# Patient Record
Sex: Male | Born: 1950 | Race: White | Hispanic: No | Marital: Married | Smoking: Former smoker
Health system: Southern US, Community
[De-identification: ages and names within clinical notes are randomized; demographics above are authoritative.]

## PROBLEM LIST (undated history)

## (undated) DIAGNOSIS — H189 Unspecified disorder of cornea: Secondary | ICD-10-CM

## (undated) DIAGNOSIS — N289 Disorder of kidney and ureter, unspecified: Secondary | ICD-10-CM

## (undated) DIAGNOSIS — G4730 Sleep apnea, unspecified: Secondary | ICD-10-CM

## (undated) DIAGNOSIS — I4891 Unspecified atrial fibrillation: Secondary | ICD-10-CM

## (undated) DIAGNOSIS — IMO0001 Reserved for inherently not codable concepts without codable children: Secondary | ICD-10-CM

## (undated) DIAGNOSIS — Z8614 Personal history of Methicillin resistant Staphylococcus aureus infection: Secondary | ICD-10-CM

## (undated) DIAGNOSIS — I509 Heart failure, unspecified: Secondary | ICD-10-CM

## (undated) DIAGNOSIS — J449 Chronic obstructive pulmonary disease, unspecified: Secondary | ICD-10-CM

## (undated) DIAGNOSIS — K8020 Calculus of gallbladder without cholecystitis without obstruction: Secondary | ICD-10-CM

## (undated) DIAGNOSIS — E7800 Pure hypercholesterolemia, unspecified: Secondary | ICD-10-CM

## (undated) DIAGNOSIS — I10 Essential (primary) hypertension: Secondary | ICD-10-CM

## (undated) DIAGNOSIS — F449 Dissociative and conversion disorder, unspecified: Secondary | ICD-10-CM

## (undated) DIAGNOSIS — K219 Gastro-esophageal reflux disease without esophagitis: Secondary | ICD-10-CM

## (undated) DIAGNOSIS — F319 Bipolar disorder, unspecified: Secondary | ICD-10-CM

## (undated) HISTORY — PX: CATARACT EXTRACTION: SUR2

## (undated) HISTORY — PX: CERVICAL FUSION: SHX112

## (undated) HISTORY — PX: KIDNEY STONE SURGERY: SHX686

## (undated) HISTORY — PX: CHOLECYSTECTOMY: SHX55

## (undated) HISTORY — PX: KNEE ARTHROSCOPY: SUR90

---

## 2009-09-04 ENCOUNTER — Encounter (INDEPENDENT_AMBULATORY_CARE_PROVIDER_SITE_OTHER): Payer: Self-pay | Admitting: Internal Medicine

## 2009-09-04 ENCOUNTER — Ambulatory Visit: Payer: Self-pay | Admitting: Vascular Surgery

## 2009-09-05 ENCOUNTER — Ambulatory Visit: Payer: Self-pay | Admitting: Physical Medicine & Rehabilitation

## 2009-10-21 ENCOUNTER — Emergency Department (HOSPITAL_COMMUNITY): Admission: EM | Admit: 2009-10-21 | Discharge: 2009-10-21 | Payer: Self-pay | Admitting: Emergency Medicine

## 2010-05-23 ENCOUNTER — Inpatient Hospital Stay (HOSPITAL_COMMUNITY): Admission: EM | Admit: 2010-05-23 | Discharge: 2009-09-06 | Payer: Self-pay | Admitting: Emergency Medicine

## 2010-09-03 LAB — CBC
HCT: 45.6 % (ref 39.0–52.0)
Hemoglobin: 15.9 g/dL (ref 13.0–17.0)
MCV: 88.1 fL (ref 78.0–100.0)
RBC: 5.18 MIL/uL (ref 4.22–5.81)
WBC: 12.6 10*3/uL — ABNORMAL HIGH (ref 4.0–10.5)

## 2010-09-03 LAB — POCT I-STAT, CHEM 8
Chloride: 103 mEq/L (ref 96–112)
HCT: 49 % (ref 39.0–52.0)
Hemoglobin: 16.7 g/dL (ref 13.0–17.0)

## 2010-09-03 LAB — DIFFERENTIAL
Basophils Absolute: 0 10*3/uL (ref 0.0–0.1)
Basophils Relative: 0 % (ref 0–1)
Eosinophils Absolute: 0.1 10*3/uL (ref 0.0–0.7)
Eosinophils Relative: 1 % (ref 0–5)
Neutrophils Relative %: 79 % — ABNORMAL HIGH (ref 43–77)

## 2010-09-03 LAB — ETHANOL: Alcohol, Ethyl (B): <5 mg/dL (ref 0–10)

## 2010-09-03 LAB — SALICYLATE LEVEL: Salicylate Lvl: <4 mg/dL (ref 2.8–20.0)

## 2010-09-03 LAB — GLUCOSE, CAPILLARY

## 2010-09-09 LAB — IRON AND TIBC
Iron: 80 ug/dL (ref 42–135)
TIBC: 345 ug/dL (ref 215–435)

## 2010-09-09 LAB — ETHANOL: Alcohol, Ethyl (B): <5 mg/dL (ref 0–10)

## 2010-09-09 LAB — HEMOGLOBIN A1C
Hgb A1c MFr Bld: 10 % — ABNORMAL HIGH (ref 4.6–6.1)
Mean Plasma Glucose: 240 mg/dL

## 2010-09-09 LAB — DIFFERENTIAL
Basophils Relative: 0 % (ref 0–1)
Eosinophils Absolute: 0.3 10*3/uL (ref 0.0–0.7)
Eosinophils Relative: 3 % (ref 0–5)
Lymphocytes Relative: 19 % (ref 12–46)
Lymphs Abs: 1.9 10*3/uL (ref 0.7–4.0)
Monocytes Relative: 6 % (ref 3–12)
Neutro Abs: 7.4 10*3/uL (ref 1.7–7.7)
Neutrophils Relative %: 72 % (ref 43–77)

## 2010-09-09 LAB — GLUCOSE, CAPILLARY
Glucose-Capillary: 126 mg/dL — ABNORMAL HIGH (ref 70–99)
Glucose-Capillary: 162 mg/dL — ABNORMAL HIGH (ref 70–99)
Glucose-Capillary: 216 mg/dL — ABNORMAL HIGH (ref 70–99)
Glucose-Capillary: 219 mg/dL — ABNORMAL HIGH (ref 70–99)
Glucose-Capillary: 246 mg/dL — ABNORMAL HIGH (ref 70–99)
Glucose-Capillary: 254 mg/dL — ABNORMAL HIGH (ref 70–99)
Glucose-Capillary: 284 mg/dL — ABNORMAL HIGH (ref 70–99)
Glucose-Capillary: 313 mg/dL — ABNORMAL HIGH (ref 70–99)
Glucose-Capillary: 341 mg/dL — ABNORMAL HIGH (ref 70–99)

## 2010-09-09 LAB — URINALYSIS, ROUTINE W REFLEX MICROSCOPIC
Bilirubin Urine: NEGATIVE
Glucose, UA: NEGATIVE mg/dL
Hgb urine dipstick: NEGATIVE
Nitrite: NEGATIVE
Protein, ur: NEGATIVE mg/dL

## 2010-09-09 LAB — RETICULOCYTES: Retic Count, Absolute: 59.9 10*3/uL (ref 19.0–186.0)

## 2010-09-09 LAB — COMPREHENSIVE METABOLIC PANEL
ALT: 30 U/L (ref 0–53)
AST: 19 U/L (ref 0–37)
CO2: 24 mEq/L (ref 19–32)
Calcium: 8.9 mg/dL (ref 8.4–10.5)
Creatinine, Ser: 0.87 mg/dL (ref 0.4–1.5)
GFR calc Af Amer: >60 mL/min (ref >60)
GFR calc non Af Amer: >60 mL/min (ref >60)
Sodium: 143 mEq/L (ref 135–145)
Total Protein: 6.9 g/dL (ref 6.0–8.3)

## 2010-09-09 LAB — HOMOCYSTEINE
Homocysteine: 6.1 umol/L (ref 4.0–15.4)
Homocysteine: 8.2 umol/L (ref 4.0–15.4)

## 2010-09-09 LAB — VITAMIN B12: Vitamin B-12: 510 pg/mL (ref 211–911)

## 2010-09-09 LAB — CK TOTAL AND CKMB (NOT AT ARMC)
CK, MB: 1.1 ng/mL (ref 0.3–4.0)
CK, MB: 1.2 ng/mL (ref 0.3–4.0)
Relative Index: INVALID (ref 0.0–2.5)
Relative Index: INVALID (ref 0.0–2.5)
Total CK: 43 U/L (ref 7–232)
Total CK: 43 U/L (ref 7–232)
Total CK: 49 U/L (ref 7–232)

## 2010-09-09 LAB — CBC
Hemoglobin: 15.3 g/dL (ref 13.0–17.0)
MCHC: 34 g/dL (ref 30.0–36.0)
MCV: 89.3 fL (ref 78.0–100.0)
Platelets: 292 10*3/uL (ref 150–400)
WBC: 8.5 10*3/uL (ref 4.0–10.5)

## 2010-09-09 LAB — BASIC METABOLIC PANEL
BUN: 11 mg/dL (ref 6–23)
CO2: 25 mEq/L (ref 19–32)
Calcium: 8.7 mg/dL (ref 8.4–10.5)
Chloride: 107 mEq/L (ref 96–112)
Creatinine, Ser: 0.84 mg/dL (ref 0.4–1.5)

## 2010-09-09 LAB — POCT I-STAT, CHEM 8
Chloride: 109 mEq/L (ref 96–112)
Glucose, Bld: 124 mg/dL — ABNORMAL HIGH (ref 70–99)
Hemoglobin: 15.3 g/dL (ref 13.0–17.0)
Potassium: 3.6 mEq/L (ref 3.5–5.1)

## 2010-09-09 LAB — TROPONIN I
Troponin I: 0.01 ng/mL (ref 0.00–0.06)
Troponin I: <0.01 ng/mL (ref 0.00–0.06)

## 2010-09-09 LAB — APTT: aPTT: 28 seconds (ref 24–37)

## 2010-09-09 LAB — LIPID PANEL: Cholesterol: 124 mg/dL (ref 0–200)

## 2010-09-09 LAB — POCT CARDIAC MARKERS

## 2011-03-17 ENCOUNTER — Emergency Department (INDEPENDENT_AMBULATORY_CARE_PROVIDER_SITE_OTHER): Payer: Managed Care, Other (non HMO)

## 2011-03-17 ENCOUNTER — Encounter: Payer: Self-pay | Admitting: Family Medicine

## 2011-03-17 ENCOUNTER — Emergency Department (HOSPITAL_BASED_OUTPATIENT_CLINIC_OR_DEPARTMENT_OTHER)
Admission: EM | Admit: 2011-03-17 | Discharge: 2011-03-17 | Disposition: A | Discharge status: Home / Self Care | Payer: Managed Care, Other (non HMO) | Attending: Emergency Medicine | Admitting: Emergency Medicine

## 2011-03-17 DIAGNOSIS — Z794 Long term (current) use of insulin: Secondary | ICD-10-CM | POA: Insufficient documentation

## 2011-03-17 DIAGNOSIS — I10 Essential (primary) hypertension: Secondary | ICD-10-CM | POA: Insufficient documentation

## 2011-03-17 DIAGNOSIS — Y92009 Unspecified place in unspecified non-institutional (private) residence as the place of occurrence of the external cause: Secondary | ICD-10-CM | POA: Insufficient documentation

## 2011-03-17 DIAGNOSIS — E119 Type 2 diabetes mellitus without complications: Secondary | ICD-10-CM | POA: Insufficient documentation

## 2011-03-17 DIAGNOSIS — W010XXA Fall on same level from slipping, tripping and stumbling without subsequent striking against object, initial encounter: Secondary | ICD-10-CM | POA: Insufficient documentation

## 2011-03-17 DIAGNOSIS — M25569 Pain in unspecified knee: Secondary | ICD-10-CM | POA: Insufficient documentation

## 2011-03-17 DIAGNOSIS — G4730 Sleep apnea, unspecified: Secondary | ICD-10-CM | POA: Insufficient documentation

## 2011-03-17 DIAGNOSIS — S62109A Fracture of unspecified carpal bone, unspecified wrist, initial encounter for closed fracture: Secondary | ICD-10-CM

## 2011-03-17 DIAGNOSIS — W19XXXA Unspecified fall, initial encounter: Secondary | ICD-10-CM

## 2011-03-17 DIAGNOSIS — M25539 Pain in unspecified wrist: Secondary | ICD-10-CM

## 2011-03-17 HISTORY — DX: Dissociative and conversion disorder, unspecified: F44.9

## 2011-03-17 HISTORY — DX: Essential (primary) hypertension: I10

## 2011-03-17 HISTORY — DX: Unspecified disorder of cornea: H18.9

## 2011-03-17 HISTORY — DX: Bipolar disorder, unspecified: F31.9

## 2011-03-17 HISTORY — DX: Sleep apnea, unspecified: G47.30

## 2011-03-17 MED ORDER — HYDROMORPHONE HCL 1 MG/ML IJ SOLN
1.0000 mg | Freq: Once | INTRAMUSCULAR | Status: AC
  Administered 2011-03-17: 1 mg via INTRAMUSCULAR
  Filled 2011-03-17: qty 1

## 2011-03-17 MED ORDER — OXYCODONE-ACETAMINOPHEN 5-325 MG PO TABS: 2.0000 | ORAL_TABLET | ORAL | Status: AC | PRN

## 2011-03-17 NOTE — ED Notes (Signed)
Pt sts he slipped in "oil in carport today and fell". Pt c/o right wrist pain, right shoulder and arm pain. Pt denies loc.

## 2011-03-17 NOTE — ED Provider Notes (Addendum)
History   Chart scribed for Raeford Razor, MD by Enos Fling; the patient was seen in room MH08/MH08; this patient's care was started at 6:26 PM.    CSN: [id number] Arrival date & time: 03/17/2011  6:23 PM  Chief Complaint  Patient presents with  . Fall   HPI Mike Hunter is a 60 y.o. male who presents to the Emergency Department s/p fall. Pt reports he slipped and fell on oil today a brief time pta while outside in his carport. Pt c/o persistent "bad" right wrist pain, radiating up his arm to his shoulder, worse with movement and palpation. Also c/o pain to left knee that is worse with walking. No numbness, tingling, or weakness. No head injury or LOC with fall. Denies headache, neck pain, back pain, dizziness, or abd pain. Pt has not taken any pain meds.    Past Medical History  Diagnosis Date  . Diabetes mellitus   . Hypertension   . Sleep apnea   . Asthma   . Bipolar affective   . Conversion disorder   . Cornea disorder   . Cataract     Past Surgical History  Procedure Date  . Knee arthroscopy   . Cholecystectomy   . Kidney stone surgery     No family history on file.  History  Substance Use Topics  . Smoking status: Never Smoker   . Smokeless tobacco: Not on file  . Alcohol Use: No      Review of Systems 10 Systems reviewed and are negative for acute change except as noted in the HPI.  Allergies  Codeine and Morphine and related  Home Medications   Current Outpatient Rx  Name Route Sig Dispense Refill  . INSULIN ASPART 100 UNIT/ML [place] SOLN Subcutaneous Inject 15-35 Units into the skin 3 (three) times daily before meals. Use sliding scale     . INSULIN GLARGINE 100 UNIT/ML [place] SOLN Subcutaneous Inject 45-50 Units into the skin 2 (two) times daily. Give 50 units in the morning and 45 units at supper time     . METFORMIN HCL 1000 MG PO TABS Oral Take 1,000 mg by mouth 2 (two) times daily with a meal.        BP 147/83  Pulse 96  Temp(Src) 98.1 F (36.7  C) (Oral)  Resp 18  Ht 5\' 11"  (1.803 m)  Wt 280 lb (127.007 kg)  BMI 39.05 kg/m2  SpO2 100%  Physical Exam  Nursing note and vitals reviewed. Constitutional: He is oriented to person, place, and time. He appears well-developed and well-nourished. No distress.  HENT:  Head: Normocephalic and atraumatic.  Mouth/Throat: Mucous membranes are normal.  Eyes: Conjunctivae are normal.  Neck: Normal range of motion. Neck supple.  Cardiovascular: Normal rate, regular rhythm and intact distal pulses.  Exam reveals no gallop and no friction rub.   No murmur heard. Pulmonary/Chest: Effort normal and breath sounds normal. He has no wheezes. He has no rales.  Abdominal: Soft. There is no tenderness.  Musculoskeletal: Normal range of motion. He exhibits no edema.       Subjective tenderness diffusely to right hand and wrist but nontender while distracted, uses right hand to support himself when moving on the bed, no significant swelling to right hand or wrist; Left knee, right elbow, and right shoulder nontender with no obvious swelling or ecchymosis; FROM, and NVI all extremities  Neurological: He is alert and oriented to person, place, and time.  Skin: Skin is warm and dry.  No rash noted.  Psychiatric: He has a normal mood and affect.    ED Course  Procedures - none  Dg Wrist Complete Right  03/17/2011  *RADIOLOGY REPORT*  Clinical Data: Fall, wrist pain  RIGHT WRIST - COMPLETE 3+ VIEW  Comparison: 03/17/2011  Findings: Intact distal radius and ulna.  Normal wrist alignment. On the lateral view, there is dorsal soft tissue swelling with a small bone fragment in the wrist region consistent with a avulsion fracture from the dorsal surface of the carpal bones, suspect from the triquetrum.  IMPRESSION: Dorsal carpal bone small avulsion fracture with localized soft tissue swelling, suspect from the triquetrum.  Original Report Authenticated By: Judie Petit. Ruel Favors, M.D.   Dg Hand Complete  Right  03/17/2011  *RADIOLOGY REPORT*  Clinical Data: Fall, right hand pain, swelling  RIGHT HAND - COMPLETE 3+ VIEW  Comparison: 03/17/2011  Findings: Slight arthritic changes of the right fifth PIP joint. Normal alignment.  Metacarpals and phalanges appear intact.  Distal radius and ulna intact. On the lateral view, there is a small bone fragment dorsally at the wrist with localized soft tissue swelling suspicious for a small avulsion fracture from the dorsal surface of one of the carpal bones, suspect from the triquetrum.  IMPRESSION: Small acute avulsion fracture dorsally at the wrist involving the carpal bones, suspect from the triquetrum.  Original Report Authenticated By: Judie Petit. Ruel Favors, M.D.    OTHER DATA REVIEWED: Nursing notes and vital signs reviewed. Prior records reviewed.  All results reviewed and discussed, questions answered, pt agreeable with plan.   MDM  60yM with R hand/wrist pain after fall. Avulsion fx of carpal bone, likely triquetrum. Physical exam consistent with that location. Will place in thumb spica and refer to ortho.  IMPRESSION: No diagnosis found.  SCRIBE ATTESTATION: I personally preformed the services scribed in my presence. The recorded information has been reviewed and considered. Raeford Razor, MD.        Raeford Razor, MD 03/17/11 [id number]  Raeford Razor, MD 03/17/11 [phone]

## 2011-05-13 ENCOUNTER — Emergency Department (HOSPITAL_BASED_OUTPATIENT_CLINIC_OR_DEPARTMENT_OTHER)
Admission: EM | Admit: 2011-05-13 | Discharge: 2011-05-13 | Disposition: A | Discharge status: Home / Self Care | Payer: Managed Care, Other (non HMO) | Attending: Emergency Medicine | Admitting: Emergency Medicine

## 2011-05-13 ENCOUNTER — Encounter (HOSPITAL_BASED_OUTPATIENT_CLINIC_OR_DEPARTMENT_OTHER): Payer: Self-pay | Admitting: *Deleted

## 2011-05-13 ENCOUNTER — Emergency Department (HOSPITAL_BASED_OUTPATIENT_CLINIC_OR_DEPARTMENT_OTHER)
Admission: EM | Admit: 2011-05-13 | Discharge: 2011-05-14 | Disposition: A | Discharge status: Other Acute Inpatient Hospital | Payer: Managed Care, Other (non HMO) | Attending: Emergency Medicine | Admitting: Emergency Medicine

## 2011-05-13 ENCOUNTER — Encounter (HOSPITAL_BASED_OUTPATIENT_CLINIC_OR_DEPARTMENT_OTHER): Payer: Self-pay | Admitting: Emergency Medicine

## 2011-05-13 DIAGNOSIS — I10 Essential (primary) hypertension: Secondary | ICD-10-CM | POA: Insufficient documentation

## 2011-05-13 DIAGNOSIS — G4730 Sleep apnea, unspecified: Secondary | ICD-10-CM | POA: Insufficient documentation

## 2011-05-13 DIAGNOSIS — J45909 Unspecified asthma, uncomplicated: Secondary | ICD-10-CM | POA: Insufficient documentation

## 2011-05-13 DIAGNOSIS — F319 Bipolar disorder, unspecified: Secondary | ICD-10-CM | POA: Insufficient documentation

## 2011-05-13 DIAGNOSIS — E119 Type 2 diabetes mellitus without complications: Secondary | ICD-10-CM | POA: Insufficient documentation

## 2011-05-13 DIAGNOSIS — I213 ST elevation (STEMI) myocardial infarction of unspecified site: Secondary | ICD-10-CM

## 2011-05-13 DIAGNOSIS — R040 Epistaxis: Secondary | ICD-10-CM | POA: Insufficient documentation

## 2011-05-13 DIAGNOSIS — I219 Acute myocardial infarction, unspecified: Secondary | ICD-10-CM | POA: Insufficient documentation

## 2011-05-13 HISTORY — DX: Calculus of gallbladder without cholecystitis without obstruction: K80.20

## 2011-05-13 HISTORY — DX: Reserved for inherently not codable concepts without codable children: IMO0001

## 2011-05-13 HISTORY — DX: Gastro-esophageal reflux disease without esophagitis: K21.9

## 2011-05-13 MED ORDER — OXYMETAZOLINE HCL 0.05 % NA SOLN
1.0000 | Freq: Once | NASAL | Status: AC
  Administered 2011-05-14: 1 via NASAL
  Filled 2011-05-13: qty 15

## 2011-05-13 MED ORDER — OXYMETAZOLINE HCL 0.05 % NA SOLN
NASAL | Status: AC
  Filled 2011-05-13: qty 15

## 2011-05-13 NOTE — ED Notes (Signed)
Nose bleed started this am at approximately 0300.  Patient states he has a past history of same.  Uses cpap at night for severe sleep apnea.  Moderate amount of bleeding from right nare.

## 2011-05-13 NOTE — ED Notes (Signed)
Pt has bleeding from right nare. Pt has nasal tampon in right nare from here this morning.

## 2011-05-13 NOTE — ED Provider Notes (Signed)
History     CSN: [id number] Arrival date & time: 05/13/2011  7:51 AM   First MD Initiated Contact with Patient 05/13/11 0801      No chief complaint on file.   (Consider location/radiation/quality/duration/timing/severity/associated sxs/prior treatment) HPI Comments: Patient denies any nasal pain, shortness of breath, or headache. Patient states he has had nasal bleeds like this in the past but this is the worst it has never bled for this long.  Patient is a 60 y.o. male presenting with nosebleeds. The history is provided by the patient.  Epistaxis  This is a recurrent problem. The current episode started 3 to 5 hours ago. The problem occurs constantly. The problem has not changed since onset.The problem is associated with an unknown (States he did wear his CPAP mask last night in the humidifier was not attached) factor. The bleeding has been from the right nare. Treatments tried: Patient has tried applying pressure and packing his nose with some gauze without any improving. The treatment provided no relief.    Past Medical History  Diagnosis Date  . Diabetes mellitus   . Hypertension   . Sleep apnea   . Asthma   . Bipolar affective   . Conversion disorder   . Cornea disorder   . Cataract     Past Surgical History  Procedure Date  . Knee arthroscopy   . Cholecystectomy   . Kidney stone surgery     No family history on file.  History  Substance Use Topics  . Smoking status: Never Smoker   . Smokeless tobacco: Not on file  . Alcohol Use: No      Review of Systems  Constitutional: Negative for fever.  HENT: Positive for ear pain and nosebleeds.   Respiratory: Negative for cough and shortness of breath.   Gastrointestinal: Negative for nausea.  Neurological: Negative for weakness.  Hematological: Does not bruise/bleed easily.  All other systems reviewed and are negative.    Allergies  Codeine and Morphine and related  Home Medications   Current Outpatient  Rx  Name Route Sig Dispense Refill  . FUROSEMIDE 20 MG PO TABS Oral Take 20 mg by mouth daily.      . INSULIN ASPART 100 UNIT/ML [place] SOLN Subcutaneous Inject 15-35 Units into the skin 3 (three) times daily before meals. Use sliding scale     . INSULIN GLARGINE 100 UNIT/ML [place] SOLN Subcutaneous Inject 45-50 Units into the skin 2 (two) times daily. Give 50 units in the morning and 45 units at supper time     . L-METHYLFOLATE CALCIUM 7.5 MG PO TABS Oral Take 1 tablet by mouth daily.      Marland Kitchen LANSOPRAZOLE 30 MG PO CPDR Oral Take 30 mg by mouth 2 (two) times daily.      Marland Kitchen LATANOPROST 0.005 % OP SOLN Both Eyes Place 1 drop into both eyes at bedtime.      Marland Kitchen METFORMIN HCL 1000 MG PO TABS Oral Take 1,000 mg by mouth 2 (two) times daily with a meal.      . MONTELUKAST SODIUM 10 MG PO TABS Oral Take 10 mg by mouth at bedtime.      . OXYBUTYNIN CHLORIDE ER 10 MG PO TB24 Oral Take 10 mg by mouth daily.      Marland Kitchen PENTOSAN POLYSULFATE SODIUM 100 MG PO CAPS Oral Take 200 mg by mouth 2 (two) times daily.      Marland Kitchen VILAZODONE HCL 40 MG PO TABS Oral Take 1 tablet by mouth  daily.        There were no vitals taken for this visit.  Physical Exam  Nursing note and vitals reviewed. Constitutional: He is oriented to person, place, and time. He appears well-developed and well-nourished. He appears distressed.  HENT:  Head: Normocephalic and atraumatic.  Right Ear: Tympanic membrane and ear canal normal.  Left Ear: Tympanic membrane and ear canal normal.  Nose: Epistaxis is observed.       Right nare with fresh clots in oozing blood. No bleeding from the left nare.  Blood in the posterior pharynx.  Eyes: Conjunctivae and EOM are normal. Pupils are equal, round, and reactive to light.  Neck: Normal range of motion. Neck supple.  Cardiovascular: Normal rate, normal heart sounds and intact distal pulses.   No murmur heard. Pulmonary/Chest: Breath sounds normal. No respiratory distress. He has no wheezes. He has no rales.    Musculoskeletal: He exhibits no tenderness.  Neurological: He is alert and oriented to person, place, and time.  Skin: Skin is warm and dry. No rash noted. No erythema. No pallor.  Psychiatric: He has a normal mood and affect. His behavior is normal.    ED Course  EPISTAXIS MANAGEMENT Date/Time: 05/13/2011 8:08 AM Performed by: Gwyneth Sprout Authorized by: Gwyneth Sprout Consent: The procedure was performed in an emergent situation. Required items: required blood products, implants, devices, and special equipment available Patient identity confirmed: verbally with patient Patient sedated: no Treatment site: right anterior Repair method: nasal balloon (7.5 Rhino Rocket) Post-procedure assessment: bleeding stopped Treatment complexity: simple Patient tolerance: Patient tolerated the procedure well with no immediate complications.   (including critical care time)  Labs Reviewed - No data to display No results found.   No diagnosis found.    MDM   Patient presented with a nosebleed. He states he has had nosebleeds in the past recently he was seen by ENT 2 weeks ago and had right nare are cauterized.  Here patient has profuse bleeding out of the right side of his nose after removing the clots I blowing his nose patient's nare was packed with a 7.5 Rhino Rocket.  Patient is not on any anticoagulation and he was observed for 20-30 minutes without any further bleeding. Will have patient followup with his ENT for packing removal and further management.        Gwyneth Sprout, MD 05/13/11 [phone]

## 2011-05-14 LAB — CBC
HCT: 36.5 % — ABNORMAL LOW (ref 39.0–52.0)
Hemoglobin: 12.4 g/dL — ABNORMAL LOW (ref 13.0–17.0)
MCV: 86.7 fL (ref 78.0–100.0)
RBC: 4.21 MIL/uL — ABNORMAL LOW (ref 4.22–5.81)
WBC: 9 10*3/uL (ref 4.0–10.5)

## 2011-05-14 LAB — BASIC METABOLIC PANEL
BUN: 18 mg/dL (ref 6–23)
CO2: 20 mEq/L (ref 19–32)
Chloride: 105 mEq/L (ref 96–112)
Creatinine, Ser: 0.7 mg/dL (ref 0.50–1.35)
Potassium: 3.9 mEq/L (ref 3.5–5.1)

## 2011-05-14 MED ORDER — HEPARIN SOD (PORCINE) IN D5W 100 UNIT/ML IV SOLN
INTRAVENOUS | Status: AC
  Filled 2011-05-14: qty 250

## 2011-05-14 MED ORDER — MORPHINE SULFATE 2 MG/ML IJ SOLN
INTRAMUSCULAR | Status: AC
  Administered 2011-05-14: 2 mg via INTRAVENOUS
  Filled 2011-05-14: qty 1

## 2011-05-14 MED ORDER — ONDANSETRON HCL 4 MG/2ML IJ SOLN
4.0000 mg | Freq: Once | INTRAMUSCULAR | Status: AC
  Administered 2011-05-14: 4 mg via INTRAVENOUS
  Filled 2011-05-14: qty 2

## 2011-05-14 MED ORDER — HEPARIN BOLUS VIA INFUSION
4000.0000 [IU] | Freq: Once | INTRAVENOUS | Status: DC
  Filled 2011-05-14: qty 4000

## 2011-05-14 MED ORDER — HEPARIN SODIUM (PORCINE) 5000 UNIT/ML IJ SOLN
INTRAMUSCULAR | Status: AC
  Administered 2011-05-14: 4000 [IU]
  Filled 2011-05-14: qty 1

## 2011-05-14 MED ORDER — SODIUM CHLORIDE 0.9 % IV SOLN
INTRAVENOUS | Status: DC
  Administered 2011-05-14: via INTRAVENOUS

## 2011-05-14 MED ORDER — MORPHINE SULFATE 4 MG/ML IJ SOLN
INTRAMUSCULAR | Status: AC
  Administered 2011-05-14: 4 mg
  Filled 2011-05-14: qty 1

## 2011-05-14 MED ORDER — ASPIRIN 325 MG PO TABS
325.0000 mg | ORAL_TABLET | Freq: Once | ORAL | Status: AC
  Administered 2011-05-14: 325 mg via ORAL
  Filled 2011-05-14: qty 1

## 2011-05-14 NOTE — ED Notes (Addendum)
Dr Patria Mane notified of changes in pain status.  Repeat 12 lead ordered

## 2011-05-14 NOTE — ED Notes (Signed)
Pt was placed on a Ventri mask of 40% per nose bleed (packing placed in nose) and some active CP. Pt also had multiple EKG strips taken to assess the CP during the nose bleed event. Suction was placed at bedside.

## 2011-05-14 NOTE — ED Notes (Signed)
Pt presents to ED today c/o nose bleed.  Pt was here earlier for same and treateda nd sent home.  Pt has profuse bleeding from both nares.  Dr Patria Mane at bedside

## 2011-05-14 NOTE — ED Notes (Addendum)
IV remain indwelling for transport to [facility],  Charted as removed for [facility].

## 2011-05-14 NOTE — ED Provider Notes (Signed)
History     CSN: [id number] Arrival date & time: 05/13/2011 11:47 PM   First MD Initiated Contact with Patient 05/13/11 2348      Chief Complaint  Patient presents with  . Epistaxis    (Consider location/radiation/quality/duration/timing/severity/associated sxs/prior treatment) The history is provided by the patient and the spouse.   the patient reports he did develop nosebleed from his right side earlier this morning which point he was seen in the emergency department and they Rhino Rocket was placed in his right nares to control his bleeding.  He reports a call is her nose and throat surgeon however followup with [facility] range until Friday.  Patient reported his bleeding then began again in approximately 4 this afternoon.  He reports his been intermittent since then he feels as though the blood running down the back of his throat.  His been nauseated however has not vomited.  He also reports intermittent chest tightness across his anterior chest without radiation that began tonight abruptly around 6 PM.  This is occurred intermittently since then and is now severe.  He reports associated shortness of breath and nausea.  His had no diaphoresis.  He reports multiple heart catheterizations given his risk factors who reports he does have coronary artery disease however there is never been enough to stent.  He reports that recently he has developed "angina" with some chest pain with exertion.  At time of evaluation after nosebleed was controlled he reported 6/10 chest discomfort described as a tightness and pressure as if someone had a fist in his chest.   Past Medical History  Diagnosis Date  . Diabetes mellitus   . Hypertension   . Sleep apnea   . Asthma   . Bipolar affective   . Conversion disorder   . Cornea disorder   . Cataract   . Reflux   . 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 Knee arthroscopy   . Cholecystectomy   . Kidney stone  surgery     No family history on file.  History  Substance Use Topics  . Smoking status: Former Games developer  . Smokeless tobacco: Not on file  . Alcohol Use: No      Review of Systems  HENT: Positive for nosebleeds.   All other systems reviewed and are negative.    Allergies  Codeine and Morphine and related  Home Medications   Current Outpatient Rx  Name Route Sig Dispense Refill  . FUROSEMIDE 20 MG PO TABS Oral Take 20 mg by mouth daily.      . INSULIN ASPART 100 UNIT/ML [place] SOLN Subcutaneous Inject 15-35 Units into the skin 3 (three) times daily before meals. Use sliding scale     . INSULIN GLARGINE 100 UNIT/ML [place] SOLN Subcutaneous Inject 45-50 Units into the skin 2 (two) times daily. Give 50 units in the morning and 45 units at supper time     . L-METHYLFOLATE CALCIUM 7.5 MG PO TABS Oral Take 1 tablet by mouth daily.     Marland Kitchen LANSOPRAZOLE 30 MG PO CPDR Oral Take 30 mg by mouth 2 (two) times daily.      Marland Kitchen LATANOPROST 0.005 % [place] SOLN Both Eyes Place 1 drop into both eyes at bedtime.      Marland Kitchen METFORMIN HCL 1000 MG PO TABS Oral Take 1,000 mg by mouth 2 (two) times daily with a meal.      . MONTELUKAST SODIUM  10 MG PO TABS Oral Take 10 mg by mouth at bedtime.      . OXYBUTYNIN CHLORIDE ER 10 MG PO TB24 Oral Take 10 mg by mouth daily.      Marland Kitchen PENTOSAN POLYSULFATE SODIUM 100 MG PO CAPS Oral Take 200 mg by mouth 2 (two) times daily.      Marland Kitchen VILAZODONE HCL 40 MG PO TABS Oral Take 1 tablet by mouth daily.        BP 157/87  Pulse 97  Temp(Src) 97.4 F (36.3 C) (Oral)  Resp 18  SpO2 100%  Physical Exam  Nursing note and vitals reviewed. Constitutional: He is oriented to person, place, and time. He appears well-developed and well-nourished. He appears distressed.  HENT:  Head: Normocephalic and atraumatic.       Diffuse bleeding from his bilateral nares right greater than left.  Rhino Rocket inflated in his right neck.  The patient does appear to have blood in his posterior pharynx as  well and currently he is coughing up blood.  Eyes: EOM are normal.  Neck: Normal range of motion.  Cardiovascular: Normal rate, regular rhythm, normal heart sounds and intact distal pulses.   Pulmonary/Chest: Effort normal and breath sounds normal. No respiratory distress.  Abdominal: Soft. He exhibits no distension. There is no tenderness.  Musculoskeletal: Normal range of motion.  Neurological: He is alert and oriented to person, place, and time.  Skin: Skin is warm and dry. He is not diaphoretic.  Psychiatric:       Anxious appearing    ED Course  EPISTAXIS MANAGEMENT Performed by: Lyanne Co Authorized by: Lyanne Co Consent: Verbal consent obtained. Risks and benefits: risks, benefits and alternatives were discussed Consent given by: patient Required items: required blood products, implants, devices, and special equipment available Patient identity confirmed: verbally with patient Patient sedated: no Treatment site: right posterior Repair method: suction and merocel sponge Post-procedure assessment: bleeding stopped Treatment complexity: simple Patient tolerance: Patient tolerated the procedure well with no immediate complications.  EPISTAXIS MANAGEMENT Performed by: Lyanne Co Authorized by: Lyanne Co Risks and benefits: risks, benefits and alternatives were discussed Consent given by: patient Required items: required blood products, implants, devices, and special equipment available Patient identity confirmed: verbally with patient Patient sedated: no Treatment site: left posterior Repair method: merocel sponge Treatment complexity: simple Patient tolerance: Patient tolerated the procedure well with no immediate complications.   CRITICAL CARE Performed by: Lyanne Co   Total critical care time: 40  Critical care time was exclusive of separately billable procedures and treating other patients.  Critical care was necessary to treat or  prevent imminent or life-threatening deterioration.  Critical care was time spent personally by me on the following activities: development of treatment plan with patient and/or surrogate as well as nursing, discussions with consultants, evaluation of patient's response to treatment, examination of patient, obtaining history from patient or surrogate, ordering and performing treatments and interventions, ordering and review of laboratory studies, ordering and review of radiographic studies, pulse oximetry and re-evaluation of patient's condition.   Date: 05/14/2011  Rate: 96  Rhythm: normal sinus rhythm  QRS Axis: normal  Intervals: normal  ST/T Wave abnormalities: Inferior ST elevation with ST depression in I and AVL  Conduction Disutrbances:none  Narrative Interpretation:   Old EKG Reviewed: Changed from prior ecg      Labs Reviewed  CBC - Abnormal; Notable for the following:    RBC 4.21 (*)    Hemoglobin 12.4 (*)  HCT 36.5 (*)    All other components within normal limits  BASIC METABOLIC PANEL - Abnormal; Notable for the following:    Glucose, Bld 256 (*)    All other components within normal limits  TROPONIN I   No results found.   1. STEMI (ST elevation myocardial infarction)   2. Epistaxis       MDM  The patient presented with significant active bleeding from his bilateral nares right greater than left.  His packing was removed and all blood clots were removed by asking the patient to blow his nose into a towel.  Afrin was then sprayed into his right nostril and Murocel packing was placed into the right nare with control of the majority of bleeding.  He continued to have a small amount of bleeding coming out of his left nostril and thus a second Murocel packing was placed in his left nostril.  This appeared to completely control patient's bleeding and he had no longer evidence of posterior bleeding and was no longer coughing up blood.  The patient was nauseated he was  given Zofran and IV was placed the patient was placed on the monitor. Labs were sent. It is at this point in the evaluation that the patient begin to tell me about his chest pain.  Initial EKG was concerning for ST elevation MI with approximately 1-1/2 mm of ST elevation in his inferior leads mainly in lead 2 with what appeared to be ST depression in I and AVL.  A thorough history was obtained to evaluate for possible ACS symptomatology and at this point he began telling me more about his chest pain his prior cardiac history has intermittent chest tightness the day occurring throughout the last several hours.  At this time the code STEMI was called and the patients case was discussed with Dr. Sherrie Mustache the cardiologist on-call [facility] at 12:30AM.  At the time the scope was being made the patient's pain in his chest it resolved without intervention it appeared as though his ST segments were improving concerning for dynamic EKG changes.  During the current conversation with the cardiologist who agreed to accept the patient is possible could STEMI to the hyper regional ER the patient began having severe chest pain again.  His repeat EKG was unchanged.  I had a discussion with the cardiologist regarding heparinization of this patient given his active nosebleed but now seem to be controlled with new Murocel bilateral packings in place.  We discussed the case and deemed that a heparin bolus without drip of 4000 units be appropriate as this can always be reversed, and at this point it appears that his bleeding was controlled and the patient had what appeared to be ST elevation MI on his EKG.  The patient was transferred emergently to [facility] (the hospital of his cardiologist) by [facility].  The patient was given an aspirin and morphine for his pain.  He was not started on nitroglycerin drip as this has not been shown to improve mortality in ST elevation MI and I was concerned about the  possibility of hypotension in route given his bleeding throughout the day despite a normal hemoglobin at this time.  A troponin was sent as well but was not back at the time of transfer.  The patient was transferred in stable condition with copies of his EKGs as well as a CBC a BMP which demonstrated a normal hemoglobin and normal BUN and creatinine.  The patient  was taken emergency traffic.  His family was updated.  The clinical concern was for possible plaque rupture as the patient has had a catheter demonstrating nonobstructive coronary disease, possibly from increased catecholamine rush secondary to severe epistaxis        Lyanne Co, MD 05/14/11 [phone]

## 2011-05-23 ENCOUNTER — Other Ambulatory Visit: Payer: Self-pay | Admitting: Orthopedic Surgery

## 2011-05-23 ENCOUNTER — Ambulatory Visit
Admission: RE | Admit: 2011-05-23 | Discharge: 2011-05-23 | Disposition: A | Discharge status: Home / Self Care | Payer: Managed Care, Other (non HMO) | Source: Ambulatory Visit | Attending: Orthopedic Surgery | Admitting: Orthopedic Surgery

## 2011-05-23 DIAGNOSIS — S62109A Fracture of unspecified carpal bone, unspecified wrist, initial encounter for closed fracture: Secondary | ICD-10-CM

## 2012-02-05 ENCOUNTER — Emergency Department (HOSPITAL_BASED_OUTPATIENT_CLINIC_OR_DEPARTMENT_OTHER): Payer: No Typology Code available for payment source

## 2012-02-05 ENCOUNTER — Emergency Department (HOSPITAL_BASED_OUTPATIENT_CLINIC_OR_DEPARTMENT_OTHER)
Admission: EM | Admit: 2012-02-05 | Discharge: 2012-02-05 | Disposition: A | Discharge status: Home / Self Care | Payer: No Typology Code available for payment source | Attending: Emergency Medicine | Admitting: Emergency Medicine

## 2012-02-05 ENCOUNTER — Encounter (HOSPITAL_BASED_OUTPATIENT_CLINIC_OR_DEPARTMENT_OTHER): Payer: Self-pay | Admitting: Emergency Medicine

## 2012-02-05 DIAGNOSIS — I10 Essential (primary) hypertension: Secondary | ICD-10-CM | POA: Insufficient documentation

## 2012-02-05 DIAGNOSIS — S20219A Contusion of unspecified front wall of thorax, initial encounter: Secondary | ICD-10-CM | POA: Insufficient documentation

## 2012-02-05 DIAGNOSIS — Z87891 Personal history of nicotine dependence: Secondary | ICD-10-CM | POA: Insufficient documentation

## 2012-02-05 DIAGNOSIS — Z794 Long term (current) use of insulin: Secondary | ICD-10-CM | POA: Insufficient documentation

## 2012-02-05 DIAGNOSIS — Z885 Allergy status to narcotic agent status: Secondary | ICD-10-CM | POA: Insufficient documentation

## 2012-02-05 DIAGNOSIS — E119 Type 2 diabetes mellitus without complications: Secondary | ICD-10-CM | POA: Insufficient documentation

## 2012-02-05 DIAGNOSIS — W1789XA Other fall from one level to another, initial encounter: Secondary | ICD-10-CM | POA: Insufficient documentation

## 2012-02-05 DIAGNOSIS — K219 Gastro-esophageal reflux disease without esophagitis: Secondary | ICD-10-CM | POA: Insufficient documentation

## 2012-02-05 MED ORDER — HYDROCODONE-ACETAMINOPHEN 5-325 MG PO TABS
2.0000 | ORAL_TABLET | ORAL | Status: AC | PRN
Start: 2012-02-05 — End: 2012-02-15

## 2012-02-05 NOTE — ED Provider Notes (Signed)
History     CSN: [id number]  Arrival date & time 02/05/12  1515   None     Chief Complaint  Patient presents with  . Fall    (Consider location/radiation/quality/duration/timing/severity/associated sxs/prior treatment) Patient is a 61 y.o. male presenting with fall. The history is provided by the patient. No language interpreter was used.  Fall The accident occurred 1 to 2 hours ago. The fall occurred while walking. He fell from a height of 3 to 5 ft. He landed on concrete. There was no blood loss. Point of impact: right chest. The pain is at a severity of 5/10. The pain is moderate. He was not ambulatory at the scene.  Pt reports he tripped and fell in a parking lot.   Pt complains of pain to his right chest.  Pt is concerned that he could have broken a rib  Past Medical History  Diagnosis Date  . Diabetes mellitus   . Hypertension   . Sleep apnea   . Asthma   . Bipolar affective   . Conversion disorder   . Cornea disorder   . Cataract   . Reflux   . 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 Knee arthroscopy   . Cholecystectomy   . Kidney stone surgery     No family history on file.  History  Substance Use Topics  . Smoking status: Former Games developer  . Smokeless tobacco: Not on file  . Alcohol Use: No      Review of Systems  Cardiovascular: Positive for chest pain.  All other systems reviewed and are negative.    Allergies  Codeine and Morphine and related  Home Medications   Current Outpatient Rx  Name Route Sig Dispense Refill  . FUROSEMIDE 20 MG PO TABS Oral Take 20 mg by mouth daily.      . INSULIN ASPART 100 UNIT/ML [place] SOLN Subcutaneous Inject 15-35 Units into the skin 3 (three) times daily before meals. Use sliding scale     . INSULIN GLARGINE 100 UNIT/ML [place] SOLN Subcutaneous Inject 45-50 Units into the skin 2 (two) times daily. Give 50 units in the morning and 45 units at supper time     .  L-METHYLFOLATE CALCIUM 7.5 MG PO TABS Oral Take 1 tablet by mouth daily.     Marland Kitchen LANSOPRAZOLE 30 MG PO CPDR Oral Take 30 mg by mouth 2 (two) times daily.      Marland Kitchen LATANOPROST 0.005 % OP SOLN Both Eyes Place 1 drop into both eyes at bedtime.      Marland Kitchen METFORMIN HCL 1000 MG PO TABS Oral Take 1,000 mg by mouth 2 (two) times daily with a meal.      . MONTELUKAST SODIUM 10 MG PO TABS Oral Take 10 mg by mouth at bedtime.      . OXYBUTYNIN CHLORIDE ER 10 MG PO TB24 Oral Take 10 mg by mouth daily.      Marland Kitchen PENTOSAN POLYSULFATE SODIUM 100 MG PO CAPS Oral Take 200 mg by mouth 2 (two) times daily.      Marland Kitchen VILAZODONE HCL 40 MG PO TABS Oral Take 1 tablet by mouth daily.        BP 170/91  Temp 98 F (36.7 C) (Oral)  Resp 16  Ht 5\' 11"  (1.803 m)  Wt 275 lb (124.739 kg)  BMI 38.35 kg/m2  SpO2 100%  Physical Exam  Nursing note and vitals reviewed.  Constitutional: He is oriented to person, place, and time. He appears well-developed and well-nourished.  HENT:  Head: Normocephalic and atraumatic.  Right Ear: External ear normal.  Left Ear: External ear normal.  Eyes: Conjunctivae are normal. Pupils are equal, round, and reactive to light.  Neck: Normal range of motion.  Cardiovascular: Normal rate and regular rhythm.   Pulmonary/Chest: Effort normal.  Abdominal: Soft.  Musculoskeletal: Normal range of motion.  Neurological: He is alert and oriented to person, place, and time.  Skin: Skin is warm.  Psychiatric: He has a normal mood and affect.    ED Course  Procedures (including critical care time)  Labs Reviewed - No data to display Dg Ribs Unilateral W/chest Right  02/05/2012  *RADIOLOGY REPORT*  Clinical Data: Chest pain following a fall today.  RIGHT RIBS AND CHEST - 3+ VIEW  Comparison: 09/03/2009.  Findings: Borderline enlarged cardiac silhouette with an interval decrease in size with an improved inspiration.  Diffuse osteopenia. No rib fracture or pneumothorax seen.  Lower thoracic spine  degenerative changes.  IMPRESSION: No rib fracture or acute abnormality.   Original Report Authenticated By: Darrol Angel, M.D.      1. Contusion, chest wall       MDM  No obvious fx,   Pt given rx for pain medication.   I had respiratory instruct in incentive spirometry so that if pt has occult fx he will get in deep breath.          Lonia Skinner [place], [place] 02/05/12 1904

## 2012-02-05 NOTE — ED Notes (Signed)
Step into hole while walking and fell forward.  States he landed on his chest and thinks he has broken a rib.  Reports some SOB.

## 2012-02-05 NOTE — ED Provider Notes (Signed)
Medical screening examination/treatment/procedure(s) were performed by non-physician practitioner and as supervising physician I was immediately available for consultation/collaboration.   Rolan Bucco, MD 02/05/12 [phone]

## 2012-10-28 ENCOUNTER — Other Ambulatory Visit: Payer: Self-pay | Admitting: Geriatric Medicine

## 2012-10-28 MED ORDER — OXYCODONE-ACETAMINOPHEN 5-325 MG PO TABS: ORAL_TABLET | ORAL | Status: DC

## 2012-10-29 ENCOUNTER — Non-Acute Institutional Stay (SKILLED_NURSING_FACILITY): Payer: Managed Care, Other (non HMO) | Admitting: Internal Medicine

## 2012-10-29 DIAGNOSIS — E1149 Type 2 diabetes mellitus with other diabetic neurological complication: Secondary | ICD-10-CM

## 2012-10-29 DIAGNOSIS — M009 Pyogenic arthritis, unspecified: Secondary | ICD-10-CM

## 2012-10-29 DIAGNOSIS — A4901 Methicillin susceptible Staphylococcus aureus infection, unspecified site: Secondary | ICD-10-CM

## 2012-10-29 DIAGNOSIS — I2510 Atherosclerotic heart disease of native coronary artery without angina pectoris: Secondary | ICD-10-CM

## 2012-10-29 NOTE — Progress Notes (Signed)
Patient ID: Mike Hunter, male   DOB: 1951/04/16, 62 y.o.   MRN: [id number] Chief complaint; admission to SNF close stay at [facility] May 8 through May 15 07/20/2012  History; this is a 62 year old man who underwent a left total knee arthroplasty on 10/04/2012. The patient tells me he did well for a week and was at home when he fell. He was then seen by his orthopedic surgeon in the office and he had noted some increasing swelling and erythema around the incision and subsequently underwent a knee aspiration, which did demonstrate the possibility of infection. He was subsequently admitted to the hospital. He apparently ultimately cultured methicillin sensitive staph. Aureus out of both his blood and the aspirate that. The patient states he was quite sick in the hospital, although this is not well described in the discharge summary. Nevertheless he is on nafcillin and rifampin for a further 6 weeks. He also was taken to the OR, apparently be artificial joint was surgically cleaned. However, the patient states it is still in place, once again, I don't have a clear sense of the. He was followed by infectious disease throughout the hospitalization.  Past medical history; obstructive sleep apnea, hyperlipidemia, TIA, type 2 diabetes with diabetic neuropathy, on insulin, nephrolithiasis, BPH, history of prostatitis, history of gastroesophageal reflux, peptic ulcer disease, hyperlipidemia, bipolar disorder, glaucoma, ST elevation MI at [facility] in 2012.   Past surgical history; cystoscopy for kidney stones, cholecystectomy, prostate surgery,  Medications; nafcillin 2 g every 4 hours for further 6 weeks, rifampin, 600 mg once a day, ferrous sulfate 325 a day, Lasix 20 a day, Lantus insulin 65 units in the morning and 60 units in the evening, latanaprost, across one drop both eyes at bedtime 0.005%, Lipitor 40 daily, metformin, 1000 twice a day, NovoLog sliding scale, OxyContin, 10 mg  every evening, Prevacid, 30 mg twice a day, Zoloft 50 every morning, Singulair, 10 mg every evening, Topamax 50 mg at bedtime  Socially; patient was independent prior to his knee surgery. Did not use an ambulatory assist device. Lives with his wife in [place].  Family history; none related by the patient.  Review of system. Respiratory no shortness of breath. Cardiac no chest no palpitations. GI no vomiting, no abdominal pain, states had one soft bowel movement yesterday, but no diarrhea. GU; long history of urinary difficulties related to prostate, including urinary incontinence, urinary frequency.  Physical exam; pulse 72, respirations 18. Gen. patient is in no distress. Respiratory; clear entry bilaterally. No wheezing. Cardiac; S1, S2 normal no gallops. No murmurs. There is a slight left parasternal lift. No elevation of jugular venous pressure. Abdomen obese. No liver no spleen no tenderness. No masses. GU bladder is nondistended, urinary incontinence noted. No CVA tenderness. Extremities his right knee incision has a slight degree of erythema around it, an expected degree of swelling, but otherwise does not look ominous. Vascular peripheral pulses are palpable. Dorsalis pedis and posterior tibial. Neurologic absent. Reflexes decreased sensation in his feet  Impression/plan;  #1. 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 He is postop on the knee. I don't have a clear idea what was done operatively however, he is 50% weight-bearing with a walker. On nafcillin and rifampin as listed above for further 6 weeks. Follows with infectious disease. #2 apparent MSSA sepsis, although this is not specifically stated. His cardiac status is stable. White blood count was 13.1 on May 15. BUN and creatinine were  normal. On May 14 #3 diabetic neuropathy on large doses of Lantus twice a day. We'll follow while he is here. #4 apparent long-standing urinary  incontinence and frequency, which the patient states is related to previous prostate surgery. Prostatitis. He is on oxybutynin.  #5 history of coronary artery disease was with an ST elevation MI in 2012 per [place] records, no evidence this is active.  I have very little information on this man's actual culture is also does have appointments with infectious disease. I will followup on his last work including a CBC and metabolic panel early next week. He is already ambulate. His insurance is [employer], I am assuming that it is possible they may be looking for an early discharge with home training on his IV antibiotics. I will talk to the staff about this.

## 2012-11-04 ENCOUNTER — Non-Acute Institutional Stay (SKILLED_NURSING_FACILITY): Payer: Managed Care, Other (non HMO) | Admitting: Internal Medicine

## 2012-11-04 DIAGNOSIS — D473 Essential (hemorrhagic) thrombocythemia: Secondary | ICD-10-CM

## 2012-11-04 DIAGNOSIS — D62 Acute posthemorrhagic anemia: Secondary | ICD-10-CM

## 2012-11-04 DIAGNOSIS — D7289 Other specified disorders of white blood cells: Secondary | ICD-10-CM

## 2012-11-08 ENCOUNTER — Non-Acute Institutional Stay (SKILLED_NURSING_FACILITY): Payer: Managed Care, Other (non HMO) | Admitting: Internal Medicine

## 2012-11-08 DIAGNOSIS — M009 Pyogenic arthritis, unspecified: Secondary | ICD-10-CM

## 2012-11-08 DIAGNOSIS — A4901 Methicillin susceptible Staphylococcus aureus infection, unspecified site: Secondary | ICD-10-CM

## 2012-11-08 NOTE — Progress Notes (Signed)
Patient ID: Mike Hunter, male   DOB: May 27, 1951, 62 y.o.   MRN: [id number] Chief complaint; review of left total knee infection.  History; this is a 62 year old man who underwent a left total knee replacement on April 21 of. He did well for a week when he developed increasing pain and swelling. He ultimately cultured methicillin sensitive staph out of the knee joint as well as his bladder. He comes here on nafcillin and rifampin. He is tolerating the antibiotics well. He has not been febrile. He tells me that his knee was painful. However, he feels he overworked it yesterday. The knee is still swollen. His lab work from May 19 showed a white count of 13.5, which is unchanged from May 15 at which time it was 13.1. His hemoglobin is 8.9, vs. 9.5, when he left the hospital. Differential count shows 77% neutrophils. He is due to see orthopedics on Thursday. He is somewhat concerned that his insurance will cut him off after this visit in which case arrangements for home antibioti Will need to be made.   Review of systems Gen. no fever no chills. Respiratory no cough no sputum. Cardiac no chest pain. GI tells me he was having some diarrhea however, he had a formed bowel movement yesterday and none today. Musculoskeletal as noted. Left knee pain and swelling.  On examination  Respiratory clear entry bilaterally. Cardiac heart sounds are normal. No murmurs. Musculoskeletal left knee. There is some swelling and probably an effusion, although it would be small. More on the lateral aspect of the left knee is noted. No clinical evidence of a DVT. His distal leg appears completely normal. No edema.  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 I told the patient I think this is improving; another CBC with differential was ordered, although I don't see this result. He is due to see orthopedics on Thursday at which time I suspect they will remove the sutures. I have not changed his  antibiotics. He is due to followup with Dr. Trisha Mangle, of infectious disease. He is ambulating with a walker, and doing fairly well is my understanding

## 2012-11-10 ENCOUNTER — Non-Acute Institutional Stay (SKILLED_NURSING_FACILITY): Payer: Managed Care, Other (non HMO) | Admitting: Adult Health

## 2012-11-10 DIAGNOSIS — E1159 Type 2 diabetes mellitus with other circulatory complications: Secondary | ICD-10-CM

## 2012-11-10 DIAGNOSIS — M009 Pyogenic arthritis, unspecified: Secondary | ICD-10-CM

## 2012-11-11 ENCOUNTER — Other Ambulatory Visit: Payer: Self-pay | Admitting: Geriatric Medicine

## 2012-11-11 MED ORDER — OXYCODONE HCL ER 15 MG PO T12A: 15.0000 mg | EXTENDED_RELEASE_TABLET | Freq: Two times a day (BID) | ORAL | Status: DC

## 2012-11-30 NOTE — Progress Notes (Signed)
Patient ID: Mike Hunter, male   DOB: January 02, 1951, 62 y.o.   MRN: [id number]        PROGRESS NOTE  DATE: 11/04/2012  FACILITY:  [facility]  LEVEL OF CARE: SNF (31)  Acute Visit  CHIEF COMPLAINT:  Manage leukocytosis, acute blood loss anemia, and thrombocytosis.    HISTORY OF PRESENT ILLNESS: I was requested by the staff to assess the patient regarding above problem(s):  LEUKOCYTOSIS:  On 11/03/2012, patient's white count was 13.5, ANC 10.3.  On 10/28/2012, white count was 13.1.  Patient is having a left knee prosthetic infection.  He is status post incision and drainage and he is  currently on IV antibiotics.  He denies increasing knee pain.    ANEMIA: The anemia is secondary to acute blood loss.  On 11/03/2012, hemoglobin was 8.9, MCV 80.5.  On 10/28/2012, hemoglobin was 9.5.  The anemia is unstable.  The patient denies fatigue, melena or hematochezia. No complications from the medications currently being used.    THROMBOCYTOSIS:  On 11/03/2012, patient's platelet count was 553.  On 10/28/2012, platelet count was 495.  He denies headaches or paresthesias.    PAST MEDICAL HISTORY : Reviewed.  No changes.  CURRENT MEDICATIONS: Reviewed per [facility]  REVIEW OF SYSTEMS:  GENERAL: no change in appetite, no fatigue, no weight changes, no fever, chills or weakness RESPIRATORY: no cough, SOB, DOE,, wheezing, hemoptysis CARDIAC: no chest pain, edema or palpitations GI: no abdominal pain, diarrhea, constipation, heart burn, nausea or vomiting  PHYSICAL EXAMINATION           GENERAL: no acute distress, moderately obese body habitus EYES: conjunctivae normal, sclerae normal, normal eye lids NECK: supple, trachea midline, no neck masses, no thyroid tenderness, no thyromegaly LYMPHATICS: no LAN in the neck, no supraclavicular LAN RESPIRATORY: breathing is even & unlabored, BS CTAB CARDIAC: RRR, no murmur,no extra heart sounds EDEMA/VARICOSITIES:  +2 bilateral lower  extremity edema GI: abdomen soft, normal BS, no masses, no tenderness, no hepatomegaly, no splenomegaly PSYCHIATRIC: the patient is alert & oriented to person, affect & behavior appropriate  ASSESSMENT/PLAN:  Leukocytosis with left shift.  Unstable.  Likely due to left knee infection.  Reassess next week.    Acute blood loss anemia.  Unstable problem.   Currently on iron.  Reassess next week.    Thrombocytosis.  Unstable problem.  Platelet count is trending up.  Likely acute phase reactant.   We will follow up next week.    CPT CODE: 91478.

## 2012-12-30 DIAGNOSIS — E119 Type 2 diabetes mellitus without complications: Secondary | ICD-10-CM | POA: Insufficient documentation

## 2012-12-30 DIAGNOSIS — M009 Pyogenic arthritis, unspecified: Secondary | ICD-10-CM | POA: Insufficient documentation

## 2012-12-30 NOTE — Progress Notes (Signed)
Patient ID: Mike Hunter, male   DOB: 06/03/1951, 62 y.o.   MRN: [id number]  [facility]  , Allergies  Allergen Reactions  . Codeine Nausea And Vomiting  . Morphine And Related Nausea And Vomiting     Chief Complaint  Patient presents with  . Discharge Note    HPI:  He is being discharged to home he was hospitalized for a septic knee was placed on iv abt through 12-09-12. He will complete the abt through home health. He will need home health for pt/ot/nursing/iv abt. He will not need dme; will need prescriptions.   Past Medical History  Diagnosis Date  . Diabetes mellitus   . Hypertension   . Sleep apnea   . Asthma   . Bipolar affective   . Conversion disorder   . Cornea disorder   . Cataract   . Reflux   . 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 Knee arthroscopy    . Cholecystectomy    . Kidney stone surgery      VITAL SIGNS BP 119/79  Pulse 108  Ht 5\' 11"  (1.803 m)  Wt 260 lb 12.8 oz (118.298 kg)  BMI 36.39 kg/m2   Patient's Medications  New Prescriptions   No medications on file  Previous Medications   ALBUTEROL (PROVENTIL HFA;VENTOLIN HFA) 108 (90 BASE) MCG/ACT INHALER    Inhale 2 puffs into the lungs every 4 (four) hours as needed for wheezing.   ATORVASTATIN (LIPITOR) 40 MG TABLET    Take 40 mg by mouth daily.   CANAGLIFLOZIN (INVOKANA) 100 MG TABS    Take 100 mg by mouth daily.   FERROUS SULFATE 325 (65 FE) MG TABLET    Take 325 mg by mouth daily with breakfast.   FUROSEMIDE (LASIX) 20 MG TABLET    Take 20 mg by mouth daily.    INSULIN ASPART (NOVOLOG FLEXPEN) 100 UNIT/ML INJECTION    Inject 5 Units into the skin 3 (three) times daily before meals. 5 units prior to meals for cbg>= 150   INSULIN GLARGINE (LANTUS SOLOSTAR) 100 UNIT/ML INJECTION    Inject 65 Units into the skin 2 (two) times daily. Give 65 units in the and 60 units in th pm   L-METHYLFOLATE CALCIUM 7.5 MG TABS    Take 1 tablet by  mouth daily.    LANSOPRAZOLE (PREVACID) 30 MG CAPSULE    Take 30 mg by mouth 2 (two) times daily.     LATANOPROST (XALATAN) 0.005 % OPHTHALMIC SOLUTION    Place 1 drop into both eyes at bedtime.     METFORMIN (GLUCOPHAGE) 1000 MG TABLET    Take 1,000 mg by mouth 2 (two) times daily with a meal.     MONTELUKAST (SINGULAIR) 10 MG TABLET    Take 10 mg by mouth at bedtime.   OXYCODONE (OXYCONTIN) 15 MG T12A    Take 1 tablet (15 mg total) by mouth every 12 (twelve) hours.   SERTRALINE (ZOLOFT) 50 MG TABLET    Take 50 mg by mouth daily.   TOPIRAMATE (TOPAMAX) 50 MG TABLET    Take 50 mg by mouth at bedtime.  Modified Medications   Modified Medication Previous Medication   OXYCODONE-ACETAMINOPHEN (PERCOCET/ROXICET) 5-325 MG PER TABLET oxyCODONE-acetaminophen (PERCOCET/ROXICET) 5-325 MG per tablet      Take 1-2 tablets by mouth every 6 (six) hours as needed. Take one to two tablets every four hours as  needed for pain.    Take one to two tablets every four hours as needed for pain.  Discontinued Medications   OXYBUTYNIN (DITROPAN-XL) 10 MG 24 HR TABLET    Take 10 mg by mouth daily.     PENTOSAN POLYSULFATE (ELMIRON) 100 MG CAPSULE    Take 200 mg by mouth 2 (two) times daily.     VILAZODONE HCL (VIIBRYD) 40 MG TABS    Take 1 tablet by mouth daily.      SIGNIFICANT DIAGNOSTIC EXAMS  11-03-12: wbc 13.5; hgb 8.9; hct 28.5; mcv 80.5; plt 553; glucose 88; bun 7; creat 0.82; k+ 3.6 Na++137    Review of Systems  Constitutional: Negative for malaise/fatigue.  Respiratory: Negative for shortness of breath.   Cardiovascular: Negative for chest pain.  Gastrointestinal: Negative for heartburn, abdominal pain and constipation.  Musculoskeletal: Negative for myalgias and joint pain.  Skin: Negative.   Neurological: Negative for headaches.  Psychiatric/Behavioral: Negative for depression. The patient does not have insomnia.    Physical Exam  Constitutional: He is oriented to person, place, and time. He  appears well-developed and well-nourished.  obese  Neck: Neck supple. No thyromegaly present.  Cardiovascular: Normal rate, regular rhythm and intact distal pulses.   Respiratory: Effort normal and breath sounds normal. No respiratory distress.  GI: Soft. Bowel sounds are normal. He exhibits no distension. There is no tenderness.  Musculoskeletal:  Uses wheelchair; walker  Neurological: He is alert and oriented to person, place, and time.  Skin: Skin is warm and dry.       ASSESSMENT/ PLAN:  Will discharge to home with home health for pto/ot/nursing/iv abt. Will need to follow up with I/D with Dr. Silvestre Mesi. Will need a total of 6 weeks of iv abt. Will not need dme; prescriptions written.  Time spent with patient 45 minutes.

## 2013-01-07 ENCOUNTER — Non-Acute Institutional Stay (SKILLED_NURSING_FACILITY): Payer: Managed Care, Other (non HMO) | Admitting: Internal Medicine

## 2013-01-07 DIAGNOSIS — E1159 Type 2 diabetes mellitus with other circulatory complications: Secondary | ICD-10-CM

## 2013-01-07 DIAGNOSIS — A4901 Methicillin susceptible Staphylococcus aureus infection, unspecified site: Secondary | ICD-10-CM

## 2013-01-07 DIAGNOSIS — M009 Pyogenic arthritis, unspecified: Secondary | ICD-10-CM

## 2013-01-07 NOTE — Progress Notes (Signed)
Patient ID: TIMUR NIBERT, male   DOB: 12/07/1950, 62 y.o.   MRN: [id number]         Patient ID: ZAEDEN LASTINGER, male   DOB: 1951/02/07, 62 y.o.   MRN: [id number] Chief complaint; admission to SNF close stay at [facility] 12/31/2012 through 01/05/2013. [facility]  History; this is a 62 year old man who underwent a left total knee arthroplasty on 10/04/2012. The patient tells me he did well for a week and was at home when he fell. He was then seen by his orthopedic surgeon in the office and he had noted some increasing swelling and erythema around the incision and subsequently underwent a knee aspiration, which did demonstrate the possibility of infection. He was subsequently admitted to the hospital. He apparently ultimately cultured methicillin sensitive staph Aureus out of both his blood and the aspirate. The patient states he was quite sick in the hospital, although this is not well described in the discharge summary. Nevertheless he was on nafcillin and rifampin for a 6 weeks. When he was in the facility last time he went home to complete his antibiotics.         Per discussion with Mr. Bennett Scrape, he continued to have pain, swelling, and erythema around the knee, he was brought back into the hospital to have the original prosthesis removed. This appears to been reasonably uneventful. He comes out of the hospital on a combination of nafcillin and Ancef, which seems unusual as far, as I'm concerned. I will recheck with Dr. Trisha Mangle, of infectious disease. Patient is concerned about continuing severe pain in the left knee, elevation of his blood sugars.  Past medical history; obstructive sleep apnea, hyperlipidemia, TIA, type 2 diabetes with diabetic neuropathy, on insulin, nephrolithiasis, BPH, history of prostatitis, history of gastroesophageal reflux, peptic ulcer disease, hyperlipidemia, bipolar disorder, glaucoma, ST elevation MI at [facility] in 2012. Irritable bowel.  Past  surgical history; cystoscopy for kidney stones, cholecystectomy, prostate surgery,  Medications;   Socially; patient was independent prior to his knee surgery. Did not use an ambulatory assist device. Lives with his wife in [place]. His care was being done by his daughter, who is a CNA. Patient has significant concerns about his family and work. He is irritable bowel and makes frequent trips to the bathroom, which he finds difficult when he is on his own.   Family history; none related by the patient.  Review of system. Respiratory no shortness of breath. Cardiac no chest no palpitations. GI no vomiting, no abdominal pain, states had one soft bowel movement yesterday, but no diarrhea. Has a history of question diarrhea, predominat irratable bowel. GU; long history of urinary difficulties related to prostate, including urinary incontinence, urinary frequency.  Physical exam; pulse 72, respirations 18. Gen. patient is in no distress. Respiratory; clear entry bilaterally. No wheezing. Cardiac; S1, S2 normal no gallops. No murmurs. There is a slight left parasternal lift. No elevation of jugular venous pressure. Abdomen obese. No liver no spleen no tenderness. No masses. GU bladder is nondistended, urinary incontinence noted. No CVA tenderness. Extremities his right knee incision has a slight degree of erythema around it, an expected degree of swelling, but otherwise does not look ominous. Vascular peripheral pulses are palpable. Dorsalis pedis and posterior tibial. Neurologic absent. Reflexes decreased sensation in his feet  Impression/plan;   #1. 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 I para 5 with his infectious disease doctor, that she has changed his antibiotic  to Ancef only. His prosthesis is now been removed #2 history of MSSA sepsis #3 diabetic neuropathy on large doses of Lantus twice a day. We'll follow while he is here. He is also on an excessive dose of metformin, which I  will correct #4 apparent long-standing urinary incontinence and frequency, which the ptient states is related to previous prostate surgery. Prostatitis. He is on oxybutynin.   #5 history of coronary artery disease was with an ST elevation MI in 2012 per [place] records, no evidence this is active.  The patient's antibiotics will be corrected to Ancef alone. Metformin will be reduced to his usual dose of the thousand twice a day. He will require augmentation of his narcotics as he is somewhat tolerant currently on Norco 10/325. She has urinary frequency, as well as fecal frequency, I think this complicates his ability to be care for at home, which concerns him a great deal. Nevertheless, this is an insurance issue. No doubt that he will have to spend some of the duration of time on antibiotics at home and he seems aware of this.. he is on Lovenox transitioned to Coumadin. He is at high DVT risk due to the recent orthopedic surgery and knee immobilizer. Nevertheless, I think the risk and be managed with the use of xarelto, and I made this change

## 2013-01-18 ENCOUNTER — Non-Acute Institutional Stay (SKILLED_NURSING_FACILITY): Payer: Managed Care, Other (non HMO) | Admitting: Internal Medicine

## 2013-01-18 DIAGNOSIS — K219 Gastro-esophageal reflux disease without esophagitis: Secondary | ICD-10-CM

## 2013-01-18 DIAGNOSIS — R32 Unspecified urinary incontinence: Secondary | ICD-10-CM

## 2013-01-18 DIAGNOSIS — E1149 Type 2 diabetes mellitus with other diabetic neurological complication: Secondary | ICD-10-CM

## 2013-01-18 DIAGNOSIS — M009 Pyogenic arthritis, unspecified: Secondary | ICD-10-CM

## 2013-02-08 NOTE — Progress Notes (Signed)
Patient ID: Mike Hunter, male   DOB: 21-Feb-1951, 62 y.o.   MRN: [id number]        PROGRESS NOTE  DATE: 01/18/2013   FACILITY: [facility]  LEVEL OF CARE: SNF (31)  Discharge Visit  CHIEF COMPLAINT:  Manage left knee periprosthetic infection.    HISTORY OF PRESENT ILLNESS: I was requested by the social worker to perform face-to-face evaluation for discharge:  Patient was admitted to this facility for short-term rehabilitation after the patient's recent hospitalization.  Patient has completed SNF rehabilitation and therapy has cleared the patient for discharge.    Reassessment of ongoing problem(s):  Patient underwent removal of left knee prosthesis and discharged to this facility with IV antibiotics for six weeks.  He is scheduled to be discharged on 01/21/2013.  He denies left knee pain currently.  PAST MEDICAL HISTORY : Reviewed.  No changes.  CURRENT MEDICATIONS: Reviewed per [facility]  REVIEW OF SYSTEMS:  GENERAL: no change in appetite, no fatigue, no weight changes, no fever, chills or weakness RESPIRATORY: no cough, SOB, DOE, wheezing, hemoptysis CARDIAC: no chest pain, edema or palpitations GI: no abdominal pain, diarrhea, constipation, heart burn, nausea or vomiting  PHYSICAL EXAMINATION  VS:  T 98      P 92      RR 18     BP 126/72     POX %       WT (Lb) 249  GENERAL: no acute distress, moderately obese body habitus EYES: conjunctivae normal, sclerae normal, normal eye lids NECK: supple, trachea midline, no neck masses, no thyroid tenderness, no thyromegaly LYMPHATICS: no LAN in the neck, no supraclavicular LAN RESPIRATORY: breathing is even & unlabored, BS CTAB CARDIAC: RRR, no murmur,no extra heart sounds EDEMA/VARICOSITIES:  +1 bilateral lower extremity edema  ARTERIAL: left lower extremity is in an immobilizer   GI: abdomen soft, normal BS, no masses, no tenderness, no hepatomegaly, no splenomegaly PSYCHIATRIC: the patient is alert &  oriented to person, affect & behavior appropriate  LABS/RADIOLOGY: 01/13/2013:  Hemoglobin 10.1, MCV 80.7, platelets 437, WBC 7.    Glucose 127, otherwise BMP normal.    12/31/2012:  Hemoglobin 9.8, platelets 311.     ASSESSMENT/PLAN:  Left knee periprosthetic infection.  Continue IV antibiotics for three more weeks.  We will discharge with IV antibiotics.    Diabetes with neuropathy.  Continue current medications.    Urinary incontinence.  Continue oxybutynin.     GERD.  Well controlled.     Depression.  Sertraline was increased.    I have filled out patient's discharge paperwork and written prescriptions.  Patient will receive home health PT, OT, and nursing for IV antibiotics.   DME provided:  None.    Total discharge time: Greater than 30 minutes Discharge time involved coordination of the discharge process with social worker, nursing staff and therapy department. Medical justification for home health services/DME verified.  CPT CODE: 40981

## 2013-02-09 DIAGNOSIS — R32 Unspecified urinary incontinence: Secondary | ICD-10-CM | POA: Insufficient documentation

## 2013-02-09 DIAGNOSIS — E1149 Type 2 diabetes mellitus with other diabetic neurological complication: Secondary | ICD-10-CM | POA: Insufficient documentation

## 2013-02-09 DIAGNOSIS — K219 Gastro-esophageal reflux disease without esophagitis: Secondary | ICD-10-CM | POA: Insufficient documentation

## 2014-01-17 DIAGNOSIS — N400 Enlarged prostate without lower urinary tract symptoms: Secondary | ICD-10-CM | POA: Diagnosis present

## 2014-11-01 ENCOUNTER — Encounter (HOSPITAL_BASED_OUTPATIENT_CLINIC_OR_DEPARTMENT_OTHER): Payer: Self-pay | Admitting: *Deleted

## 2014-11-01 ENCOUNTER — Emergency Department (HOSPITAL_BASED_OUTPATIENT_CLINIC_OR_DEPARTMENT_OTHER): Payer: Medicare Other

## 2014-11-01 ENCOUNTER — Emergency Department (HOSPITAL_BASED_OUTPATIENT_CLINIC_OR_DEPARTMENT_OTHER)
Admission: EM | Admit: 2014-11-01 | Discharge: 2014-11-01 | Disposition: A | Discharge status: Home / Self Care | Payer: Medicare Other | Attending: Emergency Medicine | Admitting: Emergency Medicine

## 2014-11-01 DIAGNOSIS — F319 Bipolar disorder, unspecified: Secondary | ICD-10-CM | POA: Diagnosis not present

## 2014-11-01 DIAGNOSIS — Z87891 Personal history of nicotine dependence: Secondary | ICD-10-CM | POA: Insufficient documentation

## 2014-11-01 DIAGNOSIS — Z794 Long term (current) use of insulin: Secondary | ICD-10-CM | POA: Diagnosis not present

## 2014-11-01 DIAGNOSIS — E119 Type 2 diabetes mellitus without complications: Secondary | ICD-10-CM | POA: Insufficient documentation

## 2014-11-01 DIAGNOSIS — J45901 Unspecified asthma with (acute) exacerbation: Secondary | ICD-10-CM | POA: Diagnosis not present

## 2014-11-01 DIAGNOSIS — I10 Essential (primary) hypertension: Secondary | ICD-10-CM | POA: Insufficient documentation

## 2014-11-01 DIAGNOSIS — Z79899 Other long term (current) drug therapy: Secondary | ICD-10-CM | POA: Diagnosis not present

## 2014-11-01 DIAGNOSIS — Z8669 Personal history of other diseases of the nervous system and sense organs: Secondary | ICD-10-CM | POA: Insufficient documentation

## 2014-11-01 DIAGNOSIS — R0602 Shortness of breath: Secondary | ICD-10-CM | POA: Diagnosis present

## 2014-11-01 DIAGNOSIS — K219 Gastro-esophageal reflux disease without esophagitis: Secondary | ICD-10-CM | POA: Diagnosis not present

## 2014-11-01 LAB — CBC WITH DIFFERENTIAL/PLATELET
BASOS ABS: 0.1 10*3/uL (ref 0.0–0.1)
Basophils Relative: 0 % (ref 0–1)
Eosinophils Absolute: 0.2 10*3/uL (ref 0.0–0.7)
Eosinophils Relative: 2 % (ref 0–5)
HCT: 41.1 % (ref 39.0–52.0)
Hemoglobin: 13.6 g/dL (ref 13.0–17.0)
LYMPHS ABS: 2.2 10*3/uL (ref 0.7–4.0)
LYMPHS PCT: 19 % (ref 12–46)
MCH: 27.5 pg (ref 26.0–34.0)
MCHC: 33.1 g/dL (ref 30.0–36.0)
MCV: 83 fL (ref 78.0–100.0)
Monocytes Absolute: 1 10*3/uL (ref 0.1–1.0)
Monocytes Relative: 8 % (ref 3–12)
NEUTROS ABS: 8.4 10*3/uL — AB (ref 1.7–7.7)
Neutrophils Relative %: 71 % (ref 43–77)
PLATELETS: 314 10*3/uL (ref 150–400)
RBC: 4.95 MIL/uL (ref 4.22–5.81)
RDW: 14.8 % (ref 11.5–15.5)
WBC: 11.9 10*3/uL — AB (ref 4.0–10.5)

## 2014-11-01 LAB — URINALYSIS, ROUTINE W REFLEX MICROSCOPIC
Bilirubin Urine: NEGATIVE
Glucose, UA: NEGATIVE mg/dL
Hgb urine dipstick: NEGATIVE
Ketones, ur: NEGATIVE mg/dL
LEUKOCYTES UA: NEGATIVE
Nitrite: NEGATIVE
PROTEIN: 30 mg/dL — AB
Specific Gravity, Urine: 1.013 (ref 1.005–1.030)
UROBILINOGEN UA: 0.2 mg/dL (ref 0.0–1.0)
pH: 6.5 (ref 5.0–8.0)

## 2014-11-01 LAB — URINE MICROSCOPIC-ADD ON

## 2014-11-01 LAB — COMPREHENSIVE METABOLIC PANEL
ALT: 55 U/L (ref 17–63)
ANION GAP: 9 (ref 5–15)
AST: 35 U/L (ref 15–41)
Albumin: 4.1 g/dL (ref 3.5–5.0)
Alkaline Phosphatase: 92 U/L (ref 38–126)
BUN: 17 mg/dL (ref 6–20)
CALCIUM: 8.8 mg/dL — AB (ref 8.9–10.3)
CHLORIDE: 107 mmol/L (ref 101–111)
CO2: 21 mmol/L — ABNORMAL LOW (ref 22–32)
CREATININE: 1.15 mg/dL (ref 0.61–1.24)
Glucose, Bld: 212 mg/dL — ABNORMAL HIGH (ref 65–99)
POTASSIUM: 4.1 mmol/L (ref 3.5–5.1)
Sodium: 137 mmol/L (ref 135–145)
TOTAL PROTEIN: 7.7 g/dL (ref 6.5–8.1)
Total Bilirubin: 0.6 mg/dL (ref 0.3–1.2)

## 2014-11-01 LAB — TROPONIN I
Troponin I: <0.03 ng/mL (ref <0.031)
Troponin I: <0.03 ng/mL (ref <0.031)

## 2014-11-01 LAB — BRAIN NATRIURETIC PEPTIDE: B Natriuretic Peptide: 36.7 pg/mL (ref 0.0–100.0)

## 2014-11-01 MED ORDER — METHYLPREDNISOLONE SODIUM SUCC 125 MG IJ SOLR
125.0000 mg | Freq: Once | INTRAMUSCULAR | Status: AC
  Administered 2014-11-01: 125 mg via INTRAVENOUS
  Filled 2014-11-01: qty 2

## 2014-11-01 MED ORDER — PREDNISONE 20 MG PO TABS: ORAL_TABLET | ORAL | Status: DC

## 2014-11-01 MED ORDER — IPRATROPIUM-ALBUTEROL 0.5-2.5 (3) MG/3ML IN SOLN
3.0000 mL | RESPIRATORY_TRACT | Status: DC
Start: 2014-11-01 — End: 2014-11-01
  Administered 2014-11-01: 3 mL via RESPIRATORY_TRACT
  Filled 2014-11-01: qty 3

## 2014-11-01 NOTE — ED Notes (Signed)
Patient transported to X-ray 

## 2014-11-01 NOTE — Discharge Instructions (Signed)
Take prednisone as prescribed.   Use your albuterol pump every 4 hrs as needed.   Use your advair as prescribed.  Follow up with your pulmonologist tomorrow.   Return to ER if you have worse shortness of breath, trouble breathing, chest pain.

## 2014-11-01 NOTE — ED Provider Notes (Signed)
CSN: [id number]     Arrival date & time 11/01/14  1441 History   First MD Initiated Contact with Patient 11/01/14 1507     Chief Complaint  Patient presents with  . Shortness of Breath     (Consider location/radiation/quality/duration/timing/severity/associated sxs/prior Treatment) The history is provided by the patient.  Mike Hunter is a 64 y.o. male hx of DM, HTN, sleep apnea, asthma, bipolar, congestive heart failure presenting with shortness of breath. Patient states that he has been short of breath off and on for years. Over the last month he went to see his pulmonologist and was placed on a course of steroids 2 weeks ago. States that it has really not helped his cough or shortness of breath. Yesterday he woke up the middle of night short of breath despite being on CPAP. He also noticed that his legs are more swollen recently. Today he was taking a walk and has shortness of breath with exertion, which is unusual for him. He did miss his advair this morning.    Past Medical History  Diagnosis Date  . Diabetes mellitus   . Hypertension   . Sleep apnea   . Asthma   . Bipolar affective   . Conversion disorder   . Cornea disorder   . Cataract   . Reflux   . 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 Knee arthroscopy    . Cholecystectomy    . Kidney stone surgery     History reviewed. No pertinent family history. History  Substance Use Topics  . Smoking status: Former Research scientist (life sciences)  . Smokeless tobacco: Not on file  . Alcohol Use: No    Review of Systems  Respiratory: Positive for shortness of breath.   All other systems reviewed and are negative.     Allergies  Codeine and Morphine and related  Home Medications   Prior to Admission medications   Medication Sig Start Date End Date Taking? Authorizing Provider  albuterol (PROVENTIL HFA;VENTOLIN HFA) 108 (90 BASE) MCG/ACT inhaler Inhale 2 puffs into the lungs every 4  (four) hours as needed for wheezing.    Historical Provider, MD  atorvastatin (LIPITOR) 40 MG tablet Take 40 mg by mouth daily.    Historical Provider, MD  Canagliflozin (INVOKANA) 100 MG TABS Take 100 mg by mouth daily.    Historical Provider, MD  ferrous sulfate 325 (65 FE) MG tablet Take 325 mg by mouth daily with breakfast.    Historical Provider, MD  furosemide (LASIX) 20 MG tablet Take 20 mg by mouth daily.     Historical Provider, MD  insulin aspart (NOVOLOG FLEXPEN) 100 UNIT/ML injection Inject 5 Units into the skin 3 (three) times daily before meals. 5 units prior to meals for cbg>= 150    Historical Provider, MD  insulin glargine (LANTUS SOLOSTAR) 100 UNIT/ML injection Inject 65 Units into the skin 2 (two) times daily. Give 65 units in the and 60 units in th pm    Historical Provider, MD  L-methylfolate Calcium 7.5 MG TABS Take 1 tablet by mouth daily.     Historical Provider, MD  lansoprazole (PREVACID) 30 MG capsule Take 30 mg by mouth 2 (two) times daily.      Historical Provider, MD  latanoprost (XALATAN) 0.005 % ophthalmic solution Place 1 drop into both eyes at bedtime.      Historical Provider, MD  metFORMIN (GLUCOPHAGE) 1000 MG tablet Take 1,000 mg  by mouth 2 (two) times daily with a meal.      Historical Provider, MD  montelukast (SINGULAIR) 10 MG tablet Take 10 mg by mouth at bedtime.    Historical Provider, MD  OxyCODONE (OXYCONTIN) 15 mg T12A Take 1 tablet (15 mg total) by mouth every 12 (twelve) hours. 11/11/12   Tiffany L Reed, DO  oxyCODONE-acetaminophen (PERCOCET/ROXICET) 5-325 MG per tablet Take 1-2 tablets by mouth every 6 (six) hours as needed. Take one to two tablets every four hours as needed for pain. 10/28/12   Tiffany L Reed, DO  sertraline (ZOLOFT) 50 MG tablet Take 50 mg by mouth daily.    Historical Provider, MD  topiramate (TOPAMAX) 50 MG tablet Take 50 mg by mouth at bedtime.    Historical Provider, MD   BP 133/80 mmHg  Pulse 91  Temp(Src) 98.9 F (37.2 C)  (Oral)  Resp 20  Wt 265 lb (120.203 kg)  SpO2 99% Physical Exam  Constitutional: He is oriented to person, place, and time.  Chronically ill, tachypneic   HENT:  Head: Normocephalic.  Mouth/Throat: Oropharynx is clear and moist.  Eyes: Conjunctivae are normal. Pupils are equal, round, and reactive to light.  Neck: Normal range of motion. Neck supple.  Cardiovascular: Normal rate, regular rhythm and normal heart sounds.   Pulmonary/Chest:  Minimal wheezing throughout. Bibasilar crackles. Some JVD  Abdominal: Soft. Bowel sounds are normal. He exhibits no distension. There is no tenderness. There is no rebound.  Musculoskeletal: Normal range of motion.  1+ edema bilateral bases   Neurological: He is alert and oriented to person, place, and time.  Skin: Skin is warm and dry.  Psychiatric: He has a normal mood and affect. His behavior is normal. Judgment and thought content normal.  Nursing note and vitals reviewed.   ED Course  Procedures (including critical care time) Labs Review Labs Reviewed  CBC WITH DIFFERENTIAL/PLATELET - Abnormal; Notable for the following:    WBC 11.9 (*)    Neutro Abs 8.4 (*)    All other components within normal limits  COMPREHENSIVE METABOLIC PANEL - Abnormal; Notable for the following:    CO2 21 (*)    Glucose, Bld 212 (*)    Calcium 8.8 (*)    All other components within normal limits  URINALYSIS, ROUTINE W REFLEX MICROSCOPIC - Abnormal; Notable for the following:    APPearance CLOUDY (*)    Protein, ur 30 (*)    All other components within normal limits  URINE MICROSCOPIC-ADD ON - Abnormal; Notable for the following:    Squamous Epithelial / LPF FEW (*)    All other components within normal limits  TROPONIN I  BRAIN NATRIURETIC PEPTIDE  TROPONIN I  I-STAT CHEM 8, ED    Imaging Review Dg Chest 2 View  11/01/2014   CLINICAL DATA:  Chest pain and shortness of breath for the past day; history of hypertension, diabetes, asthma, previous  history of tobacco use.  EXAM: CHEST  2 VIEW  COMPARISON:  PA and lateral chest x-ray of February 02, 2013 and chest CT scan of January 02, 2014  FINDINGS: The lungs are adequately inflated and clear. The interstitial markings are coarse bilaterally. The cardiac silhouette is mildly enlarged. The pulmonary vascularity is not engorged. The trachea is midline. There is no pleural effusion. No pulmonary parenchymal nodules are evident. The bony thorax is unremarkable.  IMPRESSION: Mild enlargement of the cardiac silhouette without pulmonary edema. There is no active cardiopulmonary disease.   Electronically Signed  By: Malique  Martinique M.D.   On: 11/01/2014 16:03     EKG Interpretation   Date/Time:  Wednesday Nov 01 2014 14:52:45 EDT Ventricular Rate:  105 PR Interval:  136 QRS Duration: 90 QT Interval:  340 QTC Calculation: 449 R Axis:   81 Text Interpretation:  Sinus tachycardia Otherwise normal ECG No  significant change since last tracing Confirmed by YAO  MD, Jesson ([id number])  on 11/01/2014 3:07:40 PM      MDM   Final diagnoses:  None   Mike Hunter is a 64 y.o. male here with SOB on exertion. Likely multi factorial. Consider ACS vs CHF vs obesity hypoventilation syndrome vs asthma. I doubt PE. Will check labs, BNP, CXR and give nebs and reassess.   5 PM CXR unremarkable. BNP nl. I think likely asthma. Given steroids and albuterol. Will get delta trop.  6:49 PM Delta trop neg. Felt better now. Ambulated and doesn't appear short of breath and isn't hypoxic. Likely asthma exacerbation. Will start a course of steroids again. Has pulmonary f/u tomorrow at 9 am.     Wandra Arthurs, MD 11/01/14 1850

## 2014-11-01 NOTE — ED Notes (Signed)
Pt ambulated with some chest discomfort and head pain.   Pts sats remained 97-99% on RA,  HR 115-118.  Pt placed back on monitor.   RN made aware.

## 2014-11-01 NOTE — ED Notes (Signed)
Pt c/o CP with SOB with exertion x 1 day

## 2017-01-02 ENCOUNTER — Emergency Department (HOSPITAL_BASED_OUTPATIENT_CLINIC_OR_DEPARTMENT_OTHER)
Admission: EM | Admit: 2017-01-02 | Discharge: 2017-01-02 | Disposition: A | Discharge status: Home / Self Care | Payer: Medicare HMO | Attending: Emergency Medicine | Admitting: Emergency Medicine

## 2017-01-02 ENCOUNTER — Encounter (HOSPITAL_BASED_OUTPATIENT_CLINIC_OR_DEPARTMENT_OTHER): Payer: Self-pay | Admitting: *Deleted

## 2017-01-02 ENCOUNTER — Emergency Department (HOSPITAL_BASED_OUTPATIENT_CLINIC_OR_DEPARTMENT_OTHER): Payer: Medicare HMO

## 2017-01-02 DIAGNOSIS — M791 Myalgia: Secondary | ICD-10-CM | POA: Insufficient documentation

## 2017-01-02 DIAGNOSIS — I110 Hypertensive heart disease with heart failure: Secondary | ICD-10-CM | POA: Insufficient documentation

## 2017-01-02 DIAGNOSIS — M7918 Myalgia, other site: Secondary | ICD-10-CM

## 2017-01-02 DIAGNOSIS — E119 Type 2 diabetes mellitus without complications: Secondary | ICD-10-CM | POA: Insufficient documentation

## 2017-01-02 DIAGNOSIS — R1084 Generalized abdominal pain: Secondary | ICD-10-CM | POA: Diagnosis present

## 2017-01-02 DIAGNOSIS — I509 Heart failure, unspecified: Secondary | ICD-10-CM | POA: Diagnosis not present

## 2017-01-02 DIAGNOSIS — J45909 Unspecified asthma, uncomplicated: Secondary | ICD-10-CM | POA: Insufficient documentation

## 2017-01-02 HISTORY — DX: Pure hypercholesterolemia, unspecified: E78.00

## 2017-01-02 HISTORY — DX: Heart failure, unspecified: I50.9

## 2017-01-02 HISTORY — DX: Personal history of Methicillin resistant Staphylococcus aureus infection: Z86.14

## 2017-01-02 LAB — COMPREHENSIVE METABOLIC PANEL
ALBUMIN: 3.7 g/dL (ref 3.5–5.0)
ALK PHOS: 75 U/L (ref 38–126)
ALT: 28 U/L (ref 17–63)
AST: 20 U/L (ref 15–41)
Anion gap: 9 (ref 5–15)
BILIRUBIN TOTAL: 0.8 mg/dL (ref 0.3–1.2)
BUN: 27 mg/dL — AB (ref 6–20)
CALCIUM: 8.9 mg/dL (ref 8.9–10.3)
CO2: 23 mmol/L (ref 22–32)
Chloride: 105 mmol/L (ref 101–111)
Creatinine, Ser: 1.17 mg/dL (ref 0.61–1.24)
GFR calc Af Amer: >60 mL/min (ref >60)
GFR calc non Af Amer: >60 mL/min (ref >60)
GLUCOSE: 170 mg/dL — AB (ref 65–99)
Potassium: 3.9 mmol/L (ref 3.5–5.1)
Sodium: 137 mmol/L (ref 135–145)
TOTAL PROTEIN: 7.2 g/dL (ref 6.5–8.1)

## 2017-01-02 LAB — CBC WITH DIFFERENTIAL/PLATELET
BASOS ABS: 0 10*3/uL (ref 0.0–0.1)
BASOS PCT: 0 %
Eosinophils Absolute: 0.3 10*3/uL (ref 0.0–0.7)
Eosinophils Relative: 3 %
HEMATOCRIT: 37.8 % — AB (ref 39.0–52.0)
HEMOGLOBIN: 12.4 g/dL — AB (ref 13.0–17.0)
Lymphocytes Relative: 20 %
Lymphs Abs: 1.9 10*3/uL (ref 0.7–4.0)
MCH: 27.6 pg (ref 26.0–34.0)
MCHC: 32.8 g/dL (ref 30.0–36.0)
MCV: 84.2 fL (ref 78.0–100.0)
MONOS PCT: 8 %
Monocytes Absolute: 0.8 10*3/uL (ref 0.1–1.0)
NEUTROS ABS: 6.7 10*3/uL (ref 1.7–7.7)
NEUTROS PCT: 69 %
Platelets: 287 10*3/uL (ref 150–400)
RBC: 4.49 MIL/uL (ref 4.22–5.81)
RDW: 14.3 % (ref 11.5–15.5)
WBC: 9.8 10*3/uL (ref 4.0–10.5)

## 2017-01-02 LAB — URINALYSIS, ROUTINE W REFLEX MICROSCOPIC
Bilirubin Urine: NEGATIVE
GLUCOSE, UA: NEGATIVE mg/dL
HGB URINE DIPSTICK: NEGATIVE
KETONES UR: NEGATIVE mg/dL
Leukocytes, UA: NEGATIVE
Nitrite: NEGATIVE
PH: 5 (ref 5.0–8.0)
PROTEIN: NEGATIVE mg/dL
Specific Gravity, Urine: 1.01 (ref 1.005–1.030)

## 2017-01-02 MED ORDER — TRAMADOL HCL 50 MG PO TABS: 50.0000 mg | ORAL_TABLET | Freq: Four times a day (QID) | ORAL | 0 refills | Status: DC | PRN

## 2017-01-02 MED ORDER — IOPAMIDOL (ISOVUE-300) INJECTION 61%
100.0000 mL | Freq: Once | INTRAVENOUS | Status: AC | PRN
  Administered 2017-01-02: 100 mL via INTRAVENOUS

## 2017-01-02 NOTE — ED Provider Notes (Signed)
Patient seen and evaluated. Discussed with PA. 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 Otherwise benign exam evaluation. His tenderness is at his left costal margin anteriorly to his flank and seems musculoskeletal as it is reproduced with movement and palpation. No vesicles or zoster. He takes naproxen. Will give short term tramadol number of 10 tablets as needed. Otherwise primary care follow-up.   Tanna Furry, MD 01/02/17 1316

## 2017-01-02 NOTE — ED Provider Notes (Signed)
[facility] Provider Note   CSN: [id number] Arrival date & time: 01/02/17  1031     History   Chief Complaint Chief Complaint  Patient presents with  . Abdominal Pain    HPI Mike Hunter is a 66 y.o. male.  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 Pain is worse on the left side, worse with palpation. Patient states that last night his wife felt 2 masses in his lower abdomen. He called his physician today and was told to go directly to the emergency department. No associated fevers, vomiting, diarrhea. Last oral intake was this morning. Patient has had normal appetite. No constipation. No urinary symptoms. Patient does not have a history of hernias. States that the masses are more pronounced when he is standing. Patient does inject a diabetes medicine into the area of his abdomen where this tenderness is occurring. Pain does not seem to be worse with movement. The onset of this condition was acute. The course is constant. Aggravating factors: none. Alleviating factors: none.   Patient feels that his CHF is currently controlled. Denies chest pain or shortness of breath. Lower extremity swelling at baseline.      Past Medical History:  Diagnosis Date  . Asthma   . Bipolar affective ([place])   . Cataract   . CHF (congestive heart failure) ([place])   . Conversion disorder   . Cornea disorder   . Diabetes mellitus   . 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 High cholesterol   . History of methicillin resistant staphylococcus aureus (MRSA)   . Hypertension   . Reflux   . Sleep apnea     Patient Active Problem List   Diagnosis Date Noted  . Type II or unspecified type diabetes mellitus with neurological manifestations, not stated as uncontrolled(250.60) 02/09/2013  . Unspecified urinary incontinence 02/09/2013  . Esophageal reflux  02/09/2013  . Type II or unspecified type diabetes mellitus with peripheral circulatory disorders, uncontrolled(250.72) 12/30/2012  . Pyogenic arthritis of knee ([place]) 12/30/2012    Past Surgical History:  Procedure Laterality Date  . CERVICAL FUSION    . CHOLECYSTECTOMY    . KIDNEY STONE SURGERY    . KNEE ARTHROSCOPY         Home Medications    Prior to Admission medications   Medication Sig Start Date End Date Taking? Authorizing Provider  albuterol (PROVENTIL HFA;VENTOLIN HFA) 108 (90 BASE) MCG/ACT inhaler Inhale 2 puffs into the lungs every 4 (four) hours as needed for wheezing.   Yes [provider]  atorvastatin (LIPITOR) 40 MG tablet Take 40 mg by mouth daily.   Yes [provider]  furosemide (LASIX) 20 MG tablet Take 40 mg by mouth daily.    Yes [provider]  insulin aspart (NOVOLOG FLEXPEN) 100 UNIT/ML injection Inject 5 Units into the skin 3 (three) times daily before meals. 5 units prior to meals for cbg>= 150   Yes [provider]  Insulin Degludec (TRESIBA FLEXTOUCH [place]) Inject 65 Units into the skin 2 (two) times daily.   Yes [provider]  lansoprazole (PREVACID) 30 MG capsule Take 30 mg by mouth 2 (two) times daily.     Yes [provider]  latanoprost (XALATAN) 0.005 % ophthalmic solution Place 1 drop into both eyes at bedtime.     Yes [provider]  metFORMIN (GLUCOPHAGE) 1000 MG  tablet Take 1,000 mg by mouth 2 (two) times daily with a meal.     Yes [provider]  montelukast (SINGULAIR) 10 MG tablet Take 10 mg by mouth at bedtime.   Yes [provider]  naproxen (NAPROSYN) 500 MG tablet Take 500 mg by mouth as needed.   Yes [provider]  sertraline (ZOLOFT) 50 MG tablet Take 50 mg by mouth daily.   Yes [provider]  topiramate (TOPAMAX) 50 MG tablet Take 50 mg by mouth at bedtime.   Yes [provider]  Canagliflozin (INVOKANA) 100 MG TABS Take  100 mg by mouth daily.    [provider]  ferrous sulfate 325 (65 FE) MG tablet Take 325 mg by mouth daily with breakfast.    [provider]  insulin glargine (LANTUS SOLOSTAR) 100 UNIT/ML injection Inject 65 Units into the skin 2 (two) times daily. Give 65 units in the and 60 units in th pm    [provider]  L-methylfolate Calcium 7.5 MG TABS Take 1 tablet by mouth daily.     [provider]  OxyCODONE (OXYCONTIN) 15 mg T12A Take 1 tablet (15 mg total) by mouth every 12 (twelve) hours. 11/11/12   Reed, Tiffany L, DO  oxyCODONE-acetaminophen (PERCOCET/ROXICET) 5-325 MG per tablet Take 1-2 tablets by mouth every 6 (six) hours as needed. Take one to two tablets every four hours as needed for pain. 10/28/12   Reed, Tiffany L, DO  predniSONE (DELTASONE) 20 MG tablet Take 60 mg daily x 2 days then 40 mg daily x 2 day then 20 mg daily x 2 days 11/01/14   Drenda Freeze, MD    Family History No family history on file.  Social History Social History  Substance Use Topics  . Smoking status: Former Research scientist (life sciences)  . Smokeless tobacco: Never Used  . Alcohol use No     Allergies   Codeine and Morphine and related   Review of Systems Review of Systems  Constitutional: Negative for fever.  HENT: Negative for rhinorrhea and sore throat.   Eyes: Negative for redness.  Respiratory: Negative for cough.   Cardiovascular: Negative for chest pain.  Gastrointestinal: Positive for abdominal distention and abdominal pain. Negative for diarrhea, nausea and vomiting.  Genitourinary: Negative for dysuria.  Musculoskeletal: Negative for myalgias.  Skin: Negative for rash.  Neurological: Negative for headaches.     Physical Exam Updated Vital Signs BP (!) 148/73 (BP Location: Left Arm)   Pulse 66   Temp 98.6 F (37 C) (Oral)   Resp 20   Ht 5\' 11"  (1.803 m)   Wt 132.9 kg (293 lb)   SpO2 100%   BMI 40.87 kg/m   Physical Exam  Constitutional: He appears  well-developed and well-nourished.  HENT:  Head: Normocephalic and atraumatic.  Eyes: Conjunctivae are normal. Right eye exhibits no discharge. Left eye exhibits no discharge.  Neck: Normal range of motion. Neck supple.  Cardiovascular: Normal rate, regular rhythm and normal heart sounds.   Pulmonary/Chest: Effort normal and breath sounds normal.  Abdominal: Soft. He exhibits mass. There is tenderness. There is no guarding.  Generalized abdominal tenderness, worse in the periumbilical area. Patient does have non-distinct soft tissue firmness periumbilical areas. No definitive hernia. No overlying redness or erythema.  Neurological: He is alert.  Skin: Skin is warm and dry.  Psychiatric: He has a normal mood and affect.  Nursing note and vitals reviewed.    ED Treatments / Results  Labs (  all labs ordered are listed, but only abnormal results are displayed) Labs Reviewed  CBC WITH DIFFERENTIAL/PLATELET - Abnormal; Notable for the following:       Result Value   Hemoglobin 12.4 (*)    HCT 37.8 (*)    All other components within normal limits  COMPREHENSIVE METABOLIC PANEL - Abnormal; Notable for the following:    Glucose, Bld 170 (*)    BUN 27 (*)    All other components within normal limits  URINALYSIS, ROUTINE W REFLEX MICROSCOPIC    EKG  EKG Interpretation None       Radiology Ct Abdomen Pelvis W Contrast  Result Date: 01/02/2017 CLINICAL DATA:  Hard painful knots at RIGHT and LEFT mid abdomen noticed 1 month ago, tender mass LEFT periumbilical, history diabetes mellitus, hypertension, asthma, reflux, CHF, former smoker EXAM: CT ABDOMEN AND PELVIS WITH CONTRAST TECHNIQUE: Multidetector CT imaging of the abdomen and pelvis was performed using the standard protocol following bolus administration of intravenous contrast. Sagittal and coronal MPR images reconstructed from axial data set. CONTRAST:  128mL ISOVUE-300 IOPAMIDOL (ISOVUE-300) INJECTION 61% IV. Dilute oral contrast.  COMPARISON:  08/18/2012 FINDINGS: Lower chest: Lung bases clear Hepatobiliary: Gallbladder surgically absent. Liver unremarkable. No biliary dilatation. Pancreas: Normal appearance Spleen: Normal appearance. Several small splenules LEFT upper quadrant. Adrenals/Urinary Tract: Adrenal glands normal appearance. BILATERAL renal cysts LEFT peripelvic 5.1 x 3.8 cm and lateral upper pole RIGHT kidney 1.8 x 1.7 cm. No hydronephrosis or hydroureter. Nonobstructing tiny LEFT renal calculi. Question prior prostatic resection or TURP. Bladder otherwise unremarkable. Stomach/Bowel: Appendix not visualized. Stomach and bowel loops unremarkable. Vascular/Lymphatic: Minimal atherosclerotic calcification aorta and iliac arteries without aneurysm. Normal sized pelvic and inguinal lymph nodes without definite adenopathy. Reproductive: N/A Other: Umbilical hernia containing fat. No free air or free fluid. Lenticular areas of subcutaneous soft tissue infiltration are seen in the anterior abdominal wall bilaterally in the supraumbilical region, corresponding to vitamin-E capsules placed to mark the sites of palpable concern. On the RIGHT this measures 5.6 x 4.4 x 1.0 cm and on the LEFT measures 4.2 x 1.1 x 4.8 cm. These appear extrafascial without associated hernia, calcification or fluid collection. These are progressive since the prior study of 2014. Musculoskeletal:  No acute osseous findings. IMPRESSION: Small umbilical hernia containing fat. BILATERAL renal cysts and nonobstructing LEFT renal calculi. No acute intra-abdominal or intrapelvic abnormalities. Lenticular areas of subcutaneous infiltrative change in the anterior abdominal walls bilaterally and supraumbilical region adjacent to the anterior surface of the anterior abdominal wall fascia, corresponding to the sites of palpable concern. 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 These appear progressive since the prior study of 2014. Electronically Signed   By: Lavonia Dana M.D.   On: 01/02/2017 12:59    Procedures Procedures (including critical care time)  Medications Ordered in ED Medications - No data to display   Initial Impression / Assessment and Plan / ED Course  I have reviewed the triage vital signs and the nursing notes.  Pertinent labs & imaging results that were available during my care of the patient were reviewed by me and considered in my medical decision making (see chart for details).     Patient seen and examined. Labs/imaging ordered.   Vital signs reviewed and are as follows: BP (!) 148/73 (BP Location: Left Arm)   Pulse 66   Temp 98.6 F (37 C) (Oral)   Resp 20  Ht 5\' 11"  (1.803 m)   Wt 132.9 kg (293 lb)   SpO2 100%   BMI 40.87 kg/m   Discussed with Dr. Jeneen Rinks who will see.    1:24 PM Pt updated on CT results by Dr. Jeneen Rinks, who rx Tramadol and discussed return instructions with patient.   Final Clinical Impressions(s) / ED Diagnoses   Final diagnoses:  Musculoskeletal pain   Patient with abdominal pain. Vitals are stable, no fever. Labs reassuring. Imaging without acute findings. No incarcerated hernia. Masses likely due to injection site reaction. No signs of dehydration, patient is tolerating PO's. Lungs are clear and no signs suggestive of PNA. Low concern for appendicitis, cholecystitis, pancreatitis, ruptured viscus, UTI, kidney stone, aortic dissection, aortic aneurysm or other emergent abdominal etiology. Supportive therapy indicated with return if symptoms worsen.    New Prescriptions Discharge Medication List as of 01/02/2017  1:15 PM    START taking these medications   Details  traMADol (ULTRAM) 50 MG tablet Take 1 tablet (50 mg total) by mouth every 6 (six) hours as needed., Starting Fri 01/02/2017, Print         Carlisle Cater, PA-C 01/02/17 1325    Tanna Furry, MD 01/04/17 [phone]

## 2017-01-02 NOTE — ED Notes (Signed)
Patient transported to CT 

## 2017-01-02 NOTE — ED Notes (Signed)
ED Provider at bedside. [facility])

## 2017-01-02 NOTE — Discharge Instructions (Signed)
No concerning findings on your lab ad CT scan today. Pain in your side seems to be from muscle or bone. Continue Naproxen Rx for tramadol as needed for pain.

## 2017-01-02 NOTE — ED Triage Notes (Signed)
Pt reports he has chronic bloating and digestive problems. 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 Over the last few days pt has been having increased pain. Now he has two firm areas in his abdomen that are more pronounced when he stands up

## 2017-09-13 ENCOUNTER — Other Ambulatory Visit: Payer: Self-pay

## 2017-09-13 ENCOUNTER — Encounter (HOSPITAL_BASED_OUTPATIENT_CLINIC_OR_DEPARTMENT_OTHER): Payer: Self-pay | Admitting: Emergency Medicine

## 2017-09-13 ENCOUNTER — Emergency Department (HOSPITAL_BASED_OUTPATIENT_CLINIC_OR_DEPARTMENT_OTHER)
Admission: EM | Admit: 2017-09-13 | Discharge: 2017-09-13 | Disposition: A | Discharge status: Home / Self Care | Payer: Medicare HMO | Attending: Emergency Medicine | Admitting: Emergency Medicine

## 2017-09-13 ENCOUNTER — Emergency Department (HOSPITAL_BASED_OUTPATIENT_CLINIC_OR_DEPARTMENT_OTHER): Payer: Medicare HMO

## 2017-09-13 DIAGNOSIS — Z87891 Personal history of nicotine dependence: Secondary | ICD-10-CM | POA: Diagnosis not present

## 2017-09-13 DIAGNOSIS — R062 Wheezing: Secondary | ICD-10-CM | POA: Diagnosis not present

## 2017-09-13 DIAGNOSIS — I509 Heart failure, unspecified: Secondary | ICD-10-CM | POA: Insufficient documentation

## 2017-09-13 DIAGNOSIS — J069 Acute upper respiratory infection, unspecified: Secondary | ICD-10-CM | POA: Insufficient documentation

## 2017-09-13 DIAGNOSIS — Z79899 Other long term (current) drug therapy: Secondary | ICD-10-CM | POA: Diagnosis not present

## 2017-09-13 DIAGNOSIS — I110 Hypertensive heart disease with heart failure: Secondary | ICD-10-CM | POA: Insufficient documentation

## 2017-09-13 DIAGNOSIS — R05 Cough: Secondary | ICD-10-CM | POA: Insufficient documentation

## 2017-09-13 DIAGNOSIS — E119 Type 2 diabetes mellitus without complications: Secondary | ICD-10-CM | POA: Diagnosis not present

## 2017-09-13 DIAGNOSIS — J45909 Unspecified asthma, uncomplicated: Secondary | ICD-10-CM | POA: Insufficient documentation

## 2017-09-13 DIAGNOSIS — Z794 Long term (current) use of insulin: Secondary | ICD-10-CM | POA: Insufficient documentation

## 2017-09-13 DIAGNOSIS — R0602 Shortness of breath: Secondary | ICD-10-CM

## 2017-09-13 DIAGNOSIS — R2243 Localized swelling, mass and lump, lower limb, bilateral: Secondary | ICD-10-CM | POA: Insufficient documentation

## 2017-09-13 LAB — CBC
HCT: 40.6 % (ref 39.0–52.0)
Hemoglobin: 13.1 g/dL (ref 13.0–17.0)
MCH: 27.6 pg (ref 26.0–34.0)
MCHC: 32.3 g/dL (ref 30.0–36.0)
MCV: 85.5 fL (ref 78.0–100.0)
PLATELETS: 357 10*3/uL (ref 150–400)
RBC: 4.75 MIL/uL (ref 4.22–5.81)
RDW: 14.6 % (ref 11.5–15.5)
WBC: 18.6 10*3/uL — AB (ref 4.0–10.5)

## 2017-09-13 LAB — BASIC METABOLIC PANEL
Anion gap: 11 (ref 5–15)
BUN: 27 mg/dL — ABNORMAL HIGH (ref 6–20)
CHLORIDE: 108 mmol/L (ref 101–111)
CO2: 19 mmol/L — ABNORMAL LOW (ref 22–32)
CREATININE: 1.09 mg/dL (ref 0.61–1.24)
Calcium: 9.1 mg/dL (ref 8.9–10.3)
Glucose, Bld: 203 mg/dL — ABNORMAL HIGH (ref 65–99)
Potassium: 3.7 mmol/L (ref 3.5–5.1)
SODIUM: 138 mmol/L (ref 135–145)

## 2017-09-13 LAB — TROPONIN I: Troponin I: <0.03 ng/mL (ref <0.03)

## 2017-09-13 LAB — BRAIN NATRIURETIC PEPTIDE: B NATRIURETIC PEPTIDE 5: 126.3 pg/mL — AB (ref 0.0–100.0)

## 2017-09-13 NOTE — ED Notes (Signed)
Rt at bedside to assess

## 2017-09-13 NOTE — ED Notes (Signed)
Pt stated he did not want a breathing tx at this time (in regards to previous discussion with EDP)

## 2017-09-13 NOTE — ED Notes (Signed)
ED Provider at bedside. 

## 2017-09-13 NOTE — ED Triage Notes (Signed)
Patient states that he is having trouble breathing and wheezing. The patient saw his PMD on wed and he continues to worsening - Patient was started on Prednisone by his regular dr

## 2017-09-13 NOTE — ED Notes (Signed)
Patient transported to X-ray 

## 2017-09-13 NOTE — ED Provider Notes (Signed)
[facility] EMERGENCY DEPARTMENT Provider Note   CSN: [id number] Arrival date & time: 09/13/17  1453     History   Chief Complaint Chief Complaint  Patient presents with  . Shortness of Breath    HPI Mike Hunter is a 67 y.o. male.  67 year old male with past medical history including hypertension, hyperlipidemia, type 2 diabetes mellitus, asthma, dCHF who p/w shortness of breath and wheezing.  4 days ago, the patient presented to his PCP after several days of cough associated with intermittent wheezing and shortness of breath.  He was started on a prednisone taper, today is day 5, as well as amoxicillin.  He has been taking these medications but continues to have a cough and chest tightness with hurting that is constant.  He has been using albuterol every 4 hours.  He endorses some lower extremity edema, paroxysmal nocturnal dyspnea, and orthopnea.  No major weight gain.  He denies any fevers, vomiting, or sick contacts.  No recent travel or history of blood clots.  He is scheduled to see his cardiologist in 2 days.  His last heart catheterization was 2-3 years ago.  The history is provided by the patient.  Shortness of Breath     Past Medical History:  Diagnosis Date  . Asthma   . Bipolar affective ([place])   . Cataract   . CHF (congestive heart failure) ([place])   . Conversion disorder   . Cornea disorder   . Diabetes mellitus   . 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 High cholesterol   . History of methicillin resistant staphylococcus aureus (MRSA)   . Hypertension   . Reflux   . Sleep apnea     Patient Active Problem List   Diagnosis Date Noted  . Type II or unspecified type diabetes mellitus with neurological manifestations, not stated as uncontrolled(250.60) 02/09/2013  . Unspecified urinary incontinence 02/09/2013  . Esophageal reflux 02/09/2013  . Type II or unspecified type diabetes mellitus with peripheral circulatory disorders,  uncontrolled(250.72) 12/30/2012  . Pyogenic arthritis of knee ([place]) 12/30/2012    Past Surgical History:  Procedure Laterality Date  . CERVICAL FUSION    . CHOLECYSTECTOMY    . KIDNEY STONE SURGERY    . KNEE ARTHROSCOPY          Home Medications    Prior to Admission medications   Medication Sig Start Date End Date Taking? Authorizing Provider  albuterol (PROVENTIL HFA;VENTOLIN HFA) 108 (90 BASE) MCG/ACT inhaler Inhale 2 puffs into the lungs every 4 (four) hours as needed for wheezing.   Yes [provider]  atorvastatin (LIPITOR) 40 MG tablet Take 40 mg by mouth daily.   Yes [provider]  carvedilol (COREG) 25 MG tablet Take 25 mg by mouth 2 (two) times daily with a meal.   Yes [provider]  donepezil (ARICEPT) 10 MG tablet Take 10 mg by mouth at bedtime.   Yes [provider]  furosemide (LASIX) 20 MG tablet Take 40 mg by mouth daily.    Yes [provider]  insulin aspart (NOVOLOG FLEXPEN) 100 UNIT/ML injection Inject 5 Units into the skin 3 (three) times daily before meals. 5 units prior to meals for cbg>= 150   Yes [provider]  Insulin Degludec (TRESIBA FLEXTOUCH [place]) Inject 65 Units into the skin 2 (two) times daily.   Yes [provider]  latanoprost (XALATAN) 0.005 % ophthalmic solution Place 1 drop into both eyes at bedtime.  Yes [provider]  losartan (COZAAR) 25 MG tablet Take 25 mg by mouth daily.   Yes [provider]  memantine (NAMENDA) 5 MG tablet Take 5 mg by mouth 2 (two) times daily.   Yes [provider]  metFORMIN (GLUCOPHAGE) 1000 MG tablet Take 1,000 mg by mouth 2 (two) times daily with a meal.     Yes [provider]  montelukast (SINGULAIR) 10 MG tablet Take 10 mg by mouth at bedtime.   Yes [provider]  omeprazole (PRILOSEC) 40 MG capsule Take 40 mg by mouth daily.   Yes [provider]  predniSONE (DELTASONE) 20 MG tablet  Take 60 mg daily x 2 days then 40 mg daily x 2 day then 20 mg daily x 2 days 11/01/14  Yes Drenda Freeze, MD  sertraline (ZOLOFT) 50 MG tablet Take 50 mg by mouth daily.   Yes [provider]  topiramate (TOPAMAX) 50 MG tablet Take 50 mg by mouth at bedtime.   Yes [provider]  trolamine salicylate (ASPERCREME) 10 % cream Apply 1 application topically as needed for muscle pain.   Yes [provider]  Canagliflozin (INVOKANA) 100 MG TABS Take 100 mg by mouth daily.    [provider]  ferrous sulfate 325 (65 FE) MG tablet Take 325 mg by mouth daily with breakfast.    [provider]  insulin glargine (LANTUS SOLOSTAR) 100 UNIT/ML injection Inject 65 Units into the skin 2 (two) times daily. Give 65 units in the and 60 units in th pm    [provider]  L-methylfolate Calcium 7.5 MG TABS Take 1 tablet by mouth daily.     [provider]  lansoprazole (PREVACID) 30 MG capsule Take 30 mg by mouth 2 (two) times daily.      [provider]  naproxen (NAPROSYN) 500 MG tablet Take 500 mg by mouth as needed.    [provider]  OxyCODONE (OXYCONTIN) 15 mg T12A Take 1 tablet (15 mg total) by mouth every 12 (twelve) hours. 11/11/12   Reed, Tiffany L, DO  oxyCODONE-acetaminophen (PERCOCET/ROXICET) 5-325 MG per tablet Take 1-2 tablets by mouth every 6 (six) hours as needed. Take one to two tablets every four hours as needed for pain. 10/28/12   Reed, Tiffany L, DO  traMADol (ULTRAM) 50 MG tablet Take 1 tablet (50 mg total) by mouth every 6 (six) hours as needed. 01/02/17   Tanna Furry, MD    Family History History reviewed. No pertinent family history.  Social History Social History   Tobacco Use  . Smoking status: Former Research scientist (life sciences)  . Smokeless tobacco: Never Used  Substance Use Topics  . Alcohol use: No  . Drug use: No     Allergies   Codeine and Morphine and related   Review of Systems Review of Systems    Respiratory: Positive for shortness of breath.    All other systems reviewed and are negative except that which was mentioned in HPI   Physical Exam Updated Vital Signs BP (!) 129/56   Pulse 66   Temp 98.1 F (36.7 C) (Oral)   Resp 20   Ht 5\' 11"  (1.803 m)   Wt 132.9 kg (293 lb)   SpO2 98%   BMI 40.87 kg/m   Physical Exam  Constitutional: He is oriented to person, place, and time. He appears well-developed and well-nourished. No distress.  HENT:  Head: Normocephalic and atraumatic.  Moist mucous membranes  Eyes: Pupils  are equal, round, and reactive to light. Conjunctivae are normal.  Neck: Neck supple.  Cardiovascular: Normal rate, regular rhythm and normal heart sounds.  No murmur heard. Pulmonary/Chest:  Very mild increased WOB, upper wheezing that appears volitional during pulmonary exam, resolves when I stop auscultating, no significant wheezing in lower lung fields  Abdominal: Soft. Bowel sounds are normal. He exhibits no distension. There is no tenderness.  Musculoskeletal:       Right lower leg: He exhibits edema.       Left lower leg: He exhibits edema.  Trace BLE edema  Neurological: He is alert and oriented to person, place, and time.  Fluent speech  Skin: Skin is warm and dry.  Psychiatric: He has a normal mood and affect. Judgment normal.  Nursing note and vitals reviewed.    ED Treatments / Results  Labs (all labs ordered are listed, but only abnormal results are displayed) Labs Reviewed  BASIC METABOLIC PANEL - Abnormal; Notable for the following components:      Result Value   CO2 19 (*)    Glucose, Bld 203 (*)    BUN 27 (*)    All other components within normal limits  CBC - Abnormal; Notable for the following components:   WBC 18.6 (*)    All other components within normal limits  BRAIN NATRIURETIC PEPTIDE - Abnormal; Notable for the following components:   B Natriuretic Peptide 126.3 (*)    All other components within normal limits   TROPONIN I    EKG EKG Interpretation  Date/Time:  Sunday September 13 2017 15:11:13 EDT Ventricular Rate:  68 PR Interval:    QRS Duration: 109 QT Interval:  398 QTC Calculation: 424 R Axis:   68 Text Interpretation:  Sinus rhythm Low voltage, precordial leads Nonspecific T abnormalities, lateral leads rate slower than previous, otherwise similar Confirmed by Theotis Burrow [phone]) on 09/13/2017 3:17:54 PM   Radiology Dg Chest 2 View  Result Date: 09/13/2017 CLINICAL DATA:  Shortness of breath with exertion.  Cough. EXAM: CHEST - 2 VIEW COMPARISON:  09/08/2017. FINDINGS: The heart size and mediastinal contours are within normal limits. Both lungs are clear. The visualized skeletal structures are unremarkable. IMPRESSION: No active cardiopulmonary disease. Electronically Signed   By: Kerby Moors M.D.   On: 09/13/2017 15:53    Procedures Procedures (including critical care time)  Medications Ordered in ED Medications - No data to display   Initial Impression / Assessment and Plan / ED Course  I have reviewed the triage vital signs and the nursing notes.  Pertinent labs & imaging results that were available during my care of the patient were reviewed by me and considered in my medical decision making (see chart for details).     PT nontoxic on exam w/ reassuring VS. He had some wheezing w/ auscultation that actually seemed more volitional, was coming from upper airways rather than lower lungs.  These noises stopped when I stopped pulmonary exam. eKG without ischemic changes. CXR normal.  Lab work shows negative troponin, BNP 126, CBC notable only for WBC 18.6 likely from his prednisone use.  Glucose 203, normal Cr. He has no signs or sx of volume overload to suggest CHF exacerbation. He is currently on appropriate therapy for asthma exacerbation and has  No respiratory distress today to suggest outpatient treatment failure.  I reviewed his cardiology note from December 2018.  During  that note, cardiologist documents similar symptoms of dyspnea.  He reportedly had recent negative stress  testing at that time and "prior unremarkable catherizations." He has appt with cardiologist in 2 days.  Even that his SOB symptoms seem to be associated more with his cough/URI symptoms, I feel ACS is unlikely and I he is safe for discharge with his cardiologist following along closely.  I discussed continuing all of his current medications including steroids and amoxicillin.  Extensively reviewed return precautions.  He and Mike Hunter voiced understanding.  Final Clinical Impressions(s) / ED Diagnoses   Final diagnoses:  URI with cough and congestion  SOB (shortness of breath)    ED Discharge Orders    None       Roch Quach, Wenda Overland, MD 09/13/17 [phone]

## 2017-09-13 NOTE — Discharge Instructions (Signed)
Continue all your medications as prescribed including steroid and amoxicillin course.  You may use albuterol every 4-6 hours as needed for shortness of breath and wheezing.  Follow-up with your cardiologist this week regarding your symptoms.  Return to the ER if you have any worsening breathing difficulty, fever, severe chest pain, or sudden change in symptoms.

## 2018-09-25 ENCOUNTER — Inpatient Hospital Stay (HOSPITAL_BASED_OUTPATIENT_CLINIC_OR_DEPARTMENT_OTHER)
Admission: EM | Admit: 2018-09-25 | Discharge: 2018-09-28 | DRG: 683 | Disposition: A | Discharge status: Home Health | Payer: Medicare HMO | Attending: Internal Medicine | Admitting: Internal Medicine

## 2018-09-25 ENCOUNTER — Other Ambulatory Visit: Payer: Self-pay

## 2018-09-25 ENCOUNTER — Encounter (HOSPITAL_BASED_OUTPATIENT_CLINIC_OR_DEPARTMENT_OTHER): Payer: Self-pay | Admitting: Emergency Medicine

## 2018-09-25 ENCOUNTER — Emergency Department (HOSPITAL_BASED_OUTPATIENT_CLINIC_OR_DEPARTMENT_OTHER): Payer: Medicare HMO

## 2018-09-25 DIAGNOSIS — E876 Hypokalemia: Secondary | ICD-10-CM | POA: Diagnosis present

## 2018-09-25 DIAGNOSIS — G4733 Obstructive sleep apnea (adult) (pediatric): Secondary | ICD-10-CM | POA: Diagnosis present

## 2018-09-25 DIAGNOSIS — F418 Other specified anxiety disorders: Secondary | ICD-10-CM | POA: Diagnosis present

## 2018-09-25 DIAGNOSIS — Z87442 Personal history of urinary calculi: Secondary | ICD-10-CM

## 2018-09-25 DIAGNOSIS — N1832 Chronic kidney disease, stage 3b: Secondary | ICD-10-CM | POA: Diagnosis present

## 2018-09-25 DIAGNOSIS — Z888 Allergy status to other drugs, medicaments and biological substances status: Secondary | ICD-10-CM

## 2018-09-25 DIAGNOSIS — E669 Obesity, unspecified: Secondary | ICD-10-CM | POA: Diagnosis present

## 2018-09-25 DIAGNOSIS — Z87891 Personal history of nicotine dependence: Secondary | ICD-10-CM

## 2018-09-25 DIAGNOSIS — E7800 Pure hypercholesterolemia, unspecified: Secondary | ICD-10-CM | POA: Diagnosis present

## 2018-09-25 DIAGNOSIS — E1149 Type 2 diabetes mellitus with other diabetic neurological complication: Secondary | ICD-10-CM | POA: Diagnosis present

## 2018-09-25 DIAGNOSIS — Z981 Arthrodesis status: Secondary | ICD-10-CM

## 2018-09-25 DIAGNOSIS — Z79899 Other long term (current) drug therapy: Secondary | ICD-10-CM

## 2018-09-25 DIAGNOSIS — Z6839 Body mass index (BMI) 39.0-39.9, adult: Secondary | ICD-10-CM

## 2018-09-25 DIAGNOSIS — F319 Bipolar disorder, unspecified: Secondary | ICD-10-CM | POA: Diagnosis present

## 2018-09-25 DIAGNOSIS — I493 Ventricular premature depolarization: Secondary | ICD-10-CM | POA: Diagnosis present

## 2018-09-25 DIAGNOSIS — Z79891 Long term (current) use of opiate analgesic: Secondary | ICD-10-CM

## 2018-09-25 DIAGNOSIS — Z8614 Personal history of Methicillin resistant Staphylococcus aureus infection: Secondary | ICD-10-CM

## 2018-09-25 DIAGNOSIS — I2510 Atherosclerotic heart disease of native coronary artery without angina pectoris: Secondary | ICD-10-CM | POA: Diagnosis present

## 2018-09-25 DIAGNOSIS — K219 Gastro-esophageal reflux disease without esophagitis: Secondary | ICD-10-CM | POA: Diagnosis present

## 2018-09-25 DIAGNOSIS — J449 Chronic obstructive pulmonary disease, unspecified: Secondary | ICD-10-CM | POA: Diagnosis present

## 2018-09-25 DIAGNOSIS — I25119 Atherosclerotic heart disease of native coronary artery with unspecified angina pectoris: Secondary | ICD-10-CM

## 2018-09-25 DIAGNOSIS — Z9049 Acquired absence of other specified parts of digestive tract: Secondary | ICD-10-CM

## 2018-09-25 DIAGNOSIS — I110 Hypertensive heart disease with heart failure: Secondary | ICD-10-CM | POA: Diagnosis present

## 2018-09-25 DIAGNOSIS — Z791 Long term (current) use of non-steroidal anti-inflammatories (NSAID): Secondary | ICD-10-CM

## 2018-09-25 DIAGNOSIS — N179 Acute kidney failure, unspecified: Secondary | ICD-10-CM | POA: Diagnosis not present

## 2018-09-25 DIAGNOSIS — Z885 Allergy status to narcotic agent status: Secondary | ICD-10-CM

## 2018-09-25 DIAGNOSIS — H269 Unspecified cataract: Secondary | ICD-10-CM | POA: Diagnosis present

## 2018-09-25 DIAGNOSIS — I5032 Chronic diastolic (congestive) heart failure: Secondary | ICD-10-CM | POA: Diagnosis not present

## 2018-09-25 DIAGNOSIS — I5033 Acute on chronic diastolic (congestive) heart failure: Secondary | ICD-10-CM | POA: Diagnosis present

## 2018-09-25 DIAGNOSIS — Z794 Long term (current) use of insulin: Secondary | ICD-10-CM

## 2018-09-25 DIAGNOSIS — G4730 Sleep apnea, unspecified: Secondary | ICD-10-CM | POA: Diagnosis present

## 2018-09-25 DIAGNOSIS — J45902 Unspecified asthma with status asthmaticus: Secondary | ICD-10-CM

## 2018-09-25 DIAGNOSIS — E119 Type 2 diabetes mellitus without complications: Secondary | ICD-10-CM

## 2018-09-25 LAB — MAGNESIUM: Magnesium: 1.5 mg/dL — ABNORMAL LOW (ref 1.7–2.4)

## 2018-09-25 LAB — TROPONIN I: Troponin I: <0.03 ng/mL (ref <0.03)

## 2018-09-25 LAB — COMPREHENSIVE METABOLIC PANEL
ALT: 18 U/L (ref 0–44)
AST: 18 U/L (ref 15–41)
Albumin: 3.9 g/dL (ref 3.5–5.0)
Alkaline Phosphatase: 84 U/L (ref 38–126)
Anion gap: 16 — ABNORMAL HIGH (ref 5–15)
BUN: 62 mg/dL — ABNORMAL HIGH (ref 8–23)
CO2: 31 mmol/L (ref 22–32)
Calcium: 9.4 mg/dL (ref 8.9–10.3)
Chloride: 88 mmol/L — ABNORMAL LOW (ref 98–111)
Creatinine, Ser: 1.91 mg/dL — ABNORMAL HIGH (ref 0.61–1.24)
GFR calc Af Amer: 41 mL/min — ABNORMAL LOW (ref >60)
GFR calc non Af Amer: 35 mL/min — ABNORMAL LOW (ref >60)
Glucose, Bld: 245 mg/dL — ABNORMAL HIGH (ref 70–99)
Potassium: 2.5 mmol/L — CL (ref 3.5–5.1)
Sodium: 135 mmol/L (ref 135–145)
Total Bilirubin: 0.6 mg/dL (ref 0.3–1.2)
Total Protein: 8 g/dL (ref 6.5–8.1)

## 2018-09-25 LAB — CBC
HCT: 40.8 % (ref 39.0–52.0)
Hemoglobin: 13.6 g/dL (ref 13.0–17.0)
MCH: 29.1 pg (ref 26.0–34.0)
MCHC: 33.3 g/dL (ref 30.0–36.0)
MCV: 87.2 fL (ref 80.0–100.0)
Platelets: 332 10*3/uL (ref 150–400)
RBC: 4.68 MIL/uL (ref 4.22–5.81)
RDW: 13.3 % (ref 11.5–15.5)
WBC: 13.8 10*3/uL — ABNORMAL HIGH (ref 4.0–10.5)
nRBC: 0 % (ref 0.0–0.2)

## 2018-09-25 LAB — BRAIN NATRIURETIC PEPTIDE: B Natriuretic Peptide: 35.8 pg/mL (ref 0.0–100.0)

## 2018-09-25 LAB — PHOSPHORUS: Phosphorus: 3.8 mg/dL (ref 2.5–4.6)

## 2018-09-25 MED ORDER — POTASSIUM CHLORIDE CRYS ER 20 MEQ PO TBCR
40.0000 meq | EXTENDED_RELEASE_TABLET | Freq: Once | ORAL | Status: AC
  Administered 2018-09-25: 20:00:00 40 meq via ORAL
  Filled 2018-09-25: qty 2

## 2018-09-25 MED ORDER — POTASSIUM CHLORIDE 10 MEQ/100ML IV SOLN
10.0000 meq | Freq: Once | INTRAVENOUS | Status: AC
  Administered 2018-09-25: 20:00:00 10 meq via INTRAVENOUS
  Filled 2018-09-25: qty 100

## 2018-09-25 NOTE — ED Notes (Signed)
In to assist pt with beeping IV pump. Pt c/o anterior chest pain. States he has had this before but new this visit. No distress. Dr. Venora Maples aware and orders received for EKG. Rates 8/10

## 2018-09-25 NOTE — ED Triage Notes (Signed)
Patient states that he was called by his PMD and told to come in because his potatssium was low - 2.7

## 2018-09-25 NOTE — ED Provider Notes (Signed)
[facility] EMERGENCY DEPARTMENT Provider Note   CSN: [id number] Arrival date & time: 09/25/18  1716    History   Chief Complaint Chief Complaint  Patient presents with  . Abnormal Lab    HPI Mike Hunter is a 68 y.o. male.     HPI Patient is a 68 year old male with a history of congestive heart failure who was sent to the emergency department for reported hypokalemia as tested as an outpatient.  He has been following up closely with his physicians for increasing cough and shortness of breath and intermittent chest pain worse over the past several weeks but present over the past several months.  His weight has been fluctuating.  He is on torsemide 20 mg p.o. twice daily.  He reports that he is on potassium 3 times a day as well.  He feels slightly weak.  He reports a sensation of generalized swelling.  He denies unilateral leg swelling.  No active chest pain at this time.  He states he did have some chest tightness and pressure last week with some to the left arm and shoulder with associated nausea and vomiting.  He did not seek evaluation at that time.  He reports a history of heart catheterization before in the past without stenting.  He states the last time he was told he had "minimal disease".   Past Medical History:  Diagnosis Date  . Asthma   . Bipolar affective ([place])   . Cataract   . CHF (congestive heart failure) ([place])   . Conversion disorder   . Cornea disorder   . Diabetes mellitus   . 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 High cholesterol   . History of methicillin resistant staphylococcus aureus (MRSA)   . Hypertension   . Reflux   . Sleep apnea     Patient Active Problem List   Diagnosis Date Noted  . AKI (acute kidney injury) ([place]) 09/25/2018  . Type II or unspecified type diabetes mellitus with neurological manifestations, not stated as uncontrolled(250.60) 02/09/2013  . Unspecified urinary incontinence 02/09/2013  . Esophageal reflux  02/09/2013  . Type II or unspecified type diabetes mellitus with peripheral circulatory disorders, uncontrolled(250.72) 12/30/2012  . Pyogenic arthritis of knee ([place]) 12/30/2012    Past Surgical History:  Procedure Laterality Date  . CERVICAL FUSION    . CHOLECYSTECTOMY    . KIDNEY STONE SURGERY    . KNEE ARTHROSCOPY          Home Medications    Prior to Admission medications   Medication Sig Start Date End Date Taking? Authorizing Provider  albuterol (PROVENTIL HFA;VENTOLIN HFA) 108 (90 BASE) MCG/ACT inhaler Inhale 2 puffs into the lungs every 4 (four) hours as needed for wheezing.    [provider]  atorvastatin (LIPITOR) 40 MG tablet Take 40 mg by mouth daily.    [provider]  Canagliflozin (INVOKANA) 100 MG TABS Take 100 mg by mouth daily.    [provider]  carvedilol (COREG) 25 MG tablet Take 25 mg by mouth 2 (two) times daily with a meal.    [provider]  donepezil (ARICEPT) 10 MG tablet Take 10 mg by mouth at bedtime.    [provider]  ferrous sulfate 325 (65 FE) MG tablet Take 325 mg by mouth daily with breakfast.    [provider]  furosemide (LASIX) 20 MG tablet Take 40 mg by mouth daily.     [provider]  insulin aspart (NOVOLOG FLEXPEN) 100 UNIT/ML injection Inject 5 Units into the skin 3 (three) times daily before meals. 5 units prior to meals for cbg>= 150    [provider]  Insulin Degludec (TRESIBA FLEXTOUCH [place]) Inject 65 Units into the skin 2 (two) times daily.    [provider]  insulin glargine (LANTUS SOLOSTAR) 100 UNIT/ML injection Inject 65 Units into the skin 2 (two) times daily. Give 65 units in the and 60 units in th pm    [provider]  L-methylfolate Calcium 7.5 MG TABS Take 1 tablet by mouth daily.     [provider]  lansoprazole (PREVACID) 30 MG capsule Take 30 mg by mouth 2 (two) times daily.      [provider]  latanoprost  (XALATAN) 0.005 % ophthalmic solution Place 1 drop into both eyes at bedtime.      [provider]  losartan (COZAAR) 25 MG tablet Take 25 mg by mouth daily.    [provider]  memantine (NAMENDA) 5 MG tablet Take 5 mg by mouth 2 (two) times daily.    [provider]  metFORMIN (GLUCOPHAGE) 1000 MG tablet Take 1,000 mg by mouth 2 (two) times daily with a meal.      [provider]  montelukast (SINGULAIR) 10 MG tablet Take 10 mg by mouth at bedtime.    [provider]  naproxen (NAPROSYN) 500 MG tablet Take 500 mg by mouth as needed.    [provider]  omeprazole (PRILOSEC) 40 MG capsule Take 40 mg by mouth daily.    [provider]  OxyCODONE (OXYCONTIN) 15 mg T12A Take 1 tablet (15 mg total) by mouth every 12 (twelve) hours. 11/11/12   Reed, Tiffany L, DO  oxyCODONE-acetaminophen (PERCOCET/ROXICET) 5-325 MG per tablet Take 1-2 tablets by mouth every 6 (six) hours as needed. Take one to two tablets every four hours as needed for pain. 10/28/12   Reed, Tiffany L, DO  predniSONE (DELTASONE) 20 MG tablet Take 60 mg daily x 2 days then 40 mg daily x 2 day then 20 mg daily x 2 days 11/01/14   Drenda Freeze, MD  sertraline (ZOLOFT) 50 MG tablet Take 50 mg by mouth daily.    [provider]  topiramate (TOPAMAX) 50 MG tablet Take 50 mg by mouth at bedtime.    [provider]  traMADol (ULTRAM) 50 MG tablet Take 1 tablet (50 mg total) by mouth every 6 (six) hours as needed. 01/02/17   Tanna Furry, MD  trolamine salicylate (ASPERCREME) 10 % cream Apply 1 application topically as needed for muscle pain.    [provider]    Family History History reviewed. No pertinent family history.  Social History Social History   Tobacco Use  . Smoking status: Former Research scientist (life sciences)  . Smokeless tobacco: Never Used  Substance Use Topics  . Alcohol use: No  . Drug use: No     Allergies   Codeine and Morphine and related    Review of Systems Review of Systems  All other systems reviewed and are negative.    Physical Exam Updated Vital Signs BP 139/73 (BP Location: Left Arm)   Pulse 91   Resp 18   Ht 5\' 11"  (1.803 m)   Wt 127 kg   SpO2 100%   BMI 39.05 kg/m   Physical Exam Vitals signs and nursing note reviewed.  Constitutional:      Appearance: He is well-developed.  HENT:  Head: Normocephalic and atraumatic.  Neck:     Musculoskeletal: Normal range of motion.  Cardiovascular:     Rate and Rhythm: Normal rate and regular rhythm.     Heart sounds: Normal heart sounds.  Pulmonary:     Effort: Pulmonary effort is normal. No respiratory distress.     Breath sounds: Normal breath sounds.  Abdominal:     General: There is no distension.     Palpations: Abdomen is soft.     Tenderness: There is no abdominal tenderness.  Musculoskeletal: Normal range of motion.     Right lower leg: Edema present.     Left lower leg: Edema present.  Skin:    General: Skin is warm and dry.  Neurological:     Mental Status: He is alert and oriented to person, place, and time.  Psychiatric:        Judgment: Judgment normal.      ED Treatments / Results  Labs (all labs ordered are listed, but only abnormal results are displayed) Labs Reviewed  CBC - Abnormal; Notable for the following components:      Result Value   WBC 13.8 (*)    All other components within normal limits  COMPREHENSIVE METABOLIC PANEL - Abnormal; Notable for the following components:   Potassium 2.5 (*)    Chloride 88 (*)    Glucose, Bld 245 (*)    BUN 62 (*)    Creatinine, Ser 1.91 (*)    GFR calc non Af Amer 35 (*)    GFR calc Af Amer 41 (*)    Anion gap 16 (*)    All other components within normal limits  MAGNESIUM - Abnormal; Notable for the following components:   Magnesium 1.5 (*)    All other components within normal limits  PHOSPHORUS  TROPONIN I  BRAIN NATRIURETIC PEPTIDE   BUN  Date Value Ref Range Status   09/25/2018 62 (H) 8 - 23 mg/dL Final  09/13/2017 27 (H) 6 - 20 mg/dL Final  01/02/2017 27 (H) 6 - 20 mg/dL Final  11/01/2014 17 6 - 20 mg/dL Final   Creatinine, Ser  Date Value Ref Range Status  09/25/2018 1.91 (H) 0.61 - 1.24 mg/dL Final  09/13/2017 1.09 0.61 - 1.24 mg/dL Final  01/02/2017 1.17 0.61 - 1.24 mg/dL Final  11/01/2014 1.15 0.61 - 1.24 mg/dL Final       EKG EKG Interpretation  Date/Time:  Saturday September 25 2018 17:30:31 EDT Ventricular Rate:  92 PR Interval:    QRS Duration: 116 QT Interval:  443 QTC Calculation: 549 R Axis:   90 Text Interpretation:  Sinus rhythm Nonspecific intraventricular conduction delay Borderline repolarization abnormality Baseline wander in lead(s) V5 No significant change was found Confirmed by Jola Schmidt [phone]) on 09/25/2018 6:15:54 PM   Radiology Dg Chest 2 View  Result Date: 09/25/2018 CLINICAL DATA:  Chest pain and shortness of breath. EXAM: CHEST - 2 VIEW COMPARISON:  08/02/2018 FINDINGS: The cardiomediastinal contours are unchanged. Unchanged mild cardiomegaly. Aortic atherosclerosis. Pulmonary vasculature is normal. No consolidation, pleural effusion, or pneumothorax. No acute osseous abnormalities are seen. IMPRESSION: 1. No acute findings. 2. Stable mild cardiomegaly and Aortic Atherosclerosis (ICD10-I70.0). Electronically Signed   By: Keith Rake M.D.   On: 09/25/2018 19:28    Procedures .Critical Care Performed by: Jola Schmidt, MD Authorized by: Jola Schmidt, MD   Critical care provider statement:    Critical care time (minutes):  32   Critical care was time spent personally  by me on the following activities:  Discussions with consultants, evaluation of patient's response to treatment, examination of patient, ordering and performing treatments and interventions, ordering and review of laboratory studies, ordering and review of radiographic studies, pulse oximetry, re-evaluation of patient's condition, obtaining  history from patient or surrogate and review of old charts   (including critical care time)  Medications Ordered in ED Medications  potassium chloride 10 mEq in 100 mL IVPB (10 mEq Intravenous New Bag/Given 09/25/18 2002)  potassium chloride SA (K-DUR,KLOR-CON) CR tablet 40 mEq (40 mEq Oral Given 09/25/18 2002)     Initial Impression / Assessment and Plan / ED Course  I have reviewed the triage vital signs and the nursing notes.  Pertinent labs & imaging results that were available during my care of the patient were reviewed by me and considered in my medical decision making (see chart for details).       Profound hypokalemia in the setting of diuretics and home oral potassium.  New acute kidney injury as well.  He will need admission to the hospital for replacement of his potassium under close observation on telemetry.  Oral and IV potassium given here in the emergency department.  Patient and family updated.  All questions answered.  Case discussed with Dr. Myna Hidalgo, [facility] who accepts the patient in transfer.    Final Clinical Impressions(s) / ED Diagnoses   Final diagnoses:  None    ED Discharge Orders    None       Jola Schmidt, MD 09/25/18 2029

## 2018-09-25 NOTE — ED Notes (Signed)
Date and time results received: 09/25/18 1836   Test:potassium Critical Value: 2.5 Name of Provider Notified: Venora Maples  Orders Received? Or Actions Taken?: no orders given

## 2018-09-25 NOTE — ED Notes (Signed)
Attempted report 

## 2018-09-25 NOTE — ED Notes (Signed)
[facility] at bedside 

## 2018-09-25 NOTE — ED Notes (Signed)
Spoke with patient's wife and encouraged her to go home and if patient will be discharged, we will call her to pick up patient.

## 2018-09-26 ENCOUNTER — Encounter (HOSPITAL_COMMUNITY): Payer: Self-pay | Admitting: Family Medicine

## 2018-09-26 ENCOUNTER — Observation Stay (HOSPITAL_BASED_OUTPATIENT_CLINIC_OR_DEPARTMENT_OTHER): Payer: Medicare HMO

## 2018-09-26 DIAGNOSIS — R079 Chest pain, unspecified: Secondary | ICD-10-CM

## 2018-09-26 DIAGNOSIS — J45902 Unspecified asthma with status asthmaticus: Secondary | ICD-10-CM | POA: Diagnosis present

## 2018-09-26 DIAGNOSIS — E7800 Pure hypercholesterolemia, unspecified: Secondary | ICD-10-CM | POA: Diagnosis not present

## 2018-09-26 DIAGNOSIS — Z6839 Body mass index (BMI) 39.0-39.9, adult: Secondary | ICD-10-CM | POA: Diagnosis not present

## 2018-09-26 DIAGNOSIS — F418 Other specified anxiety disorders: Secondary | ICD-10-CM | POA: Diagnosis present

## 2018-09-26 DIAGNOSIS — H269 Unspecified cataract: Secondary | ICD-10-CM | POA: Diagnosis not present

## 2018-09-26 DIAGNOSIS — I493 Ventricular premature depolarization: Secondary | ICD-10-CM | POA: Diagnosis not present

## 2018-09-26 DIAGNOSIS — Z8614 Personal history of Methicillin resistant Staphylococcus aureus infection: Secondary | ICD-10-CM | POA: Diagnosis not present

## 2018-09-26 DIAGNOSIS — Z885 Allergy status to narcotic agent status: Secondary | ICD-10-CM | POA: Diagnosis not present

## 2018-09-26 DIAGNOSIS — N179 Acute kidney failure, unspecified: Secondary | ICD-10-CM | POA: Diagnosis present

## 2018-09-26 DIAGNOSIS — I2510 Atherosclerotic heart disease of native coronary artery without angina pectoris: Secondary | ICD-10-CM | POA: Diagnosis present

## 2018-09-26 DIAGNOSIS — I5033 Acute on chronic diastolic (congestive) heart failure: Secondary | ICD-10-CM | POA: Diagnosis present

## 2018-09-26 DIAGNOSIS — I5032 Chronic diastolic (congestive) heart failure: Secondary | ICD-10-CM | POA: Diagnosis present

## 2018-09-26 DIAGNOSIS — E119 Type 2 diabetes mellitus without complications: Secondary | ICD-10-CM | POA: Diagnosis not present

## 2018-09-26 DIAGNOSIS — J449 Chronic obstructive pulmonary disease, unspecified: Secondary | ICD-10-CM | POA: Diagnosis present

## 2018-09-26 DIAGNOSIS — Z9049 Acquired absence of other specified parts of digestive tract: Secondary | ICD-10-CM | POA: Diagnosis not present

## 2018-09-26 DIAGNOSIS — K219 Gastro-esophageal reflux disease without esophagitis: Secondary | ICD-10-CM | POA: Diagnosis not present

## 2018-09-26 DIAGNOSIS — G4730 Sleep apnea, unspecified: Secondary | ICD-10-CM | POA: Diagnosis not present

## 2018-09-26 DIAGNOSIS — E669 Obesity, unspecified: Secondary | ICD-10-CM | POA: Diagnosis present

## 2018-09-26 DIAGNOSIS — I110 Hypertensive heart disease with heart failure: Secondary | ICD-10-CM | POA: Diagnosis present

## 2018-09-26 DIAGNOSIS — Z981 Arthrodesis status: Secondary | ICD-10-CM | POA: Diagnosis not present

## 2018-09-26 DIAGNOSIS — E876 Hypokalemia: Secondary | ICD-10-CM | POA: Diagnosis present

## 2018-09-26 DIAGNOSIS — Z888 Allergy status to other drugs, medicaments and biological substances status: Secondary | ICD-10-CM | POA: Diagnosis not present

## 2018-09-26 DIAGNOSIS — Z794 Long term (current) use of insulin: Secondary | ICD-10-CM | POA: Diagnosis not present

## 2018-09-26 DIAGNOSIS — F319 Bipolar disorder, unspecified: Secondary | ICD-10-CM | POA: Diagnosis not present

## 2018-09-26 DIAGNOSIS — G4733 Obstructive sleep apnea (adult) (pediatric): Secondary | ICD-10-CM | POA: Diagnosis not present

## 2018-09-26 LAB — URINALYSIS, ROUTINE W REFLEX MICROSCOPIC
Bacteria, UA: NONE SEEN
Bilirubin Urine: NEGATIVE
Glucose, UA: >=500 mg/dL — AB
Hgb urine dipstick: NEGATIVE
Ketones, ur: NEGATIVE mg/dL
Leukocytes,Ua: NEGATIVE
Nitrite: NEGATIVE
Protein, ur: NEGATIVE mg/dL
Specific Gravity, Urine: 1.007 (ref 1.005–1.030)
pH: 7 (ref 5.0–8.0)

## 2018-09-26 LAB — CBC WITH DIFFERENTIAL/PLATELET
Abs Immature Granulocytes: 0.08 10*3/uL — ABNORMAL HIGH (ref 0.00–0.07)
Basophils Absolute: 0.1 10*3/uL (ref 0.0–0.1)
Basophils Relative: 1 %
Eosinophils Absolute: 0.4 10*3/uL (ref 0.0–0.5)
Eosinophils Relative: 3 %
HCT: 36.1 % — ABNORMAL LOW (ref 39.0–52.0)
Hemoglobin: 12.6 g/dL — ABNORMAL LOW (ref 13.0–17.0)
Immature Granulocytes: 1 %
Lymphocytes Relative: 19 %
Lymphs Abs: 2.6 10*3/uL (ref 0.7–4.0)
MCH: 29.6 pg (ref 26.0–34.0)
MCHC: 34.9 g/dL (ref 30.0–36.0)
MCV: 84.9 fL (ref 80.0–100.0)
Monocytes Absolute: 1 10*3/uL (ref 0.1–1.0)
Monocytes Relative: 8 %
Neutro Abs: 9 10*3/uL — ABNORMAL HIGH (ref 1.7–7.7)
Neutrophils Relative %: 68 %
Platelets: 285 10*3/uL (ref 150–400)
RBC: 4.25 MIL/uL (ref 4.22–5.81)
RDW: 13.1 % (ref 11.5–15.5)
WBC: 13.2 10*3/uL — ABNORMAL HIGH (ref 4.0–10.5)
nRBC: 0 % (ref 0.0–0.2)

## 2018-09-26 LAB — BASIC METABOLIC PANEL
Anion gap: 15 (ref 5–15)
Anion gap: 15 (ref 5–15)
BUN: 53 mg/dL — ABNORMAL HIGH (ref 8–23)
BUN: 46 mg/dL — ABNORMAL HIGH (ref 8–23)
CO2: 29 mmol/L (ref 22–32)
CO2: 29 mmol/L (ref 22–32)
Calcium: 9 mg/dL (ref 8.9–10.3)
Calcium: 9.2 mg/dL (ref 8.9–10.3)
Chloride: 90 mmol/L — ABNORMAL LOW (ref 98–111)
Chloride: 89 mmol/L — ABNORMAL LOW (ref 98–111)
Creatinine, Ser: 1.86 mg/dL — ABNORMAL HIGH (ref 0.61–1.24)
Creatinine, Ser: 1.72 mg/dL — ABNORMAL HIGH (ref 0.61–1.24)
GFR calc Af Amer: 42 mL/min — ABNORMAL LOW (ref >60)
GFR calc Af Amer: 46 mL/min — ABNORMAL LOW (ref >60)
GFR calc non Af Amer: 36 mL/min — ABNORMAL LOW (ref >60)
GFR calc non Af Amer: 40 mL/min — ABNORMAL LOW (ref >60)
Glucose, Bld: 228 mg/dL — ABNORMAL HIGH (ref 70–99)
Glucose, Bld: 307 mg/dL — ABNORMAL HIGH (ref 70–99)
Potassium: 2.3 mmol/L — CL (ref 3.5–5.1)
Potassium: 2.8 mmol/L — ABNORMAL LOW (ref 3.5–5.1)
Sodium: 134 mmol/L — ABNORMAL LOW (ref 135–145)
Sodium: 133 mmol/L — ABNORMAL LOW (ref 135–145)

## 2018-09-26 LAB — ECHOCARDIOGRAM LIMITED
Height: 71 in
Weight: 4528 oz

## 2018-09-26 LAB — MAGNESIUM: Magnesium: 1.9 mg/dL (ref 1.7–2.4)

## 2018-09-26 LAB — URINALYSIS, COMPLETE (UACMP) WITH MICROSCOPIC
Bacteria, UA: NONE SEEN
Bilirubin Urine: NEGATIVE
Glucose, UA: NEGATIVE mg/dL
Ketones, ur: NEGATIVE mg/dL
Leukocytes,Ua: NEGATIVE
Nitrite: NEGATIVE
Protein, ur: NEGATIVE mg/dL
Specific Gravity, Urine: 1.009 (ref 1.005–1.030)
pH: 6 (ref 5.0–8.0)

## 2018-09-26 LAB — GLUCOSE, CAPILLARY
Glucose-Capillary: 167 mg/dL — ABNORMAL HIGH (ref 70–99)
Glucose-Capillary: 228 mg/dL — ABNORMAL HIGH (ref 70–99)
Glucose-Capillary: 310 mg/dL — ABNORMAL HIGH (ref 70–99)
Glucose-Capillary: 261 mg/dL — ABNORMAL HIGH (ref 70–99)
Glucose-Capillary: 314 mg/dL — ABNORMAL HIGH (ref 70–99)

## 2018-09-26 LAB — CREATININE, URINE, RANDOM: Creatinine, Urine: 52.52 mg/dL

## 2018-09-26 LAB — HIV ANTIBODY (ROUTINE TESTING W REFLEX): HIV Screen 4th Generation wRfx: NONREACTIVE

## 2018-09-26 LAB — SODIUM, URINE, RANDOM: Sodium, Ur: 48 mmol/L

## 2018-09-26 MED ORDER — SODIUM CHLORIDE 0.9 % IV SOLN: 250.0000 mL | INTRAVENOUS | Status: DC | PRN

## 2018-09-26 MED ORDER — POLYETHYLENE GLYCOL 3350 17 G PO PACK
17.0000 g | PACK | Freq: Every day | ORAL | Status: DC | PRN
  Administered 2018-09-27 – 2018-09-28 (×2): 17 g via ORAL
  Filled 2018-09-26 (×2): qty 1

## 2018-09-26 MED ORDER — SODIUM CHLORIDE 0.9% FLUSH
3.0000 mL | Freq: Two times a day (BID) | INTRAVENOUS | Status: DC
  Administered 2018-09-26 – 2018-09-28 (×3): 3 mL via INTRAVENOUS

## 2018-09-26 MED ORDER — MOMETASONE FURO-FORMOTEROL FUM 200-5 MCG/ACT IN AERO
2.0000 | INHALATION_SPRAY | Freq: Two times a day (BID) | RESPIRATORY_TRACT | Status: DC
  Administered 2018-09-26 – 2018-09-28 (×4): 2 via RESPIRATORY_TRACT
  Filled 2018-09-26: qty 8.8

## 2018-09-26 MED ORDER — SODIUM CHLORIDE 0.9 % IV SOLN
INTRAVENOUS | Status: DC
  Administered 2018-09-26: 02:00:00 via INTRAVENOUS

## 2018-09-26 MED ORDER — INSULIN GLARGINE 100 UNIT/ML ~~LOC~~ SOLN
20.0000 [IU] | Freq: Two times a day (BID) | SUBCUTANEOUS | Status: DC
  Administered 2018-09-26 (×3): 20 [IU] via SUBCUTANEOUS
  Filled 2018-09-26 (×6): qty 0.2

## 2018-09-26 MED ORDER — SERTRALINE HCL 50 MG PO TABS
50.0000 mg | ORAL_TABLET | ORAL | Status: DC
  Administered 2018-09-27 – 2018-09-28 (×2): 50 mg via ORAL
  Filled 2018-09-26 (×2): qty 1

## 2018-09-26 MED ORDER — MAGNESIUM SULFATE 2 GM/50ML IV SOLN
2.0000 g | Freq: Once | INTRAVENOUS | Status: AC
  Administered 2018-09-26: 2 g via INTRAVENOUS
  Filled 2018-09-26: qty 50

## 2018-09-26 MED ORDER — INSULIN ASPART 100 UNIT/ML ~~LOC~~ SOLN
0.0000 [IU] | Freq: Three times a day (TID) | SUBCUTANEOUS | Status: DC
  Administered 2018-09-26: 3 [IU] via SUBCUTANEOUS
  Administered 2018-09-26: 7 [IU] via SUBCUTANEOUS
  Administered 2018-09-26 – 2018-09-27 (×2): 5 [IU] via SUBCUTANEOUS
  Administered 2018-09-27: 9 [IU] via SUBCUTANEOUS
  Administered 2018-09-27: 17:00:00 6 [IU] via SUBCUTANEOUS
  Administered 2018-09-28: 3 [IU] via SUBCUTANEOUS
  Administered 2018-09-28: 12:00:00 5 [IU] via SUBCUTANEOUS

## 2018-09-26 MED ORDER — ACETAMINOPHEN 325 MG PO TABS: 650.0000 mg | ORAL_TABLET | Freq: Four times a day (QID) | ORAL | Status: DC | PRN

## 2018-09-26 MED ORDER — MAGNESIUM OXIDE 400 (241.3 MG) MG PO TABS
400.0000 mg | ORAL_TABLET | Freq: Once | ORAL | Status: AC
  Administered 2018-09-26: 400 mg via ORAL
  Filled 2018-09-26: qty 1

## 2018-09-26 MED ORDER — SODIUM CHLORIDE 0.9% FLUSH
3.0000 mL | Freq: Two times a day (BID) | INTRAVENOUS | Status: DC
  Administered 2018-09-26 – 2018-09-27 (×3): 3 mL via INTRAVENOUS

## 2018-09-26 MED ORDER — PERFLUTREN LIPID MICROSPHERE
1.0000 mL | INTRAVENOUS | Status: AC | PRN
  Administered 2018-09-26: 2 mL via INTRAVENOUS
  Filled 2018-09-26: qty 10

## 2018-09-26 MED ORDER — CARVEDILOL 12.5 MG PO TABS
12.5000 mg | ORAL_TABLET | Freq: Two times a day (BID) | ORAL | Status: DC
  Administered 2018-09-26 – 2018-09-28 (×4): 12.5 mg via ORAL
  Filled 2018-09-26 (×4): qty 1

## 2018-09-26 MED ORDER — ONDANSETRON HCL 4 MG/2ML IJ SOLN: 4.0000 mg | Freq: Four times a day (QID) | INTRAMUSCULAR | Status: DC | PRN

## 2018-09-26 MED ORDER — ACETAMINOPHEN 650 MG RE SUPP: 650.0000 mg | Freq: Four times a day (QID) | RECTAL | Status: DC | PRN

## 2018-09-26 MED ORDER — HYDROCODONE-ACETAMINOPHEN 5-325 MG PO TABS
1.0000 | ORAL_TABLET | ORAL | Status: DC | PRN
  Administered 2018-09-26 – 2018-09-28 (×7): 2 via ORAL
  Filled 2018-09-26 (×7): qty 2

## 2018-09-26 MED ORDER — HEPARIN SODIUM (PORCINE) 5000 UNIT/ML IJ SOLN
5000.0000 [IU] | Freq: Three times a day (TID) | INTRAMUSCULAR | Status: DC
  Administered 2018-09-26 – 2018-09-28 (×5): 5000 [IU] via SUBCUTANEOUS
  Filled 2018-09-26 (×5): qty 1

## 2018-09-26 MED ORDER — SODIUM CHLORIDE 0.9% FLUSH: 3.0000 mL | INTRAVENOUS | Status: DC | PRN

## 2018-09-26 MED ORDER — ALBUTEROL SULFATE (2.5 MG/3ML) 0.083% IN NEBU: 2.5000 mg | INHALATION_SOLUTION | RESPIRATORY_TRACT | Status: DC | PRN

## 2018-09-26 MED ORDER — ATORVASTATIN CALCIUM 40 MG PO TABS
40.0000 mg | ORAL_TABLET | Freq: Every evening | ORAL | Status: DC
  Administered 2018-09-26 – 2018-09-27 (×2): 40 mg via ORAL
  Filled 2018-09-26 (×2): qty 1

## 2018-09-26 MED ORDER — INSULIN ASPART 100 UNIT/ML ~~LOC~~ SOLN
0.0000 [IU] | Freq: Every day | SUBCUTANEOUS | Status: DC
  Administered 2018-09-26 – 2018-09-27 (×2): 4 [IU] via SUBCUTANEOUS

## 2018-09-26 MED ORDER — POTASSIUM CHLORIDE CRYS ER 20 MEQ PO TBCR
40.0000 meq | EXTENDED_RELEASE_TABLET | Freq: Three times a day (TID) | ORAL | Status: DC
  Administered 2018-09-26 – 2018-09-27 (×4): 40 meq via ORAL
  Filled 2018-09-26 (×4): qty 2

## 2018-09-26 MED ORDER — ONDANSETRON HCL 4 MG PO TABS: 4.0000 mg | ORAL_TABLET | Freq: Four times a day (QID) | ORAL | Status: DC | PRN

## 2018-09-26 MED ORDER — PANTOPRAZOLE SODIUM 40 MG PO TBEC
80.0000 mg | DELAYED_RELEASE_TABLET | Freq: Every day | ORAL | Status: DC
  Administered 2018-09-26 – 2018-09-28 (×3): 80 mg via ORAL
  Filled 2018-09-26 (×3): qty 2

## 2018-09-26 NOTE — Progress Notes (Signed)
Patient arrived via [facility] to [facility]. Patient alert and oriented x4. RN updated on plan of care. CCMD notified. MD notified of patient's arrival to floor. No other complaints at this time. Will continue to monitor patient.

## 2018-09-26 NOTE — Progress Notes (Signed)
Placed patient on CPAP for the night via auto-flow with minimum pressure set at 8cm and maximum pressure set at 24cm.

## 2018-09-26 NOTE — Progress Notes (Signed)
Pharmacy notified that pt's admit med rec need to be completed. Rx tech states "someone will get to it"

## 2018-09-26 NOTE — H&P (Signed)
History and Physical    Mike Hunter [id number]:[id number] DOB: [birth year] DOA: 09/25/2018  PCP: Lilian Coma., MD   Patient coming from: Home   Chief Complaint: Abnormal outpatient labs   HPI: Mike Hunter is a 68 y.o. male with medical history significant for bipolar disorder, insulin-dependent diabetes mellitus, obesity (BMI 40), chronic diastolic CHF, OSA on CPAP, and minimal CAD on cath per the report of patient, now presenting to the emergency department for evaluation of abnormal blood work on his outpatient labs.  Patient reports several months of waxing and waning dyspnea, cough, and occasional chest pains.  He has been following closely with his outpatient physicians for this, was instructed to double his diuretic on 09/01/2018, was found to have a critically low potassium on outpatient blood work, and directed to the ED for evaluation of this.  Patient reports occasional, fleeting pains in the central chest, sometimes involving the left arm over the past several months.  He reports history of cardiac cath with minimal CAD per his report.  States that his weight has been fluctuating.  He reports continued adherence with his diuretic, magnesium oxide, and supplemental potassium.  Denies any fevers, chills, chest pain at this time, travel, or sick contacts.  ED Course: Upon arrival to the ED, patient is found to be afebrile, saturating mid 90s on room air, heart rate 52-91, and stable blood pressure.  EKG features a sinus rhythm with PVC and mild repolarization abnormality.  Chest x-ray is negative for acute cardiopulmonary disease.  Troponin is undetectable and BNP normal.  Chemistry panel is notable for potassium of 2.5, magnesium 1.5, glucose 245, BUN 62, and creatinine 1.91, up from 1.1 two weeks ago.  CBC features a leukocytosis to 13,800.  Patient was given oral and IV potassium in the ED, remained hemodynamically stable, and has been transferred to [facility] where he will be observed for further  evaluation and management.  Review of Systems:  All other systems reviewed and apart from HPI, are negative.  Past Medical History:  Diagnosis Date  . Asthma   . Bipolar affective ([place])   . Cataract   . CHF (congestive heart failure) ([place])   . Conversion disorder   . Cornea disorder   . Diabetes mellitus   . 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 High cholesterol   . History of methicillin resistant staphylococcus aureus (MRSA)   . Hypertension   . Reflux   . Sleep apnea     Past Surgical History:  Procedure Laterality Date  . CERVICAL FUSION    . CHOLECYSTECTOMY    . KIDNEY STONE SURGERY    . KNEE ARTHROSCOPY       reports that he has quit smoking. He has never used smokeless tobacco. He reports that he does not drink alcohol or use drugs.  Allergies  Allergen Reactions  . Codeine Nausea And Vomiting  . Morphine And Related Nausea And Vomiting    History reviewed. No pertinent family history.   Prior to Admission medications   Medication Sig Start Date End Date Taking? Authorizing Provider  albuterol (PROVENTIL HFA;VENTOLIN HFA) 108 (90 BASE) MCG/ACT inhaler Inhale 2 puffs into the lungs every 4 (four) hours as needed for wheezing.    [provider]  atorvastatin (LIPITOR) 40 MG tablet Take 40 mg by mouth daily.    [provider]  Canagliflozin (INVOKANA) 100 MG TABS Take 100 mg by mouth daily.    [provider]  carvedilol (COREG) 25 MG tablet Take 25 mg by mouth 2 (two) times daily with a meal.    [provider]  donepezil (ARICEPT) 10 MG tablet Take 10 mg by mouth at bedtime.    [provider]  ferrous sulfate 325 (65 FE) MG tablet Take 325 mg by mouth daily with breakfast.    [provider]  furosemide (LASIX) 20 MG tablet Take 40 mg by mouth daily.     [provider]  insulin aspart (NOVOLOG FLEXPEN) 100 UNIT/ML injection Inject 5 Units into the skin 3 (three) times daily before meals.  5 units prior to meals for cbg>= 150    [provider]  Insulin Degludec (TRESIBA FLEXTOUCH [place]) Inject 65 Units into the skin 2 (two) times daily.    [provider]  insulin glargine (LANTUS SOLOSTAR) 100 UNIT/ML injection Inject 65 Units into the skin 2 (two) times daily. Give 65 units in the and 60 units in th pm    [provider]  L-methylfolate Calcium 7.5 MG TABS Take 1 tablet by mouth daily.     [provider]  lansoprazole (PREVACID) 30 MG capsule Take 30 mg by mouth 2 (two) times daily.      [provider]  latanoprost (XALATAN) 0.005 % ophthalmic solution Place 1 drop into both eyes at bedtime.      [provider]  losartan (COZAAR) 25 MG tablet Take 25 mg by mouth daily.    [provider]  memantine (NAMENDA) 5 MG tablet Take 5 mg by mouth 2 (two) times daily.    [provider]  metFORMIN (GLUCOPHAGE) 1000 MG tablet Take 1,000 mg by mouth 2 (two) times daily with a meal.      [provider]  montelukast (SINGULAIR) 10 MG tablet Take 10 mg by mouth at bedtime.    [provider]  naproxen (NAPROSYN) 500 MG tablet Take 500 mg by mouth as needed.    [provider]  omeprazole (PRILOSEC) 40 MG capsule Take 40 mg by mouth daily.    [provider]  OxyCODONE (OXYCONTIN) 15 mg T12A Take 1 tablet (15 mg total) by mouth every 12 (twelve) hours. 11/11/12   Reed, Tiffany L, DO  oxyCODONE-acetaminophen (PERCOCET/ROXICET) 5-325 MG per tablet Take 1-2 tablets by mouth every 6 (six) hours as needed. Take one to two tablets every four hours as needed for pain. 10/28/12   Reed, Tiffany L, DO  predniSONE (DELTASONE) 20 MG tablet Take 60 mg daily x 2 days then 40 mg daily x 2 day then 20 mg daily x 2 days 11/01/14   Drenda Freeze, MD  sertraline (ZOLOFT) 50 MG tablet Take 50 mg by mouth daily.    [provider]  topiramate (TOPAMAX) 50 MG tablet Take 50 mg by mouth at bedtime.     [provider]  traMADol (ULTRAM) 50 MG tablet Take 1 tablet (50 mg total) by mouth every 6 (six) hours as needed. 01/02/17   Tanna Furry, MD  trolamine salicylate (ASPERCREME) 10 % cream Apply 1 application topically as needed for muscle pain.    [provider]    Physical Exam: Vitals:   09/25/18 2223 09/25/18 2300 09/26/18 0035 09/26/18 0040  BP:  127/74 (!) 131/57   Pulse:   91   Resp:  20 20   Temp: 98.5 F (36.9 C)  98.6 F (37 C)   TempSrc: Oral  Oral   SpO2:   Marland Kitchen)  89%   Weight:    128.4 kg  Height:        Constitutional: NAD, calm  Eyes: PERTLA, lids and conjunctivae normal ENMT: Mucous membranes are moist. Posterior pharynx clear of any exudate or lesions.   Neck: normal, supple, no masses, no thyromegaly Respiratory: clear to auscultation bilaterally, no wheezing, no crackles. No accessory muscle use.  Cardiovascular: S1 & S2 heard, regular rate and rhythm. No extremity edema.   Abdomen: Soft, no tenderness. Bowel sounds normal.  Musculoskeletal: no clubbing / cyanosis. No joint deformity upper and lower extremities.    Skin: no significant rashes, lesions, ulcers. Warm, dry, well-perfused. Neurologic: no gross facial asymmetry. Sensation intact. Moving all extremities.  Psychiatric: Alert and oriented x 3. Calm, cooperative.    Labs on Admission: I have personally reviewed following labs and imaging studies  CBC: Recent Labs  Lab 09/25/18 1743  WBC 13.8*  HGB 13.6  HCT 40.8  MCV 87.2  PLT 765   Basic Metabolic Panel: Recent Labs  Lab 09/25/18 1743  NA 135  K 2.5*  CL 88*  CO2 31  GLUCOSE 245*  BUN 62*  CREATININE 1.91*  CALCIUM 9.4  MG 1.5*  PHOS 3.8   GFR: Estimated Creatinine Clearance: 49.8 mL/min (A) (by C-G formula based on SCr of 1.91 mg/dL (H)). Liver Function Tests: Recent Labs  Lab 09/25/18 1743  AST 18  ALT 18  ALKPHOS 84  BILITOT 0.6  PROT 8.0  ALBUMIN 3.9   No results for input(s): LIPASE, AMYLASE in  the last 168 hours. No results for input(s): AMMONIA in the last 168 hours. Coagulation Profile: No results for input(s): INR, PROTIME in the last 168 hours. Cardiac Enzymes: Recent Labs  Lab 09/25/18 1743  TROPONINI <0.03   BNP (last 3 results) No results for input(s): PROBNP in the last 8760 hours. HbA1C: No results for input(s): HGBA1C in the last 72 hours. CBG: No results for input(s): GLUCAP in the last 168 hours. Lipid Profile: No results for input(s): CHOL, HDL, LDLCALC, TRIG, CHOLHDL, LDLDIRECT in the last 72 hours. Thyroid Function Tests: No results for input(s): TSH, T4TOTAL, FREET4, T3FREE, THYROIDAB in the last 72 hours. Anemia Panel: No results for input(s): VITAMINB12, FOLATE, FERRITIN, TIBC, IRON, RETICCTPCT in the last 72 hours. Urine analysis:    Component Value Date/Time   COLORURINE YELLOW 01/02/2017 1200   APPEARANCEUR CLEAR 01/02/2017 1200   LABSPEC 1.010 01/02/2017 1200   PHURINE 5.0 01/02/2017 1200   GLUCOSEU NEGATIVE 01/02/2017 1200   HGBUR NEGATIVE 01/02/2017 1200   BILIRUBINUR NEGATIVE 01/02/2017 1200   KETONESUR NEGATIVE 01/02/2017 1200   PROTEINUR NEGATIVE 01/02/2017 1200   UROBILINOGEN 0.2 11/01/2014 1530   NITRITE NEGATIVE 01/02/2017 1200   LEUKOCYTESUR NEGATIVE 01/02/2017 1200   Sepsis Labs: @LABRCNTIP (procalcitonin:4,lacticidven:4) )No results found for this or any previous visit (from the past 240 hour(s)).   Radiological Exams on Admission: Dg Chest 2 View  Result Date: 09/25/2018 CLINICAL DATA:  Chest pain and shortness of breath. EXAM: CHEST - 2 VIEW COMPARISON:  08/02/2018 FINDINGS: The cardiomediastinal contours are unchanged. Unchanged mild cardiomegaly. Aortic atherosclerosis. Pulmonary vasculature is normal. No consolidation, pleural effusion, or pneumothorax. No acute osseous abnormalities are seen. IMPRESSION: 1. No acute findings. 2. Stable mild cardiomegaly and Aortic Atherosclerosis (ICD10-I70.0). Electronically Signed   By:  Keith Rake M.D.   On: 09/25/2018 19:28    EKG: Independently reviewed. Sinus rhythm, PVC, non-specific IVCD with mild repolarization abnormality.   Assessment/Plan  1. Acute kidney injury  -  Presents with AKI and hypokalemia on outpatient blood work, likely secondary to recent increase in diuretics  - His wt is down and there is no leg swelling, BNP elevation, or CHF findings on CXR  - Check FEUrea, hold diuretics and ARB, continue a gentle IVF hydration overnight, renally-dose medications, and repeat chem panel in am    2. Hypokalemia; hypomagnesemia  - Serum potassium is 2.5 and magnesium 1.5 in ED  - Treated with oral and IV potassium in ED and given oral and IV mag on arrival to [facility]  - Continue cardiac monitoring, repeat chemistries in am    3. Chronic diastolic CHF  - No leg swelling or rales on exam, BNP is low and no edema or congestion noted on CXR, wt down ~6 lbs from office visit ~3 wks ago - Patient reports being scheduled for echo on May 6th in [place]  - Gentle IVF hydration while holding diuretics and ARB as above - Follow daily wt and I/O's    4. CAD  - No chest pain on admission though reports occasional central chest discomfort over the past few months  - Patient reports history of cath x3 with "minimal" disease per patient and no PCI  - Continue antiplatelets, statin, beta-blocker pending pharmacy med-rec    5. Bipolar disorder  - Stable  - Pharmacy med-rec pending    6. Insulin-dependent DM  - A1c was 6.9% in February 2020  - Med-rec pending, will check CBG's and use a reduced-dose Lantus with sliding-scale Novolog for now   7. Asthma  - No cough or wheezing on admission  - Continue albuterol MDI    PPE: Mask, face shield  DVT prophylaxis: sq heparin  Code Status: Full  Family Communication: Discussed with patient Consults called: None Admission status: Observation     Vianne Bulls, MD [facility] Pager [phone]  If  7PM-7AM, please contact night-coverage [email or web address] Password [email or web address]  09/26/2018, 1:11 AM

## 2018-09-26 NOTE — Progress Notes (Signed)
CRITICAL VALUE ALERT  Critical Value:  Potassium 2.3  Date & Time Notied:  09/26/18 2508  Provider Notified: Arby Barrette, NP  Orders Received/Actions taken: Awaiting new orders.

## 2018-09-26 NOTE — Plan of Care (Signed)
  Problem: Education: Goal: Ability to demonstrate management of disease process will improve Outcome: Progressing   Problem: Activity: Goal: Capacity to carry out activities will improve Outcome: Progressing   Problem: Cardiac: Goal: Ability to achieve and maintain adequate cardiopulmonary perfusion will improve Outcome: Progressing   Problem: Activity: Goal: Risk for activity intolerance will decrease Outcome: Progressing   Problem: Pain Managment: Goal: General experience of comfort will improve Outcome: Progressing   Problem: Safety: Goal: Ability to remain free from injury will improve Outcome: Progressing

## 2018-09-26 NOTE — Progress Notes (Signed)
  Echocardiogram 2D Echocardiogram has been performed.  Mike Hunter 09/26/2018, 1:55 PM

## 2018-09-26 NOTE — Progress Notes (Addendum)
Incorrect input regarding cpap. Pt. States that he will wear  cpap tonight.

## 2018-09-26 NOTE — Progress Notes (Addendum)
Progress Note    FOUNT BAHE  VXY:[id number] DOB: December 24, 1950  DOA: 09/25/2018 PCP: Lilian Coma., MD    Brief Narrative:     Medical records reviewed and are as summarized below:  Mike Hunter is an 68 y.o. male  with medical history significant for bipolar disorder, insulin-dependent diabetes mellitus, obesity (BMI 40), chronic diastolic CHF, OSA on CPAP, and minimal CAD on cath per the report of patient, now presenting to the emergency department for evaluation of abnormal blood work on his outpatient labs.   Assessment/Plan:   Principal Problem:   AKI (acute kidney injury) ([place]) Active Problems:   Insulin-requiring or dependent type II diabetes mellitus ([facility])   Hypokalemia   Hypomagnesemia   Chronic diastolic CHF (congestive heart failure) ([facility])   CAD (coronary artery disease)   Asthma   Depression with anxiety   Asthma with status asthmaticus  Acute kidney injury  - Presents with AKI and hypokalemia on outpatient blood work, likely secondary to recent increase in diuretics  - His wt is down and there is no leg swelling, BNP elevation, or CHF findings on CXR  - hold diuretics and ARB -recheck in AM  Hypokalemia; hypomagnesemia  - Serum potassium is 2.5 and magnesium 1.5 in ED  - replete and recheck  Chronic diastolic CHF  - No leg swelling or rales on exam, BNP is low and no edema or congestion noted on CXR, wt down ~6 lbs from office visit ~3 wks ago - Patient reports being scheduled for echo on May 6th in [facility] - will order here - Follow daily wt and I/O's    CAD  - No chest pain on admission though reports occasional central chest discomfort over the past few months  - Patient reports history of cath x3 with "minimal" disease per patient and no PCI  - Continue antiplatelets, statin, beta-blocker   Bipolar disorder  - Stable    Insulin-dependent DM  - A1c was 6.9% in February 2020  - lantus and SSI for now-- titrate meds as needed to home  dose   Asthma  - Continue albuterol MDI   obesity Body mass index is 39.47 kg/m.  OSA -wears CPAP  Says he has an upcoming appointment with Pulm for further work up  Family Communication/Anticipated D/C date and plan/Code Status   DVT prophylaxis: heparin Code Status: Full Code.  Family Communication:  Disposition Plan: pending work up   Psychiatric nurse:    None.   Anti-Infectives:    None  Subjective:   Multiple complaints- chest pain, SOB, "kidney stone"  Objective:    Vitals:   09/26/18 0040 09/26/18 0314 09/26/18 0447 09/26/18 0522  BP:   138/70   Pulse:  85 90 84  Resp:   18 18  Temp:   (!) 97.5 F (36.4 C)   TempSrc:   Oral   SpO2:  95% 94% 95%  Weight: 128.4 kg     Height:        Intake/Output Summary (Last 24 hours) at 09/26/2018 1026 Last data filed at 09/26/2018 8270 Gross per 24 hour  Intake 467.84 ml  Output 1100 ml  Net -632.16 ml   Filed Weights   09/25/18 1724 09/26/18 0040  Weight: 127 kg 128.4 kg    Exam: In bed, NAD Obese male Not on O2, no increased work of breathing Min LE edema Large abdomen Alert and oriented  Data Reviewed:   I have personally reviewed following labs  and imaging studies:  Labs: Labs show the following:   Basic Metabolic Panel: Recent Labs  Lab 09/25/18 1743 09/26/18 0455  NA 135 134*  K 2.5* 2.3*  CL 88* 90*  CO2 31 29  GLUCOSE 245* 228*  BUN 62* 53*  CREATININE 1.91* 1.86*  CALCIUM 9.4 9.0  MG 1.5* 1.9  PHOS 3.8  --    GFR Estimated Creatinine Clearance: 51.2 mL/min (A) (by C-G formula based on SCr of 1.86 mg/dL (H)). Liver Function Tests: Recent Labs  Lab 09/25/18 1743  AST 18  ALT 18  ALKPHOS 84  BILITOT 0.6  PROT 8.0  ALBUMIN 3.9   No results for input(s): LIPASE, AMYLASE in the last 168 hours. No results for input(s): AMMONIA in the last 168 hours. Coagulation profile No results for input(s): INR, PROTIME in the last 168 hours.  CBC: Recent Labs  Lab  09/25/18 1743 09/26/18 0455  WBC 13.8* 13.2*  NEUTROABS  --  9.0*  HGB 13.6 12.6*  HCT 40.8 36.1*  MCV 87.2 84.9  PLT 332 285   Cardiac Enzymes: Recent Labs  Lab 09/25/18 1743  TROPONINI <0.03   BNP (last 3 results) No results for input(s): PROBNP in the last 8760 hours. CBG: Recent Labs  Lab 09/26/18 0115 09/26/18 0559  GLUCAP 167* 228*   D-Dimer: No results for input(s): DDIMER in the last 72 hours. Hgb A1c: No results for input(s): HGBA1C in the last 72 hours. Lipid Profile: No results for input(s): CHOL, HDL, LDLCALC, TRIG, CHOLHDL, LDLDIRECT in the last 72 hours. Thyroid function studies: No results for input(s): TSH, T4TOTAL, T3FREE, THYROIDAB in the last 72 hours.  Invalid input(s): FREET3 Anemia work up: No results for input(s): VITAMINB12, FOLATE, FERRITIN, TIBC, IRON, RETICCTPCT in the last 72 hours. Sepsis Labs: Recent Labs  Lab 09/25/18 1743 09/26/18 0455  WBC 13.8* 13.2*    Microbiology No results found for this or any previous visit (from the past 240 hour(s)).  Procedures and diagnostic studies:  Dg Chest 2 View  Result Date: 09/25/2018 CLINICAL DATA:  Chest pain and shortness of breath. EXAM: CHEST - 2 VIEW COMPARISON:  08/02/2018 FINDINGS: The cardiomediastinal contours are unchanged. Unchanged mild cardiomegaly. Aortic atherosclerosis. Pulmonary vasculature is normal. No consolidation, pleural effusion, or pneumothorax. No acute osseous abnormalities are seen. IMPRESSION: 1. No acute findings. 2. Stable mild cardiomegaly and Aortic Atherosclerosis (ICD10-I70.0). Electronically Signed   By: Keith Rake M.D.   On: 09/25/2018 19:28    Medications:   . heparin  5,000 Units Subcutaneous Q8H  . insulin aspart  0-5 Units Subcutaneous QHS  . insulin aspart  0-9 Units Subcutaneous TID WC  . insulin glargine  20 Units Subcutaneous BID  . mometasone-formoterol  2 puff Inhalation BID  . pantoprazole  80 mg Oral Q1200  . potassium chloride  40  mEq Oral TID  . sodium chloride flush  3 mL Intravenous Q12H  . sodium chloride flush  3 mL Intravenous Q12H   Continuous Infusions: . sodium chloride       LOS: 0 days   Geradine Girt  [facility]   How to contact the [facility] Attending or Consulting provider [place] or covering provider during after hours [place], for this patient?  1. Check the care team in [facility] and look for a) attending/consulting [facility] provider listed and b) the [facility] team listed 2. Log into [email or web address] and use [place]'s universal password to access. If you do not have the password, please  contact the hospital operator. 3. Locate the [facility] provider you are looking for under [facility] and page to a number that you can be directly reached. 4. If you still have difficulty reaching the provider, please page the [facility] (Director on Call) for the Hospitalists listed on amion for assistance.  09/26/2018, 10:26 AM

## 2018-09-26 NOTE — Progress Notes (Signed)
Pt reports pain with urination. States he thinks he has a kidney stone. Pt reports he usually takes ibuprofen for his pain.  Notified MD via [place].  Pt also states that he spends most of the day on his CPAP machine. Pt reports generalized pain overall that is normal for him. Spoke with MD regarding home meds. Will order home meds.

## 2018-09-26 NOTE — Progress Notes (Signed)
Pt states that he is not suicidal but is not sure what he is going to do with his health. Pt states he does not want to talk to a chaplain. Pt offered palliative nurse and pt states "but i'm not suicidal". Pt educated on palliative nurse role. Pt states "whatever you want to do is fine". Pt states he has "mental problems". Pt educated that RN will notify MD.

## 2018-09-27 LAB — CBC
HCT: 37.4 % — ABNORMAL LOW (ref 39.0–52.0)
Hemoglobin: 12.3 g/dL — ABNORMAL LOW (ref 13.0–17.0)
MCH: 28.3 pg (ref 26.0–34.0)
MCHC: 32.9 g/dL (ref 30.0–36.0)
MCV: 86 fL (ref 80.0–100.0)
Platelets: 278 10*3/uL (ref 150–400)
RBC: 4.35 MIL/uL (ref 4.22–5.81)
RDW: 12.8 % (ref 11.5–15.5)
WBC: 10.5 10*3/uL (ref 4.0–10.5)
nRBC: 0 % (ref 0.0–0.2)

## 2018-09-27 LAB — BASIC METABOLIC PANEL
Anion gap: 15 (ref 5–15)
BUN: 36 mg/dL — ABNORMAL HIGH (ref 8–23)
CO2: 29 mmol/L (ref 22–32)
Calcium: 9.4 mg/dL (ref 8.9–10.3)
Chloride: 94 mmol/L — ABNORMAL LOW (ref 98–111)
Creatinine, Ser: 1.63 mg/dL — ABNORMAL HIGH (ref 0.61–1.24)
GFR calc Af Amer: 49 mL/min — ABNORMAL LOW (ref >60)
GFR calc non Af Amer: 42 mL/min — ABNORMAL LOW (ref >60)
Glucose, Bld: 332 mg/dL — ABNORMAL HIGH (ref 70–99)
Potassium: 3.1 mmol/L — ABNORMAL LOW (ref 3.5–5.1)
Sodium: 138 mmol/L (ref 135–145)

## 2018-09-27 LAB — GLUCOSE, CAPILLARY
Glucose-Capillary: 288 mg/dL — ABNORMAL HIGH (ref 70–99)
Glucose-Capillary: 370 mg/dL — ABNORMAL HIGH (ref 70–99)
Glucose-Capillary: 310 mg/dL — ABNORMAL HIGH (ref 70–99)
Glucose-Capillary: 317 mg/dL — ABNORMAL HIGH (ref 70–99)

## 2018-09-27 LAB — UREA NITROGEN, URINE: Urea Nitrogen, Ur: 467 mg/dL

## 2018-09-27 MED ORDER — DONEPEZIL HCL 10 MG PO TABS
10.0000 mg | ORAL_TABLET | Freq: Every day | ORAL | Status: DC
  Administered 2018-09-27: 10 mg via ORAL
  Filled 2018-09-27: qty 1

## 2018-09-27 MED ORDER — MONTELUKAST SODIUM 10 MG PO TABS
10.0000 mg | ORAL_TABLET | Freq: Every day | ORAL | Status: DC
  Administered 2018-09-27: 10 mg via ORAL
  Filled 2018-09-27: qty 1

## 2018-09-27 MED ORDER — POTASSIUM CHLORIDE CRYS ER 20 MEQ PO TBCR
40.0000 meq | EXTENDED_RELEASE_TABLET | Freq: Once | ORAL | Status: AC
  Administered 2018-09-27: 40 meq via ORAL
  Filled 2018-09-27: qty 2

## 2018-09-27 MED ORDER — DARIFENACIN HYDROBROMIDE ER 7.5 MG PO TB24
7.5000 mg | ORAL_TABLET | Freq: Every day | ORAL | Status: DC
  Administered 2018-09-27 – 2018-09-28 (×2): 7.5 mg via ORAL
  Filled 2018-09-27 (×2): qty 1

## 2018-09-27 MED ORDER — CAPSAICIN 0.025 % EX CREA
TOPICAL_CREAM | Freq: Two times a day (BID) | CUTANEOUS | Status: DC
  Administered 2018-09-27 – 2018-09-28 (×3): via TOPICAL
  Filled 2018-09-27: qty 60

## 2018-09-27 MED ORDER — LATANOPROST 0.005 % OP SOLN
1.0000 [drp] | Freq: Every day | OPHTHALMIC | Status: DC
  Administered 2018-09-27: 1 [drp] via OPHTHALMIC
  Filled 2018-09-27: qty 2.5

## 2018-09-27 MED ORDER — FERROUS SULFATE 325 (65 FE) MG PO TABS
325.0000 mg | ORAL_TABLET | Freq: Every day | ORAL | Status: DC
  Administered 2018-09-28: 09:00:00 325 mg via ORAL
  Filled 2018-09-27: qty 1

## 2018-09-27 MED ORDER — POTASSIUM CHLORIDE CRYS ER 20 MEQ PO TBCR
20.0000 meq | EXTENDED_RELEASE_TABLET | Freq: Three times a day (TID) | ORAL | Status: DC
  Administered 2018-09-27 – 2018-09-28 (×4): 20 meq via ORAL
  Filled 2018-09-27 (×4): qty 1

## 2018-09-27 MED ORDER — INSULIN GLARGINE 100 UNIT/ML ~~LOC~~ SOLN
30.0000 [IU] | Freq: Two times a day (BID) | SUBCUTANEOUS | Status: DC
  Administered 2018-09-27 (×2): 30 [IU] via SUBCUTANEOUS
  Filled 2018-09-27 (×5): qty 0.3

## 2018-09-27 MED ORDER — GUAIFENESIN-DM 100-10 MG/5ML PO SYRP
5.0000 mL | ORAL_SOLUTION | ORAL | Status: DC | PRN
  Administered 2018-09-27: 5 mL via ORAL
  Filled 2018-09-27: qty 5

## 2018-09-27 MED ORDER — MEMANTINE HCL 10 MG PO TABS
5.0000 mg | ORAL_TABLET | Freq: Two times a day (BID) | ORAL | Status: DC
  Administered 2018-09-27 – 2018-09-28 (×3): 5 mg via ORAL
  Filled 2018-09-27 (×3): qty 1

## 2018-09-27 MED ORDER — INSULIN ASPART 100 UNIT/ML ~~LOC~~ SOLN
6.0000 [IU] | Freq: Three times a day (TID) | SUBCUTANEOUS | Status: DC
  Administered 2018-09-27 – 2018-09-28 (×4): 6 [IU] via SUBCUTANEOUS

## 2018-09-27 NOTE — Evaluation (Signed)
Physical Therapy Evaluation Patient Details Name: Mike Hunter MRN: [id number] DOB: 04/05/1951 Today's Date: 09/27/2018   History of Present Illness  68 yo admitted after outpatient lab work identified acute kidney injury. PMHx: bipolar, DM, obesity, CHF, OSA on CPAP, CAD, asthma  Clinical Impression  Pt pleasant and eager to get out of chair and back to bed. Pt reports he uses walking stick at all times but has had several falls with one recently on daughter's steps. Pt with improved stability with use of RW and educated for need to use at all times with gait with pt currently agreeable. Pt with notable posterior bias for balance and benefits from guarding/assist for all mobility. Pt with above and below deficits who will benefit from acute therapy to maximize safety and function. Pt reports wife can assist at home and that he would refuse sNF. Will plan on HHPT with supervision unless family unable.     Follow Up Recommendations Home health PT;Supervision/Assistance - 24 hour    Equipment Recommendations  Rolling walker with 5" wheels    Recommendations for Other Services OT consult     Precautions / Restrictions Precautions Precautions: Fall      Mobility  Bed Mobility Overal bed mobility: Modified Independent             General bed mobility comments: pt able to transition sitting to supine without assist  Transfers Overall transfer level: Needs assistance   Transfers: Sit to/from Stand Sit to Stand: Min assist         General transfer comment: min assist to stand from chair with cues for hand placement. required 2 trials and pt with posterior LOB initial trial and returned to sitting  Ambulation/Gait Ambulation/Gait assistance: Min assist Gait Distance (Feet): 12 Feet Assistive device: Rolling walker (2 wheeled) Gait Pattern/deviations: Step-through pattern;Trunk flexed;Decreased stride length;Wide base of support   Gait velocity interpretation: <1.8 ft/sec,  indicate of risk for recurrent falls General Gait Details: pt with cues for direction and assist to direct RW. Pt able to walk around bed but declined further mobility stating arm and hip pain  Stairs            Wheelchair Mobility    Modified Rankin (Stroke Patients Only)       Balance Overall balance assessment: Needs assistance;History of Falls Sitting-balance support: Feet supported Sitting balance-Leahy Scale: Fair       Standing balance-Leahy Scale: Poor Standing balance comment: bil UE support in standing, with single UE support to urinate required min assist due to posterior lean                             Pertinent Vitals/Pain Pain Assessment: 0-10 Pain Score: 4  Pain Location: knees, back, hips, during urination kidney and penis Pain Descriptors / Indicators: Aching;Constant;Stabbing Pain Intervention(s): Limited activity within patient's tolerance;Monitored during session;Repositioned    Home Living Family/patient expects to be discharged to:: Private residence Living Arrangements: Spouse/significant other Available Help at Discharge: Family;Available 24 hours/day Type of Home: House Home Access: Stairs to enter Entrance Stairs-Rails: Right Entrance Stairs-Number of Steps: 3 Home Layout: One level Home Equipment: Cane - single point      Prior Function Level of Independence: Needs assistance   Gait / Transfers Assistance Needed: uses walking stick with report of several falls this year  ADL's / Homemaking Assistance Needed: wife does the cooking, cleaning, driving, assists with pt putting on suspenders, sponge baths and  zippers        Hand Dominance        Extremity/Trunk Assessment   Upper Extremity Assessment Upper Extremity Assessment: Generalized weakness    Lower Extremity Assessment Lower Extremity Assessment: Generalized weakness    Cervical / Trunk Assessment Cervical / Trunk Assessment: Kyphotic  Communication    Communication: No difficulties  Cognition Arousal/Alertness: Awake/alert Behavior During Therapy: Flat affect Overall Cognitive Status: No family/caregiver present to determine baseline cognitive functioning                                        General Comments      Exercises     Assessment/Plan    PT Assessment Patient needs continued PT services  PT Problem List Decreased strength;Decreased balance;Decreased cognition;Decreased knowledge of use of DME;Decreased mobility;Decreased activity tolerance;Decreased coordination;Decreased safety awareness       PT Treatment Interventions DME instruction;Functional mobility training;Balance training;Patient/family education;Gait training;Therapeutic activities;Neuromuscular re-education;Stair training;Therapeutic exercise;Cognitive remediation    PT Goals (Current goals can be found in the Care Plan section)  Acute Rehab PT Goals Patient Stated Goal: return home PT Goal Formulation: With patient Time For Goal Achievement: 10/11/18 Potential to Achieve Goals: Fair    Frequency Min 3X/week   Barriers to discharge        Co-evaluation               AM-PAC PT "6 Clicks" Mobility  Outcome Measure Help needed turning from your back to your side while in a flat bed without using bedrails?: A Little Help needed moving from lying on your back to sitting on the side of a flat bed without using bedrails?: A Little Help needed moving to and from a bed to a chair (including a wheelchair)?: A Little Help needed standing up from a chair using your arms (e.g., wheelchair or bedside chair)?: A Little Help needed to walk in hospital room?: A Little Help needed climbing 3-5 steps with a railing? : A Lot 6 Click Score: 17    End of Session Equipment Utilized During Treatment: Gait belt Activity Tolerance: Patient tolerated treatment well Patient left: in bed;with call bell/phone within reach Nurse Communication:  Mobility status;Precautions PT Visit Diagnosis: Other abnormalities of gait and mobility (R26.89);Muscle weakness (generalized) (M62.81);Unsteadiness on feet (R26.81);History of falling (Z91.81)    Time: 8182-9937 PT Time Calculation (min) (ACUTE ONLY): 20 min   Charges:   PT Evaluation $PT Eval Moderate Complexity: 1 Mod          [place], PT Acute Rehabilitation Services Pager: [phone] Office: [phone]   Tiny Rietz B Quintavious Rinck 09/27/2018, 2:17 PM

## 2018-09-27 NOTE — Progress Notes (Signed)
Progress Note    Mike Hunter  DXA:[id number] DOB: 04/17/1951  DOA: 09/25/2018 PCP: Lilian Coma., MD    Brief Narrative:     Medical records reviewed and are as summarized below:  Mike Hunter is an 68 y.o. male  with medical history significant for bipolar disorder, insulin-dependent diabetes mellitus, obesity (BMI 40), chronic diastolic CHF, OSA on CPAP, and minimal CAD on cath per the report of patient, now presenting to the emergency department for evaluation of abnormal blood work on his outpatient labs.   Assessment/Plan:   Principal Problem:   AKI (acute kidney injury) ([place]) Active Problems:   Insulin-requiring or dependent type II diabetes mellitus (HCC)   Hypokalemia   Hypomagnesemia   Chronic diastolic CHF (congestive heart failure) (HCC)   CAD (coronary artery disease)   Asthma   Depression with anxiety   Asthma with status asthmaticus  Acute kidney injury  - Presents with AKI and hypokalemia on outpatient blood work, likely secondary to recent increase in diuretics  - His wt is down and there is no leg swelling, BNP elevation, or CHF findings on CXR  - hold diuretics and ARB -recheck in AM and resume diuretic then -suspect the addition of zaroxolyn every other day diuresed him too much  SOB -multifactorial -? COPD/deconditioning/obesity/CHF -has outpatient appointment with pulmonology  Hypokalemia; hypomagnesemia  - repleted and recheck  Chronic diastolic CHF  - No leg swelling or rales on exam, BNP is low and no edema or congestion noted on CXR, wt down ~6 lbs from office visit ~3 wks ago - echo: 1. The left ventricle has hyperdynamic systolic function, with an ejection fraction of >65%. The cavity size was normal. No evidence of left ventricular regional wall motion abnormalities.  2. The right ventricle has normal systolc function. The cavity was normal. There is no increase in right ventricular wall thickness. Right ventricular systolic pressure  could not be assessed.     3. There is trivial stenosis of the aortic valve  CAD  - No chest pain on admission though reports occasional central chest discomfort over the past few months  - Patient reports history of cath x3 with "minimal" disease per patient and no PCI  - Continue antiplatelets, statin, beta-blocker -chest pain re-producible with palpation   Bipolar disorder  - Stable    Insulin-dependent DM  - A1c was 6.9% in February 2020  - lantus and SSI for now-- titrate meds as needed to home dose   Asthma  - Continue albuterol MDI   obesity Body mass index is 38.9 kg/m.  OSA -wears CPAP    Family Communication/Anticipated D/C date and plan/Code Status   DVT prophylaxis: heparin Code Status: Full Code.  Family Communication: spoke with wife on phone Disposition Plan: PT eval   Medical Consultants:    None.   Subjective:   C/o "burning" when he urinates-- asking for condom cath  Objective:    Vitals:   09/26/18 2305 09/27/18 0359 09/27/18 0811 09/27/18 0856  BP: 122/77 (!) 115/59 112/69   Pulse: 88 80 80   Resp: 20 20 20    Temp: 97.7 F (36.5 C) 97.9 F (36.6 C) 98.6 F (37 C)   TempSrc: Oral Oral Oral   SpO2: 96% 98% 95% 95%  Weight:  126.5 kg    Height:        Intake/Output Summary (Last 24 hours) at 09/27/2018 1154 Last data filed at 09/27/2018 2094 Gross per 24 hour  Intake  1183 ml  Output 1600 ml  Net -417 ml   Filed Weights   09/25/18 1724 09/26/18 0040 09/27/18 0359  Weight: 127 kg 128.4 kg 126.5 kg    Exam: Chronically ill appearing rrr Diminished, few crackles at bases Obese abdomen Difficult to keep on task  Data Reviewed:   I have personally reviewed following labs and imaging studies:  Labs: Labs show the following:   Basic Metabolic Panel: Recent Labs  Lab 09/25/18 1743 09/26/18 0455 09/26/18 1442 09/27/18 0512  NA 135 134* 133* 138  K 2.5* 2.3* 2.8* 3.1*  CL 88* 90* 89* 94*  CO2 31 29 29 29    GLUCOSE 245* 228* 307* 332*  BUN 62* 53* 46* 36*  CREATININE 1.91* 1.86* 1.72* 1.63*  CALCIUM 9.4 9.0 9.2 9.4  MG 1.5* 1.9  --   --   PHOS 3.8  --   --   --    GFR Estimated Creatinine Clearance: 58 mL/min (A) (by C-G formula based on SCr of 1.63 mg/dL (H)). Liver Function Tests: Recent Labs  Lab 09/25/18 1743  AST 18  ALT 18  ALKPHOS 84  BILITOT 0.6  PROT 8.0  ALBUMIN 3.9   No results for input(s): LIPASE, AMYLASE in the last 168 hours. No results for input(s): AMMONIA in the last 168 hours. Coagulation profile No results for input(s): INR, PROTIME in the last 168 hours.  CBC: Recent Labs  Lab 09/25/18 1743 09/26/18 0455 09/27/18 0512  WBC 13.8* 13.2* 10.5  NEUTROABS  --  9.0*  --   HGB 13.6 12.6* 12.3*  HCT 40.8 36.1* 37.4*  MCV 87.2 84.9 86.0  PLT 332 285 278   Cardiac Enzymes: Recent Labs  Lab 09/25/18 1743  TROPONINI <0.03   BNP (last 3 results) No results for input(s): PROBNP in the last 8760 hours. CBG: Recent Labs  Lab 09/26/18 0559 09/26/18 1206 09/26/18 1643 09/26/18 2106 09/27/18 0543  GLUCAP 228* 310* 261* 314* 288*   D-Dimer: No results for input(s): DDIMER in the last 72 hours. Hgb A1c: No results for input(s): HGBA1C in the last 72 hours. Lipid Profile: No results for input(s): CHOL, HDL, LDLCALC, TRIG, CHOLHDL, LDLDIRECT in the last 72 hours. Thyroid function studies: No results for input(s): TSH, T4TOTAL, T3FREE, THYROIDAB in the last 72 hours.  Invalid input(s): FREET3 Anemia work up: No results for input(s): VITAMINB12, FOLATE, FERRITIN, TIBC, IRON, RETICCTPCT in the last 72 hours. Sepsis Labs: Recent Labs  Lab 09/25/18 1743 09/26/18 0455 09/27/18 0512  WBC 13.8* 13.2* 10.5    Microbiology No results found for this or any previous visit (from the past 240 hour(s)).  Procedures and diagnostic studies:  Dg Chest 2 View  Result Date: 09/25/2018 CLINICAL DATA:  Chest pain and shortness of breath. EXAM: CHEST - 2 VIEW  COMPARISON:  08/02/2018 FINDINGS: The cardiomediastinal contours are unchanged. Unchanged mild cardiomegaly. Aortic atherosclerosis. Pulmonary vasculature is normal. No consolidation, pleural effusion, or pneumothorax. No acute osseous abnormalities are seen. IMPRESSION: 1. No acute findings. 2. Stable mild cardiomegaly and Aortic Atherosclerosis (ICD10-I70.0). Electronically Signed   By: Keith Rake M.D.   On: 09/25/2018 19:28    Medications:   . atorvastatin  40 mg Oral QPM  . capsaicin   Topical BID  . carvedilol  12.5 mg Oral BID WC  . darifenacin  7.5 mg Oral Daily  . donepezil  10 mg Oral QHS  . [START ON 09/28/2018] ferrous sulfate  325 mg Oral Q breakfast  . heparin  5,000 Units Subcutaneous Q8H  . insulin aspart  0-5 Units Subcutaneous QHS  . insulin aspart  0-9 Units Subcutaneous TID WC  . insulin aspart  6 Units Subcutaneous TID WC  . insulin glargine  30 Units Subcutaneous BID  . latanoprost  1 drop Both Eyes QHS  . memantine  5 mg Oral BID  . mometasone-formoterol  2 puff Inhalation BID  . montelukast  10 mg Oral QHS  . pantoprazole  80 mg Oral Q1200  . potassium chloride SA  20 mEq Oral TID  . sertraline  50 mg Oral BH-q7a  . sodium chloride flush  3 mL Intravenous Q12H  . sodium chloride flush  3 mL Intravenous Q12H   Continuous Infusions: . sodium chloride       LOS: 1 day   Geradine Girt  [facility]   How to contact the [facility] Attending or Consulting provider [place] or covering provider during after hours [place], for this patient?  1. Check the care team in [facility] and look for a) attending/consulting [facility] provider listed and b) the [facility] team listed 2. Log into [email or web address] and use [place]'s universal password to access. If you do not have the password, please contact the hospital operator. 3. Locate the [facility] provider you are looking for under [facility] and page to a number that you can be directly reached. 4. If you still have difficulty  reaching the provider, please page the [facility] (Director on Call) for the Hospitalists listed on amion for assistance.  09/27/2018, 11:54 AM

## 2018-09-27 NOTE — Progress Notes (Signed)
   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

## 2018-09-27 NOTE — Progress Notes (Signed)
Inpatient Diabetes Program Recommendations  AACE/ADA: New Consensus Statement on Inpatient Glycemic Control   Target Ranges:  Prepandial:   less than 140 mg/dL      Peak postprandial:   less than 180 mg/dL (1-2 hours)      Critically ill patients:  140 - 180 mg/dL   Results for SAAGAR, TORTORELLA (MRN [id number]) as of 09/27/2018 09:52  Ref. Range 09/26/2018 05:59 09/26/2018 12:06 09/26/2018 16:43 09/26/2018 21:06 09/27/2018 05:43  Glucose-Capillary Latest Ref Range: 70 - 99 mg/dL 228 (H) 310 (H) 261 (H) 314 (H) 288 (H)   Review of Glycemic Control  Diabetes history: DM2 Outpatient Diabetes medications: Tresiba 65 units BID, Novolog 26 units with breakfast, Novolog 30 units with lunch, Novolog 36-44 units with supper, Metformin 1000 mg BID Current orders for Inpatient glycemic control: Lantus 30 units BID, Novolog 0-9 units TID with meals, Novolog 0-5 units QHS  Inpatient Diabetes Program Recommendations:   Insulin - Basal: Noted Lantus increased to 30 units BID starting today.  Insulin - Meal Coverage: Please consider ordering Novolog 6 units TID with meals for meal coverage if patient eats at least 50% of meals.  Thanks, Barnie Alderman, RN, MSN, CDE Diabetes Coordinator Inpatient Diabetes Program [phone] (Team Pager from 8am to 5pm)

## 2018-09-27 NOTE — Progress Notes (Signed)
   09/27/18 1500  Clinical Encounter Type  Visited With Patient  Visit Type Initial;Spiritual support  Referral From Nurse  Consult/Referral To Chaplain  Spiritual Encounters  Spiritual Needs Emotional;Prayer  Stress Factors  Patient Stress Factors Major life changes;Health changes;Exhausted   Responded to spiritual care consult. Nurse stated that PT is dealing with a lot at home and family relationships. Upon entering PT was alert and wanting to talk. Pt shared some events of his past that he wanted to get off his chest. PT shared some of his struggles with letting go of his past mistakes. I offered spiritual care with empathic listening, ministry of presence, words of comfort and prayer. Pt was very open to receiving comforting words and thankful for the Chaplain presence. PT stated that he feels emotionally better.  Chaplain Fidel Levy  [phone]

## 2018-09-27 NOTE — Progress Notes (Addendum)
Placed patient on CPAP for HS via [facility]. Previous settings. Max pressure  24, min pressure 8cm H20.

## 2018-09-28 LAB — TROPONIN I: Troponin I: <0.03 ng/mL (ref <0.03)

## 2018-09-28 LAB — BASIC METABOLIC PANEL
Anion gap: 13 (ref 5–15)
BUN: 30 mg/dL — ABNORMAL HIGH (ref 8–23)
CO2: 27 mmol/L (ref 22–32)
Calcium: 9.1 mg/dL (ref 8.9–10.3)
Chloride: 98 mmol/L (ref 98–111)
Creatinine, Ser: 1.7 mg/dL — ABNORMAL HIGH (ref 0.61–1.24)
GFR calc Af Amer: 47 mL/min — ABNORMAL LOW (ref >60)
GFR calc non Af Amer: 40 mL/min — ABNORMAL LOW (ref >60)
Glucose, Bld: 251 mg/dL — ABNORMAL HIGH (ref 70–99)
Potassium: 3.5 mmol/L (ref 3.5–5.1)
Sodium: 138 mmol/L (ref 135–145)

## 2018-09-28 LAB — D-DIMER, QUANTITATIVE: D-Dimer, Quant: <0.27 ug/mL-FEU (ref 0.00–0.50)

## 2018-09-28 LAB — CBC
HCT: 36.2 % — ABNORMAL LOW (ref 39.0–52.0)
Hemoglobin: 12 g/dL — ABNORMAL LOW (ref 13.0–17.0)
MCH: 28.7 pg (ref 26.0–34.0)
MCHC: 33.1 g/dL (ref 30.0–36.0)
MCV: 86.6 fL (ref 80.0–100.0)
Platelets: 252 10*3/uL (ref 150–400)
RBC: 4.18 MIL/uL — ABNORMAL LOW (ref 4.22–5.81)
RDW: 12.7 % (ref 11.5–15.5)
WBC: 9.8 10*3/uL (ref 4.0–10.5)
nRBC: 0 % (ref 0.0–0.2)

## 2018-09-28 LAB — MAGNESIUM: Magnesium: 1.8 mg/dL (ref 1.7–2.4)

## 2018-09-28 LAB — GLUCOSE, CAPILLARY
Glucose-Capillary: 237 mg/dL — ABNORMAL HIGH (ref 70–99)
Glucose-Capillary: 299 mg/dL — ABNORMAL HIGH (ref 70–99)

## 2018-09-28 MED ORDER — MAGNESIUM SULFATE 2 GM/50ML IV SOLN
2.0000 g | Freq: Once | INTRAVENOUS | Status: AC
  Administered 2018-09-28: 09:00:00 2 g via INTRAVENOUS
  Filled 2018-09-28: qty 50

## 2018-09-28 MED ORDER — CAPSAICIN 0.025 % EX CREA: TOPICAL_CREAM | Freq: Two times a day (BID) | CUTANEOUS | 0 refills | Status: DC

## 2018-09-28 MED ORDER — INSULIN GLARGINE 100 UNIT/ML ~~LOC~~ SOLN
36.0000 [IU] | Freq: Two times a day (BID) | SUBCUTANEOUS | Status: DC
  Administered 2018-09-28: 09:00:00 36 [IU] via SUBCUTANEOUS
  Filled 2018-09-28 (×3): qty 0.36

## 2018-09-28 MED ORDER — NITROGLYCERIN 0.4 MG SL SUBL
SUBLINGUAL_TABLET | SUBLINGUAL | Status: AC
  Administered 2018-09-28: 05:00:00 0.4 mg
  Filled 2018-09-28: qty 1

## 2018-09-28 MED ORDER — NITROGLYCERIN 0.4 MG SL SUBL
SUBLINGUAL_TABLET | SUBLINGUAL | Status: AC
  Filled 2018-09-28: qty 1

## 2018-09-28 MED ORDER — TORSEMIDE 20 MG PO TABS
40.0000 mg | ORAL_TABLET | Freq: Every day | ORAL | Status: DC
  Administered 2018-09-28: 40 mg via ORAL
  Filled 2018-09-28: qty 2

## 2018-09-28 MED ORDER — ALUM & MAG HYDROXIDE-SIMETH 200-200-20 MG/5ML PO SUSP
15.0000 mL | Freq: Once | ORAL | Status: AC
  Administered 2018-09-28: 15 mL via ORAL
  Filled 2018-09-28: qty 30

## 2018-09-28 MED ORDER — NITROGLYCERIN 0.4 MG SL SUBL
0.4000 mg | SUBLINGUAL_TABLET | SUBLINGUAL | Status: DC | PRN
  Administered 2018-09-28 (×2): 0.4 mg via SUBLINGUAL

## 2018-09-28 NOTE — Progress Notes (Signed)
Provider ordered troponin. Patient states pain is till 7/10. Prn norco given. Will wait and labs and notify provider with changes. Will continue to monitor patient.

## 2018-09-28 NOTE — Progress Notes (Signed)
Physical Therapy Treatment Patient Details Name: Mike Hunter MRN: [id number] DOB: July 04, 1950 Today's Date: 09/28/2018    History of Present Illness 68 yo admitted after outpatient lab work identified acute kidney injury. PMHx: bipolar, DM, obesity, CHF, OSA on CPAP, CAD, asthma    PT Comments    Pt with progression of transfers and gait today with ability to walk in hallway and perform transfers without physical assist. PT declined attempting stairs. PT educated for HEP and walking program and states he plans to start a walking regimen with wife. Pt educated for balance deficits and continued need for RW at all times which he verbalizes understanding.     Follow Up Recommendations  Home health PT;Supervision/Assistance - 24 hour     Equipment Recommendations  Rolling walker with 5" wheels    Recommendations for Other Services       Precautions / Restrictions Precautions Precautions: Fall    Mobility  Bed Mobility Overal bed mobility: Modified Independent             General bed mobility comments: bed flat with rail, donned socks in supine  Transfers Overall transfer level: Needs assistance   Transfers: Sit to/from Stand Sit to Stand: Supervision         General transfer comment: supervision for safety with cues for hand placement  Ambulation/Gait Ambulation/Gait assistance: Min guard Gait Distance (Feet): 90 Feet Assistive device: Rolling walker (2 wheeled) Gait Pattern/deviations: Step-through pattern;Trunk flexed;Decreased stride length;Wide base of support   Gait velocity interpretation: <1.8 ft/sec, indicate of risk for recurrent falls General Gait Details: pt with good control and stability with use of RW today. 4 standing rests propping on RW but able to continue and short period of no UE assist "dancing" at nursing station   Stairs             Wheelchair Mobility    Modified Rankin (Stroke Patients Only)       Balance Overall balance  assessment: Needs assistance;History of Falls Sitting-balance support: Feet supported Sitting balance-Leahy Scale: Fair       Standing balance-Leahy Scale: Fair Standing balance comment: bil UE support during gait, able to stand briefly without UE support                            Cognition Arousal/Alertness: Awake/alert Behavior During Therapy: Flat affect Overall Cognitive Status: No family/caregiver present to determine baseline cognitive functioning                                        Exercises General Exercises - Lower Extremity Long Arc Quad: AROM;15 reps;Seated;Both Hip Flexion/Marching: AROM;15 reps;Seated;Both    General Comments        Pertinent Vitals/Pain Pain Assessment: No/denies pain Pain Score: 3  Pain Location: knees and back Pain Descriptors / Indicators: Aching Pain Intervention(s): Limited activity within patient's tolerance    Home Living                      Prior Function            PT Goals (current goals can now be found in the care plan section) Progress towards PT goals: Progressing toward goals    Frequency           PT Plan Current plan remains appropriate    Co-evaluation  AM-PAC PT "6 Clicks" Mobility   Outcome Measure  Help needed turning from your back to your side while in a flat bed without using bedrails?: None Help needed moving from lying on your back to sitting on the side of a flat bed without using bedrails?: A Little Help needed moving to and from a bed to a chair (including a wheelchair)?: A Little Help needed standing up from a chair using your arms (e.g., wheelchair or bedside chair)?: A Little Help needed to walk in hospital room?: A Little Help needed climbing 3-5 steps with a railing? : A Lot 6 Click Score: 18    End of Session Equipment Utilized During Treatment: Gait belt Activity Tolerance: Patient tolerated treatment well Patient left: with  call bell/phone within reach;in chair;with chair alarm set Nurse Communication: Mobility status;Precautions PT Visit Diagnosis: Other abnormalities of gait and mobility (R26.89);Muscle weakness (generalized) (M62.81);Unsteadiness on feet (R26.81);History of falling (Z91.81)     Time: 6144-3154 PT Time Calculation (min) (ACUTE ONLY): 19 min  Charges:  $Gait Training: 8-22 mins                     [place], PT Acute Rehabilitation Services Pager: [phone] Office: [place] 09/28/2018, 11:42 AM

## 2018-09-28 NOTE — TOC Initial Note (Signed)
Transition of Care [facility]) - Initial/Assessment Note    Patient Details  Name: Mike Hunter MRN: [id number] Date of Birth: 11-09-1950  Transition [facility] CM/SW Contact:    Mike Hunter Phone Number: [phone] 09/28/2018, 11:59 AM  Clinical Narrative:                 Patient lives at home with spouse; PCP: Mike Hunter., MD; has private insurance with Mike Hunter with prescription drug coverage; [facility] choice [facility] [address]; patient is agreeable to [facility] services provided by [facility]; pt is refusing a rolling walker at this time stated " they tell me its what Hunter need but Hunter don't want it."   Expected Discharge Plan: [place] Barriers to Discharge: No Barriers Identified   Patient Goals and CMS Choice Patient states their goals for this hospitalization and ongoing recovery are:: to get stronger CMS Medicare.gov Compare Post Acute Care list provided to:: Patient Choice offered to / list presented to : Patient  Expected Discharge Plan and Services Expected Discharge Plan: [place] In-house Referral: NA Discharge Planning Services: CM Consult Post Acute Care Choice: NA Living arrangements for the past 2 months: Single Family Home Expected Discharge Date: 09/28/18               DME Arranged: (Pt is refusing a Conservation officer, nature) DME Agency: NA HH Arranged: Therapist, sports, PT [place] Agency: [facility]  Prior Living Arrangements/Services Living arrangements for the past 2 months: [place] Lives with:: Spouse Patient language and need for interpreter reviewed:: No Do you feel safe going back to the place where you live?: Yes      Need for Family Participation in Patient Care: No (Comment) Care giver support system in place?: Yes (comment)      Activities of Daily Living Home Assistive Devices/Equipment: CPAP, Cane (specify quad or straight) ADL Screening (condition at time of admission) Patient's cognitive  ability adequate to safely complete daily activities?: Yes Is the patient deaf or have difficulty hearing?: Yes Does the patient have difficulty seeing, even when wearing glasses/contacts?: No Does the patient have difficulty concentrating, remembering, or making decisions?: No Patient able to express need for assistance with ADLs?: Yes Does the patient have difficulty dressing or bathing?: No Independently performs ADLs?: Yes (appropriate for developmental age) Does the patient have difficulty walking or climbing stairs?: Yes Weakness of Legs: Both Weakness of Arms/Hands: Both  Permission Sought/Granted Permission sought to share information with : Case Manager Permission granted to share information with : Yes, Verbal Permission Granted  Share Information with NAME: Spouse Mike Hunter           Emotional Assessment Appearance:: Developmentally appropriate Attitude/Demeanor/Rapport: Gracious Affect (typically observed): Accepting Orientation: : Oriented to Self, Oriented to  Time, Oriented to Place, Oriented to Situation Alcohol / Substance Use: Not Applicable Psych Involvement: No (comment)  Admission diagnosis:  Hypokalemia [E87.6] Patient Active Problem List   Diagnosis Date Noted  . Hypokalemia 09/26/2018  . Hypomagnesemia 09/26/2018  . Chronic diastolic CHF (congestive heart failure) ([place]) 09/26/2018  . CAD (coronary artery disease) 09/26/2018  . Asthma 09/26/2018  . Depression with anxiety 09/26/2018  . Asthma with status asthmaticus   . AKI (acute kidney injury) ([place]) 09/25/2018  . Type II or unspecified type diabetes mellitus with neurological manifestations, not stated as uncontrolled(250.60) 02/09/2013  . Unspecified urinary incontinence 02/09/2013  . Esophageal reflux 02/09/2013  . Insulin-requiring or dependent type  II diabetes mellitus ([place]) 12/30/2012  . Pyogenic arthritis of knee ([place]) 12/30/2012   PCP:  Mike Dales I, MD Pharmacy:   [id number]  [place], [place] - [address] [place] [address] Phone: [phone] Fax: [place], [place] [address] [place] [address] Phone: [phone] Fax: [phone]     Social Determinants of Health (SDOH) Interventions    Readmission Risk Interventions No flowsheet data found.

## 2018-09-28 NOTE — Discharge Summary (Signed)
Physician Discharge Summary  Jhaden Pizzuto Eriksen WVP:[id number] DOB: [birth year] DOA: 09/25/2018  PCP: Thornton Dales I, MD  Admit date: 09/25/2018 Discharge date: 09/28/2018  Admitted From: home Discharge disposition: home   Recommendations for Outpatient Follow-Up:   1. BMP 1 week 2. Home health 3. ? GI referral for non-cardiac chest pain 4. ? Referral to pulmonary/cardiac rehab for exercise/breathing conservation     Discharge Diagnosis:   Principal Problem:   AKI (acute kidney injury) ([place]) Active Problems:   Insulin-requiring or dependent type II diabetes mellitus ([place])   Hypokalemia   Hypomagnesemia   Chronic diastolic CHF (congestive heart failure) ([facility])   CAD (coronary artery disease)   Asthma   Depression with anxiety   Asthma with status asthmaticus    Discharge Condition: Improved.  Diet recommendation: Low sodium, heart healthy.  Carbohydrate-modified  Wound care: None.  Code status: Full.   History of Present Illness:   Mike Hunter is a 68 y.o. male with medical history significant for bipolar disorder, insulin-dependent diabetes mellitus, obesity (BMI 40), chronic diastolic CHF, OSA on CPAP, and minimal CAD on cath per the report of patient, now presenting to the emergency department for evaluation of abnormal blood work on his outpatient labs.  Patient reports several months of waxing and waning dyspnea, cough, and occasional chest pains.  He has been following closely with his outpatient physicians for this, was instructed to double his diuretic on 09/01/2018, was found to have a critically low potassium on outpatient blood work, and directed to the ED for evaluation of this.  Patient reports occasional, fleeting pains in the central chest, sometimes involving the left arm over the past several months.  He reports history of cardiac cath with minimal CAD per his report.  States that his weight has been fluctuating.  He reports continued adherence with  his diuretic, magnesium oxide, and supplemental potassium.  Denies any fevers, chills, chest pain at this time, travel, or sick contacts.   Hospital Course by Problem:   Acute kidney injury -Presents with AKI and hypokalemia on outpatient blood work, likely secondary to recent increase in diuretics -His wt is down and there is no leg swelling, BNP elevation, or CHF findings on CXR -suspect the addition of zaroxolyn every other day diuresed him too much -resume torosemide with close PCP follow up -? zaroxolyln PRN -fluid restriction encouraged as well  SOB -multifactorial -? COPD/deconditioning/obesity/CHF -has outpatient appointment with pulmonology-- ? PFTs -? Referral to pulm/cardiac rehab -d dimer normal so doubt PE -did not qualify for home O2 (patient said he felt better with the [place] but there was no oxygen on... ? Anxiety)  Hypokalemia; hypomagnesemia - repleted   Chronic diastolic CHF -No leg swelling or rales on exam, BNP is low and no edema or congestion noted on CXR, wt down ~6 lbs from office visit ~3 wks ago -echo:1. The left ventricle has hyperdynamic systolic function, with an ejection fraction of >65%. The cavity size was normal. No evidence of left ventricular regional wall motion abnormalities. 2. The right ventricle has normal systolc function. The cavity was normal. There is no increase in right ventricular wall thickness. Right ventricular systolic pressure could not be assessed. 3. There is trivial stenosis of the aortic valve  CAD -No chest pain on admission though reports occasional central chest discomfort over the past few months -Patient reports history of cath x3 with "minimal" disease per patient and no PCI -Continue antiplatelets, statin, beta-blocker -chest pain re-producible  with palpation-- capsicin trial with improvement -? GI referral if continues  Bipolar disorder -seems very anxious-- defer medication changes to  PCP  Insulin-dependent DM -A1c was 6.9% in February 2020 -lantus and SSI for now-- titrate meds as needed to home dose  Asthma -Continue albuterol MDI   obesity Estimated body mass index is 39.22 kg/m as calculated from the following:   Height as of this encounter: 5\' 11"  (1.803 m).   Weight as of this encounter: 127.6 kg.  OSA -wears CPAP    Medical Consultants:      Discharge Exam:   Vitals:   09/28/18 0810 09/28/18 0849  BP: 118/76   Pulse: 84   Resp: 16   Temp:    SpO2: 98% 98%   Vitals:   09/28/18 0449 09/28/18 0525 09/28/18 0810 09/28/18 0849  BP: 118/71 119/76 118/76   Pulse: 90 80 84   Resp:   16   Temp:      TempSrc:      SpO2: 97% 97% 98% 98%  Weight:      Height:        General exam: Appears calm and comfortable.  Spoke with wife   The results of significant diagnostics from this hospitalization (including imaging, microbiology, ancillary and laboratory) are listed below for reference.     Procedures and Diagnostic Studies:   Dg Chest 2 View  Result Date: 09/25/2018 CLINICAL DATA:  Chest pain and shortness of breath. EXAM: CHEST - 2 VIEW COMPARISON:  08/02/2018 FINDINGS: The cardiomediastinal contours are unchanged. Unchanged mild cardiomegaly. Aortic atherosclerosis. Pulmonary vasculature is normal. No consolidation, pleural effusion, or pneumothorax. No acute osseous abnormalities are seen. IMPRESSION: 1. No acute findings. 2. Stable mild cardiomegaly and Aortic Atherosclerosis (ICD10-I70.0). Electronically Signed   By: Keith Rake M.D.   On: 09/25/2018 19:28     Labs:   Basic Metabolic Panel: Recent Labs  Lab 09/25/18 1743 09/26/18 0455 09/26/18 1442 09/27/18 0512 09/28/18 0701  NA 135 134* 133* 138 138  K 2.5* 2.3* 2.8* 3.1* 3.5  CL 88* 90* 89* 94* 98  CO2 31 29 29 29 27   GLUCOSE 245* 228* 307* 332* 251*  BUN 62* 53* 46* 36* 30*  CREATININE 1.91* 1.86* 1.72* 1.63* 1.70*  CALCIUM 9.4 9.0 9.2 9.4 9.1  MG  1.5* 1.9  --   --  1.8  PHOS 3.8  --   --   --   --    GFR Estimated Creatinine Clearance: 55.8 mL/min (A) (by C-G formula based on SCr of 1.7 mg/dL (H)). Liver Function Tests: Recent Labs  Lab 09/25/18 1743  AST 18  ALT 18  ALKPHOS 84  BILITOT 0.6  PROT 8.0  ALBUMIN 3.9   No results for input(s): LIPASE, AMYLASE in the last 168 hours. No results for input(s): AMMONIA in the last 168 hours. Coagulation profile No results for input(s): INR, PROTIME in the last 168 hours.  CBC: Recent Labs  Lab 09/25/18 1743 09/26/18 0455 09/27/18 0512 09/28/18 0701  WBC 13.8* 13.2* 10.5 9.8  NEUTROABS  --  9.0*  --   --   HGB 13.6 12.6* 12.3* 12.0*  HCT 40.8 36.1* 37.4* 36.2*  MCV 87.2 84.9 86.0 86.6  PLT 332 285 278 252   Cardiac Enzymes: Recent Labs  Lab 09/25/18 1743 09/28/18 0701  TROPONINI <0.03 <0.03   BNP: Invalid input(s): POCBNP CBG: Recent Labs  Lab 09/27/18 1203 09/27/18 1619 09/27/18 2106 09/28/18 0612 09/28/18 1141  GLUCAP 370* 310* 317*  237* 299*   D-Dimer Recent Labs    09/28/18 1007  DDIMER <0.27   Hgb A1c No results for input(s): HGBA1C in the last 72 hours. Lipid Profile No results for input(s): CHOL, HDL, LDLCALC, TRIG, CHOLHDL, LDLDIRECT in the last 72 hours. Thyroid function studies No results for input(s): TSH, T4TOTAL, T3FREE, THYROIDAB in the last 72 hours.  Invalid input(s): FREET3 Anemia work up No results for input(s): VITAMINB12, FOLATE, FERRITIN, TIBC, IRON, RETICCTPCT in the last 72 hours. Microbiology No results found for this or any previous visit (from the past 240 hour(s)).   Discharge Instructions:   Discharge Instructions    (HEART FAILURE PATIENTS) Call MD:  Anytime you have any of the following symptoms: 1) 3 pound weight gain in 24 hours or 5 pounds in 1 week 2) shortness of breath, with or without a dry hacking cough 3) swelling in the hands, feet or stomach 4) if you have to sleep on extra pillows at night in order to  breathe.   Complete by:  As directed    Diet - low sodium heart healthy   Complete by:  As directed    Discharge instructions   Complete by:  As directed    Home health Weight self daily Fluid restrict to 1800 cc   Increase activity slowly   Complete by:  As directed      Allergies as of 09/28/2018      Reactions   Buprenorphine Hcl Nausea And Vomiting   Other reaction(s): Nausea And Vomiting   Hydromorphone Other (See Comments)   Other reaction(s): Mental Status Changes (intolerance) Other reaction(s): HALLUCINATIONS hallucinations hallucinations hallucinations hallucinations   Codeine Nausea And Vomiting   Mirtazapine    Other reaction(s): Other (See Comments) Nightmares & altered mental status   Morphine And Related Nausea And Vomiting      Medication List    STOP taking these medications   ibuprofen 200 MG tablet Commonly known as:  ADVIL,MOTRIN   losartan 25 MG tablet Commonly known as:  COZAAR   metFORMIN 500 MG tablet Commonly known as:  GLUCOPHAGE   metolazone 5 MG tablet Commonly known as:  ZAROXOLYN   oxyCODONE 15 mg 12 hr tablet Commonly known as:  OxyCONTIN   traMADol 50 MG tablet Commonly known as:  ULTRAM     TAKE these medications   albuterol 108 (90 Base) MCG/ACT inhaler Commonly known as:  PROVENTIL HFA;VENTOLIN HFA Inhale 2 puffs into the lungs every 4 (four) hours as needed for wheezing.   atorvastatin 40 MG tablet Commonly known as:  LIPITOR Take 40 mg by mouth every evening.   budesonide-formoterol 160-4.5 MCG/ACT inhaler Commonly known as:  SYMBICORT Inhale 2 puffs into the lungs 2 (two) times daily.   capsaicin 0.025 % cream Commonly known as:  ZOSTRIX Apply topically 2 (two) times daily.   carvedilol 6.25 MG tablet Commonly known as:  COREG Take 12.5 mg by mouth 2 (two) times daily with a meal.   donepezil 10 MG tablet Commonly known as:  ARICEPT Take 10 mg by mouth at bedtime.   esomeprazole 20 MG capsule Commonly  known as:  NEXIUM Take 20 mg by mouth every morning.   ferrous sulfate 325 (65 FE) MG tablet Take 325 mg by mouth daily with breakfast.   magnesium oxide 400 MG tablet Commonly known as:  MAG-OX Take 400 mg by mouth every morning.   memantine 5 MG tablet Commonly known as:  NAMENDA Take 5 mg by mouth 2 (  two) times daily.   montelukast 10 MG tablet Commonly known as:  SINGULAIR Take 10 mg by mouth at bedtime.   NovoLOG FlexPen 100 UNIT/ML injection Generic drug:  insulin aspart Inject 26-44 Units into the skin See admin instructions. 5 units prior to meals for cbg>= 150  Sliding scale (base is 26u in the AM, 30u at noon, and  36-44 at night   potassium chloride SA 20 MEQ tablet Commonly known as:  K-DUR,KLOR-CON Take 20 mEq by mouth 3 (three) times daily.   sertraline 50 MG tablet Commonly known as:  ZOLOFT Take 50 mg by mouth every morning.   torsemide 20 MG tablet Commonly known as:  DEMADEX Take 40 mg by mouth 2 (two) times daily.   TRESIBA FLEXTOUCH [place] Inject 65 Units into the skin 2 (two) times daily.   trospium 20 MG tablet Commonly known as:  SANCTURA Take 20 mg by mouth 2 (two) times daily.   Xalatan 0.005 % ophthalmic solution Generic drug:  latanoprost Place 1 drop into both eyes at bedtime.            Durable Medical Equipment  (From admission, onward)         Start     Ordered   09/28/18 1147  For home use only DME Walker rolling  Once    Question:  Patient needs a walker to treat with the following condition  Answer:  Weakness   09/28/18 1147         Follow-up Information    Thornton Dales I, MD Follow up in 1 week(s).   Specialty:  Family Medicine Why:  BMP 1 week Contact information: [address] [place] [address] [phone]            Time coordinating discharge: 35 min  Signed:  Geradine Girt DO  [facility] 09/28/2018, 11:47 AM

## 2018-09-28 NOTE — Progress Notes (Signed)
Pt given avs, reviewed all instructions esp fluids and restrictions, all questions entertained and answered. Pt refused walker again per OT. Pt told nurse he has a walker at the house and will use it. Again offered to get one and he refused, wife aware of discharge and bringing clothes

## 2018-09-28 NOTE — Evaluation (Signed)
Occupational Therapy Evaluation Patient Details Name: Mike Hunter MRN: [id number] DOB: 16-Jun-1951 Today's Date: 09/28/2018    History of Present Illness 68 yo admitted after outpatient lab work identified acute kidney injury. PMHx: bipolar, DM, obesity, CHF, OSA on CPAP, CAD, asthma   Clinical Impression   PTA patient reports using cane for mobility and some assist with ADLs (spouse completes IADLS).  Patient admitted for above and limited by problem list below, including impaired balance, generalized weakness, and decreased activity tolerance, as well as decreased safety awareness, impaired memory and poor problem solving.  Patient requires min guard for transfers and in room mobility with heavy reliance on RW, setup assist for UB ADLs, mod assist for LB ADLs, and min guard for toileting using urinal.  Patient continues to report plan to use cane at discharge, heavy reliance on RW during session and reiterated importance for safety/fall prevention but pt declines.  Recommending 3:1 for shower seat, educated pt on use and benefits (as he is fatigued standing at sink for grooming over 2-3 minutes) and he declines as well.  Agreeable to have spouse assist with ADLs/mobility as needed.  Patient discharging home today, will defer further OT services to [facility].     Follow Up Recommendations  Home health OT;Supervision/Assistance - 24 hour    Equipment Recommendations  3 in 1 bedside commode(RW; pt declining recommendations  )    Recommendations for Other Services       Precautions / Restrictions Precautions Precautions: Fall Restrictions Weight Bearing Restrictions: No      Mobility Bed Mobility Overal bed mobility: Modified Independent             General bed mobility comments: OOB upon entry   Transfers Overall transfer level: Needs assistance Equipment used: Rolling walker (2 wheeled) Transfers: Sit to/from Stand Sit to Stand: Min guard         General transfer comment:  min guard for safety, cueing for hand placement and attention to task     Balance Overall balance assessment: Needs assistance;History of Falls Sitting-balance support: No upper extremity supported;Feet supported Sitting balance-Leahy Scale: Fair     Standing balance support: Bilateral upper extremity supported;During functional activity Standing balance-Leahy Scale: Fair Standing balance comment: able to stand at sink with 0-1 hand support but fatigues easily, reliant on BUE supportr during mobility                            ADL either performed or assessed with clinical judgement   ADL Overall ADL's : Needs assistance/impaired     Grooming: Min guard;Standing;Wash/dry hands;Oral care   Upper Body Bathing: Set up;Sitting   Lower Body Bathing: Moderate assistance;Sit to/from stand Lower Body Bathing Details (indicate cue type and reason): reviewed safety, recommended bathing seated- pt declines Upper Body Dressing : Set up;Sitting   Lower Body Dressing: Moderate assistance;Sit to/from stand Lower Body Dressing Details (indicate cue type and reason): decreased reach to B feet Toilet Transfer: Min guard;Ambulation;RW Toilet Transfer Details (indicate cue type and reason): simulated to/from recliner  Toileting- Clothing Manipulation and Hygiene: Min guard;Sit to/from stand Toileting - Clothing Manipulation Details (indicate cue type and reason): using urinal     Functional mobility during ADLs: Min guard;Rolling walker General ADL Comments: pt with heavy reliance on RW and fatigues easily with simple grooming at sink; reviewed safety with ADLs, sitting for safety and using RW for mobility--pt verbalizes understanding but continues to report plan to use  his cane      Vision         Perception     Praxis      Pertinent Vitals/Pain Pain Assessment: No/denies pain Pain Score: 3  Pain Location: knees and back Pain Descriptors / Indicators: Aching Pain  Intervention(s): Limited activity within patient's tolerance     Hand Dominance     Extremity/Trunk Assessment Upper Extremity Assessment Upper Extremity Assessment: Overall WFL for tasks assessed   Lower Extremity Assessment Lower Extremity Assessment: Defer to PT evaluation   Cervical / Trunk Assessment Cervical / Trunk Assessment: Kyphotic   Communication Communication Communication: No difficulties   Cognition Arousal/Alertness: Awake/alert Behavior During Therapy: WFL for tasks assessed/performed(verbose) Overall Cognitive Status: No family/caregiver present to determine baseline cognitive functioning                                 General Comments: presents with decreased safety awareness, impaired short term memory and poor attention to task; unsure baseline but appears near.  Continued education with using RW for mobility and need for shower chair, pt declines.    General Comments       Exercises General Exercises - Lower Extremity Long Arc Quad: AROM;15 reps;Seated;Both Hip Flexion/Marching: AROM;15 reps;Seated;Both   Shoulder Instructions      Home Living Family/patient expects to be discharged to:: Private residence Living Arrangements: Spouse/significant other Available Help at Discharge: Family;Available 24 hours/day Type of Home: House Home Access: Stairs to enter [employer] of Steps: 3 Entrance Stairs-Rails: Right Home Layout: One level     Bathroom Shower/Tub: Teacher, early years/pre: Handicapped height     Home Equipment: Cane - single point          Prior Functioning/Environment Level of Independence: Needs assistance  Gait / Transfers Assistance Needed: uses walking stick with report of several falls this year ADL's / Homemaking Assistance Needed: spouse assists as needed, a little assist with dressing, assist with tub transfers; spouse completes cooking/cleaning/med mgmt            OT Problem  List: Decreased strength;Decreased activity tolerance;Impaired balance (sitting and/or standing);Decreased cognition;Decreased safety awareness;Decreased knowledge of use of DME or AE;Decreased knowledge of precautions;Cardiopulmonary status limiting activity      OT Treatment/Interventions:      OT Goals(Current goals can be found in the care plan section) Acute Rehab OT Goals Patient Stated Goal: home today OT Goal Formulation: With patient  OT Frequency:     Barriers to D/C:            Co-evaluation              AM-PAC OT "6 Clicks" Daily Activity     Outcome Measure Help from another person eating meals?: None Help from another person taking care of personal grooming?: A Little Help from another person toileting, which includes using toliet, bedpan, or urinal?: A Little Help from another person bathing (including washing, rinsing, drying)?: A Lot Help from another person to put on and taking off regular upper body clothing?: None Help from another person to put on and taking off regular lower body clothing?: A Lot 6 Click Score: 18   End of Session Equipment Utilized During Treatment: Rolling walker;Gait belt Nurse Communication: Mobility status;Other (comment)(DME recommendations )  Activity Tolerance: Patient tolerated treatment well Patient left: in chair;with call bell/phone within reach;with chair alarm set  OT Visit Diagnosis: Other abnormalities of gait  and mobility (R26.89);Muscle weakness (generalized) (M62.81);History of falling (Z91.81)                Time: 2903-7955 OT Time Calculation (min): 22 min Charges:  OT General Charges $OT Visit: 1 Visit OT Evaluation $OT Eval Moderate Complexity: [place], OT Acute Rehabilitation Services Pager [phone] Office [phone]   Delight Stare 09/28/2018, 2:17 PM

## 2018-09-28 NOTE — Progress Notes (Addendum)
Inpatient Diabetes Program Recommendations  AACE/ADA: New Consensus Statement on Inpatient Glycemic Control (2015)  Target Ranges:  Prepandial:   less than 140 mg/dL      Peak postprandial:   less than 180 mg/dL (1-2 hours)      Critically ill patients:  140 - 180 mg/dL  Results for Sapia, Mike Hunter (MRN [id number]) as of 09/28/2018 08:17  Ref. Range 09/27/2018 05:43 09/27/2018 12:03 09/27/2018 16:19 09/27/2018 21:06 09/28/2018 06:12  Glucose-Capillary Latest Ref Range: 70 - 99 mg/dL 288 (H) 370 (H) 310 (H) 317 (H) 237 (H)    Review of Glycemic Control  Diabetes history: DM2 Outpatient Diabetes medications: Tresiba 65 units BID, Novolog 26 units with breakfast, Novolog 30 units with lunch, Novolog 36-44 units with supper, Metformin 1000 mg BID Current orders for Inpatient glycemic control: Lantus 30 units BID, Novolog 6 units TID with meals for meal coverage, Novolog 0-9 units TID with meals, Novolog 0-5 units QHS  Inpatient Diabetes Program Recommendations:   Insulin - Basal: Please consider increasing Lantus to 36 units BID.  Thanks, Mike Alderman, RN, MSN, CDE Diabetes Coordinator Inpatient Diabetes Program [phone] (Team Pager from 8am to 5pm)

## 2018-09-28 NOTE — Progress Notes (Signed)
Patient called RN reporting CP 8/10. Chest pain protocol initiated. Patient not consistent with stating pain. One minute he states pain is a 3 the next he states it is an 8. After 3rd dose of nitro still states pain is 8/10 radiating and feels like something is sitting on his chest. Chaney Malling paged and notified and awaiting new orders. Prn oxy given to patient. Will continue to monitor patient.

## 2019-04-12 ENCOUNTER — Emergency Department (HOSPITAL_BASED_OUTPATIENT_CLINIC_OR_DEPARTMENT_OTHER): Payer: Medicare HMO

## 2019-04-12 ENCOUNTER — Other Ambulatory Visit: Payer: Self-pay

## 2019-04-12 ENCOUNTER — Emergency Department (HOSPITAL_BASED_OUTPATIENT_CLINIC_OR_DEPARTMENT_OTHER)
Admission: EM | Admit: 2019-04-12 | Discharge: 2019-04-12 | Disposition: A | Discharge status: Home / Self Care | Payer: Medicare HMO | Attending: Emergency Medicine | Admitting: Emergency Medicine

## 2019-04-12 ENCOUNTER — Encounter (HOSPITAL_BASED_OUTPATIENT_CLINIC_OR_DEPARTMENT_OTHER): Payer: Self-pay

## 2019-04-12 DIAGNOSIS — J45909 Unspecified asthma, uncomplicated: Secondary | ICD-10-CM | POA: Insufficient documentation

## 2019-04-12 DIAGNOSIS — R5383 Other fatigue: Secondary | ICD-10-CM | POA: Insufficient documentation

## 2019-04-12 DIAGNOSIS — Z20828 Contact with and (suspected) exposure to other viral communicable diseases: Secondary | ICD-10-CM | POA: Insufficient documentation

## 2019-04-12 DIAGNOSIS — Z87891 Personal history of nicotine dependence: Secondary | ICD-10-CM | POA: Diagnosis not present

## 2019-04-12 DIAGNOSIS — R600 Localized edema: Secondary | ICD-10-CM | POA: Diagnosis not present

## 2019-04-12 DIAGNOSIS — R0981 Nasal congestion: Secondary | ICD-10-CM | POA: Diagnosis not present

## 2019-04-12 DIAGNOSIS — J029 Acute pharyngitis, unspecified: Secondary | ICD-10-CM | POA: Insufficient documentation

## 2019-04-12 DIAGNOSIS — I110 Hypertensive heart disease with heart failure: Secondary | ICD-10-CM | POA: Insufficient documentation

## 2019-04-12 DIAGNOSIS — R42 Dizziness and giddiness: Secondary | ICD-10-CM | POA: Insufficient documentation

## 2019-04-12 DIAGNOSIS — R05 Cough: Secondary | ICD-10-CM

## 2019-04-12 DIAGNOSIS — R059 Cough, unspecified: Secondary | ICD-10-CM

## 2019-04-12 DIAGNOSIS — J3489 Other specified disorders of nose and nasal sinuses: Secondary | ICD-10-CM | POA: Insufficient documentation

## 2019-04-12 DIAGNOSIS — Z79899 Other long term (current) drug therapy: Secondary | ICD-10-CM | POA: Insufficient documentation

## 2019-04-12 DIAGNOSIS — E119 Type 2 diabetes mellitus without complications: Secondary | ICD-10-CM | POA: Diagnosis not present

## 2019-04-12 DIAGNOSIS — R0602 Shortness of breath: Secondary | ICD-10-CM | POA: Diagnosis not present

## 2019-04-12 DIAGNOSIS — Z794 Long term (current) use of insulin: Secondary | ICD-10-CM | POA: Insufficient documentation

## 2019-04-12 DIAGNOSIS — I5032 Chronic diastolic (congestive) heart failure: Secondary | ICD-10-CM | POA: Diagnosis not present

## 2019-04-12 DIAGNOSIS — I2510 Atherosclerotic heart disease of native coronary artery without angina pectoris: Secondary | ICD-10-CM | POA: Insufficient documentation

## 2019-04-12 DIAGNOSIS — R635 Abnormal weight gain: Secondary | ICD-10-CM | POA: Diagnosis not present

## 2019-04-12 DIAGNOSIS — I509 Heart failure, unspecified: Secondary | ICD-10-CM

## 2019-04-12 LAB — COMPREHENSIVE METABOLIC PANEL
ALT: 24 U/L (ref 0–44)
AST: 17 U/L (ref 15–41)
Albumin: 3.9 g/dL (ref 3.5–5.0)
Alkaline Phosphatase: 86 U/L (ref 38–126)
Anion gap: 9 (ref 5–15)
BUN: 27 mg/dL — ABNORMAL HIGH (ref 8–23)
CO2: 25 mmol/L (ref 22–32)
Calcium: 9.1 mg/dL (ref 8.9–10.3)
Chloride: 104 mmol/L (ref 98–111)
Creatinine, Ser: 1.35 mg/dL — ABNORMAL HIGH (ref 0.61–1.24)
GFR calc Af Amer: >60 mL/min (ref >60)
GFR calc non Af Amer: 53 mL/min — ABNORMAL LOW (ref >60)
Glucose, Bld: 109 mg/dL — ABNORMAL HIGH (ref 70–99)
Potassium: 4.1 mmol/L (ref 3.5–5.1)
Sodium: 138 mmol/L (ref 135–145)
Total Bilirubin: 0.8 mg/dL (ref 0.3–1.2)
Total Protein: 7.3 g/dL (ref 6.5–8.1)

## 2019-04-12 LAB — CBC WITH DIFFERENTIAL/PLATELET
Abs Immature Granulocytes: 0.07 10*3/uL (ref 0.00–0.07)
Basophils Absolute: 0.1 10*3/uL (ref 0.0–0.1)
Basophils Relative: 1 %
Eosinophils Absolute: 0.6 10*3/uL — ABNORMAL HIGH (ref 0.0–0.5)
Eosinophils Relative: 5 %
HCT: 40.7 % (ref 39.0–52.0)
Hemoglobin: 13.2 g/dL (ref 13.0–17.0)
Immature Granulocytes: 1 %
Lymphocytes Relative: 19 %
Lymphs Abs: 2.3 10*3/uL (ref 0.7–4.0)
MCH: 29.3 pg (ref 26.0–34.0)
MCHC: 32.4 g/dL (ref 30.0–36.0)
MCV: 90.2 fL (ref 80.0–100.0)
Monocytes Absolute: 0.9 10*3/uL (ref 0.1–1.0)
Monocytes Relative: 7 %
Neutro Abs: 8.4 10*3/uL — ABNORMAL HIGH (ref 1.7–7.7)
Neutrophils Relative %: 67 %
Platelets: 288 10*3/uL (ref 150–400)
RBC: 4.51 MIL/uL (ref 4.22–5.81)
RDW: 13.4 % (ref 11.5–15.5)
WBC: 12.3 10*3/uL — ABNORMAL HIGH (ref 4.0–10.5)
nRBC: 0 % (ref 0.0–0.2)

## 2019-04-12 LAB — BRAIN NATRIURETIC PEPTIDE: B Natriuretic Peptide: 60.5 pg/mL (ref 0.0–100.0)

## 2019-04-12 MED ORDER — FUROSEMIDE 10 MG/ML IJ SOLN
80.0000 mg | Freq: Once | INTRAMUSCULAR | Status: AC
  Administered 2019-04-12: 21:00:00 80 mg via INTRAVENOUS
  Filled 2019-04-12: qty 8

## 2019-04-12 NOTE — ED Provider Notes (Signed)
[facility] EMERGENCY DEPARTMENT Provider Note   CSN: [id number] Arrival date & time: 04/12/19  1716     History   Chief Complaint Chief Complaint  Patient presents with   Cough    HPI Mike Hunter is a 68 y.o. male.     HPI   CHF supposed to be on fluid restriction but did not restric much while on vacation to [employer] last week.  Has been taking the torsemide as prescribed twice daily 40mg .  Gained weight, has not been checking daily, but is up about 6lb more than normal.   Cough, feels fluid coming up, feels like it is strangling at times. Starts coughing and can't get breath. Works hard to get breath back. Gets worse at nighttime. Worse when laying down at night. Sleeping on 3 pillows, uses CPAP.  Cough and dyspnea worsening over last few weeks, he thinks it has been a month but wife thinks less Increase in leg swelling, arms swelling.   No fevers, no chest pain.    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   High cholesterol    History of methicillin resistant staphylococcus aureus (MRSA)    Hypertension    Reflux    Sleep apnea     Patient Active Problem List   Diagnosis Date Noted   Hypokalemia 09/26/2018   Hypomagnesemia 09/26/2018   Chronic diastolic CHF (congestive heart failure) ([place]) 09/26/2018   CAD (coronary artery disease) 09/26/2018   Asthma 09/26/2018   Depression with anxiety 09/26/2018   Asthma with status asthmaticus    AKI (acute kidney injury) ([place]) 09/25/2018   Type II or unspecified type diabetes mellitus with neurological manifestations, not stated as uncontrolled(250.60) 02/09/2013   Unspecified urinary incontinence 02/09/2013   Esophageal reflux 02/09/2013   Insulin-requiring or dependent type II  diabetes mellitus ([place]) 12/30/2012   Pyogenic arthritis of knee ([place]) 12/30/2012    Past Surgical History:  Procedure Laterality Date   CATARACT EXTRACTION     CERVICAL FUSION     CHOLECYSTECTOMY     KIDNEY STONE SURGERY     KNEE ARTHROSCOPY          Home Medications    Prior to Admission medications   Medication Sig Start Date End Date Taking? Authorizing Provider  albuterol (PROVENTIL HFA;VENTOLIN HFA) 108 (90 BASE) MCG/ACT inhaler Inhale 2 puffs into the lungs every 4 (four) hours as needed for wheezing.    [provider]  atorvastatin (LIPITOR) 40 MG tablet Take 40 mg by mouth every evening.     [provider]  budesonide-formoterol (SYMBICORT) 160-4.5 MCG/ACT inhaler Inhale 2 puffs into the lungs 2 (two) times daily. 07/03/16   [provider]  capsaicin (ZOSTRIX) 0.025 % cream Apply topically 2 (two) times daily. 09/28/18   Geradine Girt, DO  carvedilol (COREG) 6.25 MG tablet Take 12.5 mg by mouth 2 (two) times daily with a meal.     [provider]  donepezil (ARICEPT) 10 MG tablet Take 10 mg by mouth at bedtime.    [provider]  esomeprazole (NEXIUM) 20 MG capsule Take 20 mg by mouth every morning.    [provider]  ferrous sulfate 325 (65 FE) MG tablet  Take 325 mg by mouth daily with breakfast.    [provider]  insulin aspart (NOVOLOG FLEXPEN) 100 UNIT/ML injection Inject 26-44 Units into the skin See admin instructions. 5 units prior to meals for cbg>= 150  Sliding scale (base is 26u in the AM, 30u at noon, and  36-44 at night    [provider]  Insulin Degludec (TRESIBA FLEXTOUCH [place]) Inject 65 Units into the skin 2 (two) times daily.    [provider]  latanoprost (XALATAN) 0.005 % ophthalmic solution Place 1 drop into both eyes at bedtime.      [provider]  magnesium oxide (MAG-OX) 400 MG tablet Take 400 mg by mouth every morning. 09/24/18   [provider]  memantine (NAMENDA) 5 MG tablet Take 5 mg by mouth 2 (two) times daily.    [provider]  montelukast (SINGULAIR) 10 MG tablet Take 10 mg by mouth at bedtime.    [provider]  potassium chloride SA (K-DUR,KLOR-CON) 20 MEQ tablet Take 20 mEq by mouth 3 (three) times daily. 09/24/18   [provider]  sertraline (ZOLOFT) 50 MG tablet Take 50 mg by mouth every morning.     [provider]  torsemide (DEMADEX) 20 MG tablet Take 40 mg by mouth 2 (two) times daily.    [provider]  trospium (SANCTURA) 20 MG tablet Take 20 mg by mouth 2 (two) times daily. 09/09/18 09/09/19  [provider]    Family History No family history on file.  Social History Social History   Tobacco Use   Smoking status: Former Smoker   Smokeless tobacco: Never Used  Substance Use Topics   Alcohol use: No   Drug use: No     Allergies   Buprenorphine hcl, Hydromorphone, Codeine, Mirtazapine, and Morphine and related   Review of Systems Review of Systems  Constitutional: Positive for fatigue. Negative for appetite change and fever.  HENT: Positive for congestion, rhinorrhea and sore throat.   Respiratory: Positive for cough and shortness of breath.   Cardiovascular: Negative for chest pain.  Gastrointestinal: Negative for abdominal pain, nausea and vomiting.  Genitourinary: Negative for dysuria.  Skin: Negative for rash.  Neurological: Positive for light-headedness. Negative for headaches.     Physical Exam Updated Vital Signs BP (!) 177/110    Pulse 87    Temp 99 F (37.2 C) (Oral)    Resp (!) 23    Ht 5\' 7"  (1.702 m)    Wt 135.6 kg    SpO2 95%    BMI 46.83 kg/m   Physical Exam Vitals signs and nursing note reviewed.  Constitutional:      General: He is not in acute distress.    Appearance: He is well-developed. He is not diaphoretic.  HENT:     Head: Normocephalic and atraumatic.  Eyes:     Conjunctiva/sclera: Conjunctivae  normal.  Neck:     Musculoskeletal: Normal range of motion.  Cardiovascular:     Rate and Rhythm: Normal rate and regular rhythm.     Heart sounds: Normal heart sounds. No murmur. No friction rub. No gallop.   Pulmonary:     Effort: Pulmonary effort is normal. No respiratory distress.     Breath sounds: Normal breath sounds. No wheezing or rales.  Abdominal:     General: There is no distension.     Palpations: Abdomen is soft.     Tenderness: There is no abdominal tenderness. There is no guarding.  Musculoskeletal:     Right lower leg: Edema present.     Left lower leg: Edema present.  Skin:    General: Skin is warm and dry.  Neurological:     Mental Status: He is alert and oriented to person, place, and time.      ED Treatments / Results  Labs (all labs ordered are listed, but only abnormal results are displayed) Labs Reviewed  CBC WITH DIFFERENTIAL/PLATELET - Abnormal; Notable for the following components:      Result Value   WBC 12.3 (*)    Neutro Abs 8.4 (*)    Eosinophils Absolute 0.6 (*)    All other components within normal limits  COMPREHENSIVE METABOLIC PANEL - Abnormal; Notable for the following components:   Glucose, Bld 109 (*)    BUN 27 (*)    Creatinine, Ser 1.35 (*)    GFR calc non Af Amer 53 (*)    All other components within normal limits  NOVEL CORONAVIRUS, NAA (HOSP ORDER, SEND-OUT TO REF LAB; TAT 18-24 HRS)  BRAIN NATRIURETIC PEPTIDE    EKG EKG Interpretation  Date/Time:  Tuesday April 12 2019 20:34:52 EDT Ventricular Rate:  71 PR Interval:    QRS Duration: 97 QT Interval:  387 QTC Calculation: 421 R Axis:   60 Text Interpretation: Sinus rhythm Nonspecific T abnormalities, lateral leads Baseline wander in lead(s) V5 No significant change since last tracing Confirmed by Gareth Morgan [phone]) on 04/12/2019 9:38:19 PM   Radiology Dg Chest Portable 1 View  Result Date: 04/12/2019 CLINICAL DATA:  Cough and flu like symptoms for the past  month. EXAM: PORTABLE CHEST 1 VIEW COMPARISON:  CT chest dated November 29, 2018. Chest x-ray dated Nov 09, 2018. FINDINGS: The heart size and mediastinal contours are within normal limits. Both lungs are clear. The visualized skeletal structures are unremarkable. IMPRESSION: No active disease. Electronically Signed   By: Titus Dubin M.D.   On: 04/12/2019 19:29    Procedures Procedures (including critical care time)  Medications Ordered in ED Medications  furosemide (LASIX) injection 80 mg (80 mg Intravenous Given 04/12/19 2101)     Initial Impression / Assessment and Plan / ED Course  I have reviewed the triage vital signs and the nursing notes.  Pertinent labs & imaging results that were available during my care of the patient were reviewed by me and considered in my medical decision making (see chart for details).        68yo male with history of CHF, DM, htn, hlpd, COPD, presents with concern for cough, shortness of breath increasing over the last few weeks.  Reports weight gain of 6lb, lack of fluid restriction,increasing leg swelling. Hx and exam consistent with CHF exacerbation Suspect BNP underestimating and will give lasix given clinical concern.  No signs of pneumonia, hx does not appear consistent with COPD exacerbation, no wheezing on exam although rec continued prn albuterol use.  COVID testing ordered and pending. Recommend quarantine as they await results.  Good UOP after lasix, reports he feels improved. Normal oxygenation, no tachypnea. Recommend PCP and Cardiology follow up.  Final Clinical Impressions(s) / ED Diagnoses   Final diagnoses:  Cough  Congestive heart failure, unspecified HF chronicity, unspecified heart failure type [facility])    ED Discharge Orders    None       Gareth Morgan, MD 04/14/19 1344

## 2019-04-12 NOTE — ED Triage Notes (Signed)
Pt c/o flu like sx x 1 month-states she has not been tested for covid and denies known covid exposure-recent trip to [facility] gait with own cane

## 2019-05-03 LAB — NOVEL CORONAVIRUS, NAA (HOSP ORDER, SEND-OUT TO REF LAB; TAT 18-24 HRS): SARS-CoV-2, NAA: NOT DETECTED

## 2019-08-10 ENCOUNTER — Encounter (HOSPITAL_BASED_OUTPATIENT_CLINIC_OR_DEPARTMENT_OTHER): Payer: Self-pay | Admitting: Emergency Medicine

## 2019-08-10 ENCOUNTER — Other Ambulatory Visit: Payer: Self-pay

## 2019-08-10 ENCOUNTER — Emergency Department (HOSPITAL_BASED_OUTPATIENT_CLINIC_OR_DEPARTMENT_OTHER)
Admission: EM | Admit: 2019-08-10 | Discharge: 2019-08-10 | Disposition: A | Discharge status: Home / Self Care | Payer: Medicare HMO | Attending: Emergency Medicine | Admitting: Emergency Medicine

## 2019-08-10 ENCOUNTER — Emergency Department (HOSPITAL_BASED_OUTPATIENT_CLINIC_OR_DEPARTMENT_OTHER): Payer: Medicare HMO

## 2019-08-10 DIAGNOSIS — I5032 Chronic diastolic (congestive) heart failure: Secondary | ICD-10-CM | POA: Diagnosis not present

## 2019-08-10 DIAGNOSIS — M7989 Other specified soft tissue disorders: Secondary | ICD-10-CM

## 2019-08-10 DIAGNOSIS — R2242 Localized swelling, mass and lump, left lower limb: Secondary | ICD-10-CM | POA: Insufficient documentation

## 2019-08-10 DIAGNOSIS — Z79899 Other long term (current) drug therapy: Secondary | ICD-10-CM | POA: Diagnosis not present

## 2019-08-10 DIAGNOSIS — Z794 Long term (current) use of insulin: Secondary | ICD-10-CM | POA: Diagnosis not present

## 2019-08-10 DIAGNOSIS — I110 Hypertensive heart disease with heart failure: Secondary | ICD-10-CM | POA: Diagnosis not present

## 2019-08-10 DIAGNOSIS — E1149 Type 2 diabetes mellitus with other diabetic neurological complication: Secondary | ICD-10-CM | POA: Insufficient documentation

## 2019-08-10 NOTE — ED Notes (Signed)
Pt transported to Ultrasound.  

## 2019-08-10 NOTE — ED Provider Notes (Signed)
[facility] EMERGENCY DEPARTMENT Provider Note   CSN: [id number] Arrival date & time: 08/10/19  1024     History Chief Complaint  Patient presents with  . Leg Swelling    Mike Hunter is a 69 y.o. male.  69 yo M with a chief complaint of left lower extremity pain and swelling.  This been going on for a few days.  Denies trauma.  Feels it starts with his groin and goes down the leg.  He has had this happen multiple times in the past and is unsure of the etiology.  He is concerned for a blood clot in the leg, states that his mother had one in the past.  He describes a issue where his leg turned blue at some point.  States that resolved spontaneously.  He had called his family doctor who suggested he come to the ED for evaluation.  The history is provided by the patient.  Illness Severity:  Moderate Onset quality:  Gradual Duration:  2 days Timing:  Constant Progression:  Worsening Chronicity:  Recurrent Associated symptoms: no abdominal pain, no chest pain, no congestion, no diarrhea, no fever, no headaches, no myalgias, no rash, no shortness of breath and no vomiting        Past Medical History:  Diagnosis Date  . Asthma   . Bipolar affective ([place])   . Cataract   . CHF (congestive heart failure) ([place])   . Conversion disorder   . Cornea disorder   . Diabetes mellitus   . 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 High cholesterol   . History of methicillin resistant staphylococcus aureus (MRSA)   . Hypertension   . Reflux   . Sleep apnea     Patient Active Problem List   Diagnosis Date Noted  . Hypokalemia 09/26/2018  . Hypomagnesemia 09/26/2018  . Chronic diastolic CHF (congestive heart failure) ([place]) 09/26/2018  . CAD (coronary artery disease) 09/26/2018  . Asthma 09/26/2018  . Depression with anxiety 09/26/2018  . Asthma with status asthmaticus   . AKI (acute kidney injury) ([place]) 09/25/2018  . Type II or unspecified type diabetes mellitus with  neurological manifestations, not stated as uncontrolled(250.60) 02/09/2013  . Unspecified urinary incontinence 02/09/2013  . Esophageal reflux 02/09/2013  . Insulin-requiring or dependent type II diabetes mellitus ([place]) 12/30/2012  . Pyogenic arthritis of knee ([place]) 12/30/2012    Past Surgical History:  Procedure Laterality Date  . CATARACT EXTRACTION    . CERVICAL FUSION    . CHOLECYSTECTOMY    . KIDNEY STONE SURGERY    . KNEE ARTHROSCOPY         No family history on file.  Social History   Tobacco Use  . Smoking status: Former Research scientist (life sciences)  . Smokeless tobacco: Never Used  Substance Use Topics  . Alcohol use: No  . Drug use: No    Home Medications Prior to Admission medications   Medication Sig Start Date End Date Taking? Authorizing Provider  albuterol (PROVENTIL HFA;VENTOLIN HFA) 108 (90 BASE) MCG/ACT inhaler Inhale 2 puffs into the lungs every 4 (four) hours as needed for wheezing.    [provider]  atorvastatin (LIPITOR) 40 MG tablet Take 40 mg by mouth every evening.     [provider]  budesonide-formoterol (SYMBICORT) 160-4.5 MCG/ACT inhaler Inhale 2 puffs into the lungs 2 (two) times daily. 07/03/16   [provider]  carvedilol (COREG) 6.25 MG tablet Take 12.5 mg by mouth 2 (two) times daily  with a meal.     [provider]  donepezil (ARICEPT) 10 MG tablet Take 10 mg by mouth at bedtime.    [provider]  insulin aspart (NOVOLOG FLEXPEN) 100 UNIT/ML injection Inject 26-44 Units into the skin See admin instructions. 5 units prior to meals for cbg>= 150  Sliding scale (base is 26u in the AM, 30u at noon, and  36-44 at night    [provider]  Insulin Degludec (TRESIBA FLEXTOUCH [place]) Inject 65 Units into the skin 2 (two) times daily.    [provider]  latanoprost (XALATAN) 0.005 % ophthalmic solution Place 1 drop into both eyes at bedtime.      [provider]  memantine (NAMENDA) 5 MG tablet  Take 5 mg by mouth 2 (two) times daily.    [provider]  montelukast (SINGULAIR) 10 MG tablet Take 10 mg by mouth at bedtime.    [provider]  potassium chloride SA (K-DUR,KLOR-CON) 20 MEQ tablet Take 20 mEq by mouth 3 (three) times daily. 09/24/18   [provider]  sertraline (ZOLOFT) 50 MG tablet Take 50 mg by mouth every morning.     [provider]  torsemide (DEMADEX) 20 MG tablet Take 40 mg by mouth 2 (two) times daily.    [provider]  trospium (SANCTURA) 20 MG tablet Take 20 mg by mouth 2 (two) times daily. 09/09/18 09/09/19  [provider]    Allergies    Buprenorphine hcl, Hydromorphone, Codeine, Mirtazapine, and Morphine and related  Review of Systems   Review of Systems  Constitutional: Negative for chills and fever.  HENT: Negative for congestion and facial swelling.   Eyes: Negative for discharge and visual disturbance.  Respiratory: Negative for shortness of breath.   Cardiovascular: Positive for leg swelling. Negative for chest pain and palpitations.  Gastrointestinal: Negative for abdominal pain, diarrhea and vomiting.  Musculoskeletal: Negative for arthralgias and myalgias.  Skin: Negative for color change and rash.  Neurological: Negative for tremors, syncope and headaches.  Psychiatric/Behavioral: Negative for confusion and dysphoric mood.    Physical Exam Updated Vital Signs BP (!) 143/84 (BP Location: Right Arm)   Pulse 81   Temp 98.6 F (37 C) (Oral)   Resp 20   Ht 5\' 11"  (1.803 m)   Wt (!) 138.3 kg   SpO2 98%   BMI 42.54 kg/m   Physical Exam Vitals and nursing note reviewed.  Constitutional:      Appearance: He is well-developed.  HENT:     Head: Normocephalic and atraumatic.  Eyes:     Pupils: Pupils are equal, round, and reactive to light.  Neck:     Vascular: No JVD.  Cardiovascular:     Rate and Rhythm: Normal rate and regular rhythm.     Heart sounds: No murmur. No friction  rub. No gallop.   Pulmonary:     Effort: No respiratory distress.     Breath sounds: No wheezing.  Abdominal:     General: There is no distension.     Tenderness: There is no abdominal tenderness. There is no guarding or rebound.  Musculoskeletal:        General: Normal range of motion.     Cervical back: Normal range of motion and neck supple.     Comments: I do not appreciate any difference between the left and the right lower extremity.  Patient has intact posterior tibialis pulses.  Intact sensation and motor.  No discoloration of  the leg.  He does have multiple animal scratches about bilateral lower extremities.  There is no erythema or warmth.  Skin:    Coloration: Skin is not pale.     Findings: No rash.  Neurological:     Mental Status: He is alert and oriented to person, place, and time.  Psychiatric:        Behavior: Behavior normal.     ED Results / Procedures / Treatments   Labs (all labs ordered are listed, but only abnormal results are displayed) Labs Reviewed - No data to display  EKG None  Radiology US Venous Img Lower Unilateral Left  Result Date: 08/10/2019 CLINICAL DATA:  Lower extremity edema EXAM: LEFT LOWER EXTREMITY VENOUS DUPLEX ULTRASOUND TECHNIQUE: Gray-scale sonography with graded compression, as well as color Doppler and duplex ultrasound were performed to evaluate the left lower extremity deep venous system from the level of the common femoral vein and including the common femoral, femoral, profunda femoral, popliteal and calf veins including the posterior tibial, peroneal and gastrocnemius veins when visible. The superficial great saphenous vein was also interrogated. Spectral Doppler was utilized to evaluate flow at rest and with distal augmentation maneuvers in the common femoral, femoral and popliteal veins. COMPARISON:  None. FINDINGS: Contralateral Common Femoral Vein: Respiratory phasicity is normal and symmetric with the symptomatic side. No  evidence of thrombus. Normal compressibility. Common Femoral Vein: No evidence of thrombus. Normal compressibility, respiratory phasicity and response to augmentation. Saphenofemoral Junction: No evidence of thrombus. Normal compressibility and flow on color Doppler imaging. Profunda Femoral Vein: No evidence of thrombus. Normal compressibility and flow on color Doppler imaging. Femoral Vein: No evidence of thrombus. Normal compressibility, respiratory phasicity and response to augmentation. Popliteal Vein: No evidence of thrombus. Normal compressibility, respiratory phasicity and response to augmentation. Calf Veins: No evidence of thrombus. Normal compressibility and flow on color Doppler imaging. Superficial Great Saphenous Vein: No evidence of thrombus. Normal compressibility. Venous Reflux:  None. Other Findings:  None. IMPRESSION: No evidence of deep venous thrombosis in the left lower extremity. Right common femoral vein also patent. Electronically Signed   By: Lowella Grip III M.D.   On: 08/10/2019 11:53    Procedures Procedures (including critical care time)  Medications Ordered in ED Medications - No data to display  ED Course  I have reviewed the triage vital signs and the nursing notes.  Pertinent labs & imaging results that were available during my care of the patient were reviewed by me and considered in my medical decision making (see chart for details).    MDM Rules/Calculators/A&P                      69 yo M with a chief complaints of left lower extremity edema.  I do not appreciate this on exam.  This is apparently occurred to him multiple times in the past he has had prior DVT ultrasounds have been negative.  I am unsure of the cause of his symptoms, he has no obvious issue with his leg on exam.  Will obtain a DVT study.  DVT study negative. D/c home.  PCP follow up.   11:58 AM:  I have discussed the diagnosis/risks/treatment options with the patient and believe the pt  to be eligible for discharge home to follow-up with PCP. We also discussed returning to the ED immediately if new or worsening sx occur. We discussed the sx which are most concerning (e.g., sudden worsening pain, fever, inability to tolerate  by mouth) that necessitate immediate return. Medications administered to the patient during their visit and any new prescriptions provided to the patient are listed below.  Medications given during this visit Medications - No data to display   The patient appears reasonably screen and/or stabilized for discharge and I doubt any other medical condition or other [facility] requiring further screening, evaluation, or treatment in the ED at this time prior to discharge.   Final Clinical Impression(s) / ED Diagnoses Final diagnoses:  Left leg swelling    Rx / DC Orders ED Discharge Orders    None       Deno Etienne, DO 08/10/19 1158

## 2019-08-10 NOTE — ED Notes (Signed)
ED Provider at bedside. 

## 2019-08-10 NOTE — ED Triage Notes (Signed)
Swelling and discoloration of left leg x4 days. Condition is chronic.  Elevation helps but it comes back.

## 2019-08-10 NOTE — Discharge Instructions (Signed)
Follow up with your doc.  Return for worsening symptoms.

## 2019-09-13 ENCOUNTER — Emergency Department (HOSPITAL_BASED_OUTPATIENT_CLINIC_OR_DEPARTMENT_OTHER): Payer: Medicare HMO

## 2019-09-13 ENCOUNTER — Encounter (HOSPITAL_BASED_OUTPATIENT_CLINIC_OR_DEPARTMENT_OTHER): Payer: Self-pay | Admitting: Emergency Medicine

## 2019-09-13 ENCOUNTER — Emergency Department (HOSPITAL_BASED_OUTPATIENT_CLINIC_OR_DEPARTMENT_OTHER)
Admission: EM | Admit: 2019-09-13 | Discharge: 2019-09-13 | Disposition: A | Discharge status: Home / Self Care | Payer: Medicare HMO | Attending: Emergency Medicine | Admitting: Emergency Medicine

## 2019-09-13 ENCOUNTER — Other Ambulatory Visit: Payer: Self-pay

## 2019-09-13 DIAGNOSIS — M25551 Pain in right hip: Secondary | ICD-10-CM | POA: Insufficient documentation

## 2019-09-13 DIAGNOSIS — Y999 Unspecified external cause status: Secondary | ICD-10-CM | POA: Diagnosis not present

## 2019-09-13 DIAGNOSIS — W06XXXA Fall from bed, initial encounter: Secondary | ICD-10-CM | POA: Insufficient documentation

## 2019-09-13 DIAGNOSIS — M79661 Pain in right lower leg: Secondary | ICD-10-CM | POA: Diagnosis not present

## 2019-09-13 DIAGNOSIS — M25511 Pain in right shoulder: Secondary | ICD-10-CM | POA: Diagnosis not present

## 2019-09-13 DIAGNOSIS — R42 Dizziness and giddiness: Secondary | ICD-10-CM | POA: Diagnosis not present

## 2019-09-13 DIAGNOSIS — I2510 Atherosclerotic heart disease of native coronary artery without angina pectoris: Secondary | ICD-10-CM | POA: Insufficient documentation

## 2019-09-13 DIAGNOSIS — S0990XA Unspecified injury of head, initial encounter: Secondary | ICD-10-CM | POA: Diagnosis present

## 2019-09-13 DIAGNOSIS — W19XXXA Unspecified fall, initial encounter: Secondary | ICD-10-CM

## 2019-09-13 DIAGNOSIS — Y9389 Activity, other specified: Secondary | ICD-10-CM | POA: Diagnosis not present

## 2019-09-13 DIAGNOSIS — Z79899 Other long term (current) drug therapy: Secondary | ICD-10-CM | POA: Insufficient documentation

## 2019-09-13 DIAGNOSIS — M79604 Pain in right leg: Secondary | ICD-10-CM

## 2019-09-13 DIAGNOSIS — R072 Precordial pain: Secondary | ICD-10-CM

## 2019-09-13 DIAGNOSIS — M79601 Pain in right arm: Secondary | ICD-10-CM

## 2019-09-13 DIAGNOSIS — Z794 Long term (current) use of insulin: Secondary | ICD-10-CM | POA: Insufficient documentation

## 2019-09-13 DIAGNOSIS — M25571 Pain in right ankle and joints of right foot: Secondary | ICD-10-CM | POA: Diagnosis not present

## 2019-09-13 DIAGNOSIS — Y929 Unspecified place or not applicable: Secondary | ICD-10-CM | POA: Insufficient documentation

## 2019-09-13 DIAGNOSIS — I110 Hypertensive heart disease with heart failure: Secondary | ICD-10-CM | POA: Insufficient documentation

## 2019-09-13 DIAGNOSIS — M25531 Pain in right wrist: Secondary | ICD-10-CM | POA: Diagnosis not present

## 2019-09-13 DIAGNOSIS — I5032 Chronic diastolic (congestive) heart failure: Secondary | ICD-10-CM | POA: Insufficient documentation

## 2019-09-13 DIAGNOSIS — R0789 Other chest pain: Secondary | ICD-10-CM | POA: Diagnosis not present

## 2019-09-13 DIAGNOSIS — E119 Type 2 diabetes mellitus without complications: Secondary | ICD-10-CM | POA: Insufficient documentation

## 2019-09-13 HISTORY — DX: Disorder of kidney and ureter, unspecified: N28.9

## 2019-09-13 LAB — CBC WITH DIFFERENTIAL/PLATELET
Abs Immature Granulocytes: 0.05 10*3/uL (ref 0.00–0.07)
Basophils Absolute: 0.1 10*3/uL (ref 0.0–0.1)
Basophils Relative: 1 %
Eosinophils Absolute: 0.5 10*3/uL (ref 0.0–0.5)
Eosinophils Relative: 5 %
HCT: 42.3 % (ref 39.0–52.0)
Hemoglobin: 13.6 g/dL (ref 13.0–17.0)
Immature Granulocytes: 1 %
Lymphocytes Relative: 18 %
Lymphs Abs: 1.7 10*3/uL (ref 0.7–4.0)
MCH: 29.1 pg (ref 26.0–34.0)
MCHC: 32.2 g/dL (ref 30.0–36.0)
MCV: 90.6 fL (ref 80.0–100.0)
Monocytes Absolute: 0.7 10*3/uL (ref 0.1–1.0)
Monocytes Relative: 7 %
Neutro Abs: 6.6 10*3/uL (ref 1.7–7.7)
Neutrophils Relative %: 68 %
Platelets: 299 10*3/uL (ref 150–400)
RBC: 4.67 MIL/uL (ref 4.22–5.81)
RDW: 13.6 % (ref 11.5–15.5)
WBC: 9.6 10*3/uL (ref 4.0–10.5)
nRBC: 0 % (ref 0.0–0.2)

## 2019-09-13 LAB — URINALYSIS, ROUTINE W REFLEX MICROSCOPIC
Bilirubin Urine: NEGATIVE
Glucose, UA: >=500 mg/dL — AB
Hgb urine dipstick: NEGATIVE
Ketones, ur: NEGATIVE mg/dL
Nitrite: NEGATIVE
Protein, ur: NEGATIVE mg/dL
Specific Gravity, Urine: 1.02 (ref 1.005–1.030)
pH: 5.5 (ref 5.0–8.0)

## 2019-09-13 LAB — BASIC METABOLIC PANEL
Anion gap: 9 (ref 5–15)
BUN: 28 mg/dL — ABNORMAL HIGH (ref 8–23)
CO2: 26 mmol/L (ref 22–32)
Calcium: 9 mg/dL (ref 8.9–10.3)
Chloride: 104 mmol/L (ref 98–111)
Creatinine, Ser: 1.53 mg/dL — ABNORMAL HIGH (ref 0.61–1.24)
GFR calc Af Amer: 53 mL/min — ABNORMAL LOW (ref >60)
GFR calc non Af Amer: 45 mL/min — ABNORMAL LOW (ref >60)
Glucose, Bld: 161 mg/dL — ABNORMAL HIGH (ref 70–99)
Potassium: 3.8 mmol/L (ref 3.5–5.1)
Sodium: 139 mmol/L (ref 135–145)

## 2019-09-13 LAB — URINALYSIS, MICROSCOPIC (REFLEX): RBC / HPF: NONE SEEN RBC/hpf (ref 0–5)

## 2019-09-13 LAB — TROPONIN I (HIGH SENSITIVITY): Troponin I (High Sensitivity): 6 ng/L (ref <18)

## 2019-09-13 MED ORDER — DICLOFENAC SODIUM 1 % EX GEL: 2.0000 g | Freq: Four times a day (QID) | CUTANEOUS | 0 refills | Status: DC | PRN

## 2019-09-13 NOTE — ED Triage Notes (Signed)
Pt reports fall out of bed yesterday morning. Pt c/o right  ide pain of head, shoulder, leg and ankle. Pt denies loc, reports being wedged between bed and dresser. Pt also reports mild dizziness today. Pt denies blood thinners. AO x 4

## 2019-09-13 NOTE — Discharge Instructions (Addendum)
You were seen today for a fall. Your xrays show arthritis with no fractures.  Head CT did not show a bleed. Use Voltaren as needed. Follow up with Sports Med in two days. Follow up with PCP as well. Return to ED with worsening chest pain, diaphoresis, trouble breathing, cyanosis, shortness of breath, worsening HA, weakness.

## 2019-09-13 NOTE — ED Provider Notes (Signed)
[facility] EMERGENCY DEPARTMENT Provider Note   CSN: [id number] Arrival date & time: 09/13/19  1334     History Chief Complaint  Patient presents with  . Fall    Mike Hunter is a 69 y.o. male.  Pt fell off bed yesterday AM at 6:30  due to a nightmare.He fell about 2 feet on to carpeted floor. He hit his right side of his head, wrist, shoulder, hip, knee, and ankle.  No seizure like activity, no LOC. He denies any previous injures to his head or other areas. He denies alcohol or any blood thinners. He is also admitting to some chest pain that started yesterday. He describes it in the center of his chest, sharp, pleuritic, non radiating. He mentioned that it feels as if a 300 pound elephant is sitting on his chest. He denies any past known MI. He denies any new SOB, diaphoresis, weakness, jaw/arm pain. He denies any new back or neck pain. He is able to ambulate normally at home with his walker, he was able to ambulate into the ER today with his walker. He admits to some dizziness when he walks - he states that he feels off balance, which is new for him. He denies any HA, new vision problems.  Yesterday after his fall he states that he remained in bed most of the day and took NSAIDS for the pain which provided minimal relief.   The history is provided by the patient and the spouse.  Fall Associated symptoms include chest pain. Pertinent negatives include no shortness of breath.       Past Medical History:  Diagnosis Date  . Asthma   . Bipolar affective ([place])   . Cataract   . CHF (congestive heart failure) ([place])   . Conversion disorder   . Cornea disorder   . Diabetes mellitus   . 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 High cholesterol   . History of methicillin resistant staphylococcus aureus (MRSA)   . Hypertension   . Reflux   . Renal disorder   . Sleep apnea     Patient Active Problem List   Diagnosis Date Noted  . Hypokalemia 09/26/2018  .  Hypomagnesemia 09/26/2018  . Chronic diastolic CHF (congestive heart failure) ([place]) 09/26/2018  . CAD (coronary artery disease) 09/26/2018  . Asthma 09/26/2018  . Depression with anxiety 09/26/2018  . Asthma with status asthmaticus   . AKI (acute kidney injury) ([place]) 09/25/2018  . Type II or unspecified type diabetes mellitus with neurological manifestations, not stated as uncontrolled(250.60) 02/09/2013  . Unspecified urinary incontinence 02/09/2013  . Esophageal reflux 02/09/2013  . 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 Pyogenic arthritis of knee ([place]) 12/30/2012    Past Surgical History:  Procedure Laterality Date  . CATARACT EXTRACTION    . CERVICAL FUSION    . CHOLECYSTECTOMY    . KIDNEY STONE SURGERY    . KNEE ARTHROSCOPY         History reviewed. No pertinent family history.  Social History   Tobacco Use  . Smoking status: Former Research scientist (life sciences)  . Smokeless tobacco: Never Used  Substance Use Topics  . Alcohol use: No  . Drug use: No    Home Medications Prior to Admission medications   Medication Sig Start Date End Date Taking? Authorizing Provider  albuterol (PROVENTIL HFA;VENTOLIN HFA) 108 (90 BASE) MCG/ACT inhaler Inhale 2 puffs into the lungs every 4 (four) hours as needed for  wheezing.    [provider]  atorvastatin (LIPITOR) 40 MG tablet Take 40 mg by mouth every evening.     [provider]  budesonide-formoterol (SYMBICORT) 160-4.5 MCG/ACT inhaler Inhale 2 puffs into the lungs 2 (two) times daily. 07/03/16   [provider]  carvedilol (COREG) 6.25 MG tablet Take 12.5 mg by mouth 2 (two) times daily with a meal.     [provider]  diclofenac Sodium (VOLTAREN) 1 % GEL Apply 2 g topically 4 (four) times daily as needed. 09/13/19   Alfredia Client, PA-C  donepezil (ARICEPT) 10 MG tablet Take 10 mg by mouth at bedtime.    [provider]  insulin aspart (NOVOLOG FLEXPEN) 100 UNIT/ML  injection Inject 26-44 Units into the skin See admin instructions. 5 units prior to meals for cbg>= 150  Sliding scale (base is 26u in the AM, 30u at noon, and  36-44 at night    [provider]  Insulin Degludec (TRESIBA FLEXTOUCH [place]) Inject 65 Units into the skin 2 (two) times daily.    [provider]  latanoprost (XALATAN) 0.005 % ophthalmic solution Place 1 drop into both eyes at bedtime.      [provider]  memantine (NAMENDA) 5 MG tablet Take 5 mg by mouth 2 (two) times daily.    [provider]  montelukast (SINGULAIR) 10 MG tablet Take 10 mg by mouth at bedtime.    [provider]  potassium chloride SA (K-DUR,KLOR-CON) 20 MEQ tablet Take 20 mEq by mouth 3 (three) times daily. 09/24/18   [provider]  sertraline (ZOLOFT) 50 MG tablet Take 50 mg by mouth every morning.     [provider]  torsemide (DEMADEX) 20 MG tablet Take 40 mg by mouth 2 (two) times daily.    [provider]    Allergies    Buprenorphine hcl, Hydromorphone, Codeine, Mirtazapine, and Morphine and related  Review of Systems   Review of Systems  Constitutional: Negative for activity change.  HENT: Negative for drooling.   Eyes: Negative for pain.  Respiratory: Negative for shortness of breath.   Cardiovascular: Positive for chest pain. Negative for palpitations.  Gastrointestinal: Negative for diarrhea, nausea and vomiting.  Genitourinary: Positive for dysuria.  Musculoskeletal: Negative for back pain.  Skin: Negative for pallor.  Neurological: Positive for dizziness. Negative for tremors, seizures, syncope and speech difficulty.  Psychiatric/Behavioral: Negative for agitation and confusion.    Physical Exam Updated Vital Signs BP 135/70   Pulse 73   Temp 98.5 F (36.9 C) (Oral)   Resp (!) 7   Ht 5\' 11"  (1.803 m)   Wt 136.1 kg   SpO2 96%   BMI 41.84 kg/m   Physical Exam Constitutional:      Appearance: Normal appearance.  He is obese.  HENT:     Head: Normocephalic and atraumatic.  Eyes:     General: No scleral icterus.    Extraocular Movements: Extraocular movements intact.     Pupils: Pupils are equal, round, and reactive to light.  Cardiovascular:     Rate and Rhythm: Normal rate and regular rhythm.     Heart sounds: No murmur. No friction rub. No gallop.   Pulmonary:     Effort: Pulmonary effort is normal.     Breath sounds: Normal breath sounds.  Chest:     Comments: Tenderness to sternum  Abdominal:     General: Bowel sounds are normal.     Palpations: Abdomen is soft.  Musculoskeletal:     Cervical back: Neck supple. No rigidity.     Comments: Wrist:pain on right dorsal side, normal range of motion, 5/5 strength, sensation intact, cap refill <2, radial pulse 2+ Shoulder: right shoulder with significant pain, limited PE due to pain, able to extend shoulder about 90 degrees  Hip: Right hip with tenderness, limited range of motion due to pain. 4cm Ecchymosis noted on posterior side of hip  Knee: tenderness to right anterior patella and medial knee. Unable to move knee due to pain. No fluid noted on exam. No bruising.    Feet:     Comments: Right ankle with medial malleolar tenderness, able to ambulate according to wife. No tenderness elsewhere. PT 2+, no bruising, full ROM Skin:    Findings: Bruising: right hip        Neurological:     General: No focal deficit present.     Mental Status: He is alert and oriented to person, place, and time. Mental status is at baseline.     Sensory: Sensation is intact.     Motor: No seizure activity.  Psychiatric:        Mood and Affect: Mood normal.     ED Results / Procedures / Treatments   Labs (all labs ordered are listed, but only abnormal results are displayed) Labs Reviewed  BASIC METABOLIC PANEL - Abnormal; Notable for the following components:      Result Value   Glucose, Bld 161 (*)    BUN 28 (*)    Creatinine, Ser 1.53 (*)    GFR calc  non Af Amer 45 (*)    GFR calc Af Amer 53 (*)    All other components within normal limits  URINALYSIS, ROUTINE W REFLEX MICROSCOPIC - Abnormal; Notable for the following components:   Glucose, UA >=500 (*)    Leukocytes,Ua TRACE (*)    All other components within normal limits  URINALYSIS, MICROSCOPIC (REFLEX) - Abnormal; Notable for the following components:   Bacteria, UA RARE (*)    All other components within normal limits  URINE CULTURE  CBC WITH DIFFERENTIAL/PLATELET  TROPONIN I (HIGH SENSITIVITY)    EKG EKG Interpretation  Date/Time:  Tuesday September 13 2019 16:01:54 EDT Ventricular Rate:  68 PR Interval:    QRS Duration: 104 QT Interval:  406 QTC Calculation: 432 R Axis:   57 Text Interpretation: Sinus rhythm Nonspecific T abnormalities, lateral leads No STEMI Confirmed by Nanda Quinton [phone]) on 09/13/2019 4:17:47 PM   Radiology DG Chest 2 View  Result Date: 09/13/2019 CLINICAL DATA:  [place], chest discomfort EXAM: CHEST - 2 VIEW COMPARISON:  05/30/2019 FINDINGS: Frontal and lateral views of the chest demonstrate an unremarkable cardiac silhouette. No airspace disease, effusion, or pneumothorax. No acute displaced fractures. IMPRESSION: 1. No acute intrathoracic process. Electronically Signed   By: Randa Ngo M.D.   On: 09/13/2019 18:20   DG Shoulder Right  Result Date: 09/13/2019 CLINICAL DATA:  [place], limited range of motion, pain EXAM: RIGHT SHOULDER - 2+ VIEW COMPARISON:  05/14/2018 FINDINGS: Frontal and transscapular views of the right shoulder are obtained. Stable glenohumeral osteoarthritis. No fracture, subluxation, or dislocation. Right chest is clear. IMPRESSION: 1. Osteoarthritis.  No acute fracture. Electronically Signed   By: Randa Ngo M.D.   On: 09/13/2019 18:20   DG Wrist Complete Right  Result Date: 09/13/2019 CLINICAL DATA:  [place], pain EXAM: RIGHT WRIST - COMPLETE 3+ VIEW COMPARISON:  05/23/2011 FINDINGS: Frontal, oblique, and lateral views of the  right wrist are obtained. No acute displaced fracture. Multifocal osteoarthritis greatest at the first carpometacarpal joint. Soft tissues are unremarkable. IMPRESSION: 1. Osteoarthritis, no acute fracture. Electronically Signed   By: Randa Ngo M.D.   On: 09/13/2019 18:21   DG Knee 2 Views Right  Result Date: 09/13/2019 CLINICAL DATA:  [place], right knee pain EXAM: RIGHT KNEE - 1-2 VIEW COMPARISON:  None. FINDINGS: Frontal and cross-table lateral views of the right knee are obtained. Mild 3 compartment osteoarthritis. No fracture, subluxation, or dislocation. No joint effusion. IMPRESSION: 1. Mild 3 compartmental osteoarthritis.  No acute bony abnormality. Electronically Signed   By: Randa Ngo M.D.   On: 09/13/2019 18:22   DG Ankle Complete Right  Result Date: 09/13/2019 CLINICAL DATA:  [place], right ankle pain EXAM: RIGHT ANKLE - COMPLETE 3+ VIEW COMPARISON:  None. FINDINGS: Frontal, oblique, lateral views of the right ankle are obtained. No fracture, subluxation, or dislocation. Joint spaces are relatively well preserved. Small inferior calcaneal spur. Mild anterior soft tissue edema. IMPRESSION: 1. Anterior soft tissue swelling. 2. No acute displaced fracture. Electronically Signed   By: Randa Ngo M.D.   On: 09/13/2019 18:22   CT Head Wo Contrast  Result Date: 09/13/2019 CLINICAL DATA:  Ataxia head trauma EXAM: CT HEAD WITHOUT CONTRAST TECHNIQUE: Contiguous axial images were obtained from the base of the skull through the vertex without intravenous contrast. COMPARISON:  CT brain 09/03/2009, 05/14/2018 FINDINGS: Brain: No acute territorial infarction, hemorrhage, or intracranial mass. Faint focus of hyperdensity at the left posterior temporal/occipital lobe, series 2, image number 13 is chronic and unchanged compared with 2019 head CT. Ventricles are nonenlarged. Mild atrophy. Vascular: No hyperdense vessels.  Carotid vascular calcification Skull: Normal. Negative for fracture or focal  lesion. Sinuses/Orbits: No acute finding. Other: None IMPRESSION: 1. No CT evidence for acute intracranial abnormality.  Atrophy 2. Stable faint focus of hyperdensity within the left posterior temporal/occipital lobe which may be secondary to chronic calcification or hemosiderin staining from remote hemorrhage. Electronically Signed   By: Donavan Foil M.D.   On: 09/13/2019 17:26   DG Hip Unilat W or Wo Pelvis 2-3 Views Right  Result Date: 09/13/2019 CLINICAL DATA:  [place], right hip pain, limited range of motion EXAM: DG HIP (WITH OR WITHOUT PELVIS) 2-3V RIGHT COMPARISON:  None. FINDINGS: Frontal view of the pelvis as well as frontal and frogleg lateral views of the right hip are obtained. No acute displaced fracture. Joint spaces are well preserved. No subluxation or dislocation. Soft tissues are normal. IMPRESSION: 1. No acute displaced fracture. Electronically Signed   By: Randa Ngo M.D.   On: 09/13/2019 18:23    Procedures Procedures (including critical care time)  Medications Ordered in ED Medications - No data to display  ED Course  I have reviewed the triage vital signs and the nursing notes.  Pertinent labs & imaging results that were available during my care of the patient were reviewed by me and considered in my medical decision making (see chart for details).  Clinical Course as of Sep 13 1931  Tue Sep 13, 2019  1742 Troponin I (High Sensitivity): 6 [SP]    Clinical Course User Index [SP] Alfredia Client, PA-C   MDM Rules/Calculators/A&P                       Pt is a 69 year old male with a mechanical fall yesterday AM. He presents with multiple MSK injuries on the right side, hit his  head without LOC no focal neuro deficits, reports chest pain. EKG unremarkable. CXR and delt troponins pending. Low suspicion for MI due to reproducible chest pain and normal EKG. No suspicion for PE, PNX, aortic dissection. MSK imaging pending.   7:33 PM Pt with normal troponin. Normal CXR.  No need for repeat troponin due to pt's pain unchanging for >24 hours and reassuring EKG, troponin, and CXR. Pt able to ambulate normally in ER without SOB. Chest pain most likely MSK related.   Xrays show arthritis without any fracture. Head CT without bleeding. Imaging discussed with pt. Discussed pain management with pt, pt wants to stick with over the counter pain medications and topical gels. Prescribed pt Voltaren to use as needed over MSK injuries. Gave pt ED return precautions. Pt will f/u with Sports Med and PCP this week. Pt acknowledged plan.   Noted urine findings. Will follow Urine Culture and treat based on culture.    Final Clinical Impression(s) / ED Diagnoses Final diagnoses:  Fall, initial encounter  Injury of head, initial encounter  Right arm pain  Right leg pain  Precordial pain    Rx / DC Orders ED Discharge Orders         Ordered    diclofenac Sodium (VOLTAREN) 1 % GEL  4 times daily PRN     09/13/19 1852           Alfredia Client, PA-C 09/13/19 2317    Margette Fast, MD 09/14/19 2315

## 2019-09-14 LAB — URINE CULTURE: Culture: NO GROWTH

## 2020-06-08 IMAGING — DX DG CHEST 2V
2 series · 2 of 2 positions shown · non-contrast
Comparison: 05/30/2019

CLINICAL DATA: Fell, chest discomfort

EXAM:
CHEST - 2 VIEW

[chest pa]
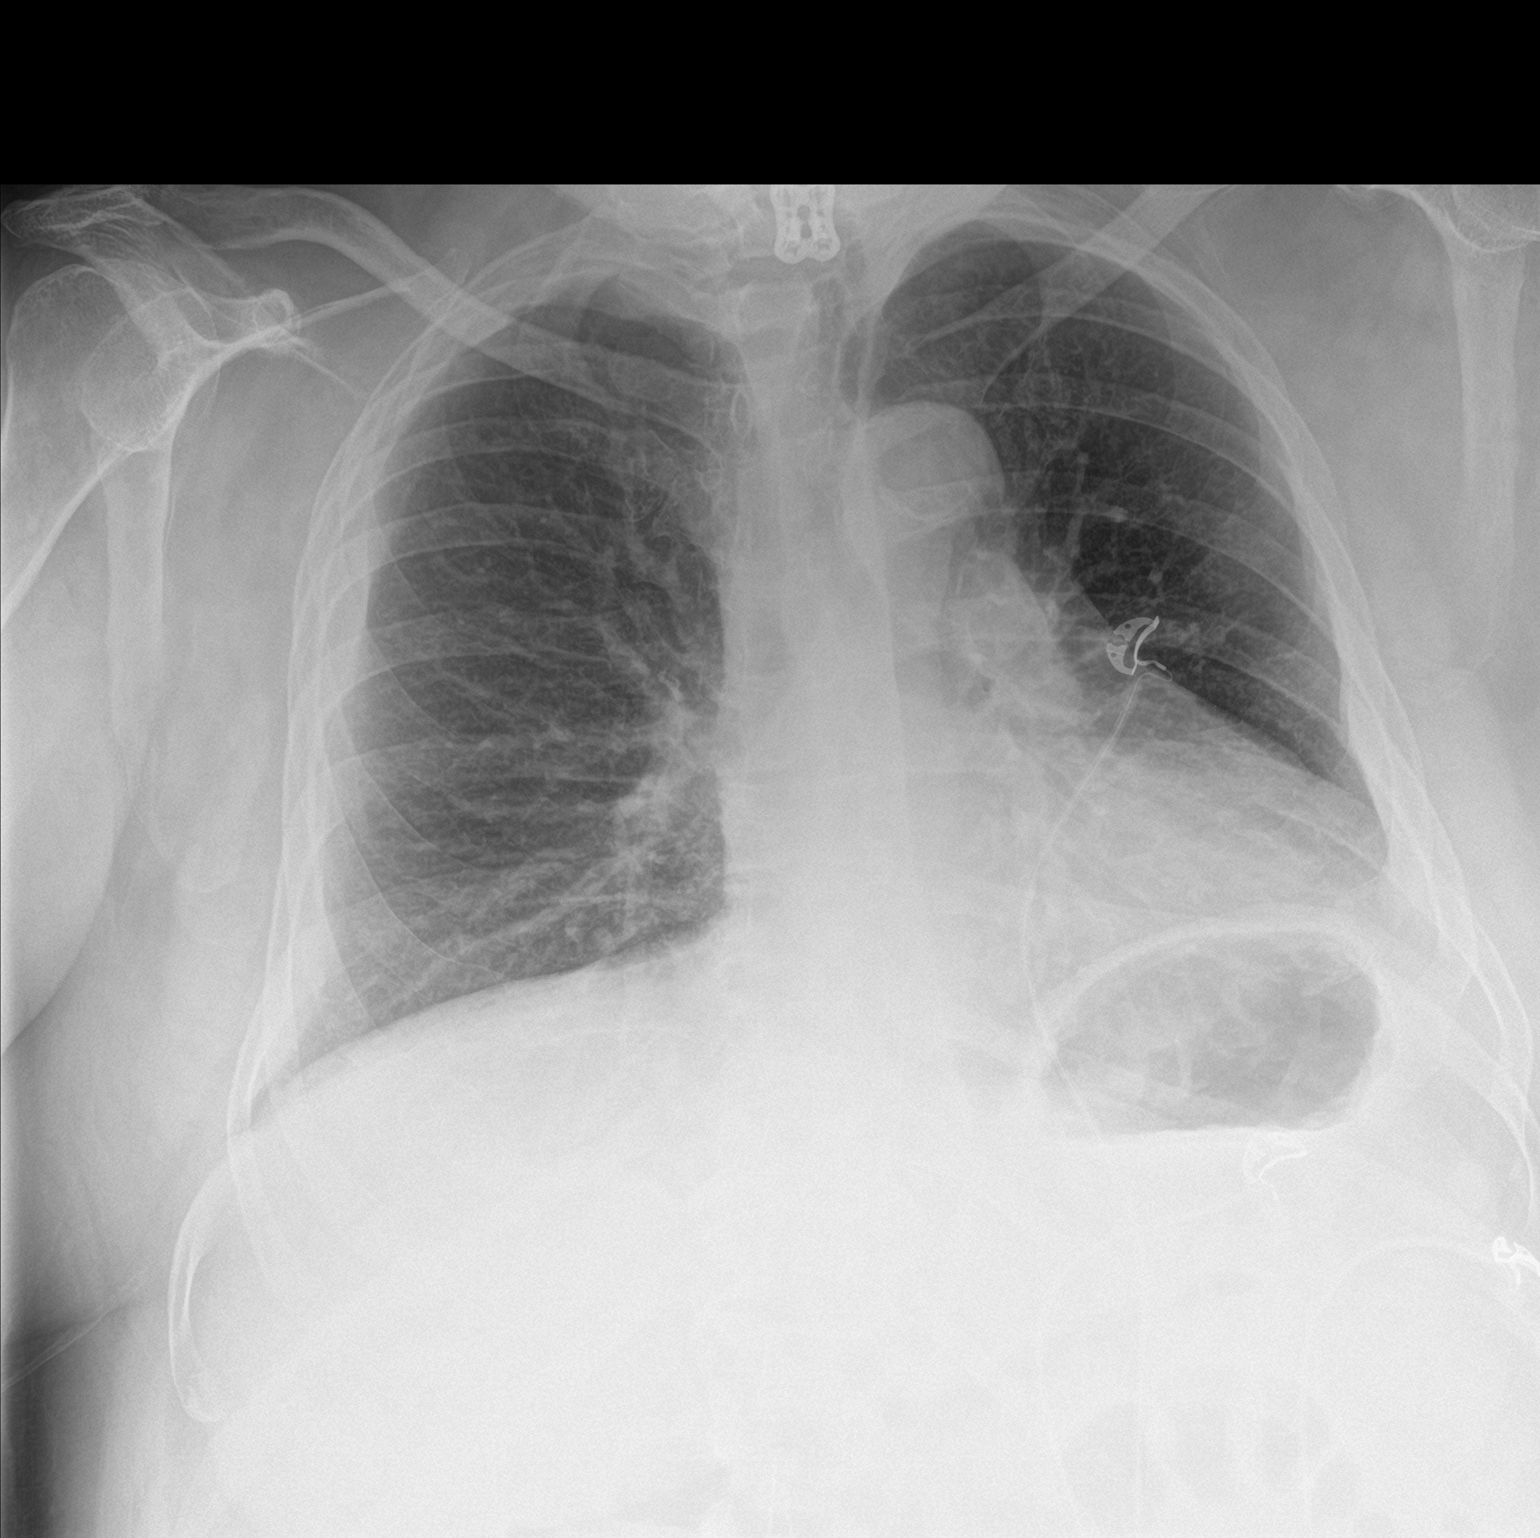

[chest lat]
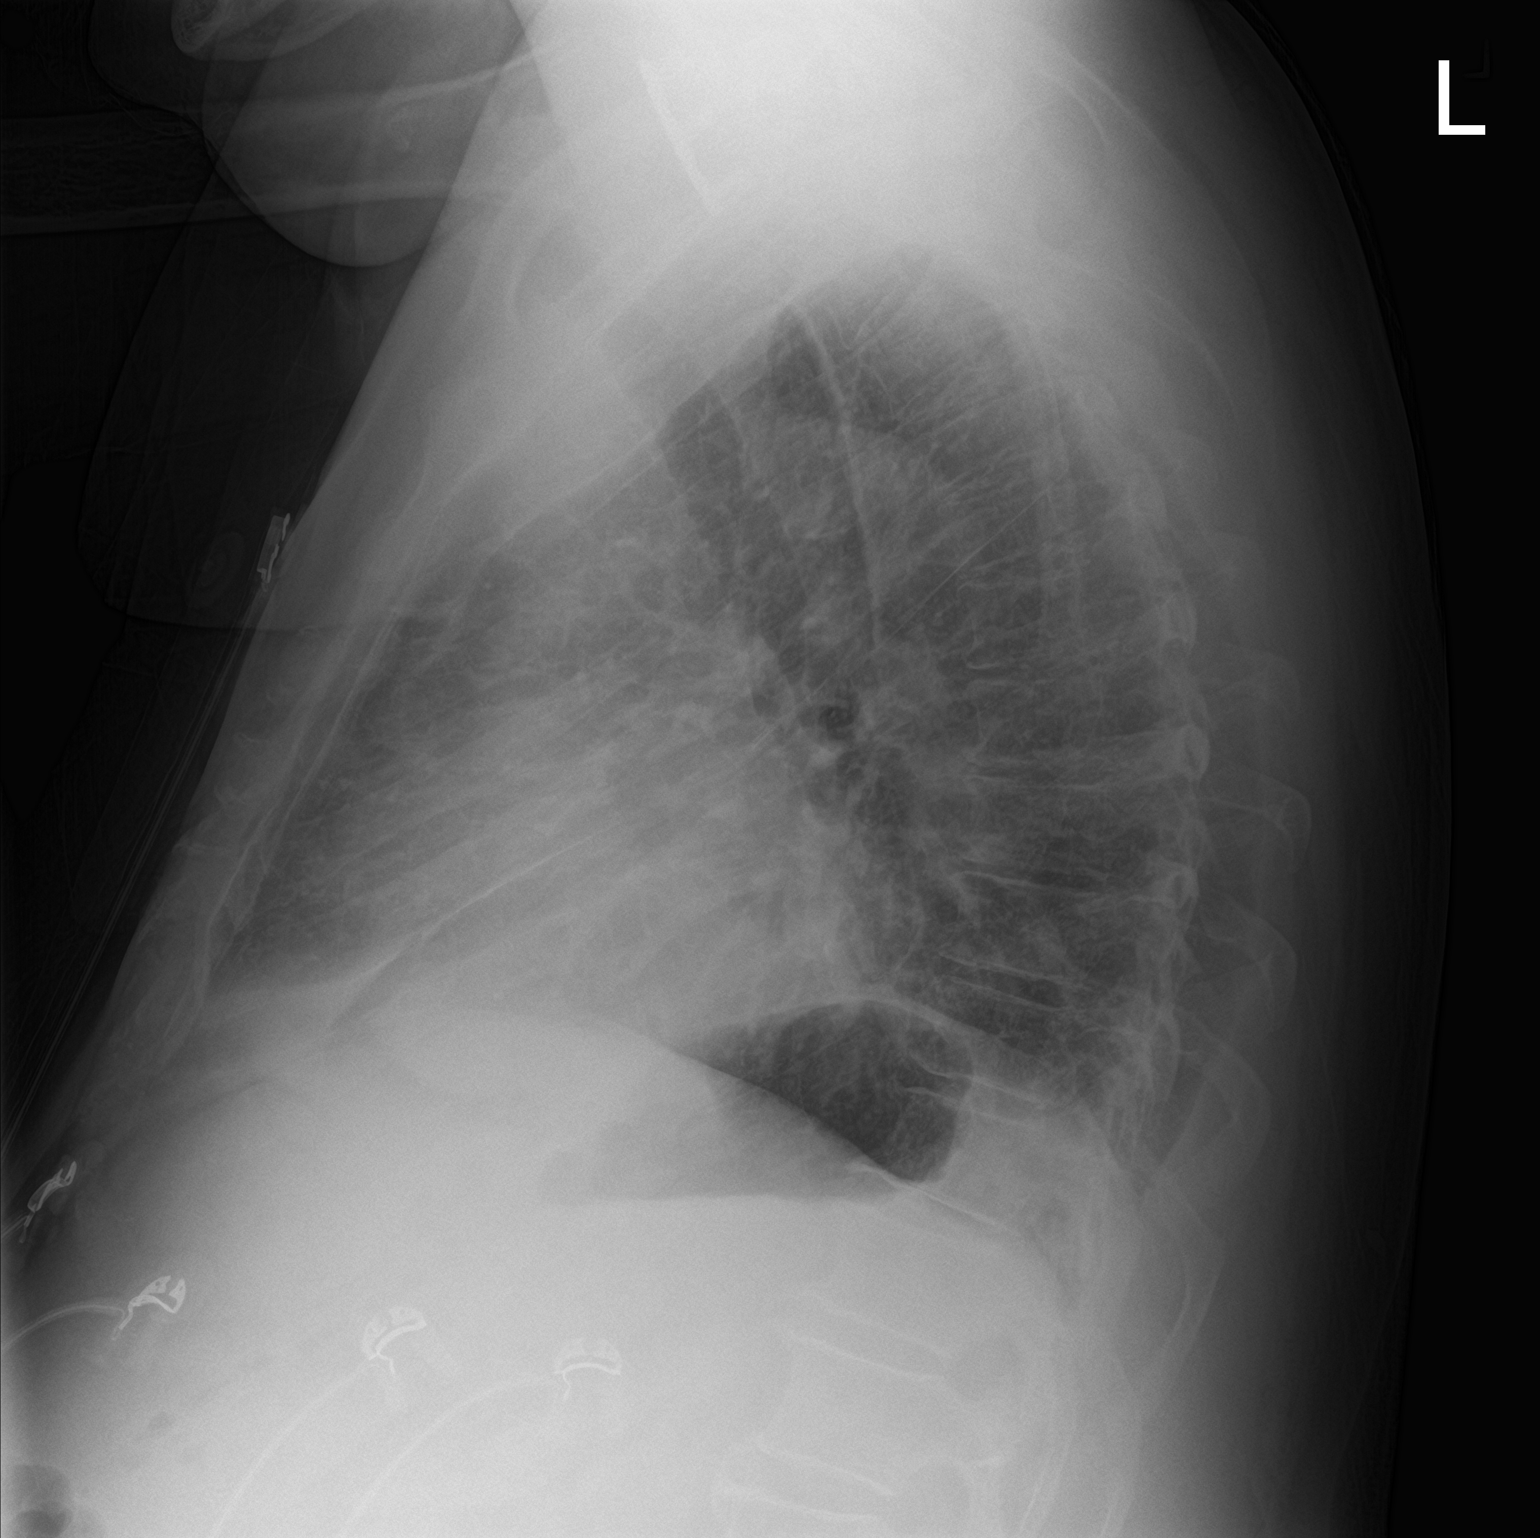

[2 of 2 positions shown; findings below may reference images not displayed]

FINDINGS: Frontal and lateral views of the chest demonstrate an unremarkable
cardiac silhouette. No airspace disease, effusion, or pneumothorax.
No acute displaced fractures.
IMPRESSION: 1. No acute intrathoracic process.

## 2020-06-08 IMAGING — DX DG HIP (WITH OR WITHOUT PELVIS) 2-3V*R*
3 series · 3 of 3 positions shown · non-contrast
Comparison: None.

CLINICAL DATA: Fell, right hip pain, limited range of motion

EXAM:
DG HIP (WITH OR WITHOUT PELVIS) 2-3V RIGHT

[pelvis ap]
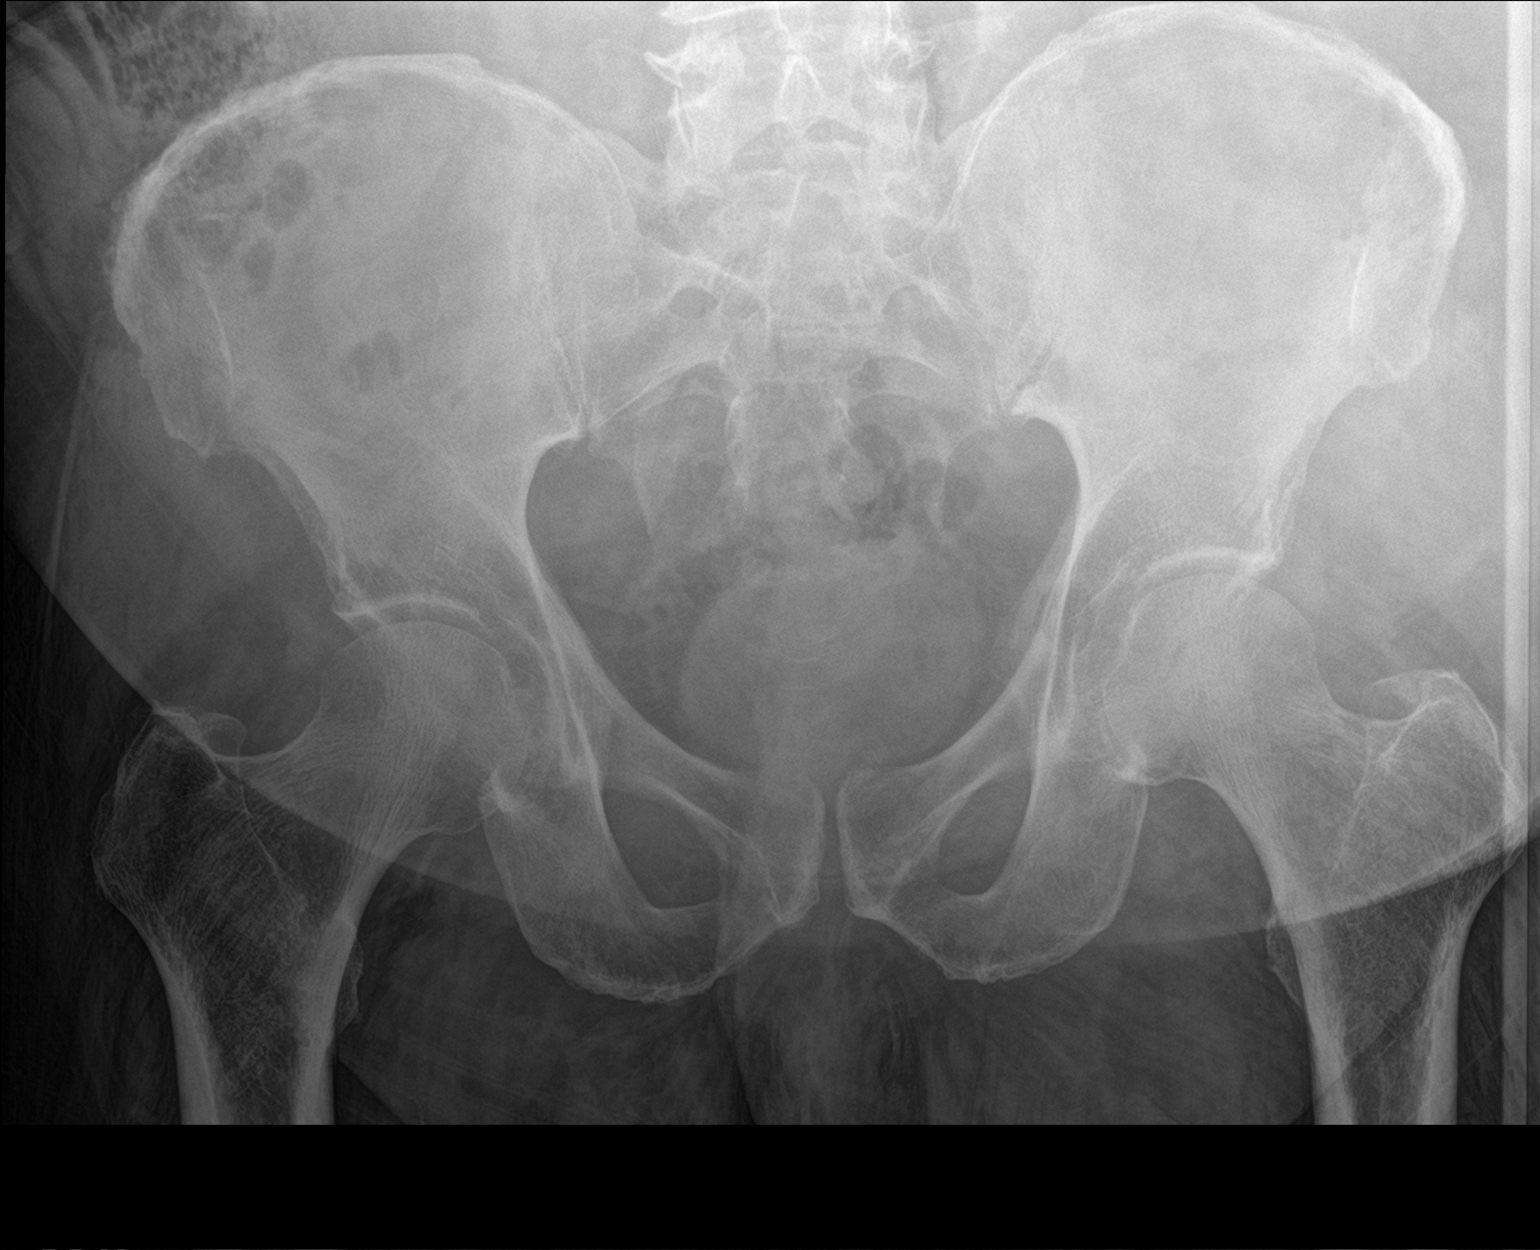

[hip ap]
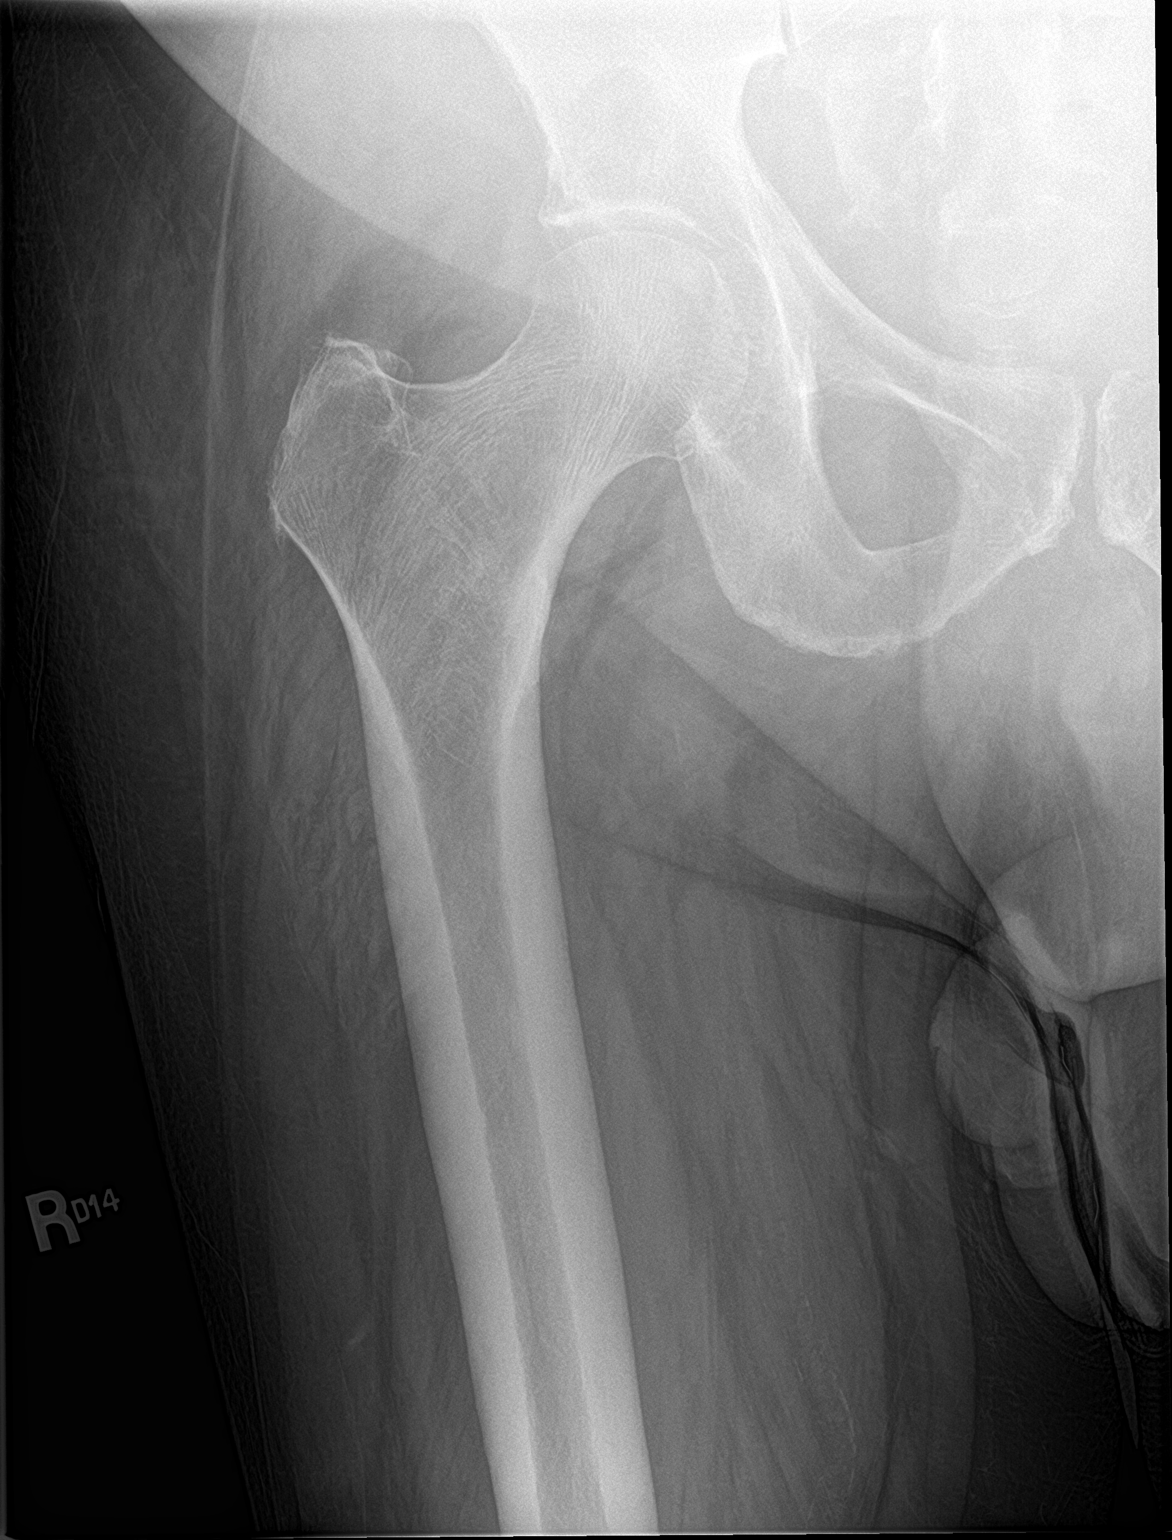

[hip lat]
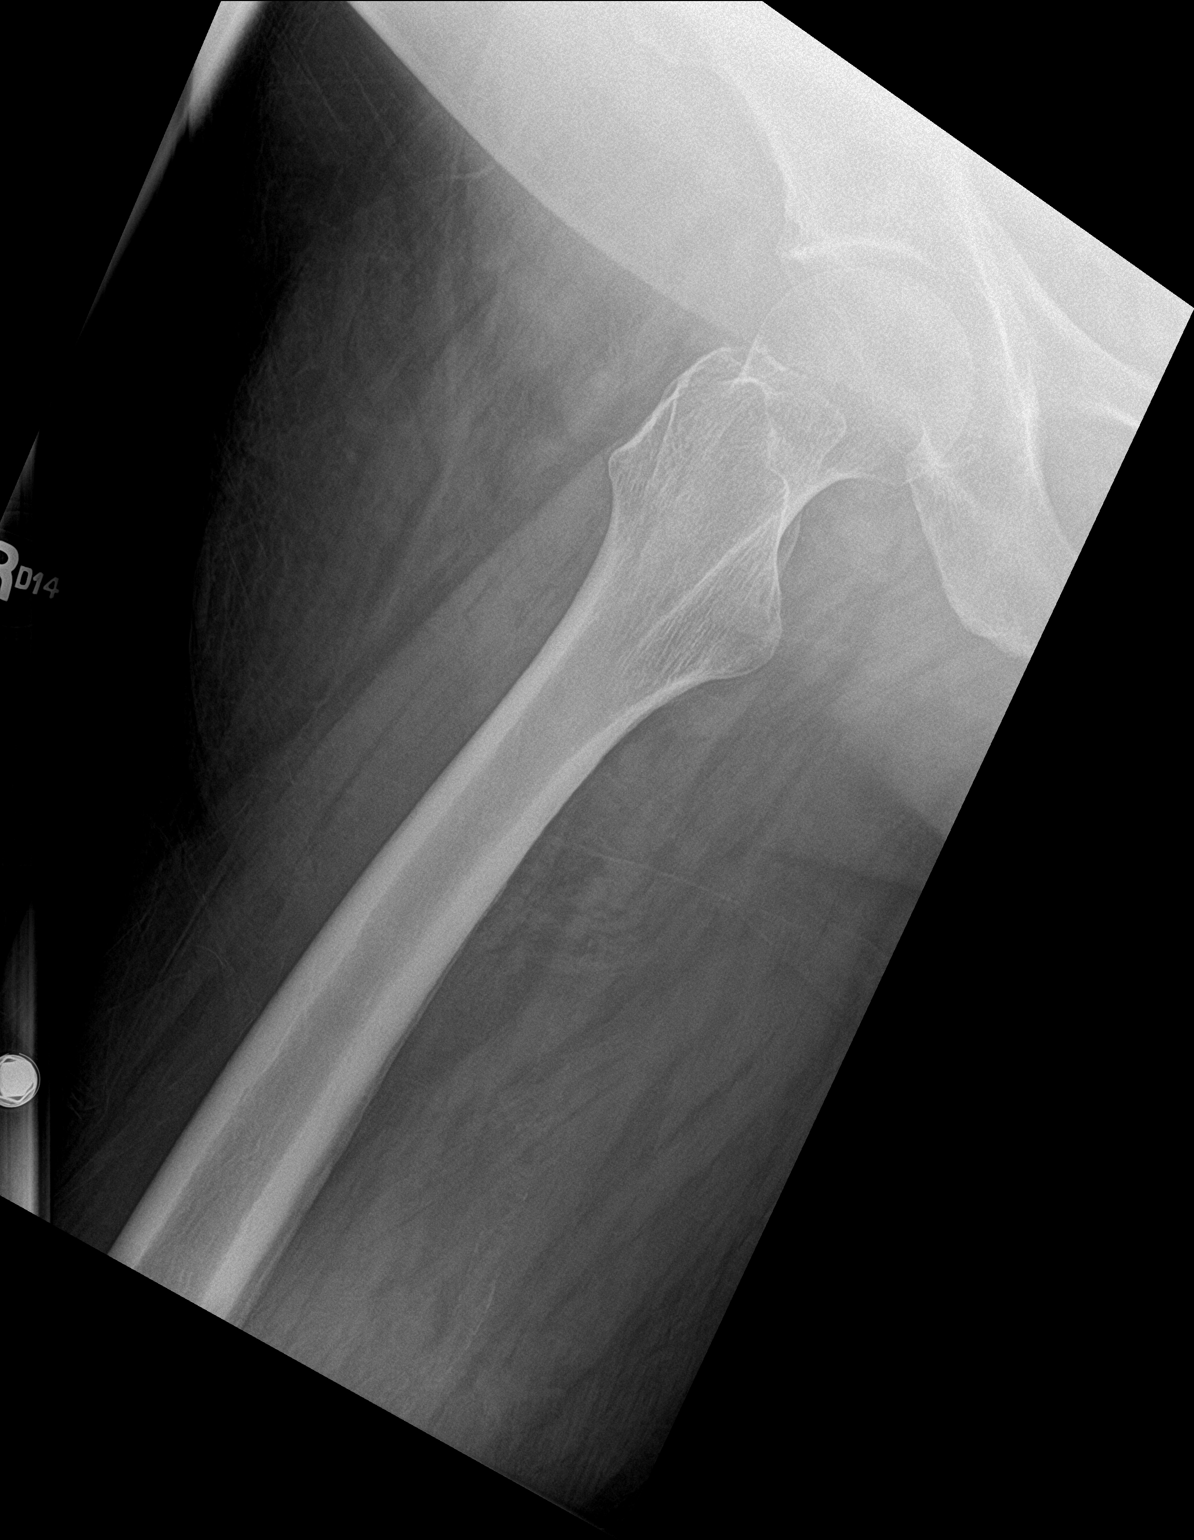

[3 of 3 positions shown; findings below may reference images not displayed]

FINDINGS: Frontal view of the pelvis as well as frontal and frogleg lateral
views of the right hip are obtained. No acute displaced fracture.
Joint spaces are well preserved. No subluxation or dislocation. Soft
tissues are normal.
IMPRESSION: 1. No acute displaced fracture.

## 2020-06-08 IMAGING — DX DG KNEE 1-2V*R*
2 series · 2 of 2 positions shown · non-contrast
Comparison: None.

CLINICAL DATA: Fell, right knee pain

EXAM:
RIGHT KNEE - 1-2 VIEW

[knee ap]
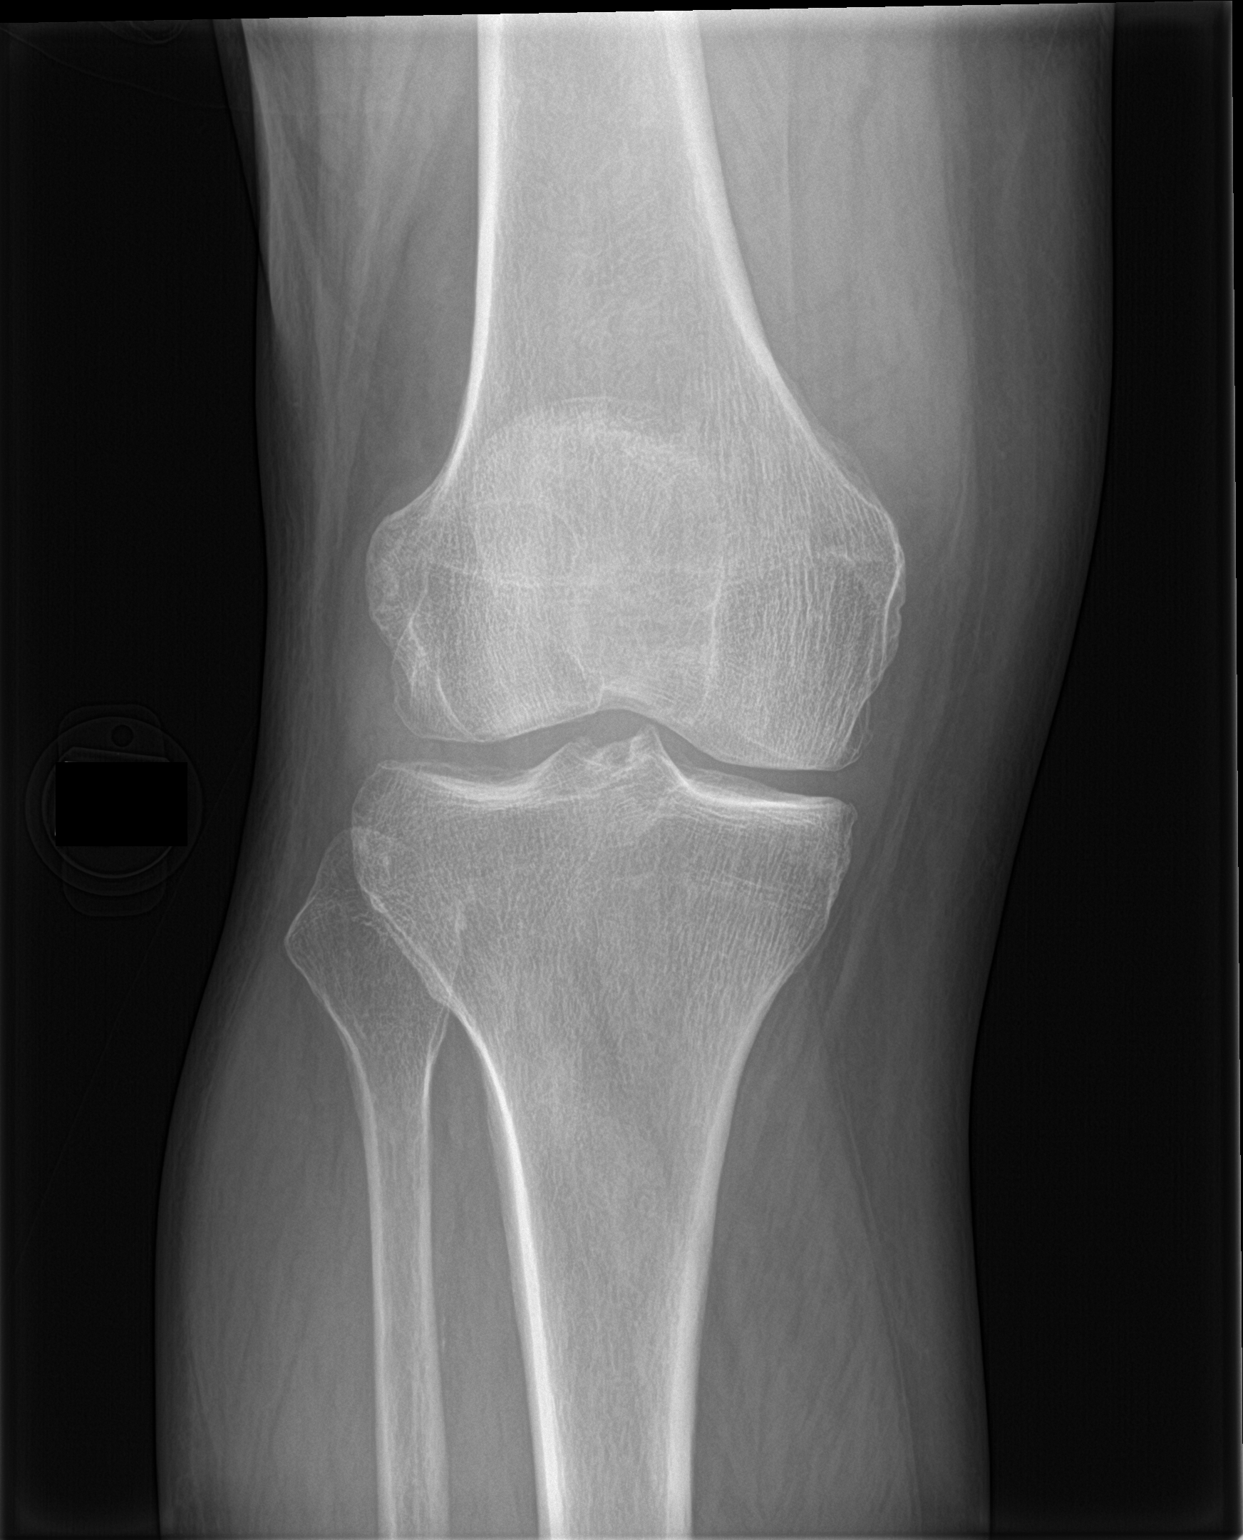

[knee lat]
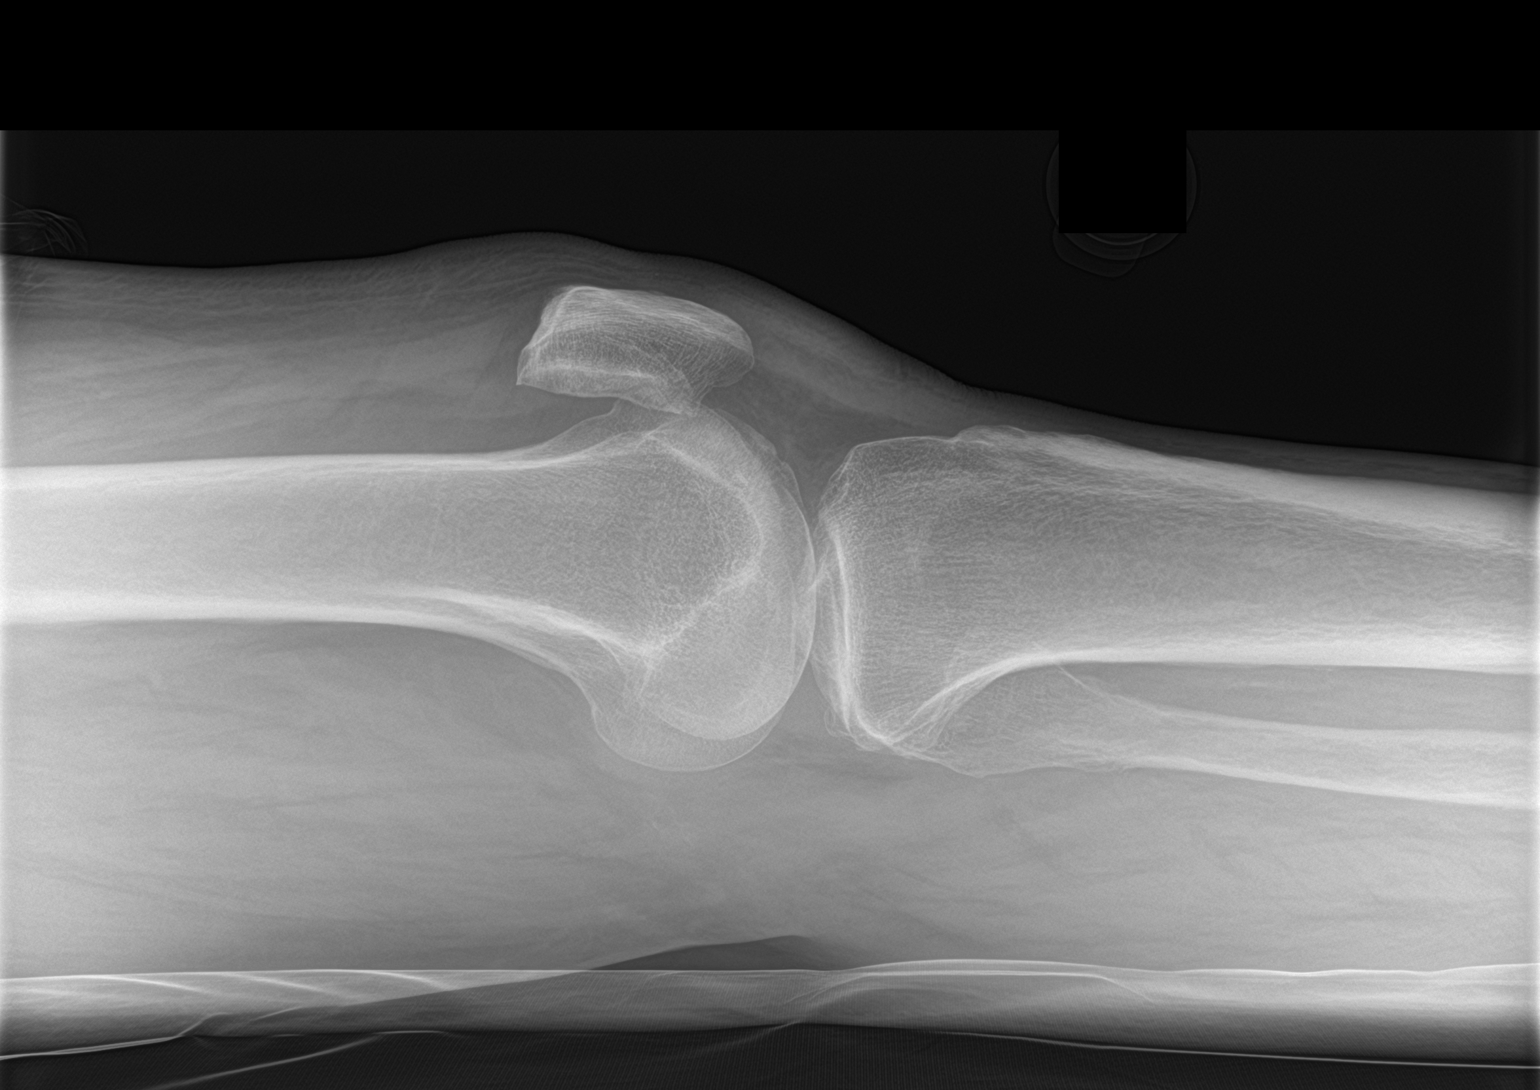

[2 of 2 positions shown; findings below may reference images not displayed]

FINDINGS: Frontal and cross-table lateral views of the right knee are
obtained. Mild 3 compartment osteoarthritis. No fracture,
subluxation, or dislocation. No joint effusion.
IMPRESSION: 1. Mild 3 compartmental osteoarthritis.  No acute bony abnormality.

## 2020-06-08 IMAGING — DX DG ANKLE COMPLETE 3+V*R*
3 series · 3 of 3 positions shown · non-contrast
Comparison: None.

CLINICAL DATA: Fell, right ankle pain

EXAM:
RIGHT ANKLE - COMPLETE 3+ VIEW

[ankle ap]
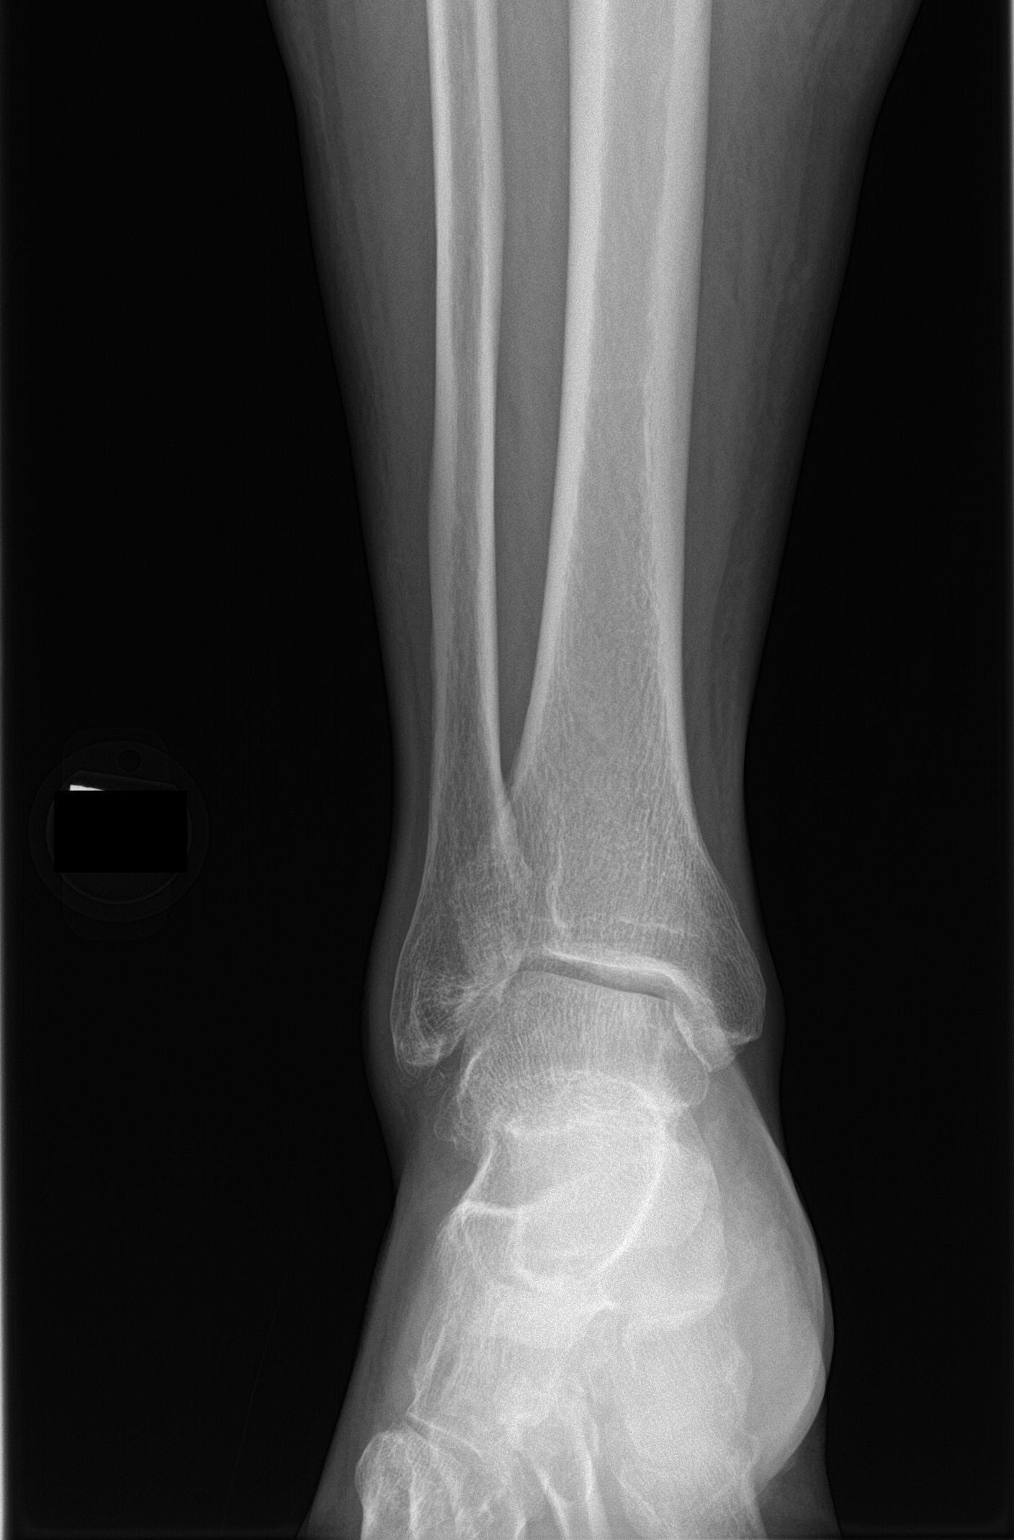

[ankle obl]
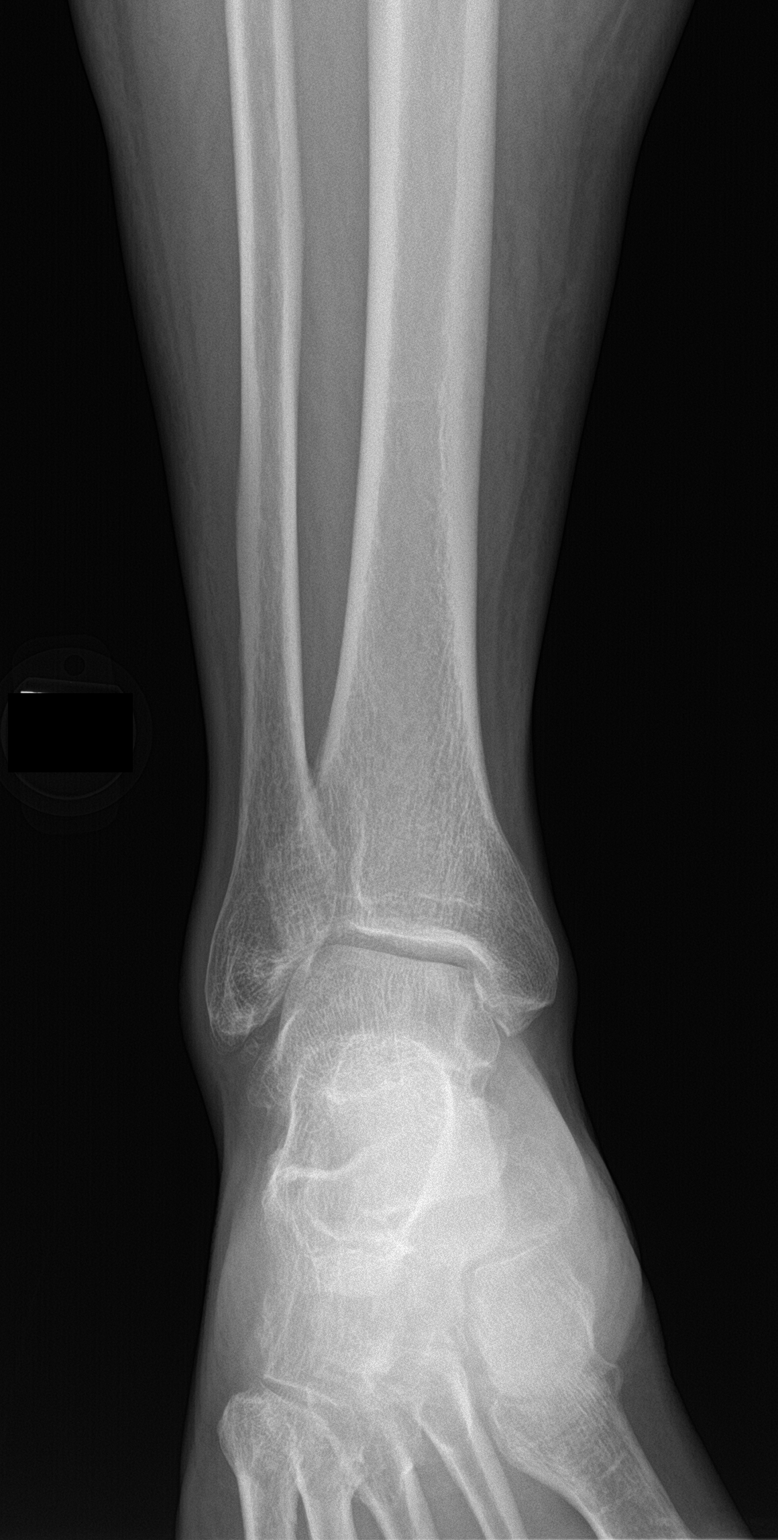

[ankle lat]
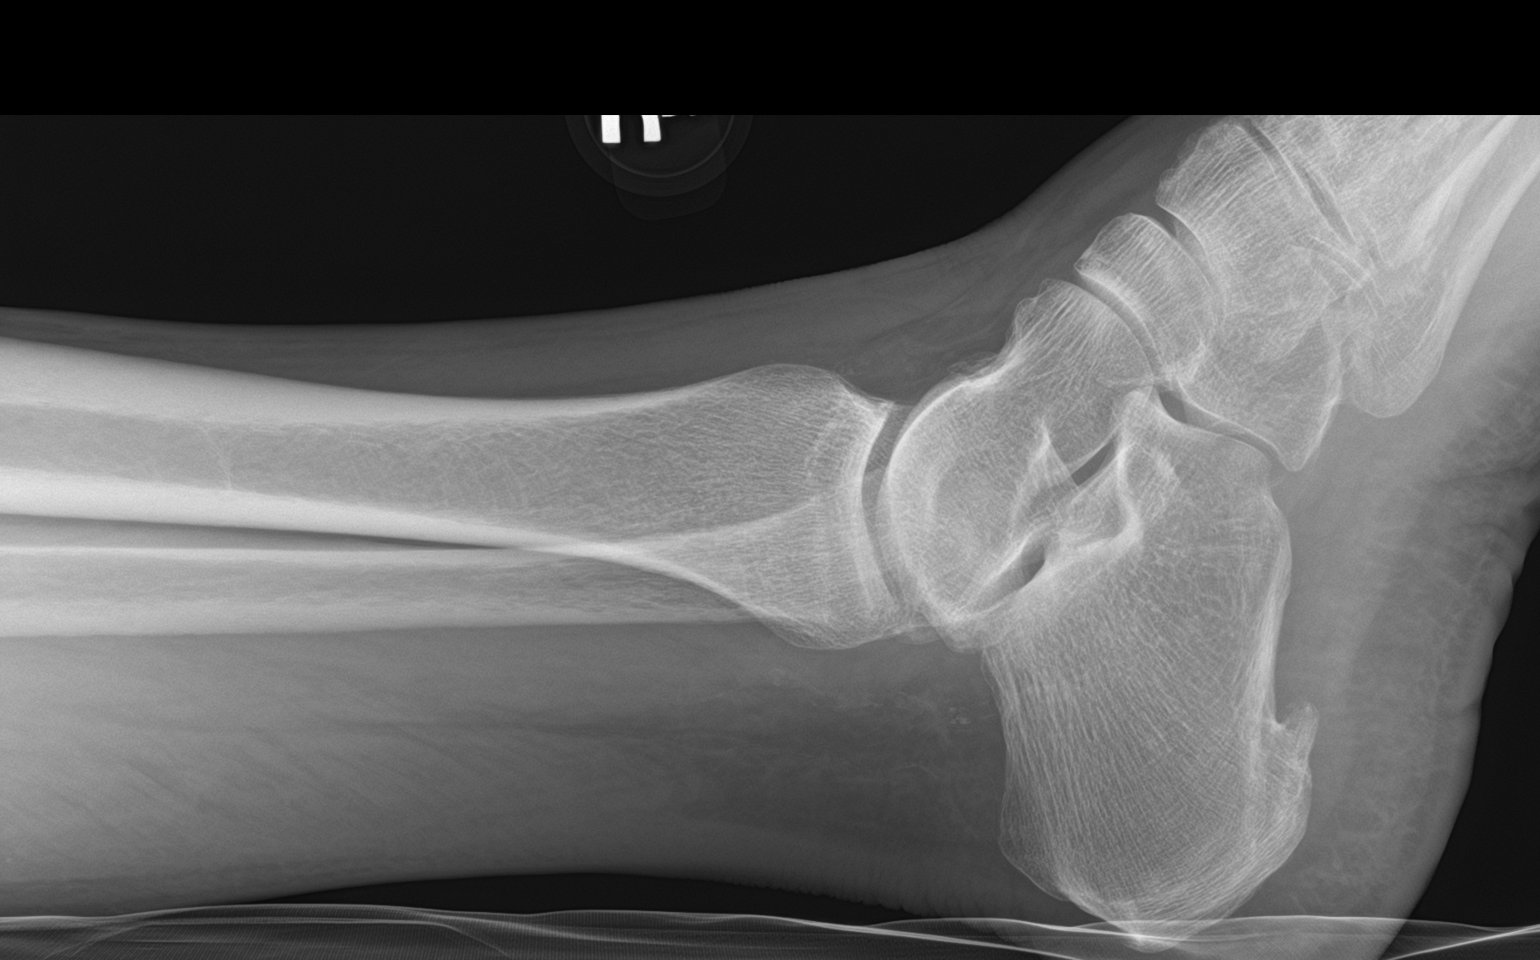

[3 of 3 positions shown; findings below may reference images not displayed]

FINDINGS: Frontal, oblique, lateral views of the right ankle are obtained. No
fracture, subluxation, or dislocation. Joint spaces are relatively
well preserved. Small inferior calcaneal spur. Mild anterior soft
tissue edema.
IMPRESSION: 1. Anterior soft tissue swelling.
2. No acute displaced fracture.

## 2021-08-01 DIAGNOSIS — N1832 Chronic kidney disease, stage 3b: Secondary | ICD-10-CM | POA: Diagnosis present

## 2023-06-06 ENCOUNTER — Inpatient Hospital Stay (HOSPITAL_BASED_OUTPATIENT_CLINIC_OR_DEPARTMENT_OTHER)
Admission: EM | Admit: 2023-06-06 | Discharge: 2023-06-10 | DRG: 871 | Disposition: A | Discharge status: Home Health | Payer: Medicare HMO | Attending: Internal Medicine | Admitting: Internal Medicine

## 2023-06-06 ENCOUNTER — Encounter (HOSPITAL_BASED_OUTPATIENT_CLINIC_OR_DEPARTMENT_OTHER): Payer: Self-pay

## 2023-06-06 ENCOUNTER — Other Ambulatory Visit: Payer: Self-pay

## 2023-06-06 ENCOUNTER — Emergency Department (HOSPITAL_BASED_OUTPATIENT_CLINIC_OR_DEPARTMENT_OTHER): Payer: Medicare HMO

## 2023-06-06 DIAGNOSIS — E1122 Type 2 diabetes mellitus with diabetic chronic kidney disease: Secondary | ICD-10-CM | POA: Diagnosis present

## 2023-06-06 DIAGNOSIS — A4189 Other specified sepsis: Principal | ICD-10-CM | POA: Diagnosis present

## 2023-06-06 DIAGNOSIS — E1165 Type 2 diabetes mellitus with hyperglycemia: Secondary | ICD-10-CM | POA: Diagnosis present

## 2023-06-06 DIAGNOSIS — I6522 Occlusion and stenosis of left carotid artery: Secondary | ICD-10-CM | POA: Diagnosis present

## 2023-06-06 DIAGNOSIS — E66812 Obesity, class 2: Secondary | ICD-10-CM | POA: Diagnosis present

## 2023-06-06 DIAGNOSIS — I130 Hypertensive heart and chronic kidney disease with heart failure and stage 1 through stage 4 chronic kidney disease, or unspecified chronic kidney disease: Secondary | ICD-10-CM | POA: Diagnosis present

## 2023-06-06 DIAGNOSIS — N401 Enlarged prostate with lower urinary tract symptoms: Secondary | ICD-10-CM | POA: Diagnosis present

## 2023-06-06 DIAGNOSIS — E119 Type 2 diabetes mellitus without complications: Secondary | ICD-10-CM

## 2023-06-06 DIAGNOSIS — J9611 Chronic respiratory failure with hypoxia: Secondary | ICD-10-CM | POA: Diagnosis present

## 2023-06-06 DIAGNOSIS — J4541 Moderate persistent asthma with (acute) exacerbation: Secondary | ICD-10-CM

## 2023-06-06 DIAGNOSIS — Y95 Nosocomial condition: Secondary | ICD-10-CM | POA: Diagnosis not present

## 2023-06-06 DIAGNOSIS — R0602 Shortness of breath: Secondary | ICD-10-CM | POA: Diagnosis present

## 2023-06-06 DIAGNOSIS — F03A3 Unspecified dementia, mild, with mood disturbance: Secondary | ICD-10-CM | POA: Diagnosis present

## 2023-06-06 DIAGNOSIS — E8721 Acute metabolic acidosis: Secondary | ICD-10-CM | POA: Diagnosis present

## 2023-06-06 DIAGNOSIS — E7800 Pure hypercholesterolemia, unspecified: Secondary | ICD-10-CM | POA: Diagnosis present

## 2023-06-06 DIAGNOSIS — I2510 Atherosclerotic heart disease of native coronary artery without angina pectoris: Secondary | ICD-10-CM | POA: Diagnosis present

## 2023-06-06 DIAGNOSIS — I5033 Acute on chronic diastolic (congestive) heart failure: Secondary | ICD-10-CM | POA: Diagnosis present

## 2023-06-06 DIAGNOSIS — K219 Gastro-esophageal reflux disease without esophagitis: Secondary | ICD-10-CM | POA: Diagnosis present

## 2023-06-06 DIAGNOSIS — J441 Chronic obstructive pulmonary disease with (acute) exacerbation: Secondary | ICD-10-CM

## 2023-06-06 DIAGNOSIS — I350 Nonrheumatic aortic (valve) stenosis: Secondary | ICD-10-CM | POA: Diagnosis present

## 2023-06-06 DIAGNOSIS — D72829 Elevated white blood cell count, unspecified: Secondary | ICD-10-CM | POA: Diagnosis present

## 2023-06-06 DIAGNOSIS — I480 Paroxysmal atrial fibrillation: Secondary | ICD-10-CM | POA: Diagnosis present

## 2023-06-06 DIAGNOSIS — R4189 Other symptoms and signs involving cognitive functions and awareness: Secondary | ICD-10-CM | POA: Diagnosis present

## 2023-06-06 DIAGNOSIS — I5032 Chronic diastolic (congestive) heart failure: Secondary | ICD-10-CM | POA: Diagnosis not present

## 2023-06-06 DIAGNOSIS — Z794 Long term (current) use of insulin: Secondary | ICD-10-CM

## 2023-06-06 DIAGNOSIS — I771 Stricture of artery: Secondary | ICD-10-CM | POA: Diagnosis present

## 2023-06-06 DIAGNOSIS — N179 Acute kidney failure, unspecified: Secondary | ICD-10-CM | POA: Diagnosis present

## 2023-06-06 DIAGNOSIS — G4733 Obstructive sleep apnea (adult) (pediatric): Secondary | ICD-10-CM | POA: Diagnosis present

## 2023-06-06 DIAGNOSIS — Z9981 Dependence on supplemental oxygen: Secondary | ICD-10-CM

## 2023-06-06 DIAGNOSIS — Z6837 Body mass index (BMI) 37.0-37.9, adult: Secondary | ICD-10-CM

## 2023-06-06 DIAGNOSIS — Z7902 Long term (current) use of antithrombotics/antiplatelets: Secondary | ICD-10-CM

## 2023-06-06 DIAGNOSIS — Z885 Allergy status to narcotic agent status: Secondary | ICD-10-CM

## 2023-06-06 DIAGNOSIS — G8929 Other chronic pain: Secondary | ICD-10-CM | POA: Diagnosis present

## 2023-06-06 DIAGNOSIS — N1831 Chronic kidney disease, stage 3a: Secondary | ICD-10-CM | POA: Diagnosis present

## 2023-06-06 DIAGNOSIS — Z79899 Other long term (current) drug therapy: Secondary | ICD-10-CM

## 2023-06-06 DIAGNOSIS — E876 Hypokalemia: Secondary | ICD-10-CM | POA: Diagnosis present

## 2023-06-06 DIAGNOSIS — F03A4 Unspecified dementia, mild, with anxiety: Secondary | ICD-10-CM | POA: Diagnosis present

## 2023-06-06 DIAGNOSIS — D631 Anemia in chronic kidney disease: Secondary | ICD-10-CM | POA: Diagnosis present

## 2023-06-06 DIAGNOSIS — A419 Sepsis, unspecified organism: Principal | ICD-10-CM | POA: Diagnosis present

## 2023-06-06 DIAGNOSIS — J189 Pneumonia, unspecified organism: Secondary | ICD-10-CM | POA: Diagnosis not present

## 2023-06-06 DIAGNOSIS — Z1152 Encounter for screening for COVID-19: Secondary | ICD-10-CM

## 2023-06-06 DIAGNOSIS — Z7901 Long term (current) use of anticoagulants: Secondary | ICD-10-CM

## 2023-06-06 DIAGNOSIS — Z7951 Long term (current) use of inhaled steroids: Secondary | ICD-10-CM

## 2023-06-06 DIAGNOSIS — F319 Bipolar disorder, unspecified: Secondary | ICD-10-CM | POA: Diagnosis present

## 2023-06-06 DIAGNOSIS — N3941 Urge incontinence: Secondary | ICD-10-CM | POA: Diagnosis present

## 2023-06-06 DIAGNOSIS — Z87891 Personal history of nicotine dependence: Secondary | ICD-10-CM

## 2023-06-06 DIAGNOSIS — I714 Abdominal aortic aneurysm, without rupture, unspecified: Secondary | ICD-10-CM | POA: Diagnosis present

## 2023-06-06 LAB — CBC WITH DIFFERENTIAL/PLATELET
Abs Immature Granulocytes: 0.19 10*3/uL — ABNORMAL HIGH (ref 0.00–0.07)
Basophils Absolute: 0.1 10*3/uL (ref 0.0–0.1)
Basophils Relative: 0 %
Eosinophils Absolute: 0 10*3/uL (ref 0.0–0.5)
Eosinophils Relative: 0 %
HCT: 35.4 % — ABNORMAL LOW (ref 39.0–52.0)
Hemoglobin: 10.6 g/dL — ABNORMAL LOW (ref 13.0–17.0)
Immature Granulocytes: 1 %
Lymphocytes Relative: 2 %
Lymphs Abs: 0.6 10*3/uL — ABNORMAL LOW (ref 0.7–4.0)
MCH: 23.1 pg — ABNORMAL LOW (ref 26.0–34.0)
MCHC: 29.9 g/dL — ABNORMAL LOW (ref 30.0–36.0)
MCV: 77.1 fL — ABNORMAL LOW (ref 80.0–100.0)
Monocytes Absolute: 1.6 10*3/uL — ABNORMAL HIGH (ref 0.1–1.0)
Monocytes Relative: 6 %
Neutro Abs: 22 10*3/uL — ABNORMAL HIGH (ref 1.7–7.7)
Neutrophils Relative %: 91 %
Platelets: 337 10*3/uL (ref 150–400)
RBC: 4.59 MIL/uL (ref 4.22–5.81)
RDW: 16.7 % — ABNORMAL HIGH (ref 11.5–15.5)
WBC: 24.4 10*3/uL — ABNORMAL HIGH (ref 4.0–10.5)
nRBC: 0 % (ref 0.0–0.2)

## 2023-06-06 LAB — LACTIC ACID, PLASMA: Lactic Acid, Venous: 1.6 mmol/L (ref 0.5–1.9)

## 2023-06-06 LAB — URINALYSIS, W/ REFLEX TO CULTURE (INFECTION SUSPECTED)
Bilirubin Urine: NEGATIVE
Glucose, UA: >=500 mg/dL — AB
Ketones, ur: NEGATIVE mg/dL
Leukocytes,Ua: NEGATIVE
Nitrite: NEGATIVE
Protein, ur: NEGATIVE mg/dL
Specific Gravity, Urine: 1.01 (ref 1.005–1.030)
pH: 5.5 (ref 5.0–8.0)

## 2023-06-06 LAB — COMPREHENSIVE METABOLIC PANEL
ALT: 41 U/L (ref 0–44)
AST: 29 U/L (ref 15–41)
Albumin: 3.2 g/dL — ABNORMAL LOW (ref 3.5–5.0)
Alkaline Phosphatase: 84 U/L (ref 38–126)
Anion gap: 10 (ref 5–15)
BUN: 22 mg/dL (ref 8–23)
CO2: 20 mmol/L — ABNORMAL LOW (ref 22–32)
Calcium: 8.6 mg/dL — ABNORMAL LOW (ref 8.9–10.3)
Chloride: 107 mmol/L (ref 98–111)
Creatinine, Ser: 1.62 mg/dL — ABNORMAL HIGH (ref 0.61–1.24)
GFR, Estimated: 44 mL/min — ABNORMAL LOW (ref >60)
Glucose, Bld: 186 mg/dL — ABNORMAL HIGH (ref 70–99)
Potassium: 3.7 mmol/L (ref 3.5–5.1)
Sodium: 137 mmol/L (ref 135–145)
Total Bilirubin: 1.2 mg/dL — ABNORMAL HIGH (ref <1.2)
Total Protein: 7.5 g/dL (ref 6.5–8.1)

## 2023-06-06 LAB — APTT: aPTT: 43 s — ABNORMAL HIGH (ref 24–36)

## 2023-06-06 LAB — PROTIME-INR
INR: 1.3 — ABNORMAL HIGH (ref 0.8–1.2)
Prothrombin Time: 16.6 s — ABNORMAL HIGH (ref 11.4–15.2)

## 2023-06-06 LAB — TROPONIN I (HIGH SENSITIVITY)
Troponin I (High Sensitivity): 37 ng/L — ABNORMAL HIGH (ref <18)
Troponin I (High Sensitivity): 42 ng/L — ABNORMAL HIGH (ref <18)

## 2023-06-06 LAB — RESP PANEL BY RT-PCR (RSV, FLU A&B, COVID)  RVPGX2
Influenza A by PCR: NEGATIVE
Influenza B by PCR: NEGATIVE
Resp Syncytial Virus by PCR: NEGATIVE
SARS Coronavirus 2 by RT PCR: NEGATIVE

## 2023-06-06 LAB — GLUCOSE, CAPILLARY
Glucose-Capillary: 196 mg/dL — ABNORMAL HIGH (ref 70–99)
Glucose-Capillary: 196 mg/dL — ABNORMAL HIGH (ref 70–99)

## 2023-06-06 LAB — BRAIN NATRIURETIC PEPTIDE: B Natriuretic Peptide: 485.2 pg/mL — ABNORMAL HIGH (ref 0.0–100.0)

## 2023-06-06 LAB — MRSA NEXT GEN BY PCR, NASAL: MRSA by PCR Next Gen: NOT DETECTED

## 2023-06-06 MED ORDER — VANCOMYCIN HCL IN DEXTROSE 1-5 GM/200ML-% IV SOLN
1000.0000 mg | Freq: Once | INTRAVENOUS | Status: AC
  Administered 2023-06-06: 1000 mg via INTRAVENOUS
  Filled 2023-06-06: qty 200

## 2023-06-06 MED ORDER — ONDANSETRON HCL 4 MG/2ML IJ SOLN: 4.0000 mg | Freq: Four times a day (QID) | INTRAMUSCULAR | Status: DC | PRN

## 2023-06-06 MED ORDER — OXYBUTYNIN CHLORIDE ER 5 MG PO TB24
10.0000 mg | ORAL_TABLET | Freq: Every day | ORAL | Status: DC
  Administered 2023-06-07 – 2023-06-10 (×4): 10 mg via ORAL
  Filled 2023-06-06 (×4): qty 2

## 2023-06-06 MED ORDER — ONDANSETRON HCL 4 MG PO TABS: 4.0000 mg | ORAL_TABLET | Freq: Four times a day (QID) | ORAL | Status: DC | PRN

## 2023-06-06 MED ORDER — BUDESONIDE 0.5 MG/2ML IN SUSP
2.0000 mL | Freq: Two times a day (BID) | RESPIRATORY_TRACT | Status: DC
  Administered 2023-06-06 – 2023-06-10 (×8): 0.5 mg via RESPIRATORY_TRACT
  Filled 2023-06-06 (×8): qty 2

## 2023-06-06 MED ORDER — IPRATROPIUM-ALBUTEROL 0.5-2.5 (3) MG/3ML IN SOLN
3.0000 mL | Freq: Four times a day (QID) | RESPIRATORY_TRACT | Status: DC
  Administered 2023-06-06 – 2023-06-07 (×5): 3 mL via RESPIRATORY_TRACT
  Filled 2023-06-06 (×5): qty 3

## 2023-06-06 MED ORDER — ACETAMINOPHEN 500 MG PO TABS
1000.0000 mg | ORAL_TABLET | Freq: Once | ORAL | Status: AC
Start: 2023-06-06 — End: 2023-06-06
  Administered 2023-06-06: 1000 mg via ORAL
  Filled 2023-06-06: qty 2

## 2023-06-06 MED ORDER — CARVEDILOL 12.5 MG PO TABS
12.5000 mg | ORAL_TABLET | Freq: Two times a day (BID) | ORAL | Status: DC
  Administered 2023-06-07 – 2023-06-10 (×7): 12.5 mg via ORAL
  Filled 2023-06-06 (×7): qty 1

## 2023-06-06 MED ORDER — INSULIN GLARGINE-YFGN 100 UNIT/ML ~~LOC~~ SOLN
40.0000 [IU] | Freq: Two times a day (BID) | SUBCUTANEOUS | Status: DC
  Administered 2023-06-06 – 2023-06-09 (×6): 40 [IU] via SUBCUTANEOUS
  Filled 2023-06-06 (×6): qty 0.4

## 2023-06-06 MED ORDER — ACETAMINOPHEN 650 MG RE SUPP: 650.0000 mg | Freq: Four times a day (QID) | RECTAL | Status: DC | PRN

## 2023-06-06 MED ORDER — DM-GUAIFENESIN ER 30-600 MG PO TB12
1.0000 | ORAL_TABLET | Freq: Two times a day (BID) | ORAL | Status: DC
  Administered 2023-06-06 – 2023-06-10 (×8): 1 via ORAL
  Filled 2023-06-06 (×8): qty 1

## 2023-06-06 MED ORDER — ALBUTEROL SULFATE (2.5 MG/3ML) 0.083% IN NEBU
2.5000 mg | INHALATION_SOLUTION | Freq: Once | RESPIRATORY_TRACT | Status: AC
  Administered 2023-06-06: 2.5 mg via RESPIRATORY_TRACT
  Filled 2023-06-06: qty 3

## 2023-06-06 MED ORDER — ZOLPIDEM TARTRATE 5 MG PO TABS
5.0000 mg | ORAL_TABLET | Freq: Every evening | ORAL | Status: DC | PRN
  Administered 2023-06-08: 5 mg via ORAL
  Filled 2023-06-06: qty 1

## 2023-06-06 MED ORDER — INSULIN ASPART 100 UNIT/ML IJ SOLN
0.0000 [IU] | Freq: Every day | INTRAMUSCULAR | Status: DC
  Administered 2023-06-07 – 2023-06-08 (×2): 3 [IU] via SUBCUTANEOUS
  Administered 2023-06-09: 4 [IU] via SUBCUTANEOUS

## 2023-06-06 MED ORDER — AMIODARONE HCL 200 MG PO TABS
200.0000 mg | ORAL_TABLET | Freq: Two times a day (BID) | ORAL | Status: DC
Start: 2023-06-06 — End: 2023-06-10
  Administered 2023-06-06 – 2023-06-10 (×8): 200 mg via ORAL
  Filled 2023-06-06 (×8): qty 1

## 2023-06-06 MED ORDER — MIRABEGRON ER 25 MG PO TB24
50.0000 mg | ORAL_TABLET | Freq: Every day | ORAL | Status: DC
  Administered 2023-06-06 – 2023-06-10 (×5): 50 mg via ORAL
  Filled 2023-06-06 (×5): qty 2

## 2023-06-06 MED ORDER — SODIUM CHLORIDE 0.9 % IV SOLN
1.0000 g | Freq: Once | INTRAVENOUS | Status: AC
  Administered 2023-06-06: 1 g via INTRAVENOUS

## 2023-06-06 MED ORDER — PREDNISONE 20 MG PO TABS
40.0000 mg | ORAL_TABLET | Freq: Every day | ORAL | Status: DC
  Administered 2023-06-07 – 2023-06-10 (×4): 40 mg via ORAL
  Filled 2023-06-06 (×4): qty 2

## 2023-06-06 MED ORDER — SENNOSIDES-DOCUSATE SODIUM 8.6-50 MG PO TABS
1.0000 | ORAL_TABLET | Freq: Every evening | ORAL | Status: DC | PRN
Start: 2023-06-06 — End: 2023-06-10

## 2023-06-06 MED ORDER — MOMETASONE FURO-FORMOTEROL FUM 200-5 MCG/ACT IN AERO
2.0000 | INHALATION_SPRAY | Freq: Two times a day (BID) | RESPIRATORY_TRACT | Status: DC
Start: 2023-06-06 — End: 2023-06-10
  Administered 2023-06-07 – 2023-06-10 (×7): 2 via RESPIRATORY_TRACT
  Filled 2023-06-06: qty 8.8

## 2023-06-06 MED ORDER — CLOPIDOGREL BISULFATE 75 MG PO TABS
75.0000 mg | ORAL_TABLET | Freq: Every day | ORAL | Status: DC
  Administered 2023-06-07 – 2023-06-10 (×4): 75 mg via ORAL
  Filled 2023-06-06 (×4): qty 1

## 2023-06-06 MED ORDER — VANCOMYCIN HCL 1250 MG/250ML IV SOLN
1250.0000 mg | INTRAVENOUS | Status: DC
  Administered 2023-06-07 – 2023-06-08 (×2): 1250 mg via INTRAVENOUS
  Filled 2023-06-06 (×2): qty 250

## 2023-06-06 MED ORDER — FUROSEMIDE 10 MG/ML IJ SOLN
80.0000 mg | Freq: Every day | INTRAMUSCULAR | Status: DC
  Administered 2023-06-06 – 2023-06-09 (×4): 80 mg via INTRAVENOUS
  Filled 2023-06-06 (×4): qty 8

## 2023-06-06 MED ORDER — INSULIN ASPART 100 UNIT/ML IJ SOLN
0.0000 [IU] | Freq: Three times a day (TID) | INTRAMUSCULAR | Status: DC
  Administered 2023-06-07: 5 [IU] via SUBCUTANEOUS
  Administered 2023-06-07: 3 [IU] via SUBCUTANEOUS
  Administered 2023-06-07 – 2023-06-08 (×2): 8 [IU] via SUBCUTANEOUS
  Administered 2023-06-08: 5 [IU] via SUBCUTANEOUS
  Administered 2023-06-08: 8 [IU] via SUBCUTANEOUS
  Administered 2023-06-09 (×3): 5 [IU] via SUBCUTANEOUS
  Administered 2023-06-10: 8 [IU] via SUBCUTANEOUS

## 2023-06-06 MED ORDER — LATANOPROST 0.005 % OP SOLN
1.0000 [drp] | Freq: Every day | OPHTHALMIC | Status: DC
  Administered 2023-06-06 – 2023-06-09 (×4): 1 [drp] via OPHTHALMIC
  Filled 2023-06-06: qty 2.5

## 2023-06-06 MED ORDER — SODIUM CHLORIDE 0.9 % IV SOLN
2.0000 g | Freq: Two times a day (BID) | INTRAVENOUS | Status: DC
  Administered 2023-06-06 – 2023-06-08 (×4): 2 g via INTRAVENOUS
  Filled 2023-06-06 (×4): qty 12.5

## 2023-06-06 MED ORDER — ALBUTEROL SULFATE (2.5 MG/3ML) 0.083% IN NEBU: 2.5000 mg | INHALATION_SOLUTION | RESPIRATORY_TRACT | Status: DC | PRN

## 2023-06-06 MED ORDER — ISOSORBIDE MONONITRATE ER 30 MG PO TB24
30.0000 mg | ORAL_TABLET | Freq: Every day | ORAL | Status: DC
  Administered 2023-06-06 – 2023-06-10 (×5): 30 mg via ORAL
  Filled 2023-06-06 (×5): qty 1

## 2023-06-06 MED ORDER — IPRATROPIUM-ALBUTEROL 0.5-2.5 (3) MG/3ML IN SOLN
3.0000 mL | Freq: Once | RESPIRATORY_TRACT | Status: AC
  Administered 2023-06-06: 3 mL via RESPIRATORY_TRACT
  Filled 2023-06-06: qty 3

## 2023-06-06 MED ORDER — DONEPEZIL HCL 10 MG PO TABS
10.0000 mg | ORAL_TABLET | Freq: Every day | ORAL | Status: DC
  Administered 2023-06-06 – 2023-06-09 (×4): 10 mg via ORAL
  Filled 2023-06-06 (×4): qty 1

## 2023-06-06 MED ORDER — RIVAROXABAN 20 MG PO TABS
20.0000 mg | ORAL_TABLET | Freq: Every day | ORAL | Status: DC
  Administered 2023-06-06 – 2023-06-09 (×4): 20 mg via ORAL
  Filled 2023-06-06 (×4): qty 1

## 2023-06-06 MED ORDER — ACETAMINOPHEN 325 MG PO TABS
650.0000 mg | ORAL_TABLET | Freq: Four times a day (QID) | ORAL | Status: DC | PRN
  Administered 2023-06-06 – 2023-06-07 (×2): 650 mg via ORAL
  Filled 2023-06-06 (×2): qty 2

## 2023-06-06 MED ORDER — SERTRALINE HCL 50 MG PO TABS
50.0000 mg | ORAL_TABLET | ORAL | Status: DC
Start: 2023-06-07 — End: 2023-06-10
  Administered 2023-06-07 – 2023-06-10 (×4): 50 mg via ORAL
  Filled 2023-06-06 (×4): qty 1

## 2023-06-06 MED ORDER — PANTOPRAZOLE SODIUM 40 MG PO TBEC
40.0000 mg | DELAYED_RELEASE_TABLET | Freq: Every day | ORAL | Status: DC
  Administered 2023-06-06 – 2023-06-10 (×5): 40 mg via ORAL
  Filled 2023-06-06 (×5): qty 1

## 2023-06-06 MED ORDER — MONTELUKAST SODIUM 10 MG PO TABS
10.0000 mg | ORAL_TABLET | Freq: Every day | ORAL | Status: DC
Start: 2023-06-06 — End: 2023-06-10
  Administered 2023-06-06 – 2023-06-09 (×4): 10 mg via ORAL
  Filled 2023-06-06 (×4): qty 1

## 2023-06-06 MED ORDER — ATORVASTATIN CALCIUM 40 MG PO TABS
40.0000 mg | ORAL_TABLET | Freq: Every evening | ORAL | Status: DC
  Administered 2023-06-06 – 2023-06-09 (×4): 40 mg via ORAL
  Filled 2023-06-06 (×4): qty 1

## 2023-06-06 MED ORDER — MEMANTINE HCL 10 MG PO TABS
5.0000 mg | ORAL_TABLET | Freq: Two times a day (BID) | ORAL | Status: DC
  Administered 2023-06-06 – 2023-06-10 (×8): 5 mg via ORAL
  Filled 2023-06-06 (×8): qty 1

## 2023-06-06 MED ORDER — SPIRONOLACTONE 25 MG PO TABS
25.0000 mg | ORAL_TABLET | Freq: Every day | ORAL | Status: DC
  Administered 2023-06-07 – 2023-06-10 (×4): 25 mg via ORAL
  Filled 2023-06-06 (×4): qty 1

## 2023-06-06 NOTE — Progress Notes (Signed)
Sputum culture obtained, spontaneous cough, thick, pale, mod. Amts of secretions sent to lab with pt label and req.

## 2023-06-06 NOTE — Progress Notes (Signed)
Paged admissions about pt arrival

## 2023-06-06 NOTE — Progress Notes (Signed)
eLINK following for sepsis protocol. ?

## 2023-06-06 NOTE — ED Notes (Signed)
Hypoxic on room air, increased work of breathing noted, short statements.  Placed on 2LPM in ER. BBS rhonchi & wheezes, given SABA with releif.  Will order nebs for admission per protocol.  06/06/23 1504  Respiratory Severity Assessment  $ Protocol Assessment  Yes  Heart Rate 0  Breath Sounds 2  Respiratory Pattern 2  Cough 1  Chest X Ray 2  O2 Requirements/ Pulse Ox Sat(%) 1  Mental Status 0  Dyspnea 2  Score Total 10  Aerosolized Bronchodilators  Aerosolized bronchodilator indications Aerosolized bronchodilator indicated;Reversible obstructive disease (per PFT);COPD exacerbation/maintenance therapy;Bronchospasm/Wheezing  Medication Plan of Care Hand held neb treatment  Bronchial Hygiene  Bronchial Hygiene Plan of Care Cough & Deep breath  Oxygen Therapy  Oxygen therapy indications Oxygen therapy indicated;Hypoxemia;Hypoxia;SOB/Increased WOB;Increased myocardial O2 demand;SpO2 less than 92% or MD goal  Oxygen Plan of care Nasal cannula  Respiratory Therapy Follow Up Assessment  Assessment follow up date 06/07/23

## 2023-06-06 NOTE — H&P (Signed)
History and Physical    Patient: Mike Hunter XBM:[id number] DOB: Dec 16, 1950 DOA: 06/06/2023 DOS: the patient was seen and examined on 06/06/2023 PCP: Randel Pigg, Dorma Russell, MD  Patient coming from: [facility]  Chief Complaint:  Chief Complaint  Patient presents with   Shortness of Breath   Fever   Cough   HPI: Mike Hunter is a 72 y.o. male with medical history significant of moderate persistent asthma, T2DM, class II obesity, HLD, HTN, GERD, COPD on 1 L [place] at night, OSA on CPAP, AAA, left carotid stenosis s/p stent placement on 12/19, CKD 3A, paroxysmal A-fib on Xarelto, BPH, urge incontinence, bipolar disorder, anxiety and depression, episodic mood disorder, CAD, non-rheumatic aortic valve stenosis, osteoarthritis and chronic diastolic HF who presented to the [facility] ED for evaluation of cough, shortness of breath and wheezing.  Patient reports he had a stent placed in his left carotid on 1219.  States he had not been feeling well in the last 3 to 4 days but felt worse after discharge home yesterday. Last night he started having increased wheezing with associated coughing fits and shortness of breath. His CPAP machine was not working so he was unable to use it last night. His symptoms worsened this morning with some associated chest tightness and back pain with the increased coughing.  He endorsed mild left neck soreness and subjective fever but denies any abdominal pain, headache, dizziness, dysuria, vision changes or leg swelling.  En route, EMS noted patient with a fever and was given Tylenol prior to arrival in the ED.  ED course: Initial vitals with temp 101.8, RR 19, HR 86, BP 132/55, SpO2 94% on 2 L [place]. Labs show sodium 137, K+ 3.7, bicarb 20, glucose 186, creatinine 1.62, WBC 24.4, Hgb 10.6, platelet 337, acid 1.6, PT/INR 16.6/1.3, BNP 485, negative flu, COVID and RSV test, troponin 37->42, UA with significant glucosuria, trace hemoglobinuria but no signs of infection. CXR  shows opacity within the right midlung in the perihilar distribution concerning for pneumonia versus pulmonary edema, small right pleural effusion. EKG shows normal sinus rhythm Sepsis protocol was activated patient receive IV vancomycin and cefepime as well as Tylenol and DuoNebs Patient was admitted to [facility] service and transferred to [facility]  Review of Systems: As mentioned in the history of present illness. All other systems reviewed and are negative. 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 He has never used smokeless tobacco. He reports that he does not drink alcohol and does not use drugs.  He quit smoking tobacco more than 55 years ago.  Allergies  Allergen Reactions   Buprenorphine Hcl Nausea And Vomiting    Other reaction(s): Nausea And Vomiting    Hydromorphone Other (See Comments)  Other reaction(s): Mental Status Changes (intolerance) Other reaction(s): HALLUCINATIONS hallucinations hallucinations hallucinations hallucinations    Codeine Nausea And Vomiting   Mirtazapine     Other reaction(s): Other (See Comments) Nightmares & altered mental status    Morphine And Codeine Nausea And Vomiting    History reviewed. No pertinent family history.  Prior to Admission medications   Medication Sig Start Date End Date Taking? Authorizing Provider  albuterol (PROVENTIL HFA;VENTOLIN HFA) 108 (90 BASE) MCG/ACT  inhaler Inhale 2 puffs into the lungs every 4 (four) hours as needed for wheezing.   Yes [provider]  amiodarone (PACERONE) 200 MG tablet Take 200 mg by mouth 2 (two) times daily. 04/22/22  Yes [provider]  atorvastatin (LIPITOR) 40 MG tablet Take 40 mg by mouth every evening.    Yes [provider]  budesonide (PULMICORT) 0.5 MG/2ML nebulizer solution Take 2 mLs by nebulization 2 (two) times daily.   Yes [provider]  budesonide-formoterol (SYMBICORT) 160-4.5 MCG/ACT inhaler Inhale 2 puffs into the lungs 2 (two) times daily. 07/03/16  Yes [provider]  carvedilol (COREG) 6.25 MG tablet Take 12.5 mg by mouth 2 (two) times daily with a meal.    Yes [provider]  clopidogrel (PLAVIX) 75 MG tablet Take 1 tablet by mouth daily. 05/18/23 05/17/24 Yes [provider]  donepezil (ARICEPT) 10 MG tablet Take 10 mg by mouth at bedtime.   Yes [provider]  eszopiclone (LUNESTA) 2 MG TABS tablet Take 2 mg by mouth at bedtime as needed. 04/23/23  Yes [provider]  insulin aspart (NOVOLOG FLEXPEN) 100 UNIT/ML injection Inject 26-44 Units into the skin See admin instructions. 5 units prior to meals for cbg>= 150  Sliding scale (base is 26u in the AM, 30u at noon, and  36-44 at night   Yes [provider]  Insulin Degludec (TRESIBA FLEXTOUCH [place]) Inject 40-50 Units into the skin See admin instructions. 40 morning, 50 night   Yes [provider]  isosorbide mononitrate (IMDUR) 30 MG 24 hr tablet Take 1 tablet by mouth daily. 01/22/22  Yes [provider]  latanoprost (XALATAN) 0.005 % ophthalmic solution Place 1 drop into both eyes at bedtime.     Yes [provider]  memantine (NAMENDA) 5 MG tablet Take 5 mg by mouth 2 (two) times daily.   Yes [provider]  mirabegron ER (MYRBETRIQ) 50 MG TB24 tablet Take 1 tablet by mouth daily. 12/05/22 11/30/23 Yes [provider]   montelukast (SINGULAIR) 10 MG tablet Take 10 mg by mouth at bedtime.   Yes [provider]  omeprazole (PRILOSEC) 40 MG capsule Take 1 capsule by mouth at bedtime. 02/02/17  Yes [provider]  oxybutynin (DITROPAN-XL) 10 MG 24 hr tablet Take 10 mg by mouth daily.   Yes [provider]  sertraline (ZOLOFT) 50 MG tablet Take 50 mg by mouth every morning.    Yes [provider]  spironolactone (ALDACTONE) 25 MG tablet Take 25 mg by mouth daily.   Yes [provider]  torsemide (DEMADEX) 20 MG tablet Take 40 mg by mouth 2 (two) times daily.   Yes [provider]  [place] INHUB 250-50 MCG/ACT AEPB Inhale 1 puff into the lungs in the morning and at bedtime. 04/20/23  Yes [provider]  XARELTO 20 MG TABS tablet Take 20 mg by mouth daily with supper. 04/04/22  Yes [provider]    Physical Exam: Vitals:   06/06/23  1430 06/06/23 1530 06/06/23 1554 06/06/23 1649  BP: (!) 108/58 (!) 108/41  (!) 122/57  Pulse: 84 79  78  Resp: 18 (!) 25  16  Temp:   98.1 F (36.7 C) 98.7 F (37.1 C)  TempSrc:   Oral Oral  SpO2: 93% 97%  99%  Weight:    122 kg  Height:    5\' 11"  (1.803 m)   General: Obese elderly man laying comfortably in bed. No acute distress. HEENT: [place]/AT. Anicteric sclera Neck: Thick neck. JVD difficult to assess. CV: Regular rate. Regular rhythm with occasional extra beats. III/VI systolic murmur. Trace BLE edema Pulmonary: 2 L [place].  Lungs CTAB. Coarse breath sounds with decreased air movement. Mild expiratory wheezes diffusely. Crackles at the bases and mid lung. Abdominal: Soft, ND/NT. Mild abdominal wall edema. Normal bowel sounds. Extremities: Palpable radial and DP pulses. Normal ROM. Skin: Warm and dry. No obvious rash or lesions. Neuro: A&Ox3. Moves all extremities. Normal sensation to light touch. No focal deficit. Psych: Normal mood and affect  Data Reviewed: Labs show sodium 137, K+ 3.7, bicarb 20,  glucose 186, creatinine 1.62, WBC 24.4, Hgb 10.6, platelet 337, acid 1.6, PT/INR 16.6/1.3, BNP 485, negative flu, COVID and RSV test, troponin 37->42, UA with significant glucosuria, trace hemoglobinuria but no signs of infection. CXR shows opacity within the right midlung in the perihilar distribution concerning for pneumonia versus pulmonary edema, small right pleural effusion. EKG shows normal sinus rhythm  Assessment and Plan:  Erickson Kocian Ognibene is a 72 y.o. male with medical history significant of moderate persistent asthma, T2DM, class II obesity, HLD, HTN, GERD, COPD on 1 L [place] at night, OSA on CPAP, AAA, left carotid stenosis s/p stent placement on 12/19, CKD 3A, paroxysmal A-fib on Xarelto, BPH, urge incontinence, bipolar disorder, anxiety and depression, episodic mood disorder, CAD, non-rheumatic aortic valve stenosis, osteoarthritis and chronic diastolic HF who presented to the [facility] ED for evaluation of cough, shortness of breath and wheezing and admitted for sepsis secondary to pneumonia.  # Sepsis # HCAP Patient with a history of chronic lung disease and a recent hospital encounter presenting with cough, fever and wheezing found to be hypoxic on admission. Meets sepsis criteria with fever, mils tachycardia, tachypnea, significant leukocytosis and evidence of likely pneumonia on imaging. Lactic acid within normal limits. Flu, RSV and COVID-negative. IV Vanc and cefepime initiated in the ED. Hemodynamically stable but remains on 2 L [place]. -Continue IV Vanc and cefepime -Mucinex DM twice daily for cough -Check MRSA screen -Follow-up blood culture, sputum culture -Check full RVP, urine Legionella and strep pneumo -Follow-up a.m. procalcitonin -Trend CBC, fever curve  # Asthma/COPD with acute exacerbation Presents with increased wheezing, shortness of breath and cough. On 1 L [place] at night but now requiring 2 L. He continues to have diffuse expiratory wheezing on exam. -Prednisone  40 mg daily for 5 days -Dulera 2 puffs twice daily -Continue DuoNebs and Singulair -Wean O2 as able  # Acute on chronic HFpEF # Nonrheumatic aortic valve stenosis Stress echo on 12/11 showed resting TTE with EF 55-60%, and moderate aortic stenosis. Patient presenting today with increased shortness of breath with new O2 requirement. BNP elevated to 400 and chest x-ray concerning for possible pulmonary edema versus pneumonia. Patient slightly fluid overloaded on exam. -IV Lasix 80 mg x 1 today, reassess response tomorrow and repeat as needed -Continue spironolactone and Coreg -Continue supplemental O2 -Strict I&O's, daily weights -Trend and replete electrolytes -Telemetry  #  T2DM Last A1c 7.9% 2 days ago. Home regimen includes Tresiba 40 units in the morning and 50 units at night with sliding scale NovoLog. Blood sugar in the 180s to 190s. -Start Semglee 40 units twice daily, adjust as needed -SSI with meals, CBG monitoring -Follow-up with endocrinology in the outpatient  # HTN BP stable with SBP in the 110s to 130s. DBP slightly low in the 40s to 50s. -Continue Imdur, spironolactone and Coreg  # A-fib on Xarelto Rate and rhythm controlled on amiodarone and Coreg.  Remains in normal sinus with heart rate in the 70s to 90s. -Continue Xarelto, Coreg and amiodarone -Telemetry  # Left carotid stenosis Had stenting of his left carotic artery stenosis on 12/19 at [facility]. Reports mild neck soreness but no dizziness or headache. -Continue Plavix and Xarelto -Follow-up with vascular surgery in the outpatient  # CAD with angina pectoris # HLD # AAA Patient had a positive dobutamine stress echo on 12/11. He reports intermittent chest pain. No chest pain at the moment. Troponins are flat. EKG without any ischemic changes. Lipid panel 3 months ago showed LDL of 40. -Continue Plavix, Imdur, atorvastatin and Xarelto -Follow-up with cardiology in the outpatient  # CKD stage 3A Baseline  creatinine around 1.4-1.6. Creatinine on admission around baseline. -Avoid nephrotoxic's agents  # BPH # Urge incontinence -Continue Myrbetriq and oxybutynin  # OSA -CPAP at bedtime  # Cognitive deficits # Mild dementia -Continue memantine and Donepezil  # Anxiety and depression -Continue sertraline  # GERD -Protonix  # Class II obesity Body mass index is 37.51 kg/m. -Follow-up with PCP for nutrition counseling   Advance Care Planning:   Code Status: Full Code   Consults: None  Family Communication: No family at bedside  Severity of Illness: The appropriate patient status for this patient is INPATIENT. Inpatient status is judged to be reasonable and necessary in order to provide the required intensity of service to ensure the patient's safety. The patient's presenting symptoms, physical exam findings, and initial radiographic and laboratory data in the context of their chronic comorbidities is felt to place them at high risk for further clinical deterioration. Furthermore, it is not anticipated that the patient will be medically stable for discharge from the hospital within 2 midnights of admission.   * I certify that at the point of admission it is my clinical judgment that the patient will require inpatient hospital care spanning beyond 2 midnights from the point of admission due to high intensity of service, high risk for further deterioration and high frequency of surveillance required.*  Author: Steffanie Rainwater, MD 06/06/2023 7:36 PM  For on call review [email or web address].

## 2023-06-06 NOTE — ED Triage Notes (Signed)
The patient had a stent placed last week. He got DC from the hospital yesterday. Last night his Cpap was not working. He has had increased shortness of breath and cough. Fever in triage. Placed on 02 2 lpm.

## 2023-06-06 NOTE — Progress Notes (Signed)
ED Pharmacy Antibiotic Sign Off An antibiotic consult was received from an ED provider for vancomycin per pharmacy dosing for pneumonia. A chart review was completed to assess appropriateness.  A single dose of cefepime placed by the ED provider.   The following one time order(s) were placed per pharmacy consult:  vancomycin 2000 mg x 1 dose MRSA PCR  Further antibiotic and/or antibiotic pharmacy consults should be ordered by the admitting provider if indicated.   Thank you for allowing pharmacy to be a part of this patient's care.   Delmar Landau, PharmD, BCPS 06/06/2023 12:12 PM ED Clinical Pharmacist -  [phone]

## 2023-06-06 NOTE — Progress Notes (Signed)
Pharmacy Antibiotic Note  Mike Hunter is a 72 y.o. male admitted on 06/06/2023 with sepsis/HCAP.  Pharmacy has been consulted for vancomycin and cefepime dosing.  Plan: Cefepime 2 gr IV q12h  Vancomycin 2000 mg IV x1 given in ED then vancomycin 1250 mg IV q24h ( AUC 515, SCr 1.62 mg/dl, wt 161 kg)  Monitor clinical course, renal function, cultures as available   Height: 5\' 11"  (180.3 cm) Weight: 122 kg (268 lb 15.4 oz) IBW/kg (Calculated) : 75.3  Temp (24hrs), Avg:99.5 F (37.5 C), Min:98.1 F (36.7 C), Max:101.8 F (38.8 C)  Recent Labs  Lab 06/06/23 1218  WBC 24.4*  CREATININE 1.62*  LATICACIDVEN 1.6    Estimated Creatinine Clearance: 53.2 mL/min (A) (by C-G formula based on SCr of 1.62 mg/dL (H)).    Allergies  Allergen Reactions   Buprenorphine Hcl Nausea And Vomiting    Other reaction(s): Nausea And Vomiting    Hydromorphone Other (See Comments)    Other reaction(s): Mental Status Changes (intolerance) Other reaction(s): HALLUCINATIONS hallucinations hallucinations hallucinations hallucinations    Codeine Nausea And Vomiting   Mirtazapine     Other reaction(s): Other (See Comments) Nightmares & altered mental status    Morphine And Codeine Nausea And Vomiting    Antimicrobials this admission: 12/21 cefepime >>  12/21 vancomycin >>   Dose adjustments this admission:   Microbiology results: 12/21 BCx:  12/21 resp panel:  Legionella antigen Strep pneumo antigen: Resp panel:   MRSA PCR:      [name], PharmD, BCPS 06/06/2023 9:24 PM

## 2023-06-06 NOTE — ED Notes (Signed)
   06/06/23 1140  Respiratory Assessment  $ RT Protocol Assessment  Yes  Assessment Type Pre-treatment  Respiratory Pattern Regular;Labored;Dyspnea at rest;Symmetrical  Chest Assessment Chest expansion symmetrical  Cough Congested  Bilateral Breath Sounds Expiratory wheezes  R Lower Breath Sounds Rhonchi  Oxygen Therapy/Pulse Ox  O2 Device Nasal Cannula  O2 Therapy Oxygen  O2 Flow Rate (L/min) 2 L/min     06/06/23 1140  Respiratory Assessment  $ RT Protocol Assessment  Yes  Assessment Type Pre-treatment  Respiratory Pattern Regular;Labored;Dyspnea at rest;Symmetrical  Chest Assessment Chest expansion symmetrical  Cough Congested  Bilateral Breath Sounds Expiratory wheezes  R Lower Breath Sounds Rhonchi  Oxygen Therapy/Pulse Ox  O2 Device Nasal Cannula  O2 Therapy Oxygen  O2 Flow Rate (L/min) 2 L/min   Seen before triage, SpO2 89-90%, speaking in 2 word statements, place on 2lpm [place] in lobby, BBS faint wheeze, rhonmchi RLL, congested cough. Triage and charge nurse aware of patient.

## 2023-06-06 NOTE — ED Provider Notes (Signed)
[facility] Provider Note   CSN: [id number] Arrival date & time: 06/06/23  1133     History  Chief Complaint  Patient presents with   Shortness of Breath   Fever   Cough    Mike Hunter is a 72 y.o. male.   Shortness of Breath Associated symptoms: cough and fever   Fever Associated symptoms: cough   Cough Associated symptoms: fever and shortness of breath     72 year old male presents emergency department with complaints of cough, shortness of breath, wheeze.  Patient reports noticing symptoms for the past week or so.  Had stent placed in his carotid artery 2 days ago 06/04/2023 through [facility].  States that he spent 2 days in the hospital then.  Reports worsening of symptoms last night into this morning.  Did not have a CPAP last night so his breathing was more labored.  Report worsening shortness of breath as well as cough and wheezing this morning prompting visit to the emergency department.  Patient denies any history of fever but febrile upon presentation with a temp of 101.8.  Reports some feelings of chest tightness that is been present constantly for the past 7 several days.  Patient also with history of CHF and feels like he may be retaining more fluid than usual.  Denies any abdominal pain, nausea vomiting, urinary symptoms, change in bowel habits.  Denies O2 use at baseline during the day but does use 1 L at night. Who Past medical history significant for CHF,, CKD 3 AA, abdominal aortic aneurysm, nonrheumatic aortic valve stenosis, left carotid artery stenosis with surgical intervention 06/04/2023 with stent placement, COPD, diabetes mellitus, OSA, paroxysmal afi on xarelto, positive cardiac stress test on 05/27/2023  Home Medications Prior to Admission medications   Medication Sig Start Date End Date Taking? Authorizing Provider  albuterol (PROVENTIL HFA;VENTOLIN HFA) 108 (90 BASE) MCG/ACT inhaler Inhale 2 puffs into  the lungs every 4 (four) hours as needed for wheezing.    [provider]  atorvastatin (LIPITOR) 40 MG tablet Take 40 mg by mouth every evening.     [provider]  budesonide-formoterol (SYMBICORT) 160-4.5 MCG/ACT inhaler Inhale 2 puffs into the lungs 2 (two) times daily. 07/03/16   [provider]  carvedilol (COREG) 6.25 MG tablet Take 12.5 mg by mouth 2 (two) times daily with a meal.     [provider]  diclofenac Sodium (VOLTAREN) 1 % GEL Apply 2 g topically 4 (four) times daily as needed. 09/13/19   Farrel Gordon, PA-C  donepezil (ARICEPT) 10 MG tablet Take 10 mg by mouth at bedtime.    [provider]  insulin aspart (NOVOLOG FLEXPEN) 100 UNIT/ML injection Inject 26-44 Units into the skin See admin instructions. 5 units prior to meals for cbg>= 150  Sliding scale (base is 26u in the AM, 30u at noon, and  36-44 at night    [provider]  Insulin Degludec (TRESIBA FLEXTOUCH [place]) Inject 65 Units into the skin 2 (two) times daily.    [provider]  latanoprost (XALATAN) 0.005 % ophthalmic solution Place 1 drop into both eyes at bedtime.      [provider]  memantine (NAMENDA) 5 MG tablet Take 5 mg by mouth 2 (two) times daily.    [provider]  montelukast (SINGULAIR) 10 MG tablet Take 10 mg by mouth at bedtime.    [provider]  potassium chloride SA (K-DUR,KLOR-CON) 20 MEQ  tablet Take 20 mEq by mouth 3 (three) times daily. 09/24/18   [provider]  sertraline (ZOLOFT) 50 MG tablet Take 50 mg by mouth every morning.     [provider]  torsemide (DEMADEX) 20 MG tablet Take 40 mg by mouth 2 (two) times daily.    [provider]      Allergies    Buprenorphine hcl, Hydromorphone, Codeine, Mirtazapine, and Morphine and codeine    Review of Systems   Review of Systems  Constitutional:  Positive for fever.  Respiratory:  Positive for cough and shortness of breath.    All other systems reviewed and are negative.   Physical Exam Updated Vital Signs BP (!) 108/41   Pulse 79   Temp 98.1 F (36.7 C) (Oral)   Resp (!) 25   Ht 5\' 11"  (1.803 m)   Wt 136 kg   SpO2 97%   BMI 41.82 kg/m  Physical Exam Vitals and nursing note reviewed.  Constitutional:      General: He is in acute distress.     Appearance: He is well-developed.  HENT:     Head: Normocephalic and atraumatic.  Eyes:     Conjunctiva/sclera: Conjunctivae normal.  Cardiovascular:     Rate and Rhythm: Normal rate and regular rhythm.     Heart sounds: Murmur heard.  Pulmonary:     Effort: Tachypnea and respiratory distress present.     Breath sounds: No stridor.     Comments: Coarse breath sounds noted bilaterally.  Wheeze, rhonchi noted bilateral lung fields diffusely.  Increased work of breathing. Abdominal:     Palpations: Abdomen is soft.     Tenderness: There is no abdominal tenderness. There is no guarding.  Musculoskeletal:     Cervical back: Neck supple.     Comments: 1 pitting edema bilateral lower extremities.  Skin:    General: Skin is warm and dry.     Capillary Refill: Capillary refill takes less than 2 seconds.  Neurological:     Mental Status: He is alert.  Psychiatric:        Mood and Affect: Mood normal.     ED Results / Procedures / Treatments   Labs (all labs ordered are listed, but only abnormal results are displayed) Labs Reviewed  COMPREHENSIVE METABOLIC PANEL - Abnormal; Notable for the following components:      Result Value   CO2 20 (*)    Glucose, Bld 186 (*)    Creatinine, Ser 1.62 (*)    Calcium 8.6 (*)    Albumin 3.2 (*)    Total Bilirubin 1.2 (*)    GFR, Estimated 44 (*)    All other components within normal limits  CBC WITH DIFFERENTIAL/PLATELET - Abnormal; Notable for the following components:   WBC 24.4 (*)    Hemoglobin 10.6 (*)    HCT 35.4 (*)    MCV 77.1 (*)    MCH 23.1 (*)    MCHC 29.9 (*)    RDW 16.7 (*)    Neutro Abs 22.0  (*)    Lymphs Abs 0.6 (*)    Monocytes Absolute 1.6 (*)    Abs Immature Granulocytes 0.19 (*)    All other components within normal limits  PROTIME-INR - Abnormal; Notable for the following components:   Prothrombin Time 16.6 (*)    INR 1.3 (*)    All other components within normal limits  URINALYSIS, W/ REFLEX TO CULTURE (INFECTION SUSPECTED) - Abnormal; Notable for the following components:  Glucose, UA >=500 (*)    Hgb urine dipstick TRACE (*)    Bacteria, UA RARE (*)    All other components within normal limits  APTT - Abnormal; Notable for the following components:   aPTT 43 (*)    All other components within normal limits  BRAIN NATRIURETIC PEPTIDE - Abnormal; Notable for the following components:   B Natriuretic Peptide 485.2 (*)    All other components within normal limits  TROPONIN I (HIGH SENSITIVITY) - Abnormal; Notable for the following components:   Troponin I (High Sensitivity) 37 (*)    All other components within normal limits  RESP PANEL BY RT-PCR (RSV, FLU A&B, COVID)  RVPGX2  CULTURE, BLOOD (ROUTINE X 2)  CULTURE, BLOOD (ROUTINE X 2)  LACTIC ACID, PLASMA  TROPONIN I (HIGH SENSITIVITY)    EKG EKG Interpretation Date/Time:  Saturday June 06 2023 12:00:07 EST Ventricular Rate:  87 PR Interval:  161 QRS Duration:  101 QT Interval:  382 QTC Calculation: 460 R Axis:   108  Text Interpretation: Sinus rhythm Right axis deviation Confirmed by Virgina Norfolk [id number]) on 06/06/2023 12:02:34 PM  Radiology No results found.  Procedures .Critical Care  Performed by: Peter Garter, PA Authorized by: Peter Garter, PA   Critical care provider statement:    Critical care time (minutes):  61   Critical care was necessary to treat or prevent imminent or life-threatening deterioration of the following conditions:  Respiratory failure and sepsis   Critical care was time spent personally by me on the following activities:  Development of treatment plan  with patient or surrogate, discussions with consultants, evaluation of patient's response to treatment, examination of patient, ordering and review of laboratory studies, ordering and review of radiographic studies, ordering and performing treatments and interventions, pulse oximetry, re-evaluation of patient's condition and review of old charts   I assumed direction of critical care for this patient from another provider in my specialty: no     Care discussed with: admitting provider       Medications Ordered in ED Medications  vancomycin (VANCOCIN) IVPB 1000 mg/200 mL premix (0 mg Intravenous Stopped 06/06/23 1515)    Followed by  vancomycin (VANCOCIN) IVPB 1000 mg/200 mL premix (1,000 mg Intravenous New Bag/Given 06/06/23 1528)  ipratropium-albuterol (DUONEB) 0.5-2.5 (3) MG/3ML nebulizer solution 3 mL (has no administration in time range)  albuterol (PROVENTIL) (2.5 MG/3ML) 0.083% nebulizer solution 2.5 mg (has no administration in time range)  acetaminophen (TYLENOL) tablet 1,000 mg (1,000 mg Oral Given 06/06/23 1215)  ceFEPIme (MAXIPIME) 1 g in sodium chloride 0.9 % 100 mL IVPB (0 g Intravenous Stopped 06/06/23 1514)  ipratropium-albuterol (DUONEB) 0.5-2.5 (3) MG/3ML nebulizer solution 3 mL (3 mLs Nebulization Given 06/06/23 1224)  albuterol (PROVENTIL) (2.5 MG/3ML) 0.083% nebulizer solution 2.5 mg (2.5 mg Nebulization Given 06/06/23 1224)    ED Course/ Medical Decision Making/ A&P Clinical Course as of 06/06/23 1602  Sat Jun 06, 2023  1338 DG Chest 2 View Chest x-ray interpreted by and has radiologist Dr. Nani Ravens: "mild edema suspected with trace pleural fluid (thickening of the fissures on the lat projection.  asym opacification within the right mid and right lower lung c/w pna vs. asym edema." [CR]  1419 Consulted Dr. Erenest Blank of hospitalist who agreed with admission and assume further treatment/care. [CR]    Clinical Course User Index [CR] Peter Garter, PA  Medical Decision Making Amount and/or Complexity of Data Reviewed Labs: ordered. Radiology: ordered. Decision-making details documented in ED Course.  Risk OTC drugs. Prescription drug management. Decision regarding hospitalization.   This patient presents to the ED for concern of shortness of breath, this involves an extensive number of treatment options, and is a complaint that carries with it a high risk of complications and morbidity.  The differential diagnosis includes CHF, ACS, pneumonia, flu, COVID, RSV, pneumothorax, anemia, other   Co morbidities that complicate the patient evaluation  See HPI   Additional history obtained:  Additional history obtained from EMR External records from outside source obtained and reviewed including hospital records   Lab Tests:  I Ordered, and personally interpreted labs.  The pertinent results include: Cytosis of 24.4 with left shift.  Anemia with hemoglobin of 10.6 of which is microcytic in nature.  Platelets within range.  Mild hypocalcemia of 86 but otherwise, chest within normal limits.  No transaminitis.  Patient with baseline renal dysfunction with creatinine 1.62, BUN of 22 and GFR 44.  Troponin of 27.  BNP elevated 45.2.  UA with rare bacteria but otherwise with hemoglobin, glucose.  Otherwise unremarkable.   Imaging Studies ordered:  I ordered imaging studies including chest. I independently visualized and interpreted imaging which showed mild anemia with suspected trace pleural effusion.  Opacification right middle and right lower lung concerning for pneumonia versus asymmetric edema. I agree with the radiologist interpretation   Cardiac Monitoring: / EKG:  The patient was maintained on a cardiac monitor.  I personally viewed and interpreted the cardiac monitored which showed an underlying rhythm of: Sinus rhythm.  Right axis deviation.   Consultations Obtained:  See ED course  Problem List / ED Course /  Critical interventions / Medication management  Sepsis, pneumonia I ordered medication including cefepime, vancomycin, Tylenol, DuoNeb, febrile   Reevaluation of the patient after these medicines showed that the patient improved I have reviewed the patients home medicines and have made adjustments as needed   Social Determinants of Health:  Former cigarette use.  Denies illicit drug use.   Test / Admission - Considered:  Sepsis, pneumonia Vitals signs significant for initial fever with an oral temp of 101.8.  Tachycardia as well as tachypnea. Otherwise within normal range and stable throughout visit. Laboratory/imaging studies significant for: See above 72 year old male presents emergency department with complaints of cough, shortness of breath, wheezing.  Has been having symptoms for the past week or so with acute worsening yesterday into today.  On initial exam, patient found with hypoxia with an oxygen saturation of 90% on room air while lying in bed.  Was initially placed on 2 L.  Patient without O2 use during the day at baseline.  Diffuse wheezing also appreciated bilateral lung fields.  Given patient's fever, tachypnea and worsening cough, sepsis protocol was initially began with broad-spectrum antibiotics following hospital-acquired pneumonia given recent hospitalization couple days ago.  Labs concerning for pretty significant leukocytosis elevated troponin of 37 most likely secondary to type II NSTEMI given patient's hypoxic state; will trend.  Chest x-ray obtained which did show evidence of right-sided pneumonia.  Will pursue admission through hospitalist.  Treatment plan discussed at length with patient and family and they knowledge understanding were agreeable to said plan.  Patient stable upon admission.        Final Clinical Impression(s) / ED Diagnoses Final diagnoses:  Sepsis, due to unspecified organism, unspecified whether acute organ dysfunction present (HCC)  Pneumonia  of  right lung due to infectious organism, unspecified part of lung    Rx / DC Orders ED Discharge Orders     None         Peter Garter, [place] 06/06/23 1602    Virgina Norfolk, DO 06/07/23 [phone]

## 2023-06-06 NOTE — ED Notes (Signed)
Called [facility] for transport 

## 2023-06-07 DIAGNOSIS — J4541 Moderate persistent asthma with (acute) exacerbation: Secondary | ICD-10-CM

## 2023-06-07 DIAGNOSIS — J441 Chronic obstructive pulmonary disease with (acute) exacerbation: Secondary | ICD-10-CM

## 2023-06-07 DIAGNOSIS — A419 Sepsis, unspecified organism: Secondary | ICD-10-CM | POA: Diagnosis not present

## 2023-06-07 DIAGNOSIS — J189 Pneumonia, unspecified organism: Secondary | ICD-10-CM

## 2023-06-07 LAB — BASIC METABOLIC PANEL
Anion gap: 10 (ref 5–15)
BUN: 25 mg/dL — ABNORMAL HIGH (ref 8–23)
CO2: 21 mmol/L — ABNORMAL LOW (ref 22–32)
Calcium: 8.3 mg/dL — ABNORMAL LOW (ref 8.9–10.3)
Chloride: 107 mmol/L (ref 98–111)
Creatinine, Ser: 1.49 mg/dL — ABNORMAL HIGH (ref 0.61–1.24)
GFR, Estimated: 49 mL/min — ABNORMAL LOW (ref >60)
Glucose, Bld: 198 mg/dL — ABNORMAL HIGH (ref 70–99)
Potassium: 3.4 mmol/L — ABNORMAL LOW (ref 3.5–5.1)
Sodium: 138 mmol/L (ref 135–145)

## 2023-06-07 LAB — CBC
HCT: 32.5 % — ABNORMAL LOW (ref 39.0–52.0)
Hemoglobin: 9.5 g/dL — ABNORMAL LOW (ref 13.0–17.0)
MCH: 23.5 pg — ABNORMAL LOW (ref 26.0–34.0)
MCHC: 29.2 g/dL — ABNORMAL LOW (ref 30.0–36.0)
MCV: 80.4 fL (ref 80.0–100.0)
Platelets: 288 10*3/uL (ref 150–400)
RBC: 4.04 MIL/uL — ABNORMAL LOW (ref 4.22–5.81)
RDW: 16.7 % — ABNORMAL HIGH (ref 11.5–15.5)
WBC: 17.8 10*3/uL — ABNORMAL HIGH (ref 4.0–10.5)
nRBC: 0 % (ref 0.0–0.2)

## 2023-06-07 LAB — RESPIRATORY PANEL BY PCR

## 2023-06-07 LAB — EXPECTORATED SPUTUM ASSESSMENT W GRAM STAIN, RFLX TO RESP C

## 2023-06-07 LAB — PHOSPHORUS: Phosphorus: 2.6 mg/dL (ref 2.5–4.6)

## 2023-06-07 LAB — GLUCOSE, CAPILLARY
Glucose-Capillary: 160 mg/dL — ABNORMAL HIGH (ref 70–99)
Glucose-Capillary: 225 mg/dL — ABNORMAL HIGH (ref 70–99)
Glucose-Capillary: 261 mg/dL — ABNORMAL HIGH (ref 70–99)
Glucose-Capillary: 270 mg/dL — ABNORMAL HIGH (ref 70–99)

## 2023-06-07 LAB — PROCALCITONIN: Procalcitonin: 0.86 ng/mL

## 2023-06-07 LAB — MAGNESIUM: Magnesium: 2.4 mg/dL (ref 1.7–2.4)

## 2023-06-07 LAB — STREP PNEUMONIAE URINARY ANTIGEN: Strep Pneumo Urinary Antigen: NEGATIVE

## 2023-06-07 MED ORDER — POTASSIUM CHLORIDE 20 MEQ PO PACK
40.0000 meq | PACK | Freq: Once | ORAL | Status: AC
  Administered 2023-06-07: 40 meq via ORAL
  Filled 2023-06-07: qty 2

## 2023-06-07 NOTE — Progress Notes (Signed)
PROGRESS NOTE    Mike Hunter  ZOX:[id number] DOB: 1950-12-24 DOA: 06/06/2023 PCP: Lenox Ponds, MD   Brief Narrative:  This 72 yrs old male with PMH significant for moderate persistent asthma, DM II, class II obesity, HLD, HTN, GERD, COPD on 1 L [place] at night, OSA on CPAP, AAA, left carotid stenosis s/p stent placement on 12/19, CKD 3A, paroxysmal A-fib on Xarelto, BPH, urge incontinence, bipolar disorder, anxiety and depression, episodic mood disorder, CAD, non-rheumatic aortic valve stenosis, osteoarthritis and chronic diastolic HF presented in the ED for the complaints of cough and shortness of breath.He is found to have wheezing on exam. His CPAP machine was not working so he was unable to use it last night. His symptoms worsened this morning with some associated chest tightness and back pain with the increased coughing.  Workup in the ED chest x-ray shows opacity within the right midlung concerning for pneumonia.  Patient was started on empiric antibiotics for sepsis secondary to pneumonia.  He is admitted for further evaluation.  Assessment & Plan:   Principal Problem:   Sepsis due to pneumonia [facility]) Active Problems:   Acute on chronic diastolic CHF (congestive heart failure) (HCC)   Moderate persistent asthma with exacerbation   COPD with acute exacerbation (HCC)   Pneumonia of right lung due to infectious organism   Sepsis secondary to HCAP: Patient with history of chronic lung disease and recent hospitalization presented with cough, fever and wheezing. Met sepsis criteria with fever, tachycardia, tachypnea, leukocytosis, likely evidence of pneumonia on imaging. Lactic acid normal, flu, RSV and COVID-negative. Patient initiated on emperic antibiotics (IV vancomycin and cefepime). Continue supplemental oxygen and wean as tolerated. Follow-up blood and sputum culture. Follow up Legionella, strep pneumo antigen.   COPD exacerbation: Patient presented with increased wheezing,  shortness of breath, cough. He uses oxygen as needed at night,  now requiring 2 L of supplemental oxygen. Continue Prednisone 40 mg daily for 5 days Continue Dulera 2 puffs twice daily Continue DuoNebs and Singulair Wean O2 as able   Acute on chronic HFpEF: Non rheumatic aortic valve stenosis: Stress echo on 12/11 showed resting TTE with EF 55-60%, and moderate aortic stenosis.  Patient presenting today with increased shortness of breath with new O2 requirement.  BNP elevated to 400 and chest x-ray concerning for possible pulmonary edema versus pneumonia.  Patient was given IV Lasix 80 mg once yesterday. Continue spironolactone and Coreg Continue supplemental O2 Strict I&O's, daily weights Trend and replete electrolytes  DM II: Last HbA1c 7.9% 2 days ago.  Continue Semglee 40 units twice daily, adjust as needed SSI with meals, CBG monitoring Follow-up with endocrinology in the outpatient   Essential HTN: Continue Imdur, spironolactone and Coreg   A-fib on Xarelto: Rate and rhythm controlled on amiodarone and Coreg.   Remains in normal sinus with heart rate in the 70s to 90s. Continue Xarelto, Coreg and amiodarone  Left carotid stenosis: Had stenting of his left carotic artery stenosis on 12/19 at [facility]. Reports mild neck soreness but no dizziness or headache. Continue Plavix and Xarelto Follow-up with vascular surgery in the outpatient   CAD with angina pectoris: AAA: Patient had a positive dobutamine stress echo on 12/11. He reports intermittent chest pain. No chest pain at the moment.  Troponins are flat. EKG without any ischemic changes. Lipid panel 3 months ago showed LDL of 40. Continue Plavix, Imdur, atorvastatin and Xarelto Follow-up with cardiology in the outpatient  CKD stage 3A: Baseline creatinine around 1.4-1.6.  Creatinine on admission around baseline. Avoid nephrotoxic's agents   BPH: Urge incontinence: Continue Myrbetriq and oxybutynin   OSA: CPAP  at bedtime  Cognitive deficits: Mild dementia: Continue memantine and Donepezil   Anxiety and depression: Continue sertraline.   GERD: Continue Protonix.   # Class II obesity Body mass index is 37.51 kg/m. -Follow-up with PCP for nutrition counseling   DVT prophylaxis:  Heparin Code Status: Full code Family Communication: No family at bed side Disposition Plan:    Status is: Inpatient Remains inpatient appropriate because: Admitted for sepsis secondary to HCAP and COPD exacerbation.   Consultants:  None  Procedures:None  Antimicrobials:  Anti-infectives (From admission, onward)    Start     Dose/Rate Route Frequency Ordered Stop   06/07/23 1000  vancomycin (VANCOREADY) IVPB 1250 mg/250 mL        1,250 mg 166.7 mL/hr over 90 Minutes Intravenous Every 24 hours 06/06/23 2124     06/06/23 2200  ceFEPIme (MAXIPIME) 2 g in sodium chloride 0.9 % 100 mL IVPB        2 g 200 mL/hr over 30 Minutes Intravenous Every 12 hours 06/06/23 2124     06/06/23 1330  vancomycin (VANCOCIN) IVPB 1000 mg/200 mL premix       Placed in "Followed by" Linked Group   1,000 mg 200 mL/hr over 60 Minutes Intravenous  Once 06/06/23 1212 06/06/23 1628   06/06/23 1215  ceFEPIme (MAXIPIME) 1 g in sodium chloride 0.9 % 100 mL IVPB        1 g 200 mL/hr over 30 Minutes Intravenous  Once 06/06/23 1209 06/06/23 1514   06/06/23 1215  vancomycin (VANCOCIN) IVPB 1000 mg/200 mL premix       Placed in "Followed by" Linked Group   1,000 mg 200 mL/hr over 60 Minutes Intravenous  Once 06/06/23 1212 06/06/23 1515      Subjective: Patient was seen and examined at bedside.  Overnight events noted.  Patient reports doing better. He remains on 2 L of supplemental oxygen.  Objective: Vitals:   06/07/23 0500 06/07/23 0607 06/07/23 0750 06/07/23 0924  BP:  (!) 142/64  (!) 138/57  Pulse:  77  85  Resp:  20  20  Temp:  98.2 F (36.8 C)  98.6 F (37 C)  TempSrc:  Oral  Oral  SpO2:  95% 97% 98%  Weight:  122.4 kg     Height:        Intake/Output Summary (Last 24 hours) at 06/07/2023 1240 Last data filed at 06/07/2023 1200 Gross per 24 hour  Intake 740 ml  Output 3150 ml  Net -2410 ml   Filed Weights   06/06/23 1150 06/06/23 1649 06/07/23 0500  Weight: 136 kg 122 kg 122.4 kg    Examination:  General exam: Appears calm and comfortable, deconditioned, not in any acute distress. Respiratory system: CTA bilaterally. Respiratory effort normal.  RR 16 Cardiovascular system: S1 & S2 heard, regular rate and rhythm, murmur+ Gastrointestinal system: Abdomen is nondistended, soft and nontender.  Normal bowel sounds heard. Central nervous system: Alert and oriented x 3. No focal neurological deficits. Extremities: No edema, no cyanosis, no clubbing Skin: No rashes, lesions or ulcers Psychiatry: Judgement and insight appear normal. Mood & affect appropriate.     Data Reviewed: I have personally reviewed following labs and imaging studies  CBC: Recent Labs  Lab 06/06/23 1218 06/07/23 0427  WBC 24.4* 17.8*  NEUTROABS 22.0*  --   HGB 10.6* 9.5*  HCT 35.4*  32.5*  MCV 77.1* 80.4  PLT 337 288   Basic Metabolic Panel: Recent Labs  Lab 06/06/23 1218 06/07/23 0427  NA 137 138  K 3.7 3.4*  CL 107 107  CO2 20* 21*  GLUCOSE 186* 198*  BUN 22 25*  CREATININE 1.62* 1.49*  CALCIUM 8.6* 8.3*  MG  --  2.4  PHOS  --  2.6   GFR: Estimated Creatinine Clearance: 57.9 mL/min (A) (by C-G formula based on SCr of 1.49 mg/dL (H)). Liver Function Tests: Recent Labs  Lab 06/06/23 1218  AST 29  ALT 41  ALKPHOS 84  BILITOT 1.2*  PROT 7.5  ALBUMIN 3.2*   No results for input(s): "LIPASE", "AMYLASE" in the last 168 hours. No results for input(s): "AMMONIA" in the last 168 hours. Coagulation Profile: Recent Labs  Lab 06/06/23 1218  INR 1.3*   Cardiac Enzymes: No results for input(s): "CKTOTAL", "CKMB", "CKMBINDEX", "TROPONINI" in the last 168 hours. BNP (last 3 results) No results  for input(s): "PROBNP" in the last 8760 hours. HbA1C: No results for input(s): "HGBA1C" in the last 72 hours. CBG: Recent Labs  Lab 06/06/23 1719 06/06/23 2117 06/07/23 0737 06/07/23 1146  GLUCAP 196* 196* 160* 225*   Lipid Profile: No results for input(s): "CHOL", "HDL", "LDLCALC", "TRIG", "CHOLHDL", "LDLDIRECT" in the last 72 hours. Thyroid Function Tests: No results for input(s): "TSH", "T4TOTAL", "FREET4", "T3FREE", "THYROIDAB" in the last 72 hours. Anemia Panel: No results for input(s): "VITAMINB12", "FOLATE", "FERRITIN", "TIBC", "IRON", "RETICCTPCT" in the last 72 hours. Sepsis Labs: Recent Labs  Lab 06/06/23 1218 06/07/23 0427  PROCALCITON  --  0.86  LATICACIDVEN 1.6  --     Recent Results (from the past 240 hours)  Culture, blood (Routine x 2)     Status: None (Preliminary result)   Collection Time: 06/06/23 11:58 AM   Specimen: BLOOD RIGHT HAND  Result Value Ref Range Status   Specimen Description   Final    BLOOD RIGHT HAND Performed at [facility], [address] [place], [place] [address]    Special Requests   Final    BOTTLES DRAWN AEROBIC AND ANAEROBIC Blood Culture results may not be optimal due to an inadequate volume of blood received in culture bottles Performed at [facility], [address] [place], [place] [address]    Culture   Final    NO GROWTH < 24 HOURS Performed at [facility], [address], [place], [place] [address]    Report Status PENDING  Incomplete  Resp panel by RT-PCR (RSV, Flu A&B, Covid) Peripheral     Status: None   Collection Time: 06/06/23 12:18 PM   Specimen: Peripheral; Nasal Swab  Result Value Ref Range Status   SARS Coronavirus 2 by RT PCR NEGATIVE NEGATIVE Final    Comment: (NOTE) SARS-CoV-2 target nucleic acids are NOT DETECTED.  The SARS-CoV-2 RNA is generally detectable in upper respiratory specimens during the acute phase of infection. The lowest concentration of SARS-CoV-2 viral  copies this assay can detect is 138 copies/mL. A negative result does not preclude SARS-Cov-2 infection and should not be used as the sole basis for treatment or other patient management decisions. A negative result may occur with  improper specimen collection/handling, submission of specimen other than nasopharyngeal swab, presence of viral mutation(s) within the areas targeted by this assay, and inadequate number of viral copies(<138 copies/mL). A negative result must be combined with clinical observations, patient history, and epidemiological information. The  expected result is Negative.  Fact Sheet for Patients:  BloggerCourse.com  Fact Sheet for Healthcare Providers:  SeriousBroker.it  This test is no t yet approved or cleared by the [place] FDA and  has been authorized for detection and/or diagnosis of SARS-CoV-2 by FDA under an Emergency Use Authorization (EUA). This EUA will remain  in effect (meaning this test can be used) for the duration of the COVID-19 declaration under Section 564(b)(1) of the Act, 21 U.S.C.section 360bbb-3(b)(1), unless the authorization is terminated  or revoked sooner.       Influenza A by PCR NEGATIVE NEGATIVE Final   Influenza B by PCR NEGATIVE NEGATIVE Final    Comment: (NOTE) The Xpert Xpress SARS-CoV-2/FLU/RSV plus assay is intended as an aid in the diagnosis of influenza from Nasopharyngeal swab specimens and should not be used as a sole basis for treatment. Nasal washings and aspirates are unacceptable for Xpert Xpress SARS-CoV-2/FLU/RSV testing.  Fact Sheet for Patients: BloggerCourse.com  Fact Sheet for Healthcare Providers: SeriousBroker.it  This test is not yet approved or cleared by the [place] FDA and has been authorized for detection and/or diagnosis of SARS-CoV-2 by FDA under an Emergency Use Authorization (EUA). This  EUA will remain in effect (meaning this test can be used) for the duration of the COVID-19 declaration under Section 564(b)(1) of the Act, 21 U.S.C. section 360bbb-3(b)(1), unless the authorization is terminated or revoked.     Resp Syncytial Virus by PCR NEGATIVE NEGATIVE Final    Comment: (NOTE) Fact Sheet for Patients: BloggerCourse.com  Fact Sheet for Healthcare Providers: SeriousBroker.it  This test is not yet approved or cleared by the [place] FDA and has been authorized for detection and/or diagnosis of SARS-CoV-2 by FDA under an Emergency Use Authorization (EUA). This EUA will remain in effect (meaning this test can be used) for the duration of the COVID-19 declaration under Section 564(b)(1) of the Act, 21 U.S.C. section 360bbb-3(b)(1), unless the authorization is terminated or revoked.  Performed at [facility], [address] [place], [place] [address]   Culture, blood (Routine x 2)     Status: None (Preliminary result)   Collection Time: 06/06/23 12:19 PM   Specimen: BLOOD  Result Value Ref Range Status   Specimen Description   Final    BLOOD LEFT ANTECUBITAL Performed at [facility], [address] [place], [place] [address]    Special Requests   Final    BOTTLES DRAWN AEROBIC AND ANAEROBIC Blood Culture adequate volume Performed at [facility], [address] [place], [place] [address]    Culture   Final    NO GROWTH < 24 HOURS Performed at [facility], [address], [place], [place] [address]    Report Status PENDING  Incomplete  Respiratory (~20 pathogens) panel by PCR     Status: None   Collection Time: 06/06/23  6:41 PM   Specimen: Nasopharyngeal Swab; Respiratory  Result Value Ref Range Status   Adenovirus NOT DETECTED NOT DETECTED Final   Coronavirus 229E NOT DETECTED NOT DETECTED Final    Comment: (NOTE) The Coronavirus on the Respiratory Panel,  DOES NOT test for the novel  Coronavirus (2019 nCoV)    Coronavirus HKU1 NOT DETECTED NOT DETECTED Final   Coronavirus NL63 NOT DETECTED NOT DETECTED Final   Coronavirus OC43 NOT DETECTED NOT DETECTED Final   Metapneumovirus NOT DETECTED NOT DETECTED Final   Rhinovirus / Enterovirus NOT DETECTED NOT DETECTED  Final   Influenza A NOT DETECTED NOT DETECTED Final   Influenza B NOT DETECTED NOT DETECTED Final   Parainfluenza Virus 1 NOT DETECTED NOT DETECTED Final   Parainfluenza Virus 2 NOT DETECTED NOT DETECTED Final   Parainfluenza Virus 3 NOT DETECTED NOT DETECTED Final   Parainfluenza Virus 4 NOT DETECTED NOT DETECTED Final   Respiratory Syncytial Virus NOT DETECTED NOT DETECTED Final   Bordetella pertussis NOT DETECTED NOT DETECTED Final   Bordetella Parapertussis NOT DETECTED NOT DETECTED Final   Chlamydophila pneumoniae NOT DETECTED NOT DETECTED Final   Mycoplasma pneumoniae NOT DETECTED NOT DETECTED Final    Comment: Performed at [facility], [address] [place], [place] [address]  MRSA Next Gen by PCR, Nasal     Status: None   Collection Time: 06/06/23  6:46 PM   Specimen: Nasal Mucosa; Nasal Swab  Result Value Ref Range Status   MRSA by PCR Next Gen NOT DETECTED NOT DETECTED Final    Comment: (NOTE) The GeneXpert MRSA Assay (FDA approved for NASAL specimens only), is one component of a comprehensive MRSA colonization surveillance program. It is not intended to diagnose MRSA infection nor to guide or monitor treatment for MRSA infections. Test performance is not FDA approved in patients less than 67 years old. Performed at [facility], [address], [place], [place] [address]   Expectorated Sputum Assessment w Gram Stain, Rflx to Resp Cult     Status: None (Preliminary result)   Collection Time: 06/06/23  7:43 PM   Specimen: Sputum  Result Value Ref Range Status   Specimen Description SPUTUM  Final   Special Requests NONE  Final   Sputum  evaluation   Final    THIS SPECIMEN IS ACCEPTABLE FOR SPUTUM CULTURE Performed at [facility], [address] [place], [place] [address]    Report Status PENDING  Incomplete  Culture, Respiratory w Gram Stain     Status: None (Preliminary result)   Collection Time: 06/06/23  7:43 PM   Specimen: SPU  Result Value Ref Range Status   Specimen Description   Final    SPUTUM Performed at [facility], [address], [place], [place] [address]    Special Requests   Final    NONE Reflexed from [id number] Performed at [facility], [address] [place], [place] [address]    Gram Stain   Final    NO WBC SEEN FEW GRAM POSITIVE COCCI IN PAIRS FEW GRAM NEGATIVE RODS Performed at [facility], [address], [place], [place] [address]    Culture PENDING  Incomplete   Report Status PENDING  Incomplete         Radiology Studies: DG Chest 2 View Result Date: 06/06/2023 CLINICAL DATA:  Shortness of breath, chest pain and cough. EXAM: CHEST - 2 VIEW COMPARISON:  03/02/2023 FINDINGS: Stable cardiomediastinal contours. Small right pleural effusion. Asymmetric opacification within the right mid lung in the perihilar distribution is identified. Visualized osseous structures are intact. Signs of previous ACDF within the lower cervical spine. IMPRESSION: 1. Asymmetric opacification within the right mid lung in the perihilar distribution. Differential considerations include pneumonia versus asymmetric pulmonary edema. 2. Small right pleural effusion. Electronically Signed   By: Signa Kell M.D.   On: 06/06/2023 15:45        Scheduled Meds:  amiodarone  200 mg Oral BID   atorvastatin  40 mg Oral QPM   budesonide  2 mL Nebulization BID   carvedilol  12.5 mg Oral BID WC   clopidogrel  75 mg Oral Daily   dextromethorphan-guaiFENesin  1 tablet Oral BID   donepezil  10 mg Oral QHS   furosemide  80 mg Intravenous Daily   insulin  aspart  0-15 Units Subcutaneous TID WC   insulin aspart  0-5 Units Subcutaneous QHS   insulin glargine-yfgn  40 Units Subcutaneous BID   ipratropium-albuterol  3 mL Nebulization QID   isosorbide mononitrate  30 mg Oral Daily   latanoprost  1 drop Both Eyes QHS   memantine  5 mg Oral BID   mirabegron ER  50 mg Oral Daily   mometasone-formoterol  2 puff Inhalation BID   montelukast  10 mg Oral QHS   oxybutynin  10 mg Oral Daily   pantoprazole  40 mg Oral Daily   predniSONE  40 mg Oral Q breakfast   rivaroxaban  20 mg Oral Q supper   sertraline  50 mg Oral BH-q7a   spironolactone  25 mg Oral Daily   Continuous Infusions:  ceFEPime (MAXIPIME) IV 2 g (06/07/23 0935)   vancomycin 1,250 mg (06/07/23 1203)     LOS: 1 day    Time spent: 50 mins    Willeen Niece, MD [facility]   If 7PM-7AM, please contact night-coverage

## 2023-06-07 NOTE — Plan of Care (Signed)
  Problem: Education: Goal: Knowledge of General Education information will improve Description: Including pain rating scale, medication(s)/side effects and non-pharmacologic comfort measures Outcome: Progressing   Problem: Health Behavior/Discharge Planning: Goal: Ability to manage health-related needs will improve Outcome: Progressing   Problem: Activity: Goal: Risk for activity intolerance will decrease Outcome: Progressing   Problem: Nutrition: Goal: Adequate nutrition will be maintained Outcome: Progressing   Problem: Coping: Goal: Level of anxiety will decrease Outcome: Progressing   Problem: Elimination: Goal: Will not experience complications related to bowel motility Outcome: Progressing Goal: Will not experience complications related to urinary retention Outcome: Progressing   

## 2023-06-07 NOTE — Progress Notes (Signed)
Mobility Specialist - Progress Note  ([place] 2L) Pre-mobility: 78 bpm HR, 96% SpO2 During mobility: 88 bpm HR, 95% SpO2 Post-mobility: 83 bpm HR, 96% SPO2   06/07/23 1517  Mobility  Activity Ambulated with assistance in room  Level of Assistance Contact guard assist, steadying assist  Assistive Device Front wheel walker  Distance Ambulated (ft) 30 ft  Range of Motion/Exercises Active  Activity Response Tolerated fair  Mobility Referral Yes  Mobility visit 1 Mobility  Mobility Specialist Start Time (ACUTE ONLY) 1500  Mobility Specialist Stop Time (ACUTE ONLY) 1517  Mobility Specialist Time Calculation (min) (ACUTE ONLY) 17 min   Pt was found in bed and agreeable to ambulate. When sitting EOB stated feeling dizzy and BP taken, 139/64 mmHg (85 MAP) and pulse 78 bpm. Once standing stated feeling dizzy again and BP taken, 136/59 mmHg (80 MAP) and pulse 84 bpm. Was able to ambulate in room and gre fatigued. Encouraged pursed lip breathing throughout session. At EOS returned to bed with all needs met. Call bell in reach and RN notified of session.  Billey Chang Mobility Specialist

## 2023-06-07 NOTE — Plan of Care (Signed)
  Problem: Education: Goal: Knowledge of General Education information will improve Description: Including pain rating scale, medication(s)/side effects and non-pharmacologic comfort measures Outcome: Progressing   Problem: Health Behavior/Discharge Planning: Goal: Ability to manage health-related needs will improve Outcome: Progressing   Problem: Activity: Goal: Risk for activity intolerance will decrease Outcome: Progressing   Problem: Nutrition: Goal: Adequate nutrition will be maintained Outcome: Progressing   Problem: Coping: Goal: Level of anxiety will decrease Outcome: Progressing   Problem: Elimination: Goal: Will not experience complications related to urinary retention Outcome: Progressing

## 2023-06-08 DIAGNOSIS — A419 Sepsis, unspecified organism: Secondary | ICD-10-CM | POA: Diagnosis not present

## 2023-06-08 DIAGNOSIS — I5032 Chronic diastolic (congestive) heart failure: Secondary | ICD-10-CM | POA: Diagnosis not present

## 2023-06-08 DIAGNOSIS — J189 Pneumonia, unspecified organism: Secondary | ICD-10-CM | POA: Diagnosis not present

## 2023-06-08 DIAGNOSIS — J441 Chronic obstructive pulmonary disease with (acute) exacerbation: Secondary | ICD-10-CM | POA: Diagnosis not present

## 2023-06-08 DIAGNOSIS — I5033 Acute on chronic diastolic (congestive) heart failure: Secondary | ICD-10-CM | POA: Diagnosis not present

## 2023-06-08 LAB — CBC
HCT: 30.5 % — ABNORMAL LOW (ref 39.0–52.0)
Hemoglobin: 9 g/dL — ABNORMAL LOW (ref 13.0–17.0)
MCH: 23.6 pg — ABNORMAL LOW (ref 26.0–34.0)
MCHC: 29.5 g/dL — ABNORMAL LOW (ref 30.0–36.0)
MCV: 80.1 fL (ref 80.0–100.0)
Platelets: 290 10*3/uL (ref 150–400)
RBC: 3.81 MIL/uL — ABNORMAL LOW (ref 4.22–5.81)
RDW: 16.4 % — ABNORMAL HIGH (ref 11.5–15.5)
WBC: 13.9 10*3/uL — ABNORMAL HIGH (ref 4.0–10.5)
nRBC: 0 % (ref 0.0–0.2)

## 2023-06-08 LAB — MAGNESIUM: Magnesium: 2.6 mg/dL — ABNORMAL HIGH (ref 1.7–2.4)

## 2023-06-08 LAB — GLUCOSE, CAPILLARY
Glucose-Capillary: 201 mg/dL — ABNORMAL HIGH (ref 70–99)
Glucose-Capillary: 259 mg/dL — ABNORMAL HIGH (ref 70–99)
Glucose-Capillary: 298 mg/dL — ABNORMAL HIGH (ref 70–99)
Glucose-Capillary: 269 mg/dL — ABNORMAL HIGH (ref 70–99)

## 2023-06-08 LAB — BASIC METABOLIC PANEL
Anion gap: 12 (ref 5–15)
BUN: 35 mg/dL — ABNORMAL HIGH (ref 8–23)
CO2: 19 mmol/L — ABNORMAL LOW (ref 22–32)
Calcium: 8.8 mg/dL — ABNORMAL LOW (ref 8.9–10.3)
Chloride: 107 mmol/L (ref 98–111)
Creatinine, Ser: 1.38 mg/dL — ABNORMAL HIGH (ref 0.61–1.24)
GFR, Estimated: 54 mL/min — ABNORMAL LOW (ref >60)
Glucose, Bld: 221 mg/dL — ABNORMAL HIGH (ref 70–99)
Potassium: 4 mmol/L (ref 3.5–5.1)
Sodium: 138 mmol/L (ref 135–145)

## 2023-06-08 LAB — PHOSPHORUS: Phosphorus: 2.8 mg/dL (ref 2.5–4.6)

## 2023-06-08 MED ORDER — INSULIN ASPART 100 UNIT/ML IJ SOLN
6.0000 [IU] | Freq: Three times a day (TID) | INTRAMUSCULAR | Status: DC
  Administered 2023-06-08 – 2023-06-10 (×5): 6 [IU] via SUBCUTANEOUS

## 2023-06-08 MED ORDER — IPRATROPIUM-ALBUTEROL 0.5-2.5 (3) MG/3ML IN SOLN
3.0000 mL | Freq: Three times a day (TID) | RESPIRATORY_TRACT | Status: DC
Start: 2023-06-08 — End: 2023-06-10
  Administered 2023-06-08 – 2023-06-09 (×5): 3 mL via RESPIRATORY_TRACT
  Filled 2023-06-08 (×6): qty 3

## 2023-06-08 MED ORDER — SODIUM CHLORIDE 0.9 % IV SOLN
2.0000 g | Freq: Three times a day (TID) | INTRAVENOUS | Status: DC
  Administered 2023-06-08 – 2023-06-10 (×6): 2 g via INTRAVENOUS
  Filled 2023-06-08 (×6): qty 12.5

## 2023-06-08 NOTE — Progress Notes (Signed)
   06/07/23 2110  BiPAP/CPAP/SIPAP  BiPAP/CPAP/SIPAP Pt Type Adult  BiPAP/CPAP/SIPAP Resmed  Mask Type Full face mask  Mask Size Medium  Respiratory Rate 18 breaths/min  Flow Rate 3 lpm  Patient Home Equipment No  Auto Titrate Yes (<7/>25)  CPAP/SIPAP surface wiped down Yes   Pt. assisted in placement of CPAP(Resmed), made aware to notify if needed.

## 2023-06-08 NOTE — Progress Notes (Signed)
PROGRESS NOTE    Mike Hunter  WUJ:[id number] DOB: March 26, 1951 DOA: 06/06/2023 PCP: Lenox Ponds, MD   Brief Narrative:  72 yrs old male with PMH significant for moderate persistent asthma, DM II, class II obesity, HLD, HTN, GERD, COPD on 1 L [place] at night, OSA on CPAP, AAA, left carotid stenosis s/p stent placement on 12/19, CKD 3A, paroxysmal A-fib on Xarelto, BPH, urge incontinence, bipolar disorder, anxiety and depression, episodic mood disorder, CAD, non-rheumatic aortic valve stenosis, osteoarthritis and chronic diastolic presented with worsening shortness of breath.  On presentation, chest x-ray showed opacity within the right midlung concerning for pneumonia.  He was started on IV antibiotics.  Assessment & Plan:   Sepsis: Present on admission Healthcare associated pneumonia -Currently on 2 L oxygen by nasal cannula.  Hemodynamically stable.  Blood cultures negative so far.  Sputum culture growing gram-positive cocci in pairs and few gram-negative rods.  Currently on broad-spectrum antibiotics.  Acute on chronic diastolic heart failure Moderate aortic stenosis -Stress echo on 05/27/2023 had showed EF of 55 to 60% with moderate  aortic stenosis -Strict input and output.  Daily weights.  Continue Lasix IV.  Continue Coreg, spironolactone, Imdur.  Negative fluid balance of 2670 cc since admission. -I have consulted cardiology as per patient's request.  Follow recommendations.  COPD exacerbation Chronic respiratory failure with hypoxia -Continue current inhaled and nebulizer regimen.  Continue prednisone  Diabetes mellitus type 2 with hyperglycemia -Recent A1c 7.9.  Continue long-acting insulin along with CBGs with SSI.  Carb modified diet  Paroxysmal A-fib -Currently rate controlled.  Continue Coreg, Xarelto and amiodarone  Left carotid stenosis -Had stenting of his left carotic artery stenosis on 12/19 at [facility]. Continue Plavix and Xarelto -Follow-up with vascular surgery  in the outpatient  CAD with angina pectoris AAA -Patient had a positive dobutamine stress echo on 12/11. He reports intermittent chest pain. No chest pain at the moment.  -Troponins are flat. EKG without any ischemic changes. Lipid panel 3 months ago showed LDL of 40. -Continue Plavix, Imdur, atorvastatin and Xarelto -Follow-up with cardiology in the outpatient  CKD stage IIIa -Baseline creatinine of 1.4-1.6.  Currently stable  Hypokalemia -Improved  Leukocytosis -Improving.  Monitor  Anemia of chronic disease -From chronic illnesses.  Hemoglobin stable.  Monitor intermittently  BPH Urge incontinence -Continue Myrbetriq and oxybutynin.  Outpatient follow-up with urology  OSA -Continue CPAP at bedtime  Cognitive deficits Mild dementia -Continue memantine and donepezil.  Outpatient follow-up with PCP/neurology  Anxiety and depression -Continue sertraline  GERD Continue Protonix  Class II obesity -Outpatient follow-up  DVT prophylaxis: Xarelto Code Status: Full Family Communication: None at bedside Disposition Plan: Status is: Inpatient Remains inpatient appropriate because: Of severity of illness    Consultants: Cardiology  Procedures: None  Antimicrobials:  Anti-infectives (From admission, onward)    Start     Dose/Rate Route Frequency Ordered Stop   06/08/23 1800  ceFEPIme (MAXIPIME) 2 g in sodium chloride 0.9 % 100 mL IVPB        2 g 200 mL/hr over 30 Minutes Intravenous Every 8 hours 06/08/23 1004     06/07/23 1000  vancomycin (VANCOREADY) IVPB 1250 mg/250 mL        1,250 mg 166.7 mL/hr over 90 Minutes Intravenous Every 24 hours 06/06/23 2124     06/06/23 2200  ceFEPIme (MAXIPIME) 2 g in sodium chloride 0.9 % 100 mL IVPB  Status:  Discontinued        2 g 200 mL/hr over  30 Minutes Intravenous Every 12 hours 06/06/23 2124 06/08/23 1004   06/06/23 1330  vancomycin (VANCOCIN) IVPB 1000 mg/200 mL premix       Placed in "Followed by" Linked Group    1,000 mg 200 mL/hr over 60 Minutes Intravenous  Once 06/06/23 1212 06/06/23 1628   06/06/23 1215  ceFEPIme (MAXIPIME) 1 g in sodium chloride 0.9 % 100 mL IVPB        1 g 200 mL/hr over 30 Minutes Intravenous  Once 06/06/23 1209 06/06/23 1514   06/06/23 1215  vancomycin (VANCOCIN) IVPB 1000 mg/200 mL premix       Placed in "Followed by" Linked Group   1,000 mg 200 mL/hr over 60 Minutes Intravenous  Once 06/06/23 1212 06/06/23 1515        Subjective: Patient seen and examined at bedside.  Feels slightly better.  Still short of breath with exertion.  Wants to talk to a cardiologist.  No fever, vomiting, agitation reported.  Objective: Vitals:   06/07/23 2051 06/08/23 0447 06/08/23 0500 06/08/23 0920  BP:  116/63    Pulse:  64    Resp:  20    Temp:  97.7 F (36.5 C)    TempSrc:  Oral    SpO2: 95% 95%  97%  Weight:   122.1 kg   Height:        Intake/Output Summary (Last 24 hours) at 06/08/2023 1140 Last data filed at 06/08/2023 0914 Gross per 24 hour  Intake 690 ml  Output 2700 ml  Net -2010 ml   Filed Weights   06/06/23 1649 06/07/23 0500 06/08/23 0500  Weight: 122 kg 122.4 kg 122.1 kg    Examination:  General exam: Appears calm and comfortable.  On 2 L oxygen by nasal cannula.  Chronically ill and deconditioned. Respiratory system: Bilateral decreased breath sounds at bases with basilar crackles Cardiovascular system: S1 & S2 heard, Rate controlled Gastrointestinal system: Abdomen is nondistended, soft and nontender. Normal bowel sounds heard. Extremities: No cyanosis, clubbing; bilateral lower extremity edema present Central nervous system: Alert and oriented.  Slow to respond.  Poor historian.  No focal neurological deficits. Moving extremities Skin: No rashes, lesions or ulcers Psychiatry: Flat affect; not agitated.   Data Reviewed: I have personally reviewed following labs and imaging studies  CBC: Recent Labs  Lab 06/06/23 1218 06/07/23 0427  06/08/23 0406  WBC 24.4* 17.8* 13.9*  NEUTROABS 22.0*  --   --   HGB 10.6* 9.5* 9.0*  HCT 35.4* 32.5* 30.5*  MCV 77.1* 80.4 80.1  PLT 337 288 290   Basic Metabolic Panel: Recent Labs  Lab 06/06/23 1218 06/07/23 0427 06/08/23 0406  NA 137 138 138  K 3.7 3.4* 4.0  CL 107 107 107  CO2 20* 21* 19*  GLUCOSE 186* 198* 221*  BUN 22 25* 35*  CREATININE 1.62* 1.49* 1.38*  CALCIUM 8.6* 8.3* 8.8*  MG  --  2.4 2.6*  PHOS  --  2.6 2.8   GFR: Estimated Creatinine Clearance: 62.4 mL/min (A) (by C-G formula based on SCr of 1.38 mg/dL (H)). Liver Function Tests: Recent Labs  Lab 06/06/23 1218  AST 29  ALT 41  ALKPHOS 84  BILITOT 1.2*  PROT 7.5  ALBUMIN 3.2*   No results for input(s): "LIPASE", "AMYLASE" in the last 168 hours. No results for input(s): "AMMONIA" in the last 168 hours. Coagulation Profile: Recent Labs  Lab 06/06/23 1218  INR 1.3*   Cardiac Enzymes: No results for input(s): "CKTOTAL", "CKMB", "CKMBINDEX", "  TROPONINI" in the last 168 hours. BNP (last 3 results) No results for input(s): "PROBNP" in the last 8760 hours. HbA1C: No results for input(s): "HGBA1C" in the last 72 hours. CBG: Recent Labs  Lab 06/07/23 1146 06/07/23 1611 06/07/23 2105 06/08/23 0752 06/08/23 1113  GLUCAP 225* 261* 270* 201* 259*   Lipid Profile: No results for input(s): "CHOL", "HDL", "LDLCALC", "TRIG", "CHOLHDL", "LDLDIRECT" in the last 72 hours. Thyroid Function Tests: No results for input(s): "TSH", "T4TOTAL", "FREET4", "T3FREE", "THYROIDAB" in the last 72 hours. Anemia Panel: No results for input(s): "VITAMINB12", "FOLATE", "FERRITIN", "TIBC", "IRON", "RETICCTPCT" in the last 72 hours. Sepsis Labs: Recent Labs  Lab 06/06/23 1218 06/07/23 0427  PROCALCITON  --  0.86  LATICACIDVEN 1.6  --     Recent Results (from the past 240 hours)  Culture, blood (Routine x 2)     Status: None (Preliminary result)   Collection Time: 06/06/23 11:58 AM   Specimen: BLOOD RIGHT HAND   Result Value Ref Range Status   Specimen Description   Final    BLOOD RIGHT HAND Performed at [facility], [address] [place], [place] [address]    Special Requests   Final    BOTTLES DRAWN AEROBIC AND ANAEROBIC Blood Culture results may not be optimal due to an inadequate volume of blood received in culture bottles Performed at [facility], [address] [place], [place] [address]    Culture   Final    NO GROWTH 2 DAYS Performed at [facility], [address] [place], [place] [address]    Report Status PENDING  Incomplete  Resp panel by RT-PCR (RSV, Flu A&B, Covid) Peripheral     Status: None   Collection Time: 06/06/23 12:18 PM   Specimen: Peripheral; Nasal Swab  Result Value Ref Range Status   SARS Coronavirus 2 by RT PCR NEGATIVE NEGATIVE Final    Comment: (NOTE) SARS-CoV-2 target nucleic acids are NOT DETECTED.  The SARS-CoV-2 RNA is generally detectable in upper respiratory specimens during the acute phase of infection. The lowest concentration of SARS-CoV-2 viral copies this assay can detect is 138 copies/mL. A negative result does not preclude SARS-Cov-2 infection and should not be used as the sole basis for treatment or other patient management decisions. A negative result may occur with  improper specimen collection/handling, submission of specimen other than nasopharyngeal swab, presence of viral mutation(s) within the areas targeted by this assay, and inadequate number of viral copies(<138 copies/mL). A negative result must be combined with clinical observations, patient history, and epidemiological information. The expected result is Negative.  Fact Sheet for Patients:  BloggerCourse.com  Fact Sheet for Healthcare Providers:  SeriousBroker.it  This test is no t yet approved or cleared by the [place] FDA and  has been authorized for detection and/or diagnosis of SARS-CoV-2  by FDA under an Emergency Use Authorization (EUA). This EUA will remain  in effect (meaning this test can be used) for the duration of the COVID-19 declaration under Section 564(b)(1) of the Act, 21 U.S.C.section 360bbb-3(b)(1), unless the authorization is terminated  or revoked sooner.       Influenza A by PCR NEGATIVE NEGATIVE Final   Influenza B by PCR NEGATIVE NEGATIVE Final    Comment: (NOTE) The Xpert Xpress SARS-CoV-2/FLU/RSV plus assay is intended as an aid in the diagnosis of influenza from Nasopharyngeal swab specimens and should not be used as a sole basis for treatment. Nasal washings and aspirates are unacceptable  for Xpert Xpress SARS-CoV-2/FLU/RSV testing.  Fact Sheet for Patients: BloggerCourse.com  Fact Sheet for Healthcare Providers: SeriousBroker.it  This test is not yet approved or cleared by the [place] FDA and has been authorized for detection and/or diagnosis of SARS-CoV-2 by FDA under an Emergency Use Authorization (EUA). This EUA will remain in effect (meaning this test can be used) for the duration of the COVID-19 declaration under Section 564(b)(1) of the Act, 21 U.S.C. section 360bbb-3(b)(1), unless the authorization is terminated or revoked.     Resp Syncytial Virus by PCR NEGATIVE NEGATIVE Final    Comment: (NOTE) Fact Sheet for Patients: BloggerCourse.com  Fact Sheet for Healthcare Providers: SeriousBroker.it  This test is not yet approved or cleared by the [place] FDA and has been authorized for detection and/or diagnosis of SARS-CoV-2 by FDA under an Emergency Use Authorization (EUA). This EUA will remain in effect (meaning this test can be used) for the duration of the COVID-19 declaration under Section 564(b)(1) of the Act, 21 U.S.C. section 360bbb-3(b)(1), unless the authorization is terminated or revoked.  Performed at [facility], [address] [place], [place] [address]   Culture, blood (Routine x 2)     Status: None (Preliminary result)   Collection Time: 06/06/23 12:19 PM   Specimen: BLOOD  Result Value Ref Range Status   Specimen Description   Final    BLOOD LEFT ANTECUBITAL Performed at [facility], [address] [place], [place] [address]    Special Requests   Final    BOTTLES DRAWN AEROBIC AND ANAEROBIC Blood Culture adequate volume Performed at [facility], [address] [place], [place] [address]    Culture   Final    NO GROWTH 2 DAYS Performed at [facility], [address], [place], [place] [address]    Report Status PENDING  Incomplete  Respiratory (~20 pathogens) panel by PCR     Status: None   Collection Time: 06/06/23  6:41 PM   Specimen: Nasopharyngeal Swab; Respiratory  Result Value Ref Range Status   Adenovirus NOT DETECTED NOT DETECTED Final   Coronavirus 229E NOT DETECTED NOT DETECTED Final    Comment: (NOTE) The Coronavirus on the Respiratory Panel, DOES NOT test for the novel  Coronavirus (2019 nCoV)    Coronavirus HKU1 NOT DETECTED NOT DETECTED Final   Coronavirus NL63 NOT DETECTED NOT DETECTED Final   Coronavirus OC43 NOT DETECTED NOT DETECTED Final   Metapneumovirus NOT DETECTED NOT DETECTED Final   Rhinovirus / Enterovirus NOT DETECTED NOT DETECTED Final   Influenza A NOT DETECTED NOT DETECTED Final   Influenza B NOT DETECTED NOT DETECTED Final   Parainfluenza Virus 1 NOT DETECTED NOT DETECTED Final   Parainfluenza Virus 2 NOT DETECTED NOT DETECTED Final   Parainfluenza Virus 3 NOT DETECTED NOT DETECTED Final   Parainfluenza Virus 4 NOT DETECTED NOT DETECTED Final   Respiratory Syncytial Virus NOT DETECTED NOT DETECTED Final   Bordetella pertussis NOT DETECTED NOT DETECTED Final   Bordetella Parapertussis NOT DETECTED NOT DETECTED Final   Chlamydophila pneumoniae NOT DETECTED NOT DETECTED Final   Mycoplasma  pneumoniae NOT DETECTED NOT DETECTED Final    Comment: Performed at [facility], [address] [place], [place] [address]  MRSA Next Gen by PCR, Nasal     Status: None   Collection Time: 06/06/23  6:46 PM   Specimen: Nasal Mucosa; Nasal Swab  Result Value Ref Range Status  MRSA by PCR Next Gen NOT DETECTED NOT DETECTED Final    Comment: (NOTE) The GeneXpert MRSA Assay (FDA approved for NASAL specimens only), is one component of a comprehensive MRSA colonization surveillance program. It is not intended to diagnose MRSA infection nor to guide or monitor treatment for MRSA infections. Test performance is not FDA approved in patients less than 14 years old. Performed at [facility], [address], [place], [place] [address]   Expectorated Sputum Assessment w Gram Stain, Rflx to Resp Cult     Status: None   Collection Time: 06/06/23  7:43 PM   Specimen: Sputum  Result Value Ref Range Status   Specimen Description SPUTUM  Final   Special Requests NONE  Final   Sputum evaluation   Final    THIS SPECIMEN IS ACCEPTABLE FOR SPUTUM CULTURE Performed at [facility], [address], [place], [place] [address]    Report Status 06/07/2023 FINAL  Final  Culture, Respiratory w Gram Stain     Status: None (Preliminary result)   Collection Time: 06/06/23  7:43 PM   Specimen: SPU  Result Value Ref Range Status   Specimen Description   Final    SPUTUM Performed at [facility], [address] [place], [place] [address]    Special Requests   Final    NONE Reflexed from [id number] Performed at [facility], [address], [place], [place] [address]    Gram Stain   Final    NO WBC SEEN FEW GRAM POSITIVE COCCI IN PAIRS FEW GRAM NEGATIVE RODS    Culture   Final    CULTURE REINCUBATED FOR BETTER GROWTH Performed at [facility], [address], [place], [place] [address]    Report Status PENDING   Incomplete         Radiology Studies: DG Chest 2 View Result Date: 06/06/2023 CLINICAL DATA:  Shortness of breath, chest pain and cough. EXAM: CHEST - 2 VIEW COMPARISON:  03/02/2023 FINDINGS: Stable cardiomediastinal contours. Small right pleural effusion. Asymmetric opacification within the right mid lung in the perihilar distribution is identified. Visualized osseous structures are intact. Signs of previous ACDF within the lower cervical spine. IMPRESSION: 1. Asymmetric opacification within the right mid lung in the perihilar distribution. Differential considerations include pneumonia versus asymmetric pulmonary edema. 2. Small right pleural effusion. Electronically Signed   By: Signa Kell M.D.   On: 06/06/2023 15:45        Scheduled Meds:  amiodarone  200 mg Oral BID   atorvastatin  40 mg Oral QPM   budesonide  2 mL Nebulization BID   carvedilol  12.5 mg Oral BID WC   clopidogrel  75 mg Oral Daily   dextromethorphan-guaiFENesin  1 tablet Oral BID   donepezil  10 mg Oral QHS   furosemide  80 mg Intravenous Daily   insulin aspart  0-15 Units Subcutaneous TID WC   insulin aspart  0-5 Units Subcutaneous QHS   insulin glargine-yfgn  40 Units Subcutaneous BID   ipratropium-albuterol  3 mL Nebulization TID   isosorbide mononitrate  30 mg Oral Daily   latanoprost  1 drop Both Eyes QHS   memantine  5 mg Oral BID   mirabegron ER  50 mg Oral Daily   mometasone-formoterol  2 puff Inhalation BID   montelukast  10 mg Oral QHS   oxybutynin  10 mg Oral Daily   pantoprazole  40 mg Oral Daily  predniSONE  40 mg Oral Q breakfast   rivaroxaban  20 mg Oral Q supper   sertraline  50 mg Oral BH-q7a   spironolactone  25 mg Oral Daily   Continuous Infusions:  ceFEPime (MAXIPIME) IV     vancomycin 1,250 mg (06/08/23 1023)          Glade Lloyd, MD [facility] 06/08/2023, 11:40 AM

## 2023-06-08 NOTE — Plan of Care (Signed)
  Problem: Activity: Goal: Risk for activity intolerance will decrease Outcome: Progressing   Problem: Nutrition: Goal: Adequate nutrition will be maintained Outcome: Progressing   Problem: Coping: Goal: Level of anxiety will decrease Outcome: Progressing   Problem: Elimination: Goal: Will not experience complications related to urinary retention Outcome: Progressing   Problem: Pain Management: Goal: General experience of comfort will improve Outcome: Progressing   Problem: Safety: Goal: Ability to remain free from injury will improve Outcome: Progressing

## 2023-06-08 NOTE — Inpatient Diabetes Management (Signed)
Inpatient Diabetes Program Recommendations  AACE/ADA: New Consensus Statement on Inpatient Glycemic Control (2015)  Target Ranges:  Prepandial:   less than 140 mg/dL      Peak postprandial:   less than 180 mg/dL (1-2 hours)      Critically ill patients:  140 - 180 mg/dL    Latest Reference Range & Units 06/07/23 07:37 06/07/23 11:46 06/07/23 16:11 06/07/23 21:05  Glucose-Capillary 70 - 99 mg/dL 098 (H)  3 units Novolog  40 units Semglee  225 (H)  5 units Novolog  261 (H)  8 units Novolog  270 (H)  3 units Novolog  40 units Semglee   (H): Data is abnormally high  Latest Reference Range & Units 06/08/23 07:52  Glucose-Capillary 70 - 99 mg/dL 119 (H)  5 units Novolog  40 units Semglee  (H): Data is abnormally high   Admit with: Sepsis due to pneumonia   History: DM  Home DM Meds: Novolog 26 units BF/ 30 units Lunch/ 36-44 units [place]       Tresiba 40 units AM/ 50 units PM  Current Orders: Semglee 40 units BID      Novolog Moderate Correction Scale/ SSI (0-15 units) TID AC + HS   ENDO: March Rummage, PA with [facility] Last Seen 01/29/2023 Instructions given: Continue Farxiga at 10mg  daily.  Adjust Tresiba U-200 to 40 units qAM and 50 units qPM Take Novolog at 7+1 at Breakfast and 8+1 at [employer], as written below     MD- Note pt getting Prednisone 40 mg daily  Please consider starting Novolog Meal Coverage:  Novolog 6 units TID with meals HOLD if pt NPO HOLD if pt eats <50% meals    --Will follow patient during hospitalization--  Ambrose Finland RN, MSN, CDCES Diabetes Coordinator Inpatient Glycemic Control Team Team Pager: [phone] (8a-5p)

## 2023-06-08 NOTE — Progress Notes (Signed)
Mobility Specialist - Progress Note  (2L [place]) Pre-mobility: 61 bpm HR, 100% SpO2 During mobility: 77 bpm HR, 96% SpO2 Post-mobility: 67 bpm HR, 97% SPO2   06/08/23 1112  Mobility  Activity Ambulated with assistance in hallway  Level of Assistance Contact guard assist, steadying assist  Assistive Device Front wheel walker  Distance Ambulated (ft) 50 ft  Range of Motion/Exercises Active  Activity Response Tolerated fair  Mobility Referral Yes  Mobility visit 1 Mobility  Mobility Specialist Start Time (ACUTE ONLY) 1055  Mobility Specialist Stop Time (ACUTE ONLY) 1112  Mobility Specialist Time Calculation (min) (ACUTE ONLY) 17 min   Pt was found on recliner chair and agreeable to ambulate. Stated feeling BLE weakness and having a migraine during session. Had x2 brief standing rest breaks due to migraine. At EOS returned to bed with all needs met. Call bell in reach.  Billey Chang Mobility Specialist

## 2023-06-08 NOTE — Progress Notes (Signed)
   06/08/23 2238  BiPAP/CPAP/SIPAP  BiPAP/CPAP/SIPAP Pt Type Adult  BiPAP/CPAP/SIPAP Resmed  Mask Type Full face mask  Mask Size Medium  Flow Rate 2 lpm  Patient Home Equipment No  Auto Titrate Yes (7-25)

## 2023-06-08 NOTE — Consult Note (Signed)
Cardiology Consultation   Patient ID: Mike Hunter MRN: [id number]; DOB: 11/22/1950  Admit date: 06/06/2023 Date of Consult: 06/08/2023  PCP:  Mike Hunter, Mike Russell, MD   [facility] Providers Cardiologist:  [facility] Dr Mike Hunter    Patient Profile:   Mike Hunter is a 72 y.o. male with a hx of nonobstructive CAD, chronic chest pain, moderate aortic stenosis, HFpEF,  paroxysmal atrial fibrillation on Xarelto, carotid stenosis s/p left carotid stent placement 06/04/2023, oxygen dependent COPD, CKD stage III, type 2 diabetes, hypertension, hyperlipidemia, bipolar disorder, an if she she not she is not scheduled she is not scheduled  He. bipolar disorder, anxiety with depressionxiety with depression, OSA,  who is being seen 06/08/2023 for the evaluation of CHF and aortic stenosis at the request of Dr. Hanley Hunter.  History of Present Illness:   Mr. Mike Hunter was above past medical history presented to the ER for cough, SOB, and wheezing. He states he has not been feeling well for the past 3-4 days. He c/o increased wheezing and cough at home, SOB at rest, and chest tightness during coughing spells. He had recent left carotid stent placement 06/04/23 at [facility] and was concerned for TIA post procedure. EMS noted patient had a fever en route to ER.   He states he has chronic chest pain for 4-5 years, pain is at left side of the chest, sharp in quality, last from 5-10 minutes to 24 hours. He does not note any specific trigger for these pain episodes. He felt pain is occurring almost daily. He reports ongoing dizziness and fatigue whenever he moves around, he denied syncope but felt he has come close to that point. He notes intermittent leg swelling. He states his primary cardiologist Dr Flonnie Hailstone had recommended cardiac catheterization in the past but her colleague had cancelled the workup and felt this was not needed. He states Dr Flonnie Hailstone was also concerned of his aortic stenosis He expressed wish  to switch cardiology to [facility]. He has quit smoking 55 years ago, admit hx of COPD, states he has a lot of medical problems and wishes to make it to 83. He states he takes amiodarone and xarelto for A fib. He also takes coreg, imdur, spironolactone for CHF. He is compliant with his meds.   Admission diagnostic from 06/06/23 showed bicarb 20, glucose 186, Cr 1.62, albumin 3.2, TB 1.2, eGFR 44. BNP 485. Hs trop 37>42. Lactic acid WNL. WBC 24400, Hgb 10.6. INR 1.3. Procal 0.86. He was admitted to medicine for sepsis 2/2 HCAP, started on antibiotic. Repeat labs today showed Cr 1.38, glucose 221, bicarb 19, GFR 54. WBC 13900, Hgb 9. CXR showed Asymmetric opacification within the right mid lung in the perihilar distribution. Differential considerations include pneumonia versus asymmetric pulmonary edema. Cardiology is consulted for further evaluation today for CHF and aortic stenosis.    Per chart review, patient follows Dr Flonnie Hailstone from [facility] for cardiology, last seen by cardiology Dr. Tollie Hunter on 06/03/23 as a inpatient consultation due to abnormal dobutamine stress echocardiogram and preop for left carotid stent placement.  Reportedly patient has chronic chest pain over a decade, had heart catheterization 2019 revealing minimal nonobstructive CAD.  He typically gets chest pain every night lasting up to 8 hours and this has not changed since heart catheterization in 2019.    Nuclear stress test from 02/2023 was unremarkable.    Echocardiogram from 03/03/2023 revealed LVEF 55 to 60%, hypokinesis of basal LV inferior lateral wall and  mid LV inferior lateral wall, moderate aortic stenosis with mean gradient 19 mmHg and peak gradient 32 mmHg, aortic valve area 0.81 cm2, VTI 0.27, mild MR, trace TR.    Dobutamine stress echo from 05/27/2023 revealed baseline basal inferior and inferior lateral hypokinesis became akinetic, baseline normal anterior wall become hypokinetic, likely severe stenosis of dominant  LCfx, vs RCA and Cfx stenosis. Test stopped due to severe CP and evidence of ischemia. The patient achieved 51 % of maximum predicted heart rate. AVA 1.2 cm at rest and after dobutamine with adequate increase in SV consistent with Moderate AS.    It was felt his chest pain is not angina in nature and he was recommended no further cardiac testing before carotid artery stent placement.   He had a Holter monitor 04/14/2023 at [facility], revealed predominant sinus rhythm, SVT, PAC, and PVCs.  He has paroxysmal atrial fibrillation and is historically maintained on amiodarone and Xarelto.  For HFpEF he is historically maintained on torsemide 40 mg daily, carvedilol 12.5 mg twice daily, Imdur 30 mg daily, spironolactone 25 mg daily.    He underwent left carotid stent placement 06/04/2023 for symptomatic left ICA stenosis at [facility], course was complicated by developing new onset right facial droop, right facial numbness, right-sided weakness.  He was monitored in Neuro ICU, not a candidate for TNK.  No discharge summary available at this time to elaborate above hospitalization.    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 Authorizing Provider  albuterol (PROVENTIL HFA;VENTOLIN HFA) 108 (90 BASE) MCG/ACT inhaler Inhale 2 puffs into the lungs every 4 (four) hours as needed for wheezing.   Yes  [provider]  amiodarone (PACERONE) 200 MG tablet Take 200 mg by mouth 2 (two) times daily. 04/22/22  Yes [provider]  atorvastatin (LIPITOR) 40 MG tablet Take 40 mg by mouth every evening.    Yes [provider]  budesonide (PULMICORT) 0.5 MG/2ML nebulizer solution Take 2 mLs by nebulization 2 (two) times daily.   Yes [provider]  budesonide-formoterol (SYMBICORT) 160-4.5 MCG/ACT inhaler Inhale 2 puffs into the lungs 2 (two) times daily. 07/03/16  Yes [provider]  carvedilol (COREG) 6.25 MG tablet Take 12.5 mg by mouth 2 (two) times daily with a meal.    Yes [provider]  clopidogrel (PLAVIX) 75 MG tablet Take 1 tablet by mouth daily. 05/18/23 05/17/24 Yes [provider]  donepezil (ARICEPT) 10 MG tablet Take 10 mg by mouth at bedtime.   Yes [provider]  eszopiclone (LUNESTA) 2 MG TABS tablet Take 2 mg by mouth at bedtime as needed. 04/23/23  Yes [provider]  insulin aspart (NOVOLOG FLEXPEN) 100  UNIT/ML injection Inject 26-44 Units into the skin See admin instructions. 5 units prior to meals for cbg>= 150  Sliding scale (base is 26u in the AM, 30u at noon, and  36-44 at night   Yes [provider]  Insulin Degludec (TRESIBA FLEXTOUCH [place]) Inject 40-50 Units into the skin See admin instructions. 40 morning, 50 night   Yes [provider]  isosorbide mononitrate (IMDUR) 30 MG 24 hr tablet Take 1 tablet by mouth daily. 01/22/22  Yes [provider]  latanoprost (XALATAN) 0.005 % ophthalmic solution Place 1 drop into both eyes at bedtime.     Yes [provider]  memantine (NAMENDA) 5 MG tablet Take 5 mg by mouth 2 (two) times daily.   Yes [provider]  mirabegron ER (MYRBETRIQ) 50 MG TB24 tablet Take 1 tablet by mouth daily. 12/05/22 11/30/23 Yes [provider]  montelukast (SINGULAIR) 10 MG tablet Take 10 mg by mouth at bedtime.   Yes [provider]  omeprazole (PRILOSEC) 40 MG capsule Take 1 capsule by mouth at bedtime. 02/02/17  Yes [provider]  oxybutynin (DITROPAN-XL) 10 MG 24 hr tablet Take 10 mg by mouth daily.   Yes [provider]  sertraline (ZOLOFT) 50 MG tablet Take 50 mg by mouth every morning.    Yes [provider]  spironolactone (ALDACTONE) 25 MG tablet Take 25 mg by mouth daily.   Yes [provider]  torsemide (DEMADEX) 20 MG tablet Take 40 mg by mouth 2 (two) times daily.   Yes [provider]  [place] INHUB 250-50 MCG/ACT AEPB Inhale 1 puff into the lungs in the morning and at bedtime. 04/20/23  Yes [provider]  XARELTO 20 MG TABS tablet Take 20 mg by mouth daily with supper. 04/04/22  Yes [provider]    Inpatient Medications: Scheduled Meds:  amiodarone  200 mg Oral BID   atorvastatin  40 mg Oral QPM   budesonide  2 mL Nebulization BID   carvedilol  12.5 mg Oral BID WC   clopidogrel  75 mg Oral Daily   dextromethorphan-guaiFENesin  1 tablet Oral BID   donepezil  10 mg Oral QHS   furosemide  80 mg Intravenous Daily   insulin aspart  0-15 Units Subcutaneous TID WC   insulin aspart  0-5 Units Subcutaneous QHS   insulin glargine-yfgn  40 Units Subcutaneous BID   ipratropium-albuterol  3 mL Nebulization TID   isosorbide mononitrate  30 mg Oral Daily   latanoprost  1 drop Both Eyes QHS   memantine  5 mg Oral BID   mirabegron ER  50 mg Oral Daily   mometasone-formoterol  2 puff Inhalation BID   montelukast  10 mg Oral QHS   oxybutynin  10 mg Oral Daily   pantoprazole  40 mg Oral Daily   predniSONE  40 mg Oral Q breakfast   rivaroxaban  20 mg Oral Q supper   sertraline  50 mg Oral BH-q7a   spironolactone  25 mg Oral Daily   Continuous Infusions:  ceFEPime (MAXIPIME) IV     vancomycin 1,250 mg (06/08/23 1023)   PRN Meds: acetaminophen **OR** acetaminophen, albuterol, ondansetron **OR** ondansetron (ZOFRAN) IV,  senna-docusate, zolpidem  Allergies:    Allergies  Allergen Reactions   Buprenorphine Hcl Nausea And Vomiting    Other reaction(s): Nausea And Vomiting    Hydromorphone Other (See Comments)    Other reaction(s): Mental Status Changes (intolerance) Other reaction(s): HALLUCINATIONS hallucinations hallucinations  hallucinations hallucinations    Codeine Nausea And Vomiting   Mirtazapine     Other reaction(s): Other (See Comments) Nightmares & altered mental status    Morphine And Codeine Nausea And Vomiting    Social History:   Social History   Socioeconomic History   Marital status: Married    Spouse name: Not on file   Number of children: Not on file   Years of education: Not on file   Highest education level: Not on file  Occupational History   Not on file  Tobacco Use   Smoking status: Former   Smokeless tobacco: Never  Vaping Use   Vaping status: Never Used  Substance and Sexual Activity   Alcohol use: No   Drug use: No   Sexual activity: Not on file  Other Topics Concern   Not on file  Social History Narrative   Not on file   Social Drivers of Health   Financial Resource Strain: Not on file  Food Insecurity: No Food Insecurity (06/06/2023)   Hunger Vital Sign    Worried About Running Out of Food in the Last Year: Never true    Ran Out of Food in the Last Year: Never true  Transportation Needs: No Transportation Needs (06/06/2023)   PRAPARE - Administrator, Civil Service (Medical): No    Lack of Transportation (Non-Medical): No  Physical Activity: Not on file  Stress: Not on file  Social Connections: Not on file  Intimate Partner Violence: Not At Risk (06/06/2023)   Humiliation, Afraid, Rape, and Kick questionnaire    Fear of Current or Ex-Partner: No    Emotionally Abused: No    Physically Abused: No    Sexually Abused: No    Family History:   Mother: CAD   ROS:  Constitutional: fatigue, fever Eyes: Denied vision change or  loss Ears/Nose/Mouth/Throat: Denied ear ache, sore throat, sinus pain Cardiovascular: see HPI  Respiratory: see HPI  Gastrointestinal: Denied nausea, vomiting, abdominal pain, diarrhea Genital/Urinary: Denied dysuria, hematuria, urinary frequency/urgency Musculoskeletal: Denied muscle ache, joint pain Skin: Denied rash, wound Neuro: see HPI  Psych: history of depression/anxiety  Endocrine: history of diabetes   Physical Exam/Data:   Vitals:   06/07/23 2051 06/08/23 0447 06/08/23 0500 06/08/23 0920  BP:  116/63    Pulse:  64    Resp:  20    Temp:  97.7 F (36.5 C)    TempSrc:  Oral    SpO2: 95% 95%  97%  Weight:   122.1 kg   Height:        Intake/Output Summary (Last 24 hours) at 06/08/2023 1143 Last data filed at 06/08/2023 0914 Gross per 24 hour  Intake 690 ml  Output 2700 ml  Net -2010 ml      06/08/2023    5:00 AM 06/07/2023    5:00 AM 06/06/2023    4:49 PM  Last 3 Weights  Weight (lbs) 269 lb 2.9 oz 269 lb 13.5 oz 268 lb 15.4 oz  Weight (kg) 122.1 kg 122.4 kg 122 kg     Body mass index is 37.54 kg/m.   Vitals:  Vitals:   06/08/23 0447 06/08/23 0920  BP: 116/63   Pulse: 64   Resp: 20   Temp: 97.7 F (36.5 C)   SpO2: 95% 97%   General Appearance: In no apparent distress, sitting in chair  HEENT: Normocephalic, atraumatic.  Neck: Supple, trachea midline, no JVDs Cardiovascular: Regular rate and rhythm, normal S1-S2,  systolic  murmur grade II RUSB Respiratory: Resting breathing unlabored, lungs sounds clear but diminished at base to auscultation bilaterally, no use of accessory muscles. On [place] oxygen  Gastrointestinal: Bowel sounds positive, abdomen obese Extremities: Able to move all extremities in bed without difficulty, no edema of BLE Musculoskeletal: Normal muscle bulk and tone Skin: Intact, warm, dry. No rashes or petechiae noted in exposed areas.  Neurologic: Alert, oriented to person, place and time.  no gross focal neuro deficit Psychiatric:  Normal affect. Mood is appropriate.     EKG:  The EKG was personally reviewed and demonstrates:    EKG from 06/06/23 at 1825 showed sinus rhythm 76bpm, manual QT , non-specific T wave abnormalities   Telemetry:  Telemetry was personally reviewed and demonstrates:    Sinus rhythm /bradycardia high 50 to 60bpm, occasional PVCs    Relevant CV Studies:   Echo stress test 05/27/23:  SUMMARY  The patient had 12-21-08 chest pain, central tightness and heaviness. Baseline basal inferior and  inferolateral hypokinesis became akinetic [possibly hibernating myocardium], baseline normal  anterior wall became hypokinetic [?], likely severe stenosis of dominant LCfx, vs RCA and Cfx  stenosis. Test stopped due to severe CP and evidence of ischemia. The patient achieved 51 % of  maximum predicted heart rate.  AVA 1.2 cm at rest and after dobutamine with adequate increase in SV- Moderate AS.  -   TEE 03/03/23:  Left ventricular systolic function is normal.  LV ejection fraction = 55-60%.  There are regional wall motion abnormalities as specified below.  There is basal LV inferolateral wall hypokinesis  There is mid LV inferolateral wall hypokinesis  There is moderate to severe aortic stenosis.  Aortic valve mean pressure gradient is 19 mmHg.  Aortic valve peak pressure gradient is 32 mmHg.  The calculated aortic valve area using the continuity equation is 0.81 cm2.  AS dimensionless index  VTI  is 0.27.  There is mild mitral regurgitation.  There is trace tricuspid regurgitation.  Trace pulmonic valvular regurgitation.  IVC size was normal.  There is no pericardial effusion.  Compared to prior study, aortic valve stenosis has worsened. Wall motion abnormality is new.    Cath 01/27/18:    Cardiac Arteries and Lesion Findings  LMCA: Normal appearance with 0% stenosis.  LAD:   Lesion on Prox LAD: 10% stenosis 5 mm length .    Lesion on Mid LAD: 20% stenosis 10 mm length .  LCx:  Normal appearance with 0% stenosis.  RCA: Normal appearance with 0% stenosis.   Laboratory Data:  High Sensitivity Troponin:   Recent Labs  Lab 06/06/23 1218 06/06/23 1518  TROPONINIHS 37* 42*     Chemistry Recent Labs  Lab 06/06/23 1218 06/07/23 0427 06/08/23 0406  NA 137 138 138  K 3.7 3.4* 4.0  CL 107 107 107  CO2 20* 21* 19*  GLUCOSE 186* 198* 221*  BUN 22 25* 35*  CREATININE 1.62* 1.49* 1.38*  CALCIUM 8.6* 8.3* 8.8*  MG  --  2.4 2.6*  GFRNONAA 44* 49* 54*  ANIONGAP 10 10 12     Recent Labs  Lab 06/06/23 1218  PROT 7.5  ALBUMIN 3.2*  AST 29  ALT 41  ALKPHOS 84  BILITOT 1.2*   Lipids No results for input(s): "CHOL", "TRIG", "HDL", "LABVLDL", "LDLCALC", "CHOLHDL" in the last 168 hours.  Hematology Recent Labs  Lab 06/06/23 1218 06/07/23 0427 06/08/23 0406  WBC 24.4* 17.8* 13.9*  RBC 4.59 4.04* 3.81*  HGB 10.6* 9.5* 9.0*  HCT 35.4* 32.5* 30.5*  MCV 77.1* 80.4 80.1  MCH 23.1* 23.5* 23.6*  MCHC 29.9* 29.2* 29.5*  RDW 16.7* 16.7* 16.4*  PLT 337 288 290   Thyroid No results for input(s): "TSH", "FREET4" in the last 168 hours.  BNP Recent Labs  Lab 06/06/23 1218  BNP 485.2*    DDimer No results for input(s): "DDIMER" in the last 168 hours.   Radiology/Studies:  DG Chest 2 View Result Date: 06/06/2023 CLINICAL DATA:  Shortness of breath, chest pain and cough. EXAM: CHEST - 2 VIEW COMPARISON:  03/02/2023 FINDINGS: Stable cardiomediastinal contours. Small right pleural effusion. Asymmetric opacification within the right mid lung in the perihilar distribution is identified. Visualized osseous structures are intact. Signs of previous ACDF within the lower cervical spine. IMPRESSION: 1. Asymmetric opacification within the right mid lung in the perihilar distribution. Differential considerations include pneumonia versus asymmetric pulmonary edema. 2. Small right pleural effusion. Electronically Signed   By: Signa Kell M.D.   On: 06/06/2023 15:45      Assessment and Plan:   Chronic HFpEF Moderate aortic stenosis  Chronic chest pain Non-obstructive CAD  - Cath from 01/2018 minimal CAD non-obstructive  - Echo 03/03/23 with LVEF 55-60%, hypokinesis of basal LV inferolateral and mid LV inferolateral wall, moderate AS with mean gradient 19 mmHg and peak gradient and AV area 0.81 cm2 and VTI 0.27. - Dobutamine Stress Echo 05/27/23 showed ischemia, WMA suggestive of possible stenosis of dominant LCfx, vs RCA and Cfx. AVA 1.2 cm at rest and after dobutamine with adequate increase in SV- Moderate AS.  - he reports 4-5 years ongoing chest pain, atypical in feature, ongoing dizziness and near syncope with movement - clinically he is euvolemic on exam today, OK to resume PTA torsemide 40mg  BID - GDMT: resume PTA coreg, imdur, spironolactone - Would allow time for recovery from sepsis 2/2 HCAP, can follow up in the office for further evaluation of chronic atypical chest pain and moderate aortic stenosis   Paroxysmal A fib - in sinus currently, continue PTA amiodarone and Xarelto   Pneumonia COPD/asthma  Bipolar disorder  AKI on CKD III DM  Anemia  - per primary team    Risk Assessment/Risk Scores:    New York Heart Association (NYHA) Functional Class NYHA Class II  CHA2DS2-VASc Score = 6   This indicates a 9.7% annual risk of stroke. The patient's score is based upon: CHF History: 1 HTN History: 1 Diabetes History: 1 Stroke History: 2 Vascular Disease History: 0 Age Score: 1 Gender Score: 0     For questions or updates, please contact [facility] Please consult [email or web address] for contact info under    Signed, Cyndi Bender, NP  06/08/2023 11:43 AM

## 2023-06-08 NOTE — Progress Notes (Signed)
PHARMACY NOTE:  ANTIMICROBIAL RENAL DOSAGE ADJUSTMENT  Current antimicrobial regimen includes a mismatch between antimicrobial dosage and estimated renal function.  As per policy approved by the Pharmacy & Therapeutics and Medical Executive Committees, the antimicrobial dosage will be adjusted accordingly.  Current antimicrobial dosage:  Cefepime 2g IV q12h  Indication: sepsis secondary to pneumonia   Renal Function:  Estimated Creatinine Clearance: 62.4 mL/min (A) (by C-G formula based on SCr of 1.38 mg/dL (H)). []      On intermittent HD, scheduled: []      On CRRT    Antimicrobial dosage has been changed to: Cefepime 2g IV q8h   Thank you for allowing pharmacy to be a part of this patient's care.  Jamse Mead, [facility] 06/08/2023 9:21 AM

## 2023-06-09 ENCOUNTER — Inpatient Hospital Stay (HOSPITAL_COMMUNITY): Payer: Medicare HMO

## 2023-06-09 DIAGNOSIS — I5032 Chronic diastolic (congestive) heart failure: Secondary | ICD-10-CM | POA: Diagnosis not present

## 2023-06-09 DIAGNOSIS — J189 Pneumonia, unspecified organism: Secondary | ICD-10-CM | POA: Diagnosis not present

## 2023-06-09 DIAGNOSIS — A419 Sepsis, unspecified organism: Secondary | ICD-10-CM | POA: Diagnosis not present

## 2023-06-09 LAB — CBC WITH DIFFERENTIAL/PLATELET
Abs Immature Granulocytes: 0.15 10*3/uL — ABNORMAL HIGH (ref 0.00–0.07)
Basophils Absolute: 0.1 10*3/uL (ref 0.0–0.1)
Basophils Relative: 0 %
Eosinophils Absolute: 0 10*3/uL (ref 0.0–0.5)
Eosinophils Relative: 0 %
HCT: 32.2 % — ABNORMAL LOW (ref 39.0–52.0)
Hemoglobin: 9.5 g/dL — ABNORMAL LOW (ref 13.0–17.0)
Immature Granulocytes: 1 %
Lymphocytes Relative: 5 %
Lymphs Abs: 0.7 10*3/uL (ref 0.7–4.0)
MCH: 23.6 pg — ABNORMAL LOW (ref 26.0–34.0)
MCHC: 29.5 g/dL — ABNORMAL LOW (ref 30.0–36.0)
MCV: 80.1 fL (ref 80.0–100.0)
Monocytes Absolute: 0.8 10*3/uL (ref 0.1–1.0)
Monocytes Relative: 5 %
Neutro Abs: 14.1 10*3/uL — ABNORMAL HIGH (ref 1.7–7.7)
Neutrophils Relative %: 89 %
Platelets: 311 10*3/uL (ref 150–400)
RBC: 4.02 MIL/uL — ABNORMAL LOW (ref 4.22–5.81)
RDW: 16.5 % — ABNORMAL HIGH (ref 11.5–15.5)
WBC: 15.8 10*3/uL — ABNORMAL HIGH (ref 4.0–10.5)
nRBC: 0 % (ref 0.0–0.2)

## 2023-06-09 LAB — GLUCOSE, CAPILLARY
Glucose-Capillary: 222 mg/dL — ABNORMAL HIGH (ref 70–99)
Glucose-Capillary: 239 mg/dL — ABNORMAL HIGH (ref 70–99)
Glucose-Capillary: 245 mg/dL — ABNORMAL HIGH (ref 70–99)
Glucose-Capillary: 316 mg/dL — ABNORMAL HIGH (ref 70–99)

## 2023-06-09 LAB — BASIC METABOLIC PANEL
Anion gap: 12 (ref 5–15)
BUN: 43 mg/dL — ABNORMAL HIGH (ref 8–23)
CO2: 18 mmol/L — ABNORMAL LOW (ref 22–32)
Calcium: 8.9 mg/dL (ref 8.9–10.3)
Chloride: 110 mmol/L (ref 98–111)
Creatinine, Ser: 1.21 mg/dL (ref 0.61–1.24)
GFR, Estimated: >60 mL/min (ref >60)
Glucose, Bld: 276 mg/dL — ABNORMAL HIGH (ref 70–99)
Potassium: 4.1 mmol/L (ref 3.5–5.1)
Sodium: 140 mmol/L (ref 135–145)

## 2023-06-09 LAB — CULTURE, RESPIRATORY W GRAM STAIN: Gram Stain: NONE SEEN

## 2023-06-09 LAB — ECHOCARDIOGRAM COMPLETE
AR max vel: 1.48 cm2
AV Area VTI: 1.34 cm2
AV Area mean vel: 1.3 cm2
AV Mean grad: 22 mm[Hg]
AV Peak grad: 34.1 mm[Hg]
Ao pk vel: 2.92 m/s
Area-P 1/2: 2.31 cm2
Height: 71 in
S' Lateral: 4.2 cm
Weight: 4306.91 [oz_av]

## 2023-06-09 LAB — MAGNESIUM: Magnesium: 2.5 mg/dL — ABNORMAL HIGH (ref 1.7–2.4)

## 2023-06-09 LAB — LEGIONELLA PNEUMOPHILA SEROGP 1 UR AG: L. pneumophila Serogp 1 Ur Ag: NEGATIVE

## 2023-06-09 MED ORDER — IPRATROPIUM-ALBUTEROL 0.5-2.5 (3) MG/3ML IN SOLN
RESPIRATORY_TRACT | Status: AC
  Filled 2023-06-09: qty 3

## 2023-06-09 MED ORDER — PERFLUTREN LIPID MICROSPHERE
1.0000 mL | INTRAVENOUS | Status: AC | PRN
  Administered 2023-06-09: 3 mL via INTRAVENOUS

## 2023-06-09 MED ORDER — FUROSEMIDE 10 MG/ML IJ SOLN
80.0000 mg | Freq: Two times a day (BID) | INTRAMUSCULAR | Status: DC
  Administered 2023-06-09 – 2023-06-10 (×2): 80 mg via INTRAVENOUS
  Filled 2023-06-09 (×2): qty 8

## 2023-06-09 MED ORDER — TORSEMIDE 20 MG PO TABS: 40.0000 mg | ORAL_TABLET | Freq: Two times a day (BID) | ORAL | Status: DC

## 2023-06-09 MED ORDER — INSULIN GLARGINE-YFGN 100 UNIT/ML ~~LOC~~ SOLN
45.0000 [IU] | Freq: Two times a day (BID) | SUBCUTANEOUS | Status: DC
  Administered 2023-06-09 – 2023-06-10 (×2): 45 [IU] via SUBCUTANEOUS
  Filled 2023-06-09 (×3): qty 0.45

## 2023-06-09 NOTE — Progress Notes (Signed)
PT Cancellation Note  Patient Details Name: Mike Hunter MRN: [id number] DOB: 1951/04/21   Cancelled Treatment:    Reason Eval/Treat Not Completed: Patient at procedure or test/unavailable, will check back. Blanchard Kelch PT Acute Rehabilitation Services Office [phone] Weekend pager-[phone]    Rada Hay 06/09/2023, 10:46 AM

## 2023-06-09 NOTE — Evaluation (Signed)
Physical Therapy Evaluation Patient Details Name: Mike Hunter MRN: [id number] DOB: Jan 16, 1951 Today's Date: 06/09/2023  History of Present Illness  Pt is 72 yo male admitted on 06/06/23 with Sepsis associated with PNE, acute CHF, COPD exacerbation.  Pt with hx including but not limited to persistent asthma, DM II, class II obesity, HLD, HTN, GERD, COPD on 1 L [place] at night, OSA on CPAP, AAA, left carotid stenosis s/p stent placement on 12/19, CKD 3A, paroxysmal A-fib on Xarelto, BPH, urge incontinence, bipolar disorder, anxiety and depression, episodic mood disorder, CAD, non-rheumatic aortic valve stenosis, osteoarthritis and chronic diastolic HF.  Pt reports TKA 11/2022  Clinical Impression  Pt admitted with above diagnosis. At baseline, pt ambulatory without AD in home and uses cane or RW for short community distances.  He has support and DME at home.  Does have a hx of recent falls.  Today, pt was able to ambulate 30' with RW and CGA.  His VSS with activity on RA, but he did fatigue easily.  Pt had decreased strength in bil LE (R worse than L)-reports was told he may have had a stroke but not confirmed a few weeks ago.   Pt currently with functional limitations due to the deficits listed below (see PT Problem List). Pt will benefit from acute skilled PT to increase their independence and safety with mobility to allow discharge.  Recommend HHPT at d/c.          If plan is discharge home, recommend the following: A little help with walking and/or transfers;A little help with bathing/dressing/bathroom;Assistance with cooking/housework;Help with stairs or ramp for entrance   Can travel by private vehicle        Equipment Recommendations None recommended by PT  Recommendations for Other Services       Functional Status Assessment Patient has had a recent decline in their functional status and demonstrates the ability to make significant improvements in function in a reasonable and predictable  amount of time.     Precautions / Restrictions Precautions Precautions: Fall      Mobility  Bed Mobility Overal bed mobility: Needs Assistance Bed Mobility: Supine to Sit     Supine to sit: HOB elevated, Supervision, Used rails          Transfers Overall transfer level: Needs assistance Equipment used: Rolling walker (2 wheels) Transfers: Sit to/from Stand Sit to Stand: Contact guard assist           General transfer comment: CGA safety    Ambulation/Gait Ambulation/Gait assistance: Contact guard assist Gait Distance (Feet): 75 Feet Assistive device: Rolling walker (2 wheels) Gait Pattern/deviations: Step-through pattern, Decreased stride length Gait velocity: decreased but functional     General Gait Details: Did have chair follow b/c pt reports got real weak earlier today walking and had to sit.  This trip pt was able to ambulate 55' with 1 standing rest break.  No major episodes of weakness and all VSS (See comments).  Min cues for posture  Stairs            Wheelchair Mobility     Tilt Bed    Modified Rankin (Stroke Patients Only)       Balance Overall balance assessment: Needs assistance, History of Falls Sitting-balance support: No upper extremity supported Sitting balance-Leahy Scale: Good     Standing balance support: Bilateral upper extremity supported, Reliant on assistive device for balance Standing balance-Leahy Scale: Poor Standing balance comment: Steady with RW  Pertinent Vitals/Pain Pain Assessment Pain Assessment: No/denies pain    Home Living Family/patient expects to be discharged to:: Private residence Living Arrangements: Spouse/significant other Available Help at Discharge: Family;Available 24 hours/day Type of Home: House Home Access: Stairs to enter Entrance Stairs-Rails: Right Entrance Stairs-Number of Steps: 3   Home Layout: One level Home Equipment: Agricultural consultant (2  wheels);Shower seat;Grab bars - tub/shower;Grab bars - toilet Additional Comments: walking stick ; wears 2-5 L O2 at night , CPAP at night but if gets short of breath during day will put it on.    Prior Function               Mobility Comments: Could ambulate in home without AD; Using RW or cane for short community distances; rides carts at grocery store; does report several falls last 6 months ADLs Comments: Pt independent with ADLs except supervision to get in shower; Can assist with IADLs     Extremity/Trunk Assessment   Upper Extremity Assessment Upper Extremity Assessment: Overall WFL for tasks assessed    Lower Extremity Assessment Lower Extremity Assessment: LLE deficits/detail;RLE deficits/detail RLE Deficits / Details: ROM WFL; MMT 4/5 ; pt reports when he had carotid stent earlier this month he was told he may have had a stroke but was not confirmed (states R side weak and some facial droop) LLE Deficits / Details: ROM WFL; MMT hip 4+/5, knee 4+/5, ankle DF 5/5    Cervical / Trunk Assessment Cervical / Trunk Assessment: Normal  Communication      Cognition Arousal: Alert Behavior During Therapy: WFL for tasks assessed/performed Overall Cognitive Status: Within Functional Limits for tasks assessed                                 General Comments: Very pleasant, motivated, likes to joke        General Comments General comments (skin integrity, edema, etc.): Pt on 2 L O2.  Request to try on RA.  Sats on RA >97% throughout.  HR 60's-70's throughout evaluation. Checked BP supine, sit, stand, during walk and all stable 130's-140's/70's.    Exercises     Assessment/Plan    PT Assessment Patient needs continued PT services  PT Problem List Decreased strength;Cardiopulmonary status limiting activity;Decreased range of motion;Decreased activity tolerance;Decreased knowledge of use of DME;Decreased balance;Decreased mobility       PT Treatment  Interventions Therapeutic exercise;DME instruction;Gait training;Balance training;Stair training;Functional mobility training;Therapeutic activities;Patient/family education;Modalities    PT Goals (Current goals can be found in the Care Plan section)  Acute Rehab PT Goals Patient Stated Goal: return home PT Goal Formulation: With patient/family Time For Goal Achievement: 06/23/23 Potential to Achieve Goals: Good    Frequency Min 1X/week     Co-evaluation               AM-PAC PT "6 Clicks" Mobility  Outcome Measure Help needed turning from your back to your side while in a flat bed without using bedrails?: A Little Help needed moving from lying on your back to sitting on the side of a flat bed without using bedrails?: A Little Help needed moving to and from a bed to a chair (including a wheelchair)?: A Little Help needed standing up from a chair using your arms (e.g., wheelchair or bedside chair)?: A Little Help needed to walk in hospital room?: A Little Help needed climbing 3-5 steps with a railing? : A Little 6 Click Score:  18    End of Session Equipment Utilized During Treatment: Gait belt Activity Tolerance: Patient tolerated treatment well Patient left: with chair alarm set;in chair;with call bell/phone within reach;with family/visitor present Nurse Communication: Mobility status PT Visit Diagnosis: Other abnormalities of gait and mobility (R26.89);Muscle weakness (generalized) (M62.81);History of falling (Z91.81)    Time: 1660-6301 PT Time Calculation (min) (ACUTE ONLY): 30 min   Charges:   PT Evaluation $PT Eval Low Complexity: 1 Low PT Treatments $Gait Training: 8-22 mins PT General Charges $$ ACUTE PT VISIT: 1 Visit         Anise Salvo, PT Acute Rehab [facility] [phone]   Rayetta Humphrey 06/09/2023, 2:43 PM

## 2023-06-09 NOTE — TOC Initial Note (Addendum)
Transition of Care [facility]) - Initial/Assessment Note    Patient Details  Name: Mike Hunter MRN: [id number] Date of Birth: 28-Jan-1951  Transition of [facility] CM/SW Contact:    Larrie Kass, LCSW Phone Number: 06/09/2023, 12:50 PM  Clinical Narrative:                  Pt from home , patient rec for PT eval. TOC to follow for d/c needs.  ADDEn 3:30pm  CSW spoke with pt regarding rec for home health services. Pt has agreed and would like [facility], due to having the agency in the past. Pt denies any transportation and DME needs.   [facility] accepted pt for HHPT, pt will need HH orders, MD made aware. No further TOC needs , TOC sign off.    Barriers to Discharge: Continued Medical Work up   Patient Goals and CMS Choice            Expected Discharge Plan and Services                                              Prior Living Arrangements/Services                       Activities of Daily Living   ADL Screening (condition at time of admission) Independently performs ADLs?: Yes (appropriate for developmental age) Is the patient deaf or have difficulty hearing?: Yes Does the patient have difficulty seeing, even when wearing glasses/contacts?: No Does the patient have difficulty concentrating, remembering, or making decisions?: Yes  Permission Sought/Granted                  Emotional Assessment              Admission diagnosis:  Sepsis due to pneumonia (HCC) [J18.9, A41.9] Pneumonia of right lung due to infectious organism, unspecified part of lung [J18.9] Sepsis, due to unspecified organism, unspecified whether acute organ dysfunction present [facility]) [A41.9] Patient Active Problem List   Diagnosis Date Noted   Moderate persistent asthma with exacerbation 06/07/2023   COPD with acute exacerbation (HCC) 06/07/2023   Pneumonia of right lung due to infectious organism 06/07/2023   Sepsis due to pneumonia (HCC) 06/06/2023    Hypokalemia 09/26/2018   Hypomagnesemia 09/26/2018   Acute on chronic diastolic CHF (congestive heart failure) (HCC) 09/26/2018   CAD (coronary artery disease) 09/26/2018   Asthma 09/26/2018   Depression with anxiety 09/26/2018   Asthma with status asthmaticus    AKI (acute kidney injury) (HCC) 09/25/2018   Type II or unspecified type diabetes mellitus with neurological manifestations, not stated as uncontrolled(250.60) 02/09/2013   Unspecified urinary incontinence 02/09/2013   Esophageal reflux 02/09/2013   Insulin-requiring or dependent type II diabetes mellitus (HCC) 12/30/2012   Pyogenic arthritis of knee (HCC) 12/30/2012   PCP:  Lenox Ponds, MD Pharmacy:   [id number] - [place], [place] - [address]OSS [address] [place] [address] Phone: [phone] Fax: [phone]  [facility] [address] [place] [address] Phone: [phone] Fax: [phone]     Social Drivers of Health (SDOH) Social History: SDOH Screenings   Food Insecurity: No Food Insecurity (06/06/2023)  Housing: Low Risk  (06/06/2023)  Transportation Needs: No Transportation Needs (06/06/2023)  Utilities: Not At  Risk (06/06/2023)  Tobacco Use: Medium Risk (06/06/2023)   SDOH Interventions:     Readmission Risk Interventions     No data to display

## 2023-06-09 NOTE — Progress Notes (Signed)
Patient Name: Mike Hunter Date of Encounter: 06/09/2023 [facility] Cardiologist: [facility]   Interval Summary  .    Mike Hunter is a 72 y.o. male with a hx of nonobstructive CAD, chronic chest pain, moderate aortic stenosis, HFpEF,  paroxysmal atrial fibrillation on Xarelto, carotid stenosis s/p left carotid stent placement 06/04/2023, oxygen dependent COPD, CKD stage III, type 2 diabetes, hypertension, hyperlipidemia, bipolar disorder,  bipolar disorder, anxiety with depression, OSA, who is admitted for sepsis 2/2 HCAP, cardiology is consulted on chronic CHF and aortic stenosis.   Patient states he is a very sick man, has a lot of coughing today, otherwise doing OK. He wants to see Dr Wyline Mood from this point on.   Vital Signs .    Vitals:   06/09/23 0412 06/09/23 0500 06/09/23 0839 06/09/23 0846  BP: 133/77  124/66   Pulse: 61  65   Resp: 19  (!) 22   Temp: 97.6 F (36.4 C)  98.2 F (36.8 C)   TempSrc: Oral  Oral   SpO2: 98%  100% 99%  Weight:  122.1 kg    Height:        Intake/Output Summary (Last 24 hours) at 06/09/2023 0939 Last data filed at 06/09/2023 0300 Gross per 24 hour  Intake 408.17 ml  Output 700 ml  Net -291.83 ml      06/09/2023    5:00 AM 06/08/2023    5:00 AM 06/07/2023    5:00 AM  Last 3 Weights  Weight (lbs) 269 lb 2.9 oz 269 lb 2.9 oz 269 lb 13.5 oz  Weight (kg) 122.1 kg 122.1 kg 122.4 kg      Telemetry/ECG    Sinus bradycardia /rhythm 50-70s - Personally Reviewed  Physical Exam .   GEN: No acute distress.  Obese Neck: No JVD Cardiac: RRR, systolic murmur grade II RUSB Respiratory: Fine crackles bilaterally, on [place] oxygen, speaks full sentence  GI: Soft, nontender, non-distended  MS: No leg edema  Assessment & Plan .     Chronic HFpEF Moderate aortic stenosis  Chronic chest pain Non-obstructive CAD  - Cath from 01/2018 minimal CAD non-obstructive [facility] - [facility] 03/03/23 with LVEF 55-60%, hypokinesis of  basal LV inferolateral and mid LV inferolateral wall, moderate AS with mean gradient 19 mmHg and peak gradient and AV area 0.81 cm2 and VTI 0.27. - Dobutamine Stress Echo at [facility] 05/27/23 showed ischemia, WMA suggestive of possible stenosis of dominant LCfx, vs RCA and Cfx. AVA 1.2 cm at rest and after dobutamine with adequate increase in SV- Moderate AS.  - he reports 4-5 years ongoing chest pain, atypical in feature, ongoing dizziness and near syncope with movement - clinically he is euvolemic on exam, OK to resume PTA torsemide 40mg  BID, ordered today - GDMT: resume PTA coreg, imdur, spironolactone - Echo is pending today/will follow  - Would allow time for recovery from sepsis 2/2 HCAP, can follow up in the office for further evaluation of chronic atypical chest pain and moderate aortic stenosis, if he prefer to see [facility] going forward    Paroxysmal A fib - in sinus currently, continue PTA amiodarone and Xarelto    Pneumonia COPD/asthma  Bipolar disorder  AKI on CKD III DM  Anemia  - per primary team      For questions or updates, please contact [facility] Please consult [email or web address] for contact info under        Signed, Cyndi Bender,  NP

## 2023-06-09 NOTE — Progress Notes (Signed)
PROGRESS NOTE    Mike Hunter  WUJ:[id number] DOB: Oct 29, 1950 DOA: 06/06/2023 PCP: Lenox Ponds, MD   Brief Narrative:  72 yrs old male with PMH significant for moderate persistent asthma, DM II, class II obesity, HLD, HTN, GERD, COPD on 1 L [place] at night, OSA on CPAP, AAA, left carotid stenosis s/p stent placement on 12/19, CKD 3A, paroxysmal A-fib on Xarelto, BPH, urge incontinence, bipolar disorder, anxiety and depression, episodic mood disorder, CAD, non-rheumatic aortic valve stenosis, osteoarthritis and chronic diastolic presented with worsening shortness of breath.  On presentation, chest x-ray showed opacity within the right midlung concerning for pneumonia.  He was started on IV antibiotics.  Cardiology was consulted.  Assessment & Plan:   Sepsis: Present on admission Healthcare associated pneumonia -Currently on 2 L oxygen by nasal cannula.  Hemodynamically stable.  Blood cultures negative so far.  Sputum culture growing gram-positive cocci in pairs and few gram-negative rods.  Currently on cefepime.  Acute on chronic diastolic heart failure Moderate aortic stenosis -Stress echo on 05/27/2023 had showed EF of 55 to 60% with moderate  aortic stenosis -Strict input and output.  Daily weights.  Continue Lasix IV.  Continue Coreg, spironolactone, Imdur.  Negative fluid balance of 3711.8 cc since admission. -Cardiology following.  Follow recommendations.  COPD exacerbation Chronic respiratory failure with hypoxia -Continue current inhaled and nebulizer regimen.  Continue prednisone  Diabetes mellitus type 2 with hyperglycemia -Recent A1c 7.9.  Continue long-acting insulin along with CBGs with SSI.  Carb modified diet  Paroxysmal A-fib -Currently rate controlled.  Continue Coreg, Xarelto and amiodarone  Left carotid stenosis -Had stenting of his left carotic artery stenosis on 12/19 at [facility]. Continue Plavix and Xarelto -Follow-up with vascular surgery in the  outpatient  CAD with angina pectoris AAA -Patient had a positive dobutamine stress echo on 12/11. He reports intermittent chest pain. No chest pain at the moment.  -Troponins are flat. EKG without any ischemic changes. Lipid panel 3 months ago showed LDL of 40. -Continue Plavix, Imdur, atorvastatin and Xarelto -Follow-up with cardiology in the outpatient  CKD stage IIIa -Baseline creatinine of 1.4-1.6.  Currently stable  Hypokalemia -Improved  Leukocytosis -Monitor.  Anemia of chronic disease -From chronic illnesses.  Hemoglobin stable.  Monitor intermittently  BPH Urge incontinence -Continue Myrbetriq and oxybutynin.  Outpatient follow-up with urology  OSA -Continue CPAP at bedtime  Cognitive deficits Mild dementia -Continue memantine and donepezil.  Outpatient follow-up with PCP/neurology  Anxiety and depression -Continue sertraline  GERD Continue Protonix  Class II obesity -Outpatient follow-up  DVT prophylaxis: Xarelto Code Status: Full Family Communication: None at bedside Disposition Plan: Status is: Inpatient Remains inpatient appropriate because: Of severity of illness    Consultants: Cardiology  Procedures: None  Antimicrobials:  Anti-infectives (From admission, onward)    Start     Dose/Rate Route Frequency Ordered Stop   06/08/23 1800  ceFEPIme (MAXIPIME) 2 g in sodium chloride 0.9 % 100 mL IVPB        2 g 200 mL/hr over 30 Minutes Intravenous Every 8 hours 06/08/23 1004     06/07/23 1000  vancomycin (VANCOREADY) IVPB 1250 mg/250 mL  Status:  Discontinued        1,250 mg 166.7 mL/hr over 90 Minutes Intravenous Every 24 hours 06/06/23 2124 06/08/23 1359   06/06/23 2200  ceFEPIme (MAXIPIME) 2 g in sodium chloride 0.9 % 100 mL IVPB  Status:  Discontinued        2 g 200 mL/hr over 30  Minutes Intravenous Every 12 hours 06/06/23 2124 06/08/23 1004   06/06/23 1330  vancomycin (VANCOCIN) IVPB 1000 mg/200 mL premix       Placed in "Followed by"  Linked Group   1,000 mg 200 mL/hr over 60 Minutes Intravenous  Once 06/06/23 1212 06/06/23 1628   06/06/23 1215  ceFEPIme (MAXIPIME) 1 g in sodium chloride 0.9 % 100 mL IVPB        1 g 200 mL/hr over 30 Minutes Intravenous  Once 06/06/23 1209 06/06/23 1514   06/06/23 1215  vancomycin (VANCOCIN) IVPB 1000 mg/200 mL premix       Placed in "Followed by" Linked Group   1,000 mg 200 mL/hr over 60 Minutes Intravenous  Once 06/06/23 1212 06/06/23 1515        Subjective: Patient seen and examined at bedside.  Still feels short of breath with exertion but feels slightly better.  Denies any fever, chest pain or vomiting..  Objective: Vitals:   06/08/23 2021 06/08/23 2028 06/09/23 0412 06/09/23 0500  BP:   133/77   Pulse:   61   Resp:   19   Temp:   97.6 F (36.4 C)   TempSrc:   Oral   SpO2: 98% 98% 98%   Weight:    122.1 kg  Height:        Intake/Output Summary (Last 24 hours) at 06/09/2023 0832 Last data filed at 06/09/2023 0300 Gross per 24 hour  Intake 408.17 ml  Output 1450 ml  Net -1041.83 ml   Filed Weights   06/07/23 0500 06/08/23 0500 06/09/23 0500  Weight: 122.4 kg 122.1 kg 122.1 kg    Examination:  General: Currently on 2 L oxygen via nasal cannula.  No distress.  Looks chronically ill and deconditioned. ENT/neck: No thyromegaly.  JVD is not elevated  respiratory: Decreased breath sounds at bases bilaterally with some crackles; no wheezing  CVS: S1-S2 heard, rate controlled currently Abdominal: Soft, nontender, slightly distended; no organomegaly, bowel sounds are heard Extremities: Trace lower extremity edema; no cyanosis  CNS: Awake and alert.  Slow to respond.  No focal neurologic deficit.  Moves extremities Lymph: No obvious lymphadenopathy Skin: No obvious ecchymosis/lesions  psych: Mostly flat affect.  Currently not agitated. musculoskeletal: No obvious joint swelling/deformity    Data Reviewed: I have personally reviewed following labs and imaging  studies  CBC: Recent Labs  Lab 06/06/23 1218 06/07/23 0427 06/08/23 0406 06/09/23 0410  WBC 24.4* 17.8* 13.9* 15.8*  NEUTROABS 22.0*  --   --  14.1*  HGB 10.6* 9.5* 9.0* 9.5*  HCT 35.4* 32.5* 30.5* 32.2*  MCV 77.1* 80.4 80.1 80.1  PLT 337 288 290 311   Basic Metabolic Panel: Recent Labs  Lab 06/06/23 1218 06/07/23 0427 06/08/23 0406 06/09/23 0410  NA 137 138 138 140  K 3.7 3.4* 4.0 4.1  CL 107 107 107 110  CO2 20* 21* 19* 18*  GLUCOSE 186* 198* 221* 276*  BUN 22 25* 35* 43*  CREATININE 1.62* 1.49* 1.38* 1.21  CALCIUM 8.6* 8.3* 8.8* 8.9  MG  --  2.4 2.6* 2.5*  PHOS  --  2.6 2.8  --    GFR: Estimated Creatinine Clearance: 71.2 mL/min (by C-G formula based on SCr of 1.21 mg/dL). Liver Function Tests: Recent Labs  Lab 06/06/23 1218  AST 29  ALT 41  ALKPHOS 84  BILITOT 1.2*  PROT 7.5  ALBUMIN 3.2*   No results for input(s): "LIPASE", "AMYLASE" in the last 168 hours. No results for  input(s): "AMMONIA" in the last 168 hours. Coagulation Profile: Recent Labs  Lab 06/06/23 1218  INR 1.3*   Cardiac Enzymes: No results for input(s): "CKTOTAL", "CKMB", "CKMBINDEX", "TROPONINI" in the last 168 hours. BNP (last 3 results) No results for input(s): "PROBNP" in the last 8760 hours. HbA1C: No results for input(s): "HGBA1C" in the last 72 hours. CBG: Recent Labs  Lab 06/07/23 2105 06/08/23 0752 06/08/23 1113 06/08/23 1609 06/08/23 2126  GLUCAP 270* 201* 259* 298* 269*   Lipid Profile: No results for input(s): "CHOL", "HDL", "LDLCALC", "TRIG", "CHOLHDL", "LDLDIRECT" in the last 72 hours. Thyroid Function Tests: No results for input(s): "TSH", "T4TOTAL", "FREET4", "T3FREE", "THYROIDAB" in the last 72 hours. Anemia Panel: No results for input(s): "VITAMINB12", "FOLATE", "FERRITIN", "TIBC", "IRON", "RETICCTPCT" in the last 72 hours. Sepsis Labs: Recent Labs  Lab 06/06/23 1218 06/07/23 0427  PROCALCITON  --  0.86  LATICACIDVEN 1.6  --     Recent Results  (from the past 240 hours)  Culture, blood (Routine x 2)     Status: None (Preliminary result)   Collection Time: 06/06/23 11:58 AM   Specimen: BLOOD RIGHT HAND  Result Value Ref Range Status   Specimen Description   Final    BLOOD RIGHT HAND Performed at [facility], [address], [place], [place] [address]    Special Requests   Final    BOTTLES DRAWN AEROBIC AND ANAEROBIC Blood Culture results may not be optimal due to an inadequate volume of blood received in culture bottles Performed at [facility], [address] [place], [place] [address]    Culture   Final    NO GROWTH 3 DAYS Performed at [facility], [address] [place], [place] [address]    Report Status PENDING  Incomplete  Resp panel by RT-PCR (RSV, Flu A&B, Covid) Peripheral     Status: None   Collection Time: 06/06/23 12:18 PM   Specimen: Peripheral; Nasal Swab  Result Value Ref Range Status   SARS Coronavirus 2 by RT PCR NEGATIVE NEGATIVE Final    Comment: (NOTE) SARS-CoV-2 target nucleic acids are NOT DETECTED.  The SARS-CoV-2 RNA is generally detectable in upper respiratory specimens during the acute phase of infection. The lowest concentration of SARS-CoV-2 viral copies this assay can detect is 138 copies/mL. A negative result does not preclude SARS-Cov-2 infection and should not be used as the sole basis for treatment or other patient management decisions. A negative result may occur with  improper specimen collection/handling, submission of specimen other than nasopharyngeal swab, presence of viral mutation(s) within the areas targeted by this assay, and inadequate number of viral copies(<138 copies/mL). A negative result must be combined with clinical observations, patient history, and epidemiological information. The expected result is Negative.  Fact Sheet for Patients:  BloggerCourse.com  Fact Sheet for Healthcare Providers:   SeriousBroker.it  This test is no t yet approved or cleared by the [place] FDA and  has been authorized for detection and/or diagnosis of SARS-CoV-2 by FDA under an Emergency Use Authorization (EUA). This EUA will remain  in effect (meaning this test can be used) for the duration of the COVID-19 declaration under Section 564(b)(1) of the Act, 21 U.S.C.section 360bbb-3(b)(1), unless the authorization is terminated  or revoked sooner.       Influenza A by PCR NEGATIVE NEGATIVE Final   Influenza B by PCR NEGATIVE NEGATIVE Final    Comment: (NOTE) The Xpert Xpress SARS-CoV-2/FLU/RSV plus assay is intended  as an aid in the diagnosis of influenza from Nasopharyngeal swab specimens and should not be used as a sole basis for treatment. Nasal washings and aspirates are unacceptable for Xpert Xpress SARS-CoV-2/FLU/RSV testing.  Fact Sheet for Patients: BloggerCourse.com  Fact Sheet for Healthcare Providers: SeriousBroker.it  This test is not yet approved or cleared by the [place] FDA and has been authorized for detection and/or diagnosis of SARS-CoV-2 by FDA under an Emergency Use Authorization (EUA). This EUA will remain in effect (meaning this test can be used) for the duration of the COVID-19 declaration under Section 564(b)(1) of the Act, 21 U.S.C. section 360bbb-3(b)(1), unless the authorization is terminated or revoked.     Resp Syncytial Virus by PCR NEGATIVE NEGATIVE Final    Comment: (NOTE) Fact Sheet for Patients: BloggerCourse.com  Fact Sheet for Healthcare Providers: SeriousBroker.it  This test is not yet approved or cleared by the [place] FDA and has been authorized for detection and/or diagnosis of SARS-CoV-2 by FDA under an Emergency Use Authorization (EUA). This EUA will remain in effect (meaning this test can be used) for  the duration of the COVID-19 declaration under Section 564(b)(1) of the Act, 21 U.S.C. section 360bbb-3(b)(1), unless the authorization is terminated or revoked.  Performed at [facility], [address] [place], [place] [address]   Culture, blood (Routine x 2)     Status: None (Preliminary result)   Collection Time: 06/06/23 12:19 PM   Specimen: BLOOD  Result Value Ref Range Status   Specimen Description   Final    BLOOD LEFT ANTECUBITAL Performed at [facility], [address] [place], [place] [address]    Special Requests   Final    BOTTLES DRAWN AEROBIC AND ANAEROBIC Blood Culture adequate volume Performed at [facility], [address] [place], [place] [address]    Culture   Final    NO GROWTH 3 DAYS Performed at [facility], [address], [place], [place] [address]    Report Status PENDING  Incomplete  Respiratory (~20 pathogens) panel by PCR     Status: None   Collection Time: 06/06/23  6:41 PM   Specimen: Nasopharyngeal Swab; Respiratory  Result Value Ref Range Status   Adenovirus NOT DETECTED NOT DETECTED Final   Coronavirus 229E NOT DETECTED NOT DETECTED Final    Comment: (NOTE) The Coronavirus on the Respiratory Panel, DOES NOT test for the novel  Coronavirus (2019 nCoV)    Coronavirus HKU1 NOT DETECTED NOT DETECTED Final   Coronavirus NL63 NOT DETECTED NOT DETECTED Final   Coronavirus OC43 NOT DETECTED NOT DETECTED Final   Metapneumovirus NOT DETECTED NOT DETECTED Final   Rhinovirus / Enterovirus NOT DETECTED NOT DETECTED Final   Influenza A NOT DETECTED NOT DETECTED Final   Influenza B NOT DETECTED NOT DETECTED Final   Parainfluenza Virus 1 NOT DETECTED NOT DETECTED Final   Parainfluenza Virus 2 NOT DETECTED NOT DETECTED Final   Parainfluenza Virus 3 NOT DETECTED NOT DETECTED Final   Parainfluenza Virus 4 NOT DETECTED NOT DETECTED Final   Respiratory Syncytial Virus NOT DETECTED NOT DETECTED Final   Bordetella  pertussis NOT DETECTED NOT DETECTED Final   Bordetella Parapertussis NOT DETECTED NOT DETECTED Final   Chlamydophila pneumoniae NOT DETECTED NOT DETECTED Final   Mycoplasma pneumoniae NOT DETECTED NOT DETECTED Final    Comment: Performed at [facility], [address], [place], [place] [address]  MRSA Next Gen by  PCR, Nasal     Status: None   Collection Time: 06/06/23  6:46 PM   Specimen: Nasal Mucosa; Nasal Swab  Result Value Ref Range Status   MRSA by PCR Next Gen NOT DETECTED NOT DETECTED Final    Comment: (NOTE) The GeneXpert MRSA Assay (FDA approved for NASAL specimens only), is one component of a comprehensive MRSA colonization surveillance program. It is not intended to diagnose MRSA infection nor to guide or monitor treatment for MRSA infections. Test performance is not FDA approved in patients less than 81 years old. Performed at [facility], [address], [place], [place] [address]   Expectorated Sputum Assessment w Gram Stain, Rflx to Resp Cult     Status: None   Collection Time: 06/06/23  7:43 PM   Specimen: Sputum  Result Value Ref Range Status   Specimen Description SPUTUM  Final   Special Requests NONE  Final   Sputum evaluation   Final    THIS SPECIMEN IS ACCEPTABLE FOR SPUTUM CULTURE Performed at [facility], [address], [place], [place] [address]    Report Status 06/07/2023 FINAL  Final  Culture, Respiratory w Gram Stain     Status: None (Preliminary result)   Collection Time: 06/06/23  7:43 PM   Specimen: SPU  Result Value Ref Range Status   Specimen Description   Final    SPUTUM Performed at [facility], [address], [place], [place] [address]    Special Requests   Final    NONE Reflexed from [id number] Performed at [facility], [address], [place], [place] [address]    Gram Stain   Final    NO WBC SEEN FEW GRAM POSITIVE COCCI IN PAIRS FEW GRAM NEGATIVE  RODS    Culture   Final    CULTURE REINCUBATED FOR BETTER GROWTH Performed at [facility], [address], [place], [place] [address]    Report Status PENDING  Incomplete         Radiology Studies: No results found.       Scheduled Meds:  amiodarone  200 mg Oral BID   atorvastatin  40 mg Oral QPM   budesonide  2 mL Nebulization BID   carvedilol  12.5 mg Oral BID WC   clopidogrel  75 mg Oral Daily   dextromethorphan-guaiFENesin  1 tablet Oral BID   donepezil  10 mg Oral QHS   furosemide  80 mg Intravenous Daily   insulin aspart  0-15 Units Subcutaneous TID WC   insulin aspart  0-5 Units Subcutaneous QHS   insulin aspart  6 Units Subcutaneous TID WC   insulin glargine-yfgn  40 Units Subcutaneous BID   ipratropium-albuterol  3 mL Nebulization TID   isosorbide mononitrate  30 mg Oral Daily   latanoprost  1 drop Both Eyes QHS   memantine  5 mg Oral BID   mirabegron ER  50 mg Oral Daily   mometasone-formoterol  2 puff Inhalation BID   montelukast  10 mg Oral QHS   oxybutynin  10 mg Oral Daily   pantoprazole  40 mg Oral Daily   predniSONE  40 mg Oral Q breakfast   rivaroxaban  20 mg Oral Q supper   sertraline  50 mg Oral BH-q7a   spironolactone  25 mg Oral Daily   Continuous Infusions:  ceFEPime (MAXIPIME) IV 2 g (06/09/23 0257)          Glade Lloyd, MD [facility] 06/09/2023, 8:32 AM

## 2023-06-09 NOTE — Plan of Care (Signed)
  Problem: Education: Goal: Knowledge of General Education information will improve Description: Including pain rating scale, medication(s)/side effects and non-pharmacologic comfort measures Outcome: Progressing   Problem: Health Behavior/Discharge Planning: Goal: Ability to manage health-related needs will improve Outcome: Progressing   Problem: Activity: Goal: Risk for activity intolerance will decrease Outcome: Progressing   Problem: Nutrition: Goal: Adequate nutrition will be maintained Outcome: Progressing   Problem: Coping: Goal: Level of anxiety will decrease Outcome: Progressing   Problem: Elimination: Goal: Will not experience complications related to urinary retention Outcome: Progressing   Problem: Pain Management: Goal: General experience of comfort will improve Outcome: Progressing

## 2023-06-10 DIAGNOSIS — J189 Pneumonia, unspecified organism: Secondary | ICD-10-CM | POA: Diagnosis not present

## 2023-06-10 DIAGNOSIS — A419 Sepsis, unspecified organism: Secondary | ICD-10-CM | POA: Diagnosis not present

## 2023-06-10 LAB — MAGNESIUM: Magnesium: 2.6 mg/dL — ABNORMAL HIGH (ref 1.7–2.4)

## 2023-06-10 LAB — CBC WITH DIFFERENTIAL/PLATELET
Abs Immature Granulocytes: 0.12 10*3/uL — ABNORMAL HIGH (ref 0.00–0.07)
Basophils Absolute: 0.1 10*3/uL (ref 0.0–0.1)
Basophils Relative: 0 %
Eosinophils Absolute: 0 10*3/uL (ref 0.0–0.5)
Eosinophils Relative: 0 %
HCT: 36.8 % — ABNORMAL LOW (ref 39.0–52.0)
Hemoglobin: 10.7 g/dL — ABNORMAL LOW (ref 13.0–17.0)
Immature Granulocytes: 1 %
Lymphocytes Relative: 4 %
Lymphs Abs: 0.6 10*3/uL — ABNORMAL LOW (ref 0.7–4.0)
MCH: 23.2 pg — ABNORMAL LOW (ref 26.0–34.0)
MCHC: 29.1 g/dL — ABNORMAL LOW (ref 30.0–36.0)
MCV: 79.8 fL — ABNORMAL LOW (ref 80.0–100.0)
Monocytes Absolute: 0.5 10*3/uL (ref 0.1–1.0)
Monocytes Relative: 4 %
Neutro Abs: 13.5 10*3/uL — ABNORMAL HIGH (ref 1.7–7.7)
Neutrophils Relative %: 91 %
Platelets: 358 10*3/uL (ref 150–400)
RBC: 4.61 MIL/uL (ref 4.22–5.81)
RDW: 16.4 % — ABNORMAL HIGH (ref 11.5–15.5)
WBC: 14.8 10*3/uL — ABNORMAL HIGH (ref 4.0–10.5)
nRBC: 0 % (ref 0.0–0.2)

## 2023-06-10 LAB — GLUCOSE, CAPILLARY
Glucose-Capillary: 257 mg/dL — ABNORMAL HIGH (ref 70–99)
Glucose-Capillary: 239 mg/dL — ABNORMAL HIGH (ref 70–99)

## 2023-06-10 LAB — BASIC METABOLIC PANEL
Anion gap: 13 (ref 5–15)
BUN: 58 mg/dL — ABNORMAL HIGH (ref 8–23)
CO2: 16 mmol/L — ABNORMAL LOW (ref 22–32)
Calcium: 9 mg/dL (ref 8.9–10.3)
Chloride: 108 mmol/L (ref 98–111)
Creatinine, Ser: 1.82 mg/dL — ABNORMAL HIGH (ref 0.61–1.24)
GFR, Estimated: 38 mL/min — ABNORMAL LOW (ref >60)
Glucose, Bld: 299 mg/dL — ABNORMAL HIGH (ref 70–99)
Potassium: 3.9 mmol/L (ref 3.5–5.1)
Sodium: 137 mmol/L (ref 135–145)

## 2023-06-10 MED ORDER — PREDNISONE 20 MG PO TABS: 40.0000 mg | ORAL_TABLET | Freq: Every day | ORAL | 0 refills | Status: AC

## 2023-06-10 MED ORDER — TORSEMIDE 20 MG PO TABS: 40.0000 mg | ORAL_TABLET | Freq: Two times a day (BID) | ORAL | Status: DC

## 2023-06-10 MED ORDER — SODIUM BICARBONATE 650 MG PO TABS: 650.0000 mg | ORAL_TABLET | Freq: Three times a day (TID) | ORAL | 0 refills | Status: DC

## 2023-06-10 MED ORDER — CEFUROXIME AXETIL 500 MG PO TABS: 500.0000 mg | ORAL_TABLET | Freq: Two times a day (BID) | ORAL | 0 refills | Status: DC

## 2023-06-10 MED ORDER — DM-GUAIFENESIN ER 30-600 MG PO TB12: 1.0000 | ORAL_TABLET | Freq: Two times a day (BID) | ORAL | 0 refills | Status: DC

## 2023-06-10 MED ORDER — IPRATROPIUM-ALBUTEROL 0.5-2.5 (3) MG/3ML IN SOLN
3.0000 mL | Freq: Two times a day (BID) | RESPIRATORY_TRACT | Status: DC
  Administered 2023-06-10: 3 mL via RESPIRATORY_TRACT
  Filled 2023-06-10: qty 3

## 2023-06-10 MED ORDER — SODIUM BICARBONATE 650 MG PO TABS
650.0000 mg | ORAL_TABLET | Freq: Three times a day (TID) | ORAL | Status: DC
  Administered 2023-06-10: 650 mg via ORAL
  Filled 2023-06-10: qty 1

## 2023-06-10 MED ORDER — CEFUROXIME AXETIL 500 MG PO TABS: 500.0000 mg | ORAL_TABLET | Freq: Two times a day (BID) | ORAL | 0 refills | Status: AC

## 2023-06-10 MED ORDER — PREDNISONE 20 MG PO TABS: 40.0000 mg | ORAL_TABLET | Freq: Every day | ORAL | 0 refills | Status: DC

## 2023-06-10 NOTE — Progress Notes (Addendum)
Patient Name: Mike Hunter Date of Encounter: 06/10/2023 [facility] Cardiologist: None   Interval Summary  .    He is feeling much better.  He is eager to go home.  He is able to lie completely supine in bed without any orthopnea.  Does not have lower extremity edema. Diuresis since admission, more than 5 L negative, including 1.6 L negative in last 24 hours. I am not sure if today's weight is truly accurate but if it is he has supposedly lost 40 pounds since admission and 10 pounds since yesterday. Significant uptick in creatinine today.  Vital Signs .    Vitals:   06/09/23 2120 06/10/23 0414 06/10/23 0456 06/10/23 0808  BP: (!) 106/59 139/78    Pulse: 61 (!) 58    Resp: 18 19    Temp: 97.9 F (36.6 C) 97.7 F (36.5 C)    TempSrc: Oral Oral    SpO2: 99% 99%  99%  Weight:   117.7 kg   Height:        Intake/Output Summary (Last 24 hours) at 06/10/2023 0838 Last data filed at 06/10/2023 0351 Gross per 24 hour  Intake 2091.94 ml  Output 3200 ml  Net -1108.06 ml      06/10/2023    4:56 AM 06/09/2023    5:00 AM 06/08/2023    5:00 AM  Last 3 Weights  Weight (lbs) 259 lb 7.7 oz 269 lb 2.9 oz 269 lb 2.9 oz  Weight (kg) 117.7 kg 122.1 kg 122.1 kg      Telemetry/ECG    Sinus rhythm- Personally Reviewed  Physical Exam .   GEN: No acute distress.   Neck: No JVD Cardiac: RRR, normal S1 and distinct S2, 2/6 aortic ejection murmur remains early peaking, no diastolic murmurs, rubs, or gallops.  Respiratory: Clear to auscultation bilaterally. GI: Soft, nontender, non-distended  MS: No edema  Assessment & Plan .     He appears to be euvolemic clinically and his renal function changes suggest we have reached the limits of acute diuresis. His aortic stenosis has worsened, but remains moderate, unlikely to be contributing in a significant fashion to the heart failure exacerbation.  This fits with the results of his recent dobutamine echocardiogram performed at  [facility]. The echocardiogram report describes his LV filling as being "restrictive, but I do not think this is accurate with a deceleration time of 329 ms (better described as pseudonormal despite the markedly increased E/A ratio; the reason for the increased E/A ratio is extremely weak atrial contraction, note the very low a' velocities.  This suggests atrial myopathy or recent episode of atrial fibrillation with mechanical stunning). Switch to oral torsemide (he received his dose of intravenous furosemide early this morning at 0830 hrs, start torsemide tomorrow). Reinforced the importance of dietary sodium restriction and daily weight monitoring.  Call us if he gains more than 3 pounds in 24 hours or 5 pounds in a week.  [facility] will sign off.   Medication Recommendations:   Torsemide 40 mg twice daily Amiodarone 200 mg twice daily Atorvastatin 40 mg daily Carvedilol 12.5 mg twice daily Clopidogrel any 5 mg daily As a Sorbide mononitrate 30 mg daily Spironolactone 25 mg daily Xarelto 20 mg daily  Note that he was also taking Farxiga 10 mg once daily according to Dr. Garner Nash outpatient cardiology note from 06/03/2023.  This was not reported on his medication reconciliation at the time of this admission, but if he was  indeed taking it this should be restarted.  Other recommendations (labs, testing, etc): Repeat basic metabolic panel early next week Follow up as an outpatient: Follow-up with his cardiology providers at [facility] (Dr. Flonnie Hailstone and Dr. Reuel Boom)  For questions or updates, please contact [facility] Please consult [email or web address] for contact info under        Signed, Thurmon Fair, MD

## 2023-06-10 NOTE — Plan of Care (Signed)

## 2023-06-10 NOTE — Progress Notes (Addendum)
Discharge instructions reviewed with patient and spouse. All questions answered. All belongings accounted for. Patient to follow up with MD in  *1 weeks. Patient medications sent to [facility] in [place] [address]. PIV removed. Assisted via WC to private vehicle.

## 2023-06-10 NOTE — Discharge Summary (Signed)
Physician Discharge Summary  Mike Hunter ZOX:[id number] DOB: [birth year] DOA: 06/06/2023  PCP: Lenox Ponds, MD  Admit date: 06/06/2023 Discharge date: 06/10/2023  Admitted From: Home Disposition: Home  Recommendations for Outpatient Follow-up:  Follow up with PCP in 1 week with repeat CBC/BMP Outpatient follow-up with cardiology at [facility] Follow up in ED if symptoms worsen or new appear   Home Health: No Equipment/Devices: None  Discharge Condition: Stable CODE STATUS: Full Diet recommendation: Heart healthy/fluid restriction of up to 1500 cc a day/carb modified  Brief/Interim Summary: 72 yrs old male with PMH significant for moderate persistent asthma, DM II, class II obesity, HLD, HTN, GERD, COPD on 1 L [place] at night, OSA on CPAP, AAA, left carotid stenosis s/p stent placement on 12/19, CKD 3A, paroxysmal A-fib on Xarelto, BPH, urge incontinence, bipolar disorder, anxiety and depression, episodic mood disorder, CAD, non-rheumatic aortic valve stenosis, osteoarthritis and chronic diastolic presented with worsening shortness of breath.  On presentation, chest x-ray showed opacity within the right midlung concerning for pneumonia.  He was started on IV antibiotics.  Cardiology was consulted.  He was also treated with IV Lasix and has diuresed well.  Echo showed EF of 60 to 65% with grade 3 diastolic dysfunction.  His condition has improved.  He wants to go home today.  Cardiology has cleared the patient for discharge.  He will be discharged home on few more days of oral antibiotics and steroids.  Outpatient follow-up with PCP and his regular cardiologist.  Discharge Diagnoses:   Sepsis: Present on admission Healthcare associated pneumonia -Currently on 2 L oxygen by nasal cannula.  Hemodynamically stable.  Blood cultures negative so far.  Currently on cefepime.  Respiratory status remained stable.  Wants to go home today.  Discharge patient home today on 3 more days of  oral Ceftin.   Acute on chronic diastolic heart failure Moderate aortic stenosis -Stress echo on 05/27/2023 had showed EF of 55 to 60% with moderate  aortic stenosis -Treated with Lasix IV.  Continue Coreg, spironolactone, Imdur.  Negative fluid balance of 5319.9 cc since admission. -Cardiology has switched him to oral torsemide and has cleared him for discharge on torsemide 40 mg twice a day, Coreg 12.5 mg twice a day, Imdur 30 mg daily, spironolactone 25 mg daily.  Outpatient follow-up with his regular cardiologist at [facility].  COPD exacerbation Chronic respiratory failure with hypoxia -Continue home regimen.  Treated with oral prednisone.  Continue oral prednisone for 3 more days on discharge.    Diabetes mellitus type 2 with hyperglycemia -Recent A1c 7.9.  Continue home regimen.  Carb modified diet   Paroxysmal A-fib -Currently rate controlled.  Continue Coreg, Xarelto and amiodarone   Left carotid stenosis -Had stenting of his left carotic artery stenosis on 12/19 at [facility]. Continue Plavix and Xarelto -Follow-up with vascular surgery in the outpatient   CAD with angina pectoris AAA -Patient had a positive dobutamine stress echo on 12/11. He reports intermittent chest pain. No chest pain at the moment.  -Troponins are flat. EKG without any ischemic changes. Lipid panel 3 months ago showed LDL of 40. -Continue Plavix, Imdur, atorvastatin and Xarelto -Follow-up with cardiology in the outpatient   CKD stage IIIa -Baseline creatinine of 1.4-1.6.  Creatinine bumped up slightly.  Outpatient follow-up.  Acute metabolic acidosis -Start oral sodium bicarbonate tablets and discharge patient home on the same.  Outpatient follow-up of BMP within a week with his PCP.   Hypokalemia -Improved  Leukocytosis -Monitor.   Anemia of chronic disease -From chronic illnesses.  Hemoglobin stable.  Monitor intermittently   BPH Urge incontinence -Continue Myrbetriq and  oxybutynin.  Outpatient follow-up with urology   OSA -Continue CPAP at bedtime   Cognitive deficits Mild dementia -Continue memantine and donepezil.  Outpatient follow-up with PCP/neurology   Anxiety and depression -Continue sertraline   GERD Continue Protonix   Class II obesity -Outpatient follow-up  Discharge Instructions  Discharge Instructions     Diet - low sodium heart healthy   Complete by: As directed    Diet Carb Modified   Complete by: As directed    Increase activity slowly   Complete by: As directed       Allergies as of 06/10/2023       Reactions   Buprenorphine Hcl Nausea And Vomiting   Other reaction(s): Nausea And Vomiting   Hydromorphone Other (See Comments)   Other reaction(s): Mental Status Changes (intolerance) Other reaction(s): HALLUCINATIONS hallucinations hallucinations hallucinations hallucinations   Codeine Nausea And Vomiting   Mirtazapine    Other reaction(s): Other (See Comments) Nightmares & altered mental status   Morphine And Codeine Nausea And Vomiting        Medication List     TAKE these medications    albuterol 108 (90 Base) MCG/ACT inhaler Commonly known as: VENTOLIN HFA Inhale 2 puffs into the lungs every 4 (four) hours as needed for wheezing.   amiodarone 200 MG tablet Commonly known as: PACERONE Take 200 mg by mouth 2 (two) times daily.   atorvastatin 40 MG tablet Commonly known as: LIPITOR Take 40 mg by mouth every evening.   budesonide 0.5 MG/2ML nebulizer solution Commonly known as: PULMICORT Take 2 mLs by nebulization 2 (two) times daily.   budesonide-formoterol 160-4.5 MCG/ACT inhaler Commonly known as: SYMBICORT Inhale 2 puffs into the lungs 2 (two) times daily.   carvedilol 6.25 MG tablet Commonly known as: COREG Take 12.5 mg by mouth 2 (two) times daily with a meal.   cefUROXime 500 MG tablet Commonly known as: CEFTIN Take 1 tablet (500 mg total) by mouth 2 (two) times daily for 3  days. Start taking on: June 11, 2023   clopidogrel 75 MG tablet Commonly known as: PLAVIX Take 1 tablet by mouth daily.   dextromethorphan-guaiFENesin 30-600 MG 12hr tablet Commonly known as: MUCINEX DM Take 1 tablet by mouth 2 (two) times daily.   donepezil 10 MG tablet Commonly known as: ARICEPT Take 10 mg by mouth at bedtime.   eszopiclone 2 MG Tabs tablet Commonly known as: LUNESTA Take 2 mg by mouth at bedtime as needed.   isosorbide mononitrate 30 MG 24 hr tablet Commonly known as: IMDUR Take 1 tablet by mouth daily.   memantine 5 MG tablet Commonly known as: NAMENDA Take 5 mg by mouth 2 (two) times daily.   mirabegron ER 50 MG Tb24 tablet Commonly known as: MYRBETRIQ Take 1 tablet by mouth daily.   montelukast 10 MG tablet Commonly known as: SINGULAIR Take 10 mg by mouth at bedtime.   NovoLOG FlexPen 100 UNIT/ML injection Generic drug: insulin aspart Inject 26-44 Units into the skin See admin instructions. 5 units prior to meals for cbg>= 150  Sliding scale (base is 26u in the AM, 30u at noon, and  36-44 at night   omeprazole 40 MG capsule Commonly known as: PRILOSEC Take 1 capsule by mouth at bedtime.   oxybutynin 10 MG 24 hr tablet Commonly known as: DITROPAN-XL Take 10 mg by  mouth daily.   predniSONE 20 MG tablet Commonly known as: DELTASONE Take 2 tablets (40 mg total) by mouth daily with breakfast for 3 days. Start taking on: June 11, 2023   sertraline 50 MG tablet Commonly known as: ZOLOFT Take 50 mg by mouth every morning.   sodium bicarbonate 650 MG tablet Take 1 tablet (650 mg total) by mouth 3 (three) times daily.   spironolactone 25 MG tablet Commonly known as: ALDACTONE Take 25 mg by mouth daily.   torsemide 20 MG tablet Commonly known as: DEMADEX Take 40 mg by mouth 2 (two) times daily.   TRESIBA FLEXTOUCH [place] Inject 40-50 Units into the skin See admin instructions. 40 morning, 50 night   Wixela Inhub 250-50 MCG/ACT  Aepb Generic drug: fluticasone-salmeterol Inhale 1 puff into the lungs in the morning and at bedtime.   Xalatan 0.005 % ophthalmic solution Generic drug: latanoprost Place 1 drop into both eyes at bedtime.   Xarelto 20 MG Tabs tablet Generic drug: rivaroxaban Take 20 mg by mouth daily with supper.        Follow-up Information     Randel Pigg, Dorma Russell, MD. Schedule an appointment as soon as possible for a visit in 1 week(s).   Specialty: Family Medicine Contact information: [phone] [address] [place] [address] [phone]                Allergies  Allergen Reactions   Buprenorphine Hcl Nausea And Vomiting    Other reaction(s): Nausea And Vomiting    Hydromorphone Other (See Comments)    Other reaction(s): Mental Status Changes (intolerance) Other reaction(s): HALLUCINATIONS hallucinations hallucinations hallucinations hallucinations    Codeine Nausea And Vomiting   Mirtazapine     Other reaction(s): Other (See Comments) Nightmares & altered mental status    Morphine And Codeine Nausea And Vomiting    Consultations: Cardiology   Procedures/Studies: ECHOCARDIOGRAM COMPLETE Result Date: 06/09/2023    ECHOCARDIOGRAM REPORT   Patient Name:   Hillis C Guilmette Date of Exam: 06/09/2023 Medical Rec #:  [id number]     Height:       71.0 in Accession #:    [id number]    Weight:       269.2 lb Date of Birth:  Apr 05, 1951      BSA:          2.392 m Patient Age:    72 years      BP:           133/77 mmHg Patient Gender: M             HR:           63 bpm. Exam Location:  Inpatient Procedure: 2D Echo, Cardiac Doppler, Color Doppler and Intracardiac            Opacification Agent Indications:    CHF I50.9  History:        Patient has prior history of Echocardiogram examinations, most                 recent 09/26/2018. Aortic Valve Disease.  Sonographer:    Harriette Bouillon RDCS Referring Phys: [phone] MARY E BRANCH IMPRESSIONS  1. Left ventricular ejection fraction, by  estimation, is 60 to 65%. The left ventricle has normal function. The left ventricle has no regional wall motion abnormalities. Left ventricular diastolic parameters are consistent with Grade III diastolic dysfunction (restrictive).  2. Right ventricular systolic function is normal. The right ventricular size is moderately enlarged.  3. Left atrial size was mildly dilated.  4. A small pericardial effusion is present. The pericardial effusion is posterior to the left ventricle.  5. The mitral valve is normal in structure. Trivial mitral valve regurgitation. No evidence of mitral stenosis.  6. Normal stroke volume index with DVI .032. The aortic valve is calcified. There is moderate calcification of the aortic valve. Aortic valve regurgitation is not visualized. Mild to moderate aortic valve stenosis. Aortic valve area, by VTI measures 1.34 cm. Aortic valve mean gradient measures 22.0 mmHg. Aortic valve Vmax measures 2.92 m/s.  7. The inferior vena cava is dilated in size with >50% respiratory variability, suggesting right atrial pressure of 8 mmHg. Comparison(s): Prior images reviewed side by side. Aortic valve gradients have increased. FINDINGS  Left Ventricle: Left ventricular ejection fraction, by estimation, is 60 to 65%. The left ventricle has normal function. The left ventricle has no regional wall motion abnormalities. The left ventricular internal cavity size was normal in size. There is  no left ventricular hypertrophy. Left ventricular diastolic parameters are consistent with Grade III diastolic dysfunction (restrictive). Right Ventricle: The right ventricular size is moderately enlarged. No increase in right ventricular wall thickness. Right ventricular systolic function is normal. Left [facility]: Left atrial size was mildly dilated. Right [facility]: Right atrial size was normal in size. Pericardium: A small pericardial effusion is present. The pericardial effusion is posterior to the left ventricle. Mitral  Valve: The mitral valve is normal in structure. Trivial mitral valve regurgitation. No evidence of mitral valve stenosis. Tricuspid Valve: The tricuspid valve is normal in structure. Tricuspid valve regurgitation is mild. Aortic Valve: Normal stroke volume index with DVI .032. The aortic valve is calcified. There is moderate calcification of the aortic valve. Aortic valve regurgitation is not visualized. Mild to moderate aortic stenosis is present. Aortic valve mean gradient measures 22.0 mmHg. Aortic valve peak gradient measures 34.1 mmHg. Aortic valve area, by VTI measures 1.34 cm. Pulmonic Valve: The pulmonic valve was normal in structure. Pulmonic valve regurgitation is trivial. No evidence of pulmonic stenosis. Aorta: The aortic root and ascending aorta are structurally normal, with no evidence of dilitation. Venous: The inferior vena cava is dilated in size with greater than 50% respiratory variability, suggesting right atrial pressure of 8 mmHg. IAS/Shunts: No atrial level shunt detected by color flow Doppler.  LEFT VENTRICLE PLAX 2D LVIDd:         5.70 cm   Diastology LVIDs:         4.20 cm   LV e' medial:    5.98 cm/s LV PW:         1.00 cm   LV E/e' medial:  17.2 LV IVS:        1.00 cm   LV e' lateral:   12.10 cm/s LVOT diam:     2.30 cm   LV E/e' lateral: 8.5 LV SV:         90 LV SV Index:   38 LVOT Area:     4.15 cm  RIGHT VENTRICLE             IVC RV S prime:     16.60 cm/s  IVC diam: 2.40 cm TAPSE (M-mode): 2.5 cm LEFT ATRIUM             Index        RIGHT ATRIUM           Index LA diam:        5.20 cm 2.17 cm/m  RA Area:     19.70 cm LA Vol (A2C):   64.8 ml 27.09 ml/m  RA Volume:   57.70 ml  24.12 ml/m LA Vol (A4C):   92.0 ml 38.46 ml/m LA Biplane Vol: 77.4 ml 32.35 ml/m  AORTIC VALVE AV Area (Vmax):    1.48 cm AV Area (Vmean):   1.30 cm AV Area (VTI):     1.34 cm AV Vmax:           292.00 cm/s AV Vmean:          212.200 cm/s AV VTI:            0.670 m AV Peak Grad:      34.1 mmHg AV Mean  Grad:      22.0 mmHg LVOT Vmax:         104.00 cm/s LVOT Vmean:        66.600 cm/s LVOT VTI:          0.216 m LVOT/AV VTI ratio: 0.32  AORTA Ao Root diam: 3.00 cm Ao Asc diam:  3.40 cm MITRAL VALVE MV Area (PHT): 2.31 cm     SHUNTS MV Decel Time: 329 msec     Systemic VTI:  0.22 m MV E velocity: 103.00 cm/s  Systemic Diam: 2.30 cm MV A velocity: 43.50 cm/s MV E/A ratio:  2.37 Riley Lam MD Electronically signed by Riley Lam MD Signature Date/Time: 06/09/2023/1:47:56 PM    Final    DG Chest 2 View Result Date: 06/06/2023 CLINICAL DATA:  Shortness of breath, chest pain and cough. EXAM: CHEST - 2 VIEW COMPARISON:  03/02/2023 FINDINGS: Stable cardiomediastinal contours. Small right pleural effusion. Asymmetric opacification within the right mid lung in the perihilar distribution is identified. Visualized osseous structures are intact. Signs of previous ACDF within the lower cervical spine. IMPRESSION: 1. Asymmetric opacification within the right mid lung in the perihilar distribution. Differential considerations include pneumonia versus asymmetric pulmonary edema. 2. Small right pleural effusion. Electronically Signed   By: Signa Kell M.D.   On: 06/06/2023 15:45      Subjective: Patient seen and examined at bedside.  Feels much better.  Ambulating better.  Cough is improving.  Swelling is improving.  Wants to go home today.  No fever or vomiting reported.  Discharge Exam: Vitals:   06/10/23 0414 06/10/23 0808  BP: 139/78   Pulse: (!) 58   Resp: 19   Temp: 97.7 F (36.5 C)   SpO2: 99% 99%    General: Pt is alert, awake, not in acute distress.  On 2 L oxygen by nasal cannula.  Looks chronically ill and deconditioned. Cardiovascular: Mild intermittent bradycardia present, S1/S2 + Respiratory: bilateral decreased breath sounds at bases with scattered crackles Abdominal: Soft, obese, NT, ND, bowel sounds + Extremities: Trace lower extremity edema; no cyanosis    The  results of significant diagnostics from this hospitalization (including imaging, microbiology, ancillary and laboratory) are listed below for reference.     Microbiology: Recent Results (from the past 240 hours)  Culture, blood (Routine x 2)     Status: None (Preliminary result)   Collection Time: 06/06/23 11:58 AM   Specimen: BLOOD RIGHT HAND  Result Value Ref Range Status   Specimen Description   Final    BLOOD RIGHT HAND Performed at [facility], [address], [place], [place] [address]    Special Requests   Final    BOTTLES DRAWN AEROBIC AND ANAEROBIC Blood Culture results may not be optimal  due to an inadequate volume of blood received in culture bottles Performed at [facility], [address] [place], [place] [address]    Culture   Final    NO GROWTH 4 DAYS Performed at [facility], [address] [place] [address] [place], [place] [address]    Report Status PENDING  Incomplete  Resp panel by RT-PCR (RSV, Flu A&B, Covid) Peripheral     Status: None   Collection Time: 06/06/23 12:18 PM   Specimen: Peripheral; Nasal Swab  Result Value Ref Range Status   SARS Coronavirus 2 by RT PCR NEGATIVE NEGATIVE Final    Comment: (NOTE) SARS-CoV-2 target nucleic acids are NOT DETECTED.  The SARS-CoV-2 RNA is generally detectable in upper respiratory specimens during the acute phase of infection. The lowest concentration of SARS-CoV-2 viral copies this assay can detect is 138 copies/mL. A negative result does not preclude SARS-Cov-2 infection and should not be used as the sole basis for treatment or other patient management decisions. A negative result may occur with  improper specimen collection/handling, submission of specimen other than nasopharyngeal swab, presence of viral mutation(s) within the areas targeted by this assay, and inadequate number of viral copies(<138 copies/mL). A negative result must be combined with clinical observations, patient history, and  epidemiological information. The expected result is Negative.  Fact Sheet for Patients:  BloggerCourse.com  Fact Sheet for Healthcare Providers:  SeriousBroker.it  This test is no t yet approved or cleared by the [place] FDA and  has been authorized for detection and/or diagnosis of SARS-CoV-2 by FDA under an Emergency Use Authorization (EUA). This EUA will remain  in effect (meaning this test can be used) for the duration of the COVID-19 declaration under Section 564(b)(1) of the Act, 21 U.S.C.section 360bbb-3(b)(1), unless the authorization is terminated  or revoked sooner.       Influenza A by PCR NEGATIVE NEGATIVE Final   Influenza B by PCR NEGATIVE NEGATIVE Final    Comment: (NOTE) The Xpert Xpress SARS-CoV-2/FLU/RSV plus assay is intended as an aid in the diagnosis of influenza from Nasopharyngeal swab specimens and should not be used as a sole basis for treatment. Nasal washings and aspirates are unacceptable for Xpert Xpress SARS-CoV-2/FLU/RSV testing.  Fact Sheet for Patients: BloggerCourse.com  Fact Sheet for Healthcare Providers: SeriousBroker.it  This test is not yet approved or cleared by the [place] FDA and has been authorized for detection and/or diagnosis of SARS-CoV-2 by FDA under an Emergency Use Authorization (EUA). This EUA will remain in effect (meaning this test can be used) for the duration of the COVID-19 declaration under Section 564(b)(1) of the Act, 21 U.S.C. section 360bbb-3(b)(1), unless the authorization is terminated or revoked.     Resp Syncytial Virus by PCR NEGATIVE NEGATIVE Final    Comment: (NOTE) Fact Sheet for Patients: BloggerCourse.com  Fact Sheet for Healthcare Providers: SeriousBroker.it  This test is not yet approved or cleared by the [place] FDA and has been  authorized for detection and/or diagnosis of SARS-CoV-2 by FDA under an Emergency Use Authorization (EUA). This EUA will remain in effect (meaning this test can be used) for the duration of the COVID-19 declaration under Section 564(b)(1) of the Act, 21 U.S.C. section 360bbb-3(b)(1), unless the authorization is terminated or revoked.  Performed at [facility], [address] [place], [place] [address]   Culture, blood (Routine x 2)     Status: None (Preliminary result)   Collection Time: 06/06/23 12:19 PM  Specimen: BLOOD  Result Value Ref Range Status   Specimen Description   Final    BLOOD LEFT ANTECUBITAL Performed at [facility], [address] [place], [place] [address]    Special Requests   Final    BOTTLES DRAWN AEROBIC AND ANAEROBIC Blood Culture adequate volume Performed at [facility], [address] [place], [place] [address]    Culture   Final    NO GROWTH 4 DAYS Performed at [facility], [address], [place], [place] [address]    Report Status PENDING  Incomplete  Respiratory (~20 pathogens) panel by PCR     Status: None   Collection Time: 06/06/23  6:41 PM   Specimen: Nasopharyngeal Swab; Respiratory  Result Value Ref Range Status   Adenovirus NOT DETECTED NOT DETECTED Final   Coronavirus 229E NOT DETECTED NOT DETECTED Final    Comment: (NOTE) The Coronavirus on the Respiratory Panel, DOES NOT test for the novel  Coronavirus (2019 nCoV)    Coronavirus HKU1 NOT DETECTED NOT DETECTED Final   Coronavirus NL63 NOT DETECTED NOT DETECTED Final   Coronavirus OC43 NOT DETECTED NOT DETECTED Final   Metapneumovirus NOT DETECTED NOT DETECTED Final   Rhinovirus / Enterovirus NOT DETECTED NOT DETECTED Final   Influenza A NOT DETECTED NOT DETECTED Final   Influenza B NOT DETECTED NOT DETECTED Final   Parainfluenza Virus 1 NOT DETECTED NOT DETECTED Final   Parainfluenza Virus 2 NOT DETECTED NOT DETECTED Final    Parainfluenza Virus 3 NOT DETECTED NOT DETECTED Final   Parainfluenza Virus 4 NOT DETECTED NOT DETECTED Final   Respiratory Syncytial Virus NOT DETECTED NOT DETECTED Final   Bordetella pertussis NOT DETECTED NOT DETECTED Final   Bordetella Parapertussis NOT DETECTED NOT DETECTED Final   Chlamydophila pneumoniae NOT DETECTED NOT DETECTED Final   Mycoplasma pneumoniae NOT DETECTED NOT DETECTED Final    Comment: Performed at [facility], [address] [place], [place] [address]  MRSA Next Gen by PCR, Nasal     Status: None   Collection Time: 06/06/23  6:46 PM   Specimen: Nasal Mucosa; Nasal Swab  Result Value Ref Range Status   MRSA by PCR Next Gen NOT DETECTED NOT DETECTED Final    Comment: (NOTE) The GeneXpert MRSA Assay (FDA approved for NASAL specimens only), is one component of a comprehensive MRSA colonization surveillance program. It is not intended to diagnose MRSA infection nor to guide or monitor treatment for MRSA infections. Test performance is not FDA approved in patients less than 74 years old. Performed at [facility], [address], [place], [place] [address]   Expectorated Sputum Assessment w Gram Stain, Rflx to Resp Cult     Status: None   Collection Time: 06/06/23  7:43 PM   Specimen: Sputum  Result Value Ref Range Status   Specimen Description SPUTUM  Final   Special Requests NONE  Final   Sputum evaluation   Final    THIS SPECIMEN IS ACCEPTABLE FOR SPUTUM CULTURE Performed at [facility], [address], [place], [place] [address]    Report Status 06/07/2023 FINAL  Final  Culture, Respiratory w Gram Stain     Status: None   Collection Time: 06/06/23  7:43 PM   Specimen: SPU  Result Value Ref Range Status   Specimen Description   Final    SPUTUM Performed at [facility], [address] [place], [place] [address]  Special Requests   Final    NONE Reflexed from [id number] Performed at [facility], [address], [place], [place] [address]    Gram Stain   Final    NO WBC SEEN FEW GRAM POSITIVE COCCI IN PAIRS FEW GRAM NEGATIVE RODS    Culture   Final    MODERATE Normal respiratory flora-no Staph aureus or Pseudomonas seen Performed at [facility], [address] [place], [place] [address]    Report Status 06/09/2023 FINAL  Final     Labs: BNP (last 3 results) Recent Labs    06/06/23 1218  BNP 485.2*   Basic Metabolic Panel: Recent Labs  Lab 06/06/23 1218 06/07/23 0427 06/08/23 0406 06/09/23 0410 06/10/23 0349  NA 137 138 138 140 137  K 3.7 3.4* 4.0 4.1 3.9  CL 107 107 107 110 108  CO2 20* 21* 19* 18* 16*  GLUCOSE 186* 198* 221* 276* 299*  BUN 22 25* 35* 43* 58*  CREATININE 1.62* 1.49* 1.38* 1.21 1.82*  CALCIUM 8.6* 8.3* 8.8* 8.9 9.0  MG  --  2.4 2.6* 2.5* 2.6*  PHOS  --  2.6 2.8  --   --    Liver Function Tests: Recent Labs  Lab 06/06/23 1218  AST 29  ALT 41  ALKPHOS 84  BILITOT 1.2*  PROT 7.5  ALBUMIN 3.2*   No results for input(s): "LIPASE", "AMYLASE" in the last 168 hours. No results for input(s): "AMMONIA" in the last 168 hours. CBC: Recent Labs  Lab 06/06/23 1218 06/07/23 0427 06/08/23 0406 06/09/23 0410 06/10/23 0349  WBC 24.4* 17.8* 13.9* 15.8* 14.8*  NEUTROABS 22.0*  --   --  14.1* 13.5*  HGB 10.6* 9.5* 9.0* 9.5* 10.7*  HCT 35.4* 32.5* 30.5* 32.2* 36.8*  MCV 77.1* 80.4 80.1 80.1 79.8*  PLT 337 288 290 311 358   Cardiac Enzymes: No results for input(s): "CKTOTAL", "CKMB", "CKMBINDEX", "TROPONINI" in the last 168 hours. BNP: Invalid input(s): "POCBNP" CBG: Recent Labs  Lab 06/09/23 0836 06/09/23 1134 06/09/23 1639 06/09/23 2117 06/10/23 0735  GLUCAP 222* 239* 245* 316* 257*   D-Dimer No results for input(s): "DDIMER" in the last 72 hours. Hgb A1c No results for input(s): "HGBA1C" in the last 72 hours. Lipid Profile No results for input(s): "CHOL", "HDL", "LDLCALC", "TRIG", "CHOLHDL",  "LDLDIRECT" in the last 72 hours. Thyroid function studies No results for input(s): "TSH", "T4TOTAL", "T3FREE", "THYROIDAB" in the last 72 hours.  Invalid input(s): "FREET3" Anemia work up No results for input(s): "VITAMINB12", "FOLATE", "FERRITIN", "TIBC", "IRON", "RETICCTPCT" in the last 72 hours. Urinalysis    Component Value Date/Time   COLORURINE YELLOW 06/06/2023 1153   APPEARANCEUR CLEAR 06/06/2023 1153   LABSPEC 1.010 06/06/2023 1153   PHURINE 5.5 06/06/2023 1153   GLUCOSEU >=500 (A) 06/06/2023 1153   HGBUR TRACE (A) 06/06/2023 1153   BILIRUBINUR NEGATIVE 06/06/2023 1153   KETONESUR NEGATIVE 06/06/2023 1153   PROTEINUR NEGATIVE 06/06/2023 1153   UROBILINOGEN 0.2 11/01/2014 1530   NITRITE NEGATIVE 06/06/2023 1153   LEUKOCYTESUR NEGATIVE 06/06/2023 1153   Sepsis Labs Recent Labs  Lab 06/07/23 0427 06/08/23 0406 06/09/23 0410 06/10/23 0349  WBC 17.8* 13.9* 15.8* 14.8*   Microbiology Recent Results (from the past 240 hours)  Culture, blood (Routine x 2)     Status: None (Preliminary result)   Collection Time: 06/06/23 11:58 AM   Specimen: BLOOD RIGHT HAND  Result Value Ref Range Status   Specimen Description   Final  BLOOD RIGHT HAND Performed at [facility], [address], [place], [place] [address]    Special Requests   Final    BOTTLES DRAWN AEROBIC AND ANAEROBIC Blood Culture results may not be optimal due to an inadequate volume of blood received in culture bottles Performed at [facility], [address] [place], [place] [address]    Culture   Final    NO GROWTH 4 DAYS Performed at [facility], [address], [place], [place] [address]    Report Status PENDING  Incomplete  Resp panel by RT-PCR (RSV, Flu A&B, Covid) Peripheral     Status: None   Collection Time: 06/06/23 12:18 PM   Specimen: Peripheral; Nasal Swab  Result Value Ref Range Status   SARS Coronavirus 2 by RT PCR NEGATIVE NEGATIVE Final    Comment:  (NOTE) SARS-CoV-2 target nucleic acids are NOT DETECTED.  The SARS-CoV-2 RNA is generally detectable in upper respiratory specimens during the acute phase of infection. The lowest concentration of SARS-CoV-2 viral copies this assay can detect is 138 copies/mL. A negative result does not preclude SARS-Cov-2 infection and should not be used as the sole basis for treatment or other patient management decisions. A negative result may occur with  improper specimen collection/handling, submission of specimen other than nasopharyngeal swab, presence of viral mutation(s) within the areas targeted by this assay, and inadequate number of viral copies(<138 copies/mL). A negative result must be combined with clinical observations, patient history, and epidemiological information. The expected result is Negative.  Fact Sheet for Patients:  BloggerCourse.com  Fact Sheet for Healthcare Providers:  SeriousBroker.it  This test is no t yet approved or cleared by the [place] FDA and  has been authorized for detection and/or diagnosis of SARS-CoV-2 by FDA under an Emergency Use Authorization (EUA). This EUA will remain  in effect (meaning this test can be used) for the duration of the COVID-19 declaration under Section 564(b)(1) of the Act, 21 U.S.C.section 360bbb-3(b)(1), unless the authorization is terminated  or revoked sooner.       Influenza A by PCR NEGATIVE NEGATIVE Final   Influenza B by PCR NEGATIVE NEGATIVE Final    Comment: (NOTE) The Xpert Xpress SARS-CoV-2/FLU/RSV plus assay is intended as an aid in the diagnosis of influenza from Nasopharyngeal swab specimens and should not be used as a sole basis for treatment. Nasal washings and aspirates are unacceptable for Xpert Xpress SARS-CoV-2/FLU/RSV testing.  Fact Sheet for Patients: BloggerCourse.com  Fact Sheet for Healthcare  Providers: SeriousBroker.it  This test is not yet approved or cleared by the [place] FDA and has been authorized for detection and/or diagnosis of SARS-CoV-2 by FDA under an Emergency Use Authorization (EUA). This EUA will remain in effect (meaning this test can be used) for the duration of the COVID-19 declaration under Section 564(b)(1) of the Act, 21 U.S.C. section 360bbb-3(b)(1), unless the authorization is terminated or revoked.     Resp Syncytial Virus by PCR NEGATIVE NEGATIVE Final    Comment: (NOTE) Fact Sheet for Patients: BloggerCourse.com  Fact Sheet for Healthcare Providers: SeriousBroker.it  This test is not yet approved or cleared by the [place] FDA and has been authorized for detection and/or diagnosis of SARS-CoV-2 by FDA under an Emergency Use Authorization (EUA). This EUA will remain in effect (meaning this test can be used) for the duration of the COVID-19 declaration under Section 564(b)(1) of the Act, 21 U.S.C. section 360bbb-3(b)(1), unless the authorization is terminated  or revoked.  Performed at [facility], [address] [place], [place] [address]   Culture, blood (Routine x 2)     Status: None (Preliminary result)   Collection Time: 06/06/23 12:19 PM   Specimen: BLOOD  Result Value Ref Range Status   Specimen Description   Final    BLOOD LEFT ANTECUBITAL Performed at [facility], [address] [place], [place] [address]    Special Requests   Final    BOTTLES DRAWN AEROBIC AND ANAEROBIC Blood Culture adequate volume Performed at [facility], [address] [place], [place] [address]    Culture   Final    NO GROWTH 4 DAYS Performed at [facility], [address], [place], [place] [address]    Report Status PENDING  Incomplete  Respiratory (~20 pathogens) panel by PCR     Status: None   Collection Time: 06/06/23   6:41 PM   Specimen: Nasopharyngeal Swab; Respiratory  Result Value Ref Range Status   Adenovirus NOT DETECTED NOT DETECTED Final   Coronavirus 229E NOT DETECTED NOT DETECTED Final    Comment: (NOTE) The Coronavirus on the Respiratory Panel, DOES NOT test for the novel  Coronavirus (2019 nCoV)    Coronavirus HKU1 NOT DETECTED NOT DETECTED Final   Coronavirus NL63 NOT DETECTED NOT DETECTED Final   Coronavirus OC43 NOT DETECTED NOT DETECTED Final   Metapneumovirus NOT DETECTED NOT DETECTED Final   Rhinovirus / Enterovirus NOT DETECTED NOT DETECTED Final   Influenza A NOT DETECTED NOT DETECTED Final   Influenza B NOT DETECTED NOT DETECTED Final   Parainfluenza Virus 1 NOT DETECTED NOT DETECTED Final   Parainfluenza Virus 2 NOT DETECTED NOT DETECTED Final   Parainfluenza Virus 3 NOT DETECTED NOT DETECTED Final   Parainfluenza Virus 4 NOT DETECTED NOT DETECTED Final   Respiratory Syncytial Virus NOT DETECTED NOT DETECTED Final   Bordetella pertussis NOT DETECTED NOT DETECTED Final   Bordetella Parapertussis NOT DETECTED NOT DETECTED Final   Chlamydophila pneumoniae NOT DETECTED NOT DETECTED Final   Mycoplasma pneumoniae NOT DETECTED NOT DETECTED Final    Comment: Performed at [facility], [address], [place], [place] [address]  MRSA Next Gen by PCR, Nasal     Status: None   Collection Time: 06/06/23  6:46 PM   Specimen: Nasal Mucosa; Nasal Swab  Result Value Ref Range Status   MRSA by PCR Next Gen NOT DETECTED NOT DETECTED Final    Comment: (NOTE) The GeneXpert MRSA Assay (FDA approved for NASAL specimens only), is one component of a comprehensive MRSA colonization surveillance program. It is not intended to diagnose MRSA infection nor to guide or monitor treatment for MRSA infections. Test performance is not FDA approved in patients less than 6 years old. Performed at [facility], [address], [place], [place] [address]   Expectorated Sputum  Assessment w Gram Stain, Rflx to Resp Cult     Status: None   Collection Time: 06/06/23  7:43 PM   Specimen: Sputum  Result Value Ref Range Status   Specimen Description SPUTUM  Final   Special Requests NONE  Final   Sputum evaluation   Final    THIS SPECIMEN IS ACCEPTABLE FOR SPUTUM CULTURE Performed at [facility], [address], [place], [place] [address]    Report Status 06/07/2023 FINAL  Final  Culture, Respiratory w Gram Stain     Status: None  Collection Time: 06/06/23  7:43 PM   Specimen: SPU  Result Value Ref Range Status   Specimen Description   Final    SPUTUM Performed at [facility], [address], [place], [place] [address]    Special Requests   Final    NONE Reflexed from [phone] Performed at [facility], [address], [place], [place] [address]    Gram Stain   Final    NO WBC SEEN FEW GRAM POSITIVE COCCI IN PAIRS FEW GRAM NEGATIVE RODS    Culture   Final    MODERATE Normal respiratory flora-no Staph aureus or Pseudomonas seen Performed at [facility], [address] [place], [place] [address]    Report Status 06/09/2023 FINAL  Final     Time coordinating discharge: 35 minutes  SIGNED:   Glade Lloyd, MD  [facility] 06/10/2023, 10:48 AM

## 2023-06-11 LAB — CULTURE, BLOOD (ROUTINE X 2)
Culture: NO GROWTH
Special Requests: ADEQUATE

## 2023-06-20 ENCOUNTER — Emergency Department (HOSPITAL_BASED_OUTPATIENT_CLINIC_OR_DEPARTMENT_OTHER): Payer: Medicare HMO

## 2023-06-20 ENCOUNTER — Other Ambulatory Visit: Payer: Self-pay

## 2023-06-20 ENCOUNTER — Emergency Department (HOSPITAL_BASED_OUTPATIENT_CLINIC_OR_DEPARTMENT_OTHER)
Admission: EM | Admit: 2023-06-20 | Discharge: 2023-06-20 | Disposition: A | Discharge status: Home / Self Care | Payer: Medicare HMO | Attending: Emergency Medicine | Admitting: Emergency Medicine

## 2023-06-20 ENCOUNTER — Encounter (HOSPITAL_BASED_OUTPATIENT_CLINIC_OR_DEPARTMENT_OTHER): Payer: Self-pay

## 2023-06-20 DIAGNOSIS — Z7951 Long term (current) use of inhaled steroids: Secondary | ICD-10-CM | POA: Diagnosis not present

## 2023-06-20 DIAGNOSIS — Z7901 Long term (current) use of anticoagulants: Secondary | ICD-10-CM | POA: Diagnosis not present

## 2023-06-20 DIAGNOSIS — R059 Cough, unspecified: Secondary | ICD-10-CM | POA: Insufficient documentation

## 2023-06-20 DIAGNOSIS — I10 Essential (primary) hypertension: Secondary | ICD-10-CM | POA: Diagnosis not present

## 2023-06-20 DIAGNOSIS — M542 Cervicalgia: Secondary | ICD-10-CM | POA: Insufficient documentation

## 2023-06-20 DIAGNOSIS — W1830XA Fall on same level, unspecified, initial encounter: Secondary | ICD-10-CM | POA: Insufficient documentation

## 2023-06-20 DIAGNOSIS — Z794 Long term (current) use of insulin: Secondary | ICD-10-CM | POA: Insufficient documentation

## 2023-06-20 DIAGNOSIS — R531 Weakness: Secondary | ICD-10-CM | POA: Diagnosis not present

## 2023-06-20 DIAGNOSIS — J449 Chronic obstructive pulmonary disease, unspecified: Secondary | ICD-10-CM | POA: Diagnosis not present

## 2023-06-20 DIAGNOSIS — G8929 Other chronic pain: Secondary | ICD-10-CM | POA: Diagnosis not present

## 2023-06-20 DIAGNOSIS — Z79899 Other long term (current) drug therapy: Secondary | ICD-10-CM | POA: Diagnosis not present

## 2023-06-20 DIAGNOSIS — R109 Unspecified abdominal pain: Secondary | ICD-10-CM | POA: Insufficient documentation

## 2023-06-20 DIAGNOSIS — M79661 Pain in right lower leg: Secondary | ICD-10-CM | POA: Insufficient documentation

## 2023-06-20 DIAGNOSIS — Z8673 Personal history of transient ischemic attack (TIA), and cerebral infarction without residual deficits: Secondary | ICD-10-CM | POA: Insufficient documentation

## 2023-06-20 DIAGNOSIS — Z7902 Long term (current) use of antithrombotics/antiplatelets: Secondary | ICD-10-CM | POA: Diagnosis not present

## 2023-06-20 DIAGNOSIS — E119 Type 2 diabetes mellitus without complications: Secondary | ICD-10-CM | POA: Diagnosis not present

## 2023-06-20 DIAGNOSIS — R519 Headache, unspecified: Secondary | ICD-10-CM | POA: Insufficient documentation

## 2023-06-20 DIAGNOSIS — W19XXXA Unspecified fall, initial encounter: Secondary | ICD-10-CM

## 2023-06-20 LAB — CBC
HCT: 35.5 % — ABNORMAL LOW (ref 39.0–52.0)
Hemoglobin: 10.6 g/dL — ABNORMAL LOW (ref 13.0–17.0)
MCH: 22.9 pg — ABNORMAL LOW (ref 26.0–34.0)
MCHC: 29.9 g/dL — ABNORMAL LOW (ref 30.0–36.0)
MCV: 76.7 fL — ABNORMAL LOW (ref 80.0–100.0)
Platelets: 377 10*3/uL (ref 150–400)
RBC: 4.63 MIL/uL (ref 4.22–5.81)
RDW: 16.6 % — ABNORMAL HIGH (ref 11.5–15.5)
WBC: 10.5 10*3/uL (ref 4.0–10.5)
nRBC: 0 % (ref 0.0–0.2)

## 2023-06-20 LAB — BASIC METABOLIC PANEL
Anion gap: 11 (ref 5–15)
BUN: 31 mg/dL — ABNORMAL HIGH (ref 8–23)
CO2: 28 mmol/L (ref 22–32)
Calcium: 8.2 mg/dL — ABNORMAL LOW (ref 8.9–10.3)
Chloride: 99 mmol/L (ref 98–111)
Creatinine, Ser: 1.87 mg/dL — ABNORMAL HIGH (ref 0.61–1.24)
GFR, Estimated: 37 mL/min — ABNORMAL LOW (ref >60)
Glucose, Bld: 78 mg/dL (ref 70–99)
Potassium: 3.4 mmol/L — ABNORMAL LOW (ref 3.5–5.1)
Sodium: 138 mmol/L (ref 135–145)

## 2023-06-20 LAB — CBG MONITORING, ED: Glucose-Capillary: 64 mg/dL — ABNORMAL LOW (ref 70–99)

## 2023-06-20 NOTE — Discharge Instructions (Addendum)
 You were seen for a fall. Your imaging is negative for a head bleed or head/neck/spine fracture. For your pain, we recommend that you take Tylenol  650 mg every 6 hours as needed. You can also apply a hot compress and/or over-the-counter lidocaine  patch to areas on your neck and/or back where you feel the most pain. Please resume activity slowly and as tolerated. We have placed a referral for our social work team to contact you to help establish home health physical therapy to help with your balance and overall strength. We recommend that you follow up with your primary care provider (PCP) if your pain worsens or you do not receive a follow up call for physical therapy services.   Please call 911 or go to the Emergency Department if you experience another fall, any changes in your thinking or speech, your headache persists and/or worsens in quality, or you feel any new weakness, numbness, or tingling in your extremities.

## 2023-06-20 NOTE — ED Triage Notes (Signed)
 The patient lost his balance today and hit his head on a brick wall. He denied LOC. He is now off balance and dizzy.

## 2023-06-20 NOTE — Care Management (Signed)
 Transition of Care [facility]) - Emergency Department Mini Assessment   Patient Details  Name: Mike Hunter MRN: [id number] Date of Birth: [birth year]  [facility] CM/SW Contact:    Corean JAYSON Canary, RN Phone Number: 06/20/2023, 2:54 PM   Clinical Narrative: Consult for Home health DME services. Discussed with team. Patient was discharged on 12/25 with Home health [facility], however they never received services. Called Channing Ee with [facility]. They had a computer glitch that was fixed, but the consult went over to [place] office yesterday. Spoke to [name] on cell phone, she stated that they had not heard from [facility]. Information on AVS. Also they are requesting 4 wheeled walker with seat, the walker he currently has in many years old Called [employer] for walker and it is ordered. Let Channing from [facility] know that the wife prefers to be called on home phone   ED Mini Assessment: What brought you to the Emergency Department? : Difficult ambulation  Barriers to Discharge: ED No Barriers        Interventions which prevented an admission or readmission: Home Health Consult or Services, DME Provided    Patient Contact and Communications     Called and spoke to wife [name]   ,                 Admission diagnosis:  fall head injury Patient Active Problem List   Diagnosis Date Noted   Moderate persistent asthma with exacerbation 06/07/2023   COPD with acute exacerbation (HCC) 06/07/2023   Pneumonia of right lung due to infectious organism 06/07/2023   Sepsis due to pneumonia (HCC) 06/06/2023   Hypokalemia 09/26/2018   Hypomagnesemia 09/26/2018   Acute on chronic diastolic CHF (congestive heart failure) (HCC) 09/26/2018   CAD (coronary artery disease) 09/26/2018   Asthma 09/26/2018   Depression with anxiety 09/26/2018   Asthma with status asthmaticus    AKI (acute kidney injury) (HCC) 09/25/2018   Type II or unspecified type diabetes mellitus with  neurological manifestations, not stated as uncontrolled(250.60) 02/09/2013   Unspecified urinary incontinence 02/09/2013   Esophageal reflux 02/09/2013   Insulin -requiring or dependent type II diabetes mellitus (HCC) 12/30/2012   Pyogenic arthritis of knee (HCC) 12/30/2012   PCP:  Nikki Hansel Atlas, MD Pharmacy:   [id number] [place], [place] - [address] [place]  [address] Phone: [phone] Fax: [phone]  [facility] [address] [place] [address] Phone: [phone] Fax: [phone]  [id number] [place], [place] - [address] [place] [address] Phone: [phone] Fax: [phone]

## 2023-06-20 NOTE — ED Provider Notes (Signed)
 [place] [facility] AT [facility] Provider Note   CSN: [id number] Arrival date & time: 06/20/23  1201     History  Chief Complaint  Patient presents with   Fall   Head Injury   Mike Hunter is a 73 y.o. male with a history of cervical spinal fusion, CVA, HTN, pAfib on Xarelto , aortic valve stenosis, T2DM, HLD, GERD, COPD, and OSA w/ CPAP on 2L O2 at bedtime who presents to the ED following a fall and head injury. Patient is accompanied by his spouse and grandson, Fonda.  History obtained mostly from patient, with information supplemented by patient's spouse and grandson as needed. Family was on their way to lunch earlier today, patient was walking with a walking stick when he suddenly felt off balance and called out to his grandson. Before his grandson could assist, patient fell over and hit his right temple against a brick wall. Patient was able to stand unassisted. Denies LOC at the time of the incident or bleeding/bruising from impact site. Patient denies any confusion, nausea, or vision changes. Endorses drinking and eating ok prior to the fall, has chronic prostatitis and difficulties with urination. Does endorse a slight headache which started on the right side of his temple and is now present across his forehead. Is also experiencing some neck pain and feels lightheaded/unsteady on his feet. Of note, patient was recently discharged from [facility] for pneumonia (12/21-12/25/24) and was seen by PCP yesterday 06/18/22 with patient still experiencing significant coughing. There were some medication changes following yesterday's appointment, including decreasing his torsemide  10 mg BID to 40 mg AM and 20 mg PM and decreasing amiodarone  from BID to once daily. 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 Was supposed to continue working with PT after recent discharge on 06/10/23 but has not received a call to  establish services. SW here to contact patient regarding rollator/equipment and establishing OT/PT services at home.   The history is provided by the patient.  Fall This is a new problem.  Head Injury Location:  R temporal Pain details:    Quality:  Dull and aching      Home Medications Prior to Admission medications   Medication Sig Start Date End Date Taking? Authorizing Provider  albuterol  (PROVENTIL  HFA;VENTOLIN  HFA) 108 (90 BASE) MCG/ACT inhaler Inhale 2 puffs into the lungs every 4 (four) hours as needed for wheezing.    [provider]  amiodarone  (PACERONE ) 200 MG tablet Take 200 mg by mouth 2 (two) times daily. 04/22/22   [provider]  atorvastatin  (LIPITOR ) 40 MG tablet Take 40 mg by mouth every evening.     [provider]  budesonide  (PULMICORT ) 0.5 MG/2ML nebulizer solution Take 2 mLs by nebulization 2 (two) times daily.    [provider]  budesonide -formoterol  (SYMBICORT ) 160-4.5 MCG/ACT inhaler Inhale 2 puffs into the lungs 2 (two) times daily. 07/03/16   [provider]  carvedilol  (COREG ) 6.25 MG tablet Take 12.5 mg by mouth 2 (two) times daily with a meal.     [provider]  clopidogrel  (PLAVIX ) 75 MG tablet Take 1 tablet by mouth daily. 05/18/23 05/17/24  [provider]  dextromethorphan -guaiFENesin  (MUCINEX  DM) 30-600 MG 12hr tablet Take 1 tablet by mouth 2 (two) times daily. 06/10/23   Cheryle Page, MD  donepezil  (ARICEPT ) 10 MG tablet Take 10 mg by mouth at bedtime.    [provider]  eszopiclone (LUNESTA) 2 MG TABS tablet Take 2 mg by mouth at bedtime as needed. 04/23/23   [provider]  insulin  aspart (NOVOLOG  FLEXPEN) 100 UNIT/ML injection Inject 26-44 Units into the skin See admin instructions. 5 units prior to meals for cbg>= 150  Sliding scale (base is 26u in the AM, 30u at noon, and  36-44 at night    [provider]  Insulin  Degludec (TRESIBA  FLEXTOUCH [place]) Inject  40-50 Units into the skin See admin instructions. 40 morning, 50 night    [provider]  isosorbide  mononitrate (IMDUR ) 30 MG 24 hr tablet Take 1 tablet by mouth daily. 01/22/22   [provider]  latanoprost  (XALATAN ) 0.005 % ophthalmic solution Place 1 drop into both eyes at bedtime.      [provider]  memantine  (NAMENDA ) 5 MG tablet Take 5 mg by mouth 2 (two) times daily.    [provider]  mirabegron  ER (MYRBETRIQ ) 50 MG TB24 tablet Take 1 tablet by mouth daily. 12/05/22 11/30/23  [provider]  montelukast  (SINGULAIR ) 10 MG tablet Take 10 mg by mouth at bedtime.    [provider]  omeprazole (PRILOSEC) 40 MG capsule Take 1 capsule by mouth at bedtime. 02/02/17   [provider]  oxybutynin  (DITROPAN -XL) 10 MG 24 hr tablet Take 10 mg by mouth daily.    [provider]  sertraline  (ZOLOFT ) 50 MG tablet Take 50 mg by mouth every morning.     [provider]  sodium bicarbonate  650 MG tablet Take 1 tablet (650 mg total) by mouth 3 (three) times daily. 06/10/23   Cheryle Page, MD  spironolactone  (ALDACTONE ) 25 MG tablet Take 25 mg by mouth daily.    [provider]  torsemide  (DEMADEX ) 20 MG tablet Take 40 mg by mouth 2 (two) times daily.    [provider]  NAPOLEON INHUB 250-50 MCG/ACT AEPB Inhale 1 puff into the lungs in the morning and at bedtime. 04/20/23   [provider]  XARELTO  20 MG TABS tablet Take 20 mg by mouth daily with supper. 04/04/22   [provider]      Allergies    Buprenorphine hcl, Hydromorphone , Codeine, Mirtazapine, and Morphine  and codeine    Review of Systems   Review of Systems  Physical Exam Updated Vital Signs BP (!) 137/37   Pulse 68   Temp 98.4 F (36.9 C)   Resp 18   Ht 5' 11 (1.803 m)   Wt 117 kg   SpO2 98%   BMI 35.98 kg/m  Physical Exam Constitutional:      General: He is not in acute distress.    Appearance: He is obese.   HENT:     Head: Normocephalic.     Comments: No obvious abrasions or bruising, no overt bleeding.     Mouth/Throat:     Mouth: Mucous membranes are moist.  Eyes:     Pupils: Pupils are equal, round, and reactive to light.     Comments: Patient denies acute changes to vision.   Neck:     Comments: ROM limited by pain.  Cardiovascular:     Rate and Rhythm: Rhythm irregular.     Pulses: Normal pulses.     Comments: RUSB systolic murmur on auscultation  Pulmonary:     Effort: Pulmonary effort is normal.     Comments: Intermittent coughing with inspiration Abdominal:     General: Bowel sounds are normal.     Palpations: Abdomen  is soft.     Comments: Chronic left sided tenderness  Musculoskeletal:     Comments: Moving all extremities spontaneously, slightly weaker on RLE compared to LLE. Expressed some pain when lifting his right LE against gravity however seems to be his baseline.   Neurological:     Mental Status: He is alert. Mental status is at baseline.     Comments: Patient is alert to self, location, date, and situation.   Psychiatric:        Mood and Affect: Mood normal.        Behavior: Behavior normal.     ED Results / Procedures / Treatments   Labs (all labs ordered are listed, but only abnormal results are displayed) Labs Reviewed  CBC - Abnormal; Notable for the following components:      Result Value   Hemoglobin 10.6 (*)    HCT 35.5 (*)    MCV 76.7 (*)    MCH 22.9 (*)    MCHC 29.9 (*)    RDW 16.6 (*)    All other components within normal limits  BASIC METABOLIC PANEL - Abnormal; Notable for the following components:   Potassium 3.4 (*)    BUN 31 (*)    Creatinine, Ser 1.87 (*)    Calcium  8.2 (*)    GFR, Estimated 37 (*)    All other components within normal limits  CBG MONITORING, ED - Abnormal; Notable for the following components:   Glucose-Capillary 64 (*)    All other components within normal limits   EKG None  Radiology DG Lumbar Spine  Complete Result Date: 06/20/2023 CLINICAL DATA:  fall EXAM: LUMBAR SPINE - COMPLETE 4+ VIEW COMPARISON:  None Available. FINDINGS: Limited evaluation due to overlapping osseous structures and overlying soft tissues. Multilevel moderate degenerative changes spine. Severe degenerative changes at the L5-S1 level. No radiographic findings suggest osseous neural foraminal stenosis. There is no evidence of lumbar spine fracture. Alignment is normal. Multilevel mild intervertebral disc space narrowing. IMPRESSION: No acute displaced fracture or traumatic listhesis of the lumbar spine. Limited evaluation due to overlapping osseous structures and overlying soft tissues. Electronically Signed   By: Morgane  Naveau M.D.   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 Multiplanar CT image reconstructions of the cervical spine were also generated. RADIATION DOSE REDUCTION: This exam was performed according to the departmental dose-optimization program which includes automated exposure control, adjustment of the mA and/or kV according to patient size and/or use of iterative reconstruction technique. COMPARISON:  CT head 09/13/2019, MRI head and C-spine 09/04/2009 FINDINGS: CT HEAD FINDINGS Brain: No evidence of large-territorial acute infarction. No parenchymal hemorrhage. No mass lesion. No extra-axial collection. No mass effect or midline shift. No hydrocephalus. Basilar cisterns are patent. Vascular: No hyperdense vessel. Atherosclerotic calcifications are present within the cavernous internal carotid arteries. Skull: No acute fracture or focal lesion. Sinuses/Orbits:  Bilateral maxillary, bilateral ethmoid, right frontal mucosal thickening. Paranasal sinuses and mastoid air cells are clear. Bilateral lens replacement. Otherwise the orbits are unremarkable. Other: None. CT CERVICAL SPINE FINDINGS Alignment: Grade 1 anterolisthesis of C3 on C4 and C4 on C5. Skull base and vertebrae: Multilevel mild degenerative changes of the spine  with moderate degenerative changes at the C7-T1 and C1-C2 levels. C5 through C7 anterior cervical discectomy surgical hardware fusion. 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 No acute fracture. No aggressive appearing focal osseous lesion or focal pathologic process. Soft tissues and spinal canal: No prevertebral fluid or swelling. No visible canal hematoma. Upper chest: Unremarkable. Other: Atherosclerotic plaque of the carotid arteries within the neck. IMPRESSION: 1. No acute intracranial abnormality. 2. No acute displaced fracture or traumatic listhesis of the cervical spine. 3. 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 Electronically Signed   By: Morgane  Naveau M.D.   On: 06/20/2023 13:49   CT Cervical Spine Wo Contrast Result Date: 06/20/2023 CLINICAL DATA:  Head trauma, [place] (Age >= 65y) Hit head this morning, on anticoagulation; Neck pain, acute, prior cervical surgery EXAM: CT HEAD WITHOUT CONTRAST CT CERVICAL SPINE WITHOUT CONTRAST TECHNIQUE: Multidetector CT imaging of the head and cervical spine was performed following the standard protocol without intravenous contrast. Multiplanar CT image reconstructions of the cervical spine were also generated. RADIATION DOSE REDUCTION: This exam was performed according to the departmental dose-optimization program which includes automated exposure control, adjustment of the mA and/or kV according to patient size and/or use of iterative reconstruction technique. COMPARISON:  CT head  09/13/2019, MRI head and C-spine 09/04/2009 FINDINGS: CT HEAD FINDINGS Brain: No evidence of large-territorial acute infarction. No parenchymal hemorrhage. No mass lesion. No extra-axial collection. No mass effect or midline shift. No hydrocephalus. Basilar cisterns are patent. Vascular: No hyperdense vessel. Atherosclerotic calcifications are present within the cavernous internal carotid arteries. Skull: No acute fracture or focal lesion. Sinuses/Orbits: Bilateral maxillary, bilateral ethmoid, right frontal mucosal thickening. Paranasal sinuses and mastoid air cells are clear. Bilateral lens replacement. Otherwise the orbits are unremarkable. Other: None. CT CERVICAL SPINE FINDINGS Alignment: Grade 1 anterolisthesis of C3 on C4 and C4 on C5. Skull base and vertebrae: Multilevel mild degenerative changes of the spine with moderate degenerative changes at the C7-T1 and C1-C2 levels. C5 through C7 anterior cervical discectomy surgical hardware fusion. 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 No acute fracture. No aggressive appearing focal osseous lesion or focal pathologic process. Soft tissues and spinal canal: No prevertebral fluid or swelling. No visible canal hematoma. Upper chest: Unremarkable. Other: Atherosclerotic plaque of the carotid arteries within the neck. IMPRESSION: 1. No acute intracranial abnormality. 2. No acute displaced fracture or traumatic listhesis of the cervical spine. 3. 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 Electronically Signed   By: Morgane  Naveau M.D.   On: 06/20/2023 13:49   Procedures Procedures    Medications Ordered in ED Medications - No data to display  ED Course/ Medical Decision Making/ A&P                                 Medical Decision Making Patient presented after a fall with no LOC or AMS. Did present with slight headache that has become more  generalized with time. Patient is also presenting with neck pain and RLE pain with slight weakness, although these may be due to an acute exacerbation of chronic and otherwise stable issues. Did not initiate stroke work up as patient did not exhibit any new focal deficits on exam. Labs relatively unchanged from labs with  PCP yesterday 06/18/22, with a notable improvement in WBC following treatment for pneumonia compared to labs during recent hospitalization. Imaging, including CT head, CT cervical spine, and DG lumbar without signs of acute bleeding, fracture, or mass effect. At this time, low suspicion for acute vertebral dissection given stable vital signs and stable to improving symptoms since presenting to ED.   Interventions in ED include application of a soft C-collar and PO snack for CBG 64. Patient was able to ambulate with nursing assistance. At this time patient is medically stable for discharge to home. Placed orders for social work consult to help establish HH physical therapy and occupational therapy. Social work able to speak with patient's spouse and instructions placed in discharge summary. Patient education provided to patient, spouse, and grandson. Recommended outpatient pain management with Tylenol , heat/cold compresses, lidocaine  patches, rest, and follow-up with PCP should symptoms persist or worsen. Patient and his family endorsed understanding.   Amount and/or Complexity of Data Reviewed Labs: ordered. Radiology: ordered.         Final Clinical Impression(s) / ED Diagnoses Final diagnoses:  Fall, initial encounter    Rx / DC Orders ED Discharge Orders     None      Reka Wist Arellano Zameza, MD PGY-1 [facility] Internal Medicine Teaching Service   Arellano Zameza, Richrd, MD 06/20/23 [id number]    Dreama Longs, MD 06/25/23 [id number]

## 2023-06-27 NOTE — Unmapped External Note (Addendum)
 Acute OT Evaluation   Co-Eval with PT   Precautions   Precautions Other Therapy Precautions: Spinal, Other (Comment), Fall risk Bracing: TLSO when OOB Comment: SAGE  Medical Diagnosis/Course:  OT Diagnosis/Course OT Medical Dx: Thoracis compression fractures Pertinent Medical Course: Patient admitted to [facility] following fall. Patient hit his head, with no LOC, with x-rays showing thoracic compressions fractures at T3 and T10.   OT Treatment Diagnosis:    Impaired ADLs  Assessment Summary:   Patient is a 73 year old admitted to [facility] secondary to a fall resulting in compression fractures of T3 and T10. This is his second fall in the past week. Patient's pertinent medical history includes HTN, DM, CAD, L carotid artery stent, A-Fib, OSA, CKD, restrictive lung disease, asthma, AAA, and HLD. At patient's baseline he was independent with dressing and supervision with bathing and toileting. Patient received assistance for IADLs. He was also providing supervision and assistance as needed to his wife who has a history of multiple strokes. Currently, patient requires assistance for ADLs, transfers, bed mobility, and functional mobility. Education was provided to patient regarding spinal precautions, adaptive equipment, and TLSO brace. Current deficits include functional mobility, participation in ADLs, balance, activity tolerance, and pain limiting function. The [facility] AM-PAC ADL score is 14, which indicates a 59.67% impairment. Once medically cleared, patient would benefit from post acute inpatient therapy. At this time, patient would benefit from continued OT services in the acute care setting to increase independence with ADLs and functional transfers.   Co-evaluation was completed secondary to decreased activity tolerance, medical complexity, the need for increased assistance from baseline, and for discharge planning.   Per the evaluation and assessment as completed by this therapist, the  patient presents with deficits that impair functional mobility and ADLs, requiring a higher level of physical assistance than what is currently available and safe in the home setting. The patient appears to be most appropriate for multidisciplinary therapy services in the post-acute inpatient setting with a frequency of 5x/week or higher to further improve functional abilities and quality of life via progression towards individualized patient/family-centered goals.    OT Recommendation: OT Recommendation: Rehabilitation Facility   OT Equipment Recommended: OT Equipment Recommended: Defer to next level of care   Goals: Encounter Problems     Encounter Problems (Active)     Occupational Therapy     Patient will perform upper body dressing with stand by assistance using AE and compensatory strategies as needed for increased independence and decreased caregiver burden at discharge.     Start:  06/27/23    Expected End:  07/18/23      1/11 - max A to don open front gown (CC)      Patient will perform lower body dressing with minimal assistance using AE and compensatory strategies as needed for increased independence and decreased caregiver burden at discharge.     Start:  06/27/23    Expected End:  07/18/23      1/11 - dependent to don socks (CC)      Patient will perform toileting with contact guard assistance using AE and compensatory strategies as needed for increased independence and decreased caregiver burden at discharge.     Start:  06/27/23    Expected End:  07/18/23      1/11 - not assessed on this date (CC)       Patient Specific Goal 1     Start:  06/27/23    Expected End:  07/18/23  Client will demonstrate skill to don/doff TLSO independently for increased independence and decreased caregiver burden at discharge.   1/11 - max A  to don (CC)          Plan: Plan Patient and Family Goals: to go to rehab Treatment Plan/Goals Established with Patient/Caregiver:  Yes Planned Treatment/Interventions: Basic activities of daily living, Functional Mobility training, Therapeutic activities, Therapeutic exercises OT Frequency: 2x week   Rehab Potential: Rehab Potential Rehab Potential: Good Complexity/Co-morbidities that Impact POC: Reduced activity level Impairments/Limitations: Activity Tolerance Deficits, Balance Deficits, Basic Activity of Daily living deficits, Mobility deficits, Pain limiting function    PAST MEDICAL HISTORY:  Past Medical History:  Diagnosis Date  . Abnormal finding on MRI of brain   . Adjustment disorder with mixed anxiety and depressed mood   . Anemia due to stage 3b chronic kidney disease (HCC)   . Aortic atherosclerosis (HCC)   . Arteriosclerosis of coronary artery   . Asthma-COPD overlap syndrome (CMD)   . Astigmatism of left eye   . Benign hypertension with chronic kidney disease, stage III (HCC)   . Benign prostatic hyperplasia with urinary obstruction   . Bipolar disorder (CMD)   . Cervical radiculopathy   . Cervical stenosis of spinal canal   . Cherry angioma   . Chronic diastolic CHF (congestive heart failure) (CMD)   . Chronic heart failure with preserved ejection fraction (HFpEF) (CMD)   . Chronic midline low back pain without sciatica   . Chronic nonseasonal allergic rhinitis due to pollen   . CKD (chronic kidney disease)   . Cognitive disorder   . Coronary artery disease involving native coronary artery of native heart without angina pectoris   . Decreased functional mobility and endurance   . Delayed emergence from anesthesia   . Dependence on supplemental oxygen   . Depression   . Diabetes mellitus with stage 3 chronic kidney disease, with long-term current use of insulin  (HCC)   . Diabetic autonomic neuropathy (HCC)   . Diabetic polyneuropathy (CMD)   . Diabetic retinopathy (CMD)   . Epiretinal membrane (ERM) of right eye   . Episodic mood disorder (CMD)   . Essential hypertension   . GAD  (generalized anxiety disorder)   . Gastroesophageal reflux disease without esophagitis   . Glaucoma, unspecified glaucoma type, unspecified laterality   . Hearing loss   . History of fall   . Hypercholesterolemia   . Hyperlipidemia   . Hypertension associated with diabetes (HCC)   . Hypomagnesemia   . Impingement syndrome of right shoulder   . Iron  deficiency anemia 03/24/2017  . Irregular astigmatism of right eye   . Keratoconus of both eyes   . Left nephrolithiasis   . Mixed stress and urge urinary incontinence   . Moderate nonproliferative diabetic retinopathy of both eyes with macular edema associated with type 2 diabetes mellitus (HCC)   . Moderate persistent asthma without complication   . Multiple benign nevi   . Nocturnal hypoxia   . OAB (overactive bladder)   . Obesity due to excess calories with serious comorbidity   . Obstructive sleep apnea syndrome   . Onychogryphosis   . OSA (obstructive sleep apnea)   . Paroxysmal atrial fibrillation (HCC)   . Peptic ulcer   . Peripheral neuropathy   . PONV (postoperative nausea and vomiting)   . Primary open angle glaucoma of both eyes, moderate stage   . Primary osteoarthritis involving multiple joints   . Pruritic dermatitis   .  Pulmonary nodules/lesions, multiple   . Pyogenic arthritis of knee (CMD)   . Restrictive lung disease   . S/P cervical spinal fusion   . Seborrheic keratoses   . Squamous cell carcinoma in situ   . Stage 2 moderate COPD by GOLD classification (HCC)   . Stroke (CMD)   . Subcutaneous nodules   . Type 2 diabetes mellitus with both eyes affected by mild nonproliferative retinopathy without macular edema, with long-term current use of insulin  (HCC)   . Type 2 diabetes mellitus with diabetic polyneuropathy (HCC)   . Vitamin D deficiency   . Walker as ambulation aid   . Xerosis cutis     PAST SURGICAL HISTORY:  Past Surgical History:  Procedure Laterality Date  . ANKLE FRACTURE SURGERY Left 2014   . ANTERIOR CERVICAL DISCECTOMY W/ FUSION N/A 08/07/2015   ANTERIOR CERVICAL DISCECTOMY & FUSION MULTI LEVEL C5-6 and C6-7 DePuy;  Surgeon: Rockey Ozell Chaco, MD;  Location: [facility] MAIN OR;  Service: Neurosurgery;  Laterality: N/A;  . BLEPHAROPLASTY Bilateral    BLEPHAROPLASTY UPPER LIDS  . CARDIAC CATHETERIZATION     x3 w/ last ~ @2009 ; no interventions  . CATARACT EXTRACTION W/  INTRAOCULAR LENS IMPLANT Left    Procedure: CATARACT EXTRACTION LEFT EYE /W IMPLANT;  Surgeon: Ozell Lemond Sar, MD;  Location: [facility] OUTPATIENT OR;  Service: Ophthalmology;  Laterality: Left;  30 TIP; PF LIDOCAINE , IDDM, TORIC, I-STENT  . CATARACT EXTRACTION W/  INTRAOCULAR LENS IMPLANT Right 05/17/2019   Procedure: CATARACT EXTRACTION RIGHT EYE /W IMPLANT;  Surgeon: Ozell Lemond Sar, MD;  Location: [facility] OUTPATIENT OR;  Service: Ophthalmology;  Laterality: Right;  30TIP; PF LIDOCAINE ; IDDM, I-STENT; TORIC  . CATARACT EXTRACTION W/  INTRAOCULAR LENS IMPLANT Left 02/08/2019   Procedure: CATARACT EXTRACTION W/  INTRAOCULAR LENS IMPLANT; MWE 10.0 [id number]  SN [id number], (1) iStent S/N [id number]   . CATARACT EXTRACTION W/  INTRAOCULAR LENS IMPLANT Right 05/17/2019   Procedure: CATARACT EXTRACTION W/  INTRAOCULAR LENS IMPLANT; [id number] Toric 13.5D  Istent [id number]  MWE  . CHOLECYSTECTOMY     Dr Murvin and no gallbladder noted  . COLONOSCOPY    . CYST REMOVAL     From [place]  . CYSTOSCOPY W/ STONE MANIPULATION Left 10/16/2020   left retrogrades, left URS with laser, left JJ stent placement  . ESOPHAGOGASTRODUODENOSCOPY Left 04/06/2017  . INTRAVITREAL INJECTIONS OF PHARMACOLOGIC AGENT Bilateral    For NPDR  . LEVATOR ADVANCEMENT/RESECTION  10/22/2011   Procedure: LEVATOR RESECTION FOR PTOSIS;  Surgeon: Lamar Belvie Seashore, MD;  Location: [facility] OUTPATIENT OR;  Service: Ophthalmology;  Laterality: Bilateral;  . MEMBRANECTOMY Right 09/24/2021   MEMBRANE PEEL;  Surgeon: Jeppie Anner Hint II, MD  .  PARS PLANA VITRECTOMY Right 09/24/2021   PARS PLANA VITRECTOMY 25GA;  Surgeon: Jeppie Anner Hint II, MD  . TOTAL KNEE ARTHROPLASTY Left 2014   x 2 (MRSA in original knee replacement)  . TOTAL KNEE ARTHROPLASTY Right 12/25/2022   TOTAL KNEE ARTHROPLASTY performed by Vinie Carlin Fonder, MD at [facility] OR  . TRANSURETHRAL RESECTION OF PROSTATE    . TYMPANOSTOMY TUBE PLACEMENT     as an adult       Subjective: I don't feel like I would be safe to go home.  Patient's stated goal: to go to rehab  Pain: Pain Assessment Pain Assessment: Wong-Baker FACES Wong-Baker FACES Pain Rating: Hurts little more (4 at rest, 10 with movement) Pain Location: Other (Comment) (back and head) Pain Intervention(s): Repositioned  Home  Living  Home Living Type of Home: House Home Layout: One level Exterior Stairs-number: 3 Exterior Stairs-rails: Right going up Interior Stairs-number: 0 Air Traffic Controller / Tub: Furniture Conservator/restorer: Grab bars in shower, Paediatric nurse, Raised toilet height Home Equipment: Immunologist, Cane-single point   Prior Function  Prior Function Level of Independence: Independent Lives With: Spouse Person(s)  able to assist at discharge: Wife: 24 hour supervision but cannot provide any assistance Patient Responsibilities : Personal ADLs, Other (comment) (provides assistance for wife when needed who has history of multiple strokes.) Patient had home O2.  Fall History: Subjective Fall History Fall in the last year?: Yes Fall/Injury Details: Current addmission is secondary to fall Feel unsteady or off balance?: Yes  Cognition  Cognition Orientation Level: Oriented x4 Overall Cognitive Status: Within Functional Limits   Assessed in the Presence of: PT Erminio Consent for therapy provided by: Nurse  Appearance: Lines/Leads: O2 2L and saline lock  Skin: Skin Integrity & Edema Skin: Intact in visualized areas   Vision/Perception and Hearing   Vision/Perception and Hearing  Current Vision: Wears glasses all the time Did patient use glasses during this session?: Yes Vision Screen: Within Functional Limits Hearing: Within Functional Limits   ROM  ROM Assessment Overall ROM Assessment: Bilateral UEs WFL ROM Additional Comments: Please see PT evaluation for BLE information.  Strength  Strength Assessment Overall Strength Assessment: Bilateral UEs WFL (not formally assessed, however appears functional throughout evaluation.) Strength Comments: Please see PT evaluation for BLE information.     Sensation Additional Comments: patient reports tingling in RLE, which he reports he had at baseline.         Coordination:  Hand Dominance: Right R Finger Opposition: Intact L Finger Opposition: Intact R Pronation/Supination: Impaired - secondary to previous injuries L Pronation/Supination: Impaired - secondary to previous injuries  Sensation: Touch: UE intact and LB impaired - reports tingling in RLE, however states this has been since his stent placement.  ADLs: ADLs UB Dressing: Maximal Assistance, Extra time (Patient required max A to don open front gown and max A to don TLSO brace.) UB Dressing Level: Edge of bed LB Dressing: Dependent (patient was dependent in donning socks on this date.) LB Dressing Level: Bed Level Assistive Device: Walker-rolling Donning/Doffing Brace: Maximal Assistance Brace Type: TLSO  AM-PAC - Daily Activity: AM-PAC 6-Clicks - Daily Activity Lower Body Clothing Activity: Total Assist Bathing: A lot Toileting: A lot Upper Body Clothing Activity: A lot Personal Grooming: A little Eating Meals: None AM-PAC Total Score: 14  Balance  Balance Sitting - Static:  (good) Sitting - Dynamic:  (poor) Standing - Static:  (poor) Standing - Dynamic:  (poor)   Bed Mobility  Bed Mobility Rolling: Close supervision, Verbal Cues, Rolling - Right, Extra time Supine to Sit: Moderate Assistance, 2  people  Scooting:  Anterior to EOB with CGA and posterior in recliner with close supervision  Transfers: Transfers Assistive Device: Walker-rolling Sit to Stand: Geophysicist/field Seismologist Stand to Sit: Advertising Account Executive Assistance   Mobility: Mobility Gait Distance (ft):  (patient ambulated a couple steps to recliner with RW and min A with verbal cues for technique.)   Condition After Therapy: Condition After Therapy Condition After Therapy: Up in chair, Nursing notified of condition, All needs within reach, Heels floated, Alarm activated, Lines intact    Education:  Education provided to: Patient Education provided regarding:  Role and POC of OT, Importance of increasing activity, OOB to chair for all meals, Call and wait for assistance, Discharge  recs, Precautions, and Adaptive ADL strategies Response to Education: Patient verbalizes understanding  Treatment Provided: Therapist provided education to patient regarding when to wear TLSO, how to don/doff/adjust TLSO, and adaptive equipment available to prevent repetitive bending. Patient required max A to don TLSO brace and max A to adjust it on first trial and was independent in adjusting thumb pulls on second trial.   Therapeutic Interventions:  Self Care: 8 minutes - please see treatment provided.  Response to Treatment:   Demonstrates carryover of treatment strategies  and Improved ADL performance   Skill Performed by Clinician: Education on proper use of assistive device/adaptive equipment , Education on compensatory strategies , and Education on precautions for safe performance of functional tasks   Time In: 0940 Time Out: 1010 Time in Timed codes: 8 Total Treatment Time: 22 OT Eval Moderate Complexity minutes: 14   Treatment/Procedures Time Entry Self Care/Home Management Training minutes: 8    Charges           06/27/2023   Code Description Service Provider Modifiers Quantity  [id number] Hc Ot Self-Care/Home  Mgmt Training Each 15 Minutes [address] [place], OT/L GO 1  [id number] Hc Ot Occupational Therapy Eval Mod Complex 45 Mins [employer], OT/L GO 1        Time of Service Note Type Status  None            OT Evaluation Complexity Occupational Profile/Medical and Therapy History: Review of medical and/or therapy records and extensive additional review of physical, cognitive or psychosocial history related to current functional performance Therapy Evaluation Assessment: 3-5 performance deficits relating to physical, cognitive, psychosocial limitations/restrictions Clinical Decision Making: Moderate analytical complexity, detailed assessments, minimal to moderate modification of assessments, may have comorbidities Evaluation Complexity Score: 21 OT Eval Complexity Score: Moderate Complexity

## 2023-08-24 ENCOUNTER — Other Ambulatory Visit: Payer: Self-pay

## 2023-08-24 ENCOUNTER — Emergency Department (HOSPITAL_BASED_OUTPATIENT_CLINIC_OR_DEPARTMENT_OTHER)

## 2023-08-24 ENCOUNTER — Encounter (HOSPITAL_BASED_OUTPATIENT_CLINIC_OR_DEPARTMENT_OTHER): Payer: Self-pay

## 2023-08-24 ENCOUNTER — Inpatient Hospital Stay (HOSPITAL_BASED_OUTPATIENT_CLINIC_OR_DEPARTMENT_OTHER)
Admission: EM | Admit: 2023-08-24 | Discharge: 2023-08-26 | DRG: 273 | Disposition: A | Discharge status: Home / Self Care | Attending: Family Medicine | Admitting: Family Medicine

## 2023-08-24 DIAGNOSIS — E119 Type 2 diabetes mellitus without complications: Secondary | ICD-10-CM

## 2023-08-24 DIAGNOSIS — J189 Pneumonia, unspecified organism: Secondary | ICD-10-CM | POA: Diagnosis present

## 2023-08-24 DIAGNOSIS — Z8614 Personal history of Methicillin resistant Staphylococcus aureus infection: Secondary | ICD-10-CM

## 2023-08-24 DIAGNOSIS — N3000 Acute cystitis without hematuria: Secondary | ICD-10-CM

## 2023-08-24 DIAGNOSIS — E876 Hypokalemia: Secondary | ICD-10-CM | POA: Diagnosis present

## 2023-08-24 DIAGNOSIS — I350 Nonrheumatic aortic (valve) stenosis: Secondary | ICD-10-CM | POA: Diagnosis present

## 2023-08-24 DIAGNOSIS — I130 Hypertensive heart and chronic kidney disease with heart failure and stage 1 through stage 4 chronic kidney disease, or unspecified chronic kidney disease: Secondary | ICD-10-CM | POA: Diagnosis present

## 2023-08-24 DIAGNOSIS — N400 Enlarged prostate without lower urinary tract symptoms: Secondary | ICD-10-CM | POA: Diagnosis present

## 2023-08-24 DIAGNOSIS — G8929 Other chronic pain: Secondary | ICD-10-CM | POA: Diagnosis present

## 2023-08-24 DIAGNOSIS — Z79899 Other long term (current) drug therapy: Secondary | ICD-10-CM

## 2023-08-24 DIAGNOSIS — E66812 Obesity, class 2: Secondary | ICD-10-CM | POA: Diagnosis present

## 2023-08-24 DIAGNOSIS — K219 Gastro-esophageal reflux disease without esophagitis: Secondary | ICD-10-CM | POA: Diagnosis present

## 2023-08-24 DIAGNOSIS — I484 Atypical atrial flutter: Secondary | ICD-10-CM | POA: Diagnosis present

## 2023-08-24 DIAGNOSIS — Z1152 Encounter for screening for COVID-19: Secondary | ICD-10-CM

## 2023-08-24 DIAGNOSIS — Z87891 Personal history of nicotine dependence: Secondary | ICD-10-CM

## 2023-08-24 DIAGNOSIS — N1832 Chronic kidney disease, stage 3b: Secondary | ICD-10-CM | POA: Diagnosis present

## 2023-08-24 DIAGNOSIS — I4430 Unspecified atrioventricular block: Secondary | ICD-10-CM | POA: Diagnosis present

## 2023-08-24 DIAGNOSIS — Z7951 Long term (current) use of inhaled steroids: Secondary | ICD-10-CM

## 2023-08-24 DIAGNOSIS — G4733 Obstructive sleep apnea (adult) (pediatric): Secondary | ICD-10-CM | POA: Diagnosis present

## 2023-08-24 DIAGNOSIS — F449 Dissociative and conversion disorder, unspecified: Secondary | ICD-10-CM | POA: Diagnosis present

## 2023-08-24 DIAGNOSIS — J440 Chronic obstructive pulmonary disease with acute lower respiratory infection: Secondary | ICD-10-CM | POA: Diagnosis present

## 2023-08-24 DIAGNOSIS — I4891 Unspecified atrial fibrillation: Principal | ICD-10-CM | POA: Diagnosis present

## 2023-08-24 DIAGNOSIS — Z7902 Long term (current) use of antithrombotics/antiplatelets: Secondary | ICD-10-CM

## 2023-08-24 DIAGNOSIS — F319 Bipolar disorder, unspecified: Secondary | ICD-10-CM | POA: Diagnosis present

## 2023-08-24 DIAGNOSIS — Z87442 Personal history of urinary calculi: Secondary | ICD-10-CM

## 2023-08-24 DIAGNOSIS — I4819 Other persistent atrial fibrillation: Secondary | ICD-10-CM | POA: Diagnosis not present

## 2023-08-24 DIAGNOSIS — E7800 Pure hypercholesterolemia, unspecified: Secondary | ICD-10-CM | POA: Diagnosis present

## 2023-08-24 DIAGNOSIS — F419 Anxiety disorder, unspecified: Secondary | ICD-10-CM | POA: Diagnosis present

## 2023-08-24 DIAGNOSIS — Z885 Allergy status to narcotic agent status: Secondary | ICD-10-CM

## 2023-08-24 DIAGNOSIS — I2510 Atherosclerotic heart disease of native coronary artery without angina pectoris: Secondary | ICD-10-CM | POA: Diagnosis present

## 2023-08-24 DIAGNOSIS — R079 Chest pain, unspecified: Secondary | ICD-10-CM

## 2023-08-24 DIAGNOSIS — I5032 Chronic diastolic (congestive) heart failure: Secondary | ICD-10-CM | POA: Diagnosis present

## 2023-08-24 DIAGNOSIS — N179 Acute kidney failure, unspecified: Secondary | ICD-10-CM | POA: Diagnosis present

## 2023-08-24 DIAGNOSIS — Z794 Long term (current) use of insulin: Secondary | ICD-10-CM

## 2023-08-24 DIAGNOSIS — Z888 Allergy status to other drugs, medicaments and biological substances status: Secondary | ICD-10-CM

## 2023-08-24 DIAGNOSIS — N401 Enlarged prostate with lower urinary tract symptoms: Secondary | ICD-10-CM | POA: Diagnosis present

## 2023-08-24 DIAGNOSIS — N3941 Urge incontinence: Secondary | ICD-10-CM | POA: Diagnosis present

## 2023-08-24 DIAGNOSIS — Z7901 Long term (current) use of anticoagulants: Secondary | ICD-10-CM

## 2023-08-24 DIAGNOSIS — D631 Anemia in chronic kidney disease: Secondary | ICD-10-CM | POA: Diagnosis present

## 2023-08-24 DIAGNOSIS — E1122 Type 2 diabetes mellitus with diabetic chronic kidney disease: Secondary | ICD-10-CM | POA: Diagnosis present

## 2023-08-24 DIAGNOSIS — R002 Palpitations: Secondary | ICD-10-CM | POA: Diagnosis not present

## 2023-08-24 DIAGNOSIS — M1990 Unspecified osteoarthritis, unspecified site: Secondary | ICD-10-CM | POA: Diagnosis present

## 2023-08-24 LAB — URINALYSIS, W/ REFLEX TO CULTURE (INFECTION SUSPECTED)
Bilirubin Urine: NEGATIVE
Glucose, UA: >=500 mg/dL — AB
Hgb urine dipstick: NEGATIVE
Ketones, ur: NEGATIVE mg/dL
Nitrite: NEGATIVE
Protein, ur: NEGATIVE mg/dL
RBC / HPF: NONE SEEN RBC/hpf (ref 0–5)
Specific Gravity, Urine: 1.01 (ref 1.005–1.030)
pH: 6 (ref 5.0–8.0)

## 2023-08-24 LAB — CBC
HCT: 35.2 % — ABNORMAL LOW (ref 39.0–52.0)
Hemoglobin: 10.5 g/dL — ABNORMAL LOW (ref 13.0–17.0)
MCH: 23 pg — ABNORMAL LOW (ref 26.0–34.0)
MCHC: 29.8 g/dL — ABNORMAL LOW (ref 30.0–36.0)
MCV: 77.2 fL — ABNORMAL LOW (ref 80.0–100.0)
Platelets: 402 10*3/uL — ABNORMAL HIGH (ref 150–400)
RBC: 4.56 MIL/uL (ref 4.22–5.81)
RDW: 17.2 % — ABNORMAL HIGH (ref 11.5–15.5)
WBC: 9.5 10*3/uL (ref 4.0–10.5)
nRBC: 0 % (ref 0.0–0.2)

## 2023-08-24 LAB — HEPATIC FUNCTION PANEL
ALT: 29 U/L (ref 0–44)
AST: 25 U/L (ref 15–41)
Albumin: 3.4 g/dL — ABNORMAL LOW (ref 3.5–5.0)
Alkaline Phosphatase: 85 U/L (ref 38–126)
Bilirubin, Direct: <0.1 mg/dL (ref 0.0–0.2)
Total Bilirubin: 0.6 mg/dL (ref 0.0–1.2)
Total Protein: 7.4 g/dL (ref 6.5–8.1)

## 2023-08-24 LAB — RESP PANEL BY RT-PCR (RSV, FLU A&B, COVID)  RVPGX2
Influenza A by PCR: NEGATIVE
Influenza B by PCR: NEGATIVE
Resp Syncytial Virus by PCR: NEGATIVE
SARS Coronavirus 2 by RT PCR: NEGATIVE

## 2023-08-24 LAB — TSH: TSH: 1.377 u[IU]/mL (ref 0.350–4.500)

## 2023-08-24 LAB — MAGNESIUM: Magnesium: 2.2 mg/dL (ref 1.7–2.4)

## 2023-08-24 LAB — BASIC METABOLIC PANEL
Anion gap: 9 (ref 5–15)
BUN: 26 mg/dL — ABNORMAL HIGH (ref 8–23)
CO2: 28 mmol/L (ref 22–32)
Calcium: 8.6 mg/dL — ABNORMAL LOW (ref 8.9–10.3)
Chloride: 101 mmol/L (ref 98–111)
Creatinine, Ser: 2.15 mg/dL — ABNORMAL HIGH (ref 0.61–1.24)
GFR, Estimated: 32 mL/min — ABNORMAL LOW (ref >60)
Glucose, Bld: 135 mg/dL — ABNORMAL HIGH (ref 70–99)
Potassium: 3.4 mmol/L — ABNORMAL LOW (ref 3.5–5.1)
Sodium: 138 mmol/L (ref 135–145)

## 2023-08-24 LAB — TROPONIN I (HIGH SENSITIVITY)
Troponin I (High Sensitivity): 7 ng/L (ref <18)
Troponin I (High Sensitivity): 8 ng/L (ref <18)

## 2023-08-24 LAB — LIPASE, BLOOD: Lipase: 36 U/L (ref 11–51)

## 2023-08-24 LAB — BRAIN NATRIURETIC PEPTIDE: B Natriuretic Peptide: 177.1 pg/mL — ABNORMAL HIGH (ref 0.0–100.0)

## 2023-08-24 MED ORDER — SODIUM CHLORIDE 0.9 % IV BOLUS
500.0000 mL | Freq: Once | INTRAVENOUS | Status: AC
Start: 2023-08-24 — End: 2023-08-24
  Administered 2023-08-24: 500 mL via INTRAVENOUS

## 2023-08-24 MED ORDER — DILTIAZEM HCL-DEXTROSE 125-5 MG/125ML-% IV SOLN (PREMIX)
5.0000 mg/h | INTRAVENOUS | Status: DC
  Administered 2023-08-24: 5 mg/h via INTRAVENOUS
  Administered 2023-08-25 – 2023-08-26 (×4): 15 mg/h via INTRAVENOUS
  Filled 2023-08-24 (×7): qty 125

## 2023-08-24 MED ORDER — SODIUM CHLORIDE 0.9 % IV SOLN
1.0000 g | Freq: Once | INTRAVENOUS | Status: AC
  Administered 2023-08-24: 1 g via INTRAVENOUS
  Filled 2023-08-24: qty 10

## 2023-08-24 MED ORDER — SODIUM CHLORIDE 0.9 % IV SOLN
INTRAVENOUS | Status: AC | PRN
  Administered 2023-08-24: 250 mL via INTRAVENOUS

## 2023-08-24 MED ORDER — IOHEXOL 350 MG/ML SOLN
75.0000 mL | Freq: Once | INTRAVENOUS | Status: AC | PRN
  Administered 2023-08-24: 75 mL via INTRAVENOUS

## 2023-08-24 MED ORDER — FENTANYL CITRATE PF 50 MCG/ML IJ SOSY
50.0000 ug | PREFILLED_SYRINGE | Freq: Once | INTRAMUSCULAR | Status: AC
  Administered 2023-08-24: 50 ug via INTRAVENOUS
  Filled 2023-08-24: qty 1

## 2023-08-24 MED ORDER — SODIUM CHLORIDE 0.9 % IV SOLN
500.0000 mg | Freq: Once | INTRAVENOUS | Status: AC
  Administered 2023-08-24: 500 mg via INTRAVENOUS
  Filled 2023-08-24: qty 5

## 2023-08-24 NOTE — ED Provider Notes (Signed)
 [place] [facility] Provider Note   CSN: [id number] Arrival date & time: 08/24/23  1401     History  Chief Complaint  Patient presents with   Palpitations    Mike Hunter is a 73 y.o. male.  The history is provided by the patient, the spouse and medical records. No language interpreter was used.  Palpitations Palpitations quality:  Fast Onset quality:  Gradual Duration:  2 weeks Timing:  Constant Progression:  Waxing and waning Chronicity:  New Relieved by:  Nothing Worsened by:  Nothing Ineffective treatments:  None tried Associated symptoms: chest pain, chest pressure, cough, diaphoresis, malaise/fatigue, near-syncope and shortness of breath   Associated symptoms: no back pain, no hemoptysis, no leg pain, no lower extremity edema, no nausea, no numbness and no vomiting   Risk factors: diabetes mellitus, heart disease and hx of atrial fibrillation        Home Medications Prior to Admission medications   Medication Sig Start Date End Date Taking? Authorizing Provider  albuterol (PROVENTIL HFA;VENTOLIN HFA) 108 (90 BASE) MCG/ACT inhaler Inhale 2 puffs into the lungs every 4 (four) hours as needed for wheezing.    [provider]  amiodarone (PACERONE) 200 MG tablet Take 200 mg by mouth 2 (two) times daily. 04/22/22   [provider]  atorvastatin (LIPITOR) 40 MG tablet Take 40 mg by mouth every evening.     [provider]  budesonide (PULMICORT) 0.5 MG/2ML nebulizer solution Take 2 mLs by nebulization 2 (two) times daily.    [provider]  budesonide-formoterol (SYMBICORT) 160-4.5 MCG/ACT inhaler Inhale 2 puffs into the lungs 2 (two) times daily. 07/03/16   [provider]  carvedilol (COREG) 6.25 MG tablet Take 12.5 mg by mouth 2 (two) times daily with a meal.     [provider]  clopidogrel (PLAVIX) 75 MG tablet Take 1 tablet by mouth daily. 05/18/23 05/17/24  [provider]  dextromethorphan-guaiFENesin (MUCINEX DM) 30-600 MG 12hr tablet Take 1 tablet by mouth 2 (two) times daily. 06/10/23   Glade Lloyd, MD  donepezil (ARICEPT) 10 MG tablet Take 10 mg by mouth at bedtime.    [provider]  eszopiclone (LUNESTA) 2 MG TABS tablet Take 2 mg by mouth at bedtime as needed. 04/23/23   [provider]  insulin aspart (NOVOLOG FLEXPEN) 100 UNIT/ML injection Inject 26-44 Units into the skin See admin instructions. 5 units prior to meals for cbg>= 150  Sliding scale (base is 26u in the AM, 30u at noon, and  36-44 at night    [provider]  Insulin Degludec (TRESIBA FLEXTOUCH ) Inject 40-50 Units into the skin See admin instructions. 40 morning, 50 night    [provider]  isosorbide mononitrate (IMDUR) 30 MG 24 hr tablet Take 1 tablet by mouth daily. 01/22/22   [provider]  latanoprost (XALATAN) 0.005 % ophthalmic solution Place 1 drop into both eyes at bedtime.      [provider]  memantine (NAMENDA) 5 MG tablet Take 5 mg by mouth 2 (two) times daily.    [provider]  mirabegron ER (MYRBETRIQ) 50 MG TB24 tablet Take 1 tablet by mouth daily. 12/05/22 11/30/23  [provider]  montelukast (SINGULAIR) 10 MG tablet Take 10 mg by mouth at bedtime.    [provider]  omeprazole (PRILOSEC) 40 MG capsule Take 1 capsule by mouth at bedtime. 02/02/17   [provider]  oxybutynin (DITROPAN-XL) 10  MG 24 hr tablet Take 10 mg by mouth daily.    [provider]  sertraline (ZOLOFT) 50 MG tablet Take 50 mg by mouth every morning.     [provider]  sodium bicarbonate 650 MG tablet Take 1 tablet (650 mg total) by mouth 3 (three) times daily. 06/10/23   Glade Lloyd, MD  spironolactone (ALDACTONE) 25 MG tablet Take 25 mg by mouth daily.    [provider]  torsemide (DEMADEX) 20 MG tablet Take 40 mg by mouth 2 (two) times daily.    [provider]  Monte Fantasia INHUB 250-50 MCG/ACT AEPB Inhale 1 puff into the lungs in the morning and at bedtime. 04/20/23   [provider]  XARELTO 20 MG TABS tablet Take 20 mg by mouth daily with supper. 04/04/22   [provider]      Allergies    Buprenorphine hcl, Hydromorphone, Codeine, Mirtazapine, and Morphine and codeine    Review of Systems   Review of Systems  Constitutional:  Positive for chills, diaphoresis, fatigue and malaise/fatigue. Negative for fever.  HENT:  Positive for congestion.   Respiratory:  Positive for cough, chest tightness and shortness of breath. Negative for hemoptysis and wheezing.   Cardiovascular:  Positive for chest pain, palpitations and near-syncope. Negative for leg swelling.  Gastrointestinal:  Positive for constipation and diarrhea. Negative for abdominal pain, nausea and vomiting.  Genitourinary:  Positive for dysuria. Negative for flank pain and frequency.  Musculoskeletal:  Negative for back pain, neck pain and neck stiffness.  Skin:  Negative for rash and wound.  Neurological:  Positive for light-headedness. Negative for numbness and headaches.  Psychiatric/Behavioral:  Negative for agitation.   All other systems reviewed and are negative.   Physical Exam Updated Vital Signs BP 122/82 (BP Location: Right Arm)   Pulse (!) 125   Temp 98.5 F (36.9 C) (Oral)   Resp (!) 28   Ht 5\' 11"  (1.803 m)   Wt 117.9 kg   SpO2 100%   BMI 36.26 kg/m  Physical Exam Vitals and nursing note reviewed.  Constitutional:      General: He is not in acute distress.    Appearance: He is well-developed. He is not ill-appearing or toxic-appearing.  HENT:     Head: Normocephalic and atraumatic.     Nose: No congestion or rhinorrhea.     Mouth/Throat:     Mouth: Mucous membranes are moist.     Pharynx: No oropharyngeal exudate or posterior oropharyngeal erythema.  Eyes:     Extraocular Movements: Extraocular movements intact.      Conjunctiva/sclera: Conjunctivae normal.     Pupils: Pupils are equal, round, and reactive to light.  Cardiovascular:     Rate and Rhythm: Tachycardia present. Rhythm irregular.     Heart sounds: No murmur heard. Pulmonary:     Effort: Pulmonary effort is normal. No respiratory distress.     Breath sounds: Rhonchi present. No wheezing or rales.  Chest:     Chest wall: Tenderness present.  Abdominal:     General: There is no distension.     Palpations: Abdomen is soft.     Tenderness: There is no abdominal tenderness.  Musculoskeletal:        General: Tenderness present. No swelling.     Cervical back: Neck supple. No tenderness.     Right lower leg: No edema.     Left lower leg: No edema.  Skin:    General: Skin is warm and  dry.     Capillary Refill: Capillary refill takes less than 2 seconds.     Findings: No erythema or rash.  Neurological:     General: No focal deficit present.     Mental Status: He is alert.  Psychiatric:        Mood and Affect: Mood normal.     ED Results / Procedures / Treatments   Labs (all labs ordered are listed, but only abnormal results are displayed) Labs Reviewed  BASIC METABOLIC PANEL - Abnormal; Notable for the following components:      Result Value   Potassium 3.4 (*)    Glucose, Bld 135 (*)    BUN 26 (*)    Creatinine, Ser 2.15 (*)    Calcium 8.6 (*)    GFR, Estimated 32 (*)    All other components within normal limits  CBC - Abnormal; Notable for the following components:   Hemoglobin 10.5 (*)    HCT 35.2 (*)    MCV 77.2 (*)    MCH 23.0 (*)    MCHC 29.8 (*)    RDW 17.2 (*)    Platelets 402 (*)    All other components within normal limits  HEPATIC FUNCTION PANEL - Abnormal; Notable for the following components:   Albumin 3.4 (*)    All other components within normal limits  BRAIN NATRIURETIC PEPTIDE - Abnormal; Notable for the following components:   B Natriuretic Peptide 177.1 (*)    All other components within normal  limits  URINALYSIS, W/ REFLEX TO CULTURE (INFECTION SUSPECTED) - Abnormal; Notable for the following components:   Glucose, UA >=500 (*)    Leukocytes,Ua TRACE (*)    Bacteria, UA RARE (*)    All other components within normal limits  RESP PANEL BY RT-PCR (RSV, FLU A&B, COVID)  RVPGX2  LIPASE, BLOOD  MAGNESIUM  TSH  TROPONIN I (HIGH SENSITIVITY)  TROPONIN I (HIGH SENSITIVITY)    EKG EKG Interpretation Date/Time:  Monday August 24 2023 14:16:47 EDT Ventricular Rate:  114 PR Interval:    QRS Duration:  106 QT Interval:  424 QTC Calculation: 584 R Axis:   108  Text Interpretation: * Critical Test Result: Long QTc Atrial flutter with variable A-V block Rightward axis Incomplete right bundle branch block Nonspecific ST abnormality Prolonged QT Abnormal ECG When compared with ECG of 06-Jun-2023 18:25, PREVIOUS ECG IS PRESENT when compared to prior, now afib with faster rate, longer QTc. No STEMI Confirmed by Theda Belfast ([id number]) on 08/24/2023 3:11:16 PM  Radiology CT Angio Chest PE W and/or Wo Contrast Result Date: 08/24/2023 CLINICAL DATA:  Pulmonary embolism (PE) suspected, high prob Chest pain, shortness of breath and palpitations. EXAM: CT ANGIOGRAPHY CHEST WITH CONTRAST TECHNIQUE: Multidetector CT imaging of the chest was performed using the standard protocol during bolus administration of intravenous contrast. Multiplanar CT image reconstructions and MIPs were obtained to evaluate the vascular anatomy. RADIATION DOSE REDUCTION: This exam was performed according to the departmental dose-optimization program which includes automated exposure control, adjustment of the mA and/or kV according to patient size and/or use of iterative reconstruction technique. CONTRAST:  75mL OMNIPAQUE IOHEXOL 350 MG/ML SOLN COMPARISON:  Radiograph earlier today, chest CT 01/30/2023 FINDINGS: Cardiovascular: There are no filling defects within the pulmonary arteries to suggest pulmonary embolus. The heart is  upper normal in size. Trace pericardial fluid adjacent to the left ventricle. Coronary artery calcifications. Mild aortic atherosclerosis. No aortic aneurysm. 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 Mediastinum/Nodes: No  enlarged mediastinal or hilar lymph nodes. Tiny hiatal hernia. Otherwise unremarkable esophagus. No thyroid nodule. Lungs/Pleura: Scattered areas of ground-glass opacity in both lower lobes, with faint tree-in-bud opacity in the left upper lobe. No pleural fluid. The trachea and central airways are clear. Upper Abdomen: No acute findings. Left renal cyst. No further follow-up imaging is recommended. Musculoskeletal: T10 compression fracture has progressed from 06/24/2023 thoracic spine CT with increased loss of height superiorly and decreased density in the left aspect of the central vertebral body. Mild inferior T8 compression fracture is unchanged. Mild superior T3 compression fracture is unchanged. Review of the MIP images confirms the above findings. IMPRESSION: 1. No pulmonary embolus. 2. Scattered areas of ground-glass opacity in both lower lobes, with faint tree-in-bud opacity in the left upper lobe. Findings are suspicious for pneumonitis, including atypical infection. 3. T10 compression fracture has progressed from 06/24/2023 thoracic spine CT with increased loss of height. Aortic Atherosclerosis (ICD10-I70.0). Electronically Signed   By: Narda Rutherford M.D.   On: 08/24/2023 19:56   DG Chest 2 View Result Date: 08/24/2023 CLINICAL DATA:  Chest pain. Shortness of breath. Palpitations for 2-3 weeks. EXAM: CHEST - 2 VIEW COMPARISON:  Chest radiographs 06/24/2023, CT chest, abdomen, and pelvis 01/30/2023 FINDINGS: Cardiac silhouette and mediastinal contours are within normal limits. Moderate calcification within the aortic arch. Flattening of the diaphragms and mild hyperinflation. Flattening of the diaphragms and mild hyperinflation. Mild multilevel  degenerative disc changes of the thoracic spine, unchanged. Mild anterior wedging of the T7 vertebral body is unchanged from prior radiographs and CT. IMPRESSION: 1. No active cardiopulmonary disease. 2. Mild hyperinflation. Electronically Signed   By: Neita Garnet M.D.   On: 08/24/2023 18:01    Procedures Procedures    CRITICAL CARE Performed by: Canary Brim Dorthea Maina Total critical care time: 35 minutes Critical care time was exclusive of separately billable procedures and treating other patients. Critical care was necessary to treat or prevent imminent or life-threatening deterioration. Critical care was time spent personally by me on the following activities: development of treatment plan with patient and/or surrogate as well as nursing, discussions with consultants, evaluation of patient's response to treatment, examination of patient, obtaining history from patient or surrogate, ordering and performing treatments and interventions, ordering and review of laboratory studies, ordering and review of radiographic studies, pulse oximetry and re-evaluation of patient's condition.  Medications Ordered in ED Medications  diltiazem (CARDIZEM) 125 mg in dextrose 5% 125 mL (1 mg/mL) infusion (15 mg/hr Intravenous Rate/Dose Change 08/24/23 1906)  cefTRIAXone (ROCEPHIN) 1 g in sodium chloride 0.9 % 100 mL IVPB (has no administration in time range)  azithromycin (ZITHROMAX) 500 mg in sodium chloride 0.9 % 250 mL IVPB (has no administration in time range)  0.9 %  sodium chloride infusion (has no administration in time range)  sodium chloride 0.9 % bolus 500 mL (0 mLs Intravenous Stopped 08/24/23 1902)  fentaNYL (SUBLIMAZE) injection 50 mcg (50 mcg Intravenous Given 08/24/23 1602)  iohexol (OMNIPAQUE) 350 MG/ML injection 75 mL (75 mLs Intravenous Contrast Given 08/24/23 1647)    ED Course/ Medical Decision Making/ A&P                                 Medical Decision Making Amount and/or Complexity  of Data Reviewed Labs: ordered. Radiology: ordered.  Risk Prescription drug management. Decision regarding hospitalization.    Mike Hunter is a 73 y.o. male with a complex past  medical history including CAD, CHF, diabetes, COPD, asthma, reflux, kidney stones, gallstones, chronic atrial fibrillation on Xarelto, recent carotid stenting on Plavix, and recent pneumonia and spinal fractures using a brace who presents with 2 weeks of worsening fatigue, chills, productive cough, pleuritic chest pain, shortness of breath, palpitations, tachycardia, and lightheadedness.  According to patient, for last 2 weeks or so he has had these worsening symptoms with significant chest pain in his left chest.  Is up to 9 out of 10 in severity.  He reports it as an "heavy gorilla pushing him and stabbing him in his left chest.".  He cannot take a deep breath and is very pleuritic.  It is also exertional.  He reports feeling very winded and cannot breathe.  He has tried his breathing machine at home at night and does not seem to help at times.  He reports no abdominal pain and denies any nausea or vomiting.  Reports some intermittent constipation and diarrhea but denies any leg pain or leg swelling.  He is concerned because multiple family was have had blood clots in his family.  He does report the admission about a month ago for pneumonia and before that had his carotid surgery.  He also has several spine fracture that he needed a brace for with less mobility.  He denies any new trauma.  He also reported some dysuria.  he denies any other complaints at this time.  He said that his heart rate has been going up to 150 over the last day or 2 when he checks it at home and he has the significant palpitations.  On exam, lungs were slightly coarse on exam.  I not appreciate wheezing.  He does have a murmur.  Chest was tender to palpation.  Abdomen nontender with good bowel sounds.  Back and flanks nontender initially.  Legs are  nontender and nonedematous.  He had good pulses in upper extremities.  Patient resting but heart rate is going between 120 and 150.  Does appear to show an A-fib/flutter pattern.  No STEMI.  Will start on diltiazem to slow him down.  With his recent injury, pneumonia, and carotid surgery, and his family history of thromboembolic disease, I do feel need to rule out pulmonary embolism given his discomfort and symptoms.  Will get a CT PE study and will also get delta troponin, BNP, and will get other labs.  Will give him some pain medicine as well.  Will check for COVID/flu/RSV given his cough.  As his legs are nonedematous and he is tachycardic, will consider small mount of fluids to see if this helps his heart rate as well.  Will also help his kidney function as he does appear to have an AKI on initial labs here.  Will add on hepatic function and lipase given the pleuritic left-sided discomfort as he could be having a pancreatitis causing diaphragm type pain that is pleuritic.  Anticipate admission after workup.   On the diltiazem drip, heart rate has improved.  Heart rate is around 100 now.  Blood pressure is on the softer side earlier but now has improved to around 120 systolic.  Workup shows negative for COVID/flu/RSV.  Troponin normal.  BNP is elevated at 177 but slightly improved from prior.  Urinalysis does show leukocytes and bacteria but no nitrates.  He did have some dysuria but his CT scan did show evidence of likely pneumonia/pneumonitis.  Will give antibiotics to cover both UTI and pneumonia.  He does have mild  anemia but no leukocytosis.  Creatinine is rising compared to prior.  I spoke to cardiology and they agree with medicine admission to help with diuresis and monitor him.  Medicine will admit for further management of A-fib with RVR symptomatic with chest pain shortness of breath as well as possible UTI and pneumonia.         Final Clinical Impression(s) / ED Diagnoses Final  diagnoses:  Atrial fibrillation with rapid ventricular response (HCC)  Chest pain, unspecified type  Pneumonia due to infectious organism, unspecified laterality, unspecified part of lung  Acute cystitis without hematuria    Clinical Impression: 1. Atrial fibrillation with rapid ventricular response (HCC)   2. Chest pain, unspecified type   3. Pneumonia due to infectious organism, unspecified laterality, unspecified part of lung   4. Acute cystitis without hematuria     Disposition: Admit  This note was prepared with assistance of Dragon voice recognition software. Occasional wrong-word or sound-a-like substitutions may have occurred due to the inherent limitations of voice recognition software.     Tonyia Marschall, Canary Brim, MD 08/24/23 2035

## 2023-08-24 NOTE — ED Triage Notes (Signed)
 C/o palpitations for 2-3 weeks. Hx of A.fib. Taking Plavix and Xarelto. Also c/o chest pain, SOB and left arm pain.

## 2023-08-24 NOTE — ED Notes (Signed)
 EDP approval to disconnect diltiazem for CT.

## 2023-08-24 NOTE — ED Notes (Signed)
 ED Provider at bedside.

## 2023-08-24 NOTE — ED Notes (Signed)
 Pt returned from CT w primary nurse and CT tech

## 2023-08-25 ENCOUNTER — Other Ambulatory Visit: Payer: Self-pay

## 2023-08-25 DIAGNOSIS — E66812 Obesity, class 2: Secondary | ICD-10-CM | POA: Diagnosis present

## 2023-08-25 DIAGNOSIS — I2510 Atherosclerotic heart disease of native coronary artery without angina pectoris: Secondary | ICD-10-CM | POA: Diagnosis present

## 2023-08-25 DIAGNOSIS — G8929 Other chronic pain: Secondary | ICD-10-CM | POA: Diagnosis present

## 2023-08-25 DIAGNOSIS — Z1152 Encounter for screening for COVID-19: Secondary | ICD-10-CM | POA: Diagnosis not present

## 2023-08-25 DIAGNOSIS — I25119 Atherosclerotic heart disease of native coronary artery with unspecified angina pectoris: Secondary | ICD-10-CM | POA: Diagnosis not present

## 2023-08-25 DIAGNOSIS — Z79899 Other long term (current) drug therapy: Secondary | ICD-10-CM | POA: Diagnosis not present

## 2023-08-25 DIAGNOSIS — I484 Atypical atrial flutter: Secondary | ICD-10-CM | POA: Diagnosis present

## 2023-08-25 DIAGNOSIS — R002 Palpitations: Secondary | ICD-10-CM | POA: Diagnosis present

## 2023-08-25 DIAGNOSIS — Z794 Long term (current) use of insulin: Secondary | ICD-10-CM | POA: Diagnosis not present

## 2023-08-25 DIAGNOSIS — E1122 Type 2 diabetes mellitus with diabetic chronic kidney disease: Secondary | ICD-10-CM | POA: Diagnosis present

## 2023-08-25 DIAGNOSIS — N179 Acute kidney failure, unspecified: Secondary | ICD-10-CM

## 2023-08-25 DIAGNOSIS — I130 Hypertensive heart and chronic kidney disease with heart failure and stage 1 through stage 4 chronic kidney disease, or unspecified chronic kidney disease: Secondary | ICD-10-CM | POA: Diagnosis present

## 2023-08-25 DIAGNOSIS — I4891 Unspecified atrial fibrillation: Secondary | ICD-10-CM

## 2023-08-25 DIAGNOSIS — K219 Gastro-esophageal reflux disease without esophagitis: Secondary | ICD-10-CM | POA: Diagnosis present

## 2023-08-25 DIAGNOSIS — E7800 Pure hypercholesterolemia, unspecified: Secondary | ICD-10-CM | POA: Diagnosis present

## 2023-08-25 DIAGNOSIS — E876 Hypokalemia: Secondary | ICD-10-CM | POA: Diagnosis present

## 2023-08-25 DIAGNOSIS — I350 Nonrheumatic aortic (valve) stenosis: Secondary | ICD-10-CM | POA: Diagnosis present

## 2023-08-25 DIAGNOSIS — D631 Anemia in chronic kidney disease: Secondary | ICD-10-CM | POA: Diagnosis present

## 2023-08-25 DIAGNOSIS — N1832 Chronic kidney disease, stage 3b: Secondary | ICD-10-CM | POA: Diagnosis present

## 2023-08-25 DIAGNOSIS — I4819 Other persistent atrial fibrillation: Secondary | ICD-10-CM | POA: Diagnosis present

## 2023-08-25 DIAGNOSIS — J189 Pneumonia, unspecified organism: Secondary | ICD-10-CM | POA: Diagnosis present

## 2023-08-25 DIAGNOSIS — J449 Chronic obstructive pulmonary disease, unspecified: Secondary | ICD-10-CM | POA: Diagnosis not present

## 2023-08-25 DIAGNOSIS — Z87891 Personal history of nicotine dependence: Secondary | ICD-10-CM | POA: Diagnosis not present

## 2023-08-25 DIAGNOSIS — F319 Bipolar disorder, unspecified: Secondary | ICD-10-CM | POA: Diagnosis present

## 2023-08-25 DIAGNOSIS — Z7951 Long term (current) use of inhaled steroids: Secondary | ICD-10-CM | POA: Diagnosis not present

## 2023-08-25 DIAGNOSIS — E119 Type 2 diabetes mellitus without complications: Secondary | ICD-10-CM | POA: Diagnosis not present

## 2023-08-25 DIAGNOSIS — R072 Precordial pain: Secondary | ICD-10-CM | POA: Diagnosis not present

## 2023-08-25 DIAGNOSIS — J440 Chronic obstructive pulmonary disease with acute lower respiratory infection: Secondary | ICD-10-CM | POA: Diagnosis present

## 2023-08-25 DIAGNOSIS — I5032 Chronic diastolic (congestive) heart failure: Secondary | ICD-10-CM | POA: Diagnosis present

## 2023-08-25 DIAGNOSIS — Z7901 Long term (current) use of anticoagulants: Secondary | ICD-10-CM | POA: Diagnosis not present

## 2023-08-25 LAB — CBC WITH DIFFERENTIAL/PLATELET
Abs Immature Granulocytes: 0.04 10*3/uL (ref 0.00–0.07)
Basophils Absolute: 0.1 10*3/uL (ref 0.0–0.1)
Basophils Relative: 1 %
Eosinophils Absolute: 0.2 10*3/uL (ref 0.0–0.5)
Eosinophils Relative: 1 %
HCT: 35.7 % — ABNORMAL LOW (ref 39.0–52.0)
Hemoglobin: 10.8 g/dL — ABNORMAL LOW (ref 13.0–17.0)
Immature Granulocytes: 0 %
Lymphocytes Relative: 10 %
Lymphs Abs: 1.1 10*3/uL (ref 0.7–4.0)
MCH: 23.2 pg — ABNORMAL LOW (ref 26.0–34.0)
MCHC: 30.3 g/dL (ref 30.0–36.0)
MCV: 76.8 fL — ABNORMAL LOW (ref 80.0–100.0)
Monocytes Absolute: 0.8 10*3/uL (ref 0.1–1.0)
Monocytes Relative: 7 %
Neutro Abs: 9.1 10*3/uL — ABNORMAL HIGH (ref 1.7–7.7)
Neutrophils Relative %: 81 %
Platelets: 376 10*3/uL (ref 150–400)
RBC: 4.65 MIL/uL (ref 4.22–5.81)
RDW: 17.2 % — ABNORMAL HIGH (ref 11.5–15.5)
WBC: 11.2 10*3/uL — ABNORMAL HIGH (ref 4.0–10.5)
nRBC: 0 % (ref 0.0–0.2)

## 2023-08-25 LAB — COMPREHENSIVE METABOLIC PANEL
ALT: 32 U/L (ref 0–44)
AST: 26 U/L (ref 15–41)
Albumin: 3.1 g/dL — ABNORMAL LOW (ref 3.5–5.0)
Alkaline Phosphatase: 79 U/L (ref 38–126)
Anion gap: 12 (ref 5–15)
BUN: 17 mg/dL (ref 8–23)
CO2: 23 mmol/L (ref 22–32)
Calcium: 8.7 mg/dL — ABNORMAL LOW (ref 8.9–10.3)
Chloride: 104 mmol/L (ref 98–111)
Creatinine, Ser: 1.55 mg/dL — ABNORMAL HIGH (ref 0.61–1.24)
GFR, Estimated: 47 mL/min — ABNORMAL LOW (ref >60)
Glucose, Bld: 225 mg/dL — ABNORMAL HIGH (ref 70–99)
Potassium: 3.4 mmol/L — ABNORMAL LOW (ref 3.5–5.1)
Sodium: 139 mmol/L (ref 135–145)
Total Bilirubin: 0.5 mg/dL (ref 0.0–1.2)
Total Protein: 7 g/dL (ref 6.5–8.1)

## 2023-08-25 LAB — HEMOGLOBIN A1C
Hgb A1c MFr Bld: 7.3 % — ABNORMAL HIGH (ref 4.8–5.6)
Mean Plasma Glucose: 162.81 mg/dL

## 2023-08-25 LAB — MAGNESIUM: Magnesium: 2.3 mg/dL (ref 1.7–2.4)

## 2023-08-25 LAB — CBG MONITORING, ED
Glucose-Capillary: 93 mg/dL (ref 70–99)
Glucose-Capillary: 134 mg/dL — ABNORMAL HIGH (ref 70–99)

## 2023-08-25 LAB — GLUCOSE, CAPILLARY
Glucose-Capillary: 212 mg/dL — ABNORMAL HIGH (ref 70–99)
Glucose-Capillary: 188 mg/dL — ABNORMAL HIGH (ref 70–99)

## 2023-08-25 LAB — TSH: TSH: 0.891 u[IU]/mL (ref 0.350–4.500)

## 2023-08-25 LAB — T4, FREE: Free T4: 1.08 ng/dL (ref 0.61–1.12)

## 2023-08-25 LAB — PHOSPHORUS: Phosphorus: 3.1 mg/dL (ref 2.5–4.6)

## 2023-08-25 MED ORDER — ATORVASTATIN CALCIUM 80 MG PO TABS
80.0000 mg | ORAL_TABLET | Freq: Every evening | ORAL | Status: DC
  Administered 2023-08-25: 80 mg via ORAL
  Filled 2023-08-25: qty 1

## 2023-08-25 MED ORDER — AMIODARONE HCL 200 MG PO TABS
200.0000 mg | ORAL_TABLET | Freq: Every day | ORAL | Status: DC
  Administered 2023-08-25: 200 mg via ORAL
  Filled 2023-08-25: qty 1

## 2023-08-25 MED ORDER — INSULIN GLARGINE 100 UNIT/ML ~~LOC~~ SOLN
40.0000 [IU] | Freq: Every day | SUBCUTANEOUS | Status: DC
  Administered 2023-08-26: 40 [IU] via SUBCUTANEOUS
  Filled 2023-08-25: qty 0.4

## 2023-08-25 MED ORDER — CLOPIDOGREL BISULFATE 75 MG PO TABS
75.0000 mg | ORAL_TABLET | Freq: Every day | ORAL | Status: DC
  Administered 2023-08-25 – 2023-08-26 (×2): 75 mg via ORAL
  Filled 2023-08-25 (×3): qty 1

## 2023-08-25 MED ORDER — NOVOLOG FLEXPEN 100 UNIT/ML ~~LOC~~ SOLN: 26.0000 [IU] | SUBCUTANEOUS | Status: DC

## 2023-08-25 MED ORDER — ISOSORBIDE MONONITRATE ER 30 MG PO TB24
30.0000 mg | ORAL_TABLET | Freq: Every day | ORAL | Status: DC
  Administered 2023-08-26: 30 mg via ORAL
  Filled 2023-08-25: qty 1

## 2023-08-25 MED ORDER — AMIODARONE HCL 200 MG PO TABS: 400.0000 mg | ORAL_TABLET | Freq: Once | ORAL | Status: DC

## 2023-08-25 MED ORDER — SPIRONOLACTONE 25 MG PO TABS
25.0000 mg | ORAL_TABLET | Freq: Every day | ORAL | Status: DC
Start: 2023-08-25 — End: 2023-08-26
  Administered 2023-08-25 – 2023-08-26 (×2): 25 mg via ORAL
  Filled 2023-08-25: qty 2
  Filled 2023-08-25: qty 1

## 2023-08-25 MED ORDER — CARVEDILOL 12.5 MG PO TABS: 12.5000 mg | ORAL_TABLET | Freq: Two times a day (BID) | ORAL | Status: DC

## 2023-08-25 MED ORDER — SODIUM CHLORIDE 0.9% FLUSH: 3.0000 mL | INTRAVENOUS | Status: DC | PRN

## 2023-08-25 MED ORDER — TORSEMIDE 20 MG PO TABS
40.0000 mg | ORAL_TABLET | Freq: Two times a day (BID) | ORAL | Status: DC
  Administered 2023-08-25 – 2023-08-26 (×2): 40 mg via ORAL
  Filled 2023-08-25 (×2): qty 2

## 2023-08-25 MED ORDER — MONTELUKAST SODIUM 10 MG PO TABS
10.0000 mg | ORAL_TABLET | Freq: Every day | ORAL | Status: DC
  Administered 2023-08-25: 10 mg via ORAL
  Filled 2023-08-25: qty 1

## 2023-08-25 MED ORDER — ATORVASTATIN CALCIUM 40 MG PO TABS: 40.0000 mg | ORAL_TABLET | Freq: Every evening | ORAL | Status: DC

## 2023-08-25 MED ORDER — PANTOPRAZOLE SODIUM 40 MG PO TBEC
40.0000 mg | DELAYED_RELEASE_TABLET | Freq: Every day | ORAL | Status: DC
  Administered 2023-08-25 – 2023-08-26 (×2): 40 mg via ORAL
  Filled 2023-08-25 (×2): qty 1

## 2023-08-25 MED ORDER — SERTRALINE HCL 25 MG PO TABS: 50.0000 mg | ORAL_TABLET | ORAL | Status: DC

## 2023-08-25 MED ORDER — METOPROLOL TARTRATE 25 MG PO TABS
25.0000 mg | ORAL_TABLET | Freq: Two times a day (BID) | ORAL | Status: DC
  Administered 2023-08-25 – 2023-08-26 (×2): 25 mg via ORAL
  Filled 2023-08-25 (×2): qty 1

## 2023-08-25 MED ORDER — AMIODARONE HCL 200 MG PO TABS: 200.0000 mg | ORAL_TABLET | Freq: Two times a day (BID) | ORAL | Status: DC

## 2023-08-25 MED ORDER — MOMETASONE FURO-FORMOTEROL FUM 200-5 MCG/ACT IN AERO
2.0000 | INHALATION_SPRAY | Freq: Two times a day (BID) | RESPIRATORY_TRACT | Status: DC
  Administered 2023-08-26: 2 via RESPIRATORY_TRACT
  Filled 2023-08-25: qty 8.8

## 2023-08-25 MED ORDER — INSULIN GLARGINE 100 UNIT/ML ~~LOC~~ SOLN
50.0000 [IU] | Freq: Every day | SUBCUTANEOUS | Status: DC
  Administered 2023-08-25: 50 [IU] via SUBCUTANEOUS
  Filled 2023-08-25 (×2): qty 0.5

## 2023-08-25 MED ORDER — INSULIN ASPART 100 UNIT/ML IJ SOLN: 0.0000 [IU] | Freq: Every day | INTRAMUSCULAR | Status: DC

## 2023-08-25 MED ORDER — SODIUM CHLORIDE 0.9 % IV SOLN
1.0000 g | INTRAVENOUS | Status: DC
  Administered 2023-08-25: 1 g via INTRAVENOUS
  Filled 2023-08-25: qty 10

## 2023-08-25 MED ORDER — INSULIN DEGLUDEC 100 UNIT/ML ~~LOC~~ SOPN: 40.0000 [IU] | PEN_INJECTOR | SUBCUTANEOUS | Status: DC

## 2023-08-25 MED ORDER — MEMANTINE HCL 5 MG PO TABS
5.0000 mg | ORAL_TABLET | Freq: Two times a day (BID) | ORAL | Status: DC
Start: 2023-08-25 — End: 2023-08-26
  Administered 2023-08-25 – 2023-08-26 (×2): 5 mg via ORAL
  Filled 2023-08-25 (×3): qty 1

## 2023-08-25 MED ORDER — AMIODARONE HCL 200 MG PO TABS: 200.0000 mg | ORAL_TABLET | Freq: Every day | ORAL | Status: DC

## 2023-08-25 MED ORDER — AMIODARONE HCL 200 MG PO TABS
400.0000 mg | ORAL_TABLET | Freq: Two times a day (BID) | ORAL | Status: DC
  Administered 2023-08-25: 400 mg via ORAL
  Filled 2023-08-25 (×2): qty 2

## 2023-08-25 MED ORDER — SODIUM CHLORIDE 0.9% FLUSH
3.0000 mL | Freq: Two times a day (BID) | INTRAVENOUS | Status: DC
  Administered 2023-08-25: 3 mL via INTRAVENOUS

## 2023-08-25 MED ORDER — SODIUM BICARBONATE 650 MG PO TABS: 650.0000 mg | ORAL_TABLET | Freq: Three times a day (TID) | ORAL | Status: DC

## 2023-08-25 MED ORDER — RIVAROXABAN 20 MG PO TABS: 20.0000 mg | ORAL_TABLET | Freq: Every day | ORAL | Status: DC

## 2023-08-25 MED ORDER — SERTRALINE HCL 100 MG PO TABS
100.0000 mg | ORAL_TABLET | Freq: Every day | ORAL | Status: DC
  Administered 2023-08-25 – 2023-08-26 (×2): 100 mg via ORAL
  Filled 2023-08-25: qty 4
  Filled 2023-08-25: qty 1

## 2023-08-25 MED ORDER — ACETAMINOPHEN 325 MG PO TABS
650.0000 mg | ORAL_TABLET | Freq: Once | ORAL | Status: AC
  Administered 2023-08-25: 650 mg via ORAL
  Filled 2023-08-25: qty 2

## 2023-08-25 MED ORDER — OXYBUTYNIN CHLORIDE ER 10 MG PO TB24
10.0000 mg | ORAL_TABLET | Freq: Every day | ORAL | Status: DC
  Administered 2023-08-26: 10 mg via ORAL
  Filled 2023-08-25: qty 1

## 2023-08-25 MED ORDER — SODIUM CHLORIDE 0.9% FLUSH
3.0000 mL | Freq: Two times a day (BID) | INTRAVENOUS | Status: DC
  Administered 2023-08-25 – 2023-08-26 (×2): 3 mL via INTRAVENOUS

## 2023-08-25 MED ORDER — RIVAROXABAN 15 MG PO TABS
15.0000 mg | ORAL_TABLET | Freq: Every day | ORAL | Status: DC
  Administered 2023-08-25: 15 mg via ORAL
  Filled 2023-08-25: qty 1

## 2023-08-25 MED ORDER — ACETAMINOPHEN 325 MG PO TABS
650.0000 mg | ORAL_TABLET | Freq: Four times a day (QID) | ORAL | Status: DC | PRN
Start: 2023-08-25 — End: 2023-08-26
  Administered 2023-08-26: 650 mg via ORAL
  Filled 2023-08-25: qty 2

## 2023-08-25 MED ORDER — MIRABEGRON ER 50 MG PO TB24
50.0000 mg | ORAL_TABLET | Freq: Every day | ORAL | Status: DC
  Administered 2023-08-26: 50 mg via ORAL
  Filled 2023-08-25: qty 1

## 2023-08-25 MED ORDER — BUDESONIDE 0.5 MG/2ML IN SUSP
2.0000 mL | Freq: Two times a day (BID) | RESPIRATORY_TRACT | Status: DC
  Administered 2023-08-25 – 2023-08-26 (×2): 0.5 mg via RESPIRATORY_TRACT
  Filled 2023-08-25 (×2): qty 2

## 2023-08-25 MED ORDER — AZITHROMYCIN 250 MG PO TABS
500.0000 mg | ORAL_TABLET | Freq: Every day | ORAL | Status: DC
  Administered 2023-08-25 – 2023-08-26 (×2): 500 mg via ORAL
  Filled 2023-08-25 (×2): qty 2

## 2023-08-25 MED ORDER — POTASSIUM CHLORIDE CRYS ER 20 MEQ PO TBCR
20.0000 meq | EXTENDED_RELEASE_TABLET | Freq: Once | ORAL | Status: AC
  Administered 2023-08-25: 20 meq via ORAL
  Filled 2023-08-25: qty 1

## 2023-08-25 MED ORDER — INSULIN ASPART 100 UNIT/ML IJ SOLN
0.0000 [IU] | Freq: Three times a day (TID) | INTRAMUSCULAR | Status: DC
  Administered 2023-08-25: 2 [IU] via SUBCUTANEOUS
  Administered 2023-08-25: 5 [IU] via SUBCUTANEOUS
  Administered 2023-08-26: 2 [IU] via SUBCUTANEOUS

## 2023-08-25 NOTE — H&P (View-Only) (Signed)
 Cardiology Consultation   Patient ID: Mike Hunter MRN: [id number]; DOB: 1950/06/17  Admit date: 08/24/2023 Date of Consult: 08/25/2023  PCP:  Randel Pigg, Dorma Russell, MD   [facility] Providers Cardiologist:  None   -- Followed by [facility]   Patient Profile:   Mike Hunter is a 73 y.o. male with a hx of CAD, atrial fibrillation, HLD, chronic HFpEF, aortic valve stenosis, type 2 DM, asthma, sleep apnea, carotid artery disease Who is being seen 08/25/2023 for the evaluation of atrial flutter at the request of Dr. Allena Katz.  History of Present Illness:   Mike Hunter is a 73 year old male with above medical history who has been followed by [facility] cardiology.  Per chart review, patient previously underwent cardiac catheterization in 01/2018 through [facility] that showed mild nonobstructive coronary artery disease with normal LV function, EF 65%.  Recommended evaluation for noncardiac causes of symptoms.  Later, echocardiogram in 10/2019 showed EF 55-60% with mild LVH, normal wall motion, no significant valvular abnormalities.  There was a small to moderate size pericardial effusion with no tamponade physiology present.  Repeat echo 01/2020 showed a small pericardial effusion.  Patient was later admitted to the hospital in 07/2020 with atrial fibrillation.  While in the hospital, he converted to normal sinus rhythm.  He was started on amiodarone and Xarelto.  Echocardiogram in 07/2020 showed EF 55-60%.  A nuclear stress test in 10/2021 showed normal myocardial uptake both with stress and at rest, no evidence for inducible ischemia.  EF normal at 55%.  Echocardiogram in 02/2022 showed EF 55-60%, mild LVH, mild aortic stenosis, mild mitral regurgitation.   In 02/2023, patient reported shortness of breath and chest tightness with activities.  Underwent stress test on 03/03/2023 that showed no reversible ischemia or infarction, normal LV wall motion,  EF 51%.  Echocardiogram on 03/03/2023 showed EF 55-60% with mild mitral regurgitation, no pericardial effusion.  Cardiac monitor in 03/2023 showed predominantly normal sinus rhythm with 6 runs of SVT.  He later had a dobutamine stress echocardiogram in 05/2023 which showed evidence of inducible ischemia.  He was seen by his cardiologist who felt that patient had nonanginal chest pain that was unlikely to be related to his heart.  MD did not suspect that patient would benefit from heart catheterization or any other cardiac procedures at this point.  His dobutamine stress echo also showed moderate aortic stenosis.  He was admitted to [facility] long hospital in 05/2023 with pneumonia.  Echocardiogram at that time showed EF 60-65%, no regional wall motion abnormalities, grade 3 diastolic dysfunction, moderately enlarged RV, small pericardial effusion present, mild-moderate aortic valve regurgitation.  Patient presented to the ED at [facility] on 3/10 complaining of palpitations that have been ongoing for 2-3 weeks.  Patient was taking both Plavix and Xarelto. Initial vital signs in the ED showed HR 125 BPM, BP 122/82, oxygen 100% on room air. EKG showed atrial flutter with HR 114 BPM. CXR showed no active cardiopulmonary disease. COVID, flu, RSV negative. TSH within normal limits. hsTn negative x2. K 3.4, creatinine 2.15, WBC 9.5, hemoglobin 10.5, platelets 402. CTA chest showed no PE, scattered areas of ground-glass opacity in both lower lobes, suspicious for pneumonitis.   On interview, patient confirms that he has been followed by [facility] for chronic HFpEF, coronary artery disease, paroxysmal atrial fibrillation, and aortic valve stenosis.  Reports that up until recently, his A-fib had  been very well-controlled.  He was admitted to [facility] long hospital in 05/2023 with pneumonia.  He says that he has been having pretty much consistent episodes of shortness of breath  and chest discomfort and irregular pounding sensation over the last 3 weeks suggestive of when he is been in A-fib.  He also notes.  Pretty significant fatigue and exercise intolerance.  He does state his symptoms are exacerbated with activity but has ruled out for MI despite persistent chest discomfort.  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 Authorizing Provider  albuterol (PROVENTIL HFA;VENTOLIN HFA) 108 (90 BASE) MCG/ACT inhaler Inhale 2 puffs into the lungs every 4 (four) hours as needed for wheezing.   Yes [provider]  amiodarone (PACERONE) 200 MG tablet Take 200 mg by mouth 2 (two) times daily. 04/22/22  Yes [provider]  atorvastatin (LIPITOR) 40 MG tablet Take 40 mg by mouth every evening.    Yes [provider]  budesonide (PULMICORT) 0.5 MG/2ML nebulizer solution Take 2 mLs by nebulization 2 (two) times daily.   Yes [provider]  budesonide-formoterol (SYMBICORT) 160-4.5 MCG/ACT inhaler Inhale 2 puffs into the lungs 2 (two) times daily. 07/03/16  Yes [provider]  clopidogrel (PLAVIX) 75 MG tablet Take 1 tablet by mouth daily. 05/18/23 05/17/24 Yes [provider]  dextromethorphan-guaiFENesin (MUCINEX DM) 30-600 MG 12hr tablet Take 1 tablet by mouth 2 (two) times daily. Patient taking differently: Take 1 tablet by  mouth daily as needed for cough. 06/10/23  Yes Glade Lloyd, MD  donepezil (ARICEPT) 10 MG tablet Take 10 mg by mouth at bedtime.   Yes [provider]  eszopiclone (LUNESTA) 2 MG TABS tablet Take 2 mg by mouth at bedtime as needed for sleep. 04/23/23  Yes [provider]  insulin aspart (NOVOLOG FLEXPEN) 100 UNIT/ML injection Inject 26-44 Units into the skin See admin instructions. 5 units prior to meals for cbg>= 150  Sliding scale (base is 26u in the AM, 30u at noon, and  36-44 at night   Yes [provider]  Insulin Degludec (TRESIBA FLEXTOUCH [place]) Inject 40-50 Units into the skin See admin instructions. 40 morning, 50 night   Yes [provider]  isosorbide mononitrate (IMDUR) 30 MG 24 hr tablet Take 1 tablet by mouth daily. 01/22/22  Yes [provider]  latanoprost (XALATAN) 0.005 % ophthalmic solution Place 1 drop into both eyes at bedtime.     Yes [provider]  memantine (NAMENDA) 5 MG tablet Take 5 mg by mouth 2 (two) times daily.   Yes [provider]  mirabegron ER (MYRBETRIQ) 50 MG TB24 tablet Take 1 tablet by mouth daily. 12/05/22 11/30/23 Yes  [provider]  montelukast (SINGULAIR) 10 MG tablet Take 10 mg by mouth at bedtime.   Yes [provider]  Multiple Vitamin (MULTIVITAMIN WITH MINERALS) TABS tablet Take 1 tablet by mouth daily.   Yes [provider]  Multiple Vitamins-Minerals (PRESERVISION AREDS PO) Take 1 tablet by mouth daily.   Yes [provider]  omeprazole (PRILOSEC) 40 MG capsule Take 1 capsule by mouth at bedtime. 02/02/17  Yes [provider]  oxybutynin (DITROPAN-XL) 10 MG 24 hr tablet Take 10 mg by mouth daily.   Yes [provider]  sertraline (ZOLOFT) 50 MG tablet Take 50 mg by mouth every morning.    Yes [provider]  spironolactone (ALDACTONE) 25 MG tablet Take 25 mg by mouth daily.   Yes [provider]  torsemide (DEMADEX) 20  MG tablet Take 20-40 mg by mouth See admin instructions. Take two tablets by mouth in the morning and then take one tablet in the evening per patient and wife   Yes [provider]  Monte Fantasia INHUB 250-50 MCG/ACT AEPB Inhale 1 puff into the lungs in the morning and at bedtime. 04/20/23  Yes [provider]  XARELTO 20 MG TABS tablet Take 20 mg by mouth daily with supper. 04/04/22  Yes [provider]    Inpatient Medications: Scheduled Meds:  amiodarone  200 mg Oral Daily   atorvastatin  80 mg Oral QPM   budesonide  2 mL Nebulization BID   clopidogrel  75 mg Oral Daily   insulin aspart  0-15 Units Subcutaneous TID WC   insulin aspart  0-5 Units Subcutaneous QHS   insulin degludec  40-50 Units Subcutaneous See admin instructions   isosorbide mononitrate  30 mg Oral Daily   memantine  5 mg Oral BID   mirabegron ER  50 mg Oral Daily   mometasone-formoterol  2 puff Inhalation BID   montelukast  10 mg Oral QHS   oxybutynin  10 mg Oral Daily   pantoprazole  40 mg Oral Daily   Rivaroxaban  15 mg Oral Q supper   sertraline  100 mg Oral Daily   spironolactone  25 mg Oral Daily   torsemide  40 mg Oral BID   Continuous Infusions:  sodium chloride 250 mL (08/24/23 2036)   diltiazem (CARDIZEM) infusion 15 mg/hr (08/25/23 0123)   PRN Meds: sodium chloride  Allergies:    Allergies  Allergen Reactions   Buprenorphine Hcl Nausea And Vomiting    Other reaction(s): Nausea And Vomiting    Hydromorphone Other (See Comments)    Other reaction(s): Mental Status Changes (intolerance) Other reaction(s): HALLUCINATIONS hallucinations hallucinations hallucinations hallucinations    Codeine Nausea And Vomiting   Mirtazapine     Other reaction(s): Other (See Comments) Nightmares & altered mental status    Morphine And Codeine Nausea And Vomiting    Social History:   Social History   Socioeconomic History   Marital status: Married    Spouse name: Not on file    Number of children: Not on file   Years of education: Not on file   Highest education level: Not on file  Occupational History   Not on file  Tobacco Use   Smoking status: Former   Smokeless tobacco: Never  Vaping Use   Vaping status: Never Used  Substance and Sexual Activity   Alcohol use: No   Drug use: No   Sexual activity: Not on file  Other Topics Concern   Not on file  Social History Narrative   Not on file   Social Drivers of Health   Financial Resource Strain: Not on file  Food Insecurity: No Food Insecurity (08/25/2023)   Hunger Vital Sign    Worried About Running Out of Food in the Last Year: Never true    Ran Out of Food in the Last Year: Never true  Transportation Needs: No Transportation Needs (08/25/2023)   PRAPARE - Administrator, Civil Service (Medical): No    Lack of Transportation (Non-Medical): No  Physical Activity: Not on file  Stress: Not on file  Social Connections: Socially Integrated (08/25/2023)   Social Connection and Isolation Panel [NHANES]    Frequency of Communication with Friends and Family: Twice a week    Frequency of Social Gatherings with Friends and Family: Three times a week    Attends Religious Services: More than 4 times per year    Active Member of Clubs or Organizations: Yes    Attends Banker Meetings: 1 to 4 times per year    Marital Status: Married  Catering manager Violence: Not At Risk (08/25/2023)   Humiliation, Afraid, Rape, and Kick questionnaire    Fear of Current or Ex-Partner: No    Emotionally Abused: No    Physically Abused: No    Sexually Abused: No    Family History:   History reviewed. No pertinent family history.   ROS:  Please see the history of present illness.   All other ROS reviewed and negative.     Physical Exam/Data:   Vitals:   08/25/23 1245 08/25/23 1400 08/25/23 1500 08/25/23 1529  BP:  120/68  123/78  Pulse: (!) 107   (!) 108  Resp: (!) 23 (!) 22  19  Temp:    98.2  F (36.8 C)  TempSrc:    Oral  SpO2: 99%   100%  Weight:   116.5 kg   Height:   5\' 11"  (1.803 m)     Intake/Output Summary (Last 24 hours) at 08/25/2023 1617 Last data filed at 08/25/2023 0840 Gross per 24 hour  Intake 850.33 ml  Output 926 ml  Net -75.67 ml      08/25/2023    3:00 PM 08/24/2023    2:10 PM 06/20/2023   12:06 PM  Last 3 Weights  Weight (lbs) 256 lb 14.4 oz 260 lb 257 lb 15 oz  Weight (kg) 116.529 kg 117.935 kg 117 kg     Body mass index is 35.83 kg/m.  General: Obese, moderately ill-appearing.  In no acute distress.  Sitting comfortably in the bed with head elevated HEENT: normal Neck: no JVD Vascular: Radial pulses 2+ bilaterally Cardiac:  normal S1, S2; irregular rate and rhythm; systolic murmur at right upper sternal border Lungs:  clear to auscultation bilaterally, no wheezing, rhonchi or rales.  Normal work of breathing on room air Abd: soft, nontender Ext: no edema in bilateral lower extremities Musculoskeletal:  No deformities, BUE and BLE strength normal and equal Skin: warm and dry  Neuro:  CNs 2-12 intact Psych:  Normal affect   EKG:  The EKG was personally reviewed and demonstrates:   atrial flutter with HR 114 BPM -> additional EKGs reviewed.  Normal consistent with A-fib. Telemetry:  Telemetry was personally reviewed and demonstrates: Atypical atrial flutter with variable AV block.  Heart rate in the 90s-100s -> on my assessment, Also noted clear A-fib with rates in the 100-120s.  Relevant CV Studies:  Echocardiogram 06/09/23 : Normal  LVEF 60- 65%.  No RWMA.  Restrictive filling pattern-GR 3 DD.  Moderate RV enlargement.  Read as mild LA dilation but appears to be more at least moderate.  All posterior pericardial effusioN.  Mild to moderate AS mean AVG 22 mmHg.  Mildly elevated RAP 80 mmHg.   Laboratory Data:  High Sensitivity Troponin:   Recent Labs  Lab 08/24/23 1431 08/24/23 1642  TROPONINIHS 7 8     Chemistry Recent Labs  Lab  08/24/23 1431  NA 138  K 3.4*  CL 101  CO2 28  GLUCOSE 135*  BUN 26*  CREATININE 2.15*  CALCIUM 8.6*  MG 2.2  GFRNONAA 32*  ANIONGAP 9    Recent Labs  Lab 08/24/23 1431  PROT 7.4  ALBUMIN 3.4*  AST 25  ALT 29  ALKPHOS 85  BILITOT 0.6   Lipids No results for input(s): "CHOL", "TRIG", "HDL", "LABVLDL", "LDLCALC", "CHOLHDL" in the last 168 hours.  Hematology Recent Labs  Lab 08/24/23 1431 08/25/23 1544  WBC 9.5 11.2*  RBC 4.56 4.65  HGB 10.5* 10.8*  HCT 35.2* 35.7*  MCV 77.2* 76.8*  MCH 23.0* 23.2*  MCHC 29.8* 30.3  RDW 17.2* 17.2*  PLT 402* 376   Thyroid  Recent Labs  Lab 08/24/23 1614  TSH 1.377    BNP Recent Labs  Lab 08/24/23 1431  BNP 177.1*    DDimer No results for input(s): "DDIMER" in the last 168 hours.   Radiology/Studies:  CT Angio Chest PE W and/or Wo Contrast Result Date: 08/24/2023 CLINICAL DATA:  Pulmonary embolism (PE) suspected, high prob Chest pain, shortness of breath and palpitations. EXAM: CT ANGIOGRAPHY CHEST WITH CONTRAST TECHNIQUE: Multidetector CT imaging of the chest was performed using the standard protocol during bolus administration of intravenous contrast. Multiplanar CT image reconstructions and MIPs were obtained to evaluate the vascular anatomy. RADIATION DOSE REDUCTION: This exam was performed according to the departmental dose-optimization program which includes automated exposure control, adjustment of the mA and/or kV according to patient size and/or use of iterative reconstruction technique. CONTRAST:  75mL OMNIPAQUE IOHEXOL 350 MG/ML SOLN COMPARISON:  Radiograph earlier today, chest CT 01/30/2023 FINDINGS: Cardiovascular: There are no filling defects within the pulmonary arteries to suggest pulmonary embolus. The heart is upper normal in size. Trace pericardial fluid adjacent to the left ventricle. Coronary artery calcifications. Mild aortic atherosclerosis. No aortic aneurysm. 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 Mediastinum/Nodes: No enlarged mediastinal or hilar lymph nodes. Tiny hiatal hernia. Otherwise unremarkable esophagus. No thyroid nodule. Lungs/Pleura: Scattered areas of ground-glass opacity in both lower lobes, with faint tree-in-bud opacity in the left upper lobe. No pleural fluid. The trachea and central airways are clear. Upper Abdomen: No acute findings. Left renal cyst. No further follow-up imaging is recommended. Musculoskeletal: T10 compression fracture has progressed from 06/24/2023 thoracic spine CT with increased loss of height superiorly and decreased density in the left aspect of the central vertebral body. Mild inferior T8 compression fracture is unchanged. Mild superior T3 compression fracture is unchanged. Review of the MIP images confirms the above findings. IMPRESSION: 1. No pulmonary embolus. 2. Scattered areas of ground-glass opacity in both lower lobes, with faint tree-in-bud opacity in the left upper lobe. Findings are suspicious for pneumonitis, including atypical infection. 3. T10 compression fracture has progressed from 06/24/2023 thoracic spine CT with increased loss of height. Aortic Atherosclerosis (ICD10-I70.0). Electronically Signed   By: Narda Rutherford M.D.   On: 08/24/2023 19:56   DG Chest 2 View Result  Date: 08/24/2023 CLINICAL DATA:  Chest pain. Shortness of breath. Palpitations for 2-3 weeks. EXAM: CHEST - 2 VIEW COMPARISON:  Chest radiographs 06/24/2023, CT chest, abdomen, and pelvis 01/30/2023 FINDINGS: Cardiac silhouette and mediastinal contours are within normal limits. Moderate calcification within the aortic arch. Flattening of the diaphragms and mild hyperinflation. Flattening of the diaphragms and mild hyperinflation. Mild multilevel degenerative disc changes of the thoracic spine, unchanged. Mild anterior wedging of the T7 vertebral body is unchanged from prior radiographs and CT. IMPRESSION: 1. No active cardiopulmonary disease. 2. Mild  hyperinflation. Electronically Signed   By: Neita Garnet M.D.   On: 08/24/2023 18:01     Assessment and Plan:   Atypical atrial flutter History of PAF => probably the "atypical flutter is more consistent with persistent A-fib -Patient has a history of paroxysmal atrial fibrillation and has been on amiodarone and Xarelto as an outpatient.  Reports that until recently, his A-fib was very well-controlled and he did not have many occurrences -For the past 3 weeks, patient has had near constant palpitations/fluttering feeling in his chest.  Also been a bit more short of breath than usual. He denies any relief from the symptoms.  Also noted variable heart rate.  When his heart rate would become more elevated, he developed a bit of chest discomfort as well  -Patient called his cardiologist with [facility] this AM.  They suspected that he had been in atrial fibrillation for 3 weeks and recommended he go to the ED for evaluation -In the ED, EKG showed atypical atrial flutter with variable AV block.  He was started on IV diltiazem and heart rate improved. - per review of telemetry, patient remains in atrial flutter with variable AV block, heart rate in the 90s-100s -Patient reports excellent compliance with Xarelto and denies missing any doses. Continue xarelto 20 mg daily.  -If patient remains in A-fib/flutter overnight, consider DCCV in AM.  Made him NPO and messaged our schedulers to arrange   -Continue IV diltiazem -Continue amiodarone 200 mg daily => will give additional 40 mg tonight and tomorrow -Start metoprolol to tartrate 25 mg twice daily for improved heart rate control overnight -Ordered limited echo to reassess EF after patient has likely been in A-fib for 3 weeks  Chronic HFpEF Moderate aortic stenosis -Most recent echocardiogram from 05/2023 showed EF 60 to 65%, grade 3 diastolic dysfunction, normal RV systolic function.  Mild-moderate aortic valve stenosis -Patient euvolemic on exam  today -Continue to monitor AS as an outpatient - Continue home torsemide 40 mg BID, spironolactone 25 mg daily   Chronic chest pain Nonobstructive CAD -Cath in 2019 showed minimal, nonobstructive CAD -Patient has reported a 4-5-year history of chest pain that has been atypical.  Dobutamine stress echo in 07/2022 showed evidence of ischemia.  However, his cardiologist at [facility] did not think that patient had concerning symptoms and recommended deferring further ischemic evaluation -In the ED here, patient's high-sensitivity troponin has been negative x 2 in the setting of tachycardia.  In a way, patient has passed a stress test.  He gets a bit of chest discomfort when his heart rate becomes elevated, but denies chest pain otherwise. -No further ischemic evaluation needed at this time -Continue Lipitor 80 mg daily, Imdur 30 mg daily, Plavix 75 mg daily -If symptoms do not resolve with restoring sinus rhythm, we may then want to consider additional ischemic evaluation, for now we will hold off.   Risk Assessment/Risk Scores:    CHA2DS2-VASc Score = 6  This indicates a 9.7% annual risk of stroke. The patient's score is based upon: CHF History: 1 HTN History: 1 Diabetes History: 1 Stroke History: 2 Vascular Disease History: 0 Age Score: 1 Gender Score: 0        For questions or updates, please contact [facility] Please consult [email or web address] for contact info under    Signed, Jonita Albee, PA-C  08/25/2023 4:17 PM    ATTENDING ATTESTATION  I have seen, examined and evaluated the patient this evening in consultation along with [facility], PA.  After reviewing all the available data and chart, we discussed the patients laboratory, study & physical findings as well as symptoms in detail.  I agree with her findings, examination as well as impression recommendations as per our discussion.    Attending adjustments noted in italics.   Patient with apparent  longstanding PAF ostensibly on amiodarone for rhythm control and presents with what sounds like 3 weeks of persistent A-fib that is somewhat symptomatic.  He has been on amiodarone long-term for rhythm control which has been relatively effective until recently with his episode.  He has had previous ischemic evaluations of abnormal and I think his symptoms are probably related to the arrhythmia.  Therefore plan is to try to restore sinus rhythm and reassess symptoms prior to determining further evaluation. Will give additional amiodarone 200 mg this evening and add additional 200 mg dose to the standing dose of 200 mg for the morning.  We will attempt to get him scheduled for DCCV as he has been on uninterrupted DAPT.  We will follow all after.  I think agreeable to restore sinus rhythm and his symptoms improved, would strongly recommend that he follow-up with the EP through his primary cardiologist to discuss A-fib ablation which would then preclude the need for long-term amiodarone use.    Marykay Lex, MD, MS Bryan Lemma, M.D., M.S. Interventional Cardiologist  [facility]  Pager # [phone] Phone # [phone] [address] [place], [place] [address]

## 2023-08-25 NOTE — ED Notes (Signed)
 Attempted to call and update wife. No answer. Left voicemail

## 2023-08-25 NOTE — Plan of Care (Signed)
 Alert and oriented. Admitted to 6E.  Cardizem gtt continues to infuse.   Problem: Education: Goal: Ability to describe self-care measures that may prevent or decrease complications (Diabetes Survival Skills Education) will improve Outcome: Progressing Goal: Individualized Educational Video(s) Outcome: Progressing   Problem: Coping: Goal: Ability to adjust to condition or change in health will improve Outcome: Progressing   Problem: Fluid Volume: Goal: Ability to maintain a balanced intake and output will improve Outcome: Progressing   Problem: Health Behavior/Discharge Planning: Goal: Ability to identify and utilize available resources and services will improve Outcome: Progressing

## 2023-08-25 NOTE — H&P (Signed)
 History and Physical    Patient: Mike Hunter UEA:[id number] DOB: July 10, 1950 DOA: 08/24/2023 DOS: the patient was seen and examined on 08/25/2023 PCP: Randel Pigg, Dorma Russell, MD  Patient coming from: [facility]   Chief Complaint:  Chief Complaint  Patient presents with   Palpitations   HPI: Mike Hunter is a 73 y.o. male with medical history significant of asthma, T2DM, class II obesity, HLD, HTN, GERD, COPD on 1 L [place] at night, OSA on CPAP, AAA, left carotid stenosis s/p stent placement on 12/19, CKD 3A, paroxysmal A-fib on Xarelto, BPH, urge incontinence, bipolar disorder, anxiety and depression, episodic mood disorder, CAD, non-rheumatic aortic valve stenosis, osteoarthritis and chronic diastolic HF -- coming for chest pain palpitation , cough and sob.  Patient otherwise denies any complaints.  [facility] [place] patient's chest pain was about 3 out of 10 and had been going on for the past 2 to 3 weeks he is on anticoagulation with Plavix and Xarelto.  Initial heart rate at that time was a flutter at 114 with a negative chest x-ray patient underwent a CTA for evaluation of PE which showed scattered areas in both lower lobes and opacity in left upper lobe consistent with pneumonia.  Labs yesterday showed potassium of 3.4 glucose 135 creatinine 2.15  Review of Systems  Respiratory:  Positive for cough and shortness of breath.   Cardiovascular:  Positive for chest pain and palpitations.   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 He has never used smokeless tobacco. He reports that he does not drink alcohol and does not use drugs.  Allergies  Allergen Reactions   Buprenorphine Hcl Nausea And Vomiting    Other reaction(s): Nausea And Vomiting    Hydromorphone Other (See Comments)    Other reaction(s): Mental Status Changes (intolerance) Other reaction(s): HALLUCINATIONS hallucinations hallucinations hallucinations hallucinations    Codeine Nausea And Vomiting   Mirtazapine     Other reaction(s): Other (See Comments) Nightmares & altered mental status    Morphine And Codeine Nausea And Vomiting    History reviewed. No pertinent family history.  Prior to Admission medications   Medication Sig Start Date End Date Taking? Authorizing Provider  albuterol (PROVENTIL HFA;VENTOLIN HFA) 108 (90 BASE) MCG/ACT inhaler Inhale 2 puffs into the lungs every 4 (four) hours as needed for wheezing.    [provider]  amiodarone (PACERONE) 200 MG tablet Take 200 mg by mouth 2 (two) times daily. 04/22/22   [provider]  atorvastatin (LIPITOR) 40 MG tablet Take 40 mg by mouth every evening.     [provider]  budesonide (PULMICORT) 0.5 MG/2ML nebulizer solution Take 2 mLs by nebulization 2 (two) times daily.    [provider]  budesonide-formoterol [facility])  160-4.5 MCG/ACT inhaler Inhale 2 puffs into the lungs 2 (two) times daily. 07/03/16   [provider]  carvedilol (COREG) 6.25 MG tablet Take 12.5 mg by mouth 2 (two) times daily with a meal.     [provider]  clopidogrel (PLAVIX) 75 MG tablet Take 1 tablet by mouth daily. 05/18/23 05/17/24  [provider]  dextromethorphan-guaiFENesin (MUCINEX DM) 30-600 MG 12hr tablet Take 1 tablet by mouth 2 (two) times daily. 06/10/23   Glade Lloyd, MD  donepezil (ARICEPT) 10 MG tablet Take 10 mg by mouth at bedtime.     [provider]  eszopiclone (LUNESTA) 2 MG TABS tablet Take 2 mg by mouth at bedtime as needed. 04/23/23   [provider]  insulin aspart (NOVOLOG FLEXPEN) 100 UNIT/ML injection Inject 26-44 Units into the skin See admin instructions. 5 units prior to meals for cbg>= 150  Sliding scale (base is 26u in the AM, 30u at noon, and  36-44 at night    [provider]  Insulin Degludec (TRESIBA FLEXTOUCH [place]) Inject 40-50 Units into the skin See admin instructions. 40 morning, 50 night    [provider]  isosorbide mononitrate (IMDUR) 30 MG 24 hr tablet Take 1 tablet by mouth daily. 01/22/22   [provider]  latanoprost (XALATAN) 0.005 % ophthalmic solution Place 1 drop into both eyes at bedtime.      [provider]  memantine (NAMENDA) 5 MG tablet Take 5 mg by mouth 2 (two) times daily.    [provider]  mirabegron ER (MYRBETRIQ) 50 MG TB24 tablet Take 1 tablet by mouth daily. 12/05/22 11/30/23  [provider]  montelukast (SINGULAIR) 10 MG tablet Take 10 mg by mouth at bedtime.    [provider]  omeprazole (PRILOSEC) 40 MG capsule Take 1 capsule by mouth at bedtime. 02/02/17   [provider]  oxybutynin (DITROPAN-XL) 10 MG 24 hr tablet Take 10 mg by mouth daily.    [provider]  sertraline (ZOLOFT) 50 MG tablet Take 50 mg by mouth every morning.     [provider]  sodium bicarbonate 650 MG tablet Take 1 tablet (650 mg total) by mouth 3 (three) times daily. 06/10/23   Glade Lloyd, MD  spironolactone (ALDACTONE) 25 MG tablet Take 25 mg by mouth daily.    [provider]  torsemide (DEMADEX) 20 MG tablet Take 40 mg by mouth 2 (two) times daily.    [provider]  Monte Fantasia INHUB 250-50 MCG/ACT AEPB Inhale 1 puff into the lungs in the morning and at bedtime. 04/20/23   [provider]  XARELTO 20 MG TABS tablet Take 20 mg by mouth daily with supper. 04/04/22    [provider]    Physical Exam: Vitals:   08/25/23 1245 08/25/23 1400 08/25/23 1500 08/25/23 1529  BP:  120/68  123/78  Pulse: (!) 107   (!) 108  Resp: (!) 23 (!) 22  19  Temp:    98.2 F (36.8 C)  TempSrc:    Oral  SpO2: 99%   100%  Weight:   116.5 kg   Height:   5\' 11"  (1.803 m)    Physical Exam Vitals and nursing note reviewed.  Constitutional:      General: He is not in acute distress.    Appearance: He is obese.  HENT:     Head: Normocephalic and atraumatic.     Right Ear: Hearing normal.     Left Ear:  Hearing normal.     Nose: Nose normal. No nasal deformity.     Mouth/Throat:     Lips: Pink.     Tongue: No lesions.  Eyes:     General: Lids are normal.     Extraocular Movements: Extraocular movements intact.  Cardiovascular:     Rate and Rhythm: Normal rate. Rhythm irregular.     Heart sounds: Normal heart sounds.  Pulmonary:     Effort: Pulmonary effort is normal.     Breath sounds: Normal breath sounds.  Abdominal:     General: Abdomen is protuberant. Bowel sounds are normal. There is no distension.     Palpations: Abdomen is soft. There is no mass.     Tenderness: There is no abdominal tenderness.  Musculoskeletal:     Right lower leg: No edema.     Left lower leg: No edema.  Skin:    General: Skin is warm.  Neurological:     General: No focal deficit present.     Mental Status: He is alert and oriented to person, place, and time.     Cranial Nerves: Cranial nerves 2-12 are intact.  Psychiatric:        Attention and Perception: Attention normal.        Mood and Affect: Mood normal.        Speech: Speech normal.        Behavior: Behavior normal. Behavior is cooperative.    Data Reviewed: EKG: A flutter at 114 QTc 584, right axis and incomplete right bundle branch block, nonspecific ST elevations in lateral leads. Results for orders placed or performed during the hospital encounter of 08/24/23 (from the past 48 hours)  Basic metabolic  panel     Status: Abnormal   Collection Time: 08/24/23  2:31 PM  Result Value Ref Range   Sodium 138 135 - 145 mmol/L   Potassium 3.4 (L) 3.5 - 5.1 mmol/L   Chloride 101 98 - 111 mmol/L   CO2 28 22 - 32 mmol/L   Glucose, Bld 135 (H) 70 - 99 mg/dL    Comment: Glucose reference range applies only to samples taken after fasting for at least 8 hours.   BUN 26 (H) 8 - 23 mg/dL   Creatinine, Ser 6.04 (H) 0.61 - 1.24 mg/dL   Calcium 8.6 (L) 8.9 - 10.3 mg/dL   GFR, Estimated 32 (L) >60 mL/min    Comment: (NOTE) Calculated using the CKD-EPI Creatinine Equation (2021)    Anion gap 9 5 - 15    Comment: Performed at [facility], [address] [place], [place] [address]  CBC     Status: Abnormal   Collection Time: 08/24/23  2:31 PM  Result Value Ref Range   WBC 9.5 4.0 - 10.5 K/uL   RBC 4.56 4.22 - 5.81 MIL/uL   Hemoglobin 10.5 (L) 13.0 - 17.0 g/dL   HCT 11.9 (L) 14.7 - 82.9 %   MCV 77.2 (L) 80.0 - 100.0 fL   MCH 23.0 (L) 26.0 - 34.0 pg   MCHC 29.8 (L) 30.0 - 36.0 g/dL   RDW 56.2 (H) 13.0 - 86.5 %   Platelets 402 (H) 150 - 400 K/uL   nRBC 0.0 0.0 - 0.2 %    Comment: Performed at [facility], [address] [place], [place] [address]  Troponin I (High Sensitivity)     Status: None   Collection Time: 08/24/23  2:31 PM  Result Value Ref  Range   Troponin I (High Sensitivity) 7 <18 ng/L    Comment: (NOTE) Elevated high sensitivity troponin I (hsTnI) values and significant  changes across serial measurements may suggest ACS but many other  chronic and acute conditions are known to elevate hsTnI results.  Refer to the "Links" section for chest pain algorithms and additional  guidance. Performed at [facility], [address] [place], [place] [address]   Hepatic function panel     Status: Abnormal   Collection Time: 08/24/23  2:31 PM  Result Value Ref Range   Total Protein 7.4 6.5 - 8.1 g/dL   Albumin 3.4 (L) 3.5 - 5.0 g/dL   AST 25 15 - 41 U/L    ALT 29 0 - 44 U/L   Alkaline Phosphatase 85 38 - 126 U/L   Total Bilirubin 0.6 0.0 - 1.2 mg/dL   Bilirubin, Direct <1.9 0.0 - 0.2 mg/dL   Indirect Bilirubin NOT CALCULATED 0.3 - 0.9 mg/dL    Comment: Performed at [facility], [address] [place], [place] [address]  Lipase, blood     Status: None   Collection Time: 08/24/23  2:31 PM  Result Value Ref Range   Lipase 36 11 - 51 U/L    Comment: Performed at [facility], [address], [place], [place] [address]  Brain natriuretic peptide     Status: Abnormal   Collection Time: 08/24/23  2:31 PM  Result Value Ref Range   B Natriuretic Peptide 177.1 (H) 0.0 - 100.0 pg/mL    Comment: Performed at [facility], [address] [place], [place] [address]  Magnesium     Status: None   Collection Time: 08/24/23  2:31 PM  Result Value Ref Range   Magnesium 2.2 1.7 - 2.4 mg/dL    Comment: Performed at [facility], [address] [place], [place] [address]  Resp panel by RT-PCR (RSV, Flu A&B, Covid) Anterior Nasal Swab     Status: None   Collection Time: 08/24/23  4:14 PM   Specimen: Anterior Nasal Swab  Result Value Ref Range   SARS Coronavirus 2 by RT PCR NEGATIVE NEGATIVE    Comment: (NOTE) SARS-CoV-2 target nucleic acids are NOT DETECTED.  The SARS-CoV-2 RNA is generally detectable in upper respiratory specimens during the acute phase of infection. The lowest concentration of SARS-CoV-2 viral copies this assay can detect is 138 copies/mL. A negative result does not preclude SARS-Cov-2 infection and should not be used as the sole basis for treatment or other patient management decisions. A negative result may occur with  improper specimen collection/handling, submission of specimen other than nasopharyngeal swab, presence of viral mutation(s) within the areas targeted by this assay, and inadequate number of viral copies(<138 copies/mL). A negative result must be combined with clinical  observations, patient history, and epidemiological information. The expected result is Negative.  Fact Sheet for Patients:  BloggerCourse.com  Fact Sheet for Healthcare Providers:  SeriousBroker.it  This test is no t yet approved or cleared by the [place] FDA and  has been authorized for detection and/or diagnosis of SARS-CoV-2 by FDA under an Emergency Use Authorization (EUA). This EUA will remain  in effect (meaning this test can be used) for the duration of the COVID-19 declaration under Section 564(b)(1) of the Act, 21 U.S.C.section 360bbb-3(b)(1), unless the authorization is terminated  or revoked sooner.       Influenza A by  PCR NEGATIVE NEGATIVE   Influenza B by PCR NEGATIVE NEGATIVE    Comment: (NOTE) The Xpert Xpress SARS-CoV-2/FLU/RSV plus assay is intended as an aid in the diagnosis of influenza from Nasopharyngeal swab specimens and should not be used as a sole basis for treatment. Nasal washings and aspirates are unacceptable for Xpert Xpress SARS-CoV-2/FLU/RSV testing.  Fact Sheet for Patients: BloggerCourse.com  Fact Sheet for Healthcare Providers: SeriousBroker.it  This test is not yet approved or cleared by the [place] FDA and has been authorized for detection and/or diagnosis of SARS-CoV-2 by FDA under an Emergency Use Authorization (EUA). This EUA will remain in effect (meaning this test can be used) for the duration of the COVID-19 declaration under Section 564(b)(1) of the Act, 21 U.S.C. section 360bbb-3(b)(1), unless the authorization is terminated or revoked.     Resp Syncytial Virus by PCR NEGATIVE NEGATIVE    Comment: (NOTE) Fact Sheet for Patients: BloggerCourse.com  Fact Sheet for Healthcare Providers: SeriousBroker.it  This test is not yet approved or cleared by the [place]  FDA and has been authorized for detection and/or diagnosis of SARS-CoV-2 by FDA under an Emergency Use Authorization (EUA). This EUA will remain in effect (meaning this test can be used) for the duration of the COVID-19 declaration under Section 564(b)(1) of the Act, 21 U.S.C. section 360bbb-3(b)(1), unless the authorization is terminated or revoked.  Performed at [facility], [address] [place], [place] [address]   TSH     Status: None   Collection Time: 08/24/23  4:14 PM  Result Value Ref Range   TSH 1.377 0.350 - 4.500 uIU/mL    Comment: Performed by a 3rd Generation assay with a functional sensitivity of <=0.01 uIU/mL. Performed at [facility], [address] [place], [place] [address]   Urinalysis, w/ Reflex to Culture (Infection Suspected) -Urine, Clean Catch     Status: Abnormal   Collection Time: 08/24/23  4:19 PM  Result Value Ref Range   Specimen Source URINE, CLEAN CATCH    Color, Urine YELLOW YELLOW   APPearance CLEAR CLEAR   Specific Gravity, Urine 1.010 1.005 - 1.030   pH 6.0 5.0 - 8.0   Glucose, UA >=500 (A) NEGATIVE mg/dL   Hgb urine dipstick NEGATIVE NEGATIVE   Bilirubin Urine NEGATIVE NEGATIVE   Ketones, ur NEGATIVE NEGATIVE mg/dL   Protein, ur NEGATIVE NEGATIVE mg/dL   Nitrite NEGATIVE NEGATIVE   Leukocytes,Ua TRACE (A) NEGATIVE   Squamous Epithelial / HPF 0-5 0 - 5 /HPF   WBC, UA 0-5 0 - 5 WBC/hpf    Comment: Reflex urine culture not performed if WBC <=10, OR if Squamous epithelial cells >5. If Squamous epithelial cells >5, suggest recollection.   RBC / HPF NONE SEEN 0 - 5 RBC/hpf   Bacteria, UA RARE (A) NONE SEEN    Comment: Performed at [facility], [address] [place], [place] [address]  Troponin I (High Sensitivity)     Status: None   Collection Time: 08/24/23  4:42 PM  Result Value Ref Range   Troponin I (High Sensitivity) 8 <18 ng/L    Comment: (NOTE) Elevated high sensitivity troponin I (hsTnI) values and  significant  changes across serial measurements may suggest ACS but many other  chronic and acute conditions are known to elevate hsTnI results.  Refer to the "Links" section for chest pain algorithms and additional  guidance. Performed at [facility], [address] [place], [place] [address]   CBG monitoring, ED     Status: None   Collection Time: 08/25/23  9:02 AM  Result Value Ref Range   Glucose-Capillary 93 70 - 99 mg/dL    Comment: Glucose reference range applies only to samples taken after fasting for at least 8 hours.  CBG monitoring, ED     Status: Abnormal   Collection Time: 08/25/23 12:13 PM  Result Value Ref Range   Glucose-Capillary 134 (H) 70 - 99 mg/dL    Comment: Glucose reference range applies only to samples taken after fasting for at least 8 hours.  Hemoglobin A1c     Status: Abnormal   Collection Time: 08/25/23  3:44 PM  Result Value Ref Range   Hgb A1c MFr Bld 7.3 (H) 4.8 - 5.6 %    Comment: (NOTE) Pre diabetes:          5.7%-6.4%  Diabetes:              >6.4%  Glycemic control for   <7.0% adults with diabetes    Mean Plasma Glucose 162.81 mg/dL    Comment: Performed at [facility], [address] [place], [place] [address]  CBC with Differential/Platelet     Status: Abnormal   Collection Time: 08/25/23  3:44 PM  Result Value Ref Range   WBC 11.2 (H) 4.0 - 10.5 K/uL   RBC 4.65 4.22 - 5.81 MIL/uL   Hemoglobin 10.8 (L) 13.0 - 17.0 g/dL   HCT 09.8 (L) 11.9 - 14.7 %   MCV 76.8 (L) 80.0 - 100.0 fL   MCH 23.2 (L) 26.0 - 34.0 pg   MCHC 30.3 30.0 - 36.0 g/dL   RDW 82.9 (H) 56.2 - 13.0 %   Platelets 376 150 - 400 K/uL   nRBC 0.0 0.0 - 0.2 %   Neutrophils Relative % 81 %   Neutro Abs 9.1 (H) 1.7 - 7.7 K/uL   Lymphocytes Relative 10 %   Lymphs Abs 1.1 0.7 - 4.0 K/uL   Monocytes Relative 7 %   Monocytes Absolute 0.8 0.1 - 1.0 K/uL   Eosinophils Relative 1 %   Eosinophils Absolute 0.2 0.0 - 0.5 K/uL   Basophils Relative 1 %    Basophils Absolute 0.1 0.0 - 0.1 K/uL   Immature Granulocytes 0 %   Abs Immature Granulocytes 0.04 0.00 - 0.07 K/uL    Comment: Performed at [facility], [address], [place], [place] [address]  Comprehensive metabolic panel     Status: Abnormal   Collection Time: 08/25/23  3:44 PM  Result Value Ref Range   Sodium 139 135 - 145 mmol/L   Potassium 3.4 (L) 3.5 - 5.1 mmol/L   Chloride 104 98 - 111 mmol/L   CO2 23 22 - 32 mmol/L   Glucose, Bld 225 (H) 70 - 99 mg/dL    Comment: Glucose reference range applies only to samples taken after fasting for at least 8 hours.   BUN 17 8 - 23 mg/dL   Creatinine, Ser 4.69 (H) 0.61 - 1.24 mg/dL   Calcium 8.7 (L) 8.9 - 10.3 mg/dL   Total Protein 7.0 6.5 - 8.1 g/dL   Albumin 3.1 (L) 3.5 - 5.0 g/dL   AST 26 15 - 41 U/L   ALT 32 0 - 44 U/L   Alkaline Phosphatase 79 38 - 126 U/L   Total Bilirubin 0.5 0.0 - 1.2 mg/dL   GFR, Estimated 47 (L) >60 mL/min  Comment: (NOTE) Calculated using the CKD-EPI Creatinine Equation (2021)    Anion gap 12 5 - 15    Comment: Performed at [facility], [address] [place], [place] [address]  Magnesium     Status: None   Collection Time: 08/25/23  3:44 PM  Result Value Ref Range   Magnesium 2.3 1.7 - 2.4 mg/dL    Comment: Performed at [facility], [address], [place], [place] [address]  Phosphorus     Status: None   Collection Time: 08/25/23  3:44 PM  Result Value Ref Range   Phosphorus 3.1 2.5 - 4.6 mg/dL    Comment: Performed at [facility], [address], [place], [place] [address]  T4, free     Status: None   Collection Time: 08/25/23  3:44 PM  Result Value Ref Range   Free T4 1.08 0.61 - 1.12 ng/dL    Comment: (NOTE) Biotin ingestion may interfere with free T4 tests. If the results are inconsistent with the TSH level, previous test results, or the clinical presentation, then consider biotin interference. If needed, order repeat testing after stopping biotin. Performed  at [facility], [address] [place], [place] [address]   TSH     Status: None   Collection Time: 08/25/23  3:44 PM  Result Value Ref Range   TSH 0.891 0.350 - 4.500 uIU/mL    Comment: Performed by a 3rd Generation assay with a functional sensitivity of <=0.01 uIU/mL. Performed at [facility], [address], [place], [place] [address]   Glucose, capillary     Status: Abnormal   Collection Time: 08/25/23  4:18 PM  Result Value Ref Range   Glucose-Capillary 212 (H) 70 - 99 mg/dL    Comment: Glucose reference range applies only to samples taken after fasting for at least 8 hours.     Assessment and Plan:  >>Palpitations / A.flutter on EKG/ H/O PAF: Cardiology consulted appreciate consult and management, continue patient on diltiazem amiodarone and metoprolol started with tentative plan for cardioversion in AM. Monitor on telemetry. PRN NTG and morphine.   >>C/H CHF with Preserved EF: Last echo in 2024 and EF of 60%, Grade 3 diastolic dysfunction. Cardiology continued pt on torsemide and aldactone.    >>CAP: Cont rocephin and azithromycin.   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 Follow cbc as pt is on anticoagulation.  >>BPH: Cont Flomax.   >>Chest pain / CAD: Ntg PRN. Cont Lipitor and Plavix.    >>DM II: A1c of 7.3 08/2023. Glycemic protocol.    >> AKI on CKD stage 3b: Lab Results  Component Value Date   CREATININE 1.55 (H) 08/25/2023   CREATININE 2.15 (H) 08/24/2023   CREATININE 1.87 (H) 06/20/2023  Avoid contrast study. Renally dose meds.    >>Hypokalemia: 2/2 to diuretic. Replace and follow levels.    Advance Care Planning:   Code Status: Full Code   Consults: Cardiology.  Family Communication:  None.  Severity of Illness: The appropriate patient status for this patient is INPATIENT. Inpatient status is judged to be reasonable and necessary in order to provide the required intensity of service to ensure the patient's safety. The  patient's presenting symptoms, physical exam findings, and initial radiographic and laboratory data in the context of their chronic comorbidities is felt to place them at high risk for further clinical deterioration. Furthermore, it is not anticipated that the patient will be medically stable for discharge from  the hospital within 2 midnights of admission.   * I certify that at the point of admission it is my clinical judgment that the patient will require inpatient hospital care spanning beyond 2 midnights from the point of admission due to high intensity of service, high risk for further deterioration and high frequency of surveillance required.*  Orders Placed This Encounter  Procedures   Resp panel by RT-PCR (RSV, Flu A&B, Covid) Anterior Nasal Swab   DG Chest 2 View   CT Angio Chest PE W and/or Wo Contrast   Basic metabolic panel   CBC   Hepatic function panel   Lipase, blood   Brain natriuretic peptide   Magnesium   TSH   Urinalysis, w/ Reflex to Culture (Infection Suspected) -Urine, Clean Catch   Hemoglobin A1c   CBC with Differential/Platelet   Comprehensive metabolic panel   Magnesium   Phosphorus   T4, free   TSH   Glucose, capillary   Comprehensive metabolic panel   CBC   Diet heart healthy/carb modified Room service appropriate? Yes with Assist; Fluid consistency: Thin   Diet NPO time specified   Document Height and Actual Weight   ED Cardiac monitoring   Cardiac Monitoring Continuous x 48 hours Indications for use: Other; Other indications for use: High risk   Apply Diabetes Mellitus Care Plan   STAT CBG when hypoglycemia is suspected. If treated, recheck every 15 minutes after each treatment until CBG >/= 70 mg/dl   Refer to Hypoglycemia Protocol Sidebar Report for treatment of CBG < 70 mg/dl   Maintain IV access   Vital signs   Notify physician (specify)   Mobility Protocol: No Restrictions RN to initiate protocols based on patient's level of care   Refer to  Sidebar Report Refer to ICU, Med-Surg, Progressive, and Step-Down Mobility Protocol Sidebars   Initiate Adult Central Line Maintenance and Catheter Protocol for patients with central line (CVC, PICC, Port, Hemodialysis, Trialysis)   Daily weights   Intake and Output   Do not place and if present remove PureWick   Initiate Oral Care Protocol   Initiate Carrier Fluid Protocol   RN may order General Admission PRN Orders utilizing "General Admission PRN medications" (through manage orders) for the following patient needs: allergy symptoms (Claritin), cold sores (Carmex), cough (Robitussin DM), eye irritation (Liquifilm Tears), hemorrhoids (Tucks), indigestion (Maalox), Dykman skin irritation (Hydrocortisone Cream), muscle pain Romeo Apple Gay), nose irritation (saline nasal spray) and sore throat (Chloraseptic spray).   Patient has an active order for admit to inpatient/place in observation   Confirm CBC and BMP (or CMP) results within 7 days for inpatient and 30 days for outpatient:   If patient is on warfarin (COUMADIN), place order for and obtain PT-INR and document INR is between 2-3   Full code   Consult to hospitalist   Pulse oximetry check with vital signs   Oxygen therapy Mode or (Route): Nasal cannula; Liters Per Minute: 2; Keep O2 saturation between: greater than 92 %   CBG monitoring, ED   CBG monitoring, ED   EP STUDY   ED EKG   EKG 12-Lead   EKG   EKG   EKG 12-Lead   Admit to Inpatient (patient's expected length of stay will be greater than 2 midnights or inpatient only procedure)   Aspiration precautions   Fall precautions    Author: Gertha Calkin, MD 08/25/2023 7:59 PM  For on call review [email or web address].

## 2023-08-25 NOTE — Anesthesia Preprocedure Evaluation (Signed)
 Anesthesia Evaluation  Patient identified by MRN, date of birth, ID band Patient awake    Reviewed: Allergy & Precautions, NPO status , Patient's Chart, lab work & pertinent test results  History of Anesthesia Complications Negative for: history of anesthetic complications  Airway Mallampati: III  TM Distance: >3 FB Neck ROM: Full    Dental  (+) Dental Advisory Given   Pulmonary asthma , sleep apnea (uses 2L at night and during the day as needed), Continuous Positive Airway Pressure Ventilation and Oxygen sleep apnea , pneumonia (started antibiotics last night), unresolved, COPD (used inhaler this morning),  COPD inhaler and oxygen dependent, former smoker   Pulmonary exam normal breath sounds clear to auscultation       Cardiovascular hypertension, + CAD (non-obstructive) and +CHF (EF 60-65%, grade III diastolic dysfunction)  + dysrhythmias Atrial Fibrillation + Valvular Problems/Murmurs (mild-to-moderate AS)  Rhythm:Irregular Rate:Normal  HLD  TTE 06/09/2023: IMPRESSIONS    1. Left ventricular ejection fraction, by estimation, is 60 to 65%. The  left ventricle has normal function. The left ventricle has no regional  wall motion abnormalities. Left ventricular diastolic parameters are  consistent with Grade III diastolic  dysfunction (restrictive).   2. Right ventricular systolic function is normal. The right ventricular  size is moderately enlarged.   3. Left atrial size was mildly dilated.   4. A small pericardial effusion is present. The pericardial effusion is  posterior to the left ventricle.   5. The mitral valve is normal in structure. Trivial mitral valve  regurgitation. No evidence of mitral stenosis.   6. Normal stroke volume index with DVI .032. The aortic valve is  calcified. There is moderate calcification of the aortic valve. Aortic  valve regurgitation is not visualized. Mild to moderate aortic valve   stenosis. Aortic valve area, by VTI measures  1.34 cm. Aortic valve mean gradient measures 22.0 mmHg. Aortic valve Vmax  measures 2.92 m/s.   7. The inferior vena cava is dilated in size with >50% respiratory  variability, suggesting right atrial pressure of 8 mmHg.     Neuro/Psych  PSYCHIATRIC DISORDERS (conversion disorder) Anxiety Depression Bipolar Disorder   negative neurological ROS     GI/Hepatic ,GERD  Medicated,,  Endo/Other  diabetes (Hgb A1c 7.3), Poorly Controlled, Type 2, Insulin Dependent    Renal/GU CRFRenal disease     Musculoskeletal  (+) Arthritis ,    Abdominal   Peds  Hematology  (+) Blood dyscrasia, anemia Lab Results      Component                Value               Date                      WBC                      11.2 (H)            08/25/2023                HGB                      10.8 (L)            08/25/2023                HCT  35.7 (L)            08/25/2023                MCV                      76.8 (L)            08/25/2023                PLT                      376                 08/25/2023              Anesthesia Other Findings Last Plavix: yesterday  Last Xarelto: yesterday  Reproductive/Obstetrics                             Anesthesia Physical Anesthesia Plan  ASA: 3  Anesthesia Plan: General   Post-op Pain Management:    Induction: Intravenous  PONV Risk Score and Plan: 2 and Treatment may vary due to age or medical condition  Airway Management Planned: Natural Airway and Nasal Cannula  Additional Equipment:   Intra-op Plan:   Post-operative Plan:   Informed Consent: I have reviewed the patients History and Physical, chart, labs and discussed the procedure including the risks, benefits and alternatives for the proposed anesthesia with the patient or authorized representative who has indicated his/her understanding and acceptance.     Dental advisory given  Plan  Discussed with: Anesthesiologist and CRNA  Anesthesia Plan Comments: (Risks of general anesthesia discussed including, but not limited to, sore throat, hoarse voice, chipped/damaged teeth, injury to vocal cords, nausea and vomiting, allergic reactions, lung infection, heart attack, stroke, and death. All questions answered. )        Anesthesia Quick Evaluation

## 2023-08-25 NOTE — Consult Note (Addendum)
 Cardiology Consultation   Patient ID: [name] MRN: [id number]; DOB: 1950/06/17  Admit date: 08/24/2023 Date of Consult: 08/25/2023  PCP:  Randel Pigg, Dorma Russell, MD   [facility] Providers Cardiologist:  None   -- Followed by [facility]   Patient Profile:   Mike Hunter is a 73 y.o. male with a hx of CAD, atrial fibrillation, HLD, chronic HFpEF, aortic valve stenosis, type 2 DM, asthma, sleep apnea, carotid artery disease Who is being seen 08/25/2023 for the evaluation of atrial flutter at the request of Dr. Allena Katz.  History of Present Illness:   Mike Hunter is a 73 year old male with above medical history who has been followed by [facility] cardiology.  Per chart review, patient previously underwent cardiac catheterization in 01/2018 through [facility] that showed mild nonobstructive coronary artery disease with normal LV function, EF 65%.  Recommended evaluation for noncardiac causes of symptoms.  Later, echocardiogram in 10/2019 showed EF 55-60% with mild LVH, normal wall motion, no significant valvular abnormalities.  There was a small to moderate size pericardial effusion with no tamponade physiology present.  Repeat echo 01/2020 showed a small pericardial effusion.  Patient was later admitted to the hospital in 07/2020 with atrial fibrillation.  While in the hospital, he converted to normal sinus rhythm.  He was started on amiodarone and Xarelto.  Echocardiogram in 07/2020 showed EF 55-60%.  A nuclear stress test in 10/2021 showed normal myocardial uptake both with stress and at rest, no evidence for inducible ischemia.  EF normal at 55%.  Echocardiogram in 02/2022 showed EF 55-60%, mild LVH, mild aortic stenosis, mild mitral regurgitation.   In 02/2023, patient reported shortness of breath and chest tightness with activities.  Underwent stress test on 03/03/2023 that showed no reversible ischemia or infarction, normal LV wall motion,  EF 51%.  Echocardiogram on 03/03/2023 showed EF 55-60% with mild mitral regurgitation, no pericardial effusion.  Cardiac monitor in 03/2023 showed predominantly normal sinus rhythm with 6 runs of SVT.  He later had a dobutamine stress echocardiogram in 05/2023 which showed evidence of inducible ischemia.  He was seen by his cardiologist who felt that patient had nonanginal chest pain that was unlikely to be related to his heart.  MD did not suspect that patient would benefit from heart catheterization or any other cardiac procedures at this point.  His dobutamine stress echo also showed moderate aortic stenosis.  He was admitted to [facility] long hospital in 05/2023 with pneumonia.  Echocardiogram at that time showed EF 60-65%, no regional wall motion abnormalities, grade 3 diastolic dysfunction, moderately enlarged RV, small pericardial effusion present, mild-moderate aortic valve regurgitation.  Patient presented to the ED at [facility] on 3/10 complaining of palpitations that have been ongoing for 2-3 weeks.  Patient was taking both Plavix and Xarelto. Initial vital signs in the ED showed HR 125 BPM, BP 122/82, oxygen 100% on room air. EKG showed atrial flutter with HR 114 BPM. CXR showed no active cardiopulmonary disease. COVID, flu, RSV negative. TSH within normal limits. hsTn negative x2. K 3.4, creatinine 2.15, WBC 9.5, hemoglobin 10.5, platelets 402. CTA chest showed no PE, scattered areas of ground-glass opacity in both lower lobes, suspicious for pneumonitis.   On interview, patient confirms that he has been followed by [facility] for chronic HFpEF, coronary artery disease, paroxysmal atrial fibrillation, and aortic valve stenosis.  Reports that up until recently, his A-fib had  been very well-controlled.  He was admitted to [facility] long hospital in 05/2023 with pneumonia.  He says that he has been having pretty much consistent episodes of shortness of breath  and chest discomfort and irregular pounding sensation over the last 3 weeks suggestive of when he is been in A-fib.  He also notes.  Pretty significant fatigue and exercise intolerance.  He does state his symptoms are exacerbated with activity but has ruled out for MI despite persistent chest discomfort.  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 Authorizing Provider  albuterol (PROVENTIL HFA;VENTOLIN HFA) 108 (90 BASE) MCG/ACT inhaler Inhale 2 puffs into the lungs every 4 (four) hours as needed for wheezing.   Yes [provider]  amiodarone (PACERONE) 200 MG tablet Take 200 mg by mouth 2 (two) times daily. 04/22/22  Yes [provider]  atorvastatin (LIPITOR) 40 MG tablet Take 40 mg by mouth every evening.    Yes [provider]  budesonide (PULMICORT) 0.5 MG/2ML nebulizer solution Take 2 mLs by nebulization 2 (two) times daily.   Yes [provider]  budesonide-formoterol (SYMBICORT) 160-4.5 MCG/ACT inhaler Inhale 2 puffs into the lungs 2 (two) times daily. 07/03/16  Yes [provider]  clopidogrel (PLAVIX) 75 MG tablet Take 1 tablet by mouth daily. 05/18/23 05/17/24 Yes [provider]  dextromethorphan-guaiFENesin (MUCINEX DM) 30-600 MG 12hr tablet Take 1 tablet by mouth 2 (two) times daily. Patient taking differently: Take 1 tablet by  mouth daily as needed for cough. 06/10/23  Yes Glade Lloyd, MD  donepezil (ARICEPT) 10 MG tablet Take 10 mg by mouth at bedtime.   Yes [provider]  eszopiclone (LUNESTA) 2 MG TABS tablet Take 2 mg by mouth at bedtime as needed for sleep. 04/23/23  Yes [provider]  insulin aspart (NOVOLOG FLEXPEN) 100 UNIT/ML injection Inject 26-44 Units into the skin See admin instructions. 5 units prior to meals for cbg>= 150  Sliding scale (base is 26u in the AM, 30u at noon, and  36-44 at night   Yes [provider]  Insulin Degludec (TRESIBA FLEXTOUCH [place]) Inject 40-50 Units into the skin See admin instructions. 40 morning, 50 night   Yes [provider]  isosorbide mononitrate (IMDUR) 30 MG 24 hr tablet Take 1 tablet by mouth daily. 01/22/22  Yes [provider]  latanoprost (XALATAN) 0.005 % ophthalmic solution Place 1 drop into both eyes at bedtime.     Yes [provider]  memantine (NAMENDA) 5 MG tablet Take 5 mg by mouth 2 (two) times daily.   Yes [provider]  mirabegron ER (MYRBETRIQ) 50 MG TB24 tablet Take 1 tablet by mouth daily. 12/05/22 11/30/23 Yes  [provider]  montelukast (SINGULAIR) 10 MG tablet Take 10 mg by mouth at bedtime.   Yes [provider]  Multiple Vitamin (MULTIVITAMIN WITH MINERALS) TABS tablet Take 1 tablet by mouth daily.   Yes [provider]  Multiple Vitamins-Minerals (PRESERVISION AREDS PO) Take 1 tablet by mouth daily.   Yes [provider]  omeprazole (PRILOSEC) 40 MG capsule Take 1 capsule by mouth at bedtime. 02/02/17  Yes [provider]  oxybutynin (DITROPAN-XL) 10 MG 24 hr tablet Take 10 mg by mouth daily.   Yes [provider]  sertraline (ZOLOFT) 50 MG tablet Take 50 mg by mouth every morning.    Yes [provider]  spironolactone (ALDACTONE) 25 MG tablet Take 25 mg by mouth daily.   Yes [provider]  torsemide (DEMADEX) 20  MG tablet Take 20-40 mg by mouth See admin instructions. Take two tablets by mouth in the morning and then take one tablet in the evening per patient and wife   Yes [provider]  Monte Fantasia INHUB 250-50 MCG/ACT AEPB Inhale 1 puff into the lungs in the morning and at bedtime. 04/20/23  Yes [provider]  XARELTO 20 MG TABS tablet Take 20 mg by mouth daily with supper. 04/04/22  Yes [provider]    Inpatient Medications: Scheduled Meds:  amiodarone  200 mg Oral Daily   atorvastatin  80 mg Oral QPM   budesonide  2 mL Nebulization BID   clopidogrel  75 mg Oral Daily   insulin aspart  0-15 Units Subcutaneous TID WC   insulin aspart  0-5 Units Subcutaneous QHS   insulin degludec  40-50 Units Subcutaneous See admin instructions   isosorbide mononitrate  30 mg Oral Daily   memantine  5 mg Oral BID   mirabegron ER  50 mg Oral Daily   mometasone-formoterol  2 puff Inhalation BID   montelukast  10 mg Oral QHS   oxybutynin  10 mg Oral Daily   pantoprazole  40 mg Oral Daily   Rivaroxaban  15 mg Oral Q supper   sertraline  100 mg Oral Daily   spironolactone  25 mg Oral Daily   torsemide  40 mg Oral BID   Continuous Infusions:  sodium chloride 250 mL (08/24/23 2036)   diltiazem (CARDIZEM) infusion 15 mg/hr (08/25/23 0123)   PRN Meds: sodium chloride  Allergies:    Allergies  Allergen Reactions   Buprenorphine Hcl Nausea And Vomiting    Other reaction(s): Nausea And Vomiting    Hydromorphone Other (See Comments)    Other reaction(s): Mental Status Changes (intolerance) Other reaction(s): HALLUCINATIONS hallucinations hallucinations hallucinations hallucinations    Codeine Nausea And Vomiting   Mirtazapine     Other reaction(s): Other (See Comments) Nightmares & altered mental status    Morphine And Codeine Nausea And Vomiting    Social History:   Social History   Socioeconomic History   Marital status: Married    Spouse name: Not on file    Number of children: Not on file   Years of education: Not on file   Highest education level: Not on file  Occupational History   Not on file  Tobacco Use   Smoking status: Former   Smokeless tobacco: Never  Vaping Use   Vaping status: Never Used  Substance and Sexual Activity   Alcohol use: No   Drug use: No   Sexual activity: Not on file  Other Topics Concern   Not on file  Social History Narrative   Not on file   Social Drivers of Health   Financial Resource Strain: Not on file  Food Insecurity: No Food Insecurity (08/25/2023)   Hunger Vital Sign    Worried About Running Out of Food in the Last Year: Never true    Ran Out of Food in the Last Year: Never true  Transportation Needs: No Transportation Needs (08/25/2023)   PRAPARE - Administrator, Civil Service (Medical): No    Lack of Transportation (Non-Medical): No  Physical Activity: Not on file  Stress: Not on file  Social Connections: Socially Integrated (08/25/2023)   Social Connection and Isolation Panel [NHANES]    Frequency of Communication with Friends and Family: Twice a week    Frequency of Social Gatherings with Friends and Family: Three times a week    Attends Religious Services: More than 4 times per year    Active Member of Clubs or Organizations: Yes    Attends Banker Meetings: 1 to 4 times per year    Marital Status: Married  Catering manager Violence: Not At Risk (08/25/2023)   Humiliation, Afraid, Rape, and Kick questionnaire    Fear of Current or Ex-Partner: No    Emotionally Abused: No    Physically Abused: No    Sexually Abused: No    Family History:   History reviewed. No pertinent family history.   ROS:  Please see the history of present illness.   All other ROS reviewed and negative.     Physical Exam/Data:   Vitals:   08/25/23 1245 08/25/23 1400 08/25/23 1500 08/25/23 1529  BP:  120/68  123/78  Pulse: (!) 107   (!) 108  Resp: (!) 23 (!) 22  19  Temp:    98.2  F (36.8 C)  TempSrc:    Oral  SpO2: 99%   100%  Weight:   116.5 kg   Height:   5\' 11"  (1.803 m)     Intake/Output Summary (Last 24 hours) at 08/25/2023 1617 Last data filed at 08/25/2023 0840 Gross per 24 hour  Intake 850.33 ml  Output 926 ml  Net -75.67 ml      08/25/2023    3:00 PM 08/24/2023    2:10 PM 06/20/2023   12:06 PM  Last 3 Weights  Weight (lbs) 256 lb 14.4 oz 260 lb 257 lb 15 oz  Weight (kg) 116.529 kg 117.935 kg 117 kg     Body mass index is 35.83 kg/m.  General: Obese, moderately ill-appearing.  In no acute distress.  Sitting comfortably in the bed with head elevated HEENT: normal Neck: no JVD Vascular: Radial pulses 2+ bilaterally Cardiac:  normal S1, S2; irregular rate and rhythm; systolic murmur at right upper sternal border Lungs:  clear to auscultation bilaterally, no wheezing, rhonchi or rales.  Normal work of breathing on room air Abd: soft, nontender Ext: no edema in bilateral lower extremities Musculoskeletal:  No deformities, BUE and BLE strength normal and equal Skin: warm and dry  Neuro:  CNs 2-12 intact Psych:  Normal affect   EKG:  The EKG was personally reviewed and demonstrates:   atrial flutter with HR 114 BPM -> additional EKGs reviewed.  Normal consistent with A-fib. Telemetry:  Telemetry was personally reviewed and demonstrates: Atypical atrial flutter with variable AV block.  Heart rate in the 90s-100s -> on my assessment, Also noted clear A-fib with rates in the 100-120s.  Relevant CV Studies:  Echocardiogram 06/09/23 : Normal  LVEF 60- 65%.  No RWMA.  Restrictive filling pattern-GR 3 DD.  Moderate RV enlargement.  Read as mild LA dilation but appears to be more at least moderate.  All posterior pericardial effusioN.  Mild to moderate AS mean AVG 22 mmHg.  Mildly elevated RAP 80 mmHg.   Laboratory Data:  High Sensitivity Troponin:   Recent Labs  Lab 08/24/23 1431 08/24/23 1642  TROPONINIHS 7 8     Chemistry Recent Labs  Lab  08/24/23 1431  NA 138  K 3.4*  CL 101  CO2 28  GLUCOSE 135*  BUN 26*  CREATININE 2.15*  CALCIUM 8.6*  MG 2.2  GFRNONAA 32*  ANIONGAP 9    Recent Labs  Lab 08/24/23 1431  PROT 7.4  ALBUMIN 3.4*  AST 25  ALT 29  ALKPHOS 85  BILITOT 0.6   Lipids No results for input(s): "CHOL", "TRIG", "HDL", "LABVLDL", "LDLCALC", "CHOLHDL" in the last 168 hours.  Hematology Recent Labs  Lab 08/24/23 1431 08/25/23 1544  WBC 9.5 11.2*  RBC 4.56 4.65  HGB 10.5* 10.8*  HCT 35.2* 35.7*  MCV 77.2* 76.8*  MCH 23.0* 23.2*  MCHC 29.8* 30.3  RDW 17.2* 17.2*  PLT 402* 376   Thyroid  Recent Labs  Lab 08/24/23 1614  TSH 1.377    BNP Recent Labs  Lab 08/24/23 1431  BNP 177.1*    DDimer No results for input(s): "DDIMER" in the last 168 hours.   Radiology/Studies:  CT Angio Chest PE W and/or Wo Contrast Result Date: 08/24/2023 CLINICAL DATA:  Pulmonary embolism (PE) suspected, high prob Chest pain, shortness of breath and palpitations. EXAM: CT ANGIOGRAPHY CHEST WITH CONTRAST TECHNIQUE: Multidetector CT imaging of the chest was performed using the standard protocol during bolus administration of intravenous contrast. Multiplanar CT image reconstructions and MIPs were obtained to evaluate the vascular anatomy. RADIATION DOSE REDUCTION: This exam was performed according to the departmental dose-optimization program which includes automated exposure control, adjustment of the mA and/or kV according to patient size and/or use of iterative reconstruction technique. CONTRAST:  75mL OMNIPAQUE IOHEXOL 350 MG/ML SOLN COMPARISON:  Radiograph earlier today, chest CT 01/30/2023 FINDINGS: Cardiovascular: There are no filling defects within the pulmonary arteries to suggest pulmonary embolus. The heart is upper normal in size. Trace pericardial fluid adjacent to the left ventricle. Coronary artery calcifications. Mild aortic atherosclerosis. No aortic aneurysm. 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 Mediastinum/Nodes: No enlarged mediastinal or hilar lymph nodes. Tiny hiatal hernia. Otherwise unremarkable esophagus. No thyroid nodule. Lungs/Pleura: Scattered areas of ground-glass opacity in both lower lobes, with faint tree-in-bud opacity in the left upper lobe. No pleural fluid. The trachea and central airways are clear. Upper Abdomen: No acute findings. Left renal cyst. No further follow-up imaging is recommended. Musculoskeletal: T10 compression fracture has progressed from 06/24/2023 thoracic spine CT with increased loss of height superiorly and decreased density in the left aspect of the central vertebral body. Mild inferior T8 compression fracture is unchanged. Mild superior T3 compression fracture is unchanged. Review of the MIP images confirms the above findings. IMPRESSION: 1. No pulmonary embolus. 2. Scattered areas of ground-glass opacity in both lower lobes, with faint tree-in-bud opacity in the left upper lobe. Findings are suspicious for pneumonitis, including atypical infection. 3. T10 compression fracture has progressed from 06/24/2023 thoracic spine CT with increased loss of height. Aortic Atherosclerosis (ICD10-I70.0). Electronically Signed   By: Narda Rutherford M.D.   On: 08/24/2023 19:56   DG Chest 2 View Result  Date: 08/24/2023 CLINICAL DATA:  Chest pain. Shortness of breath. Palpitations for 2-3 weeks. EXAM: CHEST - 2 VIEW COMPARISON:  Chest radiographs 06/24/2023, CT chest, abdomen, and pelvis 01/30/2023 FINDINGS: Cardiac silhouette and mediastinal contours are within normal limits. Moderate calcification within the aortic arch. Flattening of the diaphragms and mild hyperinflation. Flattening of the diaphragms and mild hyperinflation. Mild multilevel degenerative disc changes of the thoracic spine, unchanged. Mild anterior wedging of the T7 vertebral body is unchanged from prior radiographs and CT. IMPRESSION: 1. No active cardiopulmonary disease. 2. Mild  hyperinflation. Electronically Signed   By: Neita Garnet M.D.   On: 08/24/2023 18:01     Assessment and Plan:   Atypical atrial flutter History of PAF => probably the "atypical flutter is more consistent with persistent A-fib -Patient has a history of paroxysmal atrial fibrillation and has been on amiodarone and Xarelto as an outpatient.  Reports that until recently, his A-fib was very well-controlled and he did not have many occurrences -For the past 3 weeks, patient has had near constant palpitations/fluttering feeling in his chest.  Also been a bit more short of breath than usual. He denies any relief from the symptoms.  Also noted variable heart rate.  When his heart rate would become more elevated, he developed a bit of chest discomfort as well  -Patient called his cardiologist with [facility] this AM.  They suspected that he had been in atrial fibrillation for 3 weeks and recommended he go to the ED for evaluation -In the ED, EKG showed atypical atrial flutter with variable AV block.  He was started on IV diltiazem and heart rate improved. - per review of telemetry, patient remains in atrial flutter with variable AV block, heart rate in the 90s-100s -Patient reports excellent compliance with Xarelto and denies missing any doses. Continue xarelto 20 mg daily.  -If patient remains in A-fib/flutter overnight, consider DCCV in AM.  Made him NPO and messaged our schedulers to arrange   -Continue IV diltiazem -Continue amiodarone 200 mg daily => will give additional 40 mg tonight and tomorrow -Start metoprolol to tartrate 25 mg twice daily for improved heart rate control overnight -Ordered limited echo to reassess EF after patient has likely been in A-fib for 3 weeks  Chronic HFpEF Moderate aortic stenosis -Most recent echocardiogram from 05/2023 showed EF 60 to 65%, grade 3 diastolic dysfunction, normal RV systolic function.  Mild-moderate aortic valve stenosis -Patient euvolemic on exam  today -Continue to monitor AS as an outpatient - Continue home torsemide 40 mg BID, spironolactone 25 mg daily   Chronic chest pain Nonobstructive CAD -Cath in 2019 showed minimal, nonobstructive CAD -Patient has reported a 4-5-year history of chest pain that has been atypical.  Dobutamine stress echo in 07/2022 showed evidence of ischemia.  However, his cardiologist at [facility] did not think that patient had concerning symptoms and recommended deferring further ischemic evaluation -In the ED here, patient's high-sensitivity troponin has been negative x 2 in the setting of tachycardia.  In a way, patient has passed a stress test.  He gets a bit of chest discomfort when his heart rate becomes elevated, but denies chest pain otherwise. -No further ischemic evaluation needed at this time -Continue Lipitor 80 mg daily, Imdur 30 mg daily, Plavix 75 mg daily -If symptoms do not resolve with restoring sinus rhythm, we may then want to consider additional ischemic evaluation, for now we will hold off.   Risk Assessment/Risk Scores:    CHA2DS2-VASc Score = 6  This indicates a 9.7% annual risk of stroke. The patient's score is based upon: CHF History: 1 HTN History: 1 Diabetes History: 1 Stroke History: 2 Vascular Disease History: 0 Age Score: 1 Gender Score: 0        For questions or updates, please contact [facility] Please consult [email or web address] for contact info under    Signed, Jonita Albee, PA-C  08/25/2023 4:17 PM    ATTENDING ATTESTATION  I have seen, examined and evaluated the patient this evening in consultation along with [facility], PA.  After reviewing all the available data and chart, we discussed the patients laboratory, study & physical findings as well as symptoms in detail.  I agree with her findings, examination as well as impression recommendations as per our discussion.    Attending adjustments noted in italics.   Patient with apparent  longstanding PAF ostensibly on amiodarone for rhythm control and presents with what sounds like 3 weeks of persistent A-fib that is somewhat symptomatic.  He has been on amiodarone long-term for rhythm control which has been relatively effective until recently with his episode.  He has had previous ischemic evaluations of abnormal and I think his symptoms are probably related to the arrhythmia.  Therefore plan is to try to restore sinus rhythm and reassess symptoms prior to determining further evaluation. Will give additional amiodarone 200 mg this evening and add additional 200 mg dose to the standing dose of 200 mg for the morning.  We will attempt to get him scheduled for DCCV as he has been on uninterrupted DAPT.  We will follow all after.  I think agreeable to restore sinus rhythm and his symptoms improved, would strongly recommend that he follow-up with the EP through his primary cardiologist to discuss A-fib ablation which would then preclude the need for long-term amiodarone use.    Marykay Lex, MD, MS Bryan Lemma, M.D., M.S. Interventional Cardiologist  [facility]  Pager # [phone] Phone # [phone] [address] [place], [place] [address]

## 2023-08-25 NOTE — ED Notes (Signed)
 Pt and wife aware of pending discharge and give verbal consent

## 2023-08-25 NOTE — Progress Notes (Signed)
 Arrived to [facility] 6E Room [phone].  Placed on cardiac monitor and vital signs obtained.     08/25/23 1529  Vitals  Temp 98.2 F (36.8 C)  Temp Source Oral  BP 123/78  MAP (mmHg) 91  BP Location Left Arm  BP Method Automatic  Patient Position (if appropriate) Lying  Pulse Rate (!) 108  Pulse Rate Source Monitor  ECG Heart Rate (!) 107  Resp 19  Level of Consciousness  Level of Consciousness Alert  MEWS COLOR  MEWS Score Color Green  Oxygen Therapy  SpO2 100 %  O2 Device Nasal Cannula  O2 Flow Rate (L/min) 2 L/min  Pain Assessment  Pain Scale 0-10  Pain Score 9  Pain Type Chronic pain  Pain Location Back  Pain Orientation Lower  Pain Descriptors / Indicators Aching  Patients Stated Pain Goal 7  Pain Intervention(s) Repositioned (awaiting MD to see patient and complete admission orders; no current PRNs for pain)  MEWS Score  MEWS Temp 0  MEWS Systolic 0  MEWS Pulse 1  MEWS RR 0  MEWS LOC 0  MEWS Score 1

## 2023-08-25 NOTE — ED Notes (Signed)
 [facility] in to transport and verbal report given to [facility]

## 2023-08-26 ENCOUNTER — Inpatient Hospital Stay (HOSPITAL_COMMUNITY): Payer: Self-pay | Admitting: Anesthesiology

## 2023-08-26 ENCOUNTER — Other Ambulatory Visit (HOSPITAL_COMMUNITY): Payer: Self-pay

## 2023-08-26 ENCOUNTER — Encounter (HOSPITAL_COMMUNITY)
Admission: EM | Disposition: A | Discharge status: Home / Self Care | Payer: Self-pay | Source: Home / Self Care | Attending: Internal Medicine

## 2023-08-26 ENCOUNTER — Encounter (HOSPITAL_COMMUNITY): Payer: Self-pay | Admitting: Internal Medicine

## 2023-08-26 DIAGNOSIS — J449 Chronic obstructive pulmonary disease, unspecified: Secondary | ICD-10-CM

## 2023-08-26 DIAGNOSIS — I4891 Unspecified atrial fibrillation: Secondary | ICD-10-CM

## 2023-08-26 DIAGNOSIS — E119 Type 2 diabetes mellitus without complications: Secondary | ICD-10-CM

## 2023-08-26 DIAGNOSIS — I2510 Atherosclerotic heart disease of native coronary artery without angina pectoris: Secondary | ICD-10-CM

## 2023-08-26 HISTORY — PX: CARDIOVERSION: EP1203

## 2023-08-26 LAB — CBC
HCT: 34.6 % — ABNORMAL LOW (ref 39.0–52.0)
Hemoglobin: 10.3 g/dL — ABNORMAL LOW (ref 13.0–17.0)
MCH: 22.7 pg — ABNORMAL LOW (ref 26.0–34.0)
MCHC: 29.8 g/dL — ABNORMAL LOW (ref 30.0–36.0)
MCV: 76.4 fL — ABNORMAL LOW (ref 80.0–100.0)
Platelets: 379 10*3/uL (ref 150–400)
RBC: 4.53 MIL/uL (ref 4.22–5.81)
RDW: 17.1 % — ABNORMAL HIGH (ref 11.5–15.5)
WBC: 13.8 10*3/uL — ABNORMAL HIGH (ref 4.0–10.5)
nRBC: 0 % (ref 0.0–0.2)

## 2023-08-26 LAB — GLUCOSE, CAPILLARY
Glucose-Capillary: 155 mg/dL — ABNORMAL HIGH (ref 70–99)
Glucose-Capillary: 141 mg/dL — ABNORMAL HIGH (ref 70–99)

## 2023-08-26 LAB — COMPREHENSIVE METABOLIC PANEL
ALT: 29 U/L (ref 0–44)
AST: 21 U/L (ref 15–41)
Albumin: 2.9 g/dL — ABNORMAL LOW (ref 3.5–5.0)
Alkaline Phosphatase: 81 U/L (ref 38–126)
Anion gap: 6 (ref 5–15)
BUN: 16 mg/dL (ref 8–23)
CO2: 27 mmol/L (ref 22–32)
Calcium: 8.6 mg/dL — ABNORMAL LOW (ref 8.9–10.3)
Chloride: 107 mmol/L (ref 98–111)
Creatinine, Ser: 1.58 mg/dL — ABNORMAL HIGH (ref 0.61–1.24)
GFR, Estimated: 46 mL/min — ABNORMAL LOW (ref >60)
Glucose, Bld: 199 mg/dL — ABNORMAL HIGH (ref 70–99)
Potassium: 3.1 mmol/L — ABNORMAL LOW (ref 3.5–5.1)
Sodium: 140 mmol/L (ref 135–145)
Total Bilirubin: 0.6 mg/dL (ref 0.0–1.2)
Total Protein: 6.7 g/dL (ref 6.5–8.1)

## 2023-08-26 SURGERY — CARDIOVERSION (CATH LAB)
Anesthesia: General

## 2023-08-26 MED ORDER — POTASSIUM CHLORIDE CRYS ER 20 MEQ PO TBCR
EXTENDED_RELEASE_TABLET | ORAL | Status: AC
  Filled 2023-08-26: qty 2

## 2023-08-26 MED ORDER — AMOXICILLIN-POT CLAVULANATE 875-125 MG PO TABS
1.0000 | ORAL_TABLET | Freq: Two times a day (BID) | ORAL | 0 refills | Status: AC
  Filled 2023-08-26: qty 14, 7d supply, fill #0

## 2023-08-26 MED ORDER — METOPROLOL TARTRATE 25 MG PO TABS
25.0000 mg | ORAL_TABLET | Freq: Two times a day (BID) | ORAL | 0 refills | Status: DC
  Filled 2023-08-26: qty 60, 30d supply, fill #0

## 2023-08-26 MED ORDER — AMIODARONE HCL 200 MG PO TABS
200.0000 mg | ORAL_TABLET | Freq: Two times a day (BID) | ORAL | Status: DC
  Administered 2023-08-26: 200 mg via ORAL
  Filled 2023-08-26: qty 1

## 2023-08-26 MED ORDER — LIDOCAINE 2% (20 MG/ML) 5 ML SYRINGE
INTRAMUSCULAR | Status: DC | PRN
  Administered 2023-08-26: 60 mg via INTRAVENOUS

## 2023-08-26 MED ORDER — POTASSIUM CHLORIDE 10 MEQ/100ML IV SOLN
INTRAVENOUS | Status: AC
  Filled 2023-08-26: qty 100

## 2023-08-26 MED ORDER — AMIODARONE HCL 200 MG PO TABS
ORAL_TABLET | ORAL | 0 refills | Status: DC
Start: 2023-08-26 — End: 2024-01-05
  Filled 2023-08-26: qty 74, 30d supply, fill #0

## 2023-08-26 MED ORDER — PROPOFOL 10 MG/ML IV BOLUS
INTRAVENOUS | Status: DC | PRN
  Administered 2023-08-26: 80 mg via INTRAVENOUS

## 2023-08-26 MED ORDER — POTASSIUM CHLORIDE CRYS ER 20 MEQ PO TBCR
40.0000 meq | EXTENDED_RELEASE_TABLET | ORAL | Status: AC
  Administered 2023-08-26: 40 meq via ORAL
  Filled 2023-08-26: qty 2

## 2023-08-26 SURGICAL SUPPLY — 1 items: PAD DEFIB RADIO PHYSIO CONN (PAD) ×1 IMPLANT

## 2023-08-26 NOTE — CV Procedure (Signed)
   DIRECT CURRENT CARDIOVERSION  NAME:  Mike Hunter    MRN: [id number] DOB:  06/07/1951    ADMIT DATE: 08/24/2023  Indication:  Symptomatic atrial fibrillation   Procedure Note:  The patient signed informed consent.  They have had had therapeutic anticoagulation with xarelto greater than 3 weeks.  Anesthesia was administered by Dr. Freida Busman.  Adequate airway was maintained throughout and vital followed per protocol.  They were cardioverted x 1 with 200J of biphasic synchronized energy.  They converted to NSR.  There were no apparent complications.  The patient had normal neuro status and respiratory status post procedure with vitals stable as recorded elsewhere.    Follow up: They will continue on current medical therapy and follow up with cardiology as scheduled.  Gerri Spore T. Flora Lipps, MD, [name]  [facility]  [address], [address] [place], [place] [address] [phone]  8:22 AM

## 2023-08-26 NOTE — Anesthesia Postprocedure Evaluation (Signed)
 Anesthesia Post Note  Patient: Mike Hunter  Procedure(s) Performed: CARDIOVERSION     Patient location during evaluation: PACU Anesthesia Type: General Level of consciousness: awake Pain management: pain level controlled Vital Signs Assessment: post-procedure vital signs reviewed and stable Respiratory status: spontaneous breathing, nonlabored ventilation and respiratory function stable Cardiovascular status: blood pressure returned to baseline and stable Postop Assessment: no apparent nausea or vomiting Anesthetic complications: no   There were no known notable events for this encounter.  Last Vitals:  Vitals:   08/26/23 0840 08/26/23 0845  BP: (!) 107/55 (!) 110/56  Pulse: 64 66  Resp: 17 16  Temp:    SpO2: 97% 95%    Last Pain:  Vitals:   08/26/23 0830  TempSrc:   PainSc: 0-No pain                 Linton Rump

## 2023-08-26 NOTE — Addendum Note (Signed)
 Addendum  created 08/26/23 1141 by Linton Rump, MD   Clinical Note Signed

## 2023-08-26 NOTE — Transfer of Care (Signed)
 Immediate Anesthesia Transfer of Care Note  Patient: Mike Hunter  Procedure(s) Performed: CARDIOVERSION  Patient Location: Cath Lab  Anesthesia Type:MAC  Level of Consciousness: awake, alert , and oriented  Airway & Oxygen Therapy: Patient Spontanous Breathing and Patient connected to nasal cannula oxygen  Post-op Assessment: Report given to RN and Post -op Vital signs reviewed and stable  Post vital signs: Reviewed and stable  Last Vitals:  Vitals Value Taken Time  BP 89/53 0822  Temp    Pulse 62   Resp 20   SpO2 97     Last Pain:  Vitals:   08/26/23 0749  TempSrc: Temporal  PainSc:       Patients Stated Pain Goal: 7 (08/25/23 1529)  Complications: No notable events documented.

## 2023-08-26 NOTE — Discharge Summary (Signed)
 Physician Discharge Summary  Mike Hunter ZOX:[id number] DOB: 09/09/1950 DOA: 08/24/2023  PCP: Lenox Ponds, MD  Admit date: 08/24/2023 Discharge date: 08/26/2023 30 Day Unplanned Readmission Risk Score    Flowsheet Row ED to Hosp-Admission (Current) from 08/24/2023 in [facility]  30 Day Unplanned Readmission Risk Score (%) 29.66 Filed at 08/26/2023 1200       This score is the patient's risk of an unplanned readmission within 30 days of being discharged (0 -100%). The score is based on dignosis, age, lab data, medications, orders, and past utilization.   Low:  0-14.9   Medium: 15-21.9   High: 22-29.9   Extreme: 30 and above          Admitted From: Home Disposition: Home  Recommendations for Outpatient Follow-up:  Follow up with PCP in 1-2 weeks Please obtain BMP/CBC in one week With your primary cardiologist in 1 to 2 weeks. Follow-up with EP cardiology as well. Please follow up with your PCP on the following pending results: Unresulted Labs (From admission, onward)    None         Home Health: None Equipment/Devices: None  Discharge Condition: Stable CODE STATUS: Full code Diet recommendation: Cardiac  Subjective: Seen and examined after DCCV.  Wife at the bedside.  Patient was feeling well with no complaints at all.  Brief/Interim Summary: Mike Hunter is a 73 y.o. male with medical history significant of asthma, T2DM, class II obesity, HLD, HTN, GERD, COPD on 1 L [place] at night, OSA on CPAP, AAA, left carotid stenosis s/p stent placement on 12/19, CKD 3A, paroxysmal A-fib on Xarelto, BPH, urge incontinence, bipolar disorder, anxiety and depression, episodic mood disorder, CAD, non-rheumatic aortic valve stenosis, osteoarthritis and chronic diastolic HF presented to the ED at [facility] on 3/10 complaining of palpitations that have been ongoing for 2-3 weeks.  Patient was taking both Plavix and Xarelto. Initial vital signs in the ED  showed HR 125 BPM, BP 122/82, oxygen 100% on room air. EKG showed atrial flutter with HR 114 BPM. CXR showed no active cardiopulmonary disease. COVID, flu, RSV negative. TSH within normal limits. hsTn negative x2. K 3.4, creatinine 2.15, WBC 9.5, hemoglobin 10.5, platelets 402. CTA chest showed no PE, scattered areas of ground-glass opacity in both lower lobes, suspicious for pneumonitis/pneumonia.  Patient was admitted under hospital service for atrial fibrillation with RVR.  He was initiated on diltiazem and amiodarone as well as metoprolol started.  Continued on Xarelto.  Cardiology consulted.  Patient eventually underwent DCCV 08/26/2023, this was successful, patient was monitored for few hours and remained in sinus rhythm after procedure.  Patient was then reassessed by cardiology and cleared for discharge.  Patient sees cardiology at [facility], he is advised to follow-up with his primary cardiologist and seek consultation with EP cardiology for possible ablation consideration.  For his pneumonia, he was started on Rocephin and Zithromax and is going to be discharged on Augmentin.   Discharge plan was discussed with patient and/or family member and they verbalized understanding and agreed with it.  Discharge Diagnoses:  Principal Problem:   Atrial fibrillation with rapid ventricular response (HCC) Active Problems:   Insulin-requiring or dependent type II diabetes mellitus (HCC)   Esophageal reflux   Acute kidney injury superimposed on stage 3b chronic kidney disease (HCC)   Hypokalemia   CAD (coronary artery disease)   CAP (community acquired pneumonia)   Benign prostatic hyperplasia   Anemia due to stage  3b chronic kidney disease (HCC)    Discharge Instructions   Allergies as of 08/26/2023       Reactions   Buprenorphine Hcl Nausea And Vomiting   Other reaction(s): Nausea And Vomiting   Hydromorphone Other (See Comments)   Other reaction(s): Mental Status Changes  (intolerance) Other reaction(s): HALLUCINATIONS hallucinations hallucinations hallucinations hallucinations   Codeine Nausea And Vomiting   Mirtazapine    Other reaction(s): Other (See Comments) Nightmares & altered mental status   Morphine And Codeine Nausea And Vomiting        Medication List     STOP taking these medications    atorvastatin 40 MG tablet Commonly known as: LIPITOR       TAKE these medications    albuterol 108 (90 Base) MCG/ACT inhaler Commonly known as: VENTOLIN HFA Inhale 2 puffs into the lungs every 4 (four) hours as needed for wheezing.   amiodarone 200 MG tablet Commonly known as: PACERONE Take 2 tablets (400 mg total) by mouth 2 (two) times daily for 7 days, THEN 1 tablet (200 mg total) 2 (two) times daily for 23 days. Start taking on: August 26, 2023 What changed: See the new instructions.   amoxicillin-clavulanate 875-125 MG tablet Commonly known as: AUGMENTIN Take 1 tablet by mouth 2 (two) times daily for 7 days.   budesonide 0.5 MG/2ML nebulizer solution Commonly known as: PULMICORT Take 2 mLs by nebulization 2 (two) times daily.   budesonide-formoterol 160-4.5 MCG/ACT inhaler Commonly known as: SYMBICORT Inhale 2 puffs into the lungs 2 (two) times daily.   clopidogrel 75 MG tablet Commonly known as: PLAVIX Take 1 tablet by mouth daily.   dextromethorphan-guaiFENesin 30-600 MG 12hr tablet Commonly known as: MUCINEX DM Take 1 tablet by mouth 2 (two) times daily. What changed:  when to take this reasons to take this   donepezil 10 MG tablet Commonly known as: ARICEPT Take 10 mg by mouth at bedtime.   eszopiclone 2 MG Tabs tablet Commonly known as: LUNESTA Take 2 mg by mouth at bedtime as needed for sleep.   isosorbide mononitrate 30 MG 24 hr tablet Commonly known as: IMDUR Take 1 tablet by mouth daily.   memantine 5 MG tablet Commonly known as: NAMENDA Take 5 mg by mouth 2 (two) times daily.   metoprolol tartrate 25  MG tablet Commonly known as: LOPRESSOR Take 1 tablet (25 mg total) by mouth 2 (two) times daily.   mirabegron ER 50 MG Tb24 tablet Commonly known as: MYRBETRIQ Take 1 tablet by mouth daily.   montelukast 10 MG tablet Commonly known as: SINGULAIR Take 10 mg by mouth at bedtime.   multivitamin with minerals Tabs tablet Take 1 tablet by mouth daily.   NovoLOG FlexPen 100 UNIT/ML injection Generic drug: insulin aspart Inject 26-44 Units into the skin See admin instructions. 5 units prior to meals for cbg>= 150  Sliding scale (base is 26u in the AM, 30u at noon, and  36-44 at night   omeprazole 40 MG capsule Commonly known as: PRILOSEC Take 1 capsule by mouth at bedtime.   oxybutynin 10 MG 24 hr tablet Commonly known as: DITROPAN-XL Take 10 mg by mouth daily.   PRESERVISION AREDS PO Take 1 tablet by mouth daily.   sertraline 50 MG tablet Commonly known as: ZOLOFT Take 50 mg by mouth every morning.   spironolactone 25 MG tablet Commonly known as: ALDACTONE Take 25 mg by mouth daily.   torsemide 20 MG tablet Commonly known as: DEMADEX Take 20-40 mg  by mouth See admin instructions. Take two tablets by mouth in the morning and then take one tablet in the evening per patient and wife   TRESIBA FLEXTOUCH [place] Inject 40-50 Units into the skin See admin instructions. 40 morning, 50 night   Wixela Inhub 250-50 MCG/ACT Aepb Generic drug: fluticasone-salmeterol Inhale 1 puff into the lungs in the morning and at bedtime.   Xalatan 0.005 % ophthalmic solution Generic drug: latanoprost Place 1 drop into both eyes at bedtime.   Xarelto 20 MG Tabs tablet Generic drug: rivaroxaban Take 20 mg by mouth daily with supper.        Follow-up Information     Randel Pigg, Dorma Russell, MD Follow up in 1 week(s).   Specialty: Family Medicine Contact information: [phone] [place] [address] [phone]                Allergies  Allergen Reactions    Buprenorphine Hcl Nausea And Vomiting    Other reaction(s): Nausea And Vomiting    Hydromorphone Other (See Comments)    Other reaction(s): Mental Status Changes (intolerance) Other reaction(s): HALLUCINATIONS hallucinations hallucinations hallucinations hallucinations    Codeine Nausea And Vomiting   Mirtazapine     Other reaction(s): Other (See Comments) Nightmares & altered mental status    Morphine And Codeine Nausea And Vomiting    Consultations: Cardiology   Procedures/Studies: EP STUDY Result Date: 08/26/2023 See surgical note for result.  CT Angio Chest PE W and/or Wo Contrast Result Date: 08/24/2023 CLINICAL DATA:  Pulmonary embolism (PE) suspected, high prob Chest pain, shortness of breath and palpitations. EXAM: CT ANGIOGRAPHY CHEST WITH CONTRAST TECHNIQUE: Multidetector CT imaging of the chest was performed using the standard protocol during bolus administration of intravenous contrast. Multiplanar CT image reconstructions and MIPs were obtained to evaluate the vascular anatomy. RADIATION DOSE REDUCTION: This exam was performed according to the departmental dose-optimization program which includes automated exposure control, adjustment of the mA and/or kV according to patient size and/or use of iterative reconstruction technique. CONTRAST:  75mL OMNIPAQUE IOHEXOL 350 MG/ML SOLN COMPARISON:  Radiograph earlier today, chest CT 01/30/2023 FINDINGS: Cardiovascular: There are no filling defects within the pulmonary arteries to suggest pulmonary embolus. The heart is upper normal in size. Trace pericardial fluid adjacent to the left ventricle. Coronary artery calcifications. Mild aortic atherosclerosis. No aortic aneurysm. 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 Mediastinum/Nodes: No enlarged mediastinal or hilar lymph nodes. Tiny hiatal hernia. Otherwise unremarkable esophagus. No thyroid nodule. Lungs/Pleura: Scattered areas of ground-glass opacity in  both lower lobes, with faint tree-in-bud opacity in the left upper lobe. No pleural fluid. The trachea and central airways are clear. Upper Abdomen: No acute findings. Left renal cyst. No further follow-up imaging is recommended. Musculoskeletal: T10 compression fracture has progressed from 06/24/2023 thoracic spine CT with increased loss of height superiorly and decreased density in the left aspect of the central vertebral body. Mild inferior T8 compression fracture is unchanged. Mild superior T3 compression fracture is unchanged. Review of the MIP images confirms the above findings. IMPRESSION: 1. No pulmonary embolus. 2. Scattered areas of ground-glass opacity in both lower lobes, with faint tree-in-bud opacity in the left upper lobe. Findings are suspicious for pneumonitis, including atypical infection. 3. T10 compression fracture has progressed from 06/24/2023 thoracic spine CT with increased loss of height. Aortic Atherosclerosis (ICD10-I70.0). Electronically Signed   By: Narda Rutherford M.D.   On: 08/24/2023 19:56   DG Chest 2 View Result Date: 08/24/2023  CLINICAL DATA:  Chest pain. Shortness of breath. Palpitations for 2-3 weeks. EXAM: CHEST - 2 VIEW COMPARISON:  Chest radiographs 06/24/2023, CT chest, abdomen, and pelvis 01/30/2023 FINDINGS: Cardiac silhouette and mediastinal contours are within normal limits. Moderate calcification within the aortic arch. Flattening of the diaphragms and mild hyperinflation. Flattening of the diaphragms and mild hyperinflation. Mild multilevel degenerative disc changes of the thoracic spine, unchanged. Mild anterior wedging of the T7 vertebral body is unchanged from prior radiographs and CT. IMPRESSION: 1. No active cardiopulmonary disease. 2. Mild hyperinflation. Electronically Signed   By: Neita Garnet M.D.   On: 08/24/2023 18:01     Discharge Exam: Vitals:   08/26/23 1202 08/26/23 1242  BP:  100/69  Pulse:    Resp: 18 18  Temp:  98 F (36.7 C)  SpO2:  98%    Vitals:   08/26/23 0845 08/26/23 1013 08/26/23 1202 08/26/23 1242  BP: (!) 110/56 (!) 111/59  100/69  Pulse: 66 72    Resp: 16  18 18   Temp:    98 F (36.7 C)  TempSrc:    Oral  SpO2: 95%  98%   Weight:      Height:        General: Pt is alert, awake, not in acute distress Cardiovascular: RRR, S1/S2 +, no rubs, no gallops Respiratory: CTA bilaterally, no wheezing, no rhonchi Abdominal: Soft, NT, ND, bowel sounds + Extremities: no edema, no cyanosis    The results of significant diagnostics from this hospitalization (including imaging, microbiology, ancillary and laboratory) are listed below for reference.     Microbiology: Recent Results (from the past 240 hours)  Resp panel by RT-PCR (RSV, Flu A&B, Covid) Anterior Nasal Swab     Status: None   Collection Time: 08/24/23  4:14 PM   Specimen: Anterior Nasal Swab  Result Value Ref Range Status   SARS Coronavirus 2 by RT PCR NEGATIVE NEGATIVE Final    Comment: (NOTE) SARS-CoV-2 target nucleic acids are NOT DETECTED.  The SARS-CoV-2 RNA is generally detectable in upper respiratory specimens during the acute phase of infection. The lowest concentration of SARS-CoV-2 viral copies this assay can detect is 138 copies/mL. A negative result does not preclude SARS-Cov-2 infection and should not be used as the sole basis for treatment or other patient management decisions. A negative result may occur with  improper specimen collection/handling, submission of specimen other than nasopharyngeal swab, presence of viral mutation(s) within the areas targeted by this assay, and inadequate number of viral copies(<138 copies/mL). A negative result must be combined with clinical observations, patient history, and epidemiological information. The expected result is Negative.  Fact Sheet for Patients:  BloggerCourse.com  Fact Sheet for Healthcare Providers:  SeriousBroker.it  This  test is no t yet approved or cleared by the [place] FDA and  has been authorized for detection and/or diagnosis of SARS-CoV-2 by FDA under an Emergency Use Authorization (EUA). This EUA will remain  in effect (meaning this test can be used) for the duration of the COVID-19 declaration under Section 564(b)(1) of the Act, 21 U.S.C.section 360bbb-3(b)(1), unless the authorization is terminated  or revoked sooner.       Influenza A by PCR NEGATIVE NEGATIVE Final   Influenza B by PCR NEGATIVE NEGATIVE Final    Comment: (NOTE) The Xpert Xpress SARS-CoV-2/FLU/RSV plus assay is intended as an aid in the diagnosis of influenza from Nasopharyngeal swab specimens and should not be used as a sole basis for treatment. Nasal washings  and aspirates are unacceptable for Xpert Xpress SARS-CoV-2/FLU/RSV testing.  Fact Sheet for Patients: BloggerCourse.com  Fact Sheet for Healthcare Providers: SeriousBroker.it  This test is not yet approved or cleared by the [place] FDA and has been authorized for detection and/or diagnosis of SARS-CoV-2 by FDA under an Emergency Use Authorization (EUA). This EUA will remain in effect (meaning this test can be used) for the duration of the COVID-19 declaration under Section 564(b)(1) of the Act, 21 U.S.C. section 360bbb-3(b)(1), unless the authorization is terminated or revoked.     Resp Syncytial Virus by PCR NEGATIVE NEGATIVE Final    Comment: (NOTE) Fact Sheet for Patients: BloggerCourse.com  Fact Sheet for Healthcare Providers: SeriousBroker.it  This test is not yet approved or cleared by the [place] FDA and has been authorized for detection and/or diagnosis of SARS-CoV-2 by FDA under an Emergency Use Authorization (EUA). This EUA will remain in effect (meaning this test can be used) for the duration of the COVID-19 declaration under  Section 564(b)(1) of the Act, 21 U.S.C. section 360bbb-3(b)(1), unless the authorization is terminated or revoked.  Performed at [facility], [address] [place], [place] [address]      Labs: BNP (last 3 results) Recent Labs    06/06/23 1218 08/24/23 1431  BNP 485.2* 177.1*   Basic Metabolic Panel: Recent Labs  Lab 08/24/23 1431 08/25/23 1544 08/26/23 0400  NA 138 139 140  K 3.4* 3.4* 3.1*  CL 101 104 107  CO2 28 23 27   GLUCOSE 135* 225* 199*  BUN 26* 17 16  CREATININE 2.15* 1.55* 1.58*  CALCIUM 8.6* 8.7* 8.6*  MG 2.2 2.3  --   PHOS  --  3.1  --    Liver Function Tests: Recent Labs  Lab 08/24/23 1431 08/25/23 1544 08/26/23 0400  AST 25 26 21   ALT 29 32 29  ALKPHOS 85 79 81  BILITOT 0.6 0.5 0.6  PROT 7.4 7.0 6.7  ALBUMIN 3.4* 3.1* 2.9*   Recent Labs  Lab 08/24/23 1431  LIPASE 36   No results for input(s): "AMMONIA" in the last 168 hours. CBC: Recent Labs  Lab 08/24/23 1431 08/25/23 1544 08/26/23 0400  WBC 9.5 11.2* 13.8*  NEUTROABS  --  9.1*  --   HGB 10.5* 10.8* 10.3*  HCT 35.2* 35.7* 34.6*  MCV 77.2* 76.8* 76.4*  PLT 402* 376 379   Cardiac Enzymes: No results for input(s): "CKTOTAL", "CKMB", "CKMBINDEX", "TROPONINI" in the last 168 hours. BNP: Invalid input(s): "POCBNP" CBG: Recent Labs  Lab 08/25/23 1213 08/25/23 1618 08/25/23 2120 08/26/23 0810 08/26/23 1236  GLUCAP 134* 212* 188* 155* 141*   D-Dimer No results for input(s): "DDIMER" in the last 72 hours. Hgb A1c Recent Labs    08/25/23 1544  HGBA1C 7.3*   Lipid Profile No results for input(s): "CHOL", "HDL", "LDLCALC", "TRIG", "CHOLHDL", "LDLDIRECT" in the last 72 hours. Thyroid function studies Recent Labs    08/25/23 1544  TSH 0.891   Anemia work up No results for input(s): "VITAMINB12", "FOLATE", "FERRITIN", "TIBC", "IRON", "RETICCTPCT" in the last 72 hours. Urinalysis    Component Value Date/Time   COLORURINE YELLOW 08/24/2023 1619    APPEARANCEUR CLEAR 08/24/2023 1619   LABSPEC 1.010 08/24/2023 1619   PHURINE 6.0 08/24/2023 1619   GLUCOSEU >=500 (A) 08/24/2023 1619   HGBUR NEGATIVE 08/24/2023 1619   BILIRUBINUR NEGATIVE 08/24/2023 1619   KETONESUR NEGATIVE 08/24/2023 1619   PROTEINUR NEGATIVE 08/24/2023 1619   UROBILINOGEN 0.2 11/01/2014 1530  NITRITE NEGATIVE 08/24/2023 1619   LEUKOCYTESUR TRACE (A) 08/24/2023 1619   Sepsis Labs Recent Labs  Lab 08/24/23 1431 08/25/23 1544 08/26/23 0400  WBC 9.5 11.2* 13.8*   Microbiology Recent Results (from the past 240 hours)  Resp panel by RT-PCR (RSV, Flu A&B, Covid) Anterior Nasal Swab     Status: None   Collection Time: 08/24/23  4:14 PM   Specimen: Anterior Nasal Swab  Result Value Ref Range Status   SARS Coronavirus 2 by RT PCR NEGATIVE NEGATIVE Final    Comment: (NOTE) SARS-CoV-2 target nucleic acids are NOT DETECTED.  The SARS-CoV-2 RNA is generally detectable in upper respiratory specimens during the acute phase of infection. The lowest concentration of SARS-CoV-2 viral copies this assay can detect is 138 copies/mL. A negative result does not preclude SARS-Cov-2 infection and should not be used as the sole basis for treatment or other patient management decisions. A negative result may occur with  improper specimen collection/handling, submission of specimen other than nasopharyngeal swab, presence of viral mutation(s) within the areas targeted by this assay, and inadequate number of viral copies(<138 copies/mL). A negative result must be combined with clinical observations, patient history, and epidemiological information. The expected result is Negative.  Fact Sheet for Patients:  BloggerCourse.com  Fact Sheet for Healthcare Providers:  SeriousBroker.it  This test is no t yet approved or cleared by the [place] FDA and  has been authorized for detection and/or diagnosis of SARS-CoV-2 by FDA  under an Emergency Use Authorization (EUA). This EUA will remain  in effect (meaning this test can be used) for the duration of the COVID-19 declaration under Section 564(b)(1) of the Act, 21 U.S.C.section 360bbb-3(b)(1), unless the authorization is terminated  or revoked sooner.       Influenza A by PCR NEGATIVE NEGATIVE Final   Influenza B by PCR NEGATIVE NEGATIVE Final    Comment: (NOTE) The Xpert Xpress SARS-CoV-2/FLU/RSV plus assay is intended as an aid in the diagnosis of influenza from Nasopharyngeal swab specimens and should not be used as a sole basis for treatment. Nasal washings and aspirates are unacceptable for Xpert Xpress SARS-CoV-2/FLU/RSV testing.  Fact Sheet for Patients: BloggerCourse.com  Fact Sheet for Healthcare Providers: SeriousBroker.it  This test is not yet approved or cleared by the [place] FDA and has been authorized for detection and/or diagnosis of SARS-CoV-2 by FDA under an Emergency Use Authorization (EUA). This EUA will remain in effect (meaning this test can be used) for the duration of the COVID-19 declaration under Section 564(b)(1) of the Act, 21 U.S.C. section 360bbb-3(b)(1), unless the authorization is terminated or revoked.     Resp Syncytial Virus by PCR NEGATIVE NEGATIVE Final    Comment: (NOTE) Fact Sheet for Patients: BloggerCourse.com  Fact Sheet for Healthcare Providers: SeriousBroker.it  This test is not yet approved or cleared by the [place] FDA and has been authorized for detection and/or diagnosis of SARS-CoV-2 by FDA under an Emergency Use Authorization (EUA). This EUA will remain in effect (meaning this test can be used) for the duration of the COVID-19 declaration under Section 564(b)(1) of the Act, 21 U.S.C. section 360bbb-3(b)(1), unless the authorization is terminated or revoked.  Performed at [facility], [address] [place], [place] [address]     FURTHER DISCHARGE INSTRUCTIONS:   Get Medicines reviewed and adjusted: Please take all your medications with you for your next visit with your Primary MD   Laboratory/radiological data: Please request your Primary MD to  go over all hospital tests and procedure/radiological results at the follow up, please ask your Primary MD to get all Hospital records sent to his/her office.   In some cases, they will be blood work, cultures and biopsy results pending at the time of your discharge. Please request that your primary care M.D. goes through all the records of your hospital data and follows up on these results.   Also Note the following: If you experience worsening of your admission symptoms, develop shortness of breath, life threatening emergency, suicidal or homicidal thoughts you must seek medical attention immediately by calling 911 or calling your MD immediately  if symptoms less severe.   You must read complete instructions/literature along with all the possible adverse reactions/side effects for all the Medicines you take and that have been prescribed to you. Take any new Medicines after you have completely understood and accpet all the possible adverse reactions/side effects.    Do not drive when taking Pain medications or sleeping medications (Benzodaizepines)   Do not take more than prescribed Pain, Sleep and Anxiety Medications. It is not advisable to combine anxiety,sleep and pain medications without talking with your primary care practitioner   Special Instructions: If you have smoked or chewed Tobacco  in the last 2 yrs please stop smoking, stop any regular Alcohol  and or any Recreational drug use.   Wear Seat belts while driving.   Please note: You were cared for by a hospitalist during your hospital stay. Once you are discharged, your primary care physician will handle any further medical issues. Please note that  NO REFILLS for any discharge medications will be authorized once you are discharged, as it is imperative that you return to your primary care physician (or establish a relationship with a primary care physician if you do not have one) for your post hospital discharge needs so that they can reassess your need for medications and monitor your lab values  Time coordinating discharge: Over 30 minutes  SIGNED:   Hughie Closs, MD  [facility] 08/26/2023, 2:47 PM *Please note that this is a verbal dictation therefore any spelling or grammatical errors are due to the "Dragon Medical One" system interpretation. If 7PM-7AM, please contact night-coverage [email or web address]

## 2023-08-26 NOTE — Interval H&P Note (Signed)
 History and Physical Interval Note:  08/26/2023 8:06 AM  Mike Hunter  has presented today for surgery, with the diagnosis of afib.  The various methods of treatment have been discussed with the patient and family. After consideration of risks, benefits and other options for treatment, the patient has consented to  Procedure(s): CARDIOVERSION (N/A) as a surgical intervention.  The patient's history has been reviewed, patient examined, no change in status, stable for surgery.  I have reviewed the patient's chart and labs.  Questions were answered to the patient's satisfaction.    NPO for DCCV. In atrial fibrillation. On xarelto >3 weeks. No missed doses.   Gerri Spore T. Flora Lipps, MD, [facility]  [facility]  [address] [place], [place] [address] [phone]  8:07 AM

## 2023-08-26 NOTE — Plan of Care (Signed)
  Problem: Fluid Volume: Goal: Ability to maintain a balanced intake and output will improve Outcome: Progressing   Problem: Metabolic: Goal: Ability to maintain appropriate glucose levels will improve Outcome: Progressing   Problem: Nutritional: Goal: Maintenance of adequate nutrition will improve Outcome: Progressing   Problem: Education: Goal: Knowledge of General Education information will improve Description: Including pain rating scale, medication(s)/side effects and non-pharmacologic comfort measures Outcome: Progressing   Problem: Clinical Measurements: Goal: Ability to maintain clinical measurements within normal limits will improve Outcome: Progressing Goal: Diagnostic test results will improve Outcome: Progressing Goal: Cardiovascular complication will be avoided Outcome: Progressing   Problem: Nutrition: Goal: Adequate nutrition will be maintained Outcome: Progressing   Problem: Pain Managment: Goal: General experience of comfort will improve and/or be controlled Outcome: Progressing   Problem: Safety: Goal: Ability to remain free from injury will improve Outcome: Progressing

## 2023-08-26 NOTE — Progress Notes (Signed)
 Chaplain responded to pt request for spiritual care prior to procedure.  Chaplain found pt scared and teary about what was going to happen and about the condition of his heart.  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  He told stories which lifted his spirits.  Chaplain and patient prayed together.   Vernell Morgans Chaplain

## 2023-08-26 NOTE — Progress Notes (Signed)
   Brief cardiac note:  Successful cardioversion earlier today.  Maintaining sinus rhythm.  Symptoms are notably improved.  Just some intermittent twinging sensations but no more chest discomfort. Plan was watch him til this PM to ensure still in NSR & if stable with no CP can probably d/c. Maybe reload to 200 BID Amio x 1 week then back to 200 mg daily.   Echo in late 2024 showed aortic stenosis with a mean gradient of 22 mmHg consistent with moderate AS.  [facility] will sign off.   Medication Recommendations: Continue amiodarone 200 mg twice daily (or 400 mg daily) to complete 1 week reload.  Would then return to 200 mg daily Other recommendations (labs, testing, etc): Routine surveillance labs for amiodarone per primary cardiologist Follow up as an outpatient: He has a primary cardiologist with [facility].  He has an appointment in May, but would probably benefit from follow-up in the next couple weeks.  Also recommended that he seek out EP consultation for possible A-fib ablation.     Bryan Lemma, MD

## 2023-08-27 ENCOUNTER — Encounter (HOSPITAL_COMMUNITY): Payer: Self-pay | Admitting: Cardiovascular Disease

## 2023-10-23 NOTE — Progress Notes (Signed)
 Cardiology Clinic Note  [facility] - [address] [place] [address] Dept: [phone]  REQUESTING PHYSICIAN: No ref. provider found  REASON FOR CONSULTATION: No chief complaint on file.   Portion of this note were dictated using DRAGON voices recognition software. Please disregard any errors in transcription .  ASSESSMENT AND PLAN: CAD with chest pain -c/w lipitor 80mg  nightly -Toprol  25mg  daily -positive dobutamine stress test. Still having chest pain at times. Would send for LHC at this point.   Moderate Aortic stenosis, with chronic chest pain -moderate stenosis by dobutamine echo   Paroxysmal Afib -on Xarelto  20mg  daily, toprol  25mg  daily, amiodarone  200mg  daily   HTN HFpEF -feels weak and dizzy all the time, will allow higher BP goal of less than 140/45mmHg. -on Imdur  30mg  daily, Toprol  25mg  daily, Spironolactone  25mg  daily, Torsemide  20mg  daily.    Dizziness/weakness -could be multifactorial, with anemia, COPD, carotid stenosis etc -ambulates with walker. O2 sat in clinic with ambulation >96%.   HLD 70% left ICA stenosis, s/p left ICA stent 06/04/2023 -c/w lipitor 80mg  nightly, on Xarelto    -RTC in 3 months or sooner if needed -The above plan was discussed with the patient in detail. He expresses understanding and agrees with the plan.  Thank you for the opportunity of assisting in the care of Mike Hunter.  Please do not hesitate to call if you have any questions.  Sincerely,   Lennart RAMAN. Debora, MD Cardiology  [facility] 10/23/2023  12:05 PM   HISTORY AND PRESENT ILLNESS:    Mike Hunter is a very pleasant 73 year old male with PMHx of COPD on night time O2 2-5 liters, OSA on CPAP, GERD, HTN, IDDM2, Diabetic polyneuropathy, non-obstructive CAD, paroxysmal Afib, dementia, Anemia of chronic disease, stage 3 CKD, obesity, moderate stenosis. He is here for his cardiac issues. He used to  follow up with Dr Maralee.   10/23/2023: He is here for follow up. He had positive dobutamine stress test (initially ordered for aortic stenosis evaluation), with wall motion abnormalities and chest pain. He saw Dr Toribio subsequently due to this reason and was recommended for medical mangement at this time. He still has chest pains, dyspnea with exertion, dizziness. Heart rate also varies at times from 48bpm to 90sbpm. BP dropped down to 80s one time in rehab with dizziness.  04/20/2023: He is transferring his care with me today. He ambulates with rollater and feels weak and dizzy all the time, esp with ambulation. He also has chronic chest pain which worsens with ambulation.   CORONARY RISK FACTORS:  advanced age (older than 46 for men, 23 for women), diabetes mellitus, dyslipidemia, hypertension, male gender, obesity (BMI >= 30 kg/m2), sedentary lifestyle, and smoking/ tobacco exposure   Prior Cardiac Workup: Dobutamine stress echo 05/27/2023: The patient had 12-21-08 chest pain, central tightness and heaviness.  Baseline basal inferior and  inferolateral hypokinesis became akinetic [possibly hibernating  myocardium], baseline normal  anterior wall became hypokinetic [?], likely severe stenosis of dominant  LCfx, vs RCA and Cfx  stenosis. Test stopped due to severe CP and evidence of ischemia. The  patient achieved 51 % of  maximum predicted heart rate.  AVA 1.2 cm at rest and after dobutamine with adequate increase in SV-  Moderate AS.   EKG 04/20/2023: Nsr, normal EKG   TTE 03/03/2023: Left ventricular systolic function is normal.  LV ejection fraction = 55-60%.  There are regional wall  motion abnormalities as specified below.  There is basal LV inferolateral wall hypokinesis  There is mid LV inferolateral wall hypokinesis  There is moderate to severe aortic stenosis.  Aortic valve mean pressure gradient is 19 mmHg.  Aortic valve peak pressure gradient is 32 mmHg.  The calculated  aortic valve area using the continuity equation is 0.81  cm2.  AS dimensionless index  VTI  is 0.27.  There is mild mitral regurgitation.  There is trace tricuspid regurgitation.  Trace pulmonic valvular regurgitation.  IVC size was normal.  There is no pericardial effusion.  Compared to prior study, aortic valve stenosis has worsened. Wall motion  abnormality is new.    Lexiscan Stress test 03/03/2023: Severe chest pain during Lexiscan injection. 1. No reversible ischemia or infarction.  2. Normal left ventricular wall motion.  3. Left ventricular ejection fraction 51%  4. Non invasive risk stratification*: Low   MRA neck 03/03/2023: 1. 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 Otherwise, normal brain MRI with no acute intracranial pathology. 2. Patent intracranial vasculature without significant stenosis or occlusion. 3. 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 4. Otherwise, patent vasculature of the neck with no evidence of hemodynamically significant stenosis or occlusion   LHC 01/19/2018: LMCA  Normal appearance with 0% stenosis.  LAD    Lesion on Prox LAD  10% stenosis 5 mm length .    Lesion on Mid LAD  20% stenosis 10 mm length .  LCx  Normal appearance with 0% stenosis.  RCA  Normal appearance with 0% stenosis.   Past Medical History:  Diagnosis Date  . 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   . Adjustment disorder with mixed anxiety and depressed mood   . Anemia due to stage 3b chronic kidney disease (HCC)   . Aortic atherosclerosis (HCC)   . Arteriosclerosis of coronary artery    Cardiac Cath 11/17/07 : LM normal; LAD mid 20%; LCX normal ; RCA normal - [place] non obstructive CAD  . Asthma-COPD overlap syndrome    (CMD)    O2 1 liter while sleeping  .  Astigmatism of left eye   . Benign hypertension with chronic kidney disease, stage III (HCC)   . Benign prostatic hyperplasia with urinary obstruction    S/p Laser Fulguration  . Bipolar disorder    (CMD)   . Cervical radiculopathy   . Cervical stenosis of spinal canal   . Cherry angioma   . Chronic diastolic CHF (congestive heart failure)    (CMD)   . Chronic heart failure with preserved ejection fraction (HFpEF)    (CMD)   . Chronic midline low back pain without sciatica   . Chronic nonseasonal allergic rhinitis due to pollen   . CKD (chronic kidney disease)   . Cognitive disorder   . Coronary artery disease involving native coronary artery of native heart without angina pectoris   . Decreased functional mobility and endurance   . Delayed emergence from anesthesia   . Dependence on supplemental oxygen    Home O2 1L/min [place] at night  . Depression   . Diabetes mellitus with stage 3 chronic kidney disease, with long-term current use of insulin  (HCC)   . Diabetic autonomic neuropathy (HCC)   . Diabetic polyneuropathy    (CMD)   . Diabetic retinopathy    (CMD)    NPDR  OU  . Epiretinal membrane (ERM) of right eye   . Episodic mood disorder   . Essential hypertension   . GAD (generalized anxiety disorder)   . Gastroesophageal reflux disease without esophagitis   . Glaucoma, unspecified glaucoma type, unspecified laterality    Med Hx: Glaucoma;   . Hearing loss    no hearing aids  . History of fall    several 2023  . Hypercholesterolemia   . Hyperlipidemia   . Hypertension associated with diabetes (HCC)   . Hypomagnesemia   . Impingement syndrome of right shoulder   . Iron deficiency anemia 03/24/2017   With Hemoccult positive stool.  Referred to GI oct 2018  . Irregular astigmatism of right eye   . Keratoconus of both eyes   . Left nephrolithiasis   . Mixed stress and urge urinary incontinence   . Moderate nonproliferative diabetic retinopathy of both eyes with macular edema  associated with type 2 diabetes mellitus (HCC)   . Moderate persistent asthma without complication (CMD)   . Multiple benign nevi   . Nocturnal hypoxia    ONO 08/09/21 desat below 88% 31.6 minutes     . OAB (overactive bladder)   . Obesity due to excess calories with serious comorbidity   . Obstructive sleep apnea syndrome    CPAP-16 CWP; [employer]  . Onychogryphosis   . OSA (obstructive sleep apnea)    using CPAP ~ 10-15 hours daily  . Paroxysmal atrial fibrillation (HCC)   . Peptic ulcer   . Peripheral neuropathy   . PONV (postoperative nausea and vomiting)   . Primary open angle glaucoma of both eyes, moderate stage   . Primary osteoarthritis involving multiple joints   . Pruritic dermatitis   . Pulmonary nodules/lesions, multiple    Benign by Dr. Dante (Pulmonary) 2013     . Pyogenic arthritis of knee    (CMD)   . Restrictive lung disease   . S/P cervical spinal fusion   . Seborrheic keratoses   . Squamous cell carcinoma in situ   . Stage 2 moderate COPD by GOLD classification (HCC)    PFT 04/16/2022 ratio 69% FEV1 52% FVC 75% DLCO 61%     . Stroke    (CMD)    not to his knowledge but he is weaker on right side  . Subcutaneous nodules   . Type 2 diabetes mellitus with both eyes affected by mild nonproliferative retinopathy without macular edema, with long-term current use of insulin  (HCC)   . Type 2 diabetes mellitus with diabetic polyneuropathy (HCC)   . Vitamin D deficiency   . Walker as ambulation aid   . Xerosis cutis     Past Surgical History:  Procedure Laterality Date  . ANKLE FRACTURE SURGERY Left 2014  . ANTERIOR CERVICAL DISCECTOMY W/ FUSION N/A 08/07/2015   ANTERIOR CERVICAL DISCECTOMY & FUSION MULTI LEVEL C5-6 and C6-7 DePuy;  Surgeon: Rockey Ozell Chaco, MD;  Location: [facility] MAIN OR;  Service: Neurosurgery;  Laterality: N/A;  . BLEPHAROPLASTY Bilateral    BLEPHAROPLASTY UPPER LIDS  . CARDIAC CATHETERIZATION     x3 w/ last ~ @2009 ; no interventions  . CATARACT  EXTRACTION W/  INTRAOCULAR LENS IMPLANT Left    Procedure: CATARACT EXTRACTION LEFT EYE /W IMPLANT;  Surgeon: Ozell Lemond Sar, MD;  Location: [facility] OUTPATIENT OR;  Service: Ophthalmology;  Laterality: Left;  30 TIP; PF LIDOCAINE , IDDM, TORIC, I-STENT  . CATARACT EXTRACTION W/  INTRAOCULAR LENS IMPLANT Right 05/17/2019   Procedure: CATARACT EXTRACTION  RIGHT EYE /W IMPLANT;  Surgeon: Ozell Lemond Sar, MD;  Location: [facility] OUTPATIENT OR;  Service: Ophthalmology;  Laterality: Right;  30TIP; PF LIDOCAINE ; IDDM, I-STENT; TORIC  . CATARACT EXTRACTION W/  INTRAOCULAR LENS IMPLANT Left 02/08/2019   Procedure: CATARACT EXTRACTION W/  INTRAOCULAR LENS IMPLANT; MWE 10.0 [id number]  SN [id number], (1) iStent S/N [id number]   . CATARACT EXTRACTION W/  INTRAOCULAR LENS IMPLANT Right 05/17/2019   Procedure: CATARACT EXTRACTION W/  INTRAOCULAR LENS IMPLANT; [id number] Toric 13.5D  Istent [id number]  MWE  . CHOLECYSTECTOMY     Dr Murvin and no gallbladder noted  . COLONOSCOPY    . CYST REMOVAL     From Chin  . CYSTOSCOPY W/ STONE MANIPULATION Left 10/16/2020   left retrogrades, left URS with laser, left JJ stent placement  . ESOPHAGOGASTRODUODENOSCOPY Left 04/06/2017  . INTRAVITREAL INJECTIONS OF PHARMACOLOGIC AGENT Bilateral    For NPDR  . LEVATOR ADVANCEMENT/RESECTION  10/22/2011   Procedure: LEVATOR RESECTION FOR PTOSIS;  Surgeon: Lamar Belvie Seashore, MD;  Location: [facility] OUTPATIENT OR;  Service: Ophthalmology;  Laterality: Bilateral;  . MEMBRANECTOMY Right 09/24/2021   MEMBRANE PEEL;  Surgeon: Jeppie Anner Hint II, MD  . PARS PLANA VITRECTOMY Right 09/24/2021   PARS PLANA VITRECTOMY 25GA;  Surgeon: Jeppie Anner Hint II, MD  . TOTAL KNEE ARTHROPLASTY Left 2014   x 2 (MRSA in original knee replacement)  . TOTAL KNEE ARTHROPLASTY Right 12/25/2022   TOTAL KNEE ARTHROPLASTY performed by Vinie Carlin Fonder, MD at [facility]  . TRANSURETHRAL RESECTION OF PROSTATE    . TYMPANOSTOMY TUBE  PLACEMENT     as an adult         Family History  Problem Relation Name Age of Onset  . Hypertension Mother    . Heart attack Mother    . Dementia Mother    . Heart disease Mother    . Cataracts Mother    . Deep vein thrombosis Mother    . Cancer Father         PROSTATE  . Diabetes Father    . Hypertension Father    . Heart failure Father    . Dementia Father    . Alzheimer's disease Father    . Prostate cancer Father    . Melanoma Father    . Cataracts Father    . Stroke Maternal Grandmother    . Cataracts Maternal Grandmother    . Cataracts Maternal Grandfather    . Cataracts Paternal Grandmother    . Diabetes Paternal Grandfather    . Cataracts Paternal Grandfather    . Dementia Paternal Uncle    . Diabetes Paternal Uncle    . Glaucoma Neg Hx    . Macular degeneration Neg Hx      Current Outpatient Medications  Medication Sig Dispense Refill  . amiodarone  (PACERONE ) 200 mg tablet Take 1 tablet (200 mg total) by mouth daily. Takes once daily. 90 tablet 0  . atorvastatin  (LIPITOR) 80 mg tablet TAKE 1 TABLET BY MOUTH EVERY DAY 90 tablet 1  . azithromycin  (ZITHROMAX ) 250 mg tablet Take 1 tablet (250 mg total) by mouth 3 (three) times a week. 12 tablet 11  . budesonide  (PULMICORT ) 0.5 mg/2 mL nebulizer solution INHALE ONE VIAL VIA NEBULIZER IN THE MORNING AND ONE AT BEDTIME 120 mL 0  . dapagliflozin propanediol (FARXIGA) 10 mg tab tablet Take 10 mg by mouth daily.    . donepeziL  (ARICEPT ) 10 mg tablet Take 1 tablet (10 mg total)  by mouth nightly. 90 tablet 1  . eszopiclone (LUNESTA) 3 mg tablet Take 1 tab at night as needed for sleep 30 tablet 2  . glucagon (Gvoke) 1 mg/0.2 mL soln Inject 1 mg under the skin as needed. 0.2 mL 1  . insulin  aspart U-100 (NovoLOG ) 100 unit/mL injection Inject under the skin 3 (three) times a day before meals. Sliding scale.    . insulin  degludec (TRESIBA  FlexTouch) 200 unit/mL (3 mL) pen Inject 40 Units under the skin daily.    . insulin   degludec (TRESIBA  FlexTouch) 200 unit/mL (3 mL) pen Inject 50 Units under the skin at bedtime.    . isosorbide  mononitrate (IMDUR ) 30 mg 24 hr tablet TAKE 1 TABLET BY MOUTH EVERY DAY 30 tablet 11  . Lancets misc USE LANCETS TO CHECK BLOOD SUGAR 3 TIMES DAILY. DX: E11.42 200 each 3  . latanoprost  (XALATAN ) 0.005 % ophthalmic solution Administer 1 drop into each eyes at bedtime. 2.5 mL 3  . memantine  (NAMENDA ) 10 mg tablet Take  1 twice a day 180 tablet 1  . metoprolol  succinate (TOPROL  XL) 25 mg 24 hr tablet Take 1 tablet (25 mg total) by mouth daily. 90 tablet 0  . mirabegron  (Myrbetriq ) 50 mg Tb24 Take 1 tablet (50 mg total) by mouth daily. 30 tablet 11  . montelukast  (SINGULAIR ) 10 mg tablet TAKE 1 TABLET BY MOUTH EVERY DAY 90 tablet 2  . multivitamin with folic acid 400 mcg tab Take 1 tablet by mouth daily.    . NON FORMULARY CPAP - Use as directed nightly.    SABRA omeprazole (PriLOSEC) 40 mg DR capsule TAKE 1 CAPSULE BY MOUTH NIGHTLY. 30 capsule 5  . OneTouch Verio Reflect Start kit Use to monitor BG as directed. Dx E11.65 1 each 0  . OneTouch Verio test strips test strip CHECK BLOOD GLUCOSE 3 TIMES/DAY AS DIRECTED. DX E11.65 100 strip 5  . oxyBUTYnin  (DITROPAN  XL) 10 mg 24 hr tablet TAKE 1 TABLET BY MOUTH EVERY DAY 30 tablet 11  . oxygen (O2) gas Inhale 2-5 L/min continuous. via nasal canula    . pen needle, diabetic 29 gauge x 1/2 ndle USE AS DIRECTED 4 TIMES DAILY 100 each 3  . polysaccharide iron complex (Poly-Iron) 150 mg iron capsule Take 1 capsule (150 mg total) by mouth 2 (two) times a day. 180 capsule 1  . rivaroxaban  (Xarelto ) 15 mg tablet Take 1 tablet (15 mg total) by mouth daily with dinner. 90 tablet 3  . sertraline  (ZOLOFT ) 100 mg tablet Take 1 tablet by mouth daily. 90 tablet 1  . spironolactone  (ALDACTONE ) 25 mg tablet TAKE 1 TABLET (25 MG TOTAL) BY MOUTH DAILY. 90 tablet 1  . torsemide  (DEMADEX ) 20 mg tablet TAKE TWO TABLETS BY MOUTH EVERY MORNING AND TAKE ONE TABLET BY MOUTH  EVERY AFTERNOON 90 tablet 0  . Ventolin  HFA 90 mcg/actuation inhaler Inhale 2 puffs every 4 (four) hours as needed (wheezing). 18 g 11  . vit C/E/Zn/coppr/lutein/zeaxan (PRESERVISION AREDS-2 ORAL) Take 1 tablet by mouth daily.    . Wixela Inhub 250-50 mcg/dose diskus inhaler INHALE 1 PUFF BY MOUTH EVERY 12 HOURS 60 each 5   No current facility-administered medications for this visit.    Allergies  Allergen Reactions  . Ace Inhibitors Cough  . Buprenorphine Hcl GI Intolerance  . Codeine GI Intolerance  . Hydromorphone  Other (See Comments) and Mental Status Changes    hallucinations  . Gabapentin   . Mirtazapine Other (See Comments)  Nightmares & altered mental status  . Morphine  GI Intolerance and Mental Status Changes    Patient states that he gets hallucinations  . Opioids - Morphine  Analogues GI Intolerance and Mental Status Changes  . Opioids-Meperidine And Related   . Prednisone  Other (See Comments)    Increases blood sugar     REVIEW OF SYSTEMS:  All other systems were reviewed and were negative in detail except as noted in the HPI.  PHYSICAL EXAMINATION: There were no vitals filed for this visit. Weights (last 90 days)     None       General:  Resting comfortably in NAD Skin:  Warm & dry Neuro:  Alert HEENT:  Sclera anicteric.   Resp:  Resp even and nonlabored.  Lungs sounds clear throughout CV:  S1S2 regular rate and rhythm,  3/6 systolic ejection murmur , No gallops or rubs. No significant carotid delay. Abd:  Soft, nontender, NABS Ext:  No cyanosis or edema MSK:  Moving all extremities.   Neck:  No thyromegaly.  No JVD.  no carotid bruit(s) Pulses:  2+ and symmetrical upper and lower extremities.   Heme: No signs of bleeding or excessive bruising.  Labs/Imaging: No results found for: CKTOTAL, CKMB, CKMBINDEX   .    Lab Results  Component Value Date   MG 2.5 07/23/2023   AST 18 09/18/2023   ALT 28 09/18/2023   BILITOT 0.3 09/18/2023   INR  1.1 06/04/2023   TSH 1.514 06/19/2023   CHOL 105 06/19/2023   HDL 40 (L) 06/19/2023

## 2023-12-10 NOTE — Progress Notes (Signed)
 Cardiology Clinic Note  [facility] HEART & VASCULAR - [address] [place] [address] Dept: [phone]  REQUESTING PHYSICIAN: No ref. provider found  REASON FOR CONSULTATION:  Chief Complaint  Patient presents with  . Coronary Artery Disease    Patient did not sleep good last night. Chest hurts. Heart fluttering and pounding this am.    Portion of this note were dictated using DRAGON voices recognition software. Please disregard any errors in transcription .  ASSESSMENT AND PLAN: CAD s/p PCI with DES x 2 to mLAD on 11/06/2023 -c/w plavix  75mg  daily (also on Xarelto  for afib). Plavix  would need to continued for at least 1 year, after which plavix  can be stopped and continue with Xarelto . -c/w lipitor 80mg  nightly, Toprol  25mg  daily   Moderate Aortic stenosis, with chronic chest pain -moderate stenosis by dobutamine echo   Paroxysmal Afib, currently in sinus rhythm -on Xarelto  15mg  daily, toprol  25mg  daily, stop amiodarone . Check TSH, CMP, CBC today.   HTN HFpEF -feels weak and dizzy all the time, will allow higher BP goal of less than 140/4mmHg. -on Imdur  30mg  daily, Toprol  25mg  daily, Spironolactone  25mg  daily, Torsemide  20mg  2 tabes in the morning and 1 tab in the afternoon.   Dizziness/weakness -could be multifactorial, with anemia, COPD, carotid stenosis etc -ambulates with walker. O2 sat in clinic with ambulation >96%.   HLD 70% left ICA stenosis, s/p left ICA stent 06/04/2023 -c/w lipitor 80mg  nightly, on Xarelto    -RTC in 4 months or sooner if needed -The above plan was discussed with the patient in detail. He expresses understanding and agrees with the plan.  Thank you for the opportunity of assisting in the care of Tramell Piechota Couse.  Please do not hesitate to call if you have any questions.  Sincerely,   Lennart RAMAN. Debora, MD Cardiology  [facility] 12/10/2023  10:01 AM   HISTORY AND PRESENT ILLNESS:     Mike Hunter is a very pleasant 73 year old male with PMHx of COPD on night time O2 2-5 liters, OSA on CPAP, GERD, HTN, IDDM2, Diabetic polyneuropathy, CAD s/p angioplasty and 2 DES to mLAD on 11/06/2023, paroxysmal Afib, dementia, Anemia of chronic disease, stage 3 CKD, obesity, moderate aortic stenosis, carotid stenosis s/p left carotid stenting. He is here for his cardiac issues. He used to follow up with Dr Maralee.   12/10/2023: He is here for follow up after left heart cath on 11/06/2023 with angioplasty and stenting of mLAD. He had pork chops and pinto beans last night and had pretty bad indigestion which kept him up all night.   10/23/2023: He is here for follow up. He had positive dobutamine stress test (initially ordered for aortic stenosis evaluation), with wall motion abnormalities and chest pain. He saw Dr Toribio subsequently due to this reason and was recommended for medical mangement at this time. He still has chest pains, dyspnea with exertion, dizziness. Heart rate also varies at times from 48bpm to 90sbpm. BP dropped down to 80s one time in rehab with dizziness.   04/20/2023: He is transferring his care with me today. He ambulates with rollater and feels weak and dizzy all the time, esp with ambulation. He also has chronic chest pain which worsens with ambulation.   CORONARY RISK FACTORS:  advanced age (older than 53 for men, 46 for women), diabetes mellitus, dyslipidemia, hypertension, male gender, obesity (BMI >= 30 kg/m2), sedentary lifestyle, and smoking/ tobacco exposure  Prior Cardiac Workup: EKG today 12/10/2023: NSR, nonspecific intraventricular conduction delay US  carotid 11/20/2023: This is an abnormal carotid duplex exam demonstrating the disease described below. A stent is noted in the left carotid  CCA-\BIF-ICA. It is patent and without important restenosis. Increased velocity suggests 50% or greater stenosis in the left ECA. CCA volume flow is in a normal range  bilaterally, but higher on the left. No prior exam from this laboratory was available for direct comparison.  [employer] 11/13/2023: FINAL PROVIDER INTERPRETATION  Agree with Findings.   PRELIMINARY FINDINGS  Patient had a min HR of 53 bpm, max HR of 154 bpm, and avg HR of 68 bpm.  Predominant underlying rhythm was Sinus Rhythm. 3 Supraventricular  Tachycardia runs occurred, the run with the fastest interval lasting 11.2  secs with a max rate of 154 bpm  avg 135 bpm ; the run with the fastest  interval was also the longest. Isolated SVEs were rare  <1.0% , and no SVE  Couplets or SVE Triplets were present. Isolated VEs were rare  <1.0% , VE  Couplets were rare  <1.0% , and no VE Triplets were present.   LHC 11/06/2023: DIAGNOSTIC FINDINGS:    1.  Borderline stenosis of the mid LAD.  RFR abnormal, 0.73. 2.  Otherwise nonobstructive CAD. 3.  Aortic stenosis with mean gradient 23 mmHg by pullback.   PCI:    Successful angioplasty and stenting of the LAD with 2 overlapping stents.  The area of borderline stenosis was focal, but there is atherosclerosis throughout the LAD which required long segment stenting.  A 2.75 x 48 mm had a 3.5 x 38 mm Xience drug-eluting stent were used.  This was aggressively postdilated with noncompliant balloons and IVUS was used for stent optimization.  Dobutamine stress echo 05/27/2023: The patient had 12-21-08 chest pain, central tightness and heaviness.  Baseline basal inferior and  inferolateral hypokinesis became akinetic [possibly hibernating  myocardium], baseline normal  anterior wall became hypokinetic [?], likely severe stenosis of dominant  LCfx, vs RCA and Cfx  stenosis. Test stopped due to severe CP and evidence of ischemia. The  patient achieved 51 % of  maximum predicted heart rate.  AVA 1.2 cm at rest and after dobutamine with adequate increase in SV-  Moderate AS.    EKG 04/20/2023: Nsr, normal EKG   TTE 03/03/2023: Left ventricular systolic  function is normal.  LV ejection fraction = 55-60%.  There are regional wall motion abnormalities as specified below.  There is basal LV inferolateral wall hypokinesis  There is mid LV inferolateral wall hypokinesis  There is moderate to severe aortic stenosis.  Aortic valve mean pressure gradient is 19 mmHg.  Aortic valve peak pressure gradient is 32 mmHg.  The calculated aortic valve area using the continuity equation is 0.81  cm2.  AS dimensionless index  VTI  is 0.27.  There is mild mitral regurgitation.  There is trace tricuspid regurgitation.  Trace pulmonic valvular regurgitation.  IVC size was normal.  There is no pericardial effusion.  Compared to prior study, aortic valve stenosis has worsened. Wall motion  abnormality is new.    Lexiscan Stress test 03/03/2023: Severe chest pain during Lexiscan injection. 1. No reversible ischemia or infarction.  2. Normal left ventricular wall motion.  3. Left ventricular ejection fraction 51%  4. Non invasive risk stratification*: Low   MRA neck 03/03/2023: 1. 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 Otherwise, normal  brain MRI with no acute intracranial pathology. 2. Patent intracranial vasculature without significant stenosis or occlusion. 3. 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 4. Otherwise, patent vasculature of the neck with no evidence of hemodynamically significant stenosis or occlusion   LHC 01/19/2018: LMCA  Normal appearance with 0% stenosis.  LAD    Lesion on Prox LAD  10% stenosis 5 mm length .    Lesion on Mid LAD  20% stenosis 10 mm length .  LCx  Normal appearance with 0% stenosis.  RCA  Normal appearance with 0% stenosis.   Medical History[1]  Surgical History[2]       Family History[3]  Current Medications[4]  Allergies[5]   REVIEW OF  SYSTEMS:  All other systems were reviewed and were negative in detail except as noted in the HPI.  PHYSICAL EXAMINATION: Vitals:   12/10/23 0954  BP: 126/73  Pulse: 70  SpO2: 98%   Weights (last 90 days)     None       General:  Resting comfortably in NAD Skin:  Warm & dry Neuro:  Alert HEENT:  Sclera anicteric.   Resp:  Resp even and nonlabored.  Lungs sounds clear throughout CV:  S1S2 regular rate and rhythm, no murmur noted, No gallops or rubs Abd:  Soft, nontender, NABS Ext:  No cyanosis or edema MSK:  Moving all extremities.   Neck:  No thyromegaly.  No JVD.  no carotid bruit(s) Pulses:  2+ and symmetrical upper and lower extremities.   Heme: No signs of bleeding or excessive bruising.  Labs/Imaging: No results found for: CKTOTAL, CKMB, CKMBINDEX   .    Lab Results  Component Value Date   MG 2.5 07/23/2023   AST 14 11/06/2023   ALT 18 11/06/2023   BILITOT 0.4 11/06/2023   INR 1.1 06/04/2023   TSH 2.181 11/06/2023   CHOL 119 11/06/2023   HDL 44 (L) 11/06/2023        [1] Past Medical History: Diagnosis Date  . 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   . Adjustment disorder with mixed anxiety and depressed mood   . Anemia due to stage 3b chronic kidney disease (HCC)   . Aortic atherosclerosis (HCC)   . Arteriosclerosis of coronary artery    Cardiac Cath 11/17/07 : LM normal; LAD mid 20%; LCX normal ; RCA normal - Charlie non obstructive CAD  . Asthma-COPD overlap syndrome    (CMD)    O2 1 liter while sleeping  . Astigmatism of left eye   . Benign hypertension with chronic kidney disease, stage III (HCC)   . Benign prostatic hyperplasia with urinary obstruction    S/p Laser Fulguration  . Bipolar disorder    (CMD)   . Cervical radiculopathy   . Cervical stenosis of spinal canal   . Cherry angioma   . Chronic diastolic CHF (congestive heart failure)    (CMD)   . Chronic heart failure with preserved  ejection fraction (HFpEF)    (CMD)   . Chronic midline low back pain without sciatica   . Chronic nonseasonal allergic rhinitis due to pollen   . CKD (chronic kidney disease)   . Cognitive disorder   . Coronary artery disease involving native coronary artery of native heart without angina pectoris   . Decreased functional mobility and endurance   . Delayed emergence from anesthesia   . Dependence on supplemental oxygen  Home O2 1L/min [place] at night  . Depression   . Diabetes mellitus with stage 3 chronic kidney disease, with long-term current use of insulin  (HCC)   . Diabetic autonomic neuropathy (HCC)   . Diabetic polyneuropathy    (CMD)   . Diabetic retinopathy    (CMD)    NPDR OU  . Epiretinal membrane (ERM) of right eye   . Episodic mood disorder   . Essential hypertension   . GAD (generalized anxiety disorder)   . Gastroesophageal reflux disease without esophagitis   . Glaucoma, unspecified glaucoma type, unspecified laterality    Med Hx: Glaucoma;   . Hearing loss    no hearing aids  . History of fall    several 2023  . Hypercholesterolemia   . Hyperlipidemia   . Hypertension associated with diabetes (HCC)   . Hypomagnesemia   . Impingement syndrome of right shoulder   . Iron deficiency anemia 03/24/2017   With Hemoccult positive stool.  Referred to GI oct 2018  . Irregular astigmatism of right eye   . Keratoconus of both eyes   . Left nephrolithiasis   . Mixed stress and urge urinary incontinence   . Moderate nonproliferative diabetic retinopathy of both eyes with macular edema associated with type 2 diabetes mellitus (HCC)   . Moderate persistent asthma without complication (CMD)   . Multiple benign nevi   . Nocturnal hypoxia    ONO 08/09/21 desat below 88% 31.6 minutes     . OAB (overactive bladder)   . Obesity due to excess calories with serious comorbidity   . Obstructive sleep apnea syndrome    CPAP-16 CWP; [employer]  . Onychogryphosis   . OSA (obstructive sleep  apnea)    using CPAP ~ 10-15 hours daily  . Paroxysmal atrial fibrillation (HCC)   . Peptic ulcer   . Peripheral neuropathy   . PONV (postoperative nausea and vomiting)   . Primary open angle glaucoma of both eyes, moderate stage   . Primary osteoarthritis involving multiple joints   . Pruritic dermatitis   . Pulmonary nodules/lesions, multiple    Benign by Dr. Dante (Pulmonary) 2013     . Pyogenic arthritis of knee    (CMD)   . Restrictive lung disease   . S/P cervical spinal fusion   . Seborrheic keratoses   . Squamous cell carcinoma in situ   . Stage 2 moderate COPD by GOLD classification (HCC)    PFT 04/16/2022 ratio 69% FEV1 52% FVC 75% DLCO 61%     . Stroke    (CMD)    not to his knowledge but he is weaker on right side  . Subcutaneous nodules   . Type 2 diabetes mellitus with both eyes affected by mild nonproliferative retinopathy without macular edema, with long-term current use of insulin  (HCC)   . Type 2 diabetes mellitus with diabetic polyneuropathy (HCC)   . Vitamin D deficiency   . Walker as ambulation aid   . Xerosis cutis   [2] Past Surgical History: Procedure Laterality Date  . ANKLE FRACTURE SURGERY Left 2014  . ANTERIOR CERVICAL DISCECTOMY W/ FUSION N/A 08/07/2015   ANTERIOR CERVICAL DISCECTOMY & FUSION MULTI LEVEL C5-6 and C6-7 [place];  Surgeon: Rockey Ozell Chaco, MD;  Location: [facility] MAIN OR;  Service: Neurosurgery;  Laterality: N/A;  . BLEPHAROPLASTY Bilateral    BLEPHAROPLASTY UPPER LIDS  . CARDIAC CATHETERIZATION     x3 w/ last ~ @2009 ; no interventions  . CARDIAC CATHETERIZATION Left 11/06/2023  CV CATHETERIZATION LEFT HEART performed by Alverna Lamar Sieving, DO at [facility] INVASIVE LAB  . CARDIAC CATHETERIZATION N/A 11/06/2023   Percutaneous coronary intervention performed by Alverna Lamar Sieving, DO at [facility] INVASIVE LAB  . CARDIAC CATHETERIZATION N/A 11/06/2023   Intravascular ultrasound coronary performed by Alverna Lamar Sieving, DO at [facility] INVASIVE LAB  .  CATARACT EXTRACTION W/  INTRAOCULAR LENS IMPLANT Left    Procedure: CATARACT EXTRACTION LEFT EYE /W IMPLANT;  Surgeon: Ozell Lemond Sar, MD;  Location: [facility] OUTPATIENT OR;  Service: Ophthalmology;  Laterality: Left;  30 TIP; PF LIDOCAINE , IDDM, TORIC, I-STENT  . CATARACT EXTRACTION W/  INTRAOCULAR LENS IMPLANT Right 05/17/2019   Procedure: CATARACT EXTRACTION RIGHT EYE /W IMPLANT;  Surgeon: Ozell Lemond Sar, MD;  Location: [facility] OUTPATIENT OR;  Service: Ophthalmology;  Laterality: Right;  30TIP; PF LIDOCAINE ; IDDM, I-STENT; TORIC  . CATARACT EXTRACTION W/  INTRAOCULAR LENS IMPLANT Left 02/08/2019   Procedure: CATARACT EXTRACTION W/  INTRAOCULAR LENS IMPLANT; MWE 10.0 [id number]  SN [id number], (1) iStent S/N [id number]   . CATARACT EXTRACTION W/  INTRAOCULAR LENS IMPLANT Right 05/17/2019   Procedure: CATARACT EXTRACTION W/  INTRAOCULAR LENS IMPLANT; [id number] Toric 13.5D  Istent [id number]  MWE  . CHOLECYSTECTOMY     Dr Murvin and no gallbladder noted  . COLONOSCOPY    . CYST REMOVAL     From Chin  . CYSTOSCOPY W/ STONE MANIPULATION Left 10/16/2020   left retrogrades, left URS with laser, left JJ stent placement  . ESOPHAGOGASTRODUODENOSCOPY Left 04/06/2017  . INTRAVITREAL INJECTIONS OF PHARMACOLOGIC AGENT Bilateral    For NPDR  . LEVATOR ADVANCEMENT/RESECTION  10/22/2011   Procedure: LEVATOR RESECTION FOR PTOSIS;  Surgeon: Lamar Belvie Seashore, MD;  Location: [facility] OUTPATIENT OR;  Service: Ophthalmology;  Laterality: Bilateral;  . MEMBRANECTOMY Right 09/24/2021   MEMBRANE PEEL;  Surgeon: Jeppie Anner Hint II, MD  . PARS PLANA VITRECTOMY Right 09/24/2021   PARS PLANA VITRECTOMY 25GA;  Surgeon: Jeppie Anner Hint II, MD  . TOTAL KNEE ARTHROPLASTY Left 2014   x 2 (MRSA in original knee replacement)  . TOTAL KNEE ARTHROPLASTY Right 12/25/2022   TOTAL KNEE ARTHROPLASTY performed by Vinie Carlin Fonder, MD at [facility] OR  . TRANSURETHRAL RESECTION OF PROSTATE    .  TYMPANOSTOMY TUBE PLACEMENT     as an adult  [3] Family History Problem Relation Name Age of Onset  . Hypertension Mother    . Heart attack Mother    . Dementia Mother    . Heart disease Mother    . Cataracts Mother    . Deep vein thrombosis Mother    . Cancer Father         PROSTATE  . Diabetes Father    . Hypertension Father    . Heart failure Father    . Dementia Father    . Alzheimer's disease Father    . Prostate cancer Father    . Melanoma Father    . Cataracts Father    . Stroke Maternal Grandmother    . Cataracts Maternal Grandmother    . Cataracts Maternal Grandfather    . Cataracts Paternal Grandmother    . Diabetes Paternal Grandfather    . Cataracts Paternal Grandfather    . Dementia Paternal Uncle    . Diabetes Paternal Uncle    . Glaucoma Neg Hx    . Macular degeneration Neg Hx    [4] Current Outpatient Medications  Medication Sig Dispense Refill  . amiodarone  (PACERONE ) 200 mg  tablet Take one tablet (200 mg total) by mouth daily. Takes once daily. 90 tablet 0  . atorvastatin  (LIPITOR) 80 mg tablet Take one tablet (80 mg total) by mouth daily. 90 tablet 1  . azithromycin  (ZITHROMAX ) 250 mg tablet Take 1 tablet (250 mg total) by mouth 3 (three) times a week. 12 tablet 11  . budesonide  (PULMICORT ) 0.5 mg/2 mL nebulizer solution Take 2 mL (0.5 mg total) by nebulization in the morning and 2 mL (0.5 mg total) before bedtime. 120 mL 3  . calcium  carbonate (CALCIUM  600 ORAL) Take 1 tablet by mouth 2 (two) times a day.    . clopidogreL  (PLAVIX ) 75 mg tablet Take 1 tablet (75 mg total) by mouth daily. 90 tablet 3  . dapagliflozin propanediol (FARXIGA) 10 mg tab tablet Take 10 mg by mouth daily.    . donepeziL  (ARICEPT ) 10 mg tablet Take 1 tablet (10 mg total) by mouth nightly. 90 tablet 1  . glucagon (Gvoke) 1 mg/0.2 mL soln Inject 1 mg under the skin as needed. 0.2 mL 1  . glucose blood (OneTouch Verio test strips) test strip Use as instructed to check glucose 3 times  daily 100 strip 5  . insulin  aspart U-100 (NovoLOG ) 100 unit/mL injection Inject under the skin 3 (three) times a day before meals. Sliding scale.    . insulin  degludec (TRESIBA  FlexTouch) 200 unit/mL (3 mL) pen Inject 40 Units under the skin daily.    . insulin  degludec (TRESIBA  FlexTouch) 200 unit/mL (3 mL) pen Inject 50 Units under the skin at bedtime.    . isosorbide  mononitrate (IMDUR ) 30 mg 24 hr tablet Take one tablet (30 mg total) by mouth daily. 30 tablet 11  . Lancets misc USE LANCETS TO CHECK BLOOD SUGAR 3 TIMES DAILY. DX: E11.42 200 each 3  . latanoprost  (XALATAN ) 0.005 % ophthalmic solution Administer 1 drop into each eyes at bedtime. 2.5 mL 3  . memantine  (NAMENDA ) 10 mg tablet Take  1 twice a day 180 tablet 1  . metoprolol  succinate (TOPROL  XL) 25 mg 24 hr tablet Take one tablet (25 mg total) by mouth daily. 90 tablet 0  . montelukast  (SINGULAIR ) 10 mg tablet TAKE 1 TABLET BY MOUTH EVERY DAY 90 tablet 2  . multivitamin with folic acid 400 mcg tab Take 1 tablet by mouth daily.    . NON FORMULARY CPAP - Use as directed nightly.    SABRA omeprazole (PriLOSEC) 40 mg DR capsule TAKE 1 CAPSULE BY MOUTH NIGHTLY. 30 capsule 5  . OneTouch Verio Reflect Start kit Use to monitor BG as directed. Dx E11.65 1 each 0  . oxyBUTYnin  (DITROPAN  XL) 10 mg 24 hr tablet TAKE 1 TABLET BY MOUTH EVERY DAY 30 tablet 11  . oxygen (O2) gas Inhale 2-5 L/min continuous. via nasal canula    . pen needle, diabetic 29 gauge x 1/2 ndle USE AS DIRECTED 4 TIMES DAILY 100 each 3  . pen needle, diabetic 31 gauge x 3/16 ndle 1 Application by miscellaneous route daily. 100 each 3  . polysaccharide iron complex (Poly-Iron) 150 mg iron capsule Take 1 capsule (150 mg total) by mouth 2 (two) times a day. 180 capsule 1  . rivaroxaban  (Xarelto ) 15 mg tablet Take one tablet (15 mg total) by mouth daily with dinner. 90 tablet 3  . sertraline  (ZOLOFT ) 100 mg tablet Take 1 tablet by mouth daily. 90 tablet 1  . spironolactone   (ALDACTONE ) 25 mg tablet Take one tablet (25 mg total) by mouth  daily. 90 tablet 1  . suvorexant (Belsomra) 15 mg tab tablet Take 1 tab at night 30 tablet 2  . teriparatide  (FORTEO ) 20 mcg/dose (635mcg/2.48mL) pnij injection Inject 0.08 mL (20 mcg total) under the skin daily. 2.48 mL 11  . torsemide  (DEMADEX ) 20 mg tablet TAKE TWO TABLETS BY MOUTH EVERY MORNING AND TAKE ONE TABLET BY MOUTH EVERY AFTERNOON 90 tablet 0  . Ventolin  HFA 90 mcg/actuation inhaler Inhale 2 puffs every 4 (four) hours as needed (wheezing). 18 g 11  . vit C/E/Zn/coppr/lutein/zeaxan (PRESERVISION AREDS-2 ORAL) Take 1 tablet by mouth daily.    . Wixela Inhub 250-50 mcg/dose diskus inhaler INHALE 1 PUFF BY MOUTH EVERY 12 HOURS 60 each 5   No current facility-administered medications for this visit.  [5] Allergies Allergen Reactions  . Ace Inhibitors Cough  . Buprenorphine Hcl GI Intolerance  . Codeine GI Intolerance  . Hydromorphone  Other (See Comments) and Mental Status Changes    hallucinations  . Gabapentin   . Mirtazapine Other (See Comments)    Nightmares & altered mental status  . Morphine  GI Intolerance and Mental Status Changes    Patient states that he gets hallucinations  . Opioids - Morphine  Analogues GI Intolerance and Mental Status Changes  . Opioids-Meperidine And Related   . Prednisone  Other (See Comments)    Increases blood sugar

## 2023-12-16 NOTE — Progress Notes (Signed)
 I reviewed the cardiac rehab ITP as written and agree. Available notes and rhythms reviewed.

## 2023-12-28 NOTE — Progress Notes (Signed)
 Cardiac rehab notes and rhythms reviewed.

## 2023-12-29 ENCOUNTER — Encounter (HOSPITAL_BASED_OUTPATIENT_CLINIC_OR_DEPARTMENT_OTHER): Payer: Self-pay

## 2023-12-29 ENCOUNTER — Emergency Department (HOSPITAL_BASED_OUTPATIENT_CLINIC_OR_DEPARTMENT_OTHER)

## 2023-12-29 ENCOUNTER — Inpatient Hospital Stay (HOSPITAL_BASED_OUTPATIENT_CLINIC_OR_DEPARTMENT_OTHER)
Admission: EM | Admit: 2023-12-29 | Discharge: 2024-01-05 | DRG: 194 | Disposition: A | Discharge status: Home Health | Attending: Internal Medicine | Admitting: Internal Medicine

## 2023-12-29 ENCOUNTER — Other Ambulatory Visit: Payer: Self-pay

## 2023-12-29 DIAGNOSIS — Z87891 Personal history of nicotine dependence: Secondary | ICD-10-CM

## 2023-12-29 DIAGNOSIS — Z6837 Body mass index (BMI) 37.0-37.9, adult: Secondary | ICD-10-CM | POA: Diagnosis not present

## 2023-12-29 DIAGNOSIS — E876 Hypokalemia: Secondary | ICD-10-CM | POA: Diagnosis present

## 2023-12-29 DIAGNOSIS — I5031 Acute diastolic (congestive) heart failure: Secondary | ICD-10-CM | POA: Diagnosis not present

## 2023-12-29 DIAGNOSIS — Z955 Presence of coronary angioplasty implant and graft: Secondary | ICD-10-CM

## 2023-12-29 DIAGNOSIS — R1310 Dysphagia, unspecified: Secondary | ICD-10-CM | POA: Diagnosis present

## 2023-12-29 DIAGNOSIS — Z1152 Encounter for screening for COVID-19: Secondary | ICD-10-CM | POA: Diagnosis not present

## 2023-12-29 DIAGNOSIS — I130 Hypertensive heart and chronic kidney disease with heart failure and stage 1 through stage 4 chronic kidney disease, or unspecified chronic kidney disease: Secondary | ICD-10-CM | POA: Diagnosis present

## 2023-12-29 DIAGNOSIS — G4733 Obstructive sleep apnea (adult) (pediatric): Secondary | ICD-10-CM | POA: Diagnosis present

## 2023-12-29 DIAGNOSIS — T502X5A Adverse effect of carbonic-anhydrase inhibitors, benzothiadiazides and other diuretics, initial encounter: Secondary | ICD-10-CM | POA: Diagnosis not present

## 2023-12-29 DIAGNOSIS — Z7951 Long term (current) use of inhaled steroids: Secondary | ICD-10-CM

## 2023-12-29 DIAGNOSIS — M810 Age-related osteoporosis without current pathological fracture: Secondary | ICD-10-CM | POA: Diagnosis present

## 2023-12-29 DIAGNOSIS — E66812 Obesity, class 2: Secondary | ICD-10-CM | POA: Diagnosis present

## 2023-12-29 DIAGNOSIS — Z7901 Long term (current) use of anticoagulants: Secondary | ICD-10-CM | POA: Diagnosis not present

## 2023-12-29 DIAGNOSIS — N1832 Chronic kidney disease, stage 3b: Secondary | ICD-10-CM | POA: Diagnosis present

## 2023-12-29 DIAGNOSIS — K5900 Constipation, unspecified: Secondary | ICD-10-CM | POA: Diagnosis present

## 2023-12-29 DIAGNOSIS — J440 Chronic obstructive pulmonary disease with acute lower respiratory infection: Secondary | ICD-10-CM | POA: Diagnosis present

## 2023-12-29 DIAGNOSIS — E7800 Pure hypercholesterolemia, unspecified: Secondary | ICD-10-CM | POA: Diagnosis present

## 2023-12-29 DIAGNOSIS — E669 Obesity, unspecified: Secondary | ICD-10-CM | POA: Diagnosis not present

## 2023-12-29 DIAGNOSIS — I5032 Chronic diastolic (congestive) heart failure: Secondary | ICD-10-CM | POA: Diagnosis present

## 2023-12-29 DIAGNOSIS — I2489 Other forms of acute ischemic heart disease: Secondary | ICD-10-CM | POA: Diagnosis present

## 2023-12-29 DIAGNOSIS — I2510 Atherosclerotic heart disease of native coronary artery without angina pectoris: Secondary | ICD-10-CM | POA: Diagnosis present

## 2023-12-29 DIAGNOSIS — E1165 Type 2 diabetes mellitus with hyperglycemia: Secondary | ICD-10-CM | POA: Diagnosis present

## 2023-12-29 DIAGNOSIS — Z794 Long term (current) use of insulin: Secondary | ICD-10-CM

## 2023-12-29 DIAGNOSIS — R000 Tachycardia, unspecified: Secondary | ICD-10-CM

## 2023-12-29 DIAGNOSIS — Z79899 Other long term (current) drug therapy: Secondary | ICD-10-CM

## 2023-12-29 DIAGNOSIS — I350 Nonrheumatic aortic (valve) stenosis: Secondary | ICD-10-CM | POA: Diagnosis present

## 2023-12-29 DIAGNOSIS — R7989 Other specified abnormal findings of blood chemistry: Secondary | ICD-10-CM | POA: Diagnosis present

## 2023-12-29 DIAGNOSIS — J159 Unspecified bacterial pneumonia: Secondary | ICD-10-CM | POA: Diagnosis present

## 2023-12-29 DIAGNOSIS — J189 Pneumonia, unspecified organism: Secondary | ICD-10-CM | POA: Diagnosis present

## 2023-12-29 DIAGNOSIS — I4891 Unspecified atrial fibrillation: Secondary | ICD-10-CM | POA: Diagnosis not present

## 2023-12-29 DIAGNOSIS — E1122 Type 2 diabetes mellitus with diabetic chronic kidney disease: Secondary | ICD-10-CM | POA: Diagnosis present

## 2023-12-29 DIAGNOSIS — Z7902 Long term (current) use of antithrombotics/antiplatelets: Secondary | ICD-10-CM

## 2023-12-29 DIAGNOSIS — I480 Paroxysmal atrial fibrillation: Secondary | ICD-10-CM | POA: Diagnosis present

## 2023-12-29 LAB — TROPONIN T, HIGH SENSITIVITY
Troponin T High Sensitivity: 27 ng/L — ABNORMAL HIGH (ref <19)
Troponin T High Sensitivity: 24 ng/L — ABNORMAL HIGH (ref <19)

## 2023-12-29 LAB — RESP PANEL BY RT-PCR (RSV, FLU A&B, COVID)  RVPGX2
Influenza A by PCR: NEGATIVE
Influenza B by PCR: NEGATIVE
Resp Syncytial Virus by PCR: NEGATIVE
SARS Coronavirus 2 by RT PCR: NEGATIVE

## 2023-12-29 LAB — PRO BRAIN NATRIURETIC PEPTIDE: Pro Brain Natriuretic Peptide: 3395 pg/mL — ABNORMAL HIGH (ref <300.0)

## 2023-12-29 LAB — MAGNESIUM: Magnesium: 2.5 mg/dL — ABNORMAL HIGH (ref 1.7–2.4)

## 2023-12-29 LAB — GLUCOSE, CAPILLARY: Glucose-Capillary: 90 mg/dL (ref 70–99)

## 2023-12-29 LAB — COMPREHENSIVE METABOLIC PANEL WITH GFR
ALT: 32 U/L (ref 0–44)
AST: 26 U/L (ref 15–41)
Albumin: 4 g/dL (ref 3.5–5.0)
Alkaline Phosphatase: 111 U/L (ref 38–126)
Anion gap: 13 (ref 5–15)
BUN: 28 mg/dL — ABNORMAL HIGH (ref 8–23)
CO2: 24 mmol/L (ref 22–32)
Calcium: 8.7 mg/dL — ABNORMAL LOW (ref 8.9–10.3)
Chloride: 102 mmol/L (ref 98–111)
Creatinine, Ser: 1.82 mg/dL — ABNORMAL HIGH (ref 0.61–1.24)
GFR, Estimated: 38 mL/min — ABNORMAL LOW (ref >60)
Glucose, Bld: 189 mg/dL — ABNORMAL HIGH (ref 70–99)
Potassium: 3.6 mmol/L (ref 3.5–5.1)
Sodium: 139 mmol/L (ref 135–145)
Total Bilirubin: 0.4 mg/dL (ref 0.0–1.2)
Total Protein: 7.6 g/dL (ref 6.5–8.1)

## 2023-12-29 LAB — CBC
HCT: 38.3 % — ABNORMAL LOW (ref 39.0–52.0)
Hemoglobin: 11.3 g/dL — ABNORMAL LOW (ref 13.0–17.0)
MCH: 22.2 pg — ABNORMAL LOW (ref 26.0–34.0)
MCHC: 29.5 g/dL — ABNORMAL LOW (ref 30.0–36.0)
MCV: 75.4 fL — ABNORMAL LOW (ref 80.0–100.0)
Platelets: 434 K/uL — ABNORMAL HIGH (ref 150–400)
RBC: 5.08 MIL/uL (ref 4.22–5.81)
RDW: 18 % — ABNORMAL HIGH (ref 11.5–15.5)
WBC: 11.8 K/uL — ABNORMAL HIGH (ref 4.0–10.5)
nRBC: 0 % (ref 0.0–0.2)

## 2023-12-29 LAB — LACTIC ACID, PLASMA: Lactic Acid, Venous: 0.9 mmol/L (ref 0.5–1.9)

## 2023-12-29 MED ORDER — IPRATROPIUM-ALBUTEROL 0.5-2.5 (3) MG/3ML IN SOLN
3.0000 mL | RESPIRATORY_TRACT | Status: DC | PRN
  Administered 2024-01-01: 3 mL via RESPIRATORY_TRACT
  Filled 2023-12-29: qty 3

## 2023-12-29 MED ORDER — OXYCODONE HCL 5 MG PO TABS
5.0000 mg | ORAL_TABLET | Freq: Four times a day (QID) | ORAL | Status: AC | PRN
  Administered 2023-12-30 – 2024-01-01 (×4): 5 mg via ORAL
  Filled 2023-12-29 (×4): qty 1

## 2023-12-29 MED ORDER — ADULT MULTIVITAMIN W/MINERALS CH
1.0000 | ORAL_TABLET | Freq: Every day | ORAL | Status: DC
  Administered 2023-12-30 – 2024-01-05 (×7): 1 via ORAL
  Filled 2023-12-29 (×7): qty 1

## 2023-12-29 MED ORDER — OXYCODONE-ACETAMINOPHEN 5-325 MG PO TABS
1.0000 | ORAL_TABLET | Freq: Once | ORAL | Status: AC
  Administered 2023-12-29: 1 via ORAL
  Filled 2023-12-29: qty 1

## 2023-12-29 MED ORDER — PROCHLORPERAZINE EDISYLATE 10 MG/2ML IJ SOLN: 5.0000 mg | Freq: Four times a day (QID) | INTRAMUSCULAR | Status: DC | PRN

## 2023-12-29 MED ORDER — TERIPARATIDE 620 MCG/2.48ML ~~LOC~~ SOPN: 2.4800 mL | PEN_INJECTOR | SUBCUTANEOUS | Status: DC

## 2023-12-29 MED ORDER — PANTOPRAZOLE SODIUM 40 MG PO TBEC
40.0000 mg | DELAYED_RELEASE_TABLET | Freq: Every day | ORAL | Status: DC
  Administered 2023-12-30 – 2024-01-05 (×7): 40 mg via ORAL
  Filled 2023-12-29 (×7): qty 1

## 2023-12-29 MED ORDER — ACETAMINOPHEN 325 MG PO TABS
650.0000 mg | ORAL_TABLET | Freq: Four times a day (QID) | ORAL | Status: DC | PRN
  Administered 2023-12-29 – 2024-01-04 (×5): 650 mg via ORAL
  Filled 2023-12-29 (×5): qty 2

## 2023-12-29 MED ORDER — SODIUM CHLORIDE 0.9 % IV SOLN
1.0000 g | Freq: Once | INTRAVENOUS | Status: AC
  Administered 2023-12-29: 1 g via INTRAVENOUS
  Filled 2023-12-29: qty 10

## 2023-12-29 MED ORDER — LATANOPROST 0.005 % OP SOLN
1.0000 [drp] | Freq: Every day | OPHTHALMIC | Status: DC
  Administered 2023-12-30 – 2024-01-04 (×7): 1 [drp] via OPHTHALMIC
  Filled 2023-12-29: qty 2.5

## 2023-12-29 MED ORDER — MONTELUKAST SODIUM 10 MG PO TABS
10.0000 mg | ORAL_TABLET | Freq: Every day | ORAL | Status: DC
  Administered 2023-12-29 – 2024-01-04 (×7): 10 mg via ORAL
  Filled 2023-12-29 (×7): qty 1

## 2023-12-29 MED ORDER — OXYBUTYNIN CHLORIDE ER 10 MG PO TB24
10.0000 mg | ORAL_TABLET | Freq: Every day | ORAL | Status: DC
  Administered 2023-12-30 – 2024-01-05 (×7): 10 mg via ORAL
  Filled 2023-12-29 (×7): qty 1

## 2023-12-29 MED ORDER — BUDESONIDE 0.5 MG/2ML IN SUSP
2.0000 mL | Freq: Two times a day (BID) | RESPIRATORY_TRACT | Status: DC
  Administered 2023-12-30 – 2024-01-05 (×13): 0.5 mg via RESPIRATORY_TRACT
  Filled 2023-12-29 (×13): qty 2

## 2023-12-29 MED ORDER — CLOPIDOGREL BISULFATE 75 MG PO TABS
75.0000 mg | ORAL_TABLET | Freq: Every day | ORAL | Status: DC
  Administered 2023-12-30 – 2024-01-05 (×7): 75 mg via ORAL
  Filled 2023-12-29 (×7): qty 1

## 2023-12-29 MED ORDER — FLUTICASONE FUROATE-VILANTEROL 200-25 MCG/ACT IN AEPB
1.0000 | INHALATION_SPRAY | Freq: Every day | RESPIRATORY_TRACT | Status: DC
  Administered 2023-12-30 – 2024-01-05 (×7): 1 via RESPIRATORY_TRACT
  Filled 2023-12-29: qty 28

## 2023-12-29 MED ORDER — SODIUM CHLORIDE 0.9 % IV SOLN
500.0000 mg | Freq: Once | INTRAVENOUS | Status: AC
  Administered 2023-12-29: 500 mg via INTRAVENOUS
  Filled 2023-12-29: qty 5

## 2023-12-29 MED ORDER — SODIUM CHLORIDE 0.9 % IV SOLN
100.0000 mg | Freq: Two times a day (BID) | INTRAVENOUS | Status: DC
  Filled 2023-12-29: qty 100

## 2023-12-29 MED ORDER — RIVAROXABAN 20 MG PO TABS
20.0000 mg | ORAL_TABLET | Freq: Every day | ORAL | Status: DC
  Administered 2023-12-29 – 2024-01-04 (×7): 20 mg via ORAL
  Filled 2023-12-29 (×7): qty 1

## 2023-12-29 MED ORDER — POLYETHYLENE GLYCOL 3350 17 G PO PACK
17.0000 g | PACK | Freq: Every day | ORAL | Status: DC | PRN
  Administered 2024-01-02 – 2024-01-03 (×2): 17 g via ORAL
  Filled 2023-12-29 (×2): qty 1

## 2023-12-29 MED ORDER — MELATONIN 5 MG PO TABS
5.0000 mg | ORAL_TABLET | Freq: Every evening | ORAL | Status: DC | PRN
  Administered 2023-12-29 – 2023-12-31 (×3): 5 mg via ORAL
  Filled 2023-12-29 (×3): qty 1

## 2023-12-29 MED ORDER — INSULIN ASPART 100 UNIT/ML IJ SOLN
0.0000 [IU] | Freq: Every day | INTRAMUSCULAR | Status: DC
  Administered 2024-01-02 – 2024-01-04 (×2): 2 [IU] via SUBCUTANEOUS

## 2023-12-29 MED ORDER — PRESERVISION AREDS PO CAPS: ORAL_CAPSULE | Freq: Every day | ORAL | Status: DC

## 2023-12-29 MED ORDER — SERTRALINE HCL 100 MG PO TABS
100.0000 mg | ORAL_TABLET | Freq: Every day | ORAL | Status: DC
  Administered 2023-12-30 – 2024-01-05 (×7): 100 mg via ORAL
  Filled 2023-12-29 (×7): qty 1

## 2023-12-29 MED ORDER — HYDROMORPHONE HCL 1 MG/ML IJ SOLN
0.5000 mg | INTRAMUSCULAR | Status: DC | PRN
  Administered 2023-12-30: 0.5 mg via INTRAVENOUS
  Filled 2023-12-29: qty 1

## 2023-12-29 MED ORDER — INSULIN ASPART 100 UNIT/ML IJ SOLN
0.0000 [IU] | Freq: Three times a day (TID) | INTRAMUSCULAR | Status: DC
  Administered 2023-12-30: 3 [IU] via SUBCUTANEOUS
  Administered 2023-12-30 (×2): 2 [IU] via SUBCUTANEOUS
  Administered 2023-12-31 – 2024-01-02 (×7): 3 [IU] via SUBCUTANEOUS
  Administered 2024-01-02: 5 [IU] via SUBCUTANEOUS
  Administered 2024-01-02: 2 [IU] via SUBCUTANEOUS
  Administered 2024-01-03: 8 [IU] via SUBCUTANEOUS
  Administered 2024-01-03: 5 [IU] via SUBCUTANEOUS
  Administered 2024-01-03: 3 [IU] via SUBCUTANEOUS
  Administered 2024-01-04 (×3): 5 [IU] via SUBCUTANEOUS
  Administered 2024-01-05: 3 [IU] via SUBCUTANEOUS

## 2023-12-29 MED ORDER — SODIUM CHLORIDE 0.9 % IV BOLUS
500.0000 mL | Freq: Once | INTRAVENOUS | Status: AC
  Administered 2023-12-29: 500 mL via INTRAVENOUS

## 2023-12-29 MED ORDER — SODIUM CHLORIDE 0.9 % IV SOLN
1.0000 g | INTRAVENOUS | Status: DC
  Administered 2023-12-30 – 2024-01-04 (×6): 1 g via INTRAVENOUS
  Filled 2023-12-29 (×6): qty 10

## 2023-12-29 MED ORDER — SPIRONOLACTONE 25 MG PO TABS
25.0000 mg | ORAL_TABLET | Freq: Every day | ORAL | Status: DC
  Administered 2023-12-30 – 2024-01-05 (×7): 25 mg via ORAL
  Filled 2023-12-29 (×7): qty 1

## 2023-12-29 MED ORDER — SODIUM CHLORIDE 0.9 % IV SOLN: 500.0000 mg | INTRAVENOUS | Status: DC

## 2023-12-29 NOTE — ED Notes (Signed)
 [facility] departing now

## 2023-12-29 NOTE — ED Notes (Signed)
 [facility] is at bedside now for transfer.  Pt is alert comfortable and vitals stable.

## 2023-12-29 NOTE — ED Triage Notes (Signed)
 Pt reports chest pain & palpitations X 3 days. Also endorses SOB, chills & diaphoresis   Had stent placed couple of months ago

## 2023-12-29 NOTE — H&P (Incomplete)
 History and Physical  Mike Hunter FMW:[id number] DOB: Oct 11, 1950 DOA: 12/29/2023  Referring physician: Accepted by Dr. Jillian, [facility], Hospitalist service PCP: [name], [name], MD  Outpatient Specialists: Cardiology Patient coming from: Home  Chief Complaint: Palpitations, cough, and chest pain   HPI: Mike Hunter is a 73 y.o. male with medical history significant for paroxysmal A-fib on Xarelto , chronic HFpEF, COPD, CKD 3B, type 2 diabetes, hypertension, osteoporosis on teriparatide , coronary artery disease status post PCI with stent placement, obesity, OSA on CPAP, who initially presented at [facility] ED due to left-sided chest pain, worse with coughing, and associated with palpitations.  Denies any subjective fevers or chills.  In the ER, tachycardic and tachypneic., chest x-ray revealing left basilar patchy opacities which may represent atelectasis, aspiration or pneumonia.  Started on empiric IV antibiotics for CAP in the ER.  Lab studies notable for proBNP greater than 2300.  High sensitive troponin 27, 24.  [facility], hospitalist service, asked admit.  Accepted by Dr. Jillian and transferred to [facility] telemetry cardiac unit as inpatient status.  Her WBC the patient is alert and oriented x 3.  He is wearing his CPAP.  Still having pleuritic left chest pain that has been constant.  Analgesics in place.  ED Course: Temperature 97.6.  BP 115/70, pulse 95, respiration rate 20, O2 saturation 99% on room air.  Review of Systems: Review of systems as noted in the HPI. All other systems reviewed and are negative.   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 He has never used smokeless tobacco. He reports that he does not drink alcohol and does not use drugs.   Allergies  Allergen Reactions   Buprenorphine Hcl Nausea And Vomiting    Other reaction(s): Nausea And Vomiting    Hydromorphone  Other (See Comments)    Other reaction(s): Mental Status Changes (intolerance) Other reaction(s): HALLUCINATIONS hallucinations hallucinations hallucinations hallucinations    Codeine Nausea And Vomiting   Mirtazapine     Other reaction(s): Other (See Comments) Nightmares & altered mental status    Morphine  And Codeine Nausea And Vomiting    Family history: None reported.  Prior to Admission medications   Medication Sig Start Date End Date Taking? Authorizing Provider  albuterol  (PROVENTIL  HFA;VENTOLIN  HFA) 108 (90 BASE) MCG/ACT inhaler Inhale 2 puffs into the lungs every 4 (four) hours as needed for wheezing.   Yes [provider]  budesonide  (PULMICORT ) 0.5 MG/2ML nebulizer solution Take 2 mLs by nebulization 2 (two) times daily.   Yes [provider]  budesonide -formoterol  (SYMBICORT) 160-4.5 MCG/ACT inhaler Inhale 2 puffs  into the lungs 2 (two) times daily. 07/03/16  Yes [provider]  clopidogrel  (PLAVIX ) 75 MG tablet Take 1 tablet by mouth daily. 05/18/23 05/17/24 Yes [provider]  dextromethorphan -guaiFENesin  (MUCINEX  DM) 30-600 MG 12hr tablet Take 1 tablet by mouth 2 (two) times daily. Patient taking differently: Take 1 tablet by mouth daily as needed for cough. 06/10/23  Yes Cheryle Page,  MD  donepezil  (ARICEPT ) 10 MG tablet Take 10 mg by mouth at bedtime.   Yes [provider]  insulin  aspart (NOVOLOG  FLEXPEN) 100 UNIT/ML injection Inject 26-44 Units into the skin See admin instructions. 5 units prior to meals for cbg>= 150  Sliding scale (base is 26u in the AM, 30u at noon, and  36-44 at night   Yes [provider]  Insulin  Degludec (TRESIBA  FLEXTOUCH [place]) Inject 40-50 Units into the skin See admin instructions. 40 morning, 50 night   Yes [provider]  isosorbide  mononitrate (IMDUR ) 30 MG 24 hr tablet Take 1 tablet by mouth daily. 01/22/22  Yes [provider]  latanoprost  (XALATAN ) 0.005 % ophthalmic solution Place 1 drop into both eyes at bedtime.     Yes [provider]  memantine  (NAMENDA ) 10 MG tablet Take 10 mg by mouth 2 (two) times daily.   Yes [provider]  montelukast  (SINGULAIR ) 10 MG tablet Take 10 mg by mouth at bedtime.   Yes [provider]  Multiple Vitamin (MULTIVITAMIN WITH MINERALS) TABS tablet Take 1 tablet by mouth daily.   Yes [provider]  Multiple Vitamins-Minerals (PRESERVISION AREDS PO) Take 1 tablet by mouth daily.   Yes [provider]  omeprazole (PRILOSEC) 40 MG capsule Take 1 capsule by mouth at bedtime. 02/02/17  Yes [provider]  oxybutynin  (DITROPAN -XL) 10 MG 24 hr tablet Take 10 mg by mouth daily.   Yes [provider]  sertraline  (ZOLOFT ) 100 MG tablet Take 100 mg by mouth daily.   Yes [provider]  spironolactone  (ALDACTONE ) 25 MG tablet Take 25 mg by mouth daily.   Yes [provider]  Suvorexant (BELSOMRA) 15 MG TABS Take 1 tablet by mouth at bedtime.   Yes [provider]  Teriparatide  620 MCG/2.48ML SOPN Inject 2.48 mLs into the skin See admin instructions. Give 2.48 ml subcutaneous 6 times a day per patient 11/26/23  Yes [provider]  torsemide  (DEMADEX ) 20 MG tablet Take 20-40 mg by mouth See  admin instructions. Take two tablets by mouth in the morning and then take one tablet in the evening per patient and wife   Yes [provider]  NAPOLEON INHUB 250-50 MCG/ACT AEPB Inhale 1 puff into the lungs in the morning and at bedtime. 04/20/23  Yes [provider]  XARELTO  20 MG TABS tablet Take 20 mg by mouth daily with supper. 04/04/22  Yes [provider]  amiodarone  (PACERONE ) 200 MG tablet Take 2 tablets (400 mg total) by mouth 2 (two) times daily for 7 days, THEN 1 tablet (200 mg total) 2 (two) times daily for 23 days. 08/26/23 09/25/23  Pahwani, Ravi, MD  eszopiclone (LUNESTA) 2 MG TABS tablet Take 2 mg by mouth at bedtime as needed for sleep. Patient not taking: Reported on 12/29/2023 04/23/23   [provider]  memantine  (NAMENDA ) 5 MG tablet Take 5 mg by mouth 2 (two) times daily. Patient not taking: Reported on 12/29/2023    [provider]  metoprolol  tartrate (LOPRESSOR ) 25 MG tablet Take 1 tablet (25 mg  total) by mouth 2 (two) times daily. 08/26/23 09/25/23  Vernon Ranks, MD  mirabegron  ER (MYRBETRIQ ) 50 MG TB24 tablet Take 1 tablet by mouth daily. 12/05/22 11/30/23  [provider]  sertraline  (ZOLOFT ) 50 MG tablet Take 50 mg by mouth every morning.  Patient not taking: Reported on 12/29/2023    [provider]    Physical Exam: BP 107/82 (BP Location: Right Arm)   Pulse 78   Temp 97.6 F (36.4 C) (Oral)   Resp 18   Ht 5' 11 (1.803 m)   Wt 122.6 kg   SpO2 97%   BMI 37.69 kg/m   General: 73 y.o. year-old male well developed well nourished in no acute distress.  Alert and oriented x3. Cardiovascular: Regular rate and rhythm with no rubs or gallops.  No thyromegaly or JVD noted.  No lower extremity edema. 2/4 pulses in all 4 extremities. Respiratory: Faint rales at the left base.  Good inspiratory effort. Abdomen: Soft nontender nondistended with normal bowel sounds x4 quadrants. Muskuloskeletal: No cyanosis, clubbing  or edema noted bilaterally Neuro: CN II-XII intact, strength, sensation, reflexes Skin: No ulcerative lesions noted or rashes Psychiatry: Judgement and insight appear normal. Mood is appropriate for condition and setting          Labs on Admission:  Basic Metabolic Panel: Recent Labs  Lab 12/29/23 1232  NA 139  K 3.6  CL 102  CO2 24  GLUCOSE 189*  BUN 28*  CREATININE 1.82*  CALCIUM  8.7*  MG 2.5*   Liver Function Tests: Recent Labs  Lab 12/29/23 1232  AST 26  ALT 32  ALKPHOS 111  BILITOT 0.4  PROT 7.6  ALBUMIN 4.0   No results for input(s): LIPASE, AMYLASE in the last 168 hours. No results for input(s): AMMONIA in the last 168 hours. CBC: Recent Labs  Lab 12/29/23 1232  WBC 11.8*  HGB 11.3*  HCT 38.3*  MCV 75.4*  PLT 434*   Cardiac Enzymes: No results for input(s): CKTOTAL, CKMB, CKMBINDEX, TROPONINI in the last 168 hours.  BNP (last 3 results) Recent Labs    06/06/23 1218 08/24/23 1431  BNP 485.2* 177.1*    ProBNP (last 3 results) Recent Labs    12/29/23 1232  PROBNP 3,395.0*    CBG: Recent Labs  Lab 12/29/23 2100  GLUCAP 90    Radiological Exams on Admission: DG Chest Portable 1 View Result Date: 12/29/2023 CLINICAL DATA:  Three day history of chest pain and palpitations EXAM: PORTABLE CHEST 1 VIEW COMPARISON:  Chest radiograph dated 11/06/2023 FINDINGS: Normal lung volumes. Left basilar patchy opacities. No pleural effusion or pneumothorax. Similar enlarged cardiomediastinal silhouette. Cervical spinal fixation hardware appears intact. IMPRESSION: 1. Left basilar patchy opacities, which may represent atelectasis, aspiration, or pneumonia. 2. Similar cardiomegaly. Electronically Signed   By: Limin  Xu M.D.   On: 12/29/2023 13:33    EKG: I independently viewed the EKG done and my findings are as followed: Sinus tachycardia rate of 134.  Nonspecific T changes.  QTc 507.  Assessment/Plan Present on Admission:  CAP (community  acquired pneumonia)  Principal Problem:   CAP (community acquired pneumonia)  Possible left lower lobe CAP, seen on chest x-ray, POA Obtain baseline procalcitonin Monitor fever curve and WBCs Continue empiric IV antibiotics Rocephin  and doxycycline  until pulmonary infection is ruled out As needed bronchodilators As needed antitussives Maintain O2 saturation above 90%  Sinus tachycardia History of paroxysmal A-fib on Xarelto  Continue home Xarelto  P.o. Lopressor  12.5 mg twice daily Monitor on  telemetry Recommend cardiology consultation in the morning.  Acute on chronic HFpEF Mild increase in pulmonary vascularity suggestive of pulmonary edema on chest x-ray Elevated proBNP greater than 3300 Elevated troponin 27, 24 Follow 2D echo Start strict I's and O's and daily weight  Nonproductive cough, possibly from mild pulmonary edema versus possible pneumonia Afebrile no leukocytosis Influenza A&B by PCR negative RSV by PCR negative COVID-19 by PCR negative.  Type 2 diabetes with hyperglycemia Hemoglobin A1c Start insulin  sliding scale.  COPD Resume home bronchodilators Maintain O2 saturation above 90%  OSA on CPAP Resume home CPAP nightly 2 L O2 saturation at nighttime  Obesity BMI 37 Recommend weight loss outpatient with regular physical activity and healthy dieting.   Critical care time: 65 minutes.   DVT prophylaxis: Home Xarelto   Code Status: Full code  Family Communication: None at bedside.  Disposition Plan: Admitted to telemetry cardiac unit  Consults called: None.  Admission status: Inpatient status.   Status is: Inpatient The patient requires at least 2 midnights for further evaluation and treatment of present condition.   Terry LOISE Hurst MD [facility] Pager [phone]  If 7PM-7AM, please contact night-coverage [email or web address] Password [facility]  12/29/2023, 9:36 PM

## 2023-12-29 NOTE — Plan of Care (Signed)
 Patient is a 73 year old male with history of coronary artery disease status post PCI, paroxysmal A-fib, hypertension, HFpEF, hyperlipidemia, carotid artery disease, CKD stage IIIb, COPD who presents to med Center at [facility] with complaint of 3 days history of chest pressure, shortness of breath, palpitations.  Also reported chills at home.  Due to his shortness of breath, he was not able to participate with cardiac rehab. On presentation, he was in sinus tachycardia.  Lab work showed creatinine of 1.8, elevated proBNP in the range of 3000's, mild leukocytosis.  He remained in sinus tachycardia with heart rate in the range of 120.  He has been saturating fine on room air. Chest x-ray showed left basilar opacity consistent for community-acquired pneumonia.  He had a history of sepsis with pneumonia in the past. Patient has been requested to be admitted for further management of community-acquired pneumonia. He will be admitted to [facility].

## 2023-12-29 NOTE — ED Provider Notes (Signed)
 [place] [facility] AT [facility] Provider Note   CSN: [id number] Arrival date & time: 12/29/23  1220     Patient presents with: Chest Pain and Shortness of Breath   Mike Hunter is a 73 y.o. male.  With extensive cardiac history including atrial fibrillation on Xarelto , heart failure, CAD status post DES, CKD and COPD who presents to the ED for chest pain shortness of breath.  3 days of ongoing left-sided chest pain he describes as pressure and shortness of breath.  He could not participate in cardiac rehab due to tachycardia.  Has had similar episodes of tachycardia in the past.  He does note some associated chills and diaphoresis during the last couple days as well.  Recently treated for community-acquired pneumonia.  He is followed by Dr. Debora with cardiology    Chest Pain Associated symptoms: shortness of breath   Shortness of Breath Associated symptoms: chest pain        Prior to Admission medications   Medication Sig Start Date End Date Taking? Authorizing Provider  albuterol  (PROVENTIL  HFA;VENTOLIN  HFA) 108 (90 BASE) MCG/ACT inhaler Inhale 2 puffs into the lungs every 4 (four) hours as needed for wheezing.    [provider]  amiodarone  (PACERONE ) 200 MG tablet Take 2 tablets (400 mg total) by mouth 2 (two) times daily for 7 days, THEN 1 tablet (200 mg total) 2 (two) times daily for 23 days. 08/26/23 09/25/23  Vernon Ranks, MD  budesonide  (PULMICORT ) 0.5 MG/2ML nebulizer solution Take 2 mLs by nebulization 2 (two) times daily.    [provider]  budesonide -formoterol  (SYMBICORT) 160-4.5 MCG/ACT inhaler Inhale 2 puffs into the lungs 2 (two) times daily. 07/03/16   [provider]  clopidogrel  (PLAVIX ) 75 MG tablet Take 1 tablet by mouth daily. 05/18/23 05/17/24  [provider]  dextromethorphan -guaiFENesin  (MUCINEX  DM) 30-600 MG 12hr tablet Take 1 tablet by mouth 2 (two) times daily. Patient taking differently: Take 1  tablet by mouth daily as needed for cough. 06/10/23   Cheryle Page, MD  donepezil  (ARICEPT ) 10 MG tablet Take 10 mg by mouth at bedtime.    [provider]  eszopiclone (LUNESTA) 2 MG TABS tablet Take 2 mg by mouth at bedtime as needed for sleep. 04/23/23   [provider]  insulin  aspart (NOVOLOG  FLEXPEN) 100 UNIT/ML injection Inject 26-44 Units into the skin See admin instructions. 5 units prior to meals for cbg>= 150  Sliding scale (base is 26u in the AM, 30u at noon, and  36-44 at night    [provider]  Insulin  Degludec (TRESIBA  FLEXTOUCH [place]) Inject 40-50 Units into the skin See admin instructions. 40 morning, 50 night    [provider]  isosorbide  mononitrate (IMDUR ) 30 MG 24 hr tablet Take 1 tablet by mouth daily. 01/22/22   [provider]  latanoprost  (XALATAN ) 0.005 % ophthalmic solution Place 1 drop into both eyes at bedtime.      [provider]  memantine  (NAMENDA ) 5 MG tablet Take 5 mg by mouth 2 (two) times daily.    [provider]  metoprolol  tartrate (LOPRESSOR ) 25 MG tablet Take 1 tablet (25 mg total) by mouth 2 (two) times daily. 08/26/23 09/25/23  Vernon Ranks, MD  mirabegron  ER (MYRBETRIQ ) 50 MG TB24 tablet Take 1 tablet by mouth daily. 12/05/22 11/30/23  [provider]  montelukast  (SINGULAIR ) 10 MG tablet Take 10 mg by mouth at bedtime.    [provider]  Multiple Vitamin (  MULTIVITAMIN WITH MINERALS) TABS tablet Take 1 tablet by mouth daily.    [provider]  Multiple Vitamins-Minerals (PRESERVISION AREDS PO) Take 1 tablet by mouth daily.    [provider]  omeprazole (PRILOSEC) 40 MG capsule Take 1 capsule by mouth at bedtime. 02/02/17   [provider]  oxybutynin  (DITROPAN -XL) 10 MG 24 hr tablet Take 10 mg by mouth daily.    [provider]  sertraline  (ZOLOFT ) 50 MG tablet Take 50 mg by mouth every morning.     [provider]  spironolactone   (ALDACTONE ) 25 MG tablet Take 25 mg by mouth daily.    [provider]  torsemide  (DEMADEX ) 20 MG tablet Take 20-40 mg by mouth See admin instructions. Take two tablets by mouth in the morning and then take one tablet in the evening per patient and wife    [provider]  NAPOLEON INHUB 250-50 MCG/ACT AEPB Inhale 1 puff into the lungs in the morning and at bedtime. 04/20/23   [provider]  XARELTO  20 MG TABS tablet Take 20 mg by mouth daily with supper. 04/04/22   [provider]    Allergies: Buprenorphine hcl, Hydromorphone , Codeine, Mirtazapine, and Morphine  and codeine    Review of Systems  Respiratory:  Positive for shortness of breath.   Cardiovascular:  Positive for chest pain.    Updated Vital Signs BP 100/73   Pulse (!) 126   Temp 97.9 F (36.6 C)   Resp 14   SpO2 100%   Physical Exam Vitals and nursing note reviewed.  HENT:     Head: Normocephalic and atraumatic.  Eyes:     Pupils: Pupils are equal, round, and reactive to light.  Cardiovascular:     Rate and Rhythm: Regular rhythm. Tachycardia present.  Pulmonary:     Effort: Pulmonary effort is normal.     Breath sounds: Normal breath sounds.  Abdominal:     Palpations: Abdomen is soft.     Tenderness: There is no abdominal tenderness.  Musculoskeletal:     Right lower leg: No edema.     Left lower leg: No edema.  Skin:    General: Skin is warm and dry.  Neurological:     Mental Status: He is alert.  Psychiatric:        Mood and Affect: Mood normal.     (all labs ordered are listed, but only abnormal results are displayed) Labs Reviewed  CBC - Abnormal; Notable for the following components:      Result Value   WBC 11.8 (*)    Hemoglobin 11.3 (*)    HCT 38.3 (*)    MCV 75.4 (*)    MCH 22.2 (*)    MCHC 29.5 (*)    RDW 18.0 (*)    Platelets 434 (*)    All other components within normal limits  COMPREHENSIVE METABOLIC PANEL WITH GFR - Abnormal; Notable for the  following components:   Glucose, Bld 189 (*)    BUN 28 (*)    Creatinine, Ser 1.82 (*)    Calcium  8.7 (*)    GFR, Estimated 38 (*)    All other components within normal limits  MAGNESIUM  - Abnormal; Notable for the following components:   Magnesium  2.5 (*)    All other components within normal limits  PRO BRAIN NATRIURETIC PEPTIDE - Abnormal; Notable for the following components:   Pro Brain Natriuretic Peptide 3,395.0 (*)    All other components within normal limits  TROPONIN T, HIGH SENSITIVITY - Abnormal; Notable for the following components:   Troponin T High Sensitivity 27 (*)    All other components within normal limits  RESP PANEL BY RT-PCR (RSV, FLU A&B, COVID)  RVPGX2  CULTURE, BLOOD (ROUTINE X 2)  CULTURE, BLOOD (ROUTINE X 2)  LACTIC ACID, PLASMA  LACTIC ACID, PLASMA  TROPONIN T, HIGH SENSITIVITY    EKG: EKG Interpretation Date/Time:  Tuesday December 29 2023 12:32:10 EDT Ventricular Rate:  134 PR Interval:  113 QRS Duration:  88 QT Interval:  339 QTC Calculation: 507 R Axis:   109  Text Interpretation: Sinus tachycardia Right axis deviation Minimal ST depression, diffuse leads Prolonged QT interval Confirmed by Pamella Sharper [id number]) on 12/29/2023 12:57:50 PM  Radiology: [facility] Chest Portable 1 View Result Date: 12/29/2023 CLINICAL DATA:  Three day history of chest pain and palpitations EXAM: PORTABLE CHEST 1 VIEW COMPARISON:  Chest radiograph dated 11/06/2023 FINDINGS: Normal lung volumes. Left basilar patchy opacities. No pleural effusion or pneumothorax. Similar enlarged cardiomediastinal silhouette. Cervical spinal fixation hardware appears intact. IMPRESSION: 1. Left basilar patchy opacities, which may represent atelectasis, aspiration, or pneumonia. 2. Similar cardiomegaly. Electronically Signed   By: Limin  Xu M.D.   On: 12/29/2023 13:33     Procedures   Medications Ordered in the ED  cefTRIAXone  (ROCEPHIN ) 1 g in sodium chloride  0.9 % 100 mL IVPB (1 g  Intravenous New Bag/Given 12/29/23 1403)  azithromycin  (ZITHROMAX ) 500 mg in sodium chloride  0.9 % 250 mL IVPB (has no administration in time range)  oxyCODONE -acetaminophen  (PERCOCET/ROXICET) 5-325 MG per tablet 1 tablet (has no administration in time range)  sodium chloride  0.9 % bolus 500 mL (0 mLs Intravenous Stopped 12/29/23 1403)    Clinical Course as of 12/29/23 1414  Tue Dec 29, 2023  1413 Labs shows slight leukocytosis of 11.8.  Slight bump in creatinine at 1.82 up from 1.5.  Initial troponin 27 will obtain delta.  No active chest pain now.  Patient has remained hemodynamically stable tachycardic sinus tachycardia in 120s.  No significant change after IV fluids.  Chest x-ray shows left-sided pneumonia.  Will treat for community-acquired pneumonia.  Given significant risk factors and history of pneumonia with sepsis will admit to medicine for inpatient management of community-acquired pneumonia.  Discussed admitting hospitalist accepts patient for admission. [MP]    Clinical Course User Index [MP] Pamella Sharper LABOR, DO                                 Medical Decision Making 73 year old male with history as above presenting to the ED given concern for chest pain shortness of breath.  Extensive cardiac history as above.  Recent DES.  3 days of chest pain shortness of breath.  Vital signs here notable for tachycardia in the 120s with tachypnea.  No adventitious lung sounds.  No peripheral edema.  Differential diagnosis includes ACS, heart failure exacerbation, dysrhythmia, community-acquired pneumonia, electrolyte imbalance.  Lower suspicion for PE based on compliance with Xarelto  prescription.  Will obtain cardiac workup including EKG has not any troponin BNP along with metabolic panel CBC chest x-ray and continue to monitor on telemetry.  Will provide small bolus of 500 cc IV fluids to see if rate improves  Amount and/or Complexity of Data Reviewed Labs: ordered. Radiology:  ordered.  Risk Prescription drug management. Decision regarding hospitalization.        Final diagnoses:  Community acquired pneumonia of  left lower lobe of lung  Sinus tachycardia    ED Discharge Orders     None          Pamella Ozell LABOR, DO 12/29/23 1414

## 2023-12-29 NOTE — Progress Notes (Signed)
   12/29/23 2119  BiPAP/CPAP/SIPAP  $ Face Mask Large  Yes  BiPAP/CPAP/SIPAP Resmed  Patient Home Machine No  Patient Home Mask No  Patient Home Tubing No  Auto Titrate No  BiPAP/CPAP /SiPAP Vitals  Resp 18  Bilateral Breath Sounds Clear;Diminished  MEWS Score/Color  MEWS Score 0  MEWS Score Color Green   Placed pt. On cpap of 10cmh20 with 2L of oxygen.

## 2023-12-30 ENCOUNTER — Inpatient Hospital Stay (HOSPITAL_COMMUNITY)

## 2023-12-30 DIAGNOSIS — I5032 Chronic diastolic (congestive) heart failure: Secondary | ICD-10-CM | POA: Diagnosis not present

## 2023-12-30 DIAGNOSIS — I480 Paroxysmal atrial fibrillation: Secondary | ICD-10-CM | POA: Diagnosis not present

## 2023-12-30 DIAGNOSIS — N1832 Chronic kidney disease, stage 3b: Secondary | ICD-10-CM | POA: Diagnosis not present

## 2023-12-30 DIAGNOSIS — I4891 Unspecified atrial fibrillation: Secondary | ICD-10-CM

## 2023-12-30 DIAGNOSIS — I5031 Acute diastolic (congestive) heart failure: Secondary | ICD-10-CM | POA: Diagnosis not present

## 2023-12-30 DIAGNOSIS — I350 Nonrheumatic aortic (valve) stenosis: Secondary | ICD-10-CM | POA: Diagnosis not present

## 2023-12-30 DIAGNOSIS — E669 Obesity, unspecified: Secondary | ICD-10-CM

## 2023-12-30 DIAGNOSIS — J189 Pneumonia, unspecified organism: Secondary | ICD-10-CM | POA: Diagnosis not present

## 2023-12-30 DIAGNOSIS — I2510 Atherosclerotic heart disease of native coronary artery without angina pectoris: Secondary | ICD-10-CM | POA: Diagnosis not present

## 2023-12-30 LAB — CBC
HCT: 33.5 % — ABNORMAL LOW (ref 39.0–52.0)
Hemoglobin: 9.8 g/dL — ABNORMAL LOW (ref 13.0–17.0)
MCH: 21.9 pg — ABNORMAL LOW (ref 26.0–34.0)
MCHC: 29.3 g/dL — ABNORMAL LOW (ref 30.0–36.0)
MCV: 74.8 fL — ABNORMAL LOW (ref 80.0–100.0)
Platelets: 325 K/uL (ref 150–400)
RBC: 4.48 MIL/uL (ref 4.22–5.81)
RDW: 17.7 % — ABNORMAL HIGH (ref 11.5–15.5)
WBC: 9.4 K/uL (ref 4.0–10.5)
nRBC: 0 % (ref 0.0–0.2)

## 2023-12-30 LAB — BASIC METABOLIC PANEL WITH GFR
Anion gap: 11 (ref 5–15)
BUN: 28 mg/dL — ABNORMAL HIGH (ref 8–23)
CO2: 25 mmol/L (ref 22–32)
Calcium: 8.4 mg/dL — ABNORMAL LOW (ref 8.9–10.3)
Chloride: 105 mmol/L (ref 98–111)
Creatinine, Ser: 1.85 mg/dL — ABNORMAL HIGH (ref 0.61–1.24)
GFR, Estimated: 38 mL/min — ABNORMAL LOW (ref >60)
Glucose, Bld: 170 mg/dL — ABNORMAL HIGH (ref 70–99)
Potassium: 3.2 mmol/L — ABNORMAL LOW (ref 3.5–5.1)
Sodium: 141 mmol/L (ref 135–145)

## 2023-12-30 LAB — ECHOCARDIOGRAM COMPLETE
AR max vel: 1.84 cm2
AV Area VTI: 1.79 cm2
AV Area mean vel: 1.82 cm2
AV Mean grad: 7.7 mmHg
AV Peak grad: 11.6 mmHg
Ao pk vel: 1.71 m/s
Area-P 1/2: 3.36 cm2
Height: 71 in
S' Lateral: 4.5 cm
Weight: 4299.85 [oz_av]

## 2023-12-30 LAB — GLUCOSE, CAPILLARY
Glucose-Capillary: 134 mg/dL — ABNORMAL HIGH (ref 70–99)
Glucose-Capillary: 136 mg/dL — ABNORMAL HIGH (ref 70–99)
Glucose-Capillary: 177 mg/dL — ABNORMAL HIGH (ref 70–99)
Glucose-Capillary: 176 mg/dL — ABNORMAL HIGH (ref 70–99)

## 2023-12-30 LAB — MAGNESIUM: Magnesium: 2.4 mg/dL (ref 1.7–2.4)

## 2023-12-30 LAB — PROCALCITONIN: Procalcitonin: 0.16 ng/mL

## 2023-12-30 LAB — PHOSPHORUS: Phosphorus: 4.5 mg/dL (ref 2.5–4.6)

## 2023-12-30 MED ORDER — POTASSIUM CHLORIDE CRYS ER 20 MEQ PO TBCR
40.0000 meq | EXTENDED_RELEASE_TABLET | ORAL | Status: AC
  Administered 2023-12-30 (×2): 40 meq via ORAL
  Filled 2023-12-30 (×2): qty 2

## 2023-12-30 MED ORDER — GUAIFENESIN-DM 100-10 MG/5ML PO SYRP
5.0000 mL | ORAL_SOLUTION | ORAL | Status: DC | PRN
  Filled 2023-12-30: qty 5

## 2023-12-30 MED ORDER — INSULIN GLARGINE-YFGN 100 UNIT/ML ~~LOC~~ SOLN
40.0000 [IU] | Freq: Every day | SUBCUTANEOUS | Status: DC
  Administered 2023-12-30 – 2024-01-04 (×6): 40 [IU] via SUBCUTANEOUS
  Filled 2023-12-30 (×6): qty 0.4

## 2023-12-30 MED ORDER — PERFLUTREN LIPID MICROSPHERE
1.0000 mL | INTRAVENOUS | Status: AC | PRN
  Administered 2023-12-30: 5 mL via INTRAVENOUS

## 2023-12-30 MED ORDER — DOXYCYCLINE HYCLATE 100 MG PO TABS
100.0000 mg | ORAL_TABLET | Freq: Two times a day (BID) | ORAL | Status: DC
  Administered 2023-12-30 – 2024-01-04 (×11): 100 mg via ORAL
  Filled 2023-12-30 (×11): qty 1

## 2023-12-30 MED ORDER — ORAL CARE MOUTH RINSE: 15.0000 mL | OROMUCOSAL | Status: DC | PRN

## 2023-12-30 MED ORDER — TERIPARATIDE 560 MCG/2.24ML ~~LOC~~ SOPN
20.0000 ug | PEN_INJECTOR | Freq: Every day | SUBCUTANEOUS | Status: DC
  Administered 2023-12-30 – 2024-01-04 (×6): 20 ug via SUBCUTANEOUS
  Filled 2023-12-30 (×8): qty 20

## 2023-12-30 MED ORDER — TERIPARATIDE 620 MCG/2.48ML ~~LOC~~ SOPN: 20.0000 ug | PEN_INJECTOR | Freq: Every day | SUBCUTANEOUS | Status: DC

## 2023-12-30 MED ORDER — ATORVASTATIN CALCIUM 80 MG PO TABS
80.0000 mg | ORAL_TABLET | Freq: Every day | ORAL | Status: DC
  Administered 2023-12-30 – 2024-01-05 (×7): 80 mg via ORAL
  Filled 2023-12-30 (×7): qty 1

## 2023-12-30 MED ORDER — METOPROLOL TARTRATE 12.5 MG HALF TABLET
12.5000 mg | ORAL_TABLET | Freq: Two times a day (BID) | ORAL | Status: DC
  Administered 2023-12-30 – 2024-01-05 (×14): 12.5 mg via ORAL
  Filled 2023-12-30 (×15): qty 1

## 2023-12-30 MED ORDER — TORSEMIDE 20 MG PO TABS
20.0000 mg | ORAL_TABLET | Freq: Every day | ORAL | Status: DC
  Administered 2023-12-30 – 2024-01-05 (×7): 20 mg via ORAL
  Filled 2023-12-30 (×7): qty 1

## 2023-12-30 MED ORDER — DOXYCYCLINE HYCLATE 100 MG PO TABS: 100.0000 mg | ORAL_TABLET | Freq: Two times a day (BID) | ORAL | Status: DC

## 2023-12-30 NOTE — Progress Notes (Signed)
 Heart Failure Navigator Progress Note  Assessed for Heart & Vascular TOC clinic readiness.  Patient does not meet criteria due to see's [facility]. No HF TOC. .   Navigator will sign off at this time.   Randie Bustle, BSN, Scientist, clinical (histocompatibility and immunogenetics) Only

## 2023-12-30 NOTE — Telephone Encounter (Signed)
 Pt's wife called to let us  know that the patient has been admitted to the hospital for pneumonia. They are running more test on him today. The patient will be exited from the cardiac rehab program until he is medically cleared to come back. Pt's wife voiced understanding. They will call when he is cleared to return.

## 2023-12-30 NOTE — Plan of Care (Signed)
 Plan of care is reviewed.  Problem: Clinical Measurements: Goal: Ability to maintain clinical measurements within normal limits will improve Outcome: Progressing Goal: Will remain free from infection Outcome: Progressing Goal: Diagnostic test results will improve Outcome: Progressing Goal: Respiratory complications will improve Outcome: Progressing Goal: Cardiovascular complication will be avoided Outcome: Progressing   Problem: Activity: Goal: Risk for activity intolerance will decrease Outcome: Progressing   Problem: Nutrition: Goal: Adequate nutrition will be maintained Outcome: Progressing   Problem: Pain Managment: Goal: General experience of comfort will improve and/or be controlled Outcome: Progressing   Problem: Elimination: Goal: Will not experience complications related to bowel motility Outcome: Progressing Goal: Will not experience complications related to urinary retention Outcome: Progressing   Problem: Skin Integrity: Goal: Risk for impaired skin integrity will decrease Outcome: Progressing    Problem: Metabolic: Goal: Ability to maintain appropriate glucose levels will improve Outcome: Progressing   Problem: Nutritional: Goal: Maintenance of adequate nutrition will improve Outcome: Progressing Goal: Progress toward achieving an optimal weight will improve Outcome: Progressing   Problem: Skin Integrity: Goal: Risk for impaired skin integrity will decrease Outcome: Progressing    Wendi Dash, RN

## 2023-12-30 NOTE — Progress Notes (Signed)
 Pt  transferred to [facility]. He is alert an d fully oriented x 4, afebrile, on room air, SPO2 98%, no SOB. Atrial fibrillation on the monitor, HR at 95-125, BP stable. Pt has a complaint of pleuritic chest pain scale  8/10. Dr. Shona is notified. The Pt's plan of care is reviewed. Pt has no obvious acute distress at arrival. We will continue to monitor.  Wendi Dash, RN

## 2023-12-30 NOTE — Evaluation (Signed)
 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   High cholesterol    History of methicillin resistant staphylococcus aureus (MRSA)    Hypertension    Reflux    Renal disorder    Sleep apnea    Past Surgical History:  Past Surgical History:  Procedure Laterality Date   CARDIOVERSION N/A 08/26/2023   Procedure: CARDIOVERSION;  Surgeon: Barbaraann Darryle Ned, MD;  Location: [facility] INVASIVE CV LAB;  Service: Cardiovascular;  Laterality: N/A;   CATARACT EXTRACTION     CERVICAL FUSION     CHOLECYSTECTOMY     KIDNEY STONE SURGERY     KNEE ARTHROSCOPY     HPI:  73yo male admitted 12/29/23 with palpitations, cough, left side chest pain. PMH: PAFib, chronic HFpEF, COPD, CKD3b, DM2, HTN, osteoporosis, CAD s/p PCI with stent placement, obesity, OSA/CPAP. CXR - LLL patchy opacities.    Assessment / Plan / Recommendation  Clinical Impression  Pt presents with suspected esophageal dysphagia. CN exam remarkable for slight right side facial weakness. Pt indicates he may have had a CVA in the past. Speech is fully intelligible. Pt indicates he strangles easily when eating/drinking, and that his wife constantly reminds him to slow down when eating. Pt underwent FEES in December 2024, with recommendation for regular solids/thin liquids. Small meds whole in puree, crush large pills in puree.   Today, pt accepted multiple trials of thin liquid, puree, and solid textures. Timely oral prep and clearing observed. Pt unable to consume 3oz  water without stopping. No overt s/s aspiration across textures, but pt reports throat/esophagus tightens up. History of GERD with daily PPI use, esophageal dilation in the past.   Recommend esophageal study to evaluate motility. SLP will follow up afterward to educate when results available. RN/MD informed of results and recommendations. Safe swallow precautions left with RN to post at [facility], as pt currently undergoing inroom testing.  SLP Visit Diagnosis: Dysphagia, unspecified (R13.10)    Aspiration Risk  Mild aspiration risk    Diet Recommendation Regular;Thin liquid    Liquid Administration via: Cup;Straw Medication Administration: Other (Comment) (small pills whole in puree, crush large pills in puree) Supervision: Patient able to self feed;Intermittent supervision to cue for compensatory strategies Compensations: Minimize environmental distractions;Slow rate;Small sips/bites Postural Changes: Remain upright for at least 30 minutes after po intake;Seated upright at 90 degrees    Other  Recommendations Recommended Consults: Consider esophageal assessment Oral Care Recommendations: Oral care BID     Assistance Recommended at Discharge  TBD  Functional Status Assessment Patient has not had a recent decline in their functional status  Frequency and Duration min 1 x/week  2 weeks;1 week       Prognosis Prognosis for improved oropharyngeal function: Good      Swallow Study   General Date of Onset: 12/29/23 HPI: 73yo male admitted 12/29/23 with palpitations, cough, left side chest pain. PMH: PAFib, chronic HFpEF, COPD, CKD3b, DM2, HTN, osteoporosis, CAD s/p PCI with stent placement,  obesity, OSA/CPAP. CXR - LLL patchy opacities. Type of Study: Bedside Swallow Evaluation Previous Swallow Assessment: FEES 06/04/23 - reg/ETC, thin, small meds whole/puree, large meds crushed. Diet Prior to this Study: Regular;Thin liquids (Level 0) Temperature Spikes Noted: No Respiratory Status: Nasal  cannula History of Recent Intubation: No Behavior/Cognition: Alert;Cooperative;Pleasant mood Oral Cavity Assessment: Within Functional Limits Oral Care Completed by SLP: No Oral Cavity - Dentition: Adequate natural dentition;Other (Comment) (lower partial plate) Vision: Functional for self-feeding Self-Feeding Abilities: Able to feed self Patient Positioning: Upright in bed Baseline Vocal Quality: Normal Volitional Cough: Strong Volitional Swallow: Able to elicit    Oral/Motor/Sensory Function Overall Oral Motor/Sensory Function: Generalized oral weakness Facial ROM: Reduced right Facial Symmetry: Abnormal symmetry right Facial Strength: Within Functional Limits Facial Sensation: Within Functional Limits Lingual ROM: Reduced right Lingual Symmetry: Abnormal symmetry right Lingual Strength: Within Functional Limits Lingual Sensation: Within Functional Limits   Ice Chips Ice chips: Not tested   Thin Liquid Thin Liquid: Within functional limits Presentation: Cup;Self Fed;Straw Other Comments: Pt unable to consume 3oz water without stopping - states his throat/esophagus tighten up. No overt s/s aspiration.    Nectar Thick Nectar Thick Liquid: Not tested   Honey Thick Honey Thick Liquid: Not tested   Puree Puree: Within functional limits Presentation: Self Fed;Spoon   Solid     Solid: Within functional limits Presentation: Self Fed     Mike Hunter, MSP, CCC-SLP Speech Language Pathologist Office: [phone]  Mike Hunter 12/30/2023,12:21 PM

## 2023-12-30 NOTE — Consult Note (Addendum)
 Cardiology Consultation   Patient ID: Mike Hunter MRN: [id number]; DOB: Dec 06, 1950  Admit date: 12/29/2023 Date of Consult: 12/30/2023  PCP:  [name], [name], MD   [facility] Providers Cardiologist: [facility]   Patient Profile: Mike Hunter is a 73 y.o. male with a hx of HFpEF, CAD s/p PCI with DES x 2 to mLAD on 11/06/2023, paroxysmal A-fib, moderate aortic stenosis, hypertension, hyperlipidemia, COPD, OSA on CPAP, carotid stenosis s/p left carotid stenting, CKD stage III and type 2 diabetes who is being seen 12/30/2023 for the evaluation of atrial fibrillation with RVR at the request of Darci Pore MD.  History of Present Illness: Mike Hunter is a 73 year old male who was previously seen by cardiology at [facility].  Echo on 05/2023 showed a normal LVEF of 60 to 65%, grade 3 diastolic dysfunction, moderately enlarged RV, mildly dilated LA, and small pericardial effusion.   Received a cardioversion on 08/2023. On 10/2023 at [facility] the patient had a dobutamine stress test ordered for aortic stenosis evaluation.  The stress test was positive for abnormal wall motion and chest pain.  Because of this the patient was sent for a left heart cath that found stenosis in the mid LAD and otherwise found nonobstructive CAD. He ended up receiving PCI with DES x 2 to the mid LAD. Aortic stenosis with mean gradient 23 mmHg by pullback   Presented to the emergency department on 12/29/2023 for chest pain and shortness of breath. On interview the patient reported having left sided chest pain and shortness of breath that started about 4-5 days ago. Has had chest pain in the past with atrial fibrillation. The chest pain is a sharp pain, is 7-8/10, and radiates to his left shoulder. It is not associated with exertion or inspiration. Reported that the pain was ongoing. Also has orthopnea.  Has been going to cardiac rehab 3 times a week and does 45 minutes on the orbital exercise machine.  Denies any  tobacco use, alcohol use, illicit substance use.  Is receiving antibiotics for community-acquired pneumonia.   High sensitivity troponins 27> 24, proBNP elevated at 3,390, microcytic anemia with hemoglobin of 9.8.  Hypokalemia with a potassium of 3.2, elevated creatinine of 1.85.  Thana panel was negative Chest x-ray showed cardiomegaly, and left basilar patchy opacities, which may represent atelectasis, aspiration, or pneumonia. 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 Authorizing Provider  albuterol  (PROVENTIL  HFA;VENTOLIN  HFA) 108 (90 BASE) MCG/ACT inhaler Inhale 2 puffs  into the lungs every 4 (four) hours as needed for wheezing.   Yes [provider]  budesonide  (PULMICORT ) 0.5 MG/2ML nebulizer solution Take 2 mLs by nebulization 2 (two) times daily.   Yes [provider]  budesonide -formoterol  (SYMBICORT) 160-4.5 MCG/ACT inhaler Inhale 2 puffs into the lungs 2 (two) times daily. 07/03/16  Yes [provider]  clopidogrel  (PLAVIX ) 75 MG tablet Take 1 tablet by mouth daily. 05/18/23 05/17/24 Yes [provider]   dextromethorphan -guaiFENesin  (MUCINEX  DM) 30-600 MG 12hr tablet Take 1 tablet by mouth 2 (two) times daily. Patient taking differently: Take 1 tablet by mouth daily as needed for cough. 06/10/23  Yes Cheryle Page, MD  donepezil  (ARICEPT ) 10 MG tablet Take 10 mg by mouth at bedtime.   Yes [provider]  insulin  aspart (NOVOLOG  FLEXPEN) 100 UNIT/ML injection Inject 26-44 Units into the skin See admin instructions. 5 units prior to meals for cbg>= 150  Sliding scale (base is 26u in the AM, 30u at noon, and  36-44 at night   Yes [provider]  Insulin  Degludec (TRESIBA  FLEXTOUCH [place]) Inject 40-50 Units into the skin See admin instructions. 40 morning, 50 night   Yes [provider]  isosorbide  mononitrate (IMDUR ) 30 MG 24 hr tablet Take 1 tablet by mouth daily. 01/22/22  Yes [provider]  latanoprost  (XALATAN ) 0.005 % ophthalmic solution Place 1 drop into both eyes at bedtime.     Yes [provider]  memantine  (NAMENDA ) 10 MG tablet Take 10 mg by mouth 2 (two) times daily.   Yes [provider]  montelukast  (SINGULAIR ) 10 MG tablet Take 10 mg by mouth at bedtime.   Yes [provider]  Multiple Vitamin (MULTIVITAMIN WITH MINERALS) TABS tablet Take 1 tablet by mouth daily.   Yes [provider]  Multiple Vitamins-Minerals (PRESERVISION AREDS PO) Take 1 tablet by mouth daily.   Yes [provider]  omeprazole (PRILOSEC) 40 MG capsule Take 1 capsule by mouth at bedtime. 02/02/17  Yes [provider]  oxybutynin  (DITROPAN -XL) 10 MG 24 hr tablet Take 10 mg by mouth daily.   Yes [provider]  sertraline  (ZOLOFT ) 100 MG tablet Take 100 mg by mouth daily.   Yes [provider]  spironolactone  (ALDACTONE ) 25 MG tablet Take 25 mg by mouth daily.   Yes [provider]  Suvorexant (BELSOMRA) 15 MG TABS Take 1 tablet by mouth at bedtime.   Yes [provider]  Teriparatide  620  MCG/2.48ML SOPN Inject 2.48 mLs into the skin See admin instructions. Give 2.48 ml subcutaneous 6 times a day per patient 11/26/23  Yes [provider]  torsemide  (DEMADEX ) 20 MG tablet Take 20-40 mg by mouth See admin instructions. Take two tablets by mouth in the morning and then take one tablet in the evening per patient and wife   Yes [provider]  NAPOLEON INHUB 250-50 MCG/ACT AEPB Inhale 1 puff into the lungs in the morning and at bedtime. 04/20/23  Yes [provider]  XARELTO  20 MG TABS tablet Take 20 mg by mouth daily with supper. 04/04/22  Yes [provider]  amiodarone  (PACERONE ) 200 MG tablet Take 2 tablets (400 mg total) by mouth 2 (two) times daily for 7 days, THEN 1 tablet (200 mg total) 2 (two) times daily for 23 days. 08/26/23 09/25/23  Pahwani, Ravi, MD  metoprolol  tartrate (LOPRESSOR ) 25 MG tablet Take 1 tablet (25 mg total) by mouth 2 (two) times daily. 08/26/23 09/25/23  Vernon Ranks, MD  mirabegron  ER (MYRBETRIQ ) 50 MG TB24 tablet Take 1 tablet by mouth daily. 12/05/22 11/30/23  [provider]    Scheduled Meds:  budesonide   2 mL Nebulization BID   clopidogrel   75 mg Oral Daily   doxycycline   100 mg Oral Q12H   fluticasone  furoate-vilanterol  1 puff Inhalation Daily   insulin  aspart  0-15 Units Subcutaneous TID WC   insulin  aspart  0-5 Units Subcutaneous QHS   insulin  glargine-yfgn  40 Units Subcutaneous Daily   latanoprost   1 drop Both Eyes QHS   metoprolol  tartrate  12.5 mg Oral BID   montelukast   10 mg Oral QHS   multivitamin with minerals  1 tablet Oral Daily   oxybutynin   10 mg Oral Daily   pantoprazole   40 mg Oral Daily   potassium chloride   40 mEq Oral Q4H   rivaroxaban   20 mg Oral Q supper   sertraline   100 mg Oral Daily   spironolactone   25 mg Oral Daily   Teriparatide   2.48 mL Subcutaneous See admin instructions   torsemide   20 mg Oral Daily   Continuous Infusions:  cefTRIAXone  (ROCEPHIN )  IV 1 g (12/30/23 0937)    PRN Meds: acetaminophen , guaiFENesin -dextromethorphan , HYDROmorphone  (DILAUDID ) injection, ipratropium-albuterol , melatonin, mouth rinse, oxyCODONE , polyethylene glycol, prochlorperazine   Allergies:    Allergies  Allergen Reactions   Buprenorphine Hcl Nausea And Vomiting    Other reaction(s): Nausea And Vomiting    Hydromorphone  Other (See Comments)    Other reaction(s): Mental Status Changes (intolerance) Other reaction(s): HALLUCINATIONS hallucinations hallucinations hallucinations hallucinations    Codeine Nausea And Vomiting   Mirtazapine     Other reaction(s): Other (See Comments) Nightmares & altered mental status    Morphine  And Codeine Nausea And Vomiting    Social History:   Social History   Socioeconomic History   Marital status: Married    Spouse name: Not on file   Number of children: Not on file   Years of education: Not on file   Highest education level: Not on file  Occupational History   Not on file  Tobacco Use   Smoking status: Former   Smokeless tobacco: Never  Vaping Use   Vaping status: Never Used  Substance and Sexual Activity   Alcohol use: No   Drug use: No   Sexual activity: Not on file  Other Topics Concern   Not on file  Social History Narrative   Not on file   Social Drivers of Health   Financial Resource Strain: Not on file  Food Insecurity: No Food Insecurity (12/30/2023)   Hunger Vital Sign    Worried About Running Out of Food in the Last Year: Never true    Ran Out of Food in the Last Year: Never true  Transportation Needs: No Transportation Needs (12/30/2023)   PRAPARE - Administrator, Civil Service (Medical): No    Lack of Transportation (Non-Medical): No  Physical Activity: Not on file  Stress: Not on file  Social Connections: Socially Integrated (12/30/2023)   Social Connection and Isolation Panel    Frequency of Communication with Friends and Family: Twice a week    Frequency of Social Gatherings with  Friends and Family: Three times a week    Attends Religious Services: More than 4 times per year    Active Member of Clubs or Organizations: Yes    Attends Banker Meetings: 1 to 4 times per year    Marital Status: Married  Intimate Partner Violence: Not At Risk (12/30/2023)   Humiliation, Afraid, Rape, and Kick questionnaire    Fear of Current or Ex-Partner: No    Emotionally Abused: No    Physically Abused: No    Sexually Abused: No    Family History:   History reviewed. No pertinent family history.   ROS:  Please see the history of present illness.   All other ROS reviewed and negative.     Physical Exam/Data: Vitals:   12/30/23 0407 12/30/23 0806 12/30/23 0921 12/30/23 1152  BP: 113/69 (!) 113/91 110/70 121/81  Pulse: 92 (!) 118 (!) 118 (!) 122  Resp: 20 20 16 17   Temp: 97.8 F (36.6 C) 98.2 F (36.8 C)  98 F (36.7 C)  TempSrc: Oral Oral  Oral  SpO2: 95% 99% 98% 100%  Weight:      Height:        Intake/Output Summary (Last 24 hours) at 12/30/2023 1229 Last data filed at 12/30/2023 1207 Gross per 24 hour  Intake 1461.17 ml  Output 1000 ml  Net 461.17 ml      12/30/2023    3:07 AM 12/30/2023    1:14 AM 12/29/2023    7:49 PM  Last 3 Weights  Weight (lbs) 268 lb 11.9 oz 270 lb 3.1 oz 270 lb 3.2 oz  Weight (kg) 121.9 kg 122.56 kg 122.562 kg     Body mass index is 37.48 kg/m.  General:  Well nourished, well developed, in no acute distress HEENT: normal Neck:  JVD limited by body habitus Vascular: No carotid bruits; Distal pulses 2+ bilaterally Cardiac:  normal S1, S2; RRR; 2/6 systolic murmur Lungs:  crackles present on bilateral lower lung lobes with left> right. Abd: soft, nontender, no hepatomegaly  Ext: no edema Musculoskeletal:  No deformities. Skin: warm and dry  Neuro:   no focal abnormalities noted Psych:  Normal affect   EKG:  The EKG was personally reviewed and demonstrates:  EKG showed atrial fibrillation with a rate of  134 Telemetry:  Telemetry was personally reviewed and demonstrates:  Was in atrial fibrillation with rates in the 90's to 110's converted to normal sinus rhythm with rates in the 70's today at 12:36 pm.  Relevant CV Studies: Echo pending  Laboratory Data: High Sensitivity Troponin:  No results for input(s): TROPONINIHS in the last 720 hours.   Chemistry Recent Labs  Lab 12/29/23 1232 12/30/23 0400  NA 139 141  K 3.6 3.2*  CL 102 105  CO2 24 25  GLUCOSE 189* 170*  BUN 28* 28*  CREATININE 1.82* 1.85*  CALCIUM  8.7* 8.4*  MG 2.5* 2.4  GFRNONAA 38* 38*  ANIONGAP 13 11    Recent Labs  Lab 12/29/23 1232  PROT 7.6  ALBUMIN 4.0  AST 26  ALT 32  ALKPHOS 111  BILITOT 0.4   Lipids No results for input(s): CHOL, TRIG, HDL, LABVLDL, LDLCALC, CHOLHDL in the last 168 hours.  Hematology Recent Labs  Lab 12/29/23 1232 12/30/23 0400  WBC 11.8* 9.4  RBC 5.08 4.48  HGB 11.3* 9.8*  HCT 38.3* 33.5*  MCV 75.4* 74.8*  MCH 22.2* 21.9*  MCHC 29.5* 29.3*  RDW 18.0* 17.7*  PLT 434* 325   Thyroid No results for input(s): TSH, FREET4 in the last 168 hours.  BNP Recent Labs  Lab 12/29/23 1232  PROBNP 3,395.0*    DDimer No results for input(s): DDIMER in the last 168 hours.  Radiology/Studies:  DG Chest Portable 1 View Result Date: 12/29/2023 CLINICAL  DATA:  Three day history of chest pain and palpitations EXAM: PORTABLE CHEST 1 VIEW COMPARISON:  Chest radiograph dated 11/06/2023 FINDINGS: Normal lung volumes. Left basilar patchy opacities. No pleural effusion or pneumothorax. Similar enlarged cardiomediastinal silhouette. Cervical spinal fixation hardware appears intact. IMPRESSION: 1. Left basilar patchy opacities, which may represent atelectasis, aspiration, or pneumonia. 2. Similar cardiomegaly. Electronically Signed   By: Limin  Xu M.D.   On: 12/29/2023 13:33     Assessment and Plan: Mike Hunter is a 73 y.o. male with a hx of HFpEF, CAD s/p PCI with DES x  2 to mLAD on 11/06/2023, paroxysmal A-fib, moderate aortic stenosis, hypertension, hyperlipidemia, COPD, OSA on CPAP, carotid stenosis s/p left carotid stenting, CKD stage III and type 2 diabetes who is being seen 12/30/2023 for the evaluation of atrial fibrillation with RVR at the request of Darci Pore MD.   Community-acquired pneumonia COPD Chest x-ray showed cardiomegaly, and left basilar patchy opacities, which may represent atelectasis, aspiration, or pneumonia. On antibiotics per primary  HFpEF Hypokalemia Moderate aortic stenosis proBNP elevated at 3,390, potassium of 3.2, magnesium  2.4, elevated creatinine of 1.85.  Has received potassium replacement Echo pending Continue torsemide  20 mg daily Continue spironolactone  25 mg daily Appears like was previously on Farxiga consider restarting this when renal function improves.  Paroxysmal A-fib Has a history of paroxysmal A-fib and had a cardioversion on 08/2023 appears like was also on amiodarone  after this cardioversion.  Patient reported having minimal problems with atrial fibrillation until about 5 days ago.  On telemetry converted back to sinus rhythm at 12:36 PM Continue metoprolol  12.5 mg twice daily Continue Xarelto  20 mg daily (creatinine clearance of 59.6)  CAD s/p PCI with DES x 2 to mLAD Hyperlipidemia Received PCI with DES x 2 to the LAD on 10/2023 at [facility].  No need for aspirin  as it is on Xarelto  for atrial fibrillation Continue Plavix  75 mg daily At last cardiac visit was on atorvastatin  80 mg it is unclear why patient is no longer taking this. Restart atorvastatin  80 mg daily.    Risk Assessment/Risk Scores:       New York  Heart Association (NYHA) Functional Class NYHA Class II  CHA2DS2-VASc Score = 6   This indicates a 9.7% annual risk of stroke. The patient's score is based upon: CHF History: 1 HTN History: 1 Diabetes History: 1 Stroke History: 2 Vascular Disease History: 0 Age Score:  1 Gender Score: 0     [facility] will sign off.   The patient is ready for discharge today from a cardiac standpoint. Medication Recommendations:  restart atorvastatin  Other recommendations (labs, testing, etc):  none Follow up as an outpatient:  follow up with [facility]  For questions or updates, please contact [facility] Please consult [email or web address] for contact info under    Signed, Morse Clause, PA-C  12/30/2023 12:29 PM  Personally seen and examined. Agree with above.  73 year old with moderate aortic stenosis calcific aortic valve PCI x 2 to the mid LAD just recently in May 2025 also on Xarelto  for paroxysmal atrial fibrillation status post cardioversion here on 08/2023.  Came here for pneumonia as well as atrial fibrillation.  By the time we saw the patient, he was back in normal rhythm, auto converted.  His amiodarone  was stopped approximately a month ago by his primary cardiologist.  Agree that he should not be on amiodarone  long-term unless absolutely necessary.  He has seen Dr. Epifanio in the past.  Troponins  were low and flat not consistent with ACS at 27 and 24.  Chest x-ray demonstrated left basilar patchy opacities.  Original EKG showed A-fib rate 134.  Echocardiogram preliminarily reviewed here, mean gradient more in the moderate range for aortic stenosis.  Paroxysmal atrial fibrillation - Thankfully auto converted.  Continue with metoprolol .  Continue with Xarelto .  20 mg a day seems reasonable given his creatinine clearance of 60. -Can always discuss further possible options with electrophysiologist at [facility].  He mentioned Dr. Epifanio.  Coronary disease - Stable recent DES x 2 to mid LAD.  No evidence of acute coronary syndrome.  Continue with atorvastatin  80 mg a day.  COPD/possible community-acquired pneumonia - Per primary team.  Moderate aortic stenosis - Murmur heard on exam.  Continue to be followed at [facility].  Okay  with discharge from cardiology perspective.  Oneil Parchment, MD

## 2023-12-30 NOTE — Progress Notes (Signed)
 Progress Note   Patient: Mike Hunter DOB: [birth year] DOA: 12/29/2023     1 DOS: the patient was seen and examined on 12/30/2023   Brief hospital course: Mike Hunter is a 73 y.o. male with medical history significant for paroxysmal A-fib on Xarelto , chronic HFpEF, COPD, CKD 3B, type 2 diabetes, hypertension, osteoporosis on teriparatide , coronary artery disease status post PCI with stent placement, obesity, OSA on CPAP, who initially presented at [facility] ED due to left-sided chest pain, worse with coughing, and associated with palpitations.  Chest x-ray with left-sided infiltrate versus atelectasis.  Patient is admitted to the hospitalist service with the impression of left lower lobe pneumonia, paroxysmal atrial fibrillation with RVR, acute on chronic HFpEF.  Cardiology consulted.  Assessment and Plan: Community-acquired pneumonia- Patient will be continued on Rocephin , doxycycline  therapy. Continue supplemental oxygen to maintain saturation greater than 92%. Continue bronchodilators. Continue inhalers Singulair  for COPD.  A-fib with RVR: Heart rate 120-130, auto converted. Cardiology follow-up appreciated. Continue metoprolol  and Xarelto .  Coronary artery disease status post stent DES x 2 to mid LAD. His chest pain is atypical.  Continue Plavix , statin therapy.  Chronic HFpEF: Patient got 1 dose of IV Lasix  in ED. He is euvolemic.  Continue home dose torsemide , beta-blocker, Aldactone .  Hypokalemia: Likely from IV diuresis he received in ED. Oral potassium supplements ordered. Continue to monitor daily potassium.  Type 2 diabetes mellitus: Accu-Cheks, sliding scale insulin .  Semglee  40 units ordered.  CKD stage IIIb: Creatinine baseline 1.5-1.8. Monitor daily renal function, electrolytes. Avoid nephrotoxic risk.  Obesity class II BMI 37.48 Diet, exercise and weight reduction advised.  OSA on CPAP.      Out of bed to chair. Incentive  spirometry. Nursing supportive care. Fall, aspiration precautions. Diet:  Diet Orders (From admission, onward)     Start     Ordered   12/29/23 2119  Diet heart healthy/carb modified Room service appropriate? Yes with Assist; Fluid consistency: Thin  Diet effective now       Question Answer Comment  Diet-HS Snack? Nothing   Room service appropriate? Yes with Assist   Fluid consistency: Thin      12/29/23 2118           DVT prophylaxis:  rivaroxaban  (XARELTO ) tablet 20 mg  Level of care: Telemetry Cardiac   Code Status: Full Code  Subjective: Patient is seen and examined today morning.  He is lying comfortably.  Does have pleuritic chest pain, chest wall tenderness.  Heart rate 120, A-fib on the monitor.  Did not get out of bed.  He is on 2 L supplemental oxygen.  CPAP at bedside.  Physical Exam: Vitals:   12/30/23 0921 12/30/23 1152 12/30/23 1240 12/30/23 1629  BP: 110/70 121/81  99/72  Pulse: (!) 118 (!) 122 76 74  Resp: 16 17  16   Temp:  98 F (36.7 C)  98.5 F (36.9 C)  TempSrc:  Oral  Oral  SpO2: 98% 100% 99% 99%  Weight:      Height:        General - Elderly obese Caucasian male, mild respiratory distress HEENT - PERRLA, EOMI, atraumatic head, non tender sinuses. Lung - Clear, diffuse rales, rhonchi on left.  No wheezes. Heart - S1, S2 heard, no murmurs, rubs, trace pedal edema. Abdomen - Soft, non tender obese, bowel sounds good Neuro - Alert, awake and oriented x 3, non focal exam. Skin - Warm and dry.  Data Reviewed:  Latest Ref Rng & Units 12/30/2023    4:00 AM 12/29/2023   12:32 PM 08/26/2023    4:00 AM  CBC  WBC 4.0 - 10.5 K/uL 9.4  11.8  13.8   Hemoglobin 13.0 - 17.0 g/dL 9.8  88.6  89.6   Hematocrit 39.0 - 52.0 % 33.5  38.3  34.6   Platelets 150 - 400 K/uL 325  434  379       Latest Ref Rng & Units 12/30/2023    4:00 AM 12/29/2023   12:32 PM 08/26/2023    4:00 AM  BMP  Glucose 70 - 99 mg/dL 829  810  800   BUN 8 - 23 mg/dL 28  28  16     Creatinine 0.61 - 1.24 mg/dL 8.14  8.17  8.41   Sodium 135 - 145 mmol/L 141  139  140   Potassium 3.5 - 5.1 mmol/L 3.2  3.6  3.1   Chloride 98 - 111 mmol/L 105  102  107   CO2 22 - 32 mmol/L 25  24  27    Calcium  8.9 - 10.3 mg/dL 8.4  8.7  8.6    ECHOCARDIOGRAM COMPLETE Result Date: 12/30/2023    ECHOCARDIOGRAM REPORT   Patient Name:   Mike Hunter Date of Exam: 12/30/2023 Medical Rec #:  [id number]     Height:       71.0 in Accession #:    [id number]    Weight:       268.7 lb Date of Birth:  28-Sep-1950      BSA:          2.391 m Patient Age:    72 years      BP:           113/69 mmHg Patient Gender: M             HR:           99 bpm. Exam Location:  Inpatient Procedure: 2D Echo, Cardiac Doppler, Color Doppler and Intracardiac            Opacification Agent (Both Spectral and Color Flow Doppler were            utilized during procedure). Indications:    CHF- Acute Diastolic I50.31  History:        Patient has prior history of Echocardiogram examinations, most                 recent 06/09/2023. CHF, CAD, Prior CABG, Chronic Kidney Disease                 and COPD; Risk Factors:Diabetes and Sleep Apnea.  Sonographer:    Koleen Popper RDCS Referring Phys: [id number] TERRY SAILOR HALL  Sonographer Comments: Suboptimal apical window. IMPRESSIONS  1. Left ventricular ejection fraction, by estimation, is 55 to 60%. The left ventricle has normal function. The left ventricle demonstrates global hypokinesis. There is moderate asymmetric left ventricular hypertrophy of the septal segment. Left ventricular diastolic parameters are indeterminate.  2. Right ventricular systolic function is normal. The right ventricular size is normal. Tricuspid regurgitation signal is inadequate for assessing PA pressure.  3. The mitral valve is normal in structure. Mild mitral valve regurgitation. No evidence of mitral stenosis.  4. The aortic valve is heavily calcified. SVI 20, DI 0.47. The aortic valve is calcified. There is moderate  calcification of the aortic valve. There is mild thickening of the aortic valve. Aortic valve regurgitation is not visualized. Mild to moderate  aortic valve stenosis.  5. Aortic dilatation noted. There is mild dilatation of the ascending aorta, measuring 36 mm.  6. The inferior vena cava is normal in size with greater than 50% respiratory variability, suggesting right atrial pressure of 3 mmHg. FINDINGS  Left Ventricle: Left ventricular ejection fraction, by estimation, is 55 to 60%. The left ventricle has normal function. The left ventricle demonstrates global hypokinesis. Definity  contrast agent was given IV to delineate the left ventricular endocardial borders. The left ventricular internal cavity size was normal in size. There is moderate asymmetric left ventricular hypertrophy of the septal segment. Left ventricular diastolic parameters are indeterminate. Right Ventricle: The right ventricular size is normal. No increase in right ventricular wall thickness. Right ventricular systolic function is normal. Tricuspid regurgitation signal is inadequate for assessing PA pressure. Left Atrium: Left atrial size was normal in size. Right Atrium: Right atrial size was normal in size. Pericardium: There is no evidence of pericardial effusion. Presence of epicardial fat layer. Mitral Valve: The mitral valve is normal in structure. Mild mitral valve regurgitation. No evidence of mitral valve stenosis. Tricuspid Valve: The tricuspid valve is normal in structure. Tricuspid valve regurgitation is not demonstrated. No evidence of tricuspid stenosis. Aortic Valve: The aortic valve is heavily calcified. SVI 20, DI 0.47. The aortic valve is calcified. There is moderate calcification of the aortic valve. There is mild thickening of the aortic valve. Aortic valve regurgitation is not visualized. Mild to moderate aortic stenosis is present. Aortic valve mean gradient measures 7.7 mmHg. Aortic valve peak gradient measures 11.6 mmHg.  Aortic valve area, by VTI measures 1.79 cm. Pulmonic Valve: The pulmonic valve was normal in structure. Pulmonic valve regurgitation is not visualized. No evidence of pulmonic stenosis. Aorta: Aortic dilatation noted. There is mild dilatation of the ascending aorta, measuring 36 mm. Venous: The inferior vena cava is normal in size with greater than 50% respiratory variability, suggesting right atrial pressure of 3 mmHg. IAS/Shunts: No atrial level shunt detected by color flow Doppler.  LEFT VENTRICLE PLAX 2D LVIDd:         5.70 cm LVIDs:         4.50 cm LV PW:         1.20 cm LV IVS:        1.40 cm LVOT diam:     2.20 cm LV SV:         49 LV SV Index:   20 LVOT Area:     3.80 cm  IVC IVC diam: 2.20 cm LEFT ATRIUM             Index LA diam:        4.90 cm 2.05 cm/m LA Vol (A2C):   70.0 ml 29.28 ml/m LA Vol (A4C):   62.1 ml 25.98 ml/m LA Biplane Vol: 68.6 ml 28.70 ml/m  AORTIC VALVE AV Area (Vmax):    1.84 cm AV Area (Vmean):   1.82 cm AV Area (VTI):     1.79 cm AV Vmax:           170.50 cm/s AV Vmean:          115.733 cm/s AV VTI:            0.272 m AV Peak Grad:      11.6 mmHg AV Mean Grad:      7.7 mmHg LVOT Vmax:         82.70 cm/s LVOT Vmean:        55.300 cm/s LVOT VTI:  0.128 m LVOT/AV VTI ratio: 0.47  AORTA Ao Root diam: 3.00 cm Ao Asc diam:  3.70 cm MITRAL VALVE MV Area (PHT): 3.36 cm    SHUNTS MV Decel Time: 226 msec    Systemic VTI:  0.13 m MV E velocity: 80.60 cm/s  Systemic Diam: 2.20 cm MV A velocity: 32.60 cm/s MV E/A ratio:  2.47 Kardie Tobb DO Electronically signed by Dub Huntsman DO Signature Date/Time: 12/30/2023/3:55:33 PM    Final    DG Chest Portable 1 View Result Date: 12/29/2023 CLINICAL DATA:  Three day history of chest pain and palpitations EXAM: PORTABLE CHEST 1 VIEW COMPARISON:  Chest radiograph dated 11/06/2023 FINDINGS: Normal lung volumes. Left basilar patchy opacities. No pleural effusion or pneumothorax. Similar enlarged cardiomediastinal silhouette. Cervical spinal  fixation hardware appears intact. IMPRESSION: 1. Left basilar patchy opacities, which may represent atelectasis, aspiration, or pneumonia. 2. Similar cardiomegaly. Electronically Signed   By: Limin  Xu M.D.   On: 12/29/2023 13:33    Family Communication: Discussed with patient, understand and agree. All questions answered.  Disposition: Status is: Inpatient Remains inpatient appropriate because: A-fib with RVR, IV antibiotics for pneumonia  Planned Discharge Destination: Home with Home Health     Time spent: 44 minutes  Author: Concepcion Riser, MD 12/30/2023 6:43 PM Secure chat 7am to 7pm For on call review [email or web address].

## 2023-12-30 NOTE — Progress Notes (Signed)
   12/30/23 0921  Vitals  BP 110/70  MAP (mmHg) 82  BP Location Right Arm  BP Method Automatic  Patient Position (if appropriate) Lying  Pulse Rate (!) 118  Pulse Rate Source Monitor  ECG Heart Rate (!) 119  Resp 16  MEWS COLOR  MEWS Score Color Yellow  Oxygen Therapy  SpO2 98 %  O2 Device Nasal Cannula  O2 Flow Rate (L/min) 2 L/min  Pain Assessment  Pain Scale 0-10  Pain Score 8  Pain Type Acute pain  Pain Location Chest  Pain Orientation Left  Pain Descriptors / Indicators Aching  Pain Frequency Intermittent  Pain Onset On-going  Pain Intervention(s) Medication (See eMAR);RN made aware  MEWS Score  MEWS Temp 0  MEWS Systolic 0  MEWS Pulse 2  MEWS RR 0  MEWS LOC 0  MEWS Score 2   Patient has a chronic yellow MEWS to elevated HR. RN to give scheduled PO metoprolol . Patient asymptomatic, lying in bed.

## 2023-12-30 NOTE — Progress Notes (Signed)
  Echocardiogram 2D Echocardiogram has been performed.  Koleen KANDICE Popper, RDCS 12/30/2023, 12:38 PM

## 2023-12-31 DIAGNOSIS — I5032 Chronic diastolic (congestive) heart failure: Secondary | ICD-10-CM | POA: Diagnosis not present

## 2023-12-31 DIAGNOSIS — N1832 Chronic kidney disease, stage 3b: Secondary | ICD-10-CM | POA: Diagnosis not present

## 2023-12-31 DIAGNOSIS — I4891 Unspecified atrial fibrillation: Secondary | ICD-10-CM | POA: Diagnosis not present

## 2023-12-31 DIAGNOSIS — J189 Pneumonia, unspecified organism: Secondary | ICD-10-CM | POA: Diagnosis not present

## 2023-12-31 LAB — CBC
HCT: 33.8 % — ABNORMAL LOW (ref 39.0–52.0)
Hemoglobin: 9.8 g/dL — ABNORMAL LOW (ref 13.0–17.0)
MCH: 22.3 pg — ABNORMAL LOW (ref 26.0–34.0)
MCHC: 29 g/dL — ABNORMAL LOW (ref 30.0–36.0)
MCV: 77 fL — ABNORMAL LOW (ref 80.0–100.0)
Platelets: 320 K/uL (ref 150–400)
RBC: 4.39 MIL/uL (ref 4.22–5.81)
RDW: 17.6 % — ABNORMAL HIGH (ref 11.5–15.5)
WBC: 12.8 K/uL — ABNORMAL HIGH (ref 4.0–10.5)
nRBC: 0 % (ref 0.0–0.2)

## 2023-12-31 LAB — GLUCOSE, CAPILLARY
Glucose-Capillary: 191 mg/dL — ABNORMAL HIGH (ref 70–99)
Glucose-Capillary: 170 mg/dL — ABNORMAL HIGH (ref 70–99)
Glucose-Capillary: 154 mg/dL — ABNORMAL HIGH (ref 70–99)
Glucose-Capillary: 123 mg/dL — ABNORMAL HIGH (ref 70–99)

## 2023-12-31 LAB — BASIC METABOLIC PANEL WITH GFR
Anion gap: 11 (ref 5–15)
BUN: 28 mg/dL — ABNORMAL HIGH (ref 8–23)
CO2: 23 mmol/L (ref 22–32)
Calcium: 8.9 mg/dL (ref 8.9–10.3)
Chloride: 106 mmol/L (ref 98–111)
Creatinine, Ser: 1.76 mg/dL — ABNORMAL HIGH (ref 0.61–1.24)
GFR, Estimated: 40 mL/min — ABNORMAL LOW (ref >60)
Glucose, Bld: 177 mg/dL — ABNORMAL HIGH (ref 70–99)
Potassium: 4.1 mmol/L (ref 3.5–5.1)
Sodium: 140 mmol/L (ref 135–145)

## 2023-12-31 MED ORDER — DOCUSATE SODIUM 100 MG PO CAPS: 100.0000 mg | ORAL_CAPSULE | Freq: Two times a day (BID) | ORAL | Status: DC

## 2023-12-31 MED ORDER — SENNOSIDES-DOCUSATE SODIUM 8.6-50 MG PO TABS
2.0000 | ORAL_TABLET | Freq: Every day | ORAL | Status: DC
  Administered 2023-12-31 – 2024-01-04 (×5): 2 via ORAL
  Filled 2023-12-31 (×5): qty 2

## 2023-12-31 MED ORDER — DOCUSATE SODIUM 100 MG PO CAPS
100.0000 mg | ORAL_CAPSULE | Freq: Every day | ORAL | Status: DC
  Administered 2023-12-31 – 2024-01-05 (×6): 100 mg via ORAL
  Filled 2023-12-31 (×6): qty 1

## 2023-12-31 NOTE — TOC Initial Note (Signed)
 Transition of Care [facility]) - Initial/Assessment Note    Patient Details  Name: Mike Hunter MRN: [id number] Date of Birth: Apr 07, 1951  [facility] CM/SW Contact:    Sudie Erminio Deems, RN Phone Number: 12/31/2023, 11:00 AM  Clinical Narrative: Risk for readmission assessment completed. Patient presented for palpitations, cough and chest pain. PTA patient states he was from home with spouse. Patient states he has DME rolling walker and walking stick. Patient states his spouse transports him to appointments. Patient has had [facility] for services in the past. Patient states he is currently enrolled in the Heart Stride Program at [facility]. Case Manager will continue to follow for transition of care needs.    Expected Discharge Plan: Home/Self Care Barriers to Discharge: Continued Medical Work up   Patient Goals and CMS Choice Patient states their goals for this hospitalization and ongoing recovery are:: Plan to return home once stable.          Expected Discharge Plan and Services In-house Referral: NA Discharge Planning Services: CM Consult Post Acute Care Choice: NA Living arrangements for the past 2 months: Single Family Home                   DME Agency: NA       HH Arranged: NA          Prior Living Arrangements/Services Living arrangements for the past 2 months: Single Family Home Lives with:: Spouse Patient language and need for interpreter reviewed:: Yes Do you feel safe going back to the place where you live?: Yes      Need for Family Participation in Patient Care: Yes (Comment) Care giver support system in place?: Yes (comment) Current home services: DME (rolling walker and a walking stick)    Activities of Daily Living   ADL Screening (condition at time of admission) Independently performs ADLs?: No Does the patient have a NEW difficulty with bathing/dressing/toileting/self-feeding that is expected to last >3 days?:  No Does the patient have a NEW difficulty with getting in/out of bed, walking, or climbing stairs that is expected to last >3 days?: No Does the patient have a NEW difficulty with communication that is expected to last >3 days?: No Is the patient deaf or have difficulty hearing?: Yes Does the patient have difficulty seeing, even when wearing glasses/contacts?: No Does the patient have difficulty concentrating, remembering, or making decisions?: No  Permission Sought/Granted Permission sought to share information with : Family Supports, Case Manager                Emotional Assessment Appearance:: Appears stated age Attitude/Demeanor/Rapport: Engaged Affect (typically observed): Appropriate Orientation: : Oriented to Self, Oriented to Place, Oriented to  Time, Oriented to Situation Alcohol / Substance Use: Not Applicable Psych Involvement: No (comment)  Admission diagnosis:  Sinus tachycardia [R00.0] CAP (community acquired pneumonia) [J18.9] Community acquired pneumonia of left lower lobe of lung [J18.9] Patient Active Problem List   Diagnosis Date Noted   Atrial fibrillation with rapid ventricular response (HCC) 08/24/2023   Moderate persistent asthma with exacerbation 06/07/2023   COPD with acute exacerbation (HCC) 06/07/2023   CAP (community acquired pneumonia) 06/07/2023   Sepsis due to pneumonia (HCC) 06/06/2023   Anemia due to stage 3b chronic kidney disease (HCC) 08/01/2021   Hypokalemia 09/26/2018   Hypomagnesemia 09/26/2018   Acute on chronic diastolic CHF (congestive heart failure) (HCC) 09/26/2018   CAD (coronary artery disease) 09/26/2018   Asthma 09/26/2018   Depression  with anxiety 09/26/2018   Asthma with status asthmaticus    Acute kidney injury superimposed on stage 3b chronic kidney disease (HCC) 09/25/2018   Benign prostatic hyperplasia 01/17/2014   Type II or unspecified type diabetes mellitus with neurological manifestations, not stated as  uncontrolled(250.60) 02/09/2013   Unspecified urinary incontinence 02/09/2013   Esophageal reflux 02/09/2013   Insulin -requiring or dependent type II diabetes mellitus (HCC) 12/30/2012   Pyogenic arthritis of knee (HCC) 12/30/2012   PCP:  Nikki Hansel Atlas, MD Pharmacy:   [id number] - [place], [place] - [address] [place] [address] Phone: [phone] Fax: [phone]  [facility] [address] [place] [address] Phone: [phone] Fax: [phone]  [id number], [place] - [address] [facility] [place] [address] Phone: [phone] Fax: [phone]     Social Drivers of Health (SDOH) Social History: SDOH Screenings   Food Insecurity: No Food Insecurity (12/30/2023)  Housing: Low Risk  (12/30/2023)  Transportation Needs: No Transportation Needs (12/30/2023)  Utilities: Not At Risk (12/30/2023)  Social Connections: Socially Integrated (12/30/2023)  Tobacco Use: Medium Risk (12/29/2023)   SDOH Interventions:     Readmission Risk Interventions    12/31/2023   10:58 AM  Readmission Risk Prevention Plan  Transportation Screening Complete  Medication Review (RN Care Manager) Referral to Pharmacy  HRI or Home Care Consult Complete  SW Recovery Care/Counseling Consult Complete  Palliative Care Screening Not Applicable  Skilled Nursing Facility Not Applicable

## 2023-12-31 NOTE — Progress Notes (Signed)
 Progress Note   Patient: Mike Hunter FMW:[id number] DOB: 30-Dec-1950 DOA: 12/29/2023     2 DOS: the patient was seen and examined on 12/31/2023   Brief hospital course: Mike Hunter is a 73 y.o. male with medical history significant for paroxysmal A-fib on Xarelto , chronic HFpEF, COPD, CKD 3B, type 2 diabetes, hypertension, osteoporosis on teriparatide , coronary artery disease status post PCI with stent placement, obesity, OSA on CPAP, who initially presented at [facility] ED due to left-sided chest pain, worse with coughing, and associated with palpitations.  Chest x-ray with left-sided infiltrate versus atelectasis.  Patient is admitted to the hospitalist service with the impression of left lower lobe pneumonia, paroxysmal atrial fibrillation with RVR, acute on chronic HFpEF.  Cardiology consulted.  Assessment and Plan: Community-acquired pneumonia- Patient will be continued on Rocephin , doxycycline  therapy. Sputum cultures ordered. Blood cultures so far negative. Continue supplemental oxygen to maintain saturation greater than 92%. Continue bronchodilators. Continue inhalers Singulair  for COPD.  Paroxysmal A-fib: Heart rate improved. Cardiology advised no further work up. Continue metoprolol  and Xarelto .  Coronary artery disease status post stent DES x 2 to mid LAD. His chest pain is atypical.  Continue Plavix , statin therapy.  Chronic HFpEF: Patient got 1 dose of IV Lasix  in ED. He is euvolemic.  Continue home dose torsemide , beta-blocker, Aldactone .  Hypokalemia: Likely from IV diuresis he received in ED. Oral potassium supplements ordered. Continue to monitor daily potassium.  Type 2 diabetes mellitus: Accu-Cheks, sliding scale insulin .  Semglee  40 units ordered.  CKD stage IIIb: Creatinine baseline 1.5-1.8. Monitor daily renal function, electrolytes. Avoid nephrotoxic risk.  Obesity class II BMI 37.48 Diet, exercise and weight reduction advised.  OSA on  CPAP.      Out of bed to chair. Incentive spirometry. Nursing supportive care. Fall, aspiration precautions. Diet:  Diet Orders (From admission, onward)     Start     Ordered   12/29/23 2119  Diet heart healthy/carb modified Room service appropriate? Yes with Assist; Fluid consistency: Thin  Diet effective now       Question Answer Comment  Diet-HS Snack? Nothing   Room service appropriate? Yes with Assist   Fluid consistency: Thin      12/29/23 2118           DVT prophylaxis:  rivaroxaban  (XARELTO ) tablet 20 mg  Level of care: Telemetry Cardiac   Code Status: Full Code  Subjective: Patient is seen and examined today morning.  He is lying comfortably.  Feels weak. Did not get out of bed. Eating fair. He is off supplemental oxygen.  Physical Exam: Vitals:   12/31/23 0550 12/31/23 0809 12/31/23 0902 12/31/23 1217  BP: 125/74 115/66  124/62  Pulse: 67 69  68  Resp: 16 15 17 19   Temp: 98.6 F (37 C) 98.4 F (36.9 C)  98 F (36.7 C)  TempSrc: Oral Oral  Oral  SpO2: 100% 99%  100%  Weight: 122.4 kg     Height:        General - Elderly obese Caucasian male, no respiratory distress HEENT - PERRLA, EOMI, atraumatic head, non tender sinuses. Lung - Clear, diffuse rales, rhonchi on left.  No wheezes. Heart - S1, S2 heard, no murmurs, rubs, trace pedal edema. Abdomen - Soft, non tender obese, bowel sounds good Neuro - Alert, awake and oriented x 3, non focal exam. Skin - Warm and dry.  Data Reviewed:      Latest Ref Rng & Units 12/31/2023    4:50  AM 12/30/2023    4:00 AM 12/29/2023   12:32 PM  CBC  WBC 4.0 - 10.5 K/uL 12.8  9.4  11.8   Hemoglobin 13.0 - 17.0 g/dL 9.8  9.8  88.6   Hematocrit 39.0 - 52.0 % 33.8  33.5  38.3   Platelets 150 - 400 K/uL 320  325  434       Latest Ref Rng & Units 12/31/2023    4:50 AM 12/30/2023    4:00 AM 12/29/2023   12:32 PM  BMP  Glucose 70 - 99 mg/dL 822  829  810   BUN 8 - 23 mg/dL 28  28  28    Creatinine 0.61 - 1.24 mg/dL  8.23  8.14  8.17   Sodium 135 - 145 mmol/L 140  141  139   Potassium 3.5 - 5.1 mmol/L 4.1  3.2  3.6   Chloride 98 - 111 mmol/L 106  105  102   CO2 22 - 32 mmol/L 23  25  24    Calcium  8.9 - 10.3 mg/dL 8.9  8.4  8.7    ECHOCARDIOGRAM COMPLETE Result Date: 12/30/2023    ECHOCARDIOGRAM REPORT   Patient Name:   Mike Hunter Date of Exam: 12/30/2023 Medical Rec #:  [id number]     Height:       71.0 in Accession #:    [id number]    Weight:       268.7 lb Date of Birth:  26-Jan-1951      BSA:          2.391 m Patient Age:    72 years      BP:           113/69 mmHg Patient Gender: M             HR:           99 bpm. Exam Location:  Inpatient Procedure: 2D Echo, Cardiac Doppler, Color Doppler and Intracardiac            Opacification Agent (Both Spectral and Color Flow Doppler were            utilized during procedure). Indications:    CHF- Acute Diastolic I50.31  History:        Patient has prior history of Echocardiogram examinations, most                 recent 06/09/2023. CHF, CAD, Prior CABG, Chronic Kidney Disease                 and COPD; Risk Factors:Diabetes and Sleep Apnea.  Sonographer:    Koleen Popper RDCS Referring Phys: [id number] TERRY SAILOR HALL  Sonographer Comments: Suboptimal apical window. IMPRESSIONS  1. Left ventricular ejection fraction, by estimation, is 55 to 60%. The left ventricle has normal function. The left ventricle demonstrates global hypokinesis. There is moderate asymmetric left ventricular hypertrophy of the septal segment. Left ventricular diastolic parameters are indeterminate.  2. Right ventricular systolic function is normal. The right ventricular size is normal. Tricuspid regurgitation signal is inadequate for assessing PA pressure.  3. The mitral valve is normal in structure. Mild mitral valve regurgitation. No evidence of mitral stenosis.  4. The aortic valve is heavily calcified. SVI 20, DI 0.47. The aortic valve is calcified. There is moderate calcification of the aortic valve.  There is mild thickening of the aortic valve. Aortic valve regurgitation is not visualized. Mild to moderate aortic valve stenosis.  5. Aortic dilatation noted. There is  mild dilatation of the ascending aorta, measuring 36 mm.  6. The inferior vena cava is normal in size with greater than 50% respiratory variability, suggesting right atrial pressure of 3 mmHg. FINDINGS  Left Ventricle: Left ventricular ejection fraction, by estimation, is 55 to 60%. The left ventricle has normal function. The left ventricle demonstrates global hypokinesis. Definity  contrast agent was given IV to delineate the left ventricular endocardial borders. The left ventricular internal cavity size was normal in size. There is moderate asymmetric left ventricular hypertrophy of the septal segment. Left ventricular diastolic parameters are indeterminate. Right Ventricle: The right ventricular size is normal. No increase in right ventricular wall thickness. Right ventricular systolic function is normal. Tricuspid regurgitation signal is inadequate for assessing PA pressure. Left Atrium: Left atrial size was normal in size. Right Atrium: Right atrial size was normal in size. Pericardium: There is no evidence of pericardial effusion. Presence of epicardial fat layer. Mitral Valve: The mitral valve is normal in structure. Mild mitral valve regurgitation. No evidence of mitral valve stenosis. Tricuspid Valve: The tricuspid valve is normal in structure. Tricuspid valve regurgitation is not demonstrated. No evidence of tricuspid stenosis. Aortic Valve: The aortic valve is heavily calcified. SVI 20, DI 0.47. The aortic valve is calcified. There is moderate calcification of the aortic valve. There is mild thickening of the aortic valve. Aortic valve regurgitation is not visualized. Mild to moderate aortic stenosis is present. Aortic valve mean gradient measures 7.7 mmHg. Aortic valve peak gradient measures 11.6 mmHg. Aortic valve area, by VTI measures  1.79 cm. Pulmonic Valve: The pulmonic valve was normal in structure. Pulmonic valve regurgitation is not visualized. No evidence of pulmonic stenosis. Aorta: Aortic dilatation noted. There is mild dilatation of the ascending aorta, measuring 36 mm. Venous: The inferior vena cava is normal in size with greater than 50% respiratory variability, suggesting right atrial pressure of 3 mmHg. IAS/Shunts: No atrial level shunt detected by color flow Doppler.  LEFT VENTRICLE PLAX 2D LVIDd:         5.70 cm LVIDs:         4.50 cm LV PW:         1.20 cm LV IVS:        1.40 cm LVOT diam:     2.20 cm LV SV:         49 LV SV Index:   20 LVOT Area:     3.80 cm  IVC IVC diam: 2.20 cm LEFT ATRIUM             Index LA diam:        4.90 cm 2.05 cm/m LA Vol (A2C):   70.0 ml 29.28 ml/m LA Vol (A4C):   62.1 ml 25.98 ml/m LA Biplane Vol: 68.6 ml 28.70 ml/m  AORTIC VALVE AV Area (Vmax):    1.84 cm AV Area (Vmean):   1.82 cm AV Area (VTI):     1.79 cm AV Vmax:           170.50 cm/s AV Vmean:          115.733 cm/s AV VTI:            0.272 m AV Peak Grad:      11.6 mmHg AV Mean Grad:      7.7 mmHg LVOT Vmax:         82.70 cm/s LVOT Vmean:        55.300 cm/s LVOT VTI:          0.128  m LVOT/AV VTI ratio: 0.47  AORTA Ao Root diam: 3.00 cm Ao Asc diam:  3.70 cm MITRAL VALVE MV Area (PHT): 3.36 cm    SHUNTS MV Decel Time: 226 msec    Systemic VTI:  0.13 m MV E velocity: 80.60 cm/s  Systemic Diam: 2.20 cm MV A velocity: 32.60 cm/s MV E/A ratio:  2.47 Kardie Tobb DO Electronically signed by Dub Huntsman DO Signature Date/Time: 12/30/2023/3:55:33 PM    Final     Family Communication: Discussed with patient, understand and agree. All questions answered.  Disposition: Status is: Inpatient Remains inpatient appropriate because: IV antibiotics for pneumonia, PT/ OT  Planned Discharge Destination: Home with Home Health     Time spent: 42 minutes  Author: Concepcion Riser, MD 12/31/2023 5:06 PM Secure chat 7am to 7pm For on  call review [email or web address].

## 2023-12-31 NOTE — Plan of Care (Signed)

## 2024-01-01 DIAGNOSIS — I4891 Unspecified atrial fibrillation: Secondary | ICD-10-CM | POA: Diagnosis not present

## 2024-01-01 DIAGNOSIS — I5032 Chronic diastolic (congestive) heart failure: Secondary | ICD-10-CM | POA: Diagnosis not present

## 2024-01-01 DIAGNOSIS — J189 Pneumonia, unspecified organism: Secondary | ICD-10-CM | POA: Diagnosis not present

## 2024-01-01 DIAGNOSIS — N1832 Chronic kidney disease, stage 3b: Secondary | ICD-10-CM | POA: Diagnosis not present

## 2024-01-01 LAB — CBC
HCT: 37.5 % — ABNORMAL LOW (ref 39.0–52.0)
Hemoglobin: 11 g/dL — ABNORMAL LOW (ref 13.0–17.0)
MCH: 22.3 pg — ABNORMAL LOW (ref 26.0–34.0)
MCHC: 29.3 g/dL — ABNORMAL LOW (ref 30.0–36.0)
MCV: 76.1 fL — ABNORMAL LOW (ref 80.0–100.0)
Platelets: 345 K/uL (ref 150–400)
RBC: 4.93 MIL/uL (ref 4.22–5.81)
RDW: 17.6 % — ABNORMAL HIGH (ref 11.5–15.5)
WBC: 19.5 K/uL — ABNORMAL HIGH (ref 4.0–10.5)
nRBC: 0 % (ref 0.0–0.2)

## 2024-01-01 LAB — GLUCOSE, CAPILLARY
Glucose-Capillary: 170 mg/dL — ABNORMAL HIGH (ref 70–99)
Glucose-Capillary: 151 mg/dL — ABNORMAL HIGH (ref 70–99)
Glucose-Capillary: 148 mg/dL — ABNORMAL HIGH (ref 70–99)
Glucose-Capillary: 169 mg/dL — ABNORMAL HIGH (ref 70–99)
Glucose-Capillary: 171 mg/dL — ABNORMAL HIGH (ref 70–99)

## 2024-01-01 LAB — BASIC METABOLIC PANEL WITH GFR
Anion gap: 11 (ref 5–15)
BUN: 22 mg/dL (ref 8–23)
CO2: 24 mmol/L (ref 22–32)
Calcium: 8.9 mg/dL (ref 8.9–10.3)
Chloride: 106 mmol/L (ref 98–111)
Creatinine, Ser: 1.91 mg/dL — ABNORMAL HIGH (ref 0.61–1.24)
GFR, Estimated: 36 mL/min — ABNORMAL LOW (ref >60)
Glucose, Bld: 158 mg/dL — ABNORMAL HIGH (ref 70–99)
Potassium: 3.9 mmol/L (ref 3.5–5.1)
Sodium: 141 mmol/L (ref 135–145)

## 2024-01-01 MED ORDER — ONDANSETRON HCL 4 MG/2ML IJ SOLN
4.0000 mg | Freq: Three times a day (TID) | INTRAMUSCULAR | Status: DC | PRN
  Administered 2024-01-01: 4 mg via INTRAVENOUS
  Filled 2024-01-01: qty 2

## 2024-01-01 NOTE — Evaluation (Signed)
 Occupational Therapy Evaluation Patient Details Name: Mike Hunter MRN: [id number] DOB: 05/21/1951 Today's Date: 01/01/2024   History of Present Illness   Pt is 73 yo presenting [facility] on 7/15 from [facility] ED due to L sided chest pain that is worse with coughing and associated palpitations. Chest x-ray with findings of L sided infiltrate vs. Atelectasis. PMH: Paroxysmal a-fib on xarelto , chornic HfpEF, COPD, CKD III, DM II, HTN, osteoporosis, CAD s/p PCI with stent placement, OSA on CPAP.     Clinical Impressions At baseline, pt is Ind to Mod I with ADLs, uses a walking stick for functional mobility, and drives. Pt now presents with decreased activity tolerance, impaired cardiopulmonary status, generalized B UE weakness, decreased balance, and decreased safety and independence with functional tasks. Pt currently demonstrating ability to complete ADLs largely with Set up to Max assist and functional transfers with a RW with Min assist. Pt participated well in session but limited by pt reported dizziness in sitting and standing with episode of diastolic hypertension in standing (see General Comments below for more details). VSS otherwise stable on 2L continuous O2 through nasal cannula. Pt will benefit from acute skilled OT services to address deficits outlined below and increase safety and independence with functional tasks. OT recommends post acute intensive inpatient skilled rehab services < 3 hours per day; however, pt is declining intensive inpatient rehab at this time. Due to this, pt will benefit from [facility] OT.      If plan is discharge home, recommend the following:   A lot of help with bathing/dressing/bathroom;A little help with walking and/or transfers;Assistance with cooking/housework;Assist for transportation;Help with stairs or ramp for entrance     Functional Status Assessment   Patient has had a recent decline in their functional status and demonstrates the ability to make  significant improvements in function in a reasonable and predictable amount of time.     Equipment Recommendations   BSC/3in1 (Pt requires bariatric BSC/3in1)     Recommendations for Other Services         Precautions/Restrictions   Precautions Precautions: Fall;Other (comment) Recall of Precautions/Restrictions: Intact Precaution/Restrictions Comments: watch BP and O2 Restrictions Weight Bearing Restrictions Per Provider Order: No     Mobility Bed Mobility Overal bed mobility: Needs Assistance Bed Mobility: Sidelying to Sit, Sit to Supine   Sidelying to sit: Min assist, HOB elevated, Used rails (with increased time)   Sit to supine: Contact guard assist, HOB elevated, Used rails   General bed mobility comments: Assist to elevate trunk into sitting    Transfers Overall transfer level: Needs assistance Equipment used: Rolling walker (2 wheels) Transfers: Sit to/from Stand Sit to Stand: Min assist           General transfer comment: Min assist to power up; pt able to side step x4 at EOB toward HOB (pt's Right) with RW with Min assist      Balance Overall balance assessment: Needs assistance, History of Falls Sitting-balance support: Single extremity supported, No upper extremity supported, Feet supported Sitting balance-Leahy Scale: Fair     Standing balance support: Bilateral upper extremity supported, During functional activity, Reliant on assistive device for balance Standing balance-Leahy Scale: Poor Standing balance comment: heavy reliance of B UE support of RW and CGA from therapist to maintain static and dynamic standing                           ADL either performed or assessed with  clinical judgement   ADL Overall ADL's : Needs assistance/impaired Eating/Feeding: Set up;Sitting   Grooming: Set up;Supervision/safety;Sitting   Upper Body Bathing: Set up;Supervision/ safety;Sitting   Lower Body Bathing: Moderate assistance;Maximal  assistance;Sitting/lateral leans;Sit to/from stand   Upper Body Dressing : Contact guard assist;Sitting   Lower Body Dressing: Sitting/lateral leans;Minimal assistance;Maximal assistance;Sit to/from stand Lower Body Dressing Details (indicate cue type and reason): Min assist in sitting/lateral leans; Max assist in standing Toilet Transfer: Minimal assistance;BSC/3in1;Rolling walker (2 wheels) (step-pivot) Toilet Transfer Details (indicate cue type and reason): simulated at EOB Toileting- Clothing Manipulation and Hygiene: Moderate assistance;Sit to/from stand;Maximal assistance       Functional mobility during ADLs:  (deferred this session for pt/therapist safety due to pt with dizziness and with diastolic hypertension in standing) General ADL Comments: Pt with decreased activity tolerance, fatiguing quickly during tasks. Pt also limited by reproted dizziness in sitting and standing and diastolic hypertension in standing     Vision Baseline Vision/History: 1 Wears glasses Ability to See in Adequate Light: 0 Adequate (with glasses) Patient Visual Report: No change from baseline Vision Assessment?: No apparent visual deficits Additional Comments: Vision [facility] for tasks assessed; not formally screened     Perception         Praxis         Pertinent Vitals/Pain Pain Assessment Pain Assessment: Faces Faces Pain Scale: Hurts little more Pain Location: back, worse with movement Pain Descriptors / Indicators: Aching, Discomfort, Grimacing, Sore Pain Intervention(s): Limited activity within patient's tolerance, Monitored during session, Repositioned     Extremity/Trunk Assessment Upper Extremity Assessment Upper Extremity Assessment: Right hand dominant;Generalized weakness;RUE deficits/detail;LUE deficits/detail RUE Deficits / Details: generalized weakness; mildly decreased shoulder ROM and pt reported stiffness in shoulder with AROM with pt also reporting this is baseline since  sustaining cx compression fxs in January 2025 LUE Deficits / Details: generalized weakness; mildly decreased shoulder ROM and pt reported stiffness in shoulder with AROM with pt also reporting this is baseline since sustaining cx compression fxs in January 2025   Lower Extremity Assessment Lower Extremity Assessment: Defer to PT evaluation   Cervical / Trunk Assessment Cervical / Trunk Assessment: Other exceptions Cervical / Trunk Exceptions: increased body habitus; hx of cx compression fx and cervical fusion   Communication Communication Communication: No apparent difficulties   Cognition Arousal: Alert Behavior During Therapy: WFL for tasks assessed/performed Cognition: No apparent impairments             OT - Cognition Comments: Pt AAOx4 and pleasant throughout session. Pt cognition WFL for tasks assessed. Cognition not fomrally screened or evaluated.                 Following commands: Intact       Cueing  General Comments   Cueing Techniques: Verbal cues  Hr in the mid-70s to low-90s, O2 sat >/94% on 2L continuous O2 through nasal cannula. Pt reporting dizziness in sitting and standing and reporting dizziness earlier today when attempting standing with nursing staff. Pt BP in supine 122/68 (82), BP in sitting 134/67 (87), BP in standing 127/109 (116), and after returning to supine BP 119/53 (72). Pt unable to tolerate standing to 3 min for second standing BP. RN and MD each present during a portion of session.   Exercises     Shoulder Instructions      Home Living Family/patient expects to be discharged to:: Private residence Living Arrangements: Spouse/significant other Available Help at Discharge: Family;Available 24 hours/day Type of Home: House  Home Access: Stairs to enter Entrance Stairs-Number of Steps: 3 Entrance Stairs-Rails: Right Home Layout: One level     Bathroom Shower/Tub: Producer, television/film/video: Handicapped height Bathroom  Accessibility: Yes   Home Equipment: Agricultural consultant (2 wheels);Shower seat;Grab bars - tub/shower;Grab bars - toilet;Other (comment) (walking stick; CPAP, O2 equipment, including concentrator)   Additional Comments: wears 2-5 L O2 at night , CPAP at night but if gets short of breath during day will put it on.      Prior Functioning/Environment Prior Level of Function : Independent/Modified Independent;Driving;History of Falls (last six months)             Mobility Comments: At baseline, pt is Mod I for funcitonalmobility with a walking stick. Pt also reports use of a RW fo a little while after sustaining cx compression fxs in January 2025. Pt reports at least two falls in last 6 months, including fall leading to cx compression fxs. ADLs Comments: Pt is largely Ind to Mod I for ADLs, light meal prep, and manages his medication. Pt drives.    OT Problem List: Decreased strength;Decreased activity tolerance;Impaired balance (sitting and/or standing);Cardiopulmonary status limiting activity   OT Treatment/Interventions: Self-care/ADL training;Therapeutic exercise;Energy conservation;DME and/or AE instruction;Therapeutic activities;Patient/family education;Balance training      OT Goals(Current goals can be found in the care plan section)   Acute Rehab OT Goals Patient Stated Goal: to return home OT Goal Formulation: With patient Time For Goal Achievement: 01/15/24 Potential to Achieve Goals: Good ADL Goals Pt Will Perform Lower Body Bathing: with contact guard assist;sitting/lateral leans;sit to/from stand (with adaptive equipment as needed) Pt Will Perform Lower Body Dressing: with contact guard assist;sitting/lateral leans;sit to/from stand (with adaptive equipment as needed) Pt Will Transfer to Toilet: with supervision;ambulating;bedside commode (with least restrictive AD) Pt Will Perform Toileting - Clothing Manipulation and hygiene: with contact guard assist;sitting/lateral  leans;sit to/from stand Pt/caregiver will Perform Home Exercise Program: Increased strength;Both right and left upper extremity;With theraband;Independently;With written HEP provided Additional ADL Goal #1: Patient will demonstrate ability to Independently state 3 energy conservation strategies to increase safety and independence with funcitonal tasks.   OT Frequency:  Min 2X/week    Co-evaluation              AM-PAC OT 6 Clicks Daily Activity     Outcome Measure Help from another person eating meals?: A Little Help from another person taking care of personal grooming?: A Little Help from another person toileting, which includes using toliet, bedpan, or urinal?: A Lot Help from another person bathing (including washing, rinsing, drying)?: A Lot Help from another person to put on and taking off regular upper body clothing?: A Little Help from another person to put on and taking off regular lower body clothing?: A Lot 6 Click Score: 15   End of Session Equipment Utilized During Treatment: Gait belt;Rolling walker (2 wheels);Oxygen Nurse Communication: Mobility status;Other (comment) (vital signs)  Activity Tolerance: Patient tolerated treatment well;Patient limited by fatigue;Treatment limited secondary to medical complications (Comment) (limited by pt reported dizziness and episode of diastolic hypertension in standing) Patient left: in bed;with call bell/phone within reach;with bed alarm set  OT Visit Diagnosis: Unsteadiness on feet (R26.81);Other abnormalities of gait and mobility (R26.89);Muscle weakness (generalized) (M62.81);Other (comment) (decreased activity tolerance)                Time: 9056-8964 OT Time Calculation (min): 52 min Charges:  OT General Charges $OT Visit: 1 Visit OT Evaluation $OT Eval Moderate  Complexity: 1 Mod OT Treatments $Self Care/Home Management : 23-37 mins  Margarie Rockey HERO., OTR/L, MA Acute Rehab [phone]   Margarie FORBES Horns 01/01/2024,  11:58 AM

## 2024-01-01 NOTE — Plan of Care (Signed)
  Problem: Health Behavior/Discharge Planning: Goal: Ability to manage health-related needs will improve Outcome: Progressing   Problem: Clinical Measurements: Goal: Respiratory complications will improve Outcome: Progressing Goal: Cardiovascular complication will be avoided Outcome: Progressing   Problem: Activity: Goal: Risk for activity intolerance will decrease Outcome: Progressing   Problem: Pain Managment: Goal: General experience of comfort will improve and/or be controlled Outcome: Progressing   Problem: Safety: Goal: Ability to remain free from injury will improve Outcome: Progressing   Problem: Metabolic: Goal: Ability to maintain appropriate glucose levels will improve Outcome: Progressing

## 2024-01-01 NOTE — Progress Notes (Signed)
 Progress Note   Patient: Mike Hunter FMW:[id number] DOB: Aug 26, 1950 DOA: 12/29/2023     3 DOS: the patient was seen and examined on 01/01/2024   Brief hospital course: Mike Hunter is a 73 y.o. male with medical history significant for paroxysmal A-fib on Xarelto , chronic HFpEF, COPD, CKD 3B, type 2 diabetes, hypertension, osteoporosis on teriparatide , coronary artery disease status post PCI with stent placement, obesity, OSA on CPAP, who initially presented at [facility] ED due to left-sided chest pain, worse with coughing, and associated with palpitations.  Chest x-ray with left-sided infiltrate versus atelectasis.  Patient is admitted to the hospitalist service with the impression of left lower lobe pneumonia, paroxysmal atrial fibrillation with RVR, acute on chronic HFpEF.  Cardiology consulted.  Assessment and Plan: Community-acquired pneumonia- Patient will be continued on Rocephin , doxycycline  therapy. Sputum cultures not collected. Blood cultures so far negative. Off supplemental oxygen today, WBC elevated to 19. He remains afebrile, but weak. Monitor daily WBC. Continue bronchodilators.  Continue inhalers, Singulair  for COPD.  Paroxysmal A-fib: Heart rate improved. Cardiology advised no further work up. Continue metoprolol  and Xarelto .  Coronary artery disease status post stent DES x 2 to mid LAD. His chest pain is atypical.  Continue Plavix , statin therapy.  Chronic HFpEF: Patient got 1 dose of IV Lasix  in ED. He is euvolemic.  Continue home dose torsemide , beta-blocker, Aldactone .  Hypokalemia: Likely from IV diuresis he received in ED. Oral potassium supplements ordered. Continue to monitor daily potassium.  Type 2 diabetes mellitus: Accu-Cheks, sliding scale insulin .  Semglee  40 units ordered.  CKD stage IIIb: Creatinine baseline 1.5-1.8. Monitor daily renal function, electrolytes. Avoid nephrotoxic risk.  Obesity class II BMI 37.48 Diet, exercise and weight  reduction advised.  OSA on CPAP.      Out of bed to chair. Incentive spirometry. Nursing supportive care. Fall, aspiration precautions. Diet:  Diet Orders (From admission, onward)     Start     Ordered   12/29/23 2119  Diet heart healthy/carb modified Room service appropriate? Yes with Assist; Fluid consistency: Thin  Diet effective now       Question Answer Comment  Diet-HS Snack? Nothing   Room service appropriate? Yes with Assist   Fluid consistency: Thin      12/29/23 2118           DVT prophylaxis:  rivaroxaban  (XARELTO ) tablet 20 mg  Level of care: Telemetry Cardiac   Code Status: Full Code  Subjective: Patient is seen and examined today morning.  He feels weak, unable to work with PT. Eating poor. He is off supplemental oxygen. Has constipation. States he does not feel good today.  Physical Exam: Vitals:   01/01/24 0648 01/01/24 1000 01/01/24 1050 01/01/24 1236  BP:  (!) 149/52  (!) 139/59  Pulse: 78 85    Resp:  16  16  Temp:    99.9 F (37.7 C)  TempSrc:    Axillary  SpO2: 95% 96% 96%   Weight:      Height:        General - Elderly obese Caucasian male, distress due to weakness. HEENT - PERRLA, EOMI, atraumatic head, non tender sinuses. Lung - Clear, diffuse rales, rhonchi on left.  No wheezes. Heart - S1, S2 heard, no murmurs, rubs, trace pedal edema. Abdomen - Soft, non tender obese, bowel sounds good Neuro - Alert, awake and oriented x 3, non focal exam. Skin - Warm and dry.  Data Reviewed:      Latest  Ref Rng & Units 01/01/2024    3:43 AM 12/31/2023    4:50 AM 12/30/2023    4:00 AM  CBC  WBC 4.0 - 10.5 K/uL 19.5  12.8  9.4   Hemoglobin 13.0 - 17.0 g/dL 88.9  9.8  9.8   Hematocrit 39.0 - 52.0 % 37.5  33.8  33.5   Platelets 150 - 400 K/uL 345  320  325       Latest Ref Rng & Units 01/01/2024    3:43 AM 12/31/2023    4:50 AM 12/30/2023    4:00 AM  BMP  Glucose 70 - 99 mg/dL 841  822  829   BUN 8 - 23 mg/dL 22  28  28    Creatinine 0.61  - 1.24 mg/dL 8.08  8.23  8.14   Sodium 135 - 145 mmol/L 141  140  141   Potassium 3.5 - 5.1 mmol/L 3.9  4.1  3.2   Chloride 98 - 111 mmol/L 106  106  105   CO2 22 - 32 mmol/L 24  23  25    Calcium  8.9 - 10.3 mg/dL 8.9  8.9  8.4    No results found.   Family Communication: Discussed with patient, understand and agree. All questions answered.  Disposition: Status is: Inpatient Remains inpatient appropriate because: IV antibiotics for pneumonia, PT/ OT  Planned Discharge Destination: Home with Home Health     Time spent: 42 minutes  Author: Concepcion Riser, MD 01/01/2024 4:30 PM Secure chat 7am to 7pm For on call review [email or web address].

## 2024-01-01 NOTE — Progress Notes (Addendum)
 Speech Language Pathology Treatment: Dysphagia  Patient Details Name: Mike Hunter MRN: [id number] DOB: Oct 14, 1950 Today's Date: 01/01/2024 Time: 8954-8944 SLP Time Calculation (min) (ACUTE ONLY): 10 min  Assessment / Plan / Recommendation Clinical Impression  Pt seen at bedside for skilled ST intervention targeting goals for safe swallow. Pt awake and alert, no family present. Pt observed drinking water via straw while seated upright in bed. No overt s/s aspiration observed. Pt reports pills continue to get stuck. SLP reviewed safe swallow precautions (posted at [facility]), including upright position during/after meals, small bites/sips, slow rate. If esophageal issues are present, it is possible for esophageal stasis/backflow to spill into the open airway. Pt was pleasant, but did not seem receptive to education, stating he sometimes sits up and eats slowly, but he is hard headed and is used to eating quickly, taking large bites/sips. He states his family encourages him to slow down and chew solids more thoroughly.  Recommendations from instrumental swallow evaluation last year included small pills whole in puree, large pills crushed in puree. Pt indicated he did not continue to follow this recommendation at home, and returned to taking multiple pills all at once.  Primary esophageal dysphagia continues to be suspected, with apparently limited carryover of education to maximize swallow safety and facilitate esophageal clearing. Recommend consideration of esophagram to evaluate motility and determine appropriate course of action. SLP will follow up to continue patient education once results are available. MD/RN informed of recommendations.    HPI HPI: 73yo male admitted 12/29/23 with palpitations, cough, left side chest pain. PMH: PAFib, chronic HFpEF, COPD, CKD3b, DM2, HTN, osteoporosis, CAD s/p PCI with stent placement, obesity, OSA/CPAP. CXR - LLL patchy opacities.      SLP Plan  Continue with  current plan of care          Recommendations  Diet recommendations: Regular;Thin liquid Liquids provided via: Cup;Straw Medication Administration: Other (Comment) (small pills whole in puree, crush large pills in puree.) Supervision: Patient able to self feed Compensations: Minimize environmental distractions;Slow rate;Small sips/bites Postural Changes and/or Swallow Maneuvers: Seated upright 90 degrees;Upright 30-60 min after meal        Oral care BID     Dysphagia, unspecified (R13.10)     Continue with current plan of care    Amarylis Rovito B. Dory, MSP, CCC-SLP Speech Language Pathologist Office: [phone]  Dory Caprice Daring 01/01/2024, 10:59 AM

## 2024-01-01 NOTE — Evaluation (Signed)
 Physical Therapy Evaluation Patient Details Name: Mike Hunter MRN: [id number] DOB: 1950/09/08 Today's Date: 01/01/2024  History of Present Illness  Pt is 73 yo presenting to [facility] on 7/15 from [facility] due to L sided chest pain that is worse with coughing and associated palpitations. Chest x-ray with findings of L sided infiltrate vs. Atelectasis. PMH: Paroxysmal a-fib on xarelto , chornic HfpEF, COPD, CKD III, DM II, HTN, osteoporosis, CAD s/p PCI with stent placement, OSA on CPAP.  Clinical Impression  Pt is presenting below baseline level of functioning. Currently pt requires Min A for bed mobility, sit to stand and transfers. Pt was unable to progress gait due to dizziness/weakness. Pt has supportive family but spouse is unable to provide much physical assist and currently pt requires Min A for functional mobility. Due to pt current functional status, home set up and available assistance at home recommending skilled physical therapy services < 3 hours/day in order to address strength, balance and functional mobility to decrease risk for falls, injury, immobility, skin break down and re-hospitalization.          If plan is discharge home, recommend the following: A little help with walking and/or transfers;Assistance with cooking/housework;Help with stairs or ramp for entrance;Assist for transportation   Can travel by private vehicle   Yes    Equipment Recommendations None recommended by PT     Functional Status Assessment Patient has had a recent decline in their functional status and demonstrates the ability to make significant improvements in function in a reasonable and predictable amount of time.     Precautions / Restrictions Precautions Precautions: Fall;Other (comment) Recall of Precautions/Restrictions: Intact Precaution/Restrictions Comments: watch BP and O2 Restrictions Weight Bearing Restrictions Per Provider Order: No      Mobility  Bed Mobility Overal bed  mobility: Needs Assistance Bed Mobility: Supine to Sit     Supine to sit: Min assist     General bed mobility comments: increased time use of bed rails, Min A at bil LE    Transfers Overall transfer level: Needs assistance Equipment used: Rolling walker (2 wheels) Transfers: Sit to/from Stand, Bed to chair/wheelchair/BSC Sit to Stand: Min assist   Step pivot transfers: Min assist       General transfer comment: Min assist for initial momentum to get to standing. Pt able to take steps from EOB to recliner with Min A for balance    Ambulation/Gait     Pre-gait activities: Pt was able to take steps from EOB to recliner at Min A for balance and assist with navigating AD.      Balance Overall balance assessment: Needs assistance, History of Falls Sitting-balance support: Single extremity supported, No upper extremity supported, Feet supported Sitting balance-Leahy Scale: Fair     Standing balance support: Bilateral upper extremity supported, During functional activity, Reliant on assistive device for balance Standing balance-Leahy Scale: Poor Standing balance comment: heavy reliance of B UE support of RW and CGA from therapist to maintain static and dynamic standing       Pertinent Vitals/Pain Pain Assessment Pain Assessment: Faces Faces Pain Scale: Hurts little more Pain Location: chest pain Pain Descriptors / Indicators: Aching Pain Intervention(s): Monitored during session, Limited activity within patient's tolerance    Home Living Family/patient expects to be discharged to:: Private residence Living Arrangements: Spouse/significant other Available Help at Discharge: Family;Available 24 hours/day Type of Home: House Home Access: Stairs to enter Entrance Stairs-Rails: Right Entrance Stairs-Number of Steps: 3   Home Layout: One level  Home Equipment: Agricultural consultant (2 wheels);Shower seat;Grab bars - tub/shower;Grab bars - toilet;Other (comment) (walking stick;  CPAP, O2 equipment, including concentrator) Additional Comments: wears 2-5 L O2 at night , CPAP at night but if gets short of breath during day will put it on.    Prior Function Prior Level of Function : Independent/Modified Independent;Driving;History of Falls (last six months)             Mobility Comments: At baseline, pt is Mod I for functional mobility with a walking stick. Pt also reports use of a RW fo a little while after sustaining compression fxs in January 2025. Pt reports at least two falls in last 6 months, including fall leading to compression fxs. ADLs Comments: Pt is largely Ind to Mod I for ADLs, light meal prep, and manages his medication. Pt drives.     Extremity/Trunk Assessment   Upper Extremity Assessment Upper Extremity Assessment: Defer to OT evaluation    Lower Extremity Assessment Lower Extremity Assessment: Generalized weakness    Cervical / Trunk Assessment Cervical / Trunk Assessment: Other exceptions;Kyphotic Cervical / Trunk Exceptions: hx of cx compression fx and cervical fusion  Communication   Communication Communication: Impaired Factors Affecting Communication: Hearing impaired    Cognition Arousal: Alert Behavior During Therapy: WFL for tasks assessed/performed   PT - Cognitive impairments: No apparent impairments       Following commands: Intact       Cueing Cueing Techniques: Verbal cues     General Comments General comments (skin integrity, edema, etc.): Pt reports dizziness in sitting/standing that remains the same. No increase/decrease.        Assessment/Plan    PT Assessment Patient needs continued PT services  PT Problem List Decreased strength;Decreased activity tolerance;Decreased balance;Decreased mobility;Decreased safety awareness       PT Treatment Interventions DME instruction;Balance training;Gait training;Stair training;Neuromuscular re-education;Functional mobility training;Therapeutic  activities;Therapeutic exercise;Patient/family education    PT Goals (Current goals can be found in the Care Plan section)  Acute Rehab PT Goals Patient Stated Goal: To return home PT Goal Formulation: With patient/family Time For Goal Achievement: 01/15/24 Potential to Achieve Goals: Fair    Frequency Min 2X/week        AM-PAC PT 6 Clicks Mobility  Outcome Measure Help needed turning from your back to your side while in a flat bed without using bedrails?: A Little Help needed moving from lying on your back to sitting on the side of a flat bed without using bedrails?: A Little Help needed moving to and from a bed to a chair (including a wheelchair)?: A Little Help needed standing up from a chair using your arms (e.g., wheelchair or bedside chair)?: A Little Help needed to walk in hospital room?: A Lot Help needed climbing 3-5 steps with a railing? : Total 6 Click Score: 15    End of Session Equipment Utilized During Treatment: Gait belt Activity Tolerance: Patient tolerated treatment well Patient left: in chair;with call bell/phone within reach;with family/visitor present Nurse Communication: Mobility status PT Visit Diagnosis: Unsteadiness on feet (R26.81);Other abnormalities of gait and mobility (R26.89);Muscle weakness (generalized) (M62.81);Dizziness and giddiness (R42)    Time: 8593-8562 PT Time Calculation (min) (ACUTE ONLY): 31 min   Charges:   PT Evaluation $PT Eval Low Complexity: 1 Low PT Treatments $Therapeutic Activity: 8-22 mins PT General Charges $$ ACUTE PT VISIT: 1 Visit       Dorothyann Maier, DPT, CLT  Acute Rehabilitation Services Office: [phone] (Secure chat preferred)   Dorothyann DEL  Shadaya Marschner 01/01/2024, 3:25 PM

## 2024-01-02 DIAGNOSIS — I5032 Chronic diastolic (congestive) heart failure: Secondary | ICD-10-CM | POA: Diagnosis not present

## 2024-01-02 DIAGNOSIS — J189 Pneumonia, unspecified organism: Secondary | ICD-10-CM | POA: Diagnosis not present

## 2024-01-02 DIAGNOSIS — I4891 Unspecified atrial fibrillation: Secondary | ICD-10-CM | POA: Diagnosis not present

## 2024-01-02 DIAGNOSIS — N1832 Chronic kidney disease, stage 3b: Secondary | ICD-10-CM | POA: Diagnosis not present

## 2024-01-02 LAB — CBC
HCT: 32.5 % — ABNORMAL LOW (ref 39.0–52.0)
Hemoglobin: 9.5 g/dL — ABNORMAL LOW (ref 13.0–17.0)
MCH: 22.1 pg — ABNORMAL LOW (ref 26.0–34.0)
MCHC: 29.2 g/dL — ABNORMAL LOW (ref 30.0–36.0)
MCV: 75.6 fL — ABNORMAL LOW (ref 80.0–100.0)
Platelets: 308 K/uL (ref 150–400)
RBC: 4.3 MIL/uL (ref 4.22–5.81)
RDW: 17.6 % — ABNORMAL HIGH (ref 11.5–15.5)
WBC: 16.7 K/uL — ABNORMAL HIGH (ref 4.0–10.5)
nRBC: 0 % (ref 0.0–0.2)

## 2024-01-02 LAB — URINALYSIS, ROUTINE W REFLEX MICROSCOPIC
Bacteria, UA: NONE SEEN
Bilirubin Urine: NEGATIVE
Glucose, UA: >=500 mg/dL — AB
Hgb urine dipstick: NEGATIVE
Ketones, ur: NEGATIVE mg/dL
Leukocytes,Ua: NEGATIVE
Nitrite: NEGATIVE
Protein, ur: NEGATIVE mg/dL
Specific Gravity, Urine: 1.008 (ref 1.005–1.030)
pH: 5 (ref 5.0–8.0)

## 2024-01-02 LAB — GLUCOSE, CAPILLARY
Glucose-Capillary: 126 mg/dL — ABNORMAL HIGH (ref 70–99)
Glucose-Capillary: 187 mg/dL — ABNORMAL HIGH (ref 70–99)
Glucose-Capillary: 201 mg/dL — ABNORMAL HIGH (ref 70–99)
Glucose-Capillary: 229 mg/dL — ABNORMAL HIGH (ref 70–99)
Glucose-Capillary: 224 mg/dL — ABNORMAL HIGH (ref 70–99)

## 2024-01-02 LAB — BASIC METABOLIC PANEL WITH GFR
Anion gap: 8 (ref 5–15)
BUN: 21 mg/dL (ref 8–23)
CO2: 24 mmol/L (ref 22–32)
Calcium: 8.6 mg/dL — ABNORMAL LOW (ref 8.9–10.3)
Chloride: 104 mmol/L (ref 98–111)
Creatinine, Ser: 1.32 mg/dL — ABNORMAL HIGH (ref 0.61–1.24)
GFR, Estimated: 57 mL/min — ABNORMAL LOW (ref >60)
Glucose, Bld: 149 mg/dL — ABNORMAL HIGH (ref 70–99)
Potassium: 3.7 mmol/L (ref 3.5–5.1)
Sodium: 136 mmol/L (ref 135–145)

## 2024-01-02 NOTE — Plan of Care (Signed)
   Problem: Education: Goal: Knowledge of General Education information will improve Description Including pain rating scale, medication(s)/side effects and non-pharmacologic comfort measures Outcome: Progressing

## 2024-01-02 NOTE — Progress Notes (Signed)
 Please be advised that the above-named patient will require a short-term nursing home stay-anticipated 30 days or less for rehabilitation and strengthening. The plan is for return home.

## 2024-01-02 NOTE — Progress Notes (Signed)
 Patients temp 101.7 taken by Christopher, NT.  Dr Darci notified.  Tylenol  650mg  po given.  Orders for blood cultures and UA received.

## 2024-01-02 NOTE — Progress Notes (Signed)
 Progress Note   Patient: Mike Hunter FMW:[id number] DOB: [birth year] DOA: 12/29/2023     4 DOS: the patient was seen and examined on 01/02/2024   Brief hospital course: Mike Hunter is a 73 y.o. male with medical history significant for paroxysmal A-fib on Xarelto , chronic HFpEF, COPD, CKD 3B, type 2 diabetes, hypertension, osteoporosis on teriparatide , coronary artery disease status post PCI with stent placement, obesity, OSA on CPAP, who initially presented at [facility] ED due to left-sided chest pain, worse with coughing, and associated with palpitations.  Chest x-ray with left-sided infiltrate versus atelectasis.  Patient is admitted to the hospitalist service with the impression of left lower lobe pneumonia, paroxysmal atrial fibrillation with RVR, acute on chronic HFpEF.  Cardiology consulted.  Assessment and Plan: Community-acquired pneumonia- Patient will be continued on Rocephin , doxycycline  therapy. Sputum cultures not collected. Blood cultures so far negative. Off supplemental oxygen today, WBC elevated to 19. He remains afebrile, but weak. Monitor daily WBC. Continue bronchodilators.  Continue inhalers, Singulair  for COPD. Dysphagia - SLP advised esophagram which is ordered. PT/ OT advised SNF STR, TOC working on dc needs.  Paroxysmal A-fib: Heart rate improved. Cardiology advised no further work up. Continue metoprolol  and Xarelto .  Coronary artery disease status post stent DES x 2 to mid LAD. His chest pain is atypical.  Continue Plavix , statin therapy.  Chronic HFpEF: Patient got 1 dose of IV Lasix  in ED. He is euvolemic.  Continue home dose torsemide , beta-blocker, Aldactone .  Hypokalemia: Likely from IV diuresis he received in ED. Oral potassium supplements ordered. Continue to monitor daily potassium.  Type 2 diabetes mellitus: Accu-Cheks, sliding scale insulin .  Semglee  40 units ordered.  CKD stage IIIb: Creatinine baseline 1.5-1.8. Monitor daily renal  function, electrolytes. Avoid nephrotoxic risk.  Obesity class II BMI 37.48 Diet, exercise and weight reduction advised.  OSA on CPAP.      Out of bed to chair. Incentive spirometry. Nursing supportive care. Fall, aspiration precautions. Diet:  Diet Orders (From admission, onward)     Start     Ordered   12/29/23 2119  Diet heart healthy/carb modified Room service appropriate? Yes with Assist; Fluid consistency: Thin  Diet effective now       Question Answer Comment  Diet-HS Snack? Nothing   Room service appropriate? Yes with Assist   Fluid consistency: Thin      12/29/23 2118           DVT prophylaxis:  rivaroxaban  (XARELTO ) tablet 20 mg  Level of care: Telemetry Cardiac   Code Status: Full Code  Subjective: Patient is seen and examined today morning.  He feels weak, has constipation. States he is better than yesterday. Advised to work with PT.  Physical Exam: Vitals:   01/02/24 0636 01/02/24 0736 01/02/24 0922 01/02/24 1057  BP:  (!) 114/59  132/64  Pulse: 68     Resp:  15    Temp:  99.2 F (37.3 C)    TempSrc:  Oral    SpO2: 96%  94%   Weight:      Height:        General - Elderly obese Caucasian male, no apparent distress. HEENT - PERRLA, EOMI, atraumatic head, non tender sinuses. Lung - Clear, diffuse rales, rhonchi on left.  No wheezes. Heart - S1, S2 heard, no murmurs, rubs, trace pedal edema. Abdomen - Soft, non tender obese, bowel sounds good Neuro - Alert, awake and oriented x 3, non focal exam. Skin - Warm and dry.  Data Reviewed:      Latest Ref Rng & Units 01/02/2024    3:01 AM 01/01/2024    3:43 AM 12/31/2023    4:50 AM  CBC  WBC 4.0 - 10.5 K/uL 16.7  19.5  12.8   Hemoglobin 13.0 - 17.0 g/dL 9.5  88.9  9.8   Hematocrit 39.0 - 52.0 % 32.5  37.5  33.8   Platelets 150 - 400 K/uL 308  345  320       Latest Ref Rng & Units 01/02/2024    3:01 AM 01/01/2024    3:43 AM 12/31/2023    4:50 AM  BMP  Glucose 70 - 99 mg/dL 850  841  822    BUN 8 - 23 mg/dL 21  22  28    Creatinine 0.61 - 1.24 mg/dL 8.67  8.08  8.23   Sodium 135 - 145 mmol/L 136  141  140   Potassium 3.5 - 5.1 mmol/L 3.7  3.9  4.1   Chloride 98 - 111 mmol/L 104  106  106   CO2 22 - 32 mmol/L 24  24  23    Calcium  8.9 - 10.3 mg/dL 8.6  8.9  8.9    No results found.   Family Communication: Discussed with patient, understand and agree. All questions answered.  Disposition: Status is: Inpatient Remains inpatient appropriate because: IV antibiotics, esophagram, SNF placement.  Planned Discharge Destination: Home with Home Health     Time spent: 40 minutes  Author: Concepcion Riser, MD 01/02/2024 12:04 PM Secure chat 7am to 7pm For on call review [email or web address].

## 2024-01-02 NOTE — Plan of Care (Signed)
  Problem: Clinical Measurements: Goal: Will remain free from infection Outcome: Progressing Goal: Respiratory complications will improve Outcome: Progressing Goal: Cardiovascular complication will be avoided Outcome: Progressing   Problem: Activity: Goal: Risk for activity intolerance will decrease Outcome: Progressing   Problem: Nutrition: Goal: Adequate nutrition will be maintained Outcome: Progressing   Problem: Pain Managment: Goal: General experience of comfort will improve and/or be controlled Outcome: Progressing   Problem: Safety: Goal: Ability to remain free from injury will improve Outcome: Progressing

## 2024-01-02 NOTE — TOC Progression Note (Addendum)
 Transition of Care [facility]) - Progression Note    Patient Details  Name: Mike Hunter MRN: [id number] Date of Birth: 01-10-1951  [facility] CM/SW Contact  [name], [place] Phone Number: 01/02/2024, 10:17 AM  Clinical Narrative:   CSW met patient at bedside to discuss PT recs for SNF. Patient is agreeable to SNF at this time. Patient stated he has previous hx at [facility], [place] in [place], and [facility] in [place]. Patient has a walking stick/cane at bedside. Patient report RW at bedside is the hospitals.   10:39 AM PASRR pending, clinicals need to be uploaded to PASRR portal.   11:09 AM Clinicals uploaded for PASRR number. PASRR pending.   TOC will continue to follow.    Expected Discharge Plan: Skilled Nursing Facility Barriers to Discharge: Continued Medical Work up, SNF Pending bed offer, English as a second language teacher  Expected Discharge Plan and Services In-house Referral: NA Discharge Planning Services: CM Consult Post Acute Care Choice: NA Living arrangements for the past 2 months: Single Family Home                   DME Agency: NA       HH Arranged: NA           Social Determinants of Health (SDOH) Interventions SDOH Screenings   Food Insecurity: No Food Insecurity (12/30/2023)  Housing: Low Risk  (12/30/2023)  Transportation Needs: No Transportation Needs (12/30/2023)  Utilities: Not At Risk (12/30/2023)  Social Connections: Socially Integrated (12/30/2023)  Tobacco Use: Medium Risk (12/29/2023)    Readmission Risk Interventions    12/31/2023   10:58 AM  Readmission Risk Prevention Plan  Transportation Screening Complete  Medication Review (RN Care Manager) Referral to Pharmacy  HRI or Home Care Consult Complete  SW Recovery Care/Counseling Consult Complete  Palliative Care Screening Not Applicable  Skilled Nursing Facility Not Applicable

## 2024-01-02 NOTE — [place] FL2 (Signed)
 [place]  MEDICAID FL2 LEVEL OF CARE FORM     IDENTIFICATION  Patient Name: Mike Hunter Birthdate: May 24, 1951 Sex: male Admission Date (Current Location): 12/29/2023  [facility] and [place] Number:      Facility and Address:  [facility], [address], [place], [place] [address]      Provider Number: [id number]  Attending Physician Name and Address:  Darci Pore, MD  Relative Name and Phone Number:       Current Level of Care: Hospital Recommended Level of Care: Skilled Nursing Facility Prior Approval Number:    Date Approved/Denied:   PASRR Number: Pending  Discharge Plan: SNF    Current Diagnoses: Patient Active Problem List   Diagnosis Date Noted   Atrial fibrillation with rapid ventricular response (HCC) 08/24/2023   Moderate persistent asthma with exacerbation 06/07/2023   COPD with acute exacerbation (HCC) 06/07/2023   CAP (community acquired pneumonia) 06/07/2023   Sepsis due to pneumonia (HCC) 06/06/2023   Anemia due to stage 3b chronic kidney disease (HCC) 08/01/2021   Hypokalemia 09/26/2018   Hypomagnesemia 09/26/2018   Acute on chronic diastolic CHF (congestive heart failure) (HCC) 09/26/2018   CAD (coronary artery disease) 09/26/2018   Asthma 09/26/2018   Depression with anxiety 09/26/2018   Asthma with status asthmaticus    Acute kidney injury superimposed on stage 3b chronic kidney disease (HCC) 09/25/2018   Benign prostatic hyperplasia 01/17/2014   Type II or unspecified type diabetes mellitus with neurological manifestations, not stated as uncontrolled(250.60) 02/09/2013   Unspecified urinary incontinence 02/09/2013   Esophageal reflux 02/09/2013   Insulin -requiring or dependent type II diabetes mellitus (HCC) 12/30/2012   Pyogenic arthritis of knee (HCC) 12/30/2012    Orientation RESPIRATION BLADDER Height & Weight     Self, Time, Situation, Place  O2 (2L O2 [place]) Continent Weight: 267 lb 10.2 oz (121.4  kg) Height:  5' 11 (180.3 cm)  BEHAVIORAL SYMPTOMS/MOOD NEUROLOGICAL BOWEL NUTRITION STATUS      Continent Diet (see discharge summary)  AMBULATORY STATUS COMMUNICATION OF NEEDS Skin   Extensive Assist Verbally Normal                       Personal Care Assistance Level of Assistance  Bathing, Feeding, Dressing Bathing Assistance: Maximum assistance Feeding assistance: Limited assistance Dressing Assistance: Maximum assistance     Functional Limitations Info  Sight, Hearing, Speech Sight Info: Impaired (eyeglasses) Hearing Info: Impaired Speech Info: Adequate    SPECIAL CARE FACTORS FREQUENCY  PT (By licensed PT), OT (By licensed OT)     PT Frequency: 5x week OT Frequency: 5x week            Contractures Contractures Info: Not present    Additional Factors Info  Code Status, Allergies, Psychotropic, Insulin  Sliding Scale Code Status Info: Full Allergies Info: Buprenorphine Hcl, Hydromorphone , Codeine, Mirtazapine, Morphine  And Codeine Psychotropic Info: Zoloft  Insulin  Sliding Scale Info: See discharge summary       Current Medications (01/02/2024):  This is the current hospital active medication list Current Facility-Administered Medications  Medication Dose Route Frequency Provider Last Rate Last Admin   acetaminophen  (TYLENOL ) tablet 650 mg  650 mg Oral Q6H PRN Shona Terry SAILOR, DO   650 mg at 12/29/23 2313   atorvastatin  (LIPITOR) tablet 80 mg  80 mg Oral Daily Adams, Zane, PA-C   80 mg at 01/01/24 0951   budesonide  (PULMICORT ) nebulizer solution 0.5 mg  2 mL Nebulization BID Shona Terry SAILOR, DO  0.5 mg at 01/02/24 0921   cefTRIAXone  (ROCEPHIN ) 1 g in sodium chloride  0.9 % 100 mL IVPB  1 g Intravenous Q24H Shona Laurence N, DO 200 mL/hr at 01/01/24 0956 1 g at 01/01/24 0956   clopidogrel  (PLAVIX ) tablet 75 mg  75 mg Oral Daily Shona Laurence SAILOR, DO   75 mg at 01/01/24 9048   docusate sodium  (COLACE) capsule 100 mg  100 mg Oral Daily Zhou, Jenny Z, RPH   100 mg at  01/01/24 9048   doxycycline  (VIBRA -TABS) tablet 100 mg  100 mg Oral Q12H Chen, Lydia D, RPH   100 mg at 01/01/24 2118   fluticasone  furoate-vilanterol (BREO ELLIPTA ) 200-25 MCG/ACT 1 puff  1 puff Inhalation Daily Shona Laurence N, DO   1 puff at 01/02/24 9078   guaiFENesin -dextromethorphan  (ROBITUSSIN DM) 100-10 MG/5ML syrup 5 mL  5 mL Oral Q4H PRN Hall, Carole N, DO       insulin  aspart (novoLOG ) injection 0-15 Units  0-15 Units Subcutaneous TID WC Shona Laurence N, DO   2 Units at 01/02/24 9144   insulin  aspart (novoLOG ) injection 0-5 Units  0-5 Units Subcutaneous QHS Shona Laurence N, DO       insulin  glargine-yfgn (SEMGLEE ) injection 40 Units  40 Units Subcutaneous Daily Sreeram, Narendranath, MD   40 Units at 01/01/24 0951   ipratropium-albuterol  (DUONEB) 0.5-2.5 (3) MG/3ML nebulizer solution 3 mL  3 mL Nebulization Q2H PRN Shona Laurence N, DO   3 mL at 01/01/24 1228   latanoprost  (XALATAN ) 0.005 % ophthalmic solution 1 drop  1 drop Both Eyes QHS Shona Laurence N, DO   1 drop at 01/01/24 2119   melatonin tablet 5 mg  5 mg Oral QHS PRN Shona Laurence N, DO   5 mg at 12/31/23 2104   metoprolol  tartrate (LOPRESSOR ) tablet 12.5 mg  12.5 mg Oral BID Shona Laurence N, DO   12.5 mg at 01/01/24 2116   montelukast  (SINGULAIR ) tablet 10 mg  10 mg Oral QHS Shona Laurence N, DO   10 mg at 01/01/24 2118   multivitamin with minerals tablet 1 tablet  1 tablet Oral Daily Shona Laurence N, DO   1 tablet at 01/01/24 9048   ondansetron  (ZOFRAN ) injection 4 mg  4 mg Intravenous Q8H PRN Darci Pore, MD   4 mg at 01/01/24 1033   Oral care mouth rinse  15 mL Mouth Rinse PRN Shona Laurence SAILOR, DO       oxybutynin  (DITROPAN -XL) 24 hr tablet 10 mg  10 mg Oral Daily Shona Laurence N, DO   10 mg at 01/01/24 9048   pantoprazole  (PROTONIX ) EC tablet 40 mg  40 mg Oral Daily Shona Laurence SAILOR, DO   40 mg at 01/01/24 0951   polyethylene glycol (MIRALAX  / GLYCOLAX ) packet 17 g  17 g Oral Daily PRN Shona Laurence N, DO       rivaroxaban   (XARELTO ) tablet 20 mg  20 mg Oral Q supper Ripley, Carole N, DO   20 mg at 01/01/24 1656   senna-docusate (Senokot-S) tablet 2 tablet  2 tablet Oral QHS Sreeram, Narendranath, MD   2 tablet at 01/01/24 2118   sertraline  (ZOLOFT ) tablet 100 mg  100 mg Oral Daily Shona Laurence N, DO   100 mg at 01/01/24 9048   spironolactone  (ALDACTONE ) tablet 25 mg  25 mg Oral Daily Hall, Carole N, DO   25 mg at 01/01/24 0951   Teriparatide  SOPN 20 mcg  20 mcg Subcutaneous QHS Darci Pore, MD  20 mcg at 01/01/24 2136   torsemide  (DEMADEX ) tablet 20 mg  20 mg Oral Daily Sreeram, Narendranath, MD   20 mg at 01/01/24 9048     Discharge Medications: Please see discharge summary for a list of discharge medications.  Relevant Imaging Results:  Relevant Lab Results:   Additional Information SSN [id number]; may have cpap needs will need to follow up   [name], LCSWA

## 2024-01-03 DIAGNOSIS — I4891 Unspecified atrial fibrillation: Secondary | ICD-10-CM | POA: Diagnosis not present

## 2024-01-03 DIAGNOSIS — I5032 Chronic diastolic (congestive) heart failure: Secondary | ICD-10-CM | POA: Diagnosis not present

## 2024-01-03 DIAGNOSIS — J189 Pneumonia, unspecified organism: Secondary | ICD-10-CM | POA: Diagnosis not present

## 2024-01-03 DIAGNOSIS — N1832 Chronic kidney disease, stage 3b: Secondary | ICD-10-CM | POA: Diagnosis not present

## 2024-01-03 LAB — CULTURE, BLOOD (ROUTINE X 2)
Culture: NO GROWTH
Culture: NO GROWTH
Special Requests: ADEQUATE
Special Requests: ADEQUATE

## 2024-01-03 LAB — BASIC METABOLIC PANEL WITH GFR
Anion gap: 10 (ref 5–15)
BUN: 24 mg/dL — ABNORMAL HIGH (ref 8–23)
CO2: 22 mmol/L (ref 22–32)
Calcium: 8.6 mg/dL — ABNORMAL LOW (ref 8.9–10.3)
Chloride: 104 mmol/L (ref 98–111)
Creatinine, Ser: 1.39 mg/dL — ABNORMAL HIGH (ref 0.61–1.24)
GFR, Estimated: 53 mL/min — ABNORMAL LOW (ref >60)
Glucose, Bld: 266 mg/dL — ABNORMAL HIGH (ref 70–99)
Potassium: 3.5 mmol/L (ref 3.5–5.1)
Sodium: 136 mmol/L (ref 135–145)

## 2024-01-03 LAB — CBC
HCT: 31.6 % — ABNORMAL LOW (ref 39.0–52.0)
Hemoglobin: 9.4 g/dL — ABNORMAL LOW (ref 13.0–17.0)
MCH: 22.4 pg — ABNORMAL LOW (ref 26.0–34.0)
MCHC: 29.7 g/dL — ABNORMAL LOW (ref 30.0–36.0)
MCV: 75.4 fL — ABNORMAL LOW (ref 80.0–100.0)
Platelets: 309 K/uL (ref 150–400)
RBC: 4.19 MIL/uL — ABNORMAL LOW (ref 4.22–5.81)
RDW: 17.7 % — ABNORMAL HIGH (ref 11.5–15.5)
WBC: 11.9 K/uL — ABNORMAL HIGH (ref 4.0–10.5)
nRBC: 0 % (ref 0.0–0.2)

## 2024-01-03 LAB — GLUCOSE, CAPILLARY
Glucose-Capillary: 197 mg/dL — ABNORMAL HIGH (ref 70–99)
Glucose-Capillary: 254 mg/dL — ABNORMAL HIGH (ref 70–99)
Glucose-Capillary: 203 mg/dL — ABNORMAL HIGH (ref 70–99)
Glucose-Capillary: 146 mg/dL — ABNORMAL HIGH (ref 70–99)

## 2024-01-03 MED ORDER — DM-GUAIFENESIN ER 30-600 MG PO TB12
1.0000 | ORAL_TABLET | Freq: Two times a day (BID) | ORAL | Status: DC
  Administered 2024-01-03 – 2024-01-05 (×5): 1 via ORAL
  Filled 2024-01-03 (×5): qty 1

## 2024-01-03 NOTE — Progress Notes (Signed)
 Progress Note   Patient: Mike Hunter FMW:[id number] DOB: [birth year] DOA: 12/29/2023     5 DOS: the patient was seen and examined on 01/03/2024   Brief hospital course: Aqeel Norgaard Manninen is a 73 y.o. male with medical history significant for paroxysmal A-fib on Xarelto , chronic HFpEF, COPD, CKD 3B, type 2 diabetes, hypertension, osteoporosis on teriparatide , coronary artery disease status post PCI with stent placement, obesity, OSA on CPAP, who initially presented at [facility] ED due to left-sided chest pain, worse with coughing, and associated with palpitations.  Chest x-ray with left-sided infiltrate versus atelectasis.  Patient is admitted to the hospitalist service with the impression of left lower lobe pneumonia, paroxysmal atrial fibrillation with RVR, acute on chronic HFpEF.  Cardiology consulted.  Assessment and Plan: Community-acquired pneumonia- Fever note last night. Possible recurrent aspiration. Continue Rocephin , doxycycline  therapy. Sputum cultures not collected. Blood cultures so far negative. Repeat cultures ordered due to fever. On 2L supplemental oxygen today, WBC improving. He remains afebrile, but weak. Monitor daily WBC. Continue bronchodilators.  Continue inhalers, Singulair  for COPD. Dysphagia - SLP advised esophagram which is ordered. PT/ OT advised SNF STR, TOC working on dc needs.  Paroxysmal A-fib: Heart rate improved. Cardiology advised no further work up. Continue metoprolol  and Xarelto .  Coronary artery disease status post stent DES x 2 to mid LAD. His chest pain is atypical.  Continue Plavix , statin therapy.  Chronic HFpEF: Patient got 1 dose of IV Lasix  in ED. He is euvolemic.  Continue home dose torsemide , beta-blocker, Aldactone .  Hypokalemia: Likely from IV diuresis he received in ED. Oral potassium supplements ordered. Continue to monitor daily potassium.  Type 2 diabetes mellitus: Accu-Cheks, sliding scale insulin .  Semglee  40 units  ordered.  CKD stage IIIb: Creatinine baseline 1.5-1.8. Monitor daily renal function, electrolytes. Avoid nephrotoxic risk.  Obesity class II BMI 37.48 Diet, exercise and weight reduction advised.  OSA on CPAP.      Out of bed to chair. Incentive spirometry. Nursing supportive care. Fall, aspiration precautions. Diet:  Diet Orders (From admission, onward)     Start     Ordered   12/29/23 2119  Diet heart healthy/carb modified Room service appropriate? Yes with Assist; Fluid consistency: Thin  Diet effective now       Question Answer Comment  Diet-HS Snack? Nothing   Room service appropriate? Yes with Assist   Fluid consistency: Thin      12/29/23 2118           DVT prophylaxis:  rivaroxaban  (XARELTO ) tablet 20 mg  Level of care: Telemetry Cardiac   Code Status: Full Code  Subjective: Patient is seen and examined today morning. Tmax 101.7. He feels weak, has dry cough. Admits constipation. Eating fair.  Physical Exam: Vitals:   01/03/24 0441 01/03/24 0633 01/03/24 0734 01/03/24 1143  BP: (!) 141/71  (!) 143/67   Pulse: 73 67 69 70  Resp: 18  18 16   Temp: 98.4 F (36.9 C)  98.8 F (37.1 C) 98.4 F (36.9 C)  TempSrc: Oral  Axillary Oral  SpO2: 94% 91% 94% 96%  Weight: 119.3 kg     Height:        General - Elderly obese Caucasian ill male, no apparent distress. HEENT - PERRLA, EOMI, atraumatic head, non tender sinuses. Lung - Clear, diffuse rales, rhonchi on left.  No wheezes. Heart - S1, S2 heard, no murmurs, rubs, trace pedal edema. Abdomen - Soft, non tender obese, bowel sounds good Neuro - Alert, awake and  oriented x 3, non focal exam. Skin - Warm and dry.  Data Reviewed:      Latest Ref Rng & Units 01/03/2024   10:28 AM 01/02/2024    3:01 AM 01/01/2024    3:43 AM  CBC  WBC 4.0 - 10.5 K/uL 11.9  16.7  19.5   Hemoglobin 13.0 - 17.0 g/dL 9.4  9.5  88.9   Hematocrit 39.0 - 52.0 % 31.6  32.5  37.5   Platelets 150 - 400 K/uL 309  308  345        Latest Ref Rng & Units 01/03/2024   10:28 AM 01/02/2024    3:01 AM 01/01/2024    3:43 AM  BMP  Glucose 70 - 99 mg/dL 733  850  841   BUN 8 - 23 mg/dL 24  21  22    Creatinine 0.61 - 1.24 mg/dL 8.60  8.67  8.08   Sodium 135 - 145 mmol/L 136  136  141   Potassium 3.5 - 5.1 mmol/L 3.5  3.7  3.9   Chloride 98 - 111 mmol/L 104  104  106   CO2 22 - 32 mmol/L 22  24  24    Calcium  8.9 - 10.3 mg/dL 8.6  8.6  8.9    No results found.   Family Communication: Discussed with patient, understand and agree. All questions answered.  Disposition: Status is: Inpatient Remains inpatient appropriate because: IV antibiotics, esophagram, SNF placement.  Planned Discharge Destination: Home with Home Health     Time spent: 43 minutes  Author: Concepcion Riser, MD 01/03/2024 12:17 PM Secure chat 7am to 7pm For on call review [email or web address].

## 2024-01-03 NOTE — Plan of Care (Signed)
  Problem: Health Behavior/Discharge Planning: Goal: Ability to manage health-related needs will improve Outcome: Progressing   Problem: Clinical Measurements: Goal: Respiratory complications will improve Outcome: Progressing Goal: Cardiovascular complication will be avoided Outcome: Progressing   Problem: Activity: Goal: Risk for activity intolerance will decrease Outcome: Progressing   Problem: Nutrition: Goal: Adequate nutrition will be maintained Outcome: Progressing   Problem: Elimination: Goal: Will not experience complications related to urinary retention Outcome: Progressing   Problem: Pain Managment: Goal: General experience of comfort will improve and/or be controlled Outcome: Progressing   Problem: Safety: Goal: Ability to remain free from injury will improve Outcome: Progressing

## 2024-01-03 NOTE — Progress Notes (Signed)
 Mobility Specialist Progress Note;   01/03/24 1210  Mobility  Activity Transferred from bed to chair  Level of Assistance Contact guard assist, steadying assist  Assistive Device Front wheel walker  Distance Ambulated (ft) 3 ft  Activity Response Tolerated well  Mobility Referral Yes  Mobility visit 1 Mobility  Mobility Specialist Start Time (ACUTE ONLY) 1210  Mobility Specialist Stop Time (ACUTE ONLY) 1226  Mobility Specialist Time Calculation (min) (ACUTE ONLY) 16 min   Pt requesting to get transferred to chair before getting bathed. Required MinG assistance to safely transfer from bed to chair. No c/o of dizziness. HR maintained. SPO2 93%> on RA throughout mobility. Pt left comfortably in chair with all needs met, call bell in reach.   Lauraine Erm Mobility Specialist Please contact via SecureChat or [employer] [phone]

## 2024-01-04 DIAGNOSIS — N1832 Chronic kidney disease, stage 3b: Secondary | ICD-10-CM | POA: Diagnosis not present

## 2024-01-04 DIAGNOSIS — J189 Pneumonia, unspecified organism: Secondary | ICD-10-CM | POA: Diagnosis not present

## 2024-01-04 DIAGNOSIS — I5032 Chronic diastolic (congestive) heart failure: Secondary | ICD-10-CM | POA: Diagnosis not present

## 2024-01-04 DIAGNOSIS — I4891 Unspecified atrial fibrillation: Secondary | ICD-10-CM | POA: Diagnosis not present

## 2024-01-04 LAB — CBC
HCT: 32.1 % — ABNORMAL LOW (ref 39.0–52.0)
Hemoglobin: 9.6 g/dL — ABNORMAL LOW (ref 13.0–17.0)
MCH: 22.5 pg — ABNORMAL LOW (ref 26.0–34.0)
MCHC: 29.9 g/dL — ABNORMAL LOW (ref 30.0–36.0)
MCV: 75.2 fL — ABNORMAL LOW (ref 80.0–100.0)
Platelets: 312 K/uL (ref 150–400)
RBC: 4.27 MIL/uL (ref 4.22–5.81)
RDW: 17.6 % — ABNORMAL HIGH (ref 11.5–15.5)
WBC: 10.7 K/uL — ABNORMAL HIGH (ref 4.0–10.5)
nRBC: 0 % (ref 0.0–0.2)

## 2024-01-04 LAB — BASIC METABOLIC PANEL WITH GFR
Anion gap: 8 (ref 5–15)
BUN: 26 mg/dL — ABNORMAL HIGH (ref 8–23)
CO2: 24 mmol/L (ref 22–32)
Calcium: 8.8 mg/dL — ABNORMAL LOW (ref 8.9–10.3)
Chloride: 104 mmol/L (ref 98–111)
Creatinine, Ser: 1.22 mg/dL (ref 0.61–1.24)
GFR, Estimated: >60 mL/min (ref >60)
Glucose, Bld: 226 mg/dL — ABNORMAL HIGH (ref 70–99)
Potassium: 3.6 mmol/L (ref 3.5–5.1)
Sodium: 136 mmol/L (ref 135–145)

## 2024-01-04 LAB — GLUCOSE, CAPILLARY
Glucose-Capillary: 208 mg/dL — ABNORMAL HIGH (ref 70–99)
Glucose-Capillary: 237 mg/dL — ABNORMAL HIGH (ref 70–99)
Glucose-Capillary: 226 mg/dL — ABNORMAL HIGH (ref 70–99)
Glucose-Capillary: 219 mg/dL — ABNORMAL HIGH (ref 70–99)

## 2024-01-04 LAB — HEMOGLOBIN A1C
Hgb A1c MFr Bld: 6.7 % — ABNORMAL HIGH (ref 4.8–5.6)
Mean Plasma Glucose: 145.59 mg/dL

## 2024-01-04 MED ORDER — INSULIN GLARGINE-YFGN 100 UNIT/ML ~~LOC~~ SOLN
30.0000 [IU] | Freq: Two times a day (BID) | SUBCUTANEOUS | Status: DC
  Administered 2024-01-04 – 2024-01-05 (×2): 30 [IU] via SUBCUTANEOUS
  Filled 2024-01-04 (×3): qty 0.3

## 2024-01-04 MED ORDER — SODIUM CHLORIDE 0.9 % IV SOLN
1.0000 g | INTRAVENOUS | Status: AC
  Administered 2024-01-05: 1 g via INTRAVENOUS
  Filled 2024-01-04: qty 10

## 2024-01-04 MED ORDER — DOXYCYCLINE HYCLATE 100 MG PO TABS
100.0000 mg | ORAL_TABLET | Freq: Two times a day (BID) | ORAL | Status: AC
  Administered 2024-01-04 – 2024-01-05 (×2): 100 mg via ORAL
  Filled 2024-01-04 (×2): qty 1

## 2024-01-04 NOTE — Inpatient Diabetes Management (Signed)
 Inpatient Diabetes Program Recommendations  AACE/ADA: New Consensus Statement on Inpatient Glycemic Control (2015)  Target Ranges:  Prepandial:   less than 140 mg/dL      Peak postprandial:   less than 180 mg/dL (1-2 hours)      Critically ill patients:  140 - 180 mg/dL   Lab Results  Component Value Date   GLUCAP 208 (H) 01/04/2024   HGBA1C 7.3 (H) 08/25/2023    Review of Glycemic Control  Latest Reference Range & Units 01/03/24 07:32 01/03/24 11:42 01/03/24 16:28 01/03/24 20:59 01/04/24 07:16  Glucose-Capillary 70 - 99 mg/dL 802 (H) 745 (H) 796 (H) 146 (H) 208 (H)   Diabetes history: DM 2 Outpatient Diabetes medications: Tresiba  40 units qam, 50 units qpm, Novolog  26 units breakfast, 30 units lunch, 36-44 units for dinner Current orders for Inpatient glycemic control:  Semglee  40 units Daily Novolog  0-15 units tid + hs  Inpatient Diabetes Program Recommendations:    -  Consider adding Novolog  4 units tid meal coverage if eating >50% of meals  Thanks,  Clotilda Bull RN, MSN, BC-ADM Inpatient Diabetes Coordinator Team Pager [phone] (8a-5p)

## 2024-01-04 NOTE — Progress Notes (Signed)
 Progress Note   Patient: Mike Hunter FMW:[id number] DOB: [birth year] DOA: 12/29/2023     6 DOS: the patient was seen and examined on 01/04/2024   Brief hospital course: Mike Hunter is a 73 y.o. male with medical history significant for paroxysmal A-fib on Xarelto , chronic HFpEF, COPD, CKD 3B, type 2 diabetes, hypertension, osteoporosis on teriparatide , coronary artery disease status post PCI with stent placement, obesity, OSA on CPAP, who initially presented at [facility] ED due to left-sided chest pain, worse with coughing, and associated with palpitations.  Chest x-ray with left-sided infiltrate versus atelectasis.  Patient is admitted to the hospitalist service with the impression of left lower lobe pneumonia, paroxysmal atrial fibrillation with RVR, acute on chronic HFpEF.  Cardiology consulted.  Assessment and Plan: Community-acquired pneumonia- Left basilar opacity on xray.  Last fever 7/19, possible recurrent aspiration. Continue Rocephin , doxycycline  therapy for total 7 days. Last day tomorrow. Sputum cultures not collected. Blood cultures so far negative. On 2L supplemental oxygen as needed, WBC improving. He remains afebrile for 48hr.  Monitor daily WBC. Continue bronchodilators.  Continue inhalers, Singulair  for COPD. Dysphagia - SLP advised esophagram which is ordered. PT/ OT advised SNF STR, TOC working on dc needs.  Paroxysmal A-fib: RVR on presentation. Heart rate improved. Cardiology advised no further work up. He had cardioversion 08/2023 was on amiodarone . Continue metoprolol  and Xarelto .  Coronary artery disease status post stent DES x 2 to mid LAD. Denies chest pain. Continue Plavix , statin therapy.  Chronic HFpEF: Patient got 1 dose of IV Lasix  in ED. He is euvolemic.  Continue home dose torsemide , beta-blocker, Aldactone .  Hypokalemia: Due to diuresis. Oral potassium supplements as needed. Continue to monitor daily potassium.  Type 2 diabetes  mellitus: Accu-Cheks, sliding scale insulin .  Sugars elevated. Repeat A1C ordered. Semglee  changed to 30 units BID.  CKD stage IIIb: Creatinine baseline 1.2-1.8. Monitor daily renal function, electrolytes. Avoid nephrotoxic risk.  Obesity class II BMI 37.48 Diet, exercise and weight reduction advised. Continue CPAP at night.      Out of bed to chair. Incentive spirometry. Nursing supportive care. Fall, aspiration precautions. Diet:  Diet Orders (From admission, onward)     Start     Ordered   12/29/23 2119  Diet heart healthy/carb modified Room service appropriate? Yes with Assist; Fluid consistency: Thin  Diet effective now       Question Answer Comment  Diet-HS Snack? Nothing   Room service appropriate? Yes with Assist   Fluid consistency: Thin      12/29/23 2118           DVT prophylaxis:  rivaroxaban  (XARELTO ) tablet 20 mg  Level of care: Telemetry Cardiac   Code Status: Full Code  Subjective: Patient is seen and examined today morning. He has dry cough. Eating fair. Had a bowel movement today. Feels better today. I talked to his wife over phone.  Physical Exam: Vitals:   01/04/24 0638 01/04/24 0718 01/04/24 0835 01/04/24 1122  BP:  (!) 155/83  (!) 140/82  Pulse: 69 66  74  Resp:  18  18  Temp:  98.5 F (36.9 C)  98 F (36.7 C)  TempSrc:  Oral  Oral  SpO2: 100% 97% 98%   Weight:      Height:        General - Elderly obese Caucasian ill male, no apparent distress. HEENT - PERRLA, EOMI, atraumatic head, non tender sinuses. Lung - Clear, diffuse rales, rhonchi on left.  No wheezes. Heart -  S1, S2 heard, no murmurs, rubs, trace pedal edema. Abdomen - Soft, non tender obese, bowel sounds good Neuro - Alert, awake and oriented x 3, non focal exam. Skin - Warm and dry.  Data Reviewed:      Latest Ref Rng & Units 01/04/2024    5:28 AM 01/03/2024   10:28 AM 01/02/2024    3:01 AM  CBC  WBC 4.0 - 10.5 K/uL 10.7  11.9  16.7   Hemoglobin 13.0 - 17.0  g/dL 9.6  9.4  9.5   Hematocrit 39.0 - 52.0 % 32.1  31.6  32.5   Platelets 150 - 400 K/uL 312  309  308       Latest Ref Rng & Units 01/04/2024    5:28 AM 01/03/2024   10:28 AM 01/02/2024    3:01 AM  BMP  Glucose 70 - 99 mg/dL 773  733  850   BUN 8 - 23 mg/dL 26  24  21    Creatinine 0.61 - 1.24 mg/dL 8.77  8.60  8.67   Sodium 135 - 145 mmol/L 136  136  136   Potassium 3.5 - 5.1 mmol/L 3.6  3.5  3.7   Chloride 98 - 111 mmol/L 104  104  104   CO2 22 - 32 mmol/L 24  22  24    Calcium  8.9 - 10.3 mg/dL 8.8  8.6  8.6    No results found.   Family Communication: Discussed with patient, wife over phone. They understand and agree. All questions answered.  Disposition: Status is: Inpatient Remains inpatient appropriate because: IV antibiotics for another day, esophagram, SNF placement pending bed offers.  Planned Discharge Destination: Home with Home Health     Time spent: 40 minutes  Author: Concepcion Riser, MD 01/04/2024 2:06 PM Secure chat 7am to 7pm For on call review [email or web address].

## 2024-01-04 NOTE — TOC Progression Note (Addendum)
 Transition of Care [facility]) - Progression Note    Patient Details  Name: Mike Hunter MRN: [id number] Date of Birth: [birth year]  Transition of Care [facility]) CM/SW Contact  [name], [place] Phone Number: 01/04/2024, 11:42 AM  Clinical Narrative:   PASRR approved [id number] E; approval dates 01/03/2024 - 02/02/2024.   2:31 PM CSW followed up with patient and wife providing medicare.gov ratings of current accepting SNFs. [facility] and [facility] have declined at this time. Patient would like CSW to inquire with [facility]. If not SNF, plan for [facility] CSW notified RNCM and MD.   TOC will continue to follow.    Expected Discharge Plan: Skilled Nursing Facility Barriers to Discharge: Continued Medical Work up, SNF Pending bed offer, English as a second language teacher  Expected Discharge Plan and Services In-house Referral: NA Discharge Planning Services: CM Consult Post Acute Care Choice: NA Living arrangements for the past 2 months: Single Family Home                   DME Agency: NA       HH Arranged: NA           Social Determinants of Health (SDOH) Interventions SDOH Screenings   Food Insecurity: No Food Insecurity (12/30/2023)  Housing: Low Risk  (12/30/2023)  Transportation Needs: No Transportation Needs (12/30/2023)  Utilities: Not At Risk (12/30/2023)  Social Connections: Socially Integrated (12/30/2023)  Tobacco Use: Medium Risk (12/29/2023)    Readmission Risk Interventions    12/31/2023   10:58 AM  Readmission Risk Prevention Plan  Transportation Screening Complete  Medication Review (RN Care Manager) Referral to Pharmacy  HRI or Home Care Consult Complete  SW Recovery Care/Counseling Consult Complete  Palliative Care Screening Not Applicable  Skilled Nursing Facility Not Applicable

## 2024-01-04 NOTE — Progress Notes (Signed)
 Pt would like to pass along message that he would like to d/c home with home rehab. He has previously used a company called [facility] out of [place] and would like to pursue that if possible.

## 2024-01-04 NOTE — Progress Notes (Addendum)
 Occupational Therapy Treatment Patient Details Name: Mike Hunter MRN: [id number] DOB: 10-13-1950 Today's Date: 01/04/2024   History of present illness Pt is 73 yo presenting to [facility]/15 from [facility] due to L sided chest pain that is worse with coughing and associated palpitations. Chest x-ray with findings of L sided infiltrate vs. Atelectasis. PMH: Paroxysmal a-fib on xarelto , chornic HfpEF, COPD, CKD III, DM II, HTN, osteoporosis, CAD s/p PCI with stent placement, OSA on CPAP.   OT comments  Pt at this time presented in bed and reported they were able to get up earlier today and wanted to attempt to ambulate into bathroom to urinate. At this time he was ambulate into the bathroom with RW and CGA and stood to complete urination. Pt then needed a seated rest then ambulated around to chair. Patient will benefit from continued inpatient follow up therapy, <3 hours/day. Acute Occupational Therapy to follow.     BP: sitting: 179/97 (117) Standing 140/82 (98) Post toileting: 100/87 (92)   If plan is discharge home, recommend the following:  A lot of help with bathing/dressing/bathroom;A little help with walking and/or transfers;Assistance with cooking/housework;Assist for transportation;Help with stairs or ramp for entrance   Equipment Recommendations  BSC/3in1 (bariatric)    Recommendations for Other Services      Precautions / Restrictions Precautions Precautions: Fall;Other (comment) Recall of Precautions/Restrictions: Intact Precaution/Restrictions Comments: watch BP and O2 Restrictions Weight Bearing Restrictions Per Provider Order: No       Mobility Bed Mobility Overal bed mobility: Needs Assistance Bed Mobility: Supine to Sit   Sidelying to sit: Supervision            Transfers Overall transfer level: Needs assistance Equipment used: Rolling walker (2 wheels) Transfers: Sit to/from Stand Sit to Stand: Contact guard assist                 Balance  Overall balance assessment: Needs assistance Sitting-balance support: Feet supported Sitting balance-Leahy Scale: Good     Standing balance support: Bilateral upper extremity supported Standing balance-Leahy Scale: Poor Standing balance comment: needs RW support                           ADL either performed or assessed with clinical judgement   ADL Overall ADL's : Needs assistance/impaired Eating/Feeding: Set up;Sitting   Grooming: Set up;Supervision/safety;Sitting   Upper Body Bathing: Set up;Supervision/ safety;Sitting   Lower Body Bathing: Moderate assistance;Sit to/from stand;Sitting/lateral leans;Minimal assistance   Upper Body Dressing : Contact guard assist;Sitting   Lower Body Dressing: Sitting/lateral leans;Sit to/from stand   Toilet Transfer: Contact guard assist;Cueing for safety;Cueing for sequencing   Toileting- Clothing Manipulation and Hygiene: Contact guard assist;Sit to/from stand       Functional mobility during ADLs: Contact guard assist;Rolling walker (2 wheels)      Extremity/Trunk Assessment Upper Extremity Assessment Upper Extremity Assessment: Overall WFL for tasks assessed RUE Deficits / Details: generalized weakness; mildly decreased shoulder ROM and pt reported stiffness in shoulder with AROM with pt also reporting this is baseline since sustaining cx compression fxs in January 2025 LUE Deficits / Details: generalized weakness; mildly decreased shoulder ROM and pt reported stiffness in shoulder with AROM with pt also reporting this is baseline since sustaining cx compression fxs in January 2025   Lower Extremity Assessment Lower Extremity Assessment: Defer to PT evaluation        Vision   Vision Assessment?: No apparent visual deficits  Perception Perception Perception: Within Functional Limits   Praxis Praxis Praxis: [facility]   Communication Communication Communication: Impaired Factors Affecting Communication: Hearing  impaired   Cognition Arousal: Alert Behavior During Therapy: WFL for tasks assessed/performed                                 Following commands: Intact        Cueing   Cueing Techniques: Verbal cues  Exercises      Shoulder Instructions       General Comments      Pertinent Vitals/ Pain       Pain Assessment Pain Assessment: Faces Pain Score: 0-No pain  Home Living                                          Prior Functioning/Environment              Frequency  Min 2X/week        Progress Toward Goals  OT Goals(current goals can now be found in the care plan section)  Progress towards OT goals: Progressing toward goals  Acute Rehab OT Goals Patient Stated Goal: to go to rehab now OT Goal Formulation: With patient Time For Goal Achievement: 01/15/24 Potential to Achieve Goals: Good ADL Goals Pt Will Perform Lower Body Bathing: with contact guard assist;sitting/lateral leans;sit to/from stand Pt Will Perform Lower Body Dressing: with contact guard assist;sitting/lateral leans;sit to/from stand Pt Will Transfer to Toilet: with supervision;ambulating;bedside commode Pt Will Perform Toileting - Clothing Manipulation and hygiene: with contact guard assist;sitting/lateral leans;sit to/from stand Pt/caregiver will Perform Home Exercise Program: Increased strength;Both right and left upper extremity;With theraband;Independently;With written HEP provided Additional ADL Goal #1: Patient will demonstrate ability to Independently state 3 energy conservation strategies to increase safety and independence with funcitonal tasks.  Plan      Co-evaluation                 AM-PAC OT 6 Clicks Daily Activity     Outcome Measure   Help from another person eating meals?: A Little Help from another person taking care of personal grooming?: A Little Help from another person toileting, which includes using toliet, bedpan, or urinal?: A  Little Help from another person bathing (including washing, rinsing, drying)?: A Lot Help from another person to put on and taking off regular upper body clothing?: A Little Help from another person to put on and taking off regular lower body clothing?: A Little 6 Click Score: 17    End of Session Equipment Utilized During Treatment: Gait belt;Rolling walker (2 wheels)  OT Visit Diagnosis: Unsteadiness on feet (R26.81);Other abnormalities of gait and mobility (R26.89);Muscle weakness (generalized) (M62.81);Other (comment)   Activity Tolerance Patient tolerated treatment well   Patient Left in chair;with call bell/phone within reach   Nurse Communication Mobility status        Time: 1110-1140 OT Time Calculation (min): 30 min  Charges: OT General Charges $OT Visit: 1 Visit OT Treatments $Self Care/Home Management : 23-37 mins  Warrick POUR OTR/L  Acute Rehab Services  [phone] office number   Warrick Berber 01/04/2024, 11:51 AM

## 2024-01-04 NOTE — Progress Notes (Signed)
   01/04/24 2245  BiPAP/CPAP/SIPAP  BiPAP/CPAP/SIPAP Pt Type Adult  BiPAP/CPAP/SIPAP Resmed  Mask Type Full face mask  Mask Size Large  EPAP 10 cmH2O  Flow Rate 2 lpm  Patient Home Machine No  Patient Home Mask No  Patient Home Tubing No  Auto Titrate No  Device Plugged into RED Power Outlet Yes  BiPAP/CPAP /SiPAP Vitals  Pulse Rate 76  Resp 18  SpO2 96 %

## 2024-01-04 NOTE — Plan of Care (Signed)
  Problem: Health Behavior/Discharge Planning: Goal: Ability to manage health-related needs will improve Outcome: Progressing   Problem: Clinical Measurements: Goal: Respiratory complications will improve Outcome: Progressing Goal: Cardiovascular complication will be avoided Outcome: Progressing   Problem: Activity: Goal: Risk for activity intolerance will decrease Outcome: Progressing   Problem: Nutrition: Goal: Adequate nutrition will be maintained Outcome: Progressing   Problem: Safety: Goal: Ability to remain free from injury will improve Outcome: Progressing

## 2024-01-05 ENCOUNTER — Other Ambulatory Visit (HOSPITAL_COMMUNITY): Payer: Self-pay

## 2024-01-05 ENCOUNTER — Inpatient Hospital Stay (HOSPITAL_COMMUNITY)

## 2024-01-05 DIAGNOSIS — J189 Pneumonia, unspecified organism: Secondary | ICD-10-CM | POA: Diagnosis not present

## 2024-01-05 LAB — CBC
HCT: 32.8 % — ABNORMAL LOW (ref 39.0–52.0)
Hemoglobin: 9.7 g/dL — ABNORMAL LOW (ref 13.0–17.0)
MCH: 22.3 pg — ABNORMAL LOW (ref 26.0–34.0)
MCHC: 29.6 g/dL — ABNORMAL LOW (ref 30.0–36.0)
MCV: 75.4 fL — ABNORMAL LOW (ref 80.0–100.0)
Platelets: 358 K/uL (ref 150–400)
RBC: 4.35 MIL/uL (ref 4.22–5.81)
RDW: 17.3 % — ABNORMAL HIGH (ref 11.5–15.5)
WBC: 9.2 K/uL (ref 4.0–10.5)
nRBC: 0 % (ref 0.0–0.2)

## 2024-01-05 LAB — GLUCOSE, CAPILLARY: Glucose-Capillary: 186 mg/dL — ABNORMAL HIGH (ref 70–99)

## 2024-01-05 LAB — BASIC METABOLIC PANEL WITH GFR
Anion gap: 12 (ref 5–15)
BUN: 25 mg/dL — ABNORMAL HIGH (ref 8–23)
CO2: 22 mmol/L (ref 22–32)
Calcium: 9.1 mg/dL (ref 8.9–10.3)
Chloride: 103 mmol/L (ref 98–111)
Creatinine, Ser: 1.42 mg/dL — ABNORMAL HIGH (ref 0.61–1.24)
GFR, Estimated: 52 mL/min — ABNORMAL LOW (ref >60)
Glucose, Bld: 165 mg/dL — ABNORMAL HIGH (ref 70–99)
Potassium: 3.6 mmol/L (ref 3.5–5.1)
Sodium: 137 mmol/L (ref 135–145)

## 2024-01-05 MED ORDER — TORSEMIDE 20 MG PO TABS
20.0000 mg | ORAL_TABLET | ORAL | 0 refills | Status: DC
  Filled 2024-01-05: qty 30, 30d supply, fill #0

## 2024-01-05 MED ORDER — METOPROLOL TARTRATE 25 MG PO TABS
12.5000 mg | ORAL_TABLET | Freq: Two times a day (BID) | ORAL | 0 refills | Status: DC
Start: 2024-01-05 — End: 2024-03-09
  Filled 2024-01-05: qty 30, 30d supply, fill #0

## 2024-01-05 MED ORDER — ATORVASTATIN CALCIUM 80 MG PO TABS
80.0000 mg | ORAL_TABLET | Freq: Every day | ORAL | 0 refills | Status: DC
  Filled 2024-01-05: qty 30, 30d supply, fill #0

## 2024-01-05 NOTE — Progress Notes (Signed)
 PT Cancellation Note  Patient Details Name: Mike Hunter MRN: [id number] DOB: Feb 19, 1951   Cancelled Treatment:    Reason Eval/Treat Not Completed: Other (comment) (pt actively discharging)   Loxley Schmale B Arval Brandstetter 01/05/2024, 11:02 AM Lenoard SQUIBB, PT Acute Rehabilitation Services Office: [phone]

## 2024-01-05 NOTE — TOC CM/SW Note (Addendum)
 Referral received to assist with [facility] PT/OT. Met with pt at bedside. He plans to return home with the support of his wife. Discussed preference for a [facility] agency. He prefers to use [facility] or [facility] if [facility] can't accept the referral. Contacted Shylisse with [facility] and left a VM. Will continue to f/u to assist with [facility].  11:40 - Awaiting for [facility] to return my call. Left VM # 2 for Shylisse at [facility]. Contacted Glenda at [facility] for referral, left VM. Gaetana reports that she will forward my message to [place].    11:50 - Received call from Goehner at [facility]. She accepted the referral. Pt and wife made aware that [facility] accepted the referral.  12:25 - [facility] did not return my call.

## 2024-01-05 NOTE — Plan of Care (Signed)
   Problem: Education: Goal: Knowledge of General Education information will improve Description: Including pain rating scale, medication(s)/side effects and non-pharmacologic comfort measures Outcome: Progressing   Problem: Health Behavior/Discharge Planning: Goal: Ability to manage health-related needs will improve Outcome: Progressing   Problem: Activity: Goal: Risk for activity intolerance will decrease Outcome: Progressing

## 2024-01-05 NOTE — Discharge Summary (Addendum)
 Physician Discharge Summary   Patient: Mike Hunter MRN: [id number] DOB: 1951/02/22  Admit date:     12/29/2023  Discharge date: 01/05/24  Discharge Physician: Lebron JINNY Cage   PCP: Nikki Hansel Atlas, MD   Recommendations at discharge:   Follow-up with PCP in 1 week Follow-up with outpatient cardiologist Follow-up with outpatient gastroenterologist at [facility]  Discharge Diagnoses: Principal Problem:   CAP (community acquired pneumonia)    Hospital Course: Mike Hunter is a 73 y.o. male with medical history significant for paroxysmal A-fib on Xarelto , chronic HFpEF, COPD, CKD 3B, type 2 diabetes, hypertension, osteoporosis on teriparatide , coronary artery disease status post PCI with stent placement, obesity, OSA on CPAP, who initially presented at [facility] ED due to left-sided chest pain, worse with coughing, and associated with palpitations.  Chest x-ray with left-sided infiltrate versus atelectasis.  Patient is admitted to the hospitalist service with the impression of left lower lobe pneumonia, paroxysmal atrial fibrillation with RVR. Cardiology consulted, but signed off.   Today, patient denies any new complaints.  Denies any chest pain, shortness of breath, abdominal pain, nausea/vomiting, fever/chills.  Patient very eager to discharge home with home health, refusing SNF placement.  Lives at home with his wife.    Assessment and Plan: Community-acquired pneumonia Currently afebrile, with no leukocytosis (last temp spike on 7/19) Currently saturating well on room air BC x 2 NGTD Left basilar opacity on xray S/p Rocephin , doxycycline  therapy for total 7 days Continue bronchodilators, inhalers, Singulair  PT/OT recommended SNF, patient declines wants home health, currently arranged Follow-up with PCP  Paroxysmal A-fib Heart rate currently controlled, RVR on presentation Cardiology advised no further work up S/p cardioversion 08/2023 Continue metoprolol   and Xarelto   Dysphagia Reports longstanding history of dysphagia Follows with [facility] gastroenterologist, has had esophageal dilatation in the past SLP advised esophagram, result pending PCP/GI referral to follow-up Discussed extensively with patient, stated his appointment was canceled due to him being in A-fib with RVR.  Patient will reschedule his appointment with his gastroenterologist for further management   Coronary artery disease  status post stent DES x 2 to mid LAD Denies chest pain. Continue Plavix , statin therapy   Chronic HFpEF Patient got 1 dose of IV Lasix  in ED Appears euvolemic  Continue torsemide  20 mg daily, beta-blocker, Aldactone  Supplemental potassium   Type 2 diabetes mellitus Last A1c 6.7 Continue home regimen   CKD stage IIIb Creatinine baseline 1.2-1.8. Follow-up with PCP  OSA Continue CPAP   Obesity class II Lifestyle modification advised          Consultants: Cardiology Procedures performed: None Disposition: Home health Diet recommendation:  Cardiac and Carb modified diet   DISCHARGE MEDICATION: Allergies as of 01/05/2024       Reactions   Buprenorphine Hcl Nausea And Vomiting   Other reaction(s): Nausea And Vomiting   Hydromorphone  Other (See Comments)   Other reaction(s): Mental Status Changes (intolerance) Other reaction(s): HALLUCINATIONS hallucinations hallucinations hallucinations hallucinations   Codeine Nausea And Vomiting   Mirtazapine    Other reaction(s): Other (See Comments) Nightmares & altered mental status   Morphine  And Codeine Nausea And Vomiting        Medication List     STOP taking these medications    amiodarone  200 MG tablet Commonly known as: PACERONE    mirabegron  ER 50 MG Tb24 tablet Commonly known as: MYRBETRIQ        TAKE these medications    albuterol  108 (90 Base) MCG/ACT inhaler Commonly known as: VENTOLIN   HFA Inhale 2 puffs into the lungs every 4 (four) hours as  needed for wheezing.   atorvastatin  80 MG tablet Commonly known as: LIPITOR Take 1 tablet (80 mg total) by mouth daily. Start taking on: January 06, 2024   Belsomra 15 MG Tabs Generic drug: Suvorexant Take 1 tablet by mouth at bedtime.   budesonide  0.5 MG/2ML nebulizer solution Commonly known as: PULMICORT  Take 2 mLs by nebulization 2 (two) times daily.   budesonide -formoterol  160-4.5 MCG/ACT inhaler Commonly known as: SYMBICORT Inhale 2 puffs into the lungs 2 (two) times daily.   clopidogrel  75 MG tablet Commonly known as: PLAVIX  Take 1 tablet by mouth daily.   dextromethorphan -guaiFENesin  30-600 MG 12hr tablet Commonly known as: MUCINEX  DM Take 1 tablet by mouth 2 (two) times daily. What changed:  when to take this reasons to take this   donepezil  10 MG tablet Commonly known as: ARICEPT  Take 10 mg by mouth at bedtime.   isosorbide  mononitrate 30 MG 24 hr tablet Commonly known as: IMDUR  Take 1 tablet by mouth daily.   memantine  10 MG tablet Commonly known as: NAMENDA  Take 10 mg by mouth 2 (two) times daily.   metoprolol  tartrate 25 MG tablet Commonly known as: LOPRESSOR  Take 0.5 tablets (12.5 mg total) by mouth 2 (two) times daily. What changed: how much to take   montelukast  10 MG tablet Commonly known as: SINGULAIR  Take 10 mg by mouth at bedtime.   multivitamin with minerals Tabs tablet Take 1 tablet by mouth daily.   NovoLOG  FlexPen 100 UNIT/ML injection Generic drug: insulin  aspart Inject 26-44 Units into the skin See admin instructions. 5 units prior to meals for cbg>= 150  Sliding scale (base is 26u in the AM, 30u at noon, and  36-44 at night   omeprazole 40 MG capsule Commonly known as: PRILOSEC Take 1 capsule by mouth at bedtime.   oxybutynin  10 MG 24 hr tablet Commonly known as: DITROPAN -XL Take 10 mg by mouth daily.   PRESERVISION AREDS PO Take 1 tablet by mouth daily.   sertraline  100 MG tablet Commonly known as: ZOLOFT  Take 100 mg by  mouth daily.   spironolactone  25 MG tablet Commonly known as: ALDACTONE  Take 25 mg by mouth daily.   Teriparatide  620 MCG/2.48ML Sopn Inject 2.48 mLs into the skin See admin instructions. Give 2.48 ml subcutaneous 6 times a day per patient   torsemide  20 MG tablet Commonly known as: DEMADEX  Take 1 tablet (20 mg total) by mouth See admin instructions. What changed:  how much to take additional instructions   TRESIBA  FLEXTOUCH Lyman Inject 40-50 Units into the skin See admin instructions. 40 morning, 50 night   Wixela Inhub 250-50 MCG/ACT Aepb Generic drug: fluticasone -salmeterol Inhale 1 puff into the lungs in the morning and at bedtime.   Xalatan  0.005 % ophthalmic solution Generic drug: latanoprost  Place 1 drop into both eyes at bedtime.   Xarelto  20 MG Tabs tablet Generic drug: rivaroxaban  Take 20 mg by mouth daily with supper.        Follow-up Information     [name], [name], MD. Schedule an appointment as soon as possible for a visit in 1 week(s).   Specialty: Family Medicine Contact information: [address] [place] [address] [phone]                Discharge Exam: Filed Weights   01/03/24 0441 01/04/24 0547 01/05/24 0528  Weight: 119.3 kg 119.3 kg 117.9 kg   General: NAD  Cardiovascular: S1, S2 present Respiratory: CTAB Abdomen: Soft, nontender, nondistended, bowel sounds present Musculoskeletal: No bilateral pedal edema noted Skin: Normal Psychiatry: Normal mood   Condition at discharge: stable  The results of significant diagnostics from this hospitalization (including imaging, microbiology, ancillary and laboratory) are listed below for reference.   Imaging Studies: ECHOCARDIOGRAM COMPLETE Result Date: 12/30/2023    ECHOCARDIOGRAM REPORT   Patient Name:   Mike Hunter Date of Exam: 12/30/2023 Medical Rec #:  [id number]     Height:       71.0 in Accession #:    [id number]    Weight:       268.7 lb Date of Birth:   11/07/1950      BSA:          2.391 m Patient Age:    72 years      BP:           113/69 mmHg Patient Gender: M             HR:           99 bpm. Exam Location:  Inpatient Procedure: 2D Echo, Cardiac Doppler, Color Doppler and Intracardiac            Opacification Agent (Both Spectral and Color Flow Doppler were            utilized during procedure). Indications:    CHF- Acute Diastolic I50.31  History:        Patient has prior history of Echocardiogram examinations, most                 recent 06/09/2023. CHF, CAD, Prior CABG, Chronic Kidney Disease                 and COPD; Risk Factors:Diabetes and Sleep Apnea.  Sonographer:    Koleen Popper RDCS Referring Phys: [id number] TERRY SAILOR HALL  Sonographer Comments: Suboptimal apical window. IMPRESSIONS  1. Left ventricular ejection fraction, by estimation, is 55 to 60%. The left ventricle has normal function. The left ventricle demonstrates global hypokinesis. There is moderate asymmetric left ventricular hypertrophy of the septal segment. Left ventricular diastolic parameters are indeterminate.  2. Right ventricular systolic function is normal. The right ventricular size is normal. Tricuspid regurgitation signal is inadequate for assessing PA pressure.  3. The mitral valve is normal in structure. Mild mitral valve regurgitation. No evidence of mitral stenosis.  4. The aortic valve is heavily calcified. SVI 20, DI 0.47. The aortic valve is calcified. There is moderate calcification of the aortic valve. There is mild thickening of the aortic valve. Aortic valve regurgitation is not visualized. Mild to moderate aortic valve stenosis.  5. Aortic dilatation noted. There is mild dilatation of the ascending aorta, measuring 36 mm.  6. The inferior vena cava is normal in size with greater than 50% respiratory variability, suggesting right atrial pressure of 3 mmHg. FINDINGS  Left Ventricle: Left ventricular ejection fraction, by estimation, is 55 to 60%. The left ventricle has  normal function. The left ventricle demonstrates global hypokinesis. Definity  contrast agent was given IV to delineate the left ventricular endocardial borders. The left ventricular internal cavity size was normal in size. There is moderate asymmetric left ventricular hypertrophy of the septal segment. Left ventricular diastolic parameters are indeterminate. Right Ventricle: The right ventricular size is normal. No increase in right ventricular wall thickness. Right ventricular systolic function is normal. Tricuspid regurgitation signal is inadequate for assessing PA pressure. Left [facility]: Left atrial size was normal  in size. Right [facility]: Right atrial size was normal in size. Pericardium: There is no evidence of pericardial effusion. Presence of epicardial fat layer. Mitral Valve: The mitral valve is normal in structure. Mild mitral valve regurgitation. No evidence of mitral valve stenosis. Tricuspid Valve: The tricuspid valve is normal in structure. Tricuspid valve regurgitation is not demonstrated. No evidence of tricuspid stenosis. Aortic Valve: The aortic valve is heavily calcified. SVI 20, DI 0.47. The aortic valve is calcified. There is moderate calcification of the aortic valve. There is mild thickening of the aortic valve. Aortic valve regurgitation is not visualized. Mild to moderate aortic stenosis is present. Aortic valve mean gradient measures 7.7 mmHg. Aortic valve peak gradient measures 11.6 mmHg. Aortic valve area, by VTI measures 1.79 cm. Pulmonic Valve: The pulmonic valve was normal in structure. Pulmonic valve regurgitation is not visualized. No evidence of pulmonic stenosis. Aorta: Aortic dilatation noted. There is mild dilatation of the ascending aorta, measuring 36 mm. Venous: The inferior vena cava is normal in size with greater than 50% respiratory variability, suggesting right atrial pressure of 3 mmHg. IAS/Shunts: No atrial level shunt detected by color flow Doppler.  LEFT VENTRICLE PLAX  2D LVIDd:         5.70 cm LVIDs:         4.50 cm LV PW:         1.20 cm LV IVS:        1.40 cm LVOT diam:     2.20 cm LV SV:         49 LV SV Index:   20 LVOT Area:     3.80 cm  IVC IVC diam: 2.20 cm LEFT ATRIUM             Index LA diam:        4.90 cm 2.05 cm/m LA Vol (A2C):   70.0 ml 29.28 ml/m LA Vol (A4C):   62.1 ml 25.98 ml/m LA Biplane Vol: 68.6 ml 28.70 ml/m  AORTIC VALVE AV Area (Vmax):    1.84 cm AV Area (Vmean):   1.82 cm AV Area (VTI):     1.79 cm AV Vmax:           170.50 cm/s AV Vmean:          115.733 cm/s AV VTI:            0.272 m AV Peak Grad:      11.6 mmHg AV Mean Grad:      7.7 mmHg LVOT Vmax:         82.70 cm/s LVOT Vmean:        55.300 cm/s LVOT VTI:          0.128 m LVOT/AV VTI ratio: 0.47  AORTA Ao Root diam: 3.00 cm Ao Asc diam:  3.70 cm MITRAL VALVE MV Area (PHT): 3.36 cm    SHUNTS MV Decel Time: 226 msec    Systemic VTI:  0.13 m MV E velocity: 80.60 cm/s  Systemic Diam: 2.20 cm MV A velocity: 32.60 cm/s MV E/A ratio:  2.47 Kardie Tobb DO Electronically signed by Dub Huntsman DO Signature Date/Time: 12/30/2023/3:55:33 PM    Final    DG Chest Portable 1 View Result Date: 12/29/2023 CLINICAL DATA:  Three day history of chest pain and palpitations EXAM: PORTABLE CHEST 1 VIEW COMPARISON:  Chest radiograph dated 11/06/2023 FINDINGS: Normal lung volumes. Left basilar patchy opacities. No pleural effusion or pneumothorax. Similar enlarged cardiomediastinal silhouette. Cervical spinal fixation hardware appears intact.  IMPRESSION: 1. Left basilar patchy opacities, which may represent atelectasis, aspiration, or pneumonia. 2. Similar cardiomegaly. Electronically Signed   By: Limin  Xu M.D.   On: 12/29/2023 13:33    Microbiology: Results for orders placed or performed during the hospital encounter of 12/29/23  Resp panel by RT-PCR (RSV, Flu A&B, Covid)     Status: None   Collection Time: 12/29/23 12:44 PM  Result Value Ref Range Status   SARS Coronavirus 2 by RT PCR NEGATIVE  NEGATIVE Final    Comment: (NOTE) SARS-CoV-2 target nucleic acids are NOT DETECTED.  The SARS-CoV-2 RNA is generally detectable in upper respiratory specimens during the acute phase of infection. The lowest concentration of SARS-CoV-2 viral copies this assay can detect is 138 copies/mL. A negative result does not preclude SARS-Cov-2 infection and should not be used as the sole basis for treatment or other patient management decisions. A negative result may occur with  improper specimen collection/handling, submission of specimen other than nasopharyngeal swab, presence of viral mutation(s) within the areas targeted by this assay, and inadequate number of viral copies(<138 copies/mL). A negative result must be combined with clinical observations, patient history, and epidemiological information. The expected result is Negative.  Fact Sheet for Patients:  BloggerCourse.com  Fact Sheet for Healthcare Providers:  SeriousBroker.it  This test is no t yet approved or cleared by the [place]  FDA and  has been authorized for detection and/or diagnosis of SARS-CoV-2 by FDA under an Emergency Use Authorization (EUA). This EUA will remain  in effect (meaning this test can be used) for the duration of the COVID-19 declaration under Section 564(b)(1) of the Act, 21 U.S.C.section 360bbb-3(b)(1), unless the authorization is terminated  or revoked sooner.       Influenza A by PCR NEGATIVE NEGATIVE Final   Influenza B by PCR NEGATIVE NEGATIVE Final    Comment: (NOTE) The Xpert Xpress SARS-CoV-2/FLU/RSV plus assay is intended as an aid in the diagnosis of influenza from Nasopharyngeal swab specimens and should not be used as a sole basis for treatment. Nasal washings and aspirates are unacceptable for Xpert Xpress SARS-CoV-2/FLU/RSV testing.  Fact Sheet for Patients: BloggerCourse.com  Fact Sheet for Healthcare  Providers: SeriousBroker.it  This test is not yet approved or cleared by the [place]  FDA and has been authorized for detection and/or diagnosis of SARS-CoV-2 by FDA under an Emergency Use Authorization (EUA). This EUA will remain in effect (meaning this test can be used) for the duration of the COVID-19 declaration under Section 564(b)(1) of the Act, 21 U.S.C. section 360bbb-3(b)(1), unless the authorization is terminated or revoked.     Resp Syncytial Virus by PCR NEGATIVE NEGATIVE Final    Comment: (NOTE) Fact Sheet for Patients: BloggerCourse.com  Fact Sheet for Healthcare Providers: SeriousBroker.it  This test is not yet approved or cleared by the [place]  FDA and has been authorized for detection and/or diagnosis of SARS-CoV-2 by FDA under an Emergency Use Authorization (EUA). This EUA will remain in effect (meaning this test can be used) for the duration of the COVID-19 declaration under Section 564(b)(1) of the Act, 21 U.S.C. section 360bbb-3(b)(1), unless the authorization is terminated or revoked.  Performed at [facility], [address] [place], [place] [address]   Culture, blood (routine x 2)     Status: None   Collection Time: 12/29/23  2:00 PM   Specimen: BLOOD  Result Value Ref Range Status   Specimen Description   Final    BLOOD LEFT  ANTECUBITAL Performed at [facility], [address] [place], [place] [address]    Special Requests   Final    BOTTLES DRAWN AEROBIC AND ANAEROBIC Blood Culture adequate volume Performed at [facility], [address] [place], [place] [address]    Culture   Final    NO GROWTH 5 DAYS Performed at [facility], [address], [place], [place] [address]    Report Status 01/03/2024 FINAL  Final  Culture, blood (routine x 2)     Status: None   Collection Time: 12/29/23  8:23 PM   Specimen: BLOOD  RIGHT ARM  Result Value Ref Range Status   Specimen Description BLOOD RIGHT ARM  Final   Special Requests   Final    BOTTLES DRAWN AEROBIC AND ANAEROBIC Blood Culture adequate volume   Culture   Final    NO GROWTH 5 DAYS Performed at [facility], [address] [place], [place] [address]    Report Status 01/03/2024 FINAL  Final  Culture, blood (Routine X 2) w Reflex to ID Panel     Status: None (Preliminary result)   Collection Time: 01/02/24  4:50 PM   Specimen: BLOOD RIGHT ARM  Result Value Ref Range Status   Specimen Description BLOOD RIGHT ARM  Final   Special Requests   Final    BOTTLES DRAWN AEROBIC AND ANAEROBIC Blood Culture results may not be optimal due to an inadequate volume of blood received in culture bottles   Culture   Final    NO GROWTH 3 DAYS Performed at [facility], [address] [place], [place] [address]    Report Status PENDING  Incomplete  Culture, blood (Routine X 2) w Reflex to ID Panel     Status: None (Preliminary result)   Collection Time: 01/02/24  4:50 PM   Specimen: BLOOD RIGHT HAND  Result Value Ref Range Status   Specimen Description BLOOD RIGHT HAND  Final   Special Requests   Final    BOTTLES DRAWN AEROBIC AND ANAEROBIC Blood Culture adequate volume   Culture   Final    NO GROWTH 3 DAYS Performed at [facility], [address], [place], [place] [address]    Report Status PENDING  Incomplete    Labs: CBC: Recent Labs  Lab 01/01/24 0343 01/02/24 0301 01/03/24 1028 01/04/24 0528 01/05/24 0554  WBC 19.5* 16.7* 11.9* 10.7* 9.2  HGB 11.0* 9.5* 9.4* 9.6* 9.7*  HCT 37.5* 32.5* 31.6* 32.1* 32.8*  MCV 76.1* 75.6* 75.4* 75.2* 75.4*  PLT 345 308 309 312 358   Basic Metabolic Panel: Recent Labs  Lab 12/29/23 1232 12/30/23 0400 12/31/23 0450 01/01/24 0343 01/02/24 0301 01/03/24 1028 01/04/24 0528 01/05/24 0554  NA 139 141   < > 141 136 136 136 137  K 3.6 3.2*   < > 3.9 3.7 3.5 3.6 3.6  CL 102 105   < > 106  104 104 104 103  CO2 24 25   < > 24 24 22 24 22   GLUCOSE 189* 170*   < > 158* 149* 266* 226* 165*  BUN 28* 28*   < > 22 21 24* 26* 25*  CREATININE 1.82* 1.85*   < > 1.91* 1.32* 1.39* 1.22 1.42*  CALCIUM  8.7* 8.4*   < > 8.9 8.6* 8.6* 8.8* 9.1  MG 2.5* 2.4  --   --   --   --   --   --  PHOS  --  4.5  --   --   --   --   --   --    < > = values in this interval not displayed.   Liver Function Tests: Recent Labs  Lab 12/29/23 1232  AST 26  ALT 32  ALKPHOS 111  BILITOT 0.4  PROT 7.6  ALBUMIN 4.0   CBG: Recent Labs  Lab 01/04/24 0716 01/04/24 1120 01/04/24 1620 01/04/24 2113 01/05/24 0733  GLUCAP 208* 237* 226* 219* 186*    Discharge time spent: greater than 30 minutes.  Signed: Lebron JINNY Cage, MD [facility] 01/05/2024

## 2024-01-07 LAB — CULTURE, BLOOD (ROUTINE X 2)
Culture: NO GROWTH
Culture: NO GROWTH
Special Requests: ADEQUATE

## 2024-02-25 ENCOUNTER — Emergency Department (HOSPITAL_BASED_OUTPATIENT_CLINIC_OR_DEPARTMENT_OTHER)

## 2024-02-25 ENCOUNTER — Encounter (HOSPITAL_BASED_OUTPATIENT_CLINIC_OR_DEPARTMENT_OTHER): Payer: Self-pay | Admitting: Emergency Medicine

## 2024-02-25 ENCOUNTER — Inpatient Hospital Stay (HOSPITAL_BASED_OUTPATIENT_CLINIC_OR_DEPARTMENT_OTHER)
Admission: EM | Admit: 2024-02-25 | Discharge: 2024-03-09 | DRG: 276 | Disposition: A | Discharge status: Home / Self Care | Attending: Internal Medicine | Admitting: Internal Medicine

## 2024-02-25 ENCOUNTER — Other Ambulatory Visit: Payer: Self-pay

## 2024-02-25 DIAGNOSIS — I493 Ventricular premature depolarization: Secondary | ICD-10-CM | POA: Diagnosis not present

## 2024-02-25 DIAGNOSIS — N1832 Chronic kidney disease, stage 3b: Secondary | ICD-10-CM | POA: Diagnosis present

## 2024-02-25 DIAGNOSIS — Z79899 Other long term (current) drug therapy: Secondary | ICD-10-CM

## 2024-02-25 DIAGNOSIS — R079 Chest pain, unspecified: Secondary | ICD-10-CM | POA: Diagnosis present

## 2024-02-25 DIAGNOSIS — I4819 Other persistent atrial fibrillation: Secondary | ICD-10-CM | POA: Diagnosis not present

## 2024-02-25 DIAGNOSIS — I5043 Acute on chronic combined systolic (congestive) and diastolic (congestive) heart failure: Secondary | ICD-10-CM | POA: Diagnosis not present

## 2024-02-25 DIAGNOSIS — E1151 Type 2 diabetes mellitus with diabetic peripheral angiopathy without gangrene: Secondary | ICD-10-CM | POA: Diagnosis present

## 2024-02-25 DIAGNOSIS — I255 Ischemic cardiomyopathy: Secondary | ICD-10-CM | POA: Diagnosis present

## 2024-02-25 DIAGNOSIS — L8990 Pressure ulcer of unspecified site, unspecified stage: Secondary | ICD-10-CM | POA: Diagnosis present

## 2024-02-25 DIAGNOSIS — I130 Hypertensive heart and chronic kidney disease with heart failure and stage 1 through stage 4 chronic kidney disease, or unspecified chronic kidney disease: Secondary | ICD-10-CM | POA: Diagnosis present

## 2024-02-25 DIAGNOSIS — E7800 Pure hypercholesterolemia, unspecified: Secondary | ICD-10-CM | POA: Diagnosis present

## 2024-02-25 DIAGNOSIS — Z885 Allergy status to narcotic agent status: Secondary | ICD-10-CM

## 2024-02-25 DIAGNOSIS — R002 Palpitations: Secondary | ICD-10-CM | POA: Diagnosis present

## 2024-02-25 DIAGNOSIS — Z87891 Personal history of nicotine dependence: Secondary | ICD-10-CM

## 2024-02-25 DIAGNOSIS — Z794 Long term (current) use of insulin: Secondary | ICD-10-CM

## 2024-02-25 DIAGNOSIS — R110 Nausea: Secondary | ICD-10-CM

## 2024-02-25 DIAGNOSIS — G4733 Obstructive sleep apnea (adult) (pediatric): Secondary | ICD-10-CM | POA: Diagnosis present

## 2024-02-25 DIAGNOSIS — M4854XG Collapsed vertebra, not elsewhere classified, thoracic region, subsequent encounter for fracture with delayed healing: Secondary | ICD-10-CM | POA: Diagnosis present

## 2024-02-25 DIAGNOSIS — I4892 Unspecified atrial flutter: Principal | ICD-10-CM | POA: Diagnosis present

## 2024-02-25 DIAGNOSIS — E1165 Type 2 diabetes mellitus with hyperglycemia: Secondary | ICD-10-CM | POA: Diagnosis present

## 2024-02-25 DIAGNOSIS — E1140 Type 2 diabetes mellitus with diabetic neuropathy, unspecified: Secondary | ICD-10-CM | POA: Diagnosis present

## 2024-02-25 DIAGNOSIS — M810 Age-related osteoporosis without current pathological fracture: Secondary | ICD-10-CM | POA: Diagnosis present

## 2024-02-25 DIAGNOSIS — I2510 Atherosclerotic heart disease of native coronary artery without angina pectoris: Secondary | ICD-10-CM | POA: Diagnosis present

## 2024-02-25 DIAGNOSIS — E66812 Obesity, class 2: Secondary | ICD-10-CM | POA: Diagnosis present

## 2024-02-25 DIAGNOSIS — J9601 Acute respiratory failure with hypoxia: Secondary | ICD-10-CM | POA: Diagnosis not present

## 2024-02-25 DIAGNOSIS — I252 Old myocardial infarction: Secondary | ICD-10-CM

## 2024-02-25 DIAGNOSIS — Z7901 Long term (current) use of anticoagulants: Secondary | ICD-10-CM

## 2024-02-25 DIAGNOSIS — E8770 Fluid overload, unspecified: Secondary | ICD-10-CM | POA: Diagnosis present

## 2024-02-25 DIAGNOSIS — F319 Bipolar disorder, unspecified: Secondary | ICD-10-CM | POA: Diagnosis present

## 2024-02-25 DIAGNOSIS — I4891 Unspecified atrial fibrillation: Secondary | ICD-10-CM | POA: Diagnosis present

## 2024-02-25 DIAGNOSIS — T501X5A Adverse effect of loop [high-ceiling] diuretics, initial encounter: Secondary | ICD-10-CM | POA: Diagnosis not present

## 2024-02-25 DIAGNOSIS — I350 Nonrheumatic aortic (valve) stenosis: Secondary | ICD-10-CM | POA: Diagnosis present

## 2024-02-25 DIAGNOSIS — R001 Bradycardia, unspecified: Secondary | ICD-10-CM | POA: Diagnosis present

## 2024-02-25 DIAGNOSIS — Z955 Presence of coronary angioplasty implant and graft: Secondary | ICD-10-CM

## 2024-02-25 DIAGNOSIS — Z8614 Personal history of Methicillin resistant Staphylococcus aureus infection: Secondary | ICD-10-CM

## 2024-02-25 DIAGNOSIS — J441 Chronic obstructive pulmonary disease with (acute) exacerbation: Secondary | ICD-10-CM | POA: Diagnosis present

## 2024-02-25 DIAGNOSIS — D631 Anemia in chronic kidney disease: Secondary | ICD-10-CM | POA: Diagnosis present

## 2024-02-25 DIAGNOSIS — E119 Type 2 diabetes mellitus without complications: Secondary | ICD-10-CM

## 2024-02-25 DIAGNOSIS — J440 Chronic obstructive pulmonary disease with acute lower respiratory infection: Secondary | ICD-10-CM | POA: Diagnosis present

## 2024-02-25 DIAGNOSIS — Z6839 Body mass index (BMI) 39.0-39.9, adult: Secondary | ICD-10-CM

## 2024-02-25 DIAGNOSIS — N179 Acute kidney failure, unspecified: Secondary | ICD-10-CM | POA: Diagnosis present

## 2024-02-25 DIAGNOSIS — I472 Ventricular tachycardia, unspecified: Secondary | ICD-10-CM | POA: Diagnosis not present

## 2024-02-25 DIAGNOSIS — Z7902 Long term (current) use of antithrombotics/antiplatelets: Secondary | ICD-10-CM

## 2024-02-25 DIAGNOSIS — I959 Hypotension, unspecified: Secondary | ICD-10-CM | POA: Diagnosis not present

## 2024-02-25 DIAGNOSIS — E1122 Type 2 diabetes mellitus with diabetic chronic kidney disease: Secondary | ICD-10-CM | POA: Diagnosis present

## 2024-02-25 DIAGNOSIS — Z888 Allergy status to other drugs, medicaments and biological substances status: Secondary | ICD-10-CM

## 2024-02-25 DIAGNOSIS — J209 Acute bronchitis, unspecified: Secondary | ICD-10-CM | POA: Diagnosis present

## 2024-02-25 DIAGNOSIS — Z87442 Personal history of urinary calculi: Secondary | ICD-10-CM

## 2024-02-25 DIAGNOSIS — Z7951 Long term (current) use of inhaled steroids: Secondary | ICD-10-CM

## 2024-02-25 HISTORY — DX: Unspecified atrial fibrillation: I48.91

## 2024-02-25 LAB — PRO BRAIN NATRIURETIC PEPTIDE: Pro Brain Natriuretic Peptide: 2137 pg/mL — ABNORMAL HIGH (ref <300.0)

## 2024-02-25 LAB — BASIC METABOLIC PANEL WITH GFR
Anion gap: 14 (ref 5–15)
BUN: 27 mg/dL — ABNORMAL HIGH (ref 8–23)
CO2: 25 mmol/L (ref 22–32)
Calcium: 8.4 mg/dL — ABNORMAL LOW (ref 8.9–10.3)
Chloride: 101 mmol/L (ref 98–111)
Creatinine, Ser: 1.55 mg/dL — ABNORMAL HIGH (ref 0.61–1.24)
GFR, Estimated: 46 mL/min — ABNORMAL LOW (ref >60)
Glucose, Bld: 203 mg/dL — ABNORMAL HIGH (ref 70–99)
Potassium: 3.7 mmol/L (ref 3.5–5.1)
Sodium: 140 mmol/L (ref 135–145)

## 2024-02-25 LAB — URINALYSIS, ROUTINE W REFLEX MICROSCOPIC
Bilirubin Urine: NEGATIVE
Glucose, UA: >=500 mg/dL — AB
Hgb urine dipstick: NEGATIVE
Ketones, ur: NEGATIVE mg/dL
Leukocytes,Ua: NEGATIVE
Nitrite: NEGATIVE
Protein, ur: NEGATIVE mg/dL
Specific Gravity, Urine: <=1.005 (ref 1.005–1.030)
pH: 5.5 (ref 5.0–8.0)

## 2024-02-25 LAB — CBC
HCT: 34.7 % — ABNORMAL LOW (ref 39.0–52.0)
Hemoglobin: 10.2 g/dL — ABNORMAL LOW (ref 13.0–17.0)
MCH: 21.7 pg — ABNORMAL LOW (ref 26.0–34.0)
MCHC: 29.4 g/dL — ABNORMAL LOW (ref 30.0–36.0)
MCV: 74 fL — ABNORMAL LOW (ref 80.0–100.0)
Platelets: 388 K/uL (ref 150–400)
RBC: 4.69 MIL/uL (ref 4.22–5.81)
RDW: 17.5 % — ABNORMAL HIGH (ref 11.5–15.5)
WBC: 11.4 K/uL — ABNORMAL HIGH (ref 4.0–10.5)
nRBC: 0 % (ref 0.0–0.2)

## 2024-02-25 LAB — HEPATIC FUNCTION PANEL
ALT: 22 U/L (ref 0–44)
AST: 20 U/L (ref 15–41)
Albumin: 4.1 g/dL (ref 3.5–5.0)
Alkaline Phosphatase: 113 U/L (ref 38–126)
Bilirubin, Direct: 0.2 mg/dL (ref 0.0–0.2)
Indirect Bilirubin: 0.2 mg/dL — ABNORMAL LOW (ref 0.3–0.9)
Total Bilirubin: 0.4 mg/dL (ref 0.0–1.2)
Total Protein: 7.3 g/dL (ref 6.5–8.1)

## 2024-02-25 LAB — LIPASE, BLOOD: Lipase: 35 U/L (ref 11–51)

## 2024-02-25 LAB — URINALYSIS, MICROSCOPIC (REFLEX)

## 2024-02-25 LAB — TROPONIN T, HIGH SENSITIVITY
Troponin T High Sensitivity: 31 ng/L — ABNORMAL HIGH (ref 0–19)
Troponin T High Sensitivity: 31 ng/L — ABNORMAL HIGH (ref 0–19)

## 2024-02-25 LAB — LACTIC ACID, PLASMA: Lactic Acid, Venous: 1.2 mmol/L (ref 0.5–1.9)

## 2024-02-25 MED ORDER — FENTANYL CITRATE PF 50 MCG/ML IJ SOSY
100.0000 ug | PREFILLED_SYRINGE | Freq: Once | INTRAMUSCULAR | Status: AC
  Administered 2024-02-25: 100 ug via INTRAVENOUS
  Filled 2024-02-25: qty 2

## 2024-02-25 MED ORDER — ONDANSETRON HCL 4 MG/2ML IJ SOLN
4.0000 mg | Freq: Once | INTRAMUSCULAR | Status: AC
  Administered 2024-02-25: 4 mg via INTRAVENOUS

## 2024-02-25 MED ORDER — METOPROLOL TARTRATE 5 MG/5ML IV SOLN
5.0000 mg | Freq: Once | INTRAVENOUS | Status: AC
  Administered 2024-02-25: 5 mg via INTRAVENOUS
  Filled 2024-02-25: qty 5

## 2024-02-25 MED ORDER — IOHEXOL 350 MG/ML SOLN
80.0000 mL | Freq: Once | INTRAVENOUS | Status: AC | PRN
  Administered 2024-02-25: 100 mL via INTRAVENOUS

## 2024-02-25 MED ORDER — SODIUM CHLORIDE 0.9 % IV BOLUS
500.0000 mL | Freq: Once | INTRAVENOUS | Status: AC
  Administered 2024-02-25: 500 mL via INTRAVENOUS

## 2024-02-25 MED ORDER — FENTANYL CITRATE PF 50 MCG/ML IJ SOSY
50.0000 ug | PREFILLED_SYRINGE | Freq: Once | INTRAMUSCULAR | Status: AC
  Administered 2024-02-25: 50 ug via INTRAVENOUS
  Filled 2024-02-25: qty 1

## 2024-02-25 MED ORDER — DILTIAZEM HCL 25 MG/5ML IV SOLN
10.0000 mg | Freq: Once | INTRAVENOUS | Status: AC
  Administered 2024-02-25: 10 mg via INTRAVENOUS
  Filled 2024-02-25: qty 5

## 2024-02-25 MED ORDER — ASPIRIN 81 MG PO CHEW
324.0000 mg | CHEWABLE_TABLET | Freq: Once | ORAL | Status: AC
  Administered 2024-02-25: 324 mg via ORAL
  Filled 2024-02-25: qty 4

## 2024-02-25 MED ORDER — ONDANSETRON HCL 4 MG/2ML IJ SOLN
INTRAMUSCULAR | Status: AC
  Filled 2024-02-25: qty 2

## 2024-02-25 MED ORDER — DILTIAZEM HCL-DEXTROSE 125-5 MG/125ML-% IV SOLN (PREMIX)
10.0000 mg/h | INTRAVENOUS | Status: DC
  Administered 2024-02-25 – 2024-02-26 (×2): 10 mg/h via INTRAVENOUS
  Filled 2024-02-25 (×3): qty 125

## 2024-02-25 NOTE — ED Provider Notes (Signed)
 [place] [facility] Provider Note   CSN: [id number] Arrival date & time: 02/25/24  1457     Patient presents with: Palpitations and Chest Pain   Mike Hunter is a 73 y.o. male.   73 year old male presents for evaluation of palpitations.  Has a history of A-fib.  States his heart rate has been in the 130s all day and he has been very short of breath.  Describing chest pressure as well as started today radiating down the left arm.  States his chest feels very heavy.  States he increased his metoprolol  to 25 mg twice daily today as it was 12.5 mg twice daily yesterday when he saw his doctor.  Denies any other symptoms or concerns.   Palpitations Associated symptoms: chest pain and shortness of breath   Associated symptoms: no back pain, no cough and no vomiting   Chest Pain Associated symptoms: palpitations and shortness of breath   Associated symptoms: no abdominal pain, no back pain, no cough, no fever and no vomiting        Prior to Admission medications   Medication Sig Start Date End Date Taking? Authorizing Provider  albuterol  (PROVENTIL  HFA;VENTOLIN  HFA) 108 (90 BASE) MCG/ACT inhaler Inhale 2 puffs into the lungs every 4 (four) hours as needed for wheezing.    [provider]  atorvastatin  (LIPITOR ) 80 MG tablet Take 1 tablet (80 mg total) by mouth daily. 01/06/24 02/05/24  [name], [name], MD  budesonide  (PULMICORT ) 0.5 MG/2ML nebulizer solution Take 2 mLs by nebulization 2 (two) times daily.    [provider]  budesonide -formoterol  (SYMBICORT) 160-4.5 MCG/ACT inhaler Inhale 2 puffs into the lungs 2 (two) times daily. 07/03/16   [provider]  clopidogrel  (PLAVIX ) 75 MG tablet Take 1 tablet by mouth daily. 05/18/23 05/17/24  [provider]  dextromethorphan -guaiFENesin  (MUCINEX  DM) 30-600 MG 12hr tablet Take 1 tablet by mouth 2 (two) times daily. Patient taking differently: Take 1 tablet by mouth  daily as needed for cough. 06/10/23   Cheryle Page, MD  donepezil  (ARICEPT ) 10 MG tablet Take 10 mg by mouth at bedtime.    [provider]  insulin  aspart (NOVOLOG  FLEXPEN) 100 UNIT/ML injection Inject 26-44 Units into the skin See admin instructions. 5 units prior to meals for cbg>= 150  Sliding scale (base is 26u in the AM, 30u at noon, and  36-44 at night    [provider]  Insulin  Degludec (TRESIBA  FLEXTOUCH [place]) Inject 40-50 Units into the skin See admin instructions. 40 morning, 50 night    [provider]  isosorbide  mononitrate (IMDUR ) 30 MG 24 hr tablet Take 1 tablet by mouth daily. 01/22/22   [provider]  latanoprost  (XALATAN ) 0.005 % ophthalmic solution Place 1 drop into both eyes at bedtime.      [provider]  memantine  (NAMENDA ) 10 MG tablet Take 10 mg by mouth 2 (two) times daily.    [provider]  metoprolol  tartrate (LOPRESSOR ) 25 MG tablet Take 0.5 tablets (12.5 mg total) by mouth 2 (two) times daily. 01/05/24   [name], [name], MD  montelukast  (SINGULAIR ) 10 MG tablet Take 10 mg by mouth at bedtime.    [provider]  Multiple Vitamin (MULTIVITAMIN WITH MINERALS) TABS tablet Take 1 tablet by mouth daily.    [provider]  Multiple Vitamins-Minerals (PRESERVISION AREDS PO) Take 1 tablet by mouth daily.    [provider]  omeprazole (PRILOSEC) 40 MG capsule  Take 1 capsule by mouth at bedtime. 02/02/17   [provider]  oxybutynin  (DITROPAN -XL) 10 MG 24 hr tablet Take 10 mg by mouth daily.    [provider]  sertraline  (ZOLOFT ) 100 MG tablet Take 100 mg by mouth daily.    [provider]  spironolactone  (ALDACTONE ) 25 MG tablet Take 25 mg by mouth daily.    [provider]  Suvorexant (BELSOMRA) 15 MG TABS Take 1 tablet by mouth at bedtime.    [provider]  Teriparatide  620 MCG/2.48ML SOPN Inject 2.48 mLs into the skin See admin  instructions. Give 2.48 ml subcutaneous 6 times a day per patient 11/26/23   [provider]  torsemide  (DEMADEX ) 20 MG tablet Take 1 tablet (20 mg total) by mouth See admin instructions. 01/05/24 02/04/24  [name], [name], MD  WIXELA INHUB 250-50 MCG/ACT AEPB Inhale 1 puff into the lungs in the morning and at bedtime. 04/20/23   [provider]  XARELTO  20 MG TABS tablet Take 20 mg by mouth daily with supper. 04/04/22   [provider]    Allergies: Buprenorphine hcl, Hydromorphone , Codeine, Mirtazapine, and Morphine  and codeine    Review of Systems  Constitutional:  Negative for chills and fever.  HENT:  Negative for ear pain and sore throat.   Eyes:  Negative for pain and visual disturbance.  Respiratory:  Positive for shortness of breath. Negative for cough.   Cardiovascular:  Positive for chest pain and palpitations.  Gastrointestinal:  Negative for abdominal pain and vomiting.  Genitourinary:  Negative for dysuria and hematuria.  Musculoskeletal:  Negative for arthralgias and back pain.  Skin:  Negative for color change and rash.  Neurological:  Negative for seizures and syncope.  All other systems reviewed and are negative.   Updated Vital Signs BP 92/63   Pulse 86   Temp 98.1 F (36.7 C)   Resp 17   Ht 5' 11 (1.803 m)   Wt 120.2 kg   SpO2 100%   BMI 36.96 kg/m   Physical Exam Vitals and nursing note reviewed.  Constitutional:      General: He is not in acute distress.    Appearance: He is well-developed. He is ill-appearing.  HENT:     Head: Normocephalic and atraumatic.  Eyes:     Conjunctiva/sclera: Conjunctivae normal.  Cardiovascular:     Rate and Rhythm: Regular rhythm. Tachycardia present.     Heart sounds: No murmur heard. Pulmonary:     Effort: Pulmonary effort is normal. No tachypnea, accessory muscle usage or respiratory distress.     Breath sounds: Normal breath sounds. No stridor.  Abdominal:     Palpations: Abdomen is  soft.     Tenderness: There is no abdominal tenderness.  Musculoskeletal:        General: No swelling.     Cervical back: Neck supple.  Skin:    General: Skin is warm and dry.     Capillary Refill: Capillary refill takes less than 2 seconds.  Neurological:     Mental Status: He is alert.  Psychiatric:        Mood and Affect: Mood normal.     (all labs ordered are listed, but only abnormal results are displayed) Labs Reviewed  BASIC METABOLIC PANEL WITH GFR - Abnormal; Notable for the following components:      Result Value   Glucose, Bld 203 (*)    BUN 27 (*)    Creatinine, Ser 1.55 (*)  Calcium  8.4 (*)    GFR, Estimated 46 (*)    All other components within normal limits  CBC - Abnormal; Notable for the following components:   WBC 11.4 (*)    Hemoglobin 10.2 (*)    HCT 34.7 (*)    MCV 74.0 (*)    MCH 21.7 (*)    MCHC 29.4 (*)    RDW 17.5 (*)    All other components within normal limits  PRO BRAIN NATRIURETIC PEPTIDE - Abnormal; Notable for the following components:   Pro Brain Natriuretic Peptide 2,137.0 (*)    All other components within normal limits  HEPATIC FUNCTION PANEL - Abnormal; Notable for the following components:   Indirect Bilirubin 0.2 (*)    All other components within normal limits  URINALYSIS, ROUTINE W REFLEX MICROSCOPIC - Abnormal; Notable for the following components:   Glucose, UA >=500 (*)    All other components within normal limits  URINALYSIS, MICROSCOPIC (REFLEX) - Abnormal; Notable for the following components:   Bacteria, UA RARE (*)    All other components within normal limits  TROPONIN T, HIGH SENSITIVITY - Abnormal; Notable for the following components:   Troponin T High Sensitivity 31 (*)    All other components within normal limits  TROPONIN T, HIGH SENSITIVITY - Abnormal; Notable for the following components:   Troponin T High Sensitivity 31 (*)    All other components within normal limits  LIPASE, BLOOD  LACTIC ACID, PLASMA     EKG: EKG Interpretation Date/Time:  Thursday February 25 2024 15:52:26 EDT Ventricular Rate:  128 PR Interval:  103 QRS Duration:  107 QT Interval:  352 QTC Calculation: 514 R Axis:   147  Text Interpretation: Sinus tachycardia Right axis deviation RSR' in V1 or V2, probably normal variant Prolonged QT interval Shows no change when compared to EKG from earlier today Confirmed by Gennaro Bouchard ([id number]) on 02/25/2024 3:58:33 PM  Radiology: CT Angio Chest/Abd/Pel for Dissection W and/or Wo Contrast Result Date: 02/25/2024 CLINICAL DATA:  Evaluate for dissection. EXAM: CT ANGIOGRAPHY CHEST, ABDOMEN AND PELVIS TECHNIQUE: Noncontrast chest CT was performed. Multidetector CT imaging through the chest, abdomen and pelvis was performed using the standard protocol during bolus administration of intravenous contrast. Multiplanar reconstructed images and MIPs were obtained and reviewed to evaluate the vascular anatomy. RADIATION DOSE REDUCTION: This exam was performed according to the departmental dose-optimization program which includes automated exposure control, adjustment of the mA and/or kV according to patient size and/or use of iterative reconstruction technique. CONTRAST:  OMNIPAQUE  IOHEXOL  350 MG/ML SOLN COMPARISON:  CT PE protocol 08/24/2023. CT chest abdomen and pelvis 01/30/2023. CT chest 01/26/2020. FINDINGS: CTA CHEST FINDINGS Cardiovascular: Aorta is normal in size. There is no evidence for aortic dissection or aneurysm. Origin of the great vessels appears patent. Aberrant right subclavian artery present. There are mild calcified atherosclerotic plaque throughout the aorta. The heart is mildly enlarged. There is no pericardial effusion. There are atherosclerotic calcifications of the coronary arteries. Mediastinum/Nodes: No enlarged mediastinal, hilar, or axillary lymph nodes. Thyroid gland, trachea, and esophagus demonstrate no significant findings. Lungs/Pleura: Right lower lobe  nodular density peripherally measures 7 mm image 6/87, unchanged. There is also stable right lower lobe nodule image 6/81 measuring 5 mm. These are unchanged from 2021 and favored as benign. The lungs are otherwise clear. There is no pleural effusion or pneumothorax. Musculoskeletal: Cervical spinal fusion plate is present. There is T10 compression fracture which appears acute or subacute and has progressed compared to  the prior study. Review of the MIP images confirms the above findings. CTA ABDOMEN AND PELVIS FINDINGS VASCULAR Aorta: Normal caliber aorta without aneurysm, dissection, vasculitis or significant stenosis. Mild calcified atherosclerotic disease present. Celiac: Patent without evidence of aneurysm, dissection, vasculitis or significant stenosis. SMA: Patent without evidence of aneurysm, dissection, vasculitis or significant stenosis. Renals: Both renal arteries are patent without evidence of aneurysm, dissection, vasculitis, fibromuscular dysplasia or significant stenosis. IMA: Patent without evidence of aneurysm, dissection, vasculitis or significant stenosis. Inflow: Patent without evidence of aneurysm, dissection, vasculitis or significant stenosis. Veins: No obvious venous abnormality within the limitations of this arterial phase study. Review of the MIP images confirms the above findings. NON-VASCULAR Hepatobiliary: No focal liver abnormality is seen. No gallstones, gallbladder wall thickening, or biliary dilatation. Pancreas: Unremarkable. No pancreatic ductal dilatation or surrounding inflammatory changes. Spleen: Normal in size without focal abnormality. Adrenals/Urinary Tract: Bilateral renal cysts are present. The largest is in the superior pole of the left kidney measuring 3.3 cm. There is no hydronephrosis or perinephric fluid. Adrenal glands and bladder are within normal limits. Stomach/Bowel: Stomach is within normal limits. Appendix appears normal. No evidence of bowel wall thickening,  distention, or inflammatory changes. Lymphatic: No enlarged lymph nodes are seen. Reproductive: TURP defect again noted. Other: There is a small fat containing umbilical hernia. There is no ascites. Musculoskeletal: Prominent Schmorl's node of L3 is unchanged. Review of the MIP images confirms the above findings. IMPRESSION: 1. No evidence for aortic dissection or aneurysm. 2. No acute cardiopulmonary process. 3. T10 compression fracture has progressed compared to the prior study. 4. No acute localizing process in the abdomen or pelvis. Aortic Atherosclerosis (ICD10-I70.0). Electronically Signed   By: Greig Pique M.D.   On: 02/25/2024 16:42     Procedures   Medications Ordered in the ED  diltiazem  (CARDIZEM ) 125 mg in dextrose  5% 125 mL (1 mg/mL) infusion (10 mg/hr Intravenous New Bag/Given 02/25/24 1551)  sodium chloride  0.9 % bolus 500 mL (0 mLs Intravenous Stopped 02/25/24 1722)  diltiazem  (CARDIZEM ) injection 10 mg (10 mg Intravenous Given 02/25/24 1528)  aspirin  chewable tablet 324 mg (324 mg Oral Given 02/25/24 1524)  fentaNYL  (SUBLIMAZE ) injection 50 mcg (50 mcg Intravenous Given 02/25/24 1527)  fentaNYL  (SUBLIMAZE ) injection 100 mcg (100 mcg Intravenous Given 02/25/24 1545)  ondansetron  (ZOFRAN ) injection 4 mg (4 mg Intravenous Given 02/25/24 1554)  ondansetron  (ZOFRAN ) 4 MG/2ML injection (  Given 02/25/24 1600)  iohexol  (OMNIPAQUE ) 350 MG/ML injection 80 mL (100 mLs Intravenous Contrast Given 02/25/24 1618)  metoprolol  tartrate (LOPRESSOR ) injection 5 mg (5 mg Intravenous Given 02/25/24 1717)  fentaNYL  (SUBLIMAZE ) injection 100 mcg (100 mcg Intravenous Given 02/25/24 1847)  sodium chloride  0.9 % bolus 500 mL (0 mLs Intravenous Stopped 02/25/24 2022)                                    Medical Decision Making Cardiac monitor interpretation: Sinus tachycardia in a flutter with block, rates in the 130s, no ectopy  Patient arrived diaphoretic, pale and tachycardic.  Likely in a flutter with 2 1  block.  He is very uncomfortable appearing and complaining of 2 out of 10 chest pain and pressure.  Was given fentanyl  aspirin  fluids and started on a Cardizem  drip after bolus did not change his heart rate at all.  CTA was done rapidly with no evidence of dissection.  Patient's troponin and BNP are elevated.  I  spoke with cardiology, Dr. Shlomo on-call and she recommend admission to the hospital service at [facility].  Eventually after another bolus of Lopressor  patient heart rate did convert to sinus rhythm and his symptoms did improve.  Discussed patient's case with hospitalist and patient will be admitted for further workup and management.  Patient is agreeable with the plan.  All results and plan discussed with patient and family at bedside.  Problems Addressed: Atrial flutter with rapid ventricular response (HCC): acute illness or injury that poses a threat to life or bodily functions Chest pain, unspecified type: acute illness or injury Nausea: acute illness or injury  Amount and/or Complexity of Data Reviewed Independent Historian: spouse    Details: Spouse at bedside helps provide history.  Provides history about patient's medical history including coronary artery disease with stenting External Data Reviewed: notes.    Details: Ordered and reviewed by me and patient with previous admission for A-fib with RVR in the past Labs: ordered. Decision-making details documented in ED Course.    Details: Ordered and reviewed by me and patient with elevated troponin and BNP as well as a leukocytosis Radiology: ordered and independent interpretation performed. Decision-making details documented in ED Course.    Details: Ordered and reviewed by me CT angiogram of the abdomen pelvis shows no evidence of dissection or acute abnormality ECG/medicine tests: ordered and independent interpretation performed. Decision-making details documented in ED Course.    Details: Ordered and interpreted by me in the absence  of cardiology and shows sinus tachycardia/A flutter with a 2-1 block, rate 130s and no STEMI, repeat EKG is unchanged Discussion of management or test interpretation with external provider(s): Dr. Turner-cardiology-spoke with her on the phone and she did not recommend any other further medication management but they will consult on the patient, recommended admission to [facility]  Hospitalist-I spoke with him on the phone regarding the patient's case and they will with the patient for further workup and management  Risk OTC drugs. Prescription drug management. Parenteral controlled substances. Drug therapy requiring intensive monitoring for toxicity. Decision regarding hospitalization. Risk Details: CRITICAL CARE Performed by: Duwaine LITTIE Fusi   Total critical care time: 55 minutes  Critical care time was exclusive of separately billable procedures and treating other patients.  Critical care was necessary to treat or prevent imminent or life-threatening deterioration.  Critical care was time spent personally by me on the following activities: development of treatment plan with patient and/or surrogate as well as nursing, discussions with consultants, evaluation of patient's response to treatment, examination of patient, obtaining history from patient or surrogate, ordering and performing treatments and interventions, ordering and review of laboratory studies, ordering and review of radiographic studies, pulse oximetry and re-evaluation of patient's condition.   Critical Care Total time providing critical care: 55 minutes     Final diagnoses:  Atrial flutter with rapid ventricular response (HCC)  Chest pain, unspecified type  Nausea    ED Discharge Orders     None          Fusi Duwaine LITTIE, DO 02/25/24 2042

## 2024-02-25 NOTE — Telephone Encounter (Signed)
 Report from [facility].

## 2024-02-25 NOTE — ED Triage Notes (Signed)
 Pt reports palpitations intermittent x 1 week, concerned for A-fib, hx of same, triage HR 131

## 2024-02-26 ENCOUNTER — Inpatient Hospital Stay (HOSPITAL_COMMUNITY)

## 2024-02-26 ENCOUNTER — Encounter (HOSPITAL_COMMUNITY)
Admission: EM | Disposition: A | Discharge status: Home / Self Care | Payer: Self-pay | Source: Home / Self Care | Attending: Internal Medicine

## 2024-02-26 ENCOUNTER — Observation Stay (HOSPITAL_COMMUNITY)

## 2024-02-26 ENCOUNTER — Encounter (HOSPITAL_COMMUNITY): Payer: Self-pay | Admitting: Cardiology

## 2024-02-26 DIAGNOSIS — N1832 Chronic kidney disease, stage 3b: Secondary | ICD-10-CM | POA: Diagnosis present

## 2024-02-26 DIAGNOSIS — I4892 Unspecified atrial flutter: Secondary | ICD-10-CM

## 2024-02-26 DIAGNOSIS — I110 Hypertensive heart disease with heart failure: Secondary | ICD-10-CM

## 2024-02-26 DIAGNOSIS — I5020 Unspecified systolic (congestive) heart failure: Secondary | ICD-10-CM | POA: Diagnosis not present

## 2024-02-26 DIAGNOSIS — I255 Ischemic cardiomyopathy: Secondary | ICD-10-CM | POA: Diagnosis present

## 2024-02-26 DIAGNOSIS — I472 Ventricular tachycardia, unspecified: Secondary | ICD-10-CM | POA: Diagnosis not present

## 2024-02-26 DIAGNOSIS — R0609 Other forms of dyspnea: Secondary | ICD-10-CM

## 2024-02-26 DIAGNOSIS — R079 Chest pain, unspecified: Secondary | ICD-10-CM | POA: Diagnosis present

## 2024-02-26 DIAGNOSIS — I4891 Unspecified atrial fibrillation: Secondary | ICD-10-CM

## 2024-02-26 DIAGNOSIS — Z87891 Personal history of nicotine dependence: Secondary | ICD-10-CM

## 2024-02-26 DIAGNOSIS — E7800 Pure hypercholesterolemia, unspecified: Secondary | ICD-10-CM | POA: Diagnosis present

## 2024-02-26 DIAGNOSIS — I350 Nonrheumatic aortic (valve) stenosis: Secondary | ICD-10-CM | POA: Diagnosis present

## 2024-02-26 DIAGNOSIS — I5033 Acute on chronic diastolic (congestive) heart failure: Secondary | ICD-10-CM

## 2024-02-26 DIAGNOSIS — J9601 Acute respiratory failure with hypoxia: Secondary | ICD-10-CM | POA: Diagnosis not present

## 2024-02-26 DIAGNOSIS — E1140 Type 2 diabetes mellitus with diabetic neuropathy, unspecified: Secondary | ICD-10-CM | POA: Diagnosis present

## 2024-02-26 DIAGNOSIS — Z7951 Long term (current) use of inhaled steroids: Secondary | ICD-10-CM | POA: Diagnosis not present

## 2024-02-26 DIAGNOSIS — R110 Nausea: Secondary | ICD-10-CM | POA: Diagnosis not present

## 2024-02-26 DIAGNOSIS — E66812 Obesity, class 2: Secondary | ICD-10-CM | POA: Diagnosis present

## 2024-02-26 DIAGNOSIS — I5043 Acute on chronic combined systolic (congestive) and diastolic (congestive) heart failure: Secondary | ICD-10-CM | POA: Diagnosis not present

## 2024-02-26 DIAGNOSIS — J441 Chronic obstructive pulmonary disease with (acute) exacerbation: Secondary | ICD-10-CM | POA: Diagnosis present

## 2024-02-26 DIAGNOSIS — F319 Bipolar disorder, unspecified: Secondary | ICD-10-CM | POA: Diagnosis present

## 2024-02-26 DIAGNOSIS — I480 Paroxysmal atrial fibrillation: Secondary | ICD-10-CM

## 2024-02-26 DIAGNOSIS — J9690 Respiratory failure, unspecified, unspecified whether with hypoxia or hypercapnia: Secondary | ICD-10-CM | POA: Diagnosis not present

## 2024-02-26 DIAGNOSIS — E1122 Type 2 diabetes mellitus with diabetic chronic kidney disease: Secondary | ICD-10-CM | POA: Diagnosis present

## 2024-02-26 DIAGNOSIS — R000 Tachycardia, unspecified: Secondary | ICD-10-CM | POA: Diagnosis not present

## 2024-02-26 DIAGNOSIS — J440 Chronic obstructive pulmonary disease with acute lower respiratory infection: Secondary | ICD-10-CM | POA: Diagnosis present

## 2024-02-26 DIAGNOSIS — L8990 Pressure ulcer of unspecified site, unspecified stage: Secondary | ICD-10-CM | POA: Diagnosis present

## 2024-02-26 DIAGNOSIS — N179 Acute kidney failure, unspecified: Secondary | ICD-10-CM | POA: Diagnosis present

## 2024-02-26 DIAGNOSIS — I2510 Atherosclerotic heart disease of native coronary artery without angina pectoris: Secondary | ICD-10-CM | POA: Diagnosis present

## 2024-02-26 DIAGNOSIS — I4819 Other persistent atrial fibrillation: Secondary | ICD-10-CM | POA: Diagnosis present

## 2024-02-26 DIAGNOSIS — Z794 Long term (current) use of insulin: Secondary | ICD-10-CM | POA: Diagnosis not present

## 2024-02-26 DIAGNOSIS — I130 Hypertensive heart and chronic kidney disease with heart failure and stage 1 through stage 4 chronic kidney disease, or unspecified chronic kidney disease: Secondary | ICD-10-CM | POA: Diagnosis present

## 2024-02-26 DIAGNOSIS — D631 Anemia in chronic kidney disease: Secondary | ICD-10-CM | POA: Diagnosis present

## 2024-02-26 DIAGNOSIS — E1165 Type 2 diabetes mellitus with hyperglycemia: Secondary | ICD-10-CM | POA: Diagnosis present

## 2024-02-26 DIAGNOSIS — E1151 Type 2 diabetes mellitus with diabetic peripheral angiopathy without gangrene: Secondary | ICD-10-CM | POA: Diagnosis present

## 2024-02-26 DIAGNOSIS — Z7901 Long term (current) use of anticoagulants: Secondary | ICD-10-CM | POA: Diagnosis not present

## 2024-02-26 HISTORY — PX: CARDIOVERSION: EP1203

## 2024-02-26 LAB — CBC
HCT: 32.3 % — ABNORMAL LOW (ref 39.0–52.0)
Hemoglobin: 9.4 g/dL — ABNORMAL LOW (ref 13.0–17.0)
MCH: 21.9 pg — ABNORMAL LOW (ref 26.0–34.0)
MCHC: 29.1 g/dL — ABNORMAL LOW (ref 30.0–36.0)
MCV: 75.1 fL — ABNORMAL LOW (ref 80.0–100.0)
Platelets: 331 K/uL (ref 150–400)
RBC: 4.3 MIL/uL (ref 4.22–5.81)
RDW: 17.4 % — ABNORMAL HIGH (ref 11.5–15.5)
WBC: 12.2 K/uL — ABNORMAL HIGH (ref 4.0–10.5)
nRBC: 0 % (ref 0.0–0.2)

## 2024-02-26 LAB — COMPREHENSIVE METABOLIC PANEL WITH GFR
ALT: 20 U/L (ref 0–44)
AST: 18 U/L (ref 15–41)
Albumin: 3 g/dL — ABNORMAL LOW (ref 3.5–5.0)
Alkaline Phosphatase: 82 U/L (ref 38–126)
Anion gap: 12 (ref 5–15)
BUN: 23 mg/dL (ref 8–23)
CO2: 21 mmol/L — ABNORMAL LOW (ref 22–32)
Calcium: 7.9 mg/dL — ABNORMAL LOW (ref 8.9–10.3)
Chloride: 107 mmol/L (ref 98–111)
Creatinine, Ser: 1.47 mg/dL — ABNORMAL HIGH (ref 0.61–1.24)
GFR, Estimated: 49 mL/min — ABNORMAL LOW (ref >60)
Glucose, Bld: 105 mg/dL — ABNORMAL HIGH (ref 70–99)
Potassium: 3.5 mmol/L (ref 3.5–5.1)
Sodium: 140 mmol/L (ref 135–145)
Total Bilirubin: 0.7 mg/dL (ref 0.0–1.2)
Total Protein: 6.3 g/dL — ABNORMAL LOW (ref 6.5–8.1)

## 2024-02-26 LAB — ECHOCARDIOGRAM COMPLETE
AR max vel: 1.52 cm2
AV Area VTI: 1.39 cm2
AV Area mean vel: 1.39 cm2
AV Mean grad: 18.6 mmHg
AV Peak grad: 27.5 mmHg
Ao pk vel: 2.62 m/s
Area-P 1/2: 4.7 cm2
Calc EF: 51.6 %
Height: 70 in
S' Lateral: 4.6 cm
Single Plane A2C EF: 64.2 %
Single Plane A4C EF: 36.4 %
Weight: 4402.15 [oz_av]

## 2024-02-26 LAB — GLUCOSE, CAPILLARY
Glucose-Capillary: 93 mg/dL (ref 70–99)
Glucose-Capillary: 234 mg/dL — ABNORMAL HIGH (ref 70–99)
Glucose-Capillary: 103 mg/dL — ABNORMAL HIGH (ref 70–99)
Glucose-Capillary: 203 mg/dL — ABNORMAL HIGH (ref 70–99)

## 2024-02-26 LAB — TSH: TSH: 1.871 u[IU]/mL (ref 0.350–4.500)

## 2024-02-26 LAB — TROPONIN I (HIGH SENSITIVITY): Troponin I (High Sensitivity): 14 ng/L (ref <18)

## 2024-02-26 SURGERY — CARDIOVERSION (CATH LAB)
Anesthesia: General

## 2024-02-26 MED ORDER — LIDOCAINE 2% (20 MG/ML) 5 ML SYRINGE
INTRAMUSCULAR | Status: DC | PRN
  Administered 2024-02-26: 80 mg via INTRAVENOUS

## 2024-02-26 MED ORDER — ALBUMIN HUMAN 25 % IV SOLN
12.5000 g | Freq: Once | INTRAVENOUS | Status: AC
  Administered 2024-02-26: 12.5 g via INTRAVENOUS
  Filled 2024-02-26: qty 50

## 2024-02-26 MED ORDER — PHENYLEPHRINE HCL (PRESSORS) 10 MG/ML IV SOLN
INTRAVENOUS | Status: DC | PRN
  Administered 2024-02-26: 160 ug via INTRAVENOUS

## 2024-02-26 MED ORDER — FENTANYL CITRATE PF 50 MCG/ML IJ SOSY
12.5000 ug | PREFILLED_SYRINGE | Freq: Once | INTRAMUSCULAR | Status: AC
  Administered 2024-02-26: 12.5 ug via INTRAVENOUS
  Filled 2024-02-26: qty 1

## 2024-02-26 MED ORDER — SODIUM CHLORIDE 0.9 % IV SOLN: 250.0000 mL | INTRAVENOUS | Status: DC | PRN

## 2024-02-26 MED ORDER — RIVAROXABAN 20 MG PO TABS
20.0000 mg | ORAL_TABLET | Freq: Every day | ORAL | Status: DC
  Administered 2024-02-26 – 2024-02-28 (×3): 20 mg via ORAL
  Filled 2024-02-26 (×3): qty 1

## 2024-02-26 MED ORDER — SODIUM CHLORIDE 0.9% FLUSH
INTRAVENOUS | Status: DC | PRN
Start: 2024-02-26 — End: 2024-02-26
  Administered 2024-02-26: 10 mL via INTRAVENOUS

## 2024-02-26 MED ORDER — ACETAMINOPHEN 10 MG/ML IV SOLN
1000.0000 mg | Freq: Once | INTRAVENOUS | Status: AC
  Administered 2024-02-26: 1000 mg via INTRAVENOUS
  Filled 2024-02-26: qty 100

## 2024-02-26 MED ORDER — SODIUM CHLORIDE 0.9% FLUSH: 3.0000 mL | INTRAVENOUS | Status: DC | PRN

## 2024-02-26 MED ORDER — PROPOFOL 10 MG/ML IV BOLUS
INTRAVENOUS | Status: DC | PRN
  Administered 2024-02-26: 70 mg via INTRAVENOUS

## 2024-02-26 MED ORDER — ALUM & MAG HYDROXIDE-SIMETH 200-200-20 MG/5ML PO SUSP
30.0000 mL | Freq: Once | ORAL | Status: AC
  Administered 2024-02-26: 30 mL via ORAL
  Filled 2024-02-26: qty 30

## 2024-02-26 MED ORDER — INSULIN ASPART 100 UNIT/ML IJ SOLN
0.0000 [IU] | Freq: Three times a day (TID) | INTRAMUSCULAR | Status: DC
  Administered 2024-02-26: 2 [IU] via SUBCUTANEOUS

## 2024-02-26 MED ORDER — PERFLUTREN LIPID MICROSPHERE
1.0000 mL | INTRAVENOUS | Status: AC | PRN
  Administered 2024-02-26: 2 mL via INTRAVENOUS

## 2024-02-26 MED ORDER — SODIUM CHLORIDE 0.9% FLUSH: 3.0000 mL | Freq: Two times a day (BID) | INTRAVENOUS | Status: DC

## 2024-02-26 MED ORDER — ONDANSETRON HCL 4 MG/2ML IJ SOLN
4.0000 mg | Freq: Four times a day (QID) | INTRAMUSCULAR | Status: DC | PRN
  Administered 2024-02-27: 4 mg via INTRAVENOUS
  Filled 2024-02-26: qty 2

## 2024-02-26 MED ORDER — LEVALBUTEROL HCL 0.63 MG/3ML IN NEBU
0.6300 mg | INHALATION_SOLUTION | Freq: Four times a day (QID) | RESPIRATORY_TRACT | Status: DC | PRN
  Administered 2024-02-26: 0.63 mg via RESPIRATORY_TRACT
  Filled 2024-02-26: qty 3

## 2024-02-26 MED ORDER — ONDANSETRON HCL 4 MG PO TABS: 4.0000 mg | ORAL_TABLET | Freq: Four times a day (QID) | ORAL | Status: DC | PRN

## 2024-02-26 MED ORDER — CLOPIDOGREL BISULFATE 75 MG PO TABS
75.0000 mg | ORAL_TABLET | Freq: Every day | ORAL | Status: DC
  Administered 2024-02-26 – 2024-03-09 (×13): 75 mg via ORAL
  Filled 2024-02-26 (×13): qty 1

## 2024-02-26 SURGICAL SUPPLY — 1 items: PAD DEFIB RADIO PHYSIO CONN (PAD) ×1 IMPLANT

## 2024-02-26 NOTE — Interval H&P Note (Signed)
 History and Physical Interval Note:  02/26/2024 3:31 PM  Mike Hunter  has presented today for surgery, with the diagnosis of aflutter.  The various methods of treatment have been discussed with the patient and family. After consideration of risks, benefits and other options for treatment, the patient has consented to  Procedure(s): CARDIOVERSION (N/A) as a surgical intervention.  The patient's history has been reviewed, patient examined, no change in status, stable for surgery.  I have reviewed the patient's chart and labs.  Questions were answered to the patient's satisfaction.     Margalit Leece Lonni

## 2024-02-26 NOTE — Progress Notes (Signed)
 Mobility Specialist Progress Note:    02/26/24 1020  Mobility  Activity Stood at bedside;Ambulated with assistance  Level of Assistance Contact guard assist, steadying assist  Assistive Device Other (Comment) (HHA)  Distance Ambulated (ft) 10 ft  Range of Motion/Exercises Left leg;Right leg (x5 fwd,bkw, side to side steps)  Activity Response Tolerated fair  Mobility Referral Yes  Mobility visit 1 Mobility  Mobility Specialist Start Time (ACUTE ONLY) 1020  Mobility Specialist Stop Time (ACUTE ONLY) 1100  Mobility Specialist Time Calculation (min) (ACUTE ONLY) 40 min   Pt resting in bed, agreeable to try a session despite not feeling as well. Pt needed a moment to sit on EOB d/t dizziness from moving too fast. Pt able to stand w/ HHA but slightly unstable w/ balance in the beginning. Pt able to take side steps to the left and right, fwd, and bkw steps before needing to sit down from fatigue. Pt returned to bed w/ all needs met.   Mike Hunter Mobility Specialist Please Neurosurgeon or Rehab Office at [phone]

## 2024-02-26 NOTE — Progress Notes (Addendum)
 Clinic Appointment  HN called to schedule hospital follow up at [facility] clinic- [id number].  HN spoke to wife who had another call coming in- HN will try again.  Wife returned call- appointment scheduled with Dr. Otho 03/11/24 @ 1320.  Patient discharging to SNF- appointment cancelled.

## 2024-02-26 NOTE — CV Procedure (Signed)
 Procedure:   DCCV  Indication:  Symptomatic atrial fibrillation  Procedure Note:  The patient signed informed consent.  They have had had therapeutic anticoagulation with rivaroxaban  greater than 3 weeks.  Anesthesia was administered by Dr. Darlyn.  Patient received 80 mg IV lidocaine  and 70 mg IV propofol .Adequate airway was maintained throughout and vital followed per protocol.  They were cardioverted x 1 with 200J of biphasic synchronized energy.  They converted to NSR.  There were no apparent complications.  The patient had normal neuro status and respiratory status post procedure with vitals stable as recorded elsewhere.    Follow up:  They will continue on current medical therapy and follow up with cardiology as scheduled.  Shelda Bruckner, MD PhD 02/26/2024 3:55 PM

## 2024-02-26 NOTE — Progress Notes (Signed)
 [facility]                                                                              Mike Hunter, is a 73 y.o. male, DOB - [birth year], FMW:[id number] Admit date - 02/25/2024    Outpatient Primary MD for the patient is Mike Hunter, [name], MD  LOS - 0  days  Chief Complaint  Patient presents with   Palpitations   Chest Pain       Brief summary   Patient is a 73 year old male with paroxysmal atrial fibrillation on Xarelto , chronic HFpEF, COPD, CKD stage IIIb diabetes mellitus type 2, HTN, osteoporosis, CAD status post PCI, obesity, OSA on CPAP presented to ED with palpitations for 1 week.  He also reported chest pain, pressure with radiation down the left arm, associated shortness of breath, prior to presenting to ED, heart rate was in 130s In ED, patient was found to have heart rate 131, BP 105/53 and was placed on Cardizem  drip cardiology was consulted.  Assessment & Plan    Principal Problem:   Atrial fibrillation with rapid ventricular response (HCC) - HR controlled on Cardizem  drip, cardiology consulted - Torsemide , Aldactone , metoprolol , Imdur  currently held due to borderline BP  - 2D echo pending - Cardiology following, plan for cardioversion    Active Problems: CAD status post PCI, HFpEF, hypertension -Previous echo 12/2023 had shown EF of 55 to 60%, indeterminate left ventricular diastolic parameters, global hypokinesis, moderate asymmetric LVH of the septal segment.  Mild to moderate aortic stenosis - Torsemide , Aldactone , metoprolol , Imdur  currently held due to borderline BP  - Cardiology following - Resume Plavix , Xarelto  per cardiology recommendation     Insulin -requiring or dependent type II diabetes mellitus (HCC) -On high doses of insulin  outpatient, last A1c 6.7 -Currently only on sliding scale insulin  while inpatient, follow CBGs closely CBG (last 3)  Recent Labs    02/26/24 0709  GLUCAP 93       Anemia due to stage 3b chronic  kidney disease (HCC) -H&H currently stable.  Baseline hemoglobin 9-10  CKD stage IIIb -Creatinine currently stable  Hyperlipidemia - Resume statin   Obesity class II Estimated body mass index is 39.48 kg/m as calculated from the following:   Height as of this encounter: 5' 10 (1.778 m).   Weight as of this encounter: 124.8 kg.  Code Status: Full code DVT Prophylaxis:     Level of Care: Level of care: Progressive Family Communication: Updated patient' Disposition Plan:      Remains inpatient appropriate:      Procedures:    Consultants:   Cardiology  Antimicrobials:   Anti-infectives (From admission, onward)    None          Medications  insulin  aspart  0-6 Units Subcutaneous TID WC      Subjective:   Alm Hunter was seen and examined today.  No acute complaints, per patient, cardiology planning cardioversion today.  No acute chest pain, nausea vomiting, fevers or chills.  On Cardizem  drip  Objective:   Vitals:   02/26/24 0015 02/26/24 0219 02/26/24 0357 02/26/24 0736  BP: 110/81  114/67 100/63  Pulse: 67  69   Resp: 14 19 20 18   Temp: 98.8 F (37.1 C)  98.8 F (37.1 C) 97.8 F (36.6 C)  TempSrc: Oral  Oral Oral  SpO2: 98%  100% 100%  Weight: 124.8 kg     Height: 5' 10 (1.778 m)       Intake/Output Summary (Last 24 hours) at 02/26/2024 1036 Last data filed at 02/26/2024 0400 Gross per 24 hour  Intake 616.88 ml  Output --  Net 616.88 ml     Wt Readings from Last 3 Encounters:  02/26/24 124.8 kg  01/05/24 117.9 kg  08/26/23 116 kg     Exam General: Alert and oriented x 3, NAD Cardiovascular: Irregular, systolic ejection murmur Respiratory: Clear to auscultation bilaterally, no wheezing Gastrointestinal: Soft, nontender, nondistended, + bowel sounds Ext: no pedal edema bilaterally Neuro: No new deficits Psych: Normal affect, pleasant    Data Reviewed:  I have personally reviewed following labs    CBC Lab Results   Component Value Date   WBC 12.2 (H) 02/26/2024   RBC 4.30 02/26/2024   HGB 9.4 (L) 02/26/2024   HCT 32.3 (L) 02/26/2024   MCV 75.1 (L) 02/26/2024   MCH 21.9 (L) 02/26/2024   PLT 331 02/26/2024   MCHC 29.1 (L) 02/26/2024   RDW 17.4 (H) 02/26/2024   LYMPHSABS 1.1 08/25/2023   MONOABS 0.8 08/25/2023   EOSABS 0.2 08/25/2023   BASOSABS 0.1 08/25/2023     Last metabolic panel Lab Results  Component Value Date   NA 140 02/26/2024   K 3.5 02/26/2024   CL 107 02/26/2024   CO2 21 (L) 02/26/2024   BUN 23 02/26/2024   CREATININE 1.47 (H) 02/26/2024   GLUCOSE 105 (H) 02/26/2024   GFRNONAA 49 (L) 02/26/2024   GFRAA 53 (L) 09/13/2019   CALCIUM  7.9 (L) 02/26/2024   PHOS 4.5 12/30/2023   PROT 6.3 (L) 02/26/2024   ALBUMIN  3.0 (L) 02/26/2024   BILITOT 0.7 02/26/2024   ALKPHOS 82 02/26/2024   AST 18 02/26/2024   ALT 20 02/26/2024   ANIONGAP 12 02/26/2024    CBG (last 3)  Recent Labs    02/26/24 0709  GLUCAP 93      Coagulation Profile: No results for input(s): INR, PROTIME in the last 168 hours.   Radiology Studies: I have personally reviewed the imaging studies  CT Angio Chest/Abd/Pel for Dissection W and/or Wo Contrast Result Date: 02/25/2024 CLINICAL DATA:  Evaluate for dissection. EXAM: CT ANGIOGRAPHY CHEST, ABDOMEN AND PELVIS TECHNIQUE: Noncontrast chest CT was performed. Multidetector CT imaging through the chest, abdomen and pelvis was performed using the standard protocol during bolus administration of intravenous contrast. Multiplanar reconstructed images and MIPs were obtained and reviewed to evaluate the vascular anatomy. RADIATION DOSE REDUCTION: This exam was performed according to the departmental dose-optimization program which includes automated exposure control, adjustment of the mA and/or kV according to patient size and/or use of iterative reconstruction technique. CONTRAST:  OMNIPAQUE  IOHEXOL  350 MG/ML SOLN COMPARISON:  CT PE protocol 08/24/2023. CT  chest abdomen and pelvis 01/30/2023. CT chest 01/26/2020. FINDINGS: CTA CHEST FINDINGS Cardiovascular: Aorta is normal in size. There is no evidence for aortic dissection or aneurysm. Origin of the great vessels appears patent. Aberrant right subclavian artery present. There are mild calcified atherosclerotic plaque throughout the aorta. The heart is mildly enlarged. There is no pericardial effusion. There are atherosclerotic calcifications of the coronary arteries. Mediastinum/Nodes: No enlarged mediastinal, hilar, or axillary lymph nodes. Thyroid gland, trachea, and  esophagus demonstrate no significant findings. Lungs/Pleura: Right lower lobe nodular density peripherally measures 7 mm image 6/87, unchanged. There is also stable right lower lobe nodule image 6/81 measuring 5 mm. These are unchanged from 2021 and favored as benign. The lungs are otherwise clear. There is no pleural effusion or pneumothorax. Musculoskeletal: Cervical spinal fusion plate is present. There is T10 compression fracture which appears acute or subacute and has progressed compared to the prior study. Review of the MIP images confirms the above findings. CTA ABDOMEN AND PELVIS FINDINGS VASCULAR Aorta: Normal caliber aorta without aneurysm, dissection, vasculitis or significant stenosis. Mild calcified atherosclerotic disease present. Celiac: Patent without evidence of aneurysm, dissection, vasculitis or significant stenosis. SMA: Patent without evidence of aneurysm, dissection, vasculitis or significant stenosis. Renals: Both renal arteries are patent without evidence of aneurysm, dissection, vasculitis, fibromuscular dysplasia or significant stenosis. IMA: Patent without evidence of aneurysm, dissection, vasculitis or significant stenosis. Inflow: Patent without evidence of aneurysm, dissection, vasculitis or significant stenosis. Veins: No obvious venous abnormality within the limitations of this arterial phase study. Review of the MIP  images confirms the above findings. NON-VASCULAR Hepatobiliary: No focal liver abnormality is seen. No gallstones, gallbladder wall thickening, or biliary dilatation. Pancreas: Unremarkable. No pancreatic ductal dilatation or surrounding inflammatory changes. Spleen: Normal in size without focal abnormality. Adrenals/Urinary Tract: Bilateral renal cysts are present. The largest is in the superior pole of the left kidney measuring 3.3 cm. There is no hydronephrosis or perinephric fluid. Adrenal glands and bladder are within normal limits. Stomach/Bowel: Stomach is within normal limits. Appendix appears normal. No evidence of bowel wall thickening, distention, or inflammatory changes. Lymphatic: No enlarged lymph nodes are seen. Reproductive: TURP defect again noted. Other: There is a small fat containing umbilical hernia. There is no ascites. Musculoskeletal: Prominent Schmorl's node of L3 is unchanged. Review of the MIP images confirms the above findings. IMPRESSION: 1. No evidence for aortic dissection or aneurysm. 2. No acute cardiopulmonary process. 3. T10 compression fracture has progressed compared to the prior study. 4. No acute localizing process in the abdomen or pelvis. Aortic Atherosclerosis (ICD10-I70.0). Electronically Signed   By: Greig Pique M.D.   On: 02/25/2024 16:42       Jhostin Epps M.D. [facility] 02/26/2024, 10:36 AM  Available via Epic secure chat 7am-7pm After 7 pm, please refer to night coverage provider listed on amion.

## 2024-02-26 NOTE — Progress Notes (Signed)
 Patient has arrived from [facility]. Alert and oriented. Vitals obtained; telemetry placed - afib; on cardizem  gtt. Admitting provider notified of arrival.

## 2024-02-26 NOTE — Progress Notes (Signed)
 Patient seen this AM by overnight cardiology.  He reports that he always feels terrible in afib. He follows with Dr. Debora at [facility]. He previously had a cardioversion, which held for about a month. They have discussed ablation in the past. Has not been on antiarrhythmics that he knows of, but on chart review he appears to have been on amiodarone  (stopped 12/10/23), unclear why.  We discussed options today. He is on afib and rate controlled on diltiazem  drip but still feels poorly. He did have a light breakfast this morning, finished around 7:45 AM.  We discussed options. Will aim for cardioversion today. I discussed with cath holding staff and Dr. Garlan to be NPO for 8 hours, so earliest procedure can be done is 3:45.   I discussed this with him, and he is amenable. No missed doses of Xarelto . Has OSA on CPAP, COPD, CAD with stents 10/2023, type II diabetes on insulin , moderate aortic stenosis, carotid stenosis s/p stent as risk factors.   From a cardiac standpoint, if he converts this afternoon he could be discharged later today. Will need to schedule follow up with his cardiologist Dr. Debora to determine long term strategy for his afib.  Informed Consent   Shared Decision Making/Informed Consent{ The risks (stroke, cardiac arrhythmias rarely resulting in the need for a temporary or permanent pacemaker, skin irritation or burns and complications associated with conscious sedation including aspiration, arrhythmia, respiratory failure and death), benefits (restoration of normal sinus rhythm) and alternatives of a direct current cardioversion were explained in detail to Mr. Dixson and he agrees to proceed.   Shelda Bruckner, MD, PhD, [facility] [place]  [facility]  [facility] at [facility] [address] [place], [place] [address] [phone]

## 2024-02-26 NOTE — H&P (Addendum)
 History and Physical    [name]:[id number] DOB: 1950-10-03 DOA: 02/25/2024  PCP: [name], [name], MD  Patient coming from: home  I have personally briefly reviewed patient's old medical records in [facility]  Chief Complaint: palpitations x 1 week  HPI: Mike Hunter is a 73 y.o. male with medical history significant of paroxysmal A-fib on Xarelto , chronic HFpEF, COPD, CKD 3B, type 2 diabetes, hypertension, osteoporosis on teriparatide , coronary artery disease status post PCI with stent placement, obesity, OSA on CPAP, who presents to ED with complaint of palpitations x 1 week. Patient notes prior to presenting to ED hr in 130's and he also noted associated sob as well as chest pressure with radiation down left arm. Patient notes no associated f/c/n/v/d or abdominal pain.   ED Course:  Patient in ED noted to be in afib with rvr. Patient placed on cardizem  drip Case was discussed with cardiology.  Patient admitted for further cardiac tx and evaluation.   Vitals : afeb, bp 105/53, hr 131, rr 18 sat 100%   ZXH:jqpa rvr RAD, Labs Wbc 11.4, hgb 10.2 (9.7),plt 388 Na 140,K 3.7, CL101, glu 203, cr 1.55 CE 31,31 CTA chest/abd/pelvis IMPRESSION: 1. No evidence for aortic dissection or aneurysm. 2. No acute cardiopulmonary process. 3. T10 compression fracture has progressed compared to the prior study. 4. No acute localizing process in the abdomen or pelvis. Trop 31 BNP 2137  Tx asa 324, fentanyl  , ns 500cc, cardizem  10mg  iv x 1 ,cardizem  drip   Review of Systems: As per HPI otherwise 10 point review of systems negative.   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 He has never used smokeless tobacco. He reports that he does not drink alcohol and does not use drugs.  Allergies  Allergen Reactions   Buprenorphine Hcl Nausea And Vomiting    Other reaction(s): Nausea And Vomiting    Hydromorphone  Other (See Comments)    Other reaction(s): Mental Status Changes (intolerance) Other reaction(s): HALLUCINATIONS hallucinations hallucinations hallucinations hallucinations    Codeine Nausea And Vomiting   Mirtazapine     Other reaction(s): Other (See Comments) Nightmares & altered mental status    Morphine  And Codeine Nausea And Vomiting    No family history on file.  Prior to Admission medications   Medication Sig Start Date End Date Taking? Authorizing Provider  albuterol  (PROVENTIL  HFA;VENTOLIN  HFA) 108 (90 BASE) MCG/ACT inhaler Inhale 2 puffs into the lungs every 4 (four) hours as needed for wheezing.    [provider]  atorvastatin  (LIPITOR ) 80  MG tablet Take 1 tablet (80 mg total) by mouth daily. 01/06/24 02/05/24  [name], [name], MD  budesonide  (PULMICORT ) 0.5 MG/2ML nebulizer solution Take 2 mLs by nebulization 2 (two) times daily.    [provider]  budesonide -formoterol  (SYMBICORT) 160-4.5 MCG/ACT inhaler Inhale 2 puffs into the lungs 2 (two) times daily. 07/03/16   [provider]  clopidogrel  (PLAVIX ) 75 MG tablet Take 1 tablet by mouth daily. 05/18/23 05/17/24  [provider]  dextromethorphan -guaiFENesin  (MUCINEX  DM) 30-600 MG 12hr tablet Take 1 tablet by  mouth 2 (two) times daily. Patient taking differently: Take 1 tablet by mouth daily as needed for cough. 06/10/23   Cheryle Page, MD  donepezil  (ARICEPT ) 10 MG tablet Take 10 mg by mouth at bedtime.    [provider]  insulin  aspart (NOVOLOG  FLEXPEN) 100 UNIT/ML injection Inject 26-44 Units into the skin See admin instructions. 5 units prior to meals for cbg>= 150  Sliding scale (base is 26u in the AM, 30u at noon, and  36-44 at night    [provider]  Insulin  Degludec (TRESIBA  FLEXTOUCH [place]) Inject 40-50 Units into the skin See admin instructions. 40 morning, 50 night    [provider]  isosorbide  mononitrate (IMDUR ) 30 MG 24 hr tablet Take 1 tablet by mouth daily. 01/22/22   [provider]  latanoprost  (XALATAN ) 0.005 % ophthalmic solution Place 1 drop into both eyes at bedtime.      [provider]  memantine  (NAMENDA ) 10 MG tablet Take 10 mg by mouth 2 (two) times daily.    [provider]  metoprolol  tartrate (LOPRESSOR ) 25 MG tablet Take 0.5 tablets (12.5 mg total) by mouth 2 (two) times daily. 01/05/24   [name], [name], MD  montelukast  (SINGULAIR ) 10 MG tablet Take 10 mg by mouth at bedtime.    [provider]  Multiple Vitamin (MULTIVITAMIN WITH MINERALS) TABS tablet Take 1 tablet by mouth daily.    [provider]  Multiple Vitamins-Minerals (PRESERVISION AREDS PO) Take 1 tablet by mouth daily.    [provider]  omeprazole (PRILOSEC) 40 MG capsule Take 1 capsule by mouth at bedtime. 02/02/17   [provider]  oxybutynin  (DITROPAN -XL) 10 MG 24 hr tablet Take 10 mg by mouth daily.    [provider]  sertraline  (ZOLOFT ) 100 MG tablet Take 100 mg by mouth daily.    [provider]  spironolactone  (ALDACTONE ) 25 MG tablet Take 25 mg by mouth daily.    [provider]  Suvorexant (BELSOMRA) 15 MG TABS Take 1 tablet by mouth at bedtime.    [provider]   Teriparatide  620 MCG/2.48ML SOPN Inject 2.48 mLs into the skin See admin instructions. Give 2.48 ml subcutaneous 6 times a day per patient 11/26/23   [provider]  torsemide  (DEMADEX ) 20 MG tablet Take 1 tablet (20 mg total) by mouth See admin instructions. 01/05/24 02/04/24  [name], [name], MD  WIXELA INHUB 250-50 MCG/ACT AEPB Inhale 1 puff into the lungs in the morning and at bedtime. 04/20/23   [provider]  XARELTO  20 MG TABS tablet Take 20 mg by mouth daily with supper. 04/04/22   [provider]    Physical Exam: Vitals:   02/25/24 2100 02/25/24 2305 02/25/24 2306 02/26/24 0015  BP: 100/68 121/87  110/81  Pulse: 89 78  67  Resp: 20   14  Temp:   98 F (36.7 C) 98.8 F (37.1 C)  TempSrc:  Oral Oral  SpO2: 100% 98%  98%  Weight:    124.8 kg  Height:    5' 10 (1.778 m)    Constitutional: NAD, calm, comfortable Vitals:   02/25/24 2100 02/25/24 2305 02/25/24 2306 02/26/24 0015  BP: 100/68 121/87  110/81  Pulse: 89 78  67  Resp: 20   14  Temp:   98 F (36.7 C) 98.8 F (37.1 C)  TempSrc:   Oral Oral  SpO2: 100% 98%  98%  Weight:    124.8 kg  Height:    5' 10 (1.778 m)   Eyes: PERRL, lids and conjunctivae normal ENMT: Mucous membranes are moist. Posterior pharynx clear of any exudate or lesions.Normal dentition.  Neck: normal, supple, no masses, no thyromegaly Respiratory: clear to auscultation bilaterally, no wheezing, no crackles. Normal respiratory effort. No accessory muscle use.  Cardiovascular: irregular and rhythm, no murmurs / rubs / gallops. No extremity edema. 2+ pedal pulses.  Abdomen: no tenderness, no masses palpated. No hepatosplenomegaly. Bowel sounds positive.  Musculoskeletal: no clubbing / cyanosis. No joint deformity upper and lower extremities. Good ROM, no contractures. Normal muscle tone.  Skin: no rashes, lesions, ulcers. No induration Neurologic:  grossly intact. Sensation intact, DTR normal. Strength 5/5 in all  4.  Psychiatric: Normal judgment and insight. Alert and oriented x 3. Normal mood.    Labs on Admission: I have personally reviewed following labs and imaging studies  CBC: Recent Labs  Lab 02/25/24 1518  WBC 11.4*  HGB 10.2*  HCT 34.7*  MCV 74.0*  PLT 388   Basic Metabolic Panel: Recent Labs  Lab 02/25/24 1518  NA 140  K 3.7  CL 101  CO2 25  GLUCOSE 203*  BUN 27*  CREATININE 1.55*  CALCIUM  8.4*   GFR: Estimated Creatinine Clearance: 54.6 mL/min (A) (by C-G formula based on SCr of 1.55 mg/dL (H)). Liver Function Tests: Recent Labs  Lab 02/25/24 1558  AST 20  ALT 22  ALKPHOS 113  BILITOT 0.4  PROT 7.3  ALBUMIN  4.1   Recent Labs  Lab 02/25/24 1558  LIPASE 35   No results for input(s): AMMONIA in the last 168 hours. Coagulation Profile: No results for input(s): INR, PROTIME in the last 168 hours. Cardiac Enzymes: No results for input(s): CKTOTAL, CKMB, CKMBINDEX, TROPONINI in the last 168 hours. BNP (last 3 results) Recent Labs    12/29/23 1232 02/25/24 1520  PROBNP 3,395.0* 2,137.0*   HbA1C: No results for input(s): HGBA1C in the last 72 hours. CBG: No results for input(s): GLUCAP in the last 168 hours. Lipid Profile: No results for input(s): CHOL, HDL, LDLCALC, TRIG, CHOLHDL, LDLDIRECT in the last 72 hours. Thyroid Function Tests: No results for input(s): TSH, T4TOTAL, FREET4, T3FREE, THYROIDAB in the last 72 hours. Anemia Panel: No results for input(s): VITAMINB12, FOLATE, FERRITIN, TIBC, IRON, RETICCTPCT in the last 72 hours. Urine analysis:    Component Value Date/Time   COLORURINE YELLOW 02/25/2024 1840   APPEARANCEUR CLEAR 02/25/2024 1840   LABSPEC <=1.005 02/25/2024 1840   PHURINE 5.5 02/25/2024 1840   GLUCOSEU >=500 (A) 02/25/2024 1840   HGBUR NEGATIVE 02/25/2024 1840   BILIRUBINUR NEGATIVE 02/25/2024 1840   KETONESUR NEGATIVE 02/25/2024 1840   PROTEINUR NEGATIVE 02/25/2024 1840    UROBILINOGEN 0.2 11/01/2014 1530   NITRITE NEGATIVE 02/25/2024 1840   LEUKOCYTESUR NEGATIVE 02/25/2024 1840    Radiological Exams on Admission: CT Angio Chest/Abd/Pel for Dissection W and/or Wo Contrast Result Date: 02/25/2024 CLINICAL DATA:  Evaluate for dissection. EXAM:  CT ANGIOGRAPHY CHEST, ABDOMEN AND PELVIS TECHNIQUE: Noncontrast chest CT was performed. Multidetector CT imaging through the chest, abdomen and pelvis was performed using the standard protocol during bolus administration of intravenous contrast. Multiplanar reconstructed images and MIPs were obtained and reviewed to evaluate the vascular anatomy. RADIATION DOSE REDUCTION: This exam was performed according to the departmental dose-optimization program which includes automated exposure control, adjustment of the mA and/or kV according to patient size and/or use of iterative reconstruction technique. CONTRAST:  OMNIPAQUE  IOHEXOL  350 MG/ML SOLN COMPARISON:  CT PE protocol 08/24/2023. CT chest abdomen and pelvis 01/30/2023. CT chest 01/26/2020. FINDINGS: CTA CHEST FINDINGS Cardiovascular: Aorta is normal in size. There is no evidence for aortic dissection or aneurysm. Origin of the great vessels appears patent. Aberrant right subclavian artery present. There are mild calcified atherosclerotic plaque throughout the aorta. The heart is mildly enlarged. There is no pericardial effusion. There are atherosclerotic calcifications of the coronary arteries. Mediastinum/Nodes: No enlarged mediastinal, hilar, or axillary lymph nodes. Thyroid gland, trachea, and esophagus demonstrate no significant findings. Lungs/Pleura: Right lower lobe nodular density peripherally measures 7 mm image 6/87, unchanged. There is also stable right lower lobe nodule image 6/81 measuring 5 mm. These are unchanged from 2021 and favored as benign. The lungs are otherwise clear. There is no pleural effusion or pneumothorax. Musculoskeletal: Cervical spinal fusion plate  is present. There is T10 compression fracture which appears acute or subacute and has progressed compared to the prior study. Review of the MIP images confirms the above findings. CTA ABDOMEN AND PELVIS FINDINGS VASCULAR Aorta: Normal caliber aorta without aneurysm, dissection, vasculitis or significant stenosis. Mild calcified atherosclerotic disease present. Celiac: Patent without evidence of aneurysm, dissection, vasculitis or significant stenosis. SMA: Patent without evidence of aneurysm, dissection, vasculitis or significant stenosis. Renals: Both renal arteries are patent without evidence of aneurysm, dissection, vasculitis, fibromuscular dysplasia or significant stenosis. IMA: Patent without evidence of aneurysm, dissection, vasculitis or significant stenosis. Inflow: Patent without evidence of aneurysm, dissection, vasculitis or significant stenosis. Veins: No obvious venous abnormality within the limitations of this arterial phase study. Review of the MIP images confirms the above findings. NON-VASCULAR Hepatobiliary: No focal liver abnormality is seen. No gallstones, gallbladder wall thickening, or biliary dilatation. Pancreas: Unremarkable. No pancreatic ductal dilatation or surrounding inflammatory changes. Spleen: Normal in size without focal abnormality. Adrenals/Urinary Tract: Bilateral renal cysts are present. The largest is in the superior pole of the left kidney measuring 3.3 cm. There is no hydronephrosis or perinephric fluid. Adrenal glands and bladder are within normal limits. Stomach/Bowel: Stomach is within normal limits. Appendix appears normal. No evidence of bowel wall thickening, distention, or inflammatory changes. Lymphatic: No enlarged lymph nodes are seen. Reproductive: TURP defect again noted. Other: There is a small fat containing umbilical hernia. There is no ascites. Musculoskeletal: Prominent Schmorl's node of L3 is unchanged. Review of the MIP images confirms the above findings.  IMPRESSION: 1. No evidence for aortic dissection or aneurysm. 2. No acute cardiopulmonary process. 3. T10 compression fracture has progressed compared to the prior study. 4. No acute localizing process in the abdomen or pelvis. Aortic Atherosclerosis (ICD10-I70.0). Electronically Signed   By: Greig Pique M.D.   On: 02/25/2024 16:42    EKG: Independently reviewed.   Assessment/Plan  Afibr with RVR  -continue on cardizem  drip  -wean as able  -of note bp currently borderline , may need to switch to amiodarone  -hold oral metoprolol  currently due to lower bp  -f/u echo in am  Chest pain  Hx of CAD s/p DESx 2 mid LAD -CE flat  -echo pending for am  -f/u with cardiology consult in am   CHFpeF  -Appears euvolemic  -hold beta-blocker, Aldactone ,torsemide  in setting of relative hypotension, resume as able   DMII -Last A1c 6.7 -Continue home regimen -iss/fs   CKDIIIb  -at baseline    OSA  Continue CPAP   DVT prophylaxis:  on xarelto   Code Status:  full/ as discussed per patient wishes in event of cardiac arrest  Family Communication: none at bedside Disposition Plan: patient  expected to be admitted greater than 2 midnights  Consults called:  please consult cardiology in am Admission status: progressive care    Camila DELENA Ned MD [facility]   If 7PM-7AM, please contact night-coverage [email or web address] Password [email or web address]  02/26/2024, 1:11 AM

## 2024-02-26 NOTE — Transfer of Care (Signed)
 Immediate Anesthesia Transfer of Care Note  Patient: Mike Hunter  Procedure(s) Performed: CARDIOVERSION  Patient Location: Cath Lab  Anesthesia Type:MAC  Level of Consciousness: awake, alert , and oriented  Airway & Oxygen Therapy: Patient Spontanous Breathing and Patient connected to face mask oxygen  Post-op Assessment: Report given to RN and Post -op Vital signs reviewed and stable  Post vital signs: Reviewed and stable  Last Vitals:  Vitals Value Taken Time  BP 101/82  02/26/24 1559  Temp    Pulse 69 02/26/24 1559  Resp 21 02/26/24 1559  SpO2 100% 02/26/24 1559    Last Pain:  Vitals:   02/26/24 1530  TempSrc:   PainSc: 0-No pain         Complications: There were no known notable events for this encounter.

## 2024-02-26 NOTE — H&P (View-Only) (Signed)
 Cardiology Consultation   Patient ID: DEMERE DOTZLER MRN: [id number]; DOB: 04-Aug-1950  Admit date: 02/25/2024 Date of Consult: 02/26/2024  PCP:  Nikki Hansel Atlas, MD   [facility] Providers Cardiologist:  None        Patient Profile: Mike Hunter is a 73 y.o. male with a hx of AFib who is being seen 02/26/2024 for the evaluation of tachycardia at the request of ED/hospitalist service.  History of Present Illness: Mike Hunter is a 73 y.o. male with a hx of HFpEF, CAD s/p PCI with DES x 2 to mLAD on 11/06/2023, paroxysmal A-fib, moderate aortic stenosis, hypertension, hyperlipidemia, COPD, OSA on CPAP, carotid stenosis s/p left carotid stenting, CKD stage III and type 2 diabetes  He was most recently seen by cardiology in July for Afib which spontaneously reverted to SR on metoprolol  alone.  He presents w/ a few weeks of Afib symptoms - primarily palpitations, lightheadness, and muddy thinking.  Things got worse this morning after he had a large bowel movement, with his home monitor telling him that he was ranging from tahcycardia in the 140s to bradycardia in the 40s.  He felt profoundly weak, developed SOB as well as a chest stabbing sensation substernal in location.  No frank syncope.  Intermittent LE swelling but none currently.  Reports adherence to clopidogrel  and rivaroxaban .  No aspirin  use. He mentioned being on amiodarone  in the past but this resulted in heart rates that were low so it was discontinued.  In the ED, ECGs showed a tachycardic rhythm with rate around 130. Machine read is sinus tach, but looking at V2, I wonder if this is actually atypical atrial flutter?  He was started on a diltiazem  infusion (currently at 10). On telemetry here at [facility], he is back in Afib with rates in the 80s.  He still complains of chest discomfort and palpitations despite improved rate control.  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   High cholesterol    History of methicillin resistant staphylococcus aureus (MRSA)    Hypertension    Reflux    Renal disorder    Sleep apnea     Past Surgical History:  Procedure Laterality Date   CARDIOVERSION N/A 08/26/2023   Procedure: CARDIOVERSION;  Surgeon: Barbaraann Darryle Ned, MD;  Location: [facility] INVASIVE CV LAB;  Service: Cardiovascular;  Laterality: N/A;   CATARACT EXTRACTION     CERVICAL FUSION     CHOLECYSTECTOMY     KIDNEY STONE SURGERY     KNEE ARTHROSCOPY         Scheduled Meds:  Continuous Infusions:  diltiazem  (CARDIZEM ) infusion 10 mg/hr (02/25/24 1551)   PRN Meds:   Allergies:    Allergies  Allergen Reactions   Buprenorphine Hcl Nausea And Vomiting    Other reaction(s): Nausea And Vomiting    Hydromorphone  Other (See Comments)    Other reaction(s): Mental Status Changes (intolerance) Other reaction(s): HALLUCINATIONS hallucinations hallucinations hallucinations hallucinations    Codeine Nausea And Vomiting   Mirtazapine  Other reaction(s): Other (See Comments) Nightmares & altered mental status    Morphine  And Codeine Nausea And Vomiting    Social History:   Social History   Socioeconomic History   Marital status: Married    Spouse name: Not on file   Number of children: Not on file   Years of education: Not on file   Highest education level: Not on file  Occupational History   Not on file  Tobacco Use   Smoking status: Former   Smokeless tobacco: Never  Vaping Use   Vaping status: Never Used  Substance and Sexual Activity   Alcohol use: No   Drug use: No   Sexual activity: Not on file   Other Topics Concern   Not on file  Social History Narrative   Not on file   Social Drivers of Health   Financial Resource Strain: Not on file  Food Insecurity: Low Risk  (01/07/2024)   Received from [facility]   Hunger Vital Sign    Within the past 12 months, you worried that your food would run out before you got money to buy more: Never true    Within the past 12 months, the food you bought just didn't last and you didn't have money to get more. : Never true  Transportation Needs: No Transportation Needs (01/07/2024)   Received from [employer]    In the past 12 months, has lack of reliable transportation kept you from medical appointments, meetings, work or from getting things needed for daily living? : No  Physical Activity: Not on file  Stress: Not on file  Social Connections: Socially Integrated (12/30/2023)   Social Connection and Isolation Panel    Frequency of Communication with Friends and Family: Twice a week    Frequency of Social Gatherings with Friends and Family: Three times a week    Attends Religious Services: More than 4 times per year    Active Member of Clubs or Organizations: Yes    Attends Banker Meetings: 1 to 4 times per year    Marital Status: Married  Catering manager Violence: Not At Risk (12/30/2023)   Humiliation, Afraid, Rape, and Kick questionnaire    Fear of Current or Ex-Partner: No    Emotionally Abused: No    Physically Abused: No    Sexually Abused: No    Family History:   No family history on file.   ROS:  Please see the history of present illness.   All other ROS reviewed and negative.     Physical Exam/Data: Vitals:   02/25/24 2100 02/25/24 2305 02/25/24 2306 02/26/24 0015  BP: 100/68 121/87  110/81  Pulse: 89 78  67  Resp: 20   14  Temp:   98 F (36.7 C) 98.8 F (37.1 C)  TempSrc:   Oral Oral  SpO2: 100% 98%  98%  Weight:    124.8 kg  Height:    5' 10 (1.778 m)    Intake/Output Summary  (Last 24 hours) at 02/26/2024 0134 Last data filed at 02/25/2024 2022 Gross per 24 hour  Intake 500 ml  Output --  Net 500 ml      02/26/2024   12:15 AM 02/25/2024    3:04 PM 01/05/2024    5:28 AM  Last 3 Weights  Weight (lbs) 275 lb 2.2 oz 265 lb 259 lb 14.4 oz  Weight (kg) 124.8 kg 120.203 kg 117.89 kg     Body mass index  is 39.48 kg/m.  General:  Obese, well developed, in no acute distress lying flat in bed and speaking in full sentences HEENT: normal Neck: unable to visualize JVD Vascular: Distal pulses 2+ bilaterally Cardiac:  normal S1, S2; irregular with soft systolic ejection murmur Lungs:  minimal wheezing Abd: soft, nontender, no hepatomegaly  Ext: no edema Musculoskeletal:  No deformities, BUE and BLE strength normal and equal Skin: warm and dry  Neuro:  CNs 2-12 intact, no focal abnormalities noted Psych:  Normal affect and talkative  Laboratory Data: High Sensitivity Troponin:  No results for input(s): TROPONINIHS in the last 720 hours.   Chemistry Recent Labs  Lab 02/25/24 1518  NA 140  K 3.7  CL 101  CO2 25  GLUCOSE 203*  BUN 27*  CREATININE 1.55*  CALCIUM  8.4*  GFRNONAA 46*  ANIONGAP 14    Recent Labs  Lab 02/25/24 1558  PROT 7.3  ALBUMIN  4.1  AST 20  ALT 22  ALKPHOS 113  BILITOT 0.4   Lipids No results for input(s): CHOL, TRIG, HDL, LABVLDL, LDLCALC, CHOLHDL in the last 168 hours.  Hematology Recent Labs  Lab 02/25/24 1518  WBC 11.4*  RBC 4.69  HGB 10.2*  HCT 34.7*  MCV 74.0*  MCH 21.7*  MCHC 29.4*  RDW 17.5*  PLT 388   Thyroid No results for input(s): TSH, FREET4 in the last 168 hours.  BNP Recent Labs  Lab 02/25/24 1520  PROBNP 2,137.0*    DDimer No results for input(s): DDIMER in the last 168 hours.  Radiology/Studies:  CT Angio Chest/Abd/Pel for Dissection W and/or Wo Contrast Result Date: 02/25/2024 CLINICAL DATA:  Evaluate for dissection. EXAM: CT ANGIOGRAPHY CHEST, ABDOMEN AND PELVIS TECHNIQUE:  Noncontrast chest CT was performed. Multidetector CT imaging through the chest, abdomen and pelvis was performed using the standard protocol during bolus administration of intravenous contrast. Multiplanar reconstructed images and MIPs were obtained and reviewed to evaluate the vascular anatomy. RADIATION DOSE REDUCTION: This exam was performed according to the departmental dose-optimization program which includes automated exposure control, adjustment of the mA and/or kV according to patient size and/or use of iterative reconstruction technique. CONTRAST:  OMNIPAQUE  IOHEXOL  350 MG/ML SOLN COMPARISON:  CT PE protocol 08/24/2023. CT chest abdomen and pelvis 01/30/2023. CT chest 01/26/2020. FINDINGS: CTA CHEST FINDINGS Cardiovascular: Aorta is normal in size. There is no evidence for aortic dissection or aneurysm. Origin of the great vessels appears patent. Aberrant right subclavian artery present. There are mild calcified atherosclerotic plaque throughout the aorta. The heart is mildly enlarged. There is no pericardial effusion. There are atherosclerotic calcifications of the coronary arteries. Mediastinum/Nodes: No enlarged mediastinal, hilar, or axillary lymph nodes. Thyroid gland, trachea, and esophagus demonstrate no significant findings. Lungs/Pleura: Right lower lobe nodular density peripherally measures 7 mm image 6/87, unchanged. There is also stable right lower lobe nodule image 6/81 measuring 5 mm. These are unchanged from 2021 and favored as benign. The lungs are otherwise clear. There is no pleural effusion or pneumothorax. Musculoskeletal: Cervical spinal fusion plate is present. There is T10 compression fracture which appears acute or subacute and has progressed compared to the prior study. Review of the MIP images confirms the above findings. CTA ABDOMEN AND PELVIS FINDINGS VASCULAR Aorta: Normal caliber aorta without aneurysm, dissection, vasculitis or significant stenosis. Mild calcified  atherosclerotic disease present. Celiac: Patent without evidence of aneurysm, dissection, vasculitis or significant stenosis. SMA: Patent without evidence of aneurysm, dissection, vasculitis or significant stenosis. Renals: Both renal arteries are patent without  evidence of aneurysm, dissection, vasculitis, fibromuscular dysplasia or significant stenosis. IMA: Patent without evidence of aneurysm, dissection, vasculitis or significant stenosis. Inflow: Patent without evidence of aneurysm, dissection, vasculitis or significant stenosis. Veins: No obvious venous abnormality within the limitations of this arterial phase study. Review of the MIP images confirms the above findings. NON-VASCULAR Hepatobiliary: No focal liver abnormality is seen. No gallstones, gallbladder wall thickening, or biliary dilatation. Pancreas: Unremarkable. No pancreatic ductal dilatation or surrounding inflammatory changes. Spleen: Normal in size without focal abnormality. Adrenals/Urinary Tract: Bilateral renal cysts are present. The largest is in the superior pole of the left kidney measuring 3.3 cm. There is no hydronephrosis or perinephric fluid. Adrenal glands and bladder are within normal limits. Stomach/Bowel: Stomach is within normal limits. Appendix appears normal. No evidence of bowel wall thickening, distention, or inflammatory changes. Lymphatic: No enlarged lymph nodes are seen. Reproductive: TURP defect again noted. Other: There is a small fat containing umbilical hernia. There is no ascites. Musculoskeletal: Prominent Schmorl's node of L3 is unchanged. Review of the MIP images confirms the above findings. IMPRESSION: 1. No evidence for aortic dissection or aneurysm. 2. No acute cardiopulmonary process. 3. T10 compression fracture has progressed compared to the prior study. 4. No acute localizing process in the abdomen or pelvis. Aortic Atherosclerosis (ICD10-I70.0). Electronically Signed   By: Greig Pique M.D.   On: 02/25/2024  16:42   Last echo in 12/2023 shows preserved EF and mild to moderate AS  Assessment and Plan: Symptomatic Afib, with possible atrial flutter on admission to the ED.  H/O bradycardia per patient report on rhythm controlling agent.  Currently rate controlled but still complaining of symptoms. Cardioverted before in March this year.  Might consider cardioversion again to see if he's able to stay out of Afib.  Day team will also weigh in on whether another antirhythmic should be considered given his frequent AF recurrences.  For now, ok to continue dilt infusion for rate control, and continue anticoagulation.    Minimal evidence of volume overload, so would not aggressively diurese.    Risk Assessment/Risk Scores:         CHA2DS2-VASc Score = 6   This indicates a 9.7% annual risk of stroke. The patient's score is based upon: CHF History: 1 HTN History: 1 Diabetes History: 1 Stroke History: 2 Vascular Disease History: 0 Age Score: 1 Gender Score: 0        For questions or updates, please contact [facility] Please consult [email or web address] for contact info under      Signed, Andee Flatten, MD  02/26/2024 1:34 AM

## 2024-02-26 NOTE — Plan of Care (Signed)

## 2024-02-26 NOTE — Progress Notes (Signed)
   02/26/24 0219  BiPAP/CPAP/SIPAP  $ Non-Invasive Ventilator  Non-Invasive Vent Set Up;Non-Invasive Vent Initial  $ Face Mask Large  Yes  BiPAP/CPAP/SIPAP Pt Type Adult  BiPAP/CPAP/SIPAP Resmed  Mask Type Full face mask  Dentures removed? Not applicable  Respiratory Rate 18 breaths/min  Flow Rate 2 lpm  Patient Home Machine No  Patient Home Mask No  Patient Home Tubing No  Auto Titrate Yes  CPAP/SIPAP surface wiped down Yes  Device Plugged into RED Power Outlet Yes  BiPAP/CPAP /SiPAP Vitals  Bilateral Breath Sounds Clear;Diminished

## 2024-02-26 NOTE — Care Management Obs Status (Signed)
 MEDICARE OBSERVATION STATUS NOTIFICATION   Patient Details  Name: Mike Hunter MRN: [id number] Date of Birth: [birth year]   Medicare Observation Status Notification Given:  Yes    Vonzell Arrie Sharps 02/26/2024, 9:54 AM

## 2024-02-26 NOTE — Consult Note (Signed)
 Cardiology Consultation   Patient ID: DEMERE DOTZLER MRN: [id number]; DOB: 04-Aug-1950  Admit date: 02/25/2024 Date of Consult: 02/26/2024  PCP:  Nikki Hansel Atlas, MD   [facility] Providers Cardiologist:  None        Patient Profile: Mike Hunter is a 73 y.o. male with a hx of AFib who is being seen 02/26/2024 for the evaluation of tachycardia at the request of ED/hospitalist service.  History of Present Illness: Mike Hunter is a 73 y.o. male with a hx of HFpEF, CAD s/p PCI with DES x 2 to mLAD on 11/06/2023, paroxysmal A-fib, moderate aortic stenosis, hypertension, hyperlipidemia, COPD, OSA on CPAP, carotid stenosis s/p left carotid stenting, CKD stage III and type 2 diabetes  He was most recently seen by cardiology in July for Afib which spontaneously reverted to SR on metoprolol  alone.  He presents w/ a few weeks of Afib symptoms - primarily palpitations, lightheadness, and muddy thinking.  Things got worse this morning after he had a large bowel movement, with his home monitor telling him that he was ranging from tahcycardia in the 140s to bradycardia in the 40s.  He felt profoundly weak, developed SOB as well as a chest stabbing sensation substernal in location.  No frank syncope.  Intermittent LE swelling but none currently.  Reports adherence to clopidogrel  and rivaroxaban .  No aspirin  use. He mentioned being on amiodarone  in the past but this resulted in heart rates that were low so it was discontinued.  In the ED, ECGs showed a tachycardic rhythm with rate around 130. Machine read is sinus tach, but looking at V2, I wonder if this is actually atypical atrial flutter?  He was started on a diltiazem  infusion (currently at 10). On telemetry here at [facility], he is back in Afib with rates in the 80s.  He still complains of chest discomfort and palpitations despite improved rate control.  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   High cholesterol    History of methicillin resistant staphylococcus aureus (MRSA)    Hypertension    Reflux    Renal disorder    Sleep apnea     Past Surgical History:  Procedure Laterality Date   CARDIOVERSION N/A 08/26/2023   Procedure: CARDIOVERSION;  Surgeon: Barbaraann Darryle Ned, MD;  Location: [facility] INVASIVE CV LAB;  Service: Cardiovascular;  Laterality: N/A;   CATARACT EXTRACTION     CERVICAL FUSION     CHOLECYSTECTOMY     KIDNEY STONE SURGERY     KNEE ARTHROSCOPY         Scheduled Meds:  Continuous Infusions:  diltiazem  (CARDIZEM ) infusion 10 mg/hr (02/25/24 1551)   PRN Meds:   Allergies:    Allergies  Allergen Reactions   Buprenorphine Hcl Nausea And Vomiting    Other reaction(s): Nausea And Vomiting    Hydromorphone  Other (See Comments)    Other reaction(s): Mental Status Changes (intolerance) Other reaction(s): HALLUCINATIONS hallucinations hallucinations hallucinations hallucinations    Codeine Nausea And Vomiting   Mirtazapine  Other reaction(s): Other (See Comments) Nightmares & altered mental status    Morphine  And Codeine Nausea And Vomiting    Social History:   Social History   Socioeconomic History   Marital status: Married    Spouse name: Not on file   Number of children: Not on file   Years of education: Not on file   Highest education level: Not on file  Occupational History   Not on file  Tobacco Use   Smoking status: Former   Smokeless tobacco: Never  Vaping Use   Vaping status: Never Used  Substance and Sexual Activity   Alcohol use: No   Drug use: No   Sexual activity: Not on file   Other Topics Concern   Not on file  Social History Narrative   Not on file   Social Drivers of Health   Financial Resource Strain: Not on file  Food Insecurity: Low Risk  (01/07/2024)   Received from [facility]   Hunger Vital Sign    Within the past 12 months, you worried that your food would run out before you got money to buy more: Never true    Within the past 12 months, the food you bought just didn't last and you didn't have money to get more. : Never true  Transportation Needs: No Transportation Needs (01/07/2024)   Received from [employer]    In the past 12 months, has lack of reliable transportation kept you from medical appointments, meetings, work or from getting things needed for daily living? : No  Physical Activity: Not on file  Stress: Not on file  Social Connections: Socially Integrated (12/30/2023)   Social Connection and Isolation Panel    Frequency of Communication with Friends and Family: Twice a week    Frequency of Social Gatherings with Friends and Family: Three times a week    Attends Religious Services: More than 4 times per year    Active Member of Clubs or Organizations: Yes    Attends Banker Meetings: 1 to 4 times per year    Marital Status: Married  Catering manager Violence: Not At Risk (12/30/2023)   Humiliation, Afraid, Rape, and Kick questionnaire    Fear of Current or Ex-Partner: No    Emotionally Abused: No    Physically Abused: No    Sexually Abused: No    Family History:   No family history on file.   ROS:  Please see the history of present illness.   All other ROS reviewed and negative.     Physical Exam/Data: Vitals:   02/25/24 2100 02/25/24 2305 02/25/24 2306 02/26/24 0015  BP: 100/68 121/87  110/81  Pulse: 89 78  67  Resp: 20   14  Temp:   98 F (36.7 C) 98.8 F (37.1 C)  TempSrc:   Oral Oral  SpO2: 100% 98%  98%  Weight:    124.8 kg  Height:    5' 10 (1.778 m)    Intake/Output Summary  (Last 24 hours) at 02/26/2024 0134 Last data filed at 02/25/2024 2022 Gross per 24 hour  Intake 500 ml  Output --  Net 500 ml      02/26/2024   12:15 AM 02/25/2024    3:04 PM 01/05/2024    5:28 AM  Last 3 Weights  Weight (lbs) 275 lb 2.2 oz 265 lb 259 lb 14.4 oz  Weight (kg) 124.8 kg 120.203 kg 117.89 kg     Body mass index  is 39.48 kg/m.  General:  Obese, well developed, in no acute distress lying flat in bed and speaking in full sentences HEENT: normal Neck: unable to visualize JVD Vascular: Distal pulses 2+ bilaterally Cardiac:  normal S1, S2; irregular with soft systolic ejection murmur Lungs:  minimal wheezing Abd: soft, nontender, no hepatomegaly  Ext: no edema Musculoskeletal:  No deformities, BUE and BLE strength normal and equal Skin: warm and dry  Neuro:  CNs 2-12 intact, no focal abnormalities noted Psych:  Normal affect and talkative  Laboratory Data: High Sensitivity Troponin:  No results for input(s): TROPONINIHS in the last 720 hours.   Chemistry Recent Labs  Lab 02/25/24 1518  NA 140  K 3.7  CL 101  CO2 25  GLUCOSE 203*  BUN 27*  CREATININE 1.55*  CALCIUM  8.4*  GFRNONAA 46*  ANIONGAP 14    Recent Labs  Lab 02/25/24 1558  PROT 7.3  ALBUMIN  4.1  AST 20  ALT 22  ALKPHOS 113  BILITOT 0.4   Lipids No results for input(s): CHOL, TRIG, HDL, LABVLDL, LDLCALC, CHOLHDL in the last 168 hours.  Hematology Recent Labs  Lab 02/25/24 1518  WBC 11.4*  RBC 4.69  HGB 10.2*  HCT 34.7*  MCV 74.0*  MCH 21.7*  MCHC 29.4*  RDW 17.5*  PLT 388   Thyroid No results for input(s): TSH, FREET4 in the last 168 hours.  BNP Recent Labs  Lab 02/25/24 1520  PROBNP 2,137.0*    DDimer No results for input(s): DDIMER in the last 168 hours.  Radiology/Studies:  CT Angio Chest/Abd/Pel for Dissection W and/or Wo Contrast Result Date: 02/25/2024 CLINICAL DATA:  Evaluate for dissection. EXAM: CT ANGIOGRAPHY CHEST, ABDOMEN AND PELVIS TECHNIQUE:  Noncontrast chest CT was performed. Multidetector CT imaging through the chest, abdomen and pelvis was performed using the standard protocol during bolus administration of intravenous contrast. Multiplanar reconstructed images and MIPs were obtained and reviewed to evaluate the vascular anatomy. RADIATION DOSE REDUCTION: This exam was performed according to the departmental dose-optimization program which includes automated exposure control, adjustment of the mA and/or kV according to patient size and/or use of iterative reconstruction technique. CONTRAST:  OMNIPAQUE  IOHEXOL  350 MG/ML SOLN COMPARISON:  CT PE protocol 08/24/2023. CT chest abdomen and pelvis 01/30/2023. CT chest 01/26/2020. FINDINGS: CTA CHEST FINDINGS Cardiovascular: Aorta is normal in size. There is no evidence for aortic dissection or aneurysm. Origin of the great vessels appears patent. Aberrant right subclavian artery present. There are mild calcified atherosclerotic plaque throughout the aorta. The heart is mildly enlarged. There is no pericardial effusion. There are atherosclerotic calcifications of the coronary arteries. Mediastinum/Nodes: No enlarged mediastinal, hilar, or axillary lymph nodes. Thyroid gland, trachea, and esophagus demonstrate no significant findings. Lungs/Pleura: Right lower lobe nodular density peripherally measures 7 mm image 6/87, unchanged. There is also stable right lower lobe nodule image 6/81 measuring 5 mm. These are unchanged from 2021 and favored as benign. The lungs are otherwise clear. There is no pleural effusion or pneumothorax. Musculoskeletal: Cervical spinal fusion plate is present. There is T10 compression fracture which appears acute or subacute and has progressed compared to the prior study. Review of the MIP images confirms the above findings. CTA ABDOMEN AND PELVIS FINDINGS VASCULAR Aorta: Normal caliber aorta without aneurysm, dissection, vasculitis or significant stenosis. Mild calcified  atherosclerotic disease present. Celiac: Patent without evidence of aneurysm, dissection, vasculitis or significant stenosis. SMA: Patent without evidence of aneurysm, dissection, vasculitis or significant stenosis. Renals: Both renal arteries are patent without  evidence of aneurysm, dissection, vasculitis, fibromuscular dysplasia or significant stenosis. IMA: Patent without evidence of aneurysm, dissection, vasculitis or significant stenosis. Inflow: Patent without evidence of aneurysm, dissection, vasculitis or significant stenosis. Veins: No obvious venous abnormality within the limitations of this arterial phase study. Review of the MIP images confirms the above findings. NON-VASCULAR Hepatobiliary: No focal liver abnormality is seen. No gallstones, gallbladder wall thickening, or biliary dilatation. Pancreas: Unremarkable. No pancreatic ductal dilatation or surrounding inflammatory changes. Spleen: Normal in size without focal abnormality. Adrenals/Urinary Tract: Bilateral renal cysts are present. The largest is in the superior pole of the left kidney measuring 3.3 cm. There is no hydronephrosis or perinephric fluid. Adrenal glands and bladder are within normal limits. Stomach/Bowel: Stomach is within normal limits. Appendix appears normal. No evidence of bowel wall thickening, distention, or inflammatory changes. Lymphatic: No enlarged lymph nodes are seen. Reproductive: TURP defect again noted. Other: There is a small fat containing umbilical hernia. There is no ascites. Musculoskeletal: Prominent Schmorl's node of L3 is unchanged. Review of the MIP images confirms the above findings. IMPRESSION: 1. No evidence for aortic dissection or aneurysm. 2. No acute cardiopulmonary process. 3. T10 compression fracture has progressed compared to the prior study. 4. No acute localizing process in the abdomen or pelvis. Aortic Atherosclerosis (ICD10-I70.0). Electronically Signed   By: Greig Pique M.D.   On: 02/25/2024  16:42   Last echo in 12/2023 shows preserved EF and mild to moderate AS  Assessment and Plan: Symptomatic Afib, with possible atrial flutter on admission to the ED.  H/O bradycardia per patient report on rhythm controlling agent.  Currently rate controlled but still complaining of symptoms. Cardioverted before in March this year.  Might consider cardioversion again to see if he's able to stay out of Afib.  Day team will also weigh in on whether another antirhythmic should be considered given his frequent AF recurrences.  For now, ok to continue dilt infusion for rate control, and continue anticoagulation.    Minimal evidence of volume overload, so would not aggressively diurese.    Risk Assessment/Risk Scores:         CHA2DS2-VASc Score = 6   This indicates a 9.7% annual risk of stroke. The patient's score is based upon: CHF History: 1 HTN History: 1 Diabetes History: 1 Stroke History: 2 Vascular Disease History: 0 Age Score: 1 Gender Score: 0        For questions or updates, please contact [facility] Please consult [email or web address] for contact info under      Signed, Andee Flatten, MD  02/26/2024 1:34 AM

## 2024-02-26 NOTE — Anesthesia Preprocedure Evaluation (Addendum)
 Anesthesia Evaluation  Patient identified by MRN, date of birth, ID band Patient awake    Reviewed: Allergy & Precautions, NPO status , Patient's Chart, lab work & pertinent test results, reviewed documented beta blocker date and time   History of Anesthesia Complications Negative for: history of anesthetic complications  Airway Mallampati: III  TM Distance: >3 FB Neck ROM: Full    Dental  (+) Missing, Dental Advisory Given   Pulmonary asthma , sleep apnea (uses 2L at night and during the day as needed), Continuous Positive Airway Pressure Ventilation and Oxygen sleep apnea , pneumonia (started antibiotics last night), unresolved, COPD (used inhaler this morning),  COPD inhaler and oxygen dependent, former smoker   Pulmonary exam normal breath sounds clear to auscultation       Cardiovascular hypertension, Pt. on medications and Pt. on home beta blockers + CAD (non-obstructive) and +CHF (EF 60-65%, grade III diastolic dysfunction)  + dysrhythmias Atrial Fibrillation + Valvular Problems/Murmurs (mild-to-moderate AS)  Rhythm:Irregular Rate:Normal  Echo   1. Left ventricular ejection fraction, by estimation, is 45 to 50%. The  left ventricle has mildly decreased function. The left ventricle  demonstrates global hypokinesis. The left ventricular internal cavity size  was mildly dilated. Left ventricular  diastolic parameters are indeterminate.   2. Right ventricular systolic function is normal. The right ventricular  size is normal. There is mildly elevated pulmonary artery systolic  pressure. The estimated right ventricular systolic pressure is 37.8 mmHg.   3. Left atrial size was mildly dilated.   4. The mitral valve is normal in structure. Mild mitral valve  regurgitation.   5. The aortic valve is calcified. Aortic valve regurgitation is not  visualized. Mild to moderate aortic valve stenosis. Vmax 2.8 m/s, MG 21  mmHg, AVA 1.6  cm^2, DI 0.45   6. The inferior vena cava is normal in size with greater than 50%  respiratory variability, suggesting right atrial pressure of 3 mmHg.     TTE 06/09/2023: IMPRESSIONS    1. Left ventricular ejection fraction, by estimation, is 60 to 65%. The  left ventricle has normal function. The left ventricle has no regional  wall motion abnormalities. Left ventricular diastolic parameters are  consistent with Grade III diastolic  dysfunction (restrictive).   2. Right ventricular systolic function is normal. The right ventricular  size is moderately enlarged.   3. Left atrial size was mildly dilated.   4. A small pericardial effusion is present. The pericardial effusion is  posterior to the left ventricle.   5. The mitral valve is normal in structure. Trivial mitral valve  regurgitation. No evidence of mitral stenosis.   6. Normal stroke volume index with DVI .032. The aortic valve is  calcified. There is moderate calcification of the aortic valve. Aortic  valve regurgitation is not visualized. Mild to moderate aortic valve  stenosis. Aortic valve area, by VTI measures  1.34 cm. Aortic valve mean gradient measures 22.0 mmHg. Aortic valve Vmax  measures 2.92 m/s.   7. The inferior vena cava is dilated in size with >50% respiratory  variability, suggesting right atrial pressure of 8 mmHg.     Neuro/Psych  PSYCHIATRIC DISORDERS (conversion disorder) Anxiety Depression Bipolar Disorder   negative neurological ROS     GI/Hepatic ,GERD  Medicated,,  Endo/Other  diabetes, Poorly Controlled, Type 2, Insulin  Dependent  Class 3 obesity  Renal/GU CRFRenal disease     Musculoskeletal  (+) Arthritis ,    Abdominal   Peds  Hematology  (+)  Blood dyscrasia, anemia Lab Results      Component                Value               Date                      WBC                      11.2 (H)            08/25/2023                HGB                      10.8 (L)            08/25/2023                 HCT                      35.7 (L)            08/25/2023                MCV                      76.8 (L)            08/25/2023                PLT                      376                 08/25/2023              Anesthesia Other Findings Last Plavix : yesterday  Last Xarelto : yesterday  Reproductive/Obstetrics                              Anesthesia Physical Anesthesia Plan  ASA: 3  Anesthesia Plan: General   Post-op Pain Management: Minimal or no pain anticipated   Induction: Intravenous  PONV Risk Score and Plan: 2 and Treatment may vary due to age or medical condition, Propofol  infusion and TIVA  Airway Management Planned: Natural Airway  Additional Equipment:   Intra-op Plan:   Post-operative Plan:   Informed Consent: I have reviewed the patients History and Physical, chart, labs and discussed the procedure including the risks, benefits and alternatives for the proposed anesthesia with the patient or authorized representative who has indicated his/her understanding and acceptance.     Dental advisory given  Plan Discussed with: CRNA  Anesthesia Plan Comments:          Anesthesia Quick Evaluation

## 2024-02-27 ENCOUNTER — Encounter (HOSPITAL_COMMUNITY): Payer: Self-pay | Admitting: Cardiology

## 2024-02-27 ENCOUNTER — Inpatient Hospital Stay (HOSPITAL_COMMUNITY)

## 2024-02-27 DIAGNOSIS — I4891 Unspecified atrial fibrillation: Secondary | ICD-10-CM | POA: Diagnosis not present

## 2024-02-27 DIAGNOSIS — R079 Chest pain, unspecified: Secondary | ICD-10-CM | POA: Diagnosis not present

## 2024-02-27 DIAGNOSIS — R000 Tachycardia, unspecified: Secondary | ICD-10-CM

## 2024-02-27 DIAGNOSIS — R110 Nausea: Secondary | ICD-10-CM | POA: Diagnosis not present

## 2024-02-27 DIAGNOSIS — I4892 Unspecified atrial flutter: Secondary | ICD-10-CM | POA: Diagnosis not present

## 2024-02-27 DIAGNOSIS — J9690 Respiratory failure, unspecified, unspecified whether with hypoxia or hypercapnia: Secondary | ICD-10-CM

## 2024-02-27 LAB — COMPREHENSIVE METABOLIC PANEL WITH GFR
ALT: 19 U/L (ref 0–44)
AST: 17 U/L (ref 15–41)
Albumin: 2.9 g/dL — ABNORMAL LOW (ref 3.5–5.0)
Alkaline Phosphatase: 80 U/L (ref 38–126)
Anion gap: 12 (ref 5–15)
BUN: 20 mg/dL (ref 8–23)
CO2: 20 mmol/L — ABNORMAL LOW (ref 22–32)
Calcium: 8.3 mg/dL — ABNORMAL LOW (ref 8.9–10.3)
Chloride: 104 mmol/L (ref 98–111)
Creatinine, Ser: 1.65 mg/dL — ABNORMAL HIGH (ref 0.61–1.24)
GFR, Estimated: 43 mL/min — ABNORMAL LOW (ref >60)
Glucose, Bld: 327 mg/dL — ABNORMAL HIGH (ref 70–99)
Potassium: 3.8 mmol/L (ref 3.5–5.1)
Sodium: 136 mmol/L (ref 135–145)
Total Bilirubin: 1.2 mg/dL (ref 0.0–1.2)
Total Protein: 6.7 g/dL (ref 6.5–8.1)

## 2024-02-27 LAB — CBC
HCT: 34.4 % — ABNORMAL LOW (ref 39.0–52.0)
Hemoglobin: 10.1 g/dL — ABNORMAL LOW (ref 13.0–17.0)
MCH: 22 pg — ABNORMAL LOW (ref 26.0–34.0)
MCHC: 29.4 g/dL — ABNORMAL LOW (ref 30.0–36.0)
MCV: 74.9 fL — ABNORMAL LOW (ref 80.0–100.0)
Platelets: 342 K/uL (ref 150–400)
RBC: 4.59 MIL/uL (ref 4.22–5.81)
RDW: 17.3 % — ABNORMAL HIGH (ref 11.5–15.5)
WBC: 17.7 K/uL — ABNORMAL HIGH (ref 4.0–10.5)
nRBC: 0 % (ref 0.0–0.2)

## 2024-02-27 LAB — URINALYSIS, W/ REFLEX TO CULTURE (INFECTION SUSPECTED)
Bilirubin Urine: NEGATIVE
Glucose, UA: >=500 mg/dL — AB
Hgb urine dipstick: NEGATIVE
Ketones, ur: NEGATIVE mg/dL
Leukocytes,Ua: NEGATIVE
Nitrite: NEGATIVE
Protein, ur: NEGATIVE mg/dL
Specific Gravity, Urine: 1.005 (ref 1.005–1.030)
pH: 7 (ref 5.0–8.0)

## 2024-02-27 LAB — GLUCOSE, CAPILLARY
Glucose-Capillary: 196 mg/dL — ABNORMAL HIGH (ref 70–99)
Glucose-Capillary: 233 mg/dL — ABNORMAL HIGH (ref 70–99)
Glucose-Capillary: 295 mg/dL — ABNORMAL HIGH (ref 70–99)
Glucose-Capillary: 228 mg/dL — ABNORMAL HIGH (ref 70–99)

## 2024-02-27 LAB — BRAIN NATRIURETIC PEPTIDE: B Natriuretic Peptide: 222 pg/mL — ABNORMAL HIGH (ref 0.0–100.0)

## 2024-02-27 LAB — RENAL FUNCTION PANEL
Albumin: 3.2 g/dL — ABNORMAL LOW (ref 3.5–5.0)
Anion gap: 12 (ref 5–15)
BUN: 19 mg/dL (ref 8–23)
CO2: 21 mmol/L — ABNORMAL LOW (ref 22–32)
Calcium: 8.4 mg/dL — ABNORMAL LOW (ref 8.9–10.3)
Chloride: 106 mmol/L (ref 98–111)
Creatinine, Ser: 1.63 mg/dL — ABNORMAL HIGH (ref 0.61–1.24)
GFR, Estimated: 44 mL/min — ABNORMAL LOW
Glucose, Bld: 202 mg/dL — ABNORMAL HIGH (ref 70–99)
Phosphorus: 2.5 mg/dL (ref 2.5–4.6)
Potassium: 4 mmol/L (ref 3.5–5.1)
Sodium: 139 mmol/L (ref 135–145)

## 2024-02-27 LAB — TROPONIN I (HIGH SENSITIVITY)
Troponin I (High Sensitivity): 27 ng/L — ABNORMAL HIGH (ref <18)
Troponin I (High Sensitivity): 30 ng/L — ABNORMAL HIGH (ref <18)

## 2024-02-27 LAB — MAGNESIUM: Magnesium: 2.1 mg/dL (ref 1.7–2.4)

## 2024-02-27 MED ORDER — FUROSEMIDE 10 MG/ML IJ SOLN
40.0000 mg | Freq: Once | INTRAMUSCULAR | Status: AC
  Administered 2024-02-27: 40 mg via INTRAVENOUS
  Filled 2024-02-27: qty 4

## 2024-02-27 MED ORDER — AMIODARONE IV BOLUS ONLY 150 MG/100ML
150.0000 mg | Freq: Once | INTRAVENOUS | Status: AC
  Administered 2024-02-27: 150 mg via INTRAVENOUS
  Filled 2024-02-27: qty 100

## 2024-02-27 MED ORDER — ACETAMINOPHEN 325 MG PO TABS
650.0000 mg | ORAL_TABLET | ORAL | Status: DC | PRN
  Administered 2024-02-27 – 2024-03-01 (×6): 650 mg via ORAL
  Filled 2024-02-27 (×7): qty 2

## 2024-02-27 MED ORDER — ALBUMIN HUMAN 25 % IV SOLN
25.0000 g | INTRAVENOUS | Status: AC
  Administered 2024-02-27: 25 g via INTRAVENOUS
  Filled 2024-02-27: qty 100

## 2024-02-27 MED ORDER — INSULIN ASPART 100 UNIT/ML IJ SOLN: 3.0000 [IU] | Freq: Three times a day (TID) | INTRAMUSCULAR | Status: DC

## 2024-02-27 MED ORDER — ISOSORBIDE MONONITRATE ER 30 MG PO TB24
30.0000 mg | ORAL_TABLET | Freq: Every day | ORAL | Status: DC
Start: 2024-02-27 — End: 2024-03-04
  Administered 2024-02-27 – 2024-03-03 (×6): 30 mg via ORAL
  Filled 2024-02-27 (×8): qty 1

## 2024-02-27 MED ORDER — INSULIN GLARGINE 100 UNIT/ML ~~LOC~~ SOLN
20.0000 [IU] | SUBCUTANEOUS | Status: DC
  Administered 2024-02-27: 20 [IU] via SUBCUTANEOUS
  Filled 2024-02-27 (×2): qty 0.2

## 2024-02-27 MED ORDER — KETOROLAC TROMETHAMINE 15 MG/ML IJ SOLN
15.0000 mg | Freq: Once | INTRAMUSCULAR | Status: AC
  Administered 2024-02-27: 15 mg via INTRAVENOUS
  Filled 2024-02-27: qty 1

## 2024-02-27 MED ORDER — METOPROLOL TARTRATE 12.5 MG HALF TABLET
12.5000 mg | ORAL_TABLET | Freq: Two times a day (BID) | ORAL | Status: DC
Start: 2024-02-27 — End: 2024-02-29
  Administered 2024-02-27 – 2024-02-29 (×5): 12.5 mg via ORAL
  Filled 2024-02-27 (×5): qty 1

## 2024-02-27 MED ORDER — MIRABEGRON ER 50 MG PO TB24
50.0000 mg | ORAL_TABLET | Freq: Every day | ORAL | Status: DC
  Administered 2024-02-27 – 2024-03-09 (×12): 50 mg via ORAL
  Filled 2024-02-27 (×12): qty 1

## 2024-02-27 MED ORDER — AMIODARONE HCL IN DEXTROSE 360-4.14 MG/200ML-% IV SOLN
INTRAVENOUS | Status: AC
  Filled 2024-02-27: qty 200

## 2024-02-27 MED ORDER — LATANOPROST 0.005 % OP SOLN
1.0000 [drp] | Freq: Every day | OPHTHALMIC | Status: DC
  Administered 2024-02-27 – 2024-03-08 (×11): 1 [drp] via OPHTHALMIC
  Filled 2024-02-27: qty 2.5

## 2024-02-27 MED ORDER — METOPROLOL TARTRATE 5 MG/5ML IV SOLN
5.0000 mg | Freq: Once | INTRAVENOUS | Status: AC
  Administered 2024-02-27: 5 mg via INTRAVENOUS

## 2024-02-27 MED ORDER — MIDODRINE HCL 5 MG PO TABS
10.0000 mg | ORAL_TABLET | ORAL | Status: AC
  Administered 2024-02-27: 10 mg via ORAL
  Filled 2024-02-27: qty 2

## 2024-02-27 MED ORDER — ARFORMOTEROL TARTRATE 15 MCG/2ML IN NEBU
15.0000 ug | INHALATION_SOLUTION | Freq: Two times a day (BID) | RESPIRATORY_TRACT | Status: DC
  Administered 2024-02-27 – 2024-03-09 (×24): 15 ug via RESPIRATORY_TRACT
  Filled 2024-02-27 (×23): qty 2

## 2024-02-27 MED ORDER — AMIODARONE HCL IN DEXTROSE 360-4.14 MG/200ML-% IV SOLN
30.0000 mg/h | INTRAVENOUS | Status: DC
  Administered 2024-02-27 – 2024-03-02 (×8): 30 mg/h via INTRAVENOUS
  Filled 2024-02-27 (×8): qty 200

## 2024-02-27 MED ORDER — MEMANTINE HCL 10 MG PO TABS
10.0000 mg | ORAL_TABLET | Freq: Two times a day (BID) | ORAL | Status: DC
  Administered 2024-02-27 – 2024-03-09 (×23): 10 mg via ORAL
  Filled 2024-02-27 (×23): qty 1

## 2024-02-27 MED ORDER — LEVALBUTEROL HCL 0.63 MG/3ML IN NEBU
0.6300 mg | INHALATION_SOLUTION | Freq: Four times a day (QID) | RESPIRATORY_TRACT | Status: DC
  Administered 2024-02-27 – 2024-02-29 (×8): 0.63 mg via RESPIRATORY_TRACT
  Filled 2024-02-27 (×9): qty 3

## 2024-02-27 MED ORDER — FENTANYL CITRATE PF 50 MCG/ML IJ SOSY
PREFILLED_SYRINGE | INTRAMUSCULAR | Status: AC
  Administered 2024-02-27: 50 ug
  Filled 2024-02-27: qty 1

## 2024-02-27 MED ORDER — SERTRALINE HCL 100 MG PO TABS
100.0000 mg | ORAL_TABLET | Freq: Every day | ORAL | Status: DC
  Administered 2024-02-27 – 2024-03-09 (×12): 100 mg via ORAL
  Filled 2024-02-27 (×12): qty 1

## 2024-02-27 MED ORDER — DONEPEZIL HCL 10 MG PO TABS
10.0000 mg | ORAL_TABLET | Freq: Every day | ORAL | Status: DC
  Administered 2024-02-27 – 2024-03-08 (×11): 10 mg via ORAL
  Filled 2024-02-27 (×11): qty 1

## 2024-02-27 MED ORDER — INSULIN ASPART 100 UNIT/ML IJ SOLN
4.0000 [IU] | Freq: Three times a day (TID) | INTRAMUSCULAR | Status: DC
  Administered 2024-02-27 – 2024-02-28 (×2): 4 [IU] via SUBCUTANEOUS

## 2024-02-27 MED ORDER — INSULIN ASPART 100 UNIT/ML IJ SOLN
0.0000 [IU] | Freq: Three times a day (TID) | INTRAMUSCULAR | Status: DC
  Administered 2024-02-27: 3 [IU] via SUBCUTANEOUS
  Administered 2024-02-27: 5 [IU] via SUBCUTANEOUS
  Administered 2024-02-27: 2 [IU] via SUBCUTANEOUS
  Administered 2024-02-28 – 2024-02-29 (×4): 3 [IU] via SUBCUTANEOUS

## 2024-02-27 MED ORDER — METOPROLOL TARTRATE 5 MG/5ML IV SOLN
INTRAVENOUS | Status: AC
  Filled 2024-02-27: qty 5

## 2024-02-27 MED ORDER — FUROSEMIDE 10 MG/ML IJ SOLN
40.0000 mg | Freq: Two times a day (BID) | INTRAMUSCULAR | Status: DC
  Administered 2024-02-27 – 2024-02-28 (×2): 40 mg via INTRAVENOUS
  Filled 2024-02-27 (×2): qty 4

## 2024-02-27 MED ORDER — DIGOXIN 0.25 MG/ML IJ SOLN
0.2500 mg | Freq: Three times a day (TID) | INTRAMUSCULAR | Status: AC
  Administered 2024-02-27 – 2024-02-28 (×3): 0.25 mg via INTRAVENOUS
  Filled 2024-02-27 (×3): qty 2

## 2024-02-27 MED ORDER — SPIRONOLACTONE 25 MG PO TABS
25.0000 mg | ORAL_TABLET | Freq: Every day | ORAL | Status: DC
Start: 2024-02-27 — End: 2024-03-09
  Administered 2024-02-27 – 2024-03-09 (×12): 25 mg via ORAL
  Filled 2024-02-27 (×12): qty 1

## 2024-02-27 MED ORDER — TORSEMIDE 20 MG PO TABS: 20.0000 mg | ORAL_TABLET | Freq: Two times a day (BID) | ORAL | Status: DC

## 2024-02-27 MED ORDER — MONTELUKAST SODIUM 10 MG PO TABS
10.0000 mg | ORAL_TABLET | Freq: Every day | ORAL | Status: DC
  Administered 2024-02-27 – 2024-03-08 (×11): 10 mg via ORAL
  Filled 2024-02-27 (×11): qty 1

## 2024-02-27 MED ORDER — AMIODARONE HCL IN DEXTROSE 360-4.14 MG/200ML-% IV SOLN
60.0000 mg/h | INTRAVENOUS | Status: AC
  Filled 2024-02-27: qty 200

## 2024-02-27 MED ORDER — ATORVASTATIN CALCIUM 80 MG PO TABS
80.0000 mg | ORAL_TABLET | Freq: Every day | ORAL | Status: DC
  Administered 2024-02-27 – 2024-03-09 (×12): 80 mg via ORAL
  Filled 2024-02-27 (×12): qty 1

## 2024-02-27 MED ORDER — MIDAZOLAM HCL 2 MG/2ML IJ SOLN
INTRAMUSCULAR | Status: AC
  Filled 2024-02-27: qty 2

## 2024-02-27 MED ORDER — BUDESONIDE 0.5 MG/2ML IN SUSP
2.0000 mL | Freq: Two times a day (BID) | RESPIRATORY_TRACT | Status: DC
  Administered 2024-02-27 – 2024-03-09 (×23): 0.5 mg via RESPIRATORY_TRACT
  Filled 2024-02-27 (×23): qty 2

## 2024-02-27 MED ORDER — AMIODARONE IV BOLUS ONLY 150 MG/100ML
150.0000 mg | Freq: Once | INTRAVENOUS | Status: AC
  Filled 2024-02-27: qty 100

## 2024-02-27 NOTE — Significant Event (Addendum)
 Rapid Response Event Note   Reason for Call :  Wide complex tachycardia, HR 200  Initial Focused Assessment:  Pt in bed, AO. Pale, diaphoretic. Was noted to be febrile, 101.91F, at 1300 VS check. Clear breath sounds, tachypneic. Endorsing dizziness and chest pain. Peripheral pulse regular, 1+.   VS: BP 90/74, HR 193, RR 29, SpO2 98% on 4LNC  Interventions:  1325: 5mg  IV Lopressor  being administered upon my arrival. EKG completed. Zoll pads applied.  1330: Dr. Loni at bedside. 150 mg IV Amio bolus, followed by infusion.  1331: CRNA and RT called to bedside for cardioversion. CRNA and RT promptly arrived to bedside.   1339: HR converted from a wide complex tachycardia to AF RVR, HR 150 bpm. Decision made to abort cardioversion.  1416: HR steadily increasing, now 176 bpm. AF RVR. 2nd 150mg  IV Amio bolus given, per Dr. Loni. 1430: Patient continued to complain of 9/10 chest pain in the center of his chest. Repeat EKG completed. EKG reviewed by Dr. Loni. Orders for Digoxin , CMP, and Troponin placed.   Plan of Care:  -Amio gtt -Digoxin  q8h -Frequent VS checks  Event Summary:  MD Notified: Dr. Loni, Dr. Davia Call Time: 1315 Arrival Time: 1320 End Time: 1450  Leonor LITTIE Danker, RN

## 2024-02-27 NOTE — Progress Notes (Signed)
 Chart reviewed, patient not seen.  Maintaining SR on telemetry.  Continue Xarelto . Follow up with CV at [facility], patient to arrange.   Cardiology will sign off.  Sivan Cuello A Dannell Gortney, MD

## 2024-02-27 NOTE — Progress Notes (Signed)
 PT placed on CPAP at this time w/ 3L.   02/27/24 2303  BiPAP/CPAP/SIPAP  BiPAP/CPAP/SIPAP Pt Type Adult  BiPAP/CPAP/SIPAP Resmed  Mask Type Full face mask  Dentures removed? Not applicable  Respiratory Rate 18 breaths/min  Flow Rate 3 lpm  Patient Home Machine No  Patient Home Mask No  Patient Home Tubing No  Auto Titrate No  Nasal massage performed Yes  CPAP/SIPAP surface wiped down Yes  Device Plugged into RED Power Outlet Yes

## 2024-02-27 NOTE — Significant Event (Signed)
 1309: Pt sitting up in chair unable to respond to this RN but alert. Visibly short of breath. HR 200's.  1313: Rapid response called. MD paged.  1325: Lopressor  5 mg IV given per MD verbal with readback. EKGs obtained. NS bolus given.  1333: amiodarone  bolus IV given, 2L.  1336: 50 mcg fentanyl  IV given in preparation for cardioversion. Rapid team, cardiology at bedside. Ambu bag applied. Cardioversion not performed per provider team as HR decreased and rhythm conversion to Afib with RVR.  1337: Amiodarone  bolus complete. Rate now 33.3 mL/hr.  1340: HR sustaining 120's-140's. NRB applied.  1343: Decision made by cardiology and rapid response team to keep patient on current unit.  1358: Pt reporting chest pain like someone punched me in the chest. HR 160's.  1407: HR continuing to increase sustaining 170's, Pt reports CP 9/10 like someone hit me in the chest. Bolusing amiodarine again per cardiologist at bedside. Digoxin  given IV (see MAR). Repeat EKG (see chart).  1500: Pt reporting chest pain 8/10 now, HR sustaining 110's-130's. NRB replaced with 2L [place], SPO2 92%. Patient resting with all needs met, call bell and personal belongings in reach.

## 2024-02-27 NOTE — Progress Notes (Signed)
 Chaplain responds to page of pt requesting visit. 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 He speaks of fear when his heart was trying to explode and he was struggling to breathe. He fears experiencing these feelings again and his loved ones witnessing him in that state. Chaplain normalizes and validates his fear while affirming his deep faith. He shares about the multiple serious illnesses he has and says he knows this only ends one way but he and Jesus have been tight for a very long time. Chaplain encourages sharing and life review and provides prayer. He asks that chaplain visit again tomorrow. Chaplain educates that another chaplain will be here and she will ask that chaplain to visit.

## 2024-02-27 NOTE — Progress Notes (Signed)
 [facility]                                                                              Sachit Rizzo, is a 73 y.o. male, DOB - 04-Oct-1950, FMW:[id number] Admit date - 02/25/2024    Outpatient Primary MD for the patient is [name], [name], MD  LOS - 1  days  Chief Complaint  Patient presents with   Palpitations   Chest Pain       Brief summary   Patient is a 73 year old male with paroxysmal atrial fibrillation on Xarelto , chronic HFpEF, COPD, CKD stage IIIb diabetes mellitus type 2, HTN, osteoporosis, CAD status post PCI, obesity, OSA on CPAP presented to ED with palpitations for 1 week.  He also reported chest pain, pressure with radiation down the left arm, associated shortness of breath, prior to presenting to ED, heart rate was in 130s In ED, patient was found to have heart rate 131, BP 105/53 and was placed on Cardizem  drip cardiology was consulted.  Assessment & Plan    Principal Problem:   Atrial fibrillation with rapid ventricular response (HCC) - Initially placed on Cardizem  drip  - Torsemide , Aldactone , metoprolol , Imdur  were held due to borderline BP, underwent DCCV on 9/12 - 2D echo showed EF of 45 to 50%, global hypokinesis, diastolic parameters indeterminate - Heart rate now controlled, continue Xarelto   Acute on chronic systolic and diastolic heart failure with volume overload - Patient noted to be short of breath today, torsemide  had been held on the admission due to borderline BP - Stat chest x-ray showed pulmonary vascular congestion, no infiltrates.  BNP on 9/11 had shown 2137, BNP pending this morning - Lasix  40 mg IV x 1, torsemide , Aldactone  resumed - Strict I's and O's and daily weights - Requested cardiology to follow  CAD status post PCI,  hypertension -Previous echo 12/2023 had shown EF of 55 to 60%, indeterminate left ventricular diastolic parameters, global hypokinesis, moderate asymmetric LVH of the septal segment.  Mild to  moderate aortic stenosis - Torsemide , Aldactone , metoprolol , Imdur  resumed - Continue Plavix , Xarelto    COPD with mild exacerbation - Continue Pulmicort , Brovana , Xopenex  nebs - Chest x-ray showed no infiltrates.    Insulin -requiring or dependent type II diabetes mellitus (HCC) -On high doses of insulin  outpatient, last A1c 6.7  CBG (last 3)  Recent Labs    02/26/24 2110 02/27/24 0600 02/27/24 1113  GLUCAP 203* 196* 233*   CBGs elevated, placed on Lantus  20 units daily, NovoLog  meal coverage 4 units 3 times daily AC, sliding scale insulin , sensitive    Anemia due to stage 3b chronic kidney disease (HCC) -H&H currently stable.  Baseline hemoglobin 9-10  CKD stage IIIb - Baseline creatinine 1.4-1.7 likely worsened due to A-fib with RVR, now on diuresis  - Creatinine still close to baseline  Hyperlipidemia - Resume statin   Obesity class II Estimated body mass index is 39.29 kg/m as calculated from the following:   Height as of this encounter: 5' 10 (1.778 m).   Weight as of this encounter: 124.2 kg.  Code Status: Full code DVT Prophylaxis:   rivaroxaban  (XARELTO ) tablet 20  mg   Level of Care: Level of care: Progressive Family Communication: Updated patient Disposition Plan:      Remains inpatient appropriate:   Possible DC home tomorrow   Procedures:  2D echo DCCV  Consultants:   Cardiology  Antimicrobials:   Anti-infectives (From admission, onward)    None          Medications  arformoterol   15 mcg Nebulization BID   atorvastatin   80 mg Oral Daily   budesonide   2 mL Nebulization BID   clopidogrel   75 mg Oral Daily   donepezil   10 mg Oral QHS   insulin  aspart  0-9 Units Subcutaneous TID WC   isosorbide  mononitrate  30 mg Oral Daily   latanoprost   1 drop Both Eyes QHS   levalbuterol   0.63 mg Nebulization Q6H   memantine   10 mg Oral BID   metoprolol  tartrate  12.5 mg Oral BID   mirabegron  ER  50 mg Oral Daily   montelukast   10 mg Oral QHS    rivaroxaban   20 mg Oral Q supper   sertraline   100 mg Oral Daily   torsemide   20 mg Oral BID      Subjective:   Elsworth Chuang was seen and examined today.  Feeling short of breath, no acute chest pain, no fever chills, productive cough.  Mild wheezing.  Heart rate controlled.  Objective:   Vitals:   02/27/24 0828 02/27/24 0831 02/27/24 0904 02/27/24 1049  BP:   (!) 143/63 139/75  Pulse:   85 93  Resp:   (!) 24   Temp:   100.1 F (37.8 C)   TempSrc:   Oral   SpO2: 99% 97%    Weight:      Height:        Intake/Output Summary (Last 24 hours) at 02/27/2024 1120 Last data filed at 02/27/2024 1103 Gross per 24 hour  Intake 240 ml  Output 2325 ml  Net -2085 ml     Wt Readings from Last 3 Encounters:  02/27/24 124.2 kg  01/05/24 117.9 kg  08/26/23 116 kg   Physical Exam General: Alert and oriented x 3, NAD Cardiovascular: Systolic ejection murmur Respiratory: Diminished BS at the bases and mild wheezing Gastrointestinal: Soft, nontender, nondistended, NBS Ext: Trace pedal edema bilaterally Neuro: no new deficits Psych: Normal affect   Data Reviewed:  I have personally reviewed following labs    CBC Lab Results  Component Value Date   WBC 17.7 (H) 02/27/2024   RBC 4.59 02/27/2024   HGB 10.1 (L) 02/27/2024   HCT 34.4 (L) 02/27/2024   MCV 74.9 (L) 02/27/2024   MCH 22.0 (L) 02/27/2024   PLT 342 02/27/2024   MCHC 29.4 (L) 02/27/2024   RDW 17.3 (H) 02/27/2024   LYMPHSABS 1.1 08/25/2023   MONOABS 0.8 08/25/2023   EOSABS 0.2 08/25/2023   BASOSABS 0.1 08/25/2023     Last metabolic panel Lab Results  Component Value Date   NA 139 02/27/2024   K 4.0 02/27/2024   CL 106 02/27/2024   CO2 21 (L) 02/27/2024   BUN 19 02/27/2024   CREATININE 1.63 (H) 02/27/2024   GLUCOSE 202 (H) 02/27/2024   GFRNONAA 44 (L) 02/27/2024   GFRAA 53 (L) 09/13/2019   CALCIUM  8.4 (L) 02/27/2024   PHOS 2.5 02/27/2024   PROT 6.3 (L) 02/26/2024   ALBUMIN  3.2 (L) 02/27/2024    BILITOT 0.7 02/26/2024   ALKPHOS 82 02/26/2024   AST 18 02/26/2024   ALT 20 02/26/2024  ANIONGAP 12 02/27/2024    CBG (last 3)  Recent Labs    02/26/24 2110 02/27/24 0600 02/27/24 1113  GLUCAP 203* 196* 233*      Coagulation Profile: No results for input(s): INR, PROTIME in the last 168 hours.   Radiology Studies: I have personally reviewed the imaging studies  DG CHEST PORT 1 VIEW Result Date: 02/27/2024 EXAM: 1 VIEW XRAY OF THE CHEST 02/27/2024 08:02:00 AM COMPARISON: 01/21/2024 CLINICAL HISTORY: Dyspnea FINDINGS: LUNGS AND PLEURA: Pulmonary vascular congestion without frank edema. No focal pulmonary opacity. No pleural effusion. No pneumothorax. HEART AND MEDIASTINUM: No acute abnormality of the cardiac and mediastinal silhouettes. Aortic atherosclerosis. BONES AND SOFT TISSUES: Cervical spine fusion hardware. No acute osseous abnormality. IMPRESSION: 1. Pulmonary vascular congestion without frank edema. Electronically signed by: Waddell Calk MD 02/27/2024 08:22 AM EDT RP Workstation: [id number]   EP STUDY Result Date: 02/26/2024 See surgical note for result.  CT Angio Chest/Abd/Pel for Dissection W and/or Wo Contrast Addendum Date: 02/26/2024 ADDENDUM REPORT: 02/26/2024 15:08 ADDENDUM: There is a single new 3 mm right upper lobe nodule image 16/66. No additional follow-up recommended. Electronically Signed   By: Greig Pique M.D.   On: 02/26/2024 15:08   Result Date: 02/26/2024 CLINICAL DATA:  Evaluate for dissection. EXAM: CT ANGIOGRAPHY CHEST, ABDOMEN AND PELVIS TECHNIQUE: Noncontrast chest CT was performed. Multidetector CT imaging through the chest, abdomen and pelvis was performed using the standard protocol during bolus administration of intravenous contrast. Multiplanar reconstructed images and MIPs were obtained and reviewed to evaluate the vascular anatomy. RADIATION DOSE REDUCTION: This exam was performed according to the departmental dose-optimization program  which includes automated exposure control, adjustment of the mA and/or kV according to patient size and/or use of iterative reconstruction technique. CONTRAST:  OMNIPAQUE  IOHEXOL  350 MG/ML SOLN COMPARISON:  CT PE protocol 08/24/2023. CT chest abdomen and pelvis 01/30/2023. CT chest 01/26/2020. FINDINGS: CTA CHEST FINDINGS Cardiovascular: Aorta is normal in size. There is no evidence for aortic dissection or aneurysm. Origin of the great vessels appears patent. Aberrant right subclavian artery present. There are mild calcified atherosclerotic plaque throughout the aorta. The heart is mildly enlarged. There is no pericardial effusion. There are atherosclerotic calcifications of the coronary arteries. Mediastinum/Nodes: No enlarged mediastinal, hilar, or axillary lymph nodes. Thyroid gland, trachea, and esophagus demonstrate no significant findings. Lungs/Pleura: Right lower lobe nodular density peripherally measures 7 mm image 6/87, unchanged. There is also stable right lower lobe nodule image 6/81 measuring 5 mm. These are unchanged from 2021 and favored as benign. The lungs are otherwise clear. There is no pleural effusion or pneumothorax. Musculoskeletal: Cervical spinal fusion plate is present. There is T10 compression fracture which appears acute or subacute and has progressed compared to the prior study. Review of the MIP images confirms the above findings. CTA ABDOMEN AND PELVIS FINDINGS VASCULAR Aorta: Normal caliber aorta without aneurysm, dissection, vasculitis or significant stenosis. Mild calcified atherosclerotic disease present. Celiac: Patent without evidence of aneurysm, dissection, vasculitis or significant stenosis. SMA: Patent without evidence of aneurysm, dissection, vasculitis or significant stenosis. Renals: Both renal arteries are patent without evidence of aneurysm, dissection, vasculitis, fibromuscular dysplasia or significant stenosis. IMA: Patent without evidence of aneurysm,  dissection, vasculitis or significant stenosis. Inflow: Patent without evidence of aneurysm, dissection, vasculitis or significant stenosis. Veins: No obvious venous abnormality within the limitations of this arterial phase study. Review of the MIP images confirms the above findings. NON-VASCULAR Hepatobiliary: No focal liver abnormality is seen. No gallstones, gallbladder wall thickening, or  biliary dilatation. Pancreas: Unremarkable. No pancreatic ductal dilatation or surrounding inflammatory changes. Spleen: Normal in size without focal abnormality. Adrenals/Urinary Tract: Bilateral renal cysts are present. The largest is in the superior pole of the left kidney measuring 3.3 cm. There is no hydronephrosis or perinephric fluid. Adrenal glands and bladder are within normal limits. Stomach/Bowel: Stomach is within normal limits. Appendix appears normal. No evidence of bowel wall thickening, distention, or inflammatory changes. Lymphatic: No enlarged lymph nodes are seen. Reproductive: TURP defect again noted. Other: There is a small fat containing umbilical hernia. There is no ascites. Musculoskeletal: Prominent Schmorl's node of L3 is unchanged. Review of the MIP images confirms the above findings. IMPRESSION: 1. No evidence for aortic dissection or aneurysm. 2. No acute cardiopulmonary process. 3. T10 compression fracture has progressed compared to the prior study. 4. No acute localizing process in the abdomen or pelvis. Aortic Atherosclerosis (ICD10-I70.0). Electronically Signed: By: Greig Pique M.D. On: 02/25/2024 16:42   ECHOCARDIOGRAM COMPLETE Result Date: 02/26/2024    ECHOCARDIOGRAM REPORT   Patient Name:   Zayvian C Kritikos Date of Exam: 02/26/2024 Medical Rec #:  [id number]     Height:       70.0 in Accession #:    [id number]    Weight:       275.1 lb Date of Birth:  [birth year]      BSA:          2.390 m Patient Age:    73 years      BP:           111/75 mmHg Patient Gender: M             HR:           87  bpm. Exam Location:  Inpatient Procedure: 2D Echo and Intracardiac Opacification Agent (Both Spectral and Color            Flow Doppler were utilized during procedure). Indications:     Dyspnea  History:         Patient has prior history of Echocardiogram examinations.                  Aortic Valve Disease, Arrythmias:Atrial Fibrillation;                  Signs/Symptoms:Dyspnea.  Sonographer:     Charmaine Gaskins Referring Phys:  [id number] A THOMAS Diagnosing Phys: Lonni Nanas MD IMPRESSIONS  1. Left ventricular ejection fraction, by estimation, is 45 to 50%. The left ventricle has mildly decreased function. The left ventricle demonstrates global hypokinesis. The left ventricular internal cavity size was mildly dilated. Left ventricular diastolic parameters are indeterminate.  2. Right ventricular systolic function is normal. The right ventricular size is normal. There is mildly elevated pulmonary artery systolic pressure. The estimated right ventricular systolic pressure is 37.8 mmHg.  3. Left atrial size was mildly dilated.  4. The mitral valve is normal in structure. Mild mitral valve regurgitation.  5. The aortic valve is calcified. Aortic valve regurgitation is not visualized. Mild to moderate aortic valve stenosis. Vmax 2.8 m/s, MG 21 mmHg, AVA 1.6 cm^2, DI 0.45  6. The inferior vena cava is normal in size with greater than 50% respiratory variability, suggesting right atrial pressure of 3 mmHg. FINDINGS  Left Ventricle: Left ventricular ejection fraction, by estimation, is 45 to 50%. The left ventricle has mildly decreased function. The left ventricle demonstrates global hypokinesis. Definity  contrast agent was given IV to delineate the left ventricular  endocardial borders. The left ventricular internal cavity size was mildly dilated. There is no left ventricular hypertrophy. Left ventricular diastolic parameters are indeterminate. Right Ventricle: The right ventricular size is normal. No  increase in right ventricular wall thickness. Right ventricular systolic function is normal. There is mildly elevated pulmonary artery systolic pressure. The tricuspid regurgitant velocity is 2.95  m/s, and with an assumed right atrial pressure of 3 mmHg, the estimated right ventricular systolic pressure is 37.8 mmHg. Left [facility]: Left atrial size was mildly dilated. Right [facility]: Right atrial size was normal in size. Pericardium: Trivial pericardial effusion is present. Mitral Valve: The mitral valve is normal in structure. Mild mitral valve regurgitation. Tricuspid Valve: The tricuspid valve is normal in structure. Tricuspid valve regurgitation is mild. Aortic Valve: The aortic valve is calcified. Aortic valve regurgitation is not visualized. Mild to moderate aortic stenosis is present. Aortic valve mean gradient measures 18.6 mmHg. Aortic valve peak gradient measures 27.5 mmHg. Aortic valve area, by VTI measures 1.39 cm. Pulmonic Valve: The pulmonic valve was not well visualized. Pulmonic valve regurgitation is not visualized. Aorta: The aortic root and ascending aorta are structurally normal, with no evidence of dilitation. Venous: The inferior vena cava is normal in size with greater than 50% respiratory variability, suggesting right atrial pressure of 3 mmHg. IAS/Shunts: The atrial septum is grossly normal.  LEFT VENTRICLE PLAX 2D LVIDd:         6.00 cm      Diastology LVIDs:         4.60 cm      LV e' medial:    7.87 cm/s LV PW:         0.90 cm      LV E/e' medial:  14.2 LV IVS:        1.00 cm      LV e' lateral:   8.01 cm/s LVOT diam:     2.20 cm      LV E/e' lateral: 13.9 LV SV:         77 LV SV Index:   32 LVOT Area:     3.80 cm  LV Volumes (MOD) LV vol d, MOD A2C: 122.0 ml LV vol d, MOD A4C: 121.0 ml LV vol s, MOD A2C: 43.7 ml LV vol s, MOD A4C: 76.9 ml LV SV MOD A2C:     78.3 ml LV SV MOD A4C:     121.0 ml LV SV MOD BP:      64.1 ml RIGHT VENTRICLE RV Basal diam:  3.50 cm RV Mid diam:    3.60 cm RV S  prime:     11.20 cm/s LEFT ATRIUM             Index        RIGHT ATRIUM           Index LA diam:        4.80 cm 2.01 cm/m   RA Area:     19.10 cm LA Vol (A2C):   89.8 ml 37.57 ml/m  RA Volume:   47.80 ml  20.00 ml/m LA Vol (A4C):   87.9 ml 36.77 ml/m LA Biplane Vol: 91.9 ml 38.45 ml/m  AORTIC VALVE AV Area (Vmax):    1.52 cm AV Area (Vmean):   1.39 cm AV Area (VTI):     1.39 cm AV Vmax:           262.40 cm/s AV Vmean:          209.000 cm/s  AV VTI:            0.554 m AV Peak Grad:      27.5 mmHg AV Mean Grad:      18.6 mmHg LVOT Vmax:         104.92 cm/s LVOT Vmean:        76.200 cm/s LVOT VTI:          0.202 m LVOT/AV VTI ratio: 0.36  AORTA Ao Asc diam: 3.40 cm MITRAL VALVE                TRICUSPID VALVE MV Area (PHT): 4.70 cm     TR Peak grad:   34.8 mmHg MV Decel Time: 161 msec     TR Vmax:        295.00 cm/s MV E velocity: 111.67 cm/s                             SHUNTS                             Systemic VTI:  0.20 m                             Systemic Diam: 2.20 cm Lonni Nanas MD Electronically signed by Lonni Nanas MD Signature Date/Time: 02/26/2024/2:34:07 PM    Final (Updated)        Nydia Distance M.D. [facility] 02/27/2024, 11:20 AM  Available via Epic secure chat 7am-7pm After 7 pm, please refer to night coverage provider listed on amion.

## 2024-02-27 NOTE — Progress Notes (Signed)
 Called urgently to the bedside for wide complex tachycardia with hemodynamic instability and respiratory decompensation. Presented immediately.  Patient noted to be in wide-complex tachycardia heart rate 180 on presentation with blood pressure 80s over 60s and subsequently 70s over 50s.  EKG obtained, highly concerning for ventricular arrhythmia, amiodarone  bolus administered 150 mg, sedation arranged and plan for cardioversion.  Anesthesia contacted for airway support in the event of decompensation given concerns of volume overload by primary service this morning.  After amiodarone  bolus and administration of fentanyl , with oxygenation by anesthesia, patient broke wide-complex tachycardia and returned to a narrow complex irregular rapid atrial fibrillation heart rate 150.  Amiodarone  infusion continued.  Patient experienced 10 out of 10 chest pain, EKG showed mild ST depressions in precordial leads.  Laboratory studies ordered including troponin and are pending patient has a history of coronary artery disease revascularized last in May with stents to the mid LAD.  Suspect demand ischemia from rapid rates and hypotension.  Approximately 10 minutes later patient had recurrence of heart rates in the 180s and 190s with 10 out of 10 chest pain.  Repeat EKG performed with improvement in the ST depressions suggesting chest pain is likely related to demand and elevated heart rates.  At this point decision made to continue amiodarone  and load digoxin .  Pharmacist contacted for dosing instructions with renal dysfunction, we will administer digoxin  for the benefit of his heart rate and monitor for improvement in chest pain.  I contacted the wife twice and was able to get through the second time and discussed with her for approximately 5 minutes his current condition and answered all questions to the best my ability.  Primary service contacted with updated recommendations.  GEN: in respiratory distress Neck: No  JVD Cardiac: RRR, 1/6 systolic murmurs Respiratory: shallow breathing GI: Soft, nontender, non-distended  MS: puffy 1+ edema; No deformity. Neuro:  Nonfocal  Psych: Normal affect    CRITICAL CARE Performed by: Soyla Merck, MD   Total critical care time: 45 minutes   Critical care time was exclusive of separately billable procedures and treating other patients.   Critical care was necessary to treat or prevent imminent or life-threatening deterioration.   Critical care was time spent personally by me (independent of APPs or residents) on the following activities: development of treatment plan with patient and/or surrogate as well as nursing, discussions with consultants, evaluation of patient's response to treatment, examination of patient, obtaining history from patient or surrogate, ordering and performing treatments and interventions, ordering and review of laboratory studies, ordering and review of radiographic studies, pulse oximetry and re-evaluation of patient's condition.

## 2024-02-28 DIAGNOSIS — J9690 Respiratory failure, unspecified, unspecified whether with hypoxia or hypercapnia: Secondary | ICD-10-CM | POA: Diagnosis not present

## 2024-02-28 DIAGNOSIS — R000 Tachycardia, unspecified: Secondary | ICD-10-CM | POA: Diagnosis not present

## 2024-02-28 DIAGNOSIS — R110 Nausea: Secondary | ICD-10-CM | POA: Diagnosis not present

## 2024-02-28 DIAGNOSIS — I4892 Unspecified atrial flutter: Secondary | ICD-10-CM | POA: Diagnosis not present

## 2024-02-28 DIAGNOSIS — R079 Chest pain, unspecified: Secondary | ICD-10-CM | POA: Diagnosis not present

## 2024-02-28 DIAGNOSIS — I4891 Unspecified atrial fibrillation: Secondary | ICD-10-CM | POA: Diagnosis not present

## 2024-02-28 LAB — GLUCOSE, CAPILLARY
Glucose-Capillary: 201 mg/dL — ABNORMAL HIGH (ref 70–99)
Glucose-Capillary: 246 mg/dL — ABNORMAL HIGH (ref 70–99)
Glucose-Capillary: 222 mg/dL — ABNORMAL HIGH (ref 70–99)
Glucose-Capillary: 246 mg/dL — ABNORMAL HIGH (ref 70–99)

## 2024-02-28 LAB — CBC
HCT: 31.1 % — ABNORMAL LOW (ref 39.0–52.0)
Hemoglobin: 9.2 g/dL — ABNORMAL LOW (ref 13.0–17.0)
MCH: 21.7 pg — ABNORMAL LOW (ref 26.0–34.0)
MCHC: 29.6 g/dL — ABNORMAL LOW (ref 30.0–36.0)
MCV: 73.5 fL — ABNORMAL LOW (ref 80.0–100.0)
Platelets: 282 K/uL (ref 150–400)
RBC: 4.23 MIL/uL (ref 4.22–5.81)
RDW: 17.5 % — ABNORMAL HIGH (ref 11.5–15.5)
WBC: 18.3 K/uL — ABNORMAL HIGH (ref 4.0–10.5)
nRBC: 0 % (ref 0.0–0.2)

## 2024-02-28 LAB — RENAL FUNCTION PANEL
Albumin: 3 g/dL — ABNORMAL LOW (ref 3.5–5.0)
Anion gap: 15 (ref 5–15)
BUN: 22 mg/dL (ref 8–23)
CO2: 21 mmol/L — ABNORMAL LOW (ref 22–32)
Calcium: 8.4 mg/dL — ABNORMAL LOW (ref 8.9–10.3)
Chloride: 102 mmol/L (ref 98–111)
Creatinine, Ser: 1.83 mg/dL — ABNORMAL HIGH (ref 0.61–1.24)
GFR, Estimated: 38 mL/min — ABNORMAL LOW (ref >60)
Glucose, Bld: 214 mg/dL — ABNORMAL HIGH (ref 70–99)
Phosphorus: 2.2 mg/dL — ABNORMAL LOW (ref 2.5–4.6)
Potassium: 3.7 mmol/L (ref 3.5–5.1)
Sodium: 138 mmol/L (ref 135–145)

## 2024-02-28 LAB — TROPONIN I (HIGH SENSITIVITY): Troponin I (High Sensitivity): 23 ng/L — ABNORMAL HIGH (ref <18)

## 2024-02-28 LAB — PROCALCITONIN: Procalcitonin: 0.71 ng/mL

## 2024-02-28 MED ORDER — SODIUM CHLORIDE 0.9 % IV SOLN
3.0000 g | Freq: Four times a day (QID) | INTRAVENOUS | Status: DC
  Administered 2024-02-28 – 2024-03-01 (×9): 3 g via INTRAVENOUS
  Filled 2024-02-28 (×9): qty 8

## 2024-02-28 MED ORDER — INSULIN GLARGINE 100 UNIT/ML ~~LOC~~ SOLN
30.0000 [IU] | SUBCUTANEOUS | Status: DC
  Administered 2024-02-28: 30 [IU] via SUBCUTANEOUS
  Filled 2024-02-28 (×2): qty 0.3

## 2024-02-28 MED ORDER — INSULIN ASPART 100 UNIT/ML IJ SOLN
5.0000 [IU] | Freq: Three times a day (TID) | INTRAMUSCULAR | Status: DC
  Administered 2024-02-28 – 2024-02-29 (×4): 5 [IU] via SUBCUTANEOUS

## 2024-02-28 NOTE — Progress Notes (Signed)
  Progress Note Patient Name: Mike Hunter Date of Encounter: 02/28/2024 [facility] Cardiologist: None [facility]   Interval Summary   Remains in afib but rates 140s, narrow complex.  Chest pain but it was out of proportion to trops, trops only 27 > 30.   Vital Signs Vitals:   02/28/24 0500 02/28/24 0600 02/28/24 0710 02/28/24 0728  BP: 115/78 114/72 113/73 120/79  Pulse:   88 88  Resp: (!) 24 16 (!) 32 (!) 24  Temp:   98.4 F (36.9 C)   TempSrc:   Oral   SpO2: 94% 96% 90% 95%  Weight:      Height:        Intake/Output Summary (Last 24 hours) at 02/28/2024 0941 Last data filed at 02/28/2024 9071 Gross per 24 hour  Intake 720 ml  Output 2225 ml  Net -1505 ml      02/28/2024    4:30 AM 02/27/2024    3:00 AM 02/26/2024   12:15 AM  Last 3 Weights  Weight (lbs) 274 lb 14.6 oz 273 lb 13 oz 275 lb 2.2 oz  Weight (kg) 124.7 kg 124.2 kg 124.8 kg      Telemetry/ECG  Afib rates 140s - Personally Reviewed  Physical Exam  GEN: No acute distress.   Neck: No JVD Cardiac: iRRR, 2/6 sem Respiratory: Clear to auscultation bilaterally. GI: Soft, nontender, non-distended  MS: No edema  Assessment & Plan  #WCT, hemodynamically unstable - 02/27/24 at around 1309 onset. Initially very concerning for ventricular tachycardia with rates 180s-190s, with history of CAD. However, when it broke after about 20 minutes, he was in NCT afib RVR rates 150s. It's possible this was atrial flutter with rate related aberrancy, he maintained conciousness and a pulse throughout. At this point, with failed cardioversion and unstable WCT and continued afib, I would like EP to weigh in. Will have them see tomorrow. -continue amiodarone  IV. - during rapid reponse, patient had a second episode of unstable tachycardia, NCT rates 190s with afib rvr and mild hypotension. Digoxin  loaded at that time. Unfortunately, I'm not sure how much that is helping with rate control. Load completed, will have EP weigh in  on next steps. Lytes are stable. Level will be coordinated by pharmacy.   #Afib rvr - on amiodarone  - on DOAC - previously on amiodarone  but stopped in past, now best option given hypotension and need to maintain SR.  - did get cardioverted but had recurrence of AF.   #AKI on CKD - cr may be elevated due to prolonged hypotension during WCT yesterday. He has already been given his lasix . Hold remaining doses and observe renal function.   #CAD s/p PCI with DES x 2 to mLAD #Hyperlipidemia Received PCI with DES x 2 to the LAD on 10/2023 at [facility].  No need for aspirin  as it is on Xarelto  for atrial fibrillation - Continue Plavix  75 mg daily - atorvastatin  80 mg daily. - had chest pain during rapid response 9/13, however trops after episoe 27>30 and EKG improved despite chest pain. Likely demand related. Observe.     For questions or updates, please contact [facility] Please consult [email or web address] for contact info under         Signed, Mike Hlavac A Nalany Steedley, MD

## 2024-02-28 NOTE — Progress Notes (Signed)
 Patient's wife would like an update from the attending at their home phone #.

## 2024-02-28 NOTE — Progress Notes (Signed)
   02/28/24 1700  Spiritual Encounters  Type of Visit Follow up  Care provided to: Patient  Referral source Chaplain team  Reason for visit Routine spiritual support  OnCall Visit Yes  Spiritual Framework  Presenting Themes Impactful experiences and emotions  Community/Connection Faith community  Patient Stress Factors Health changes  Interventions  Spiritual Care Interventions Made Established relationship of care and support;Compassionate presence;Reflective listening;Prayer;Encouragement  Intervention Outcomes  Outcomes Connection to spiritual care;Awareness of health;Awareness of support;Reduced isolation  Spiritual Care Plan  Spiritual Care Issues Still Outstanding No further spiritual care needs at this time (see row info)   Chaplain spiritual support services remain available as the need arises.

## 2024-02-28 NOTE — Progress Notes (Addendum)
 [facility]                                                                              Mike Hunter, is a 73 y.o. male, DOB - 1950/11/30, FMW:[id number] Admit date - 02/25/2024    Outpatient Primary MD for the patient is Mike Hunter, [name], MD  LOS - 2  days  Chief Complaint  Patient presents with   Palpitations   Chest Pain       Brief summary   Patient is a 73 year old male with paroxysmal atrial fibrillation on Xarelto , chronic HFpEF, COPD, CKD stage IIIb diabetes mellitus type 2, HTN, osteoporosis, CAD status post PCI, obesity, OSA on CPAP presented to ED with palpitations for 1 week.  He also reported chest pain, pressure with radiation down the left arm, associated shortness of breath, prior to presenting to ED, heart rate was in 130s In ED, patient was found to have heart rate 131, BP 105/53 and was placed on Cardizem  drip cardiology was consulted.  Assessment & Plan    Principal Problem:   Atrial fibrillation with rapid ventricular response (HCC) - Initially placed on Cardizem  drip  - Torsemide , Aldactone , metoprolol , Imdur  were held due to borderline BP, underwent DCCV on 9/12 - 2D echo showed EF of 45 to 50%, global hypokinesis, diastolic parameters indeterminate - Continue Xarelto  - Cardiology following, previously on amiodarone , had stopped in the past, now on amiodarone  drip.  Had a DCCV on 9/12 but had a reccurrence of AF postprocedure  Wide-complex tachycardia, hemodynamically unstable, chest pain - On 9/13, patient had sudden onset of V. tach, rate 180s to 190s, seen by cardiology urgently and was given IV amiodarone  bolus x2, placed on amiodarone  drip - Further management per EP cardiology, heart rate currently controlled  Acute on chronic systolic and diastolic heart failure with volume overload - Noted to be in volume overload on 9/13, torsemide  and Aldactone  had been held on the admission due to borderline BP - Received IV Lasix  40 mg twice  daily on 9/13, 1 dose this morning, creatinine trended up to 1.8 today, will hold  - Strict I's and O's and daily weights.  Negative balance of 2.4 L - Cardiology following  CAD status post PCI,  hypertension -Previous echo 12/2023 had shown EF of 55 to 60%, indeterminate left ventricular diastolic parameters, global hypokinesis, moderate asymmetric LVH of the septal segment.  Mild to moderate aortic stenosis -  Continue Plavix , Xarelto , cardiology following   COPD with mild exacerbation Leukocytosis, acute bronchitis -Patient noted to be somewhat wheezing  -Continue Pulmicort , Brovana , Xopenex  nebs - Procalcitonin 0.71, WBCs trended up to 18.3  - added IV Unasyn , follow blood cultures     Insulin -requiring or dependent type II diabetes mellitus (HCC), uncontrolled with hyperglycemia  -On high doses of insulin  outpatient, last A1c 6.7  CBG (last 3)  Recent Labs    02/27/24 1546 02/27/24 2115 02/28/24 0627  GLUCAP 295* 228* 201*   CBGs elevated, increase Lantus  to 30 units daily, NovoLog  meal coverage 5 units 3 times daily AC, sensitive SSI Carb modified diet    Anemia due to stage 3b chronic  kidney disease (HCC) -H&H currently stable.  Baseline hemoglobin 9-10  Mild acute on CKD stage IIIb - Baseline creatinine 1.4-1.7 likely worsened due to A-fib with RVR, now on diuresis  - Creatinine trended up to 1.8 today likely due to IV Lasix   Hyperlipidemia - Resume statin   Obesity class II Estimated body mass index is 39.45 kg/m as calculated from the following:   Height as of this encounter: 5' 10 (1.778 m).   Weight as of this encounter: 124.7 kg.  Code Status: Full code DVT Prophylaxis:   rivaroxaban  (XARELTO ) tablet 20 mg   Level of Care: Level of care: Progressive Family Communication: Called patient's wife on the home phone and cell phone, unable to make contact, left a detailed message on the cell phone for the update. Disposition Plan:      Remains inpatient  appropriate:      Procedures:  2D echo DCCV  Consultants:   Cardiology  Antimicrobials:   Anti-infectives (From admission, onward)    Start     Dose/Rate Route Frequency Ordered Stop   02/28/24 0945  Ampicillin -Sulbactam (UNASYN ) 3 g in sodium chloride  0.9 % 100 mL IVPB        3 g 200 mL/hr over 30 Minutes Intravenous Every 6 hours 02/28/24 0846            Medications  arformoterol   15 mcg Nebulization BID   atorvastatin   80 mg Oral Daily   budesonide   2 mL Nebulization BID   clopidogrel   75 mg Oral Daily   donepezil   10 mg Oral QHS   insulin  aspart  0-9 Units Subcutaneous TID WC   insulin  aspart  4 Units Subcutaneous TID WC   insulin  glargine  20 Units Subcutaneous Q24H   isosorbide  mononitrate  30 mg Oral Daily   latanoprost   1 drop Both Eyes QHS   levalbuterol   0.63 mg Nebulization Q6H   memantine   10 mg Oral BID   metoprolol  tartrate  12.5 mg Oral BID   mirabegron  ER  50 mg Oral Daily   montelukast   10 mg Oral QHS   rivaroxaban   20 mg Oral Q supper   sertraline   100 mg Oral Daily   spironolactone   25 mg Oral Daily      Subjective:   Semir Wellbrock was seen and examined today.  Feeling better today however still has some wheezing.  No fevers or chills, chest pain.  on amiodarone  drip   Objective:   Vitals:   02/28/24 0500 02/28/24 0600 02/28/24 0710 02/28/24 0728  BP: 115/78 114/72 113/73 120/79  Pulse:   88 88  Resp: (!) 24 16 (!) 32 (!) 24  Temp:   98.4 F (36.9 C)   TempSrc:   Oral   SpO2: 94% 96% 90% 95%  Weight:      Height:        Intake/Output Summary (Last 24 hours) at 02/28/2024 1059 Last data filed at 02/28/2024 9071 Gross per 24 hour  Intake 720 ml  Output 1975 ml  Net -1255 ml     Wt Readings from Last 3 Encounters:  02/28/24 124.7 kg  01/05/24 117.9 kg  08/26/23 116 kg   Physical Exam General: Alert and oriented x 3, NAD Cardiovascular: Irregular, 2/6 SEM Respiratory: Mild expiratory wheezing Gastrointestinal: Soft,  nontender, nondistended, NBS Ext: no pedal edema bilaterally Neuro: no new deficits Psych: Normal affect   Data Reviewed:  I have personally reviewed following labs    CBC Lab Results  Component Value Date   WBC 18.3 (H) 02/28/2024   RBC 4.23 02/28/2024   HGB 9.2 (L) 02/28/2024   HCT 31.1 (L) 02/28/2024   MCV 73.5 (L) 02/28/2024   MCH 21.7 (L) 02/28/2024   PLT 282 02/28/2024   MCHC 29.6 (L) 02/28/2024   RDW 17.5 (H) 02/28/2024   LYMPHSABS 1.1 08/25/2023   MONOABS 0.8 08/25/2023   EOSABS 0.2 08/25/2023   BASOSABS 0.1 08/25/2023     Last metabolic panel Lab Results  Component Value Date   NA 138 02/28/2024   K 3.7 02/28/2024   CL 102 02/28/2024   CO2 21 (L) 02/28/2024   BUN 22 02/28/2024   CREATININE 1.83 (H) 02/28/2024   GLUCOSE 214 (H) 02/28/2024   GFRNONAA 38 (L) 02/28/2024   GFRAA 53 (L) 09/13/2019   CALCIUM  8.4 (L) 02/28/2024   PHOS 2.2 (L) 02/28/2024   PROT 6.7 02/27/2024   ALBUMIN  3.0 (L) 02/28/2024   BILITOT 1.2 02/27/2024   ALKPHOS 80 02/27/2024   AST 17 02/27/2024   ALT 19 02/27/2024   ANIONGAP 15 02/28/2024    CBG (last 3)  Recent Labs    02/27/24 1546 02/27/24 2115 02/28/24 0627  GLUCAP 295* 228* 201*      Coagulation Profile: No results for input(s): INR, PROTIME in the last 168 hours.   Radiology Studies: I have personally reviewed the imaging studies  DG CHEST PORT 1 VIEW Result Date: 02/27/2024 EXAM: 1 VIEW XRAY OF THE CHEST 02/27/2024 08:02:00 AM COMPARISON: 01/21/2024 CLINICAL HISTORY: Dyspnea FINDINGS: LUNGS AND PLEURA: Pulmonary vascular congestion without frank edema. No focal pulmonary opacity. No pleural effusion. No pneumothorax. HEART AND MEDIASTINUM: No acute abnormality of the cardiac and mediastinal silhouettes. Aortic atherosclerosis. BONES AND SOFT TISSUES: Cervical spine fusion hardware. No acute osseous abnormality. IMPRESSION: 1. Pulmonary vascular congestion without frank edema. Electronically signed by: Waddell Calk MD 02/27/2024 08:22 AM EDT RP Workstation: [id number]   EP STUDY Result Date: 02/26/2024 See surgical note for result.  ECHOCARDIOGRAM COMPLETE Result Date: 02/26/2024    ECHOCARDIOGRAM REPORT   Patient Name:   Mike Hunter Date of Exam: 02/26/2024 Medical Rec #:  [id number]     Height:       70.0 in Accession #:    [id number]    Weight:       275.1 lb Date of Birth:  29-Apr-1951      BSA:          2.390 m Patient Age:    73 years      BP:           111/75 mmHg Patient Gender: M             HR:           87 bpm. Exam Location:  Inpatient Procedure: 2D Echo and Intracardiac Opacification Agent (Both Spectral and Color            Flow Doppler were utilized during procedure). Indications:     Dyspnea  History:         Patient has prior history of Echocardiogram examinations.                  Aortic Valve Disease, Arrythmias:Atrial Fibrillation;                  Signs/Symptoms:Dyspnea.  Sonographer:     Charmaine Gaskins Referring Phys:  [id number] A THOMAS Diagnosing Phys: Lonni Nanas MD IMPRESSIONS  1. Left ventricular ejection fraction, by estimation,  is 45 to 50%. The left ventricle has mildly decreased function. The left ventricle demonstrates global hypokinesis. The left ventricular internal cavity size was mildly dilated. Left ventricular diastolic parameters are indeterminate.  2. Right ventricular systolic function is normal. The right ventricular size is normal. There is mildly elevated pulmonary artery systolic pressure. The estimated right ventricular systolic pressure is 37.8 mmHg.  3. Left atrial size was mildly dilated.  4. The mitral valve is normal in structure. Mild mitral valve regurgitation.  5. The aortic valve is calcified. Aortic valve regurgitation is not visualized. Mild to moderate aortic valve stenosis. Vmax 2.8 m/s, MG 21 mmHg, AVA 1.6 cm^2, DI 0.45  6. The inferior vena cava is normal in size with greater than 50% respiratory variability, suggesting right atrial  pressure of 3 mmHg. FINDINGS  Left Ventricle: Left ventricular ejection fraction, by estimation, is 45 to 50%. The left ventricle has mildly decreased function. The left ventricle demonstrates global hypokinesis. Definity  contrast agent was given IV to delineate the left ventricular  endocardial borders. The left ventricular internal cavity size was mildly dilated. There is no left ventricular hypertrophy. Left ventricular diastolic parameters are indeterminate. Right Ventricle: The right ventricular size is normal. No increase in right ventricular wall thickness. Right ventricular systolic function is normal. There is mildly elevated pulmonary artery systolic pressure. The tricuspid regurgitant velocity is 2.95  m/s, and with an assumed right atrial pressure of 3 mmHg, the estimated right ventricular systolic pressure is 37.8 mmHg. Left Atrium: Left atrial size was mildly dilated. Right Atrium: Right atrial size was normal in size. Pericardium: Trivial pericardial effusion is present. Mitral Valve: The mitral valve is normal in structure. Mild mitral valve regurgitation. Tricuspid Valve: The tricuspid valve is normal in structure. Tricuspid valve regurgitation is mild. Aortic Valve: The aortic valve is calcified. Aortic valve regurgitation is not visualized. Mild to moderate aortic stenosis is present. Aortic valve mean gradient measures 18.6 mmHg. Aortic valve peak gradient measures 27.5 mmHg. Aortic valve area, by VTI measures 1.39 cm. Pulmonic Valve: The pulmonic valve was not well visualized. Pulmonic valve regurgitation is not visualized. Aorta: The aortic root and ascending aorta are structurally normal, with no evidence of dilitation. Venous: The inferior vena cava is normal in size with greater than 50% respiratory variability, suggesting right atrial pressure of 3 mmHg. IAS/Shunts: The atrial septum is grossly normal.  LEFT VENTRICLE PLAX 2D LVIDd:         6.00 cm      Diastology LVIDs:         4.60 cm       LV e' medial:    7.87 cm/s LV PW:         0.90 cm      LV E/e' medial:  14.2 LV IVS:        1.00 cm      LV e' lateral:   8.01 cm/s LVOT diam:     2.20 cm      LV E/e' lateral: 13.9 LV SV:         77 LV SV Index:   32 LVOT Area:     3.80 cm  LV Volumes (MOD) LV vol d, MOD A2C: 122.0 ml LV vol d, MOD A4C: 121.0 ml LV vol s, MOD A2C: 43.7 ml LV vol s, MOD A4C: 76.9 ml LV SV MOD A2C:     78.3 ml LV SV MOD A4C:     121.0 ml LV SV MOD BP:  64.1 ml RIGHT VENTRICLE RV Basal diam:  3.50 cm RV Mid diam:    3.60 cm RV S prime:     11.20 cm/s LEFT ATRIUM             Index        RIGHT ATRIUM           Index LA diam:        4.80 cm 2.01 cm/m   RA Area:     19.10 cm LA Vol (A2C):   89.8 ml 37.57 ml/m  RA Volume:   47.80 ml  20.00 ml/m LA Vol (A4C):   87.9 ml 36.77 ml/m LA Biplane Vol: 91.9 ml 38.45 ml/m  AORTIC VALVE AV Area (Vmax):    1.52 cm AV Area (Vmean):   1.39 cm AV Area (VTI):     1.39 cm AV Vmax:           262.40 cm/s AV Vmean:          209.000 cm/s AV VTI:            0.554 m AV Peak Grad:      27.5 mmHg AV Mean Grad:      18.6 mmHg LVOT Vmax:         104.92 cm/s LVOT Vmean:        76.200 cm/s LVOT VTI:          0.202 m LVOT/AV VTI ratio: 0.36  AORTA Ao Asc diam: 3.40 cm MITRAL VALVE                TRICUSPID VALVE MV Area (PHT): 4.70 cm     TR Peak grad:   34.8 mmHg MV Decel Time: 161 msec     TR Vmax:        295.00 cm/s MV E velocity: 111.67 cm/s                             SHUNTS                             Systemic VTI:  0.20 m                             Systemic Diam: 2.20 cm Lonni Nanas MD Electronically signed by Lonni Nanas MD Signature Date/Time: 02/26/2024/2:34:07 PM    Final (Updated)        Nydia Distance M.D. [facility] 02/28/2024, 10:59 AM  Available via Epic secure chat 7am-7pm After 7 pm, please refer to night coverage provider listed on amion.

## 2024-02-28 NOTE — Plan of Care (Signed)

## 2024-02-28 NOTE — Progress Notes (Signed)
 PT placed on CPAP at this time.   02/28/24 2225  BiPAP/CPAP/SIPAP  BiPAP/CPAP/SIPAP Pt Type Adult  BiPAP/CPAP/SIPAP Resmed  Mask Type Full face mask  Dentures removed? Not applicable  Respiratory Rate 18 breaths/min  FiO2 (%) 32 %  Flow Rate 3 lpm  Minute Ventilation 16  Leak 21  Tidal Volume (Vt) 561  Patient Home Machine No  Patient Home Mask No  Patient Home Tubing No  Auto Titrate Yes  Minimum cmH2O 5 cmH2O  Maximum cmH2O 10 cmH2O  Nasal massage performed Yes  CPAP/SIPAP surface wiped down Yes  Device Plugged into RED Power Outlet Yes

## 2024-02-29 ENCOUNTER — Inpatient Hospital Stay (HOSPITAL_COMMUNITY)

## 2024-02-29 DIAGNOSIS — N1832 Chronic kidney disease, stage 3b: Secondary | ICD-10-CM | POA: Diagnosis not present

## 2024-02-29 DIAGNOSIS — I472 Ventricular tachycardia, unspecified: Secondary | ICD-10-CM

## 2024-02-29 DIAGNOSIS — R110 Nausea: Secondary | ICD-10-CM | POA: Diagnosis not present

## 2024-02-29 DIAGNOSIS — I4892 Unspecified atrial flutter: Secondary | ICD-10-CM | POA: Diagnosis not present

## 2024-02-29 DIAGNOSIS — R079 Chest pain, unspecified: Secondary | ICD-10-CM | POA: Diagnosis not present

## 2024-02-29 DIAGNOSIS — I5020 Unspecified systolic (congestive) heart failure: Secondary | ICD-10-CM

## 2024-02-29 DIAGNOSIS — I2510 Atherosclerotic heart disease of native coronary artery without angina pectoris: Secondary | ICD-10-CM | POA: Diagnosis not present

## 2024-02-29 DIAGNOSIS — I4891 Unspecified atrial fibrillation: Secondary | ICD-10-CM | POA: Diagnosis not present

## 2024-02-29 LAB — GLUCOSE, CAPILLARY
Glucose-Capillary: 210 mg/dL — ABNORMAL HIGH (ref 70–99)
Glucose-Capillary: 287 mg/dL — ABNORMAL HIGH (ref 70–99)
Glucose-Capillary: 272 mg/dL — ABNORMAL HIGH (ref 70–99)
Glucose-Capillary: 227 mg/dL — ABNORMAL HIGH (ref 70–99)

## 2024-02-29 LAB — CBC
HCT: 32.1 % — ABNORMAL LOW (ref 39.0–52.0)
Hemoglobin: 9.4 g/dL — ABNORMAL LOW (ref 13.0–17.0)
MCH: 21.9 pg — ABNORMAL LOW (ref 26.0–34.0)
MCHC: 29.3 g/dL — ABNORMAL LOW (ref 30.0–36.0)
MCV: 74.8 fL — ABNORMAL LOW (ref 80.0–100.0)
Platelets: 268 K/uL (ref 150–400)
RBC: 4.29 MIL/uL (ref 4.22–5.81)
RDW: 17.5 % — ABNORMAL HIGH (ref 11.5–15.5)
WBC: 13.7 K/uL — ABNORMAL HIGH (ref 4.0–10.5)
nRBC: 0 % (ref 0.0–0.2)

## 2024-02-29 LAB — RENAL FUNCTION PANEL
Albumin: 2.6 g/dL — ABNORMAL LOW (ref 3.5–5.0)
Anion gap: 11 (ref 5–15)
BUN: 22 mg/dL (ref 8–23)
CO2: 22 mmol/L (ref 22–32)
Calcium: 8.2 mg/dL — ABNORMAL LOW (ref 8.9–10.3)
Chloride: 104 mmol/L (ref 98–111)
Creatinine, Ser: 1.67 mg/dL — ABNORMAL HIGH (ref 0.61–1.24)
GFR, Estimated: 42 mL/min — ABNORMAL LOW (ref >60)
Glucose, Bld: 247 mg/dL — ABNORMAL HIGH (ref 70–99)
Phosphorus: 2.5 mg/dL (ref 2.5–4.6)
Potassium: 3.5 mmol/L (ref 3.5–5.1)
Sodium: 137 mmol/L (ref 135–145)

## 2024-02-29 MED ORDER — METOPROLOL TARTRATE 25 MG PO TABS
25.0000 mg | ORAL_TABLET | Freq: Two times a day (BID) | ORAL | Status: DC
  Administered 2024-02-29 – 2024-03-02 (×5): 25 mg via ORAL
  Filled 2024-02-29 (×6): qty 1

## 2024-02-29 MED ORDER — INSULIN GLARGINE 100 UNIT/ML ~~LOC~~ SOLN
35.0000 [IU] | Freq: Every day | SUBCUTANEOUS | Status: DC
Start: 2024-02-29 — End: 2024-03-01
  Administered 2024-02-29 – 2024-03-01 (×2): 35 [IU] via SUBCUTANEOUS
  Filled 2024-02-29 (×2): qty 0.35

## 2024-02-29 MED ORDER — INSULIN ASPART 100 UNIT/ML IJ SOLN
0.0000 [IU] | Freq: Every day | INTRAMUSCULAR | Status: DC
  Administered 2024-02-29 – 2024-03-02 (×2): 2 [IU] via SUBCUTANEOUS

## 2024-02-29 MED ORDER — GADOBUTROL 1 MMOL/ML IV SOLN
10.0000 mL | Freq: Once | INTRAVENOUS | Status: AC | PRN
  Administered 2024-02-29: 10 mL via INTRAVENOUS

## 2024-02-29 MED ORDER — FUROSEMIDE 10 MG/ML IJ SOLN
40.0000 mg | Freq: Two times a day (BID) | INTRAMUSCULAR | Status: DC
  Administered 2024-02-29 – 2024-03-01 (×3): 40 mg via INTRAVENOUS
  Filled 2024-02-29 (×4): qty 4

## 2024-02-29 MED ORDER — HEPARIN (PORCINE) 25000 UT/250ML-% IV SOLN
1400.0000 [IU]/h | INTRAVENOUS | Status: DC
  Administered 2024-02-29: 1400 [IU]/h via INTRAVENOUS
  Filled 2024-02-29: qty 250

## 2024-02-29 MED ORDER — INSULIN ASPART 100 UNIT/ML IJ SOLN
0.0000 [IU] | Freq: Three times a day (TID) | INTRAMUSCULAR | Status: DC
  Administered 2024-02-29 (×2): 8 [IU] via SUBCUTANEOUS
  Administered 2024-03-01: 3 [IU] via SUBCUTANEOUS
  Administered 2024-03-01 (×2): 5 [IU] via SUBCUTANEOUS
  Administered 2024-03-02: 8 [IU] via SUBCUTANEOUS
  Administered 2024-03-02: 3 [IU] via SUBCUTANEOUS
  Administered 2024-03-02: 8 [IU] via SUBCUTANEOUS
  Administered 2024-03-03: 5 [IU] via SUBCUTANEOUS
  Administered 2024-03-03: 3 [IU] via SUBCUTANEOUS
  Administered 2024-03-03: 8 [IU] via SUBCUTANEOUS
  Administered 2024-03-04 (×2): 2 [IU] via SUBCUTANEOUS
  Administered 2024-03-04: 11 [IU] via SUBCUTANEOUS
  Administered 2024-03-05: 3 [IU] via SUBCUTANEOUS
  Administered 2024-03-05: 2 [IU] via SUBCUTANEOUS
  Administered 2024-03-05 – 2024-03-06 (×2): 8 [IU] via SUBCUTANEOUS
  Administered 2024-03-06: 5 [IU] via SUBCUTANEOUS
  Administered 2024-03-06: 3 [IU] via SUBCUTANEOUS
  Administered 2024-03-07: 2 [IU] via SUBCUTANEOUS
  Administered 2024-03-07: 5 [IU] via SUBCUTANEOUS
  Administered 2024-03-07: 8 [IU] via SUBCUTANEOUS
  Administered 2024-03-08: 5 [IU] via SUBCUTANEOUS
  Administered 2024-03-08: 3 [IU] via SUBCUTANEOUS
  Administered 2024-03-08: 2 [IU] via SUBCUTANEOUS
  Administered 2024-03-09 (×2): 3 [IU] via SUBCUTANEOUS

## 2024-02-29 MED ORDER — LEVALBUTEROL HCL 0.63 MG/3ML IN NEBU: 0.6300 mg | INHALATION_SOLUTION | Freq: Four times a day (QID) | RESPIRATORY_TRACT | Status: DC | PRN

## 2024-02-29 MED ORDER — INSULIN ASPART 100 UNIT/ML IJ SOLN: 0.0000 [IU] | Freq: Three times a day (TID) | INTRAMUSCULAR | Status: DC

## 2024-02-29 NOTE — Progress Notes (Signed)
Placed patient on CPAP for the night with pressure set at 10cm and oxygen set at 3lpm

## 2024-02-29 NOTE — TOC Initial Note (Signed)
 Transition of Care [facility]) - Initial/Assessment Note    Patient Details  Name: Mike Hunter MRN: [id number] Date of Birth: 1950/10/11  Transition of Care [facility]) CM/SW Contact:    Waddell Barnie Rama, RN Phone Number: 02/29/2024, 3:31 PM  Clinical Narrative:                 From home with spouse, has PCP and insurance on file, states has no HH services in place at this time , has home oxygen with [employer] 2 liters and a walker at home.  States family member  (wife) will transport them home at [employer] and family is support system, states gets medications from [facility] on [address] in [place].  Pta self ambulatory.   There are no ICM (Inpatient Case Management) needs identified  at this time.  Please place consult for any ICM (Inpatient Case Management ) needs.    Expected Discharge Plan: Home/Self Care Barriers to Discharge: Continued Medical Work up   Patient Goals and CMS Choice Patient states their goals for this hospitalization and ongoing recovery are:: return home   Choice offered to / list presented to : NA      Expected Discharge Plan and Services In-house Referral: NA Discharge Planning Services: CM Consult Post Acute Care Choice: NA Living arrangements for the past 2 months: Single Family Home                 DME Arranged: N/A DME Agency: NA       HH Arranged: NA HH Agency: NA        Prior Living Arrangements/Services Living arrangements for the past 2 months: Single Family Home Lives with:: Spouse Patient language and need for interpreter reviewed:: Yes Do you feel safe going back to the place where you live?: Yes      Need for Family Participation in Patient Care: Yes (Comment) Care giver support system in place?: Yes (comment) Current home services: DME (home oxygen 2 liters with [employer], walker) Criminal Activity/Legal Involvement Pertinent to Current Situation/Hospitalization: No - Comment as needed  Activities of Daily Living   ADL Screening (condition at time  of admission) Independently performs ADLs?: Yes (appropriate for developmental age) Is the patient deaf or have difficulty hearing?: No Does the patient have difficulty seeing, even when wearing glasses/contacts?: No Does the patient have difficulty concentrating, remembering, or making decisions?: No  Permission Sought/Granted Permission sought to share information with : Case Manager Permission granted to share information with : Yes, Verbal Permission Granted  Share Information with NAME: wife     Permission granted to share info w Relationship: wife     Emotional Assessment Appearance:: Appears stated age Attitude/Demeanor/Rapport: Engaged Affect (typically observed): Appropriate Orientation: : Oriented to Self, Oriented to Place, Oriented to  Time, Oriented to Situation Alcohol / Substance Use: Not Applicable Psych Involvement: No (comment)  Admission diagnosis:  Nausea [R11.0] Atrial flutter with rapid ventricular response (HCC) [I48.92] Chest pain [R07.9] Chest pain, unspecified type [R07.9] Patient Active Problem List   Diagnosis Date Noted   Pressure injury of skin 02/26/2024   Chest pain 02/25/2024   Atrial fibrillation with rapid ventricular response (HCC) 08/24/2023   Moderate persistent asthma with exacerbation 06/07/2023   COPD with acute exacerbation (HCC) 06/07/2023   CAP (community acquired pneumonia) 06/07/2023   Sepsis due to pneumonia (HCC) 06/06/2023   Anemia due to stage 3b chronic kidney disease (HCC) 08/01/2021   Hypokalemia 09/26/2018   Hypomagnesemia 09/26/2018   Acute on chronic diastolic  CHF (congestive heart failure) (HCC) 09/26/2018   CAD (coronary artery disease) 09/26/2018   Asthma 09/26/2018   Depression with anxiety 09/26/2018   Asthma with status asthmaticus    Acute kidney injury superimposed on stage 3b chronic kidney disease (HCC) 09/25/2018   Benign prostatic hyperplasia 01/17/2014   Type II or unspecified type diabetes mellitus with  neurological manifestations, not stated as uncontrolled(250.60) 02/09/2013   Unspecified urinary incontinence 02/09/2013   Esophageal reflux 02/09/2013   Insulin -requiring or dependent type II diabetes mellitus (HCC) 12/30/2012   Pyogenic arthritis of knee (HCC) 12/30/2012   PCP:  Nikki Hansel Atlas, MD Pharmacy:   [facility] [id number] - [place], [place] - [address] [place] [address] Phone: [phone] Fax: [phone]     Social Drivers of Health (SDOH) Social History: SDOH Screenings   Food Insecurity: No Food Insecurity (02/26/2024)  Housing: Low Risk  (02/26/2024)  Transportation Needs: No Transportation Needs (02/26/2024)  Utilities: Not At Risk (02/26/2024)  Social Connections: Socially Integrated (02/26/2024)  Tobacco Use: Medium Risk (02/26/2024)   SDOH Interventions:     Readmission Risk Interventions    02/29/2024    3:28 PM 12/31/2023   10:58 AM  Readmission Risk Prevention Plan  Transportation Screening Complete Complete  Medication Review Oceanographer) Complete Referral to Pharmacy  HRI or Home Care Consult Complete Complete  SW Recovery Care/Counseling Consult  Complete  Palliative Care Screening Not Applicable Not Applicable  Skilled Nursing Facility Not Applicable Not Applicable

## 2024-02-29 NOTE — Progress Notes (Signed)
 PHARMACY - ANTICOAGULATION CONSULT NOTE  Pharmacy Consult for heparin  gtt Indication: atrial fibrillation  Allergies  Allergen Reactions   Buprenorphine Hcl Nausea And Vomiting    Other reaction(s): Nausea And Vomiting    Hydromorphone  Other (See Comments)    Other reaction(s): Mental Status Changes (intolerance) Other reaction(s): HALLUCINATIONS hallucinations hallucinations hallucinations hallucinations    Codeine Nausea And Vomiting   Mirtazapine     Other reaction(s): Other (See Comments) Nightmares & altered mental status    Morphine  And Codeine Nausea And Vomiting    Patient Measurements: Height: 5' 10 (177.8 cm) Weight: 125.2 kg (276 lb 0.3 oz) IBW/kg (Calculated) : 73 HEPARIN  DW (KG): 101.3  Vital Signs: Temp: 97.7 F (36.5 C) (09/15 0757) Temp Source: Oral (09/15 0757) BP: 129/83 (09/15 0800) Pulse Rate: 118 (09/15 0757)  Labs: Recent Labs    02/27/24 0237 02/27/24 1437 02/27/24 1559 02/28/24 0340 02/28/24 1514 02/29/24 0239  HGB 10.1*  --   --  9.2*  --  9.4*  HCT 34.4*  --   --  31.1*  --  32.1*  PLT 342  --   --  282  --  268  CREATININE 1.63* 1.65*  --  1.83*  --  1.67*  TROPONINIHS  --  27* 30*  --  23*  --     Estimated Creatinine Clearance: 50.8 mL/min (A) (by C-G formula based on SCr of 1.67 mg/dL (H)).   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   02/25/2024   atorvastatin  (LIPITOR ) 80 MG tablet Take 1 tablet (80 mg total) by mouth daily. 30 tablet 0 02/25/2024   azithromycin  (ZITHROMAX ) 250 MG tablet Take 250 mg by mouth 3 (three) times a week.   02/25/2024   budesonide  (PULMICORT ) 0.5 MG/2ML nebulizer solution Take 2 mLs by nebulization 2 (two) times daily.   02/25/2024   clopidogrel  (PLAVIX ) 75 MG tablet Take 75 mg by mouth daily.   02/25/2024   donepezil  (ARICEPT ) 10 MG tablet Take 10 mg by mouth at bedtime.   Past Week   insulin  aspart (NOVOLOG  FLEXPEN) 100 UNIT/ML injection Inject 26-44 Units into the skin See admin instructions. 5 units prior to meals for cbg>= 150  Sliding scale (base is 26u in the AM, 30u at noon, and  36-44 at night   02/25/2024   Insulin  Degludec (TRESIBA  FLEXTOUCH [place]) Inject 40-46 Units into the skin See admin instructions. 40 morning, 46 night   02/25/2024   iron polysaccharides (NIFEREX) 150 MG capsule Take 150 mg by mouth 2 (two) times daily.   02/25/2024   isosorbide  mononitrate (IMDUR ) 30 MG 24 hr tablet Take 1 tablet by mouth daily.   02/25/2024   latanoprost  (XALATAN ) 0.005 % ophthalmic solution Place 1 drop into  both eyes at bedtime.     Past Week   memantine  (NAMENDA ) 10 MG tablet Take 10 mg by mouth 2 (two) times daily.   02/25/2024   metoprolol  tartrate (LOPRESSOR ) 25 MG tablet Take 0.5 tablets (12.5 mg total) by mouth 2 (two) times daily. (Patient taking differently: Take 25 mg by mouth 2 (two) times daily.) 30 tablet 0 02/25/2024   mirabegron  ER (MYRBETRIQ ) 50 MG TB24 tablet Take 50 mg by mouth daily.   02/25/2024   montelukast  (SINGULAIR ) 10 MG tablet Take 10 mg by mouth at bedtime.   02/25/2024   Multiple Vitamin (MULTIVITAMIN WITH MINERALS) TABS tablet Take 1 tablet by mouth daily.   02/25/2024   Multiple Vitamins-Minerals (PRESERVISION AREDS PO) Take 2 tablets by mouth daily.   02/25/2024   omeprazole (PRILOSEC) 40 MG capsule Take 1 capsule by mouth at bedtime.    Past Week   oxybutynin  (DITROPAN -XL) 10 MG 24 hr tablet Take 10 mg by mouth daily.   02/25/2024   sertraline  (ZOLOFT ) 100 MG tablet Take 100 mg by mouth daily.   02/25/2024   spironolactone  (ALDACTONE ) 25 MG tablet Take 25 mg by mouth daily.   02/25/2024   Suvorexant (BELSOMRA) 15 MG TABS Take 1 tablet by mouth at bedtime.   02/25/2024   Teriparatide  560 MCG/2.24ML SOPN Inject 2.24 mLs into the skin at bedtime.   Past Week   torsemide  (DEMADEX ) 20 MG tablet Take 1 tablet (20 mg total) by mouth See admin instructions. (Patient taking differently: Take 20 mg by mouth 2 (two) times daily.) 30 tablet 0 02/25/2024   WIXELA INHUB 250-50 MCG/ACT AEPB Inhale 1 puff into the lungs in the morning and at bedtime.   02/25/2024   XARELTO  20 MG TABS tablet Take 20 mg by mouth daily with supper.   02/25/2024 at  4:00 PM   dextromethorphan -guaiFENesin  (MUCINEX  DM) 30-600 MG 12hr tablet Take 1 tablet by mouth 2 (two) times daily. (Patient not taking: Reported on 02/26/2024) 20 tablet 0 Not Taking   Teriparatide  620 MCG/2.48ML SOPN Inject 2.48 mLs into the skin See admin instructions. Give 2.48 ml subcutaneous 6 times a day per patient (Patient not taking: Reported on 02/26/2024)   Not Taking   Scheduled:   arformoterol   15 mcg Nebulization BID   atorvastatin   80 mg Oral Daily   budesonide   2 mL Nebulization BID   clopidogrel   75 mg Oral Daily   donepezil   10 mg Oral QHS   furosemide   40 mg Intravenous BID   insulin  aspart  0-9 Units Subcutaneous TID WC   insulin  aspart  5 Units Subcutaneous TID WC   insulin  glargine  30 Units Subcutaneous Q24H   isosorbide  mononitrate  30 mg Oral Daily   latanoprost   1 drop Both Eyes QHS   memantine   10 mg Oral BID   metoprolol  tartrate  12.5 mg Oral BID   mirabegron  ER  50 mg Oral Daily   montelukast   10 mg Oral QHS   sertraline   100 mg Oral Daily   spironolactone   25 mg Oral Daily    Assessment: 73 YOM w/ a PMH significant for AF on Xarelto  PTA [last dose 9/14 1650]  requiring heparin  gtt for a planned ICD placement Goal of Therapy:  Heparin  level 0.3-0.7 units/ml aPTT 66-102 seconds Monitor platelets by anticoagulation protocol: Yes   Plan:  Start heparin  infusion at 1400 units/hr on 9/15 at 1700 (24 hours post last Xarelto  dose) Check heparin  level and aPTT in 8  hours and daily while on heparin  May DC aPTT once correlates w/ HL Continue to monitor H&H and platelets   Mystic Labo BS, PharmD, BCPS Clinical Pharmacist 02/29/2024 10:21 AM  Contact: [phone] after 3 PM

## 2024-02-29 NOTE — Plan of Care (Signed)
  Problem: Clinical Measurements: Goal: Respiratory complications will improve Outcome: Progressing Goal: Cardiovascular complication will be avoided Outcome: Progressing   Problem: Activity: Goal: Risk for activity intolerance will decrease Outcome: Progressing   Problem: Nutrition: Goal: Adequate nutrition will be maintained Outcome: Progressing   Problem: Coping: Goal: Level of anxiety will decrease Outcome: Progressing   

## 2024-02-29 NOTE — Consult Note (Addendum)
 Cardiology Consultation   Patient ID: [name] MRN: [id number]; DOB: February 04, 1951  Admit date: 02/25/2024 Date of Consult: 02/29/2024  PCP:  [name], [name], MD   [facility] Providers Cardiologist:  Dr. Debora ([facility])   Patient Profile: Mike Hunter is a 73 y.o. male with a hx of  CAD (PCI LAD May 2025) PVD (left ICA Dec 2024) HTN, HLD, COPD (on HS oxygen), OSA w/CPAP, IDDM, neuropathy Pt reports Bipolar d/o CKD (III) HFpEF,   who is being seen 02/29/2024 for the evaluation of WCT at the request of Dr. Loni.  History of Present Illness: Mike Hunter last saw his attending cardiology team out pt May 2025, discussed abnormal dobut. stress echo and c/o CP with plans for cath Known mod AS Also discussed struggling with dizziness > planned for some degree of permissive BPs   11/06/23: LHC/PCI DIAGNOSTIC FINDINGS:    1.  Borderline stenosis of the mid LAD.  RFR abnormal, 0.73.  2.  Otherwise nonobstructive CAD.  3.  Aortic stenosis with mean gradient 23 mmHg by pullback.   PCI:    Successful angioplasty and stenting of the LAD with 2 overlapping stents.   The area of borderline stenosis was focal, but there is atherosclerosis  throughout the LAD which required long segment stenting.  A 2.75 x 48 mm  had a 3.5 x 38 mm Xience drug-eluting stent were used.  This was  aggressively postdilated with noncompliant balloons and IVUS was used for  stent optimization.    July had a brief hospitalization > CP, SOB > AFib w/RVR (also PNA) > spontaneous CV back to SR. Maintained on his OAC and BB >> rec to f/u with his attending cardiologist outpt  Admitted 02/26/24 this admission, reporting a few weeks of more Afib, the day of admission became profoundly weak  after a BM > home check on vitals noted widely variable HRs 40's - 140s Found in rapid AFib/flutter > dilt gtt with better rate control DCCV > SR on 9/12 9/13 > developed WCT 180s associated with hypotension  and severe CP > amio bolus Planned for urgent CV > sedated > O2 > spontaneous CV to AFib slower Recurrent WCT > amio gtt/dig Trops not particularly impressive 27 >30  He is also being treated for COPD/bronchitis (leukocytosis) Diuretics for volume LVEF 45-50%  LABS  HS Trops Trop T 31 > 31  Trop I: 14 > 27 > 30 > 23  K+ stable on 9/13 was 4.0 > 3.8  Today BUN/Creat 22/1.67 WBC 13.6 (11.4 > 12.2 > 17.7 > 18.2 > 13.7) H/H 9.4/32 Plts 268  The patient tells me that he has been struggling for a long time, very sick, with palpitations, SOB, weak spells, no syncope but terrible spells, and CP. Saturday when his heart took off he reports 10/10 CP, unable to breath and thought he was dying.    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   High cholesterol    History of methicillin resistant staphylococcus aureus (MRSA)    Hypertension    Reflux    Renal disorder    Sleep apnea     Past Surgical History:  Procedure Laterality Date  CARDIOVERSION N/A 08/26/2023   Procedure: CARDIOVERSION;  Surgeon: Barbaraann Darryle Ned, MD;  Location: [facility] INVASIVE CV LAB;  Service: Cardiovascular;  Laterality: N/A;   CARDIOVERSION N/A 02/26/2024   Procedure: CARDIOVERSION;  Surgeon: Lonni Slain, MD;  Location: [facility] INVASIVE CV LAB;  Service: Cardiovascular;  Laterality: N/A;   CATARACT EXTRACTION     CERVICAL FUSION     CHOLECYSTECTOMY     KIDNEY STONE SURGERY     KNEE ARTHROSCOPY       Home Medications:  Prior to Admission medications   Medication Sig Start Date End Date Taking? Authorizing Provider  albuterol  (PROVENTIL  HFA;VENTOLIN  HFA) 108 (90 BASE) MCG/ACT inhaler Inhale 2 puffs into the lungs every 4 (four) hours as needed for wheezing.   Yes  [provider]  atorvastatin  (LIPITOR ) 80 MG tablet Take 1 tablet (80 mg total) by mouth daily. 01/06/24 02/26/24 Yes [name], MD  azithromycin  (ZITHROMAX ) 250 MG tablet Take 250 mg by mouth 3 (three) times a week. 02/23/24  Yes [provider]  budesonide  (PULMICORT ) 0.5 MG/2ML nebulizer solution Take 2 mLs by nebulization 2 (two) times daily.   Yes [provider]  clopidogrel  (PLAVIX ) 75 MG tablet Take 75 mg by mouth daily. 05/18/23 05/17/24 Yes [provider]  donepezil  (ARICEPT ) 10 MG tablet Take 10 mg by mouth at bedtime.   Yes [provider]  insulin  aspart (NOVOLOG  FLEXPEN) 100 UNIT/ML injection Inject 26-44 Units into the skin See admin instructions. 5 units prior to meals for cbg>= 150  Sliding scale (base is 26u in the AM, 30u at noon, and  36-44 at night   Yes [provider]  Insulin  Degludec (TRESIBA  FLEXTOUCH [place]) Inject 40-46 Units into the skin See admin instructions. 40 morning, 46 night   Yes [provider]  iron polysaccharides (NIFEREX) 150 MG capsule Take 150 mg by mouth 2 (two) times daily. 02/25/24  Yes [provider]  isosorbide  mononitrate (IMDUR ) 30 MG 24 hr tablet Take 1 tablet by mouth daily. 01/22/22  Yes [provider]  latanoprost  (XALATAN ) 0.005 % ophthalmic solution Place 1 drop into both eyes at bedtime.     Yes [provider]  memantine  (NAMENDA ) 10 MG tablet Take 10 mg by mouth 2 (two) times daily.   Yes [provider]  metoprolol  tartrate (LOPRESSOR ) 25 MG tablet Take 0.5 tablets (12.5 mg total) by mouth 2 (two) times daily. Patient taking differently: Take 25 mg by mouth 2 (two) times daily. 01/05/24  Yes [name], [name], MD  mirabegron  ER (MYRBETRIQ ) 50 MG TB24 tablet Take 50 mg by mouth daily. 02/09/24 02/03/25 Yes [provider]  montelukast  (SINGULAIR ) 10 MG tablet Take 10 mg by mouth at bedtime.   Yes [provider]  Multiple  Vitamin (MULTIVITAMIN WITH MINERALS) TABS tablet Take 1 tablet by mouth daily.   Yes [provider]  Multiple Vitamins-Minerals (PRESERVISION AREDS PO) Take 2 tablets by mouth daily.   Yes [provider]  omeprazole (PRILOSEC) 40 MG capsule Take 1 capsule by mouth at bedtime. 02/02/17  Yes [provider]  oxybutynin  (DITROPAN -XL) 10 MG 24 hr tablet Take 10 mg by mouth daily.   Yes [provider]  sertraline  (ZOLOFT ) 100 MG tablet Take 100 mg by mouth daily.   Yes [provider]  spironolactone  (ALDACTONE ) 25 MG tablet Take 25 mg by mouth daily.   Yes [provider]  Suvorexant (BELSOMRA) 15 MG TABS Take 1 tablet by mouth at  bedtime.   Yes [provider]  Teriparatide  560 MCG/2.24ML SOPN Inject 2.24 mLs into the skin at bedtime. 02/25/24  Yes [provider]  torsemide  (DEMADEX ) 20 MG tablet Take 1 tablet (20 mg total) by mouth See admin instructions. Patient taking differently: Take 20 mg by mouth 2 (two) times daily. 01/05/24 02/26/24 Yes [name], [name], MD  WIXELA INHUB 250-50 MCG/ACT AEPB Inhale 1 puff into the lungs in the morning and at bedtime. 04/20/23  Yes [provider]  XARELTO  20 MG TABS tablet Take 20 mg by mouth daily with supper. 04/04/22  Yes [provider]  dextromethorphan -guaiFENesin  (MUCINEX  DM) 30-600 MG 12hr tablet Take 1 tablet by mouth 2 (two) times daily. Patient not taking: Reported on 02/26/2024 06/10/23   Cheryle Page, MD  Teriparatide  620 MCG/2.48ML SOPN Inject 2.48 mLs into the skin See admin instructions. Give 2.48 ml subcutaneous 6 times a day per patient Patient not taking: Reported on 02/26/2024 11/26/23   [provider]    Scheduled Meds:  arformoterol   15 mcg Nebulization BID   atorvastatin   80 mg Oral Daily   budesonide   2 mL Nebulization BID   clopidogrel   75 mg Oral Daily   donepezil   10 mg Oral QHS   insulin  aspart  0-9 Units Subcutaneous TID WC    insulin  aspart  5 Units Subcutaneous TID WC   insulin  glargine  30 Units Subcutaneous Q24H   isosorbide  mononitrate  30 mg Oral Daily   latanoprost   1 drop Both Eyes QHS   levalbuterol   0.63 mg Nebulization Q6H   memantine   10 mg Oral BID   metoprolol  tartrate  12.5 mg Oral BID   mirabegron  ER  50 mg Oral Daily   montelukast   10 mg Oral QHS   rivaroxaban   20 mg Oral Q supper   sertraline   100 mg Oral Daily   spironolactone   25 mg Oral Daily   Continuous Infusions:  amiodarone  30 mg/hr (02/28/24 2200)   ampicillin -sulbactam (UNASYN ) IV 3 g (02/29/24 0445)   PRN Meds: acetaminophen , ondansetron  **OR** ondansetron  (ZOFRAN ) IV  Allergies:    Allergies  Allergen Reactions   Buprenorphine Hcl Nausea And Vomiting    Other reaction(s): Nausea And Vomiting    Hydromorphone  Other (See Comments)    Other reaction(s): Mental Status Changes (intolerance) Other reaction(s): HALLUCINATIONS hallucinations hallucinations hallucinations hallucinations    Codeine Nausea And Vomiting   Mirtazapine     Other reaction(s): Other (See Comments) Nightmares & altered mental status    Morphine  And Codeine Nausea And Vomiting    Social History:   Social History   Socioeconomic History   Marital status: Married    Spouse name: Not on file   Number of children: Not on file   Years of education: Not on file   Highest education level: Not on file  Occupational History   Not on file  Tobacco Use   Smoking status: Former   Smokeless tobacco: Never  Vaping Use   Vaping status: Never Used  Substance and Sexual Activity   Alcohol use: No   Drug use: No   Sexual activity: Not on file  Other Topics Concern   Not on file  Social History Narrative   Not on file   Social Drivers of Health   Financial Resource Strain: Not on file  Food Insecurity: No Food Insecurity (02/26/2024)   Hunger Vital Sign    Worried About Running Out of Food in the Last Year: Never true  Ran Out of Food in  the Last Year: Never true  Transportation Needs: No Transportation Needs (02/26/2024)   PRAPARE - Administrator, Civil Service (Medical): No    Lack of Transportation (Non-Medical): No  Physical Activity: Not on file  Stress: Not on file  Social Connections: Socially Integrated (02/26/2024)   Social Connection and Isolation Panel    Frequency of Communication with Friends and Family: Three times a week    Frequency of Social Gatherings with Friends and Family: Three times a week    Attends Religious Services: More than 4 times per year    Active Member of Clubs or Organizations: Yes    Attends Banker Meetings: 1 to 4 times per year    Marital Status: Married  Catering manager Violence: Not At Risk (02/26/2024)   Humiliation, Afraid, Rape, and Kick questionnaire    Fear of Current or Ex-Partner: No    Emotionally Abused: No    Physically Abused: No    Sexually Abused: No    Family History:  History reviewed. No pertinent family history.   ROS:  Please see the history of present illness.  All other ROS reviewed and negative.     Physical Exam/Data: Vitals:   02/29/24 0123 02/29/24 0447 02/29/24 0736 02/29/24 0757  BP:  121/76  129/83  Pulse:  65 (!) 120 (!) 118  Resp:  18 20 19   Temp:  98 F (36.7 C)  97.7 F (36.5 C)  TempSrc:  Oral  Oral  SpO2: 98% 97%  98%  Weight:  125.2 kg    Height:        Intake/Output Summary (Last 24 hours) at 02/29/2024 0838 Last data filed at 02/29/2024 0015 Gross per 24 hour  Intake --  Output 3400 ml  Net -3400 ml      02/29/2024    4:47 AM 02/28/2024    4:30 AM 02/27/2024    3:00 AM  Last 3 Weights  Weight (lbs) 276 lb 0.3 oz 274 lb 14.6 oz 273 lb 13 oz  Weight (kg) 125.2 kg 124.7 kg 124.2 kg     Body mass index is 39.6 kg/m.  General:  Well nourished, well developed, somewhat disheveled in appearance with missing teeth, in no acute distress HEENT: normal Neck: no JVD Vascular: No carotid bruits; Distal  pulses 2+ bilaterally Cardiac:  irreg-irreg, 2/6 SM Lungs:  CTA b/l, no wheezing, rhonchi or rales  Abd: soft, nontender, obese  Ext: no edema Musculoskeletal:  No deformities Skin: warm and dry  Neuro:  no focal abnormalities noted Psych:  Normal affect   EKG:  The EKG was personally reviewed and demonstrates:   #2 AFlutter 129bpm, PVC AFlutter 128bpm AFlutter 87bpm SR 69bpm WCT c/w VT 198bpm (LB, LAD, transitions V5 AFib 135bpm  Telemetry:  Telemetry was personally reviewed and demonstrates:   Currently AFlutter/coarse AFib 100's-120's  Relevant CV Studies:  02/26/24: TTE 1. Left ventricular ejection fraction, by estimation, is 45 to 50%. The  left ventricle has mildly decreased function. The left ventricle  demonstrates global hypokinesis. The left ventricular internal cavity size  was mildly dilated. Left ventricular  diastolic parameters are indeterminate.   2. Right ventricular systolic function is normal. The right ventricular  size is normal. There is mildly elevated pulmonary artery systolic  pressure. The estimated right ventricular systolic pressure is 37.8 mmHg.   3. Left atrial size was mildly dilated.   4. The mitral valve is normal in structure. Mild  mitral valve  regurgitation.   5. The aortic valve is calcified. Aortic valve regurgitation is not  visualized. Mild to moderate aortic valve stenosis. Vmax 2.8 m/s, MG 21  mmHg, AVA 1.6 cm^2, DI 0.45   6. The inferior vena cava is normal in size with greater than 50%  respiratory variability, suggesting right atrial pressure of 3 mmHg.    11/06/23: LHC/PCI DIAGNOSTIC FINDINGS:    1.  Borderline stenosis of the mid LAD.  RFR abnormal, 0.73.  2.  Otherwise nonobstructive CAD.  3.  Aortic stenosis with mean gradient 23 mmHg by pullback.   PCI:    Successful angioplasty and stenting of the LAD with 2 overlapping stents.   The area of borderline stenosis was focal, but there is atherosclerosis  throughout the  LAD which required long segment stenting.  A 2.75 x 48 mm  had a 3.5 x 38 mm Xience drug-eluting stent were used.  This was  aggressively postdilated with noncompliant balloons and IVUS was used for  stent optimization.    Dobutamine stress echo 05/27/2023: The patient had 12-21-08 chest pain, central tightness and heaviness.  Baseline basal inferior and  inferolateral hypokinesis became akinetic [possibly hibernating  myocardium], baseline normal  anterior wall became hypokinetic [?], likely severe stenosis of dominant  LCfx, vs RCA and Cfx  stenosis. Test stopped due to severe CP and evidence of ischemia. The  patient achieved 51 % of  maximum predicted heart rate.  AVA 1.2 cm at rest and after dobutamine with adequate increase in SV-  Moderate AS.    TTE 03/03/2023: Left ventricular systolic function is normal.  LV ejection fraction = 55-60%.  There are regional wall motion abnormalities as specified below.  There is basal LV inferolateral wall hypokinesis  There is mid LV inferolateral wall hypokinesis  There is moderate to severe aortic stenosis.  Aortic valve mean pressure gradient is 19 mmHg.  Aortic valve peak pressure gradient is 32 mmHg.  The calculated aortic valve area using the continuity equation is 0.81  cm2.  AS dimensionless index  VTI  is 0.27.  There is mild mitral regurgitation.  There is trace tricuspid regurgitation.  Trace pulmonic valvular regurgitation.  IVC size was normal.  There is no pericardial effusion.  Compared to prior study, aortic valve stenosis has worsened. Wall motion  abnormality is new.    Lexiscan Stress test 03/03/2023: Severe chest pain during Lexiscan injection. 1. No reversible ischemia or infarction.  2. Normal left ventricular wall motion.  3. Left ventricular ejection fraction 51%  4. Non invasive risk stratification*: Low   MRA neck 03/03/2023: 1. 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 Otherwise, normal brain MRI with no acute intracranial pathology. 2. Patent intracranial vasculature without significant stenosis or occlusion. 3. 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 4. Otherwise, patent vasculature of the neck with no evidence of hemodynamically significant stenosis or occlusion   LHC 01/19/2018: LMCA  Normal appearance with 0% stenosis.  LAD    Lesion on Prox LAD  10% stenosis 5 mm length .    Lesion on Mid LAD  20% stenosis 10 mm length .  LCx  Normal appearance with 0% stenosis.  RCA  Normal appearance with 0% stenosis.   Laboratory Data: High Sensitivity Troponin:   Recent Labs  Lab 02/26/24 0702 02/27/24 1437 02/27/24 1559 02/28/24 1514  TROPONINIHS 14 27* 30*  23*     Chemistry Recent Labs  Lab 02/27/24 1437 02/28/24 0340 02/29/24 0239  NA 136 138 137  K 3.8 3.7 3.5  CL 104 102 104  CO2 20* 21* 22  GLUCOSE 327* 214* 247*  BUN 20 22 22   CREATININE 1.65* 1.83* 1.67*  CALCIUM  8.3* 8.4* 8.2*  MG 2.1  --   --   GFRNONAA 43* 38* 42*  ANIONGAP 12 15 11     Recent Labs  Lab 02/25/24 1558 02/26/24 0240 02/27/24 0237 02/27/24 1437 02/28/24 0340 02/29/24 0239  PROT 7.3 6.3*  --  6.7  --   --   ALBUMIN  4.1 3.0*   < > 2.9* 3.0* 2.6*  AST 20 18  --  17  --   --   ALT 22 20  --  19  --   --   ALKPHOS 113 82  --  80  --   --   BILITOT 0.4 0.7  --  1.2  --   --    < > = values in this interval not displayed.   Lipids No results for input(s): CHOL, TRIG, HDL, LABVLDL, LDLCALC, CHOLHDL in the last 168 hours.  Hematology Recent Labs  Lab 02/27/24 0237 02/28/24 0340 02/29/24 0239  WBC 17.7* 18.3* 13.7*  RBC 4.59 4.23 4.29  HGB 10.1* 9.2* 9.4*  HCT 34.4* 31.1* 32.1*  MCV 74.9* 73.5* 74.8*  MCH 22.0* 21.7* 21.9*  MCHC 29.4* 29.6* 29.3*  RDW 17.3* 17.5* 17.5*  PLT 342 282 268   Thyroid  Recent  Labs  Lab 02/26/24 0240  TSH 1.871    BNP Recent Labs  Lab 02/25/24 1520 02/27/24 1103  BNP  --  222.0*  PROBNP 2,137.0*  --     DDimer No results for input(s): DDIMER in the last 168 hours.  Radiology/Studies:  DG CHEST PORT 1 VIEW Result Date: 02/27/2024 EXAM: 1 VIEW XRAY OF THE CHEST 02/27/2024 08:02:00 AM COMPARISON: 01/21/2024 CLINICAL HISTORY: Dyspnea FINDINGS: LUNGS AND PLEURA: Pulmonary vascular congestion without frank edema. No focal pulmonary opacity. No pleural effusion. No pneumothorax. HEART AND MEDIASTINUM: No acute abnormality of the cardiac and mediastinal silhouettes. Aortic atherosclerosis. BONES AND SOFT TISSUES: Cervical spine fusion hardware. No acute osseous abnormality. IMPRESSION: 1. Pulmonary vascular congestion without frank edema. Electronically signed by: Waddell Calk MD 02/27/2024 08:22 AM EDT RP Workstation: [id number]    CT Angio Chest/Abd/Pel for Dissection W and/or Wo Contrast Addendum Date: 02/26/2024 ADDENDUM REPORT: 02/26/2024 15:08 ADDENDUM: There is a single new 3 mm right upper lobe nodule image 16/66. No additional follow-up recommended. Electronically Signed   By: Greig Pique M.D.   On: 02/26/2024 15:08  Result Date: 02/26/2024 CLINICAL DATA:  Evaluate for dissection. EXAM: CT ANGIOGRAPHY CHEST, ABDOMEN AND PELVIS TECHNIQUE: Noncontrast chest CT was performed. Multidetector CT imaging through the chest, abdomen and pelvis was performed using the standard protocol during bolus administration of intravenous contrast. Multiplanar reconstructed images and MIPs were obtained and reviewed to evaluate the vascular anatomy. RADIATION DOSE REDUCTION: This exam was performed according to the departmental dose-optimization program which includes automated exposure control, adjustment of the mA and/or kV according to patient size and/or use of iterative reconstruction technique. CONTRAST:  OMNIPAQUE  IOHEXOL  350 MG/ML SOLN COMPARISON:  CT PE protocol  08/24/2023. CT chest abdomen and pelvis 01/30/2023. CT chest 01/26/2020. FINDINGS: CTA CHEST FINDINGS Cardiovascular: Aorta is normal in size. There is no evidence for aortic dissection or aneurysm. Origin of the great vessels appears patent. Aberrant right  subclavian artery present. There are mild calcified atherosclerotic plaque throughout the aorta. The heart is mildly enlarged. There is no pericardial effusion. There are atherosclerotic calcifications of the coronary arteries. Mediastinum/Nodes: No enlarged mediastinal, hilar, or axillary lymph nodes. Thyroid gland, trachea, and esophagus demonstrate no significant findings. Lungs/Pleura: Right lower lobe nodular density peripherally measures 7 mm image 6/87, unchanged. There is also stable right lower lobe nodule image 6/81 measuring 5 mm. These are unchanged from 2021 and favored as benign. The lungs are otherwise clear. There is no pleural effusion or pneumothorax. Musculoskeletal: Cervical spinal fusion plate is present. There is T10 compression fracture which appears acute or subacute and has progressed compared to the prior study. Review of the MIP images confirms the above findings. CTA ABDOMEN AND PELVIS FINDINGS VASCULAR Aorta: Normal caliber aorta without aneurysm, dissection, vasculitis or significant stenosis. Mild calcified atherosclerotic disease present. Celiac: Patent without evidence of aneurysm, dissection, vasculitis or significant stenosis. SMA: Patent without evidence of aneurysm, dissection, vasculitis or significant stenosis. Renals: Both renal arteries are patent without evidence of aneurysm, dissection, vasculitis, fibromuscular dysplasia or significant stenosis. IMA: Patent without evidence of aneurysm, dissection, vasculitis or significant stenosis. Inflow: Patent without evidence of aneurysm, dissection, vasculitis or significant stenosis. Veins: No obvious venous abnormality within the limitations of this arterial phase study. Review  of the MIP images confirms the above findings. NON-VASCULAR Hepatobiliary: No focal liver abnormality is seen. No gallstones, gallbladder wall thickening, or biliary dilatation. Pancreas: Unremarkable. No pancreatic ductal dilatation or surrounding inflammatory changes. Spleen: Normal in size without focal abnormality. Adrenals/Urinary Tract: Bilateral renal cysts are present. The largest is in the superior pole of the left kidney measuring 3.3 cm. There is no hydronephrosis or perinephric fluid. Adrenal glands and bladder are within normal limits. Stomach/Bowel: Stomach is within normal limits. Appendix appears normal. No evidence of bowel wall thickening, distention, or inflammatory changes. Lymphatic: No enlarged lymph nodes are seen. Reproductive: TURP defect again noted. Other: There is a small fat containing umbilical hernia. There is no ascites. Musculoskeletal: Prominent Schmorl's node of L3 is unchanged. Review of the MIP images confirms the above findings. IMPRESSION: 1. No evidence for aortic dissection or aneurysm. 2. No acute cardiopulmonary process. 3. T10 compression fracture has progressed compared to the prior study. 4. No acute localizing process in the abdomen or pelvis. Aortic Atherosclerosis (ICD10-I70.0). Electronically Signed: By: Greig Pique M.D. On: 02/25/2024 16:42    Assessment and Plan:  WCT c/w VT Likely scar mediated Pending c.MRI I think he will need ICD (single lead)  He reports 2014 w/knee replacement infected w/MRSA IDDM though July 2025 his A1c was pretty good at 6.7  Will hold Xarelto  > heparin  for likely ICD, timing TBD He is on Plavix  (overlapping DES May 2025) will d/w MD Continue amiodarone  gtt I will increase his metoprolol  dose    For questions or updates, please contact [facility] Please consult [email or web address] for contact info under      Signed, Charlies Macario Arthur, PA-C  02/29/2024 8:38 AM

## 2024-02-29 NOTE — Anesthesia Postprocedure Evaluation (Signed)
 Anesthesia Post Note  Patient: Mike Hunter  Procedure(s) Performed: CARDIOVERSION     Patient location during evaluation: PACU Anesthesia Type: General Level of consciousness: sedated and patient cooperative Pain management: pain level controlled Vital Signs Assessment: post-procedure vital signs reviewed and stable Respiratory status: spontaneous breathing Cardiovascular status: stable Anesthetic complications: no   There were no known notable events for this encounter.  Last Vitals:  Vitals:   02/29/24 1112 02/29/24 1604  BP: 113/62 110/73  Pulse: (!) 111 (!) 117  Resp: 20 20  Temp: 37.2 C 36.9 C  SpO2: 98% 100%    Last Pain:  Vitals:   02/29/24 1604  TempSrc: Oral  PainSc:                  [name]

## 2024-02-29 NOTE — Progress Notes (Signed)
 Heart Failure Navigator Progress Note  Assessed for Heart & Vascular TOC clinic readiness.  Patient does not meet criteria due to patient is seen by [facility]. No HF TOC. .   Navigator will sign off at this time.   Randie Bustle, BSN, Scientist, clinical (histocompatibility and immunogenetics) Only

## 2024-02-29 NOTE — Progress Notes (Signed)
 Progress Note  Patient Name: Mike Hunter Date of Encounter: 02/29/2024 Primary Cardiologist: [facility] (Dr. Debora)   Subjective   Overnight had monomorphic VT. Felt like he was going to die.  Vital Signs    Vitals:   02/29/24 0123 02/29/24 0447 02/29/24 0736 02/29/24 0757  BP:  121/76    Pulse:  65 (!) 120   Resp:  18 20 19   Temp:  98 F (36.7 C)  97.7 F (36.5 C)  TempSrc:  Oral    SpO2: 98% 97%    Weight:  125.2 kg    Height:        Intake/Output Summary (Last 24 hours) at 02/29/2024 0800 Last data filed at 02/29/2024 0015 Gross per 24 hour  Intake --  Output 3400 ml  Net -3400 ml   Filed Weights   02/27/24 0300 02/28/24 0430 02/29/24 0447  Weight: 124.2 kg 124.7 kg 125.2 kg    Physical Exam   GEN: No acute distress.   Neck: No JVD Cardiac: iRRR tachycardia, no rubs, or gallops. Systolic murmur Respiratory: Clear to auscultation bilaterally. GI: Soft, nontender, distended with fluid wave MS: No edema  Labs   Telemetry: AF RVR-> MMVT -> AF RVR   Chemistry Recent Labs  Lab 02/25/24 1558 02/26/24 0240 02/27/24 0237 02/27/24 1437 02/28/24 0340 02/29/24 0239  NA  --  140   < > 136 138 137  K  --  3.5   < > 3.8 3.7 3.5  CL  --  107   < > 104 102 104  CO2  --  21*   < > 20* 21* 22  GLUCOSE  --  105*   < > 327* 214* 247*  BUN  --  23   < > 20 22 22   CREATININE  --  1.47*   < > 1.65* 1.83* 1.67*  CALCIUM   --  7.9*   < > 8.3* 8.4* 8.2*  PROT 7.3 6.3*  --  6.7  --   --   ALBUMIN  4.1 3.0*   < > 2.9* 3.0* 2.6*  AST 20 18  --  17  --   --   ALT 22 20  --  19  --   --   ALKPHOS 113 82  --  80  --   --   BILITOT 0.4 0.7  --  1.2  --   --   GFRNONAA  --  49*   < > 43* 38* 42*  ANIONGAP  --  12   < > 12 15 11    < > = values in this interval not displayed.     Hematology Recent Labs  Lab 02/27/24 0237 02/28/24 0340 02/29/24 0239  WBC 17.7* 18.3* 13.7*  RBC 4.59 4.23 4.29  HGB 10.1* 9.2* 9.4*  HCT 34.4* 31.1* 32.1*  MCV 74.9* 73.5* 74.8*   MCH 22.0* 21.7* 21.9*  MCHC 29.4* 29.6* 29.3*  RDW 17.3* 17.5* 17.5*  PLT 342 282 268   BNP Recent Labs  Lab 02/25/24 1520 02/27/24 1103  BNP  --  222.0*  PROBNP 2,137.0*  --      Cardiac Studies   Cardiac Studies & Procedures   ______________________________________________________________________________________________     ECHOCARDIOGRAM  ECHOCARDIOGRAM COMPLETE 02/26/2024  Narrative ECHOCARDIOGRAM REPORT    Patient Name:   Raymont C Coppola Date of Exam: 02/26/2024 Medical Rec #:  [id number]     Height:       70.0 in Accession #:  [id number]    Weight:       275.1 lb Date of Birth:  [birth year]      BSA:          2.390 m Patient Age:    73 years      BP:           111/75 mmHg Patient Gender: M             HR:           87 bpm. Exam Location:  Inpatient  Procedure: 2D Echo and Intracardiac Opacification Agent (Both Spectral and Color Flow Doppler were utilized during procedure).  Indications:     Dyspnea  History:         Patient has prior history of Echocardiogram examinations. Aortic Valve Disease, Arrythmias:Atrial Fibrillation; Signs/Symptoms:Dyspnea.  Sonographer:     Charmaine Gaskins Referring Phys:  [id number] A THOMAS Diagnosing Phys: Lonni Nanas MD  IMPRESSIONS   1. Left ventricular ejection fraction, by estimation, is 45 to 50%. The left ventricle has mildly decreased function. The left ventricle demonstrates global hypokinesis. The left ventricular internal cavity size was mildly dilated. Left ventricular diastolic parameters are indeterminate. 2. Right ventricular systolic function is normal. The right ventricular size is normal. There is mildly elevated pulmonary artery systolic pressure. The estimated right ventricular systolic pressure is 37.8 mmHg. 3. Left atrial size was mildly dilated. 4. The mitral valve is normal in structure. Mild mitral valve regurgitation. 5. The aortic valve is calcified. Aortic valve regurgitation is not  visualized. Mild to moderate aortic valve stenosis. Vmax 2.8 m/s, MG 21 mmHg, AVA 1.6 cm^2, DI 0.45 6. The inferior vena cava is normal in size with greater than 50% respiratory variability, suggesting right atrial pressure of 3 mmHg.  FINDINGS Left Ventricle: Left ventricular ejection fraction, by estimation, is 45 to 50%. The left ventricle has mildly decreased function. The left ventricle demonstrates global hypokinesis. Definity  contrast agent was given IV to delineate the left ventricular endocardial borders. The left ventricular internal cavity size was mildly dilated. There is no left ventricular hypertrophy. Left ventricular diastolic parameters are indeterminate.  Right Ventricle: The right ventricular size is normal. No increase in right ventricular wall thickness. Right ventricular systolic function is normal. There is mildly elevated pulmonary artery systolic pressure. The tricuspid regurgitant velocity is 2.95 m/s, and with an assumed right atrial pressure of 3 mmHg, the estimated right ventricular systolic pressure is 37.8 mmHg.  Left [facility]: Left atrial size was mildly dilated.  Right [facility]: Right atrial size was normal in size.  Pericardium: Trivial pericardial effusion is present.  Mitral Valve: The mitral valve is normal in structure. Mild mitral valve regurgitation.  Tricuspid Valve: The tricuspid valve is normal in structure. Tricuspid valve regurgitation is mild.  Aortic Valve: The aortic valve is calcified. Aortic valve regurgitation is not visualized. Mild to moderate aortic stenosis is present. Aortic valve mean gradient measures 18.6 mmHg. Aortic valve peak gradient measures 27.5 mmHg. Aortic valve area, by VTI measures 1.39 cm.  Pulmonic Valve: The pulmonic valve was not well visualized. Pulmonic valve regurgitation is not visualized.  Aorta: The aortic root and ascending aorta are structurally normal, with no evidence of dilitation.  Venous: The inferior vena  cava is normal in size with greater than 50% respiratory variability, suggesting right atrial pressure of 3 mmHg.  IAS/Shunts: The atrial septum is grossly normal.   LEFT VENTRICLE PLAX 2D LVIDd:         6.00 cm  Diastology LVIDs:         4.60 cm      LV e' medial:    7.87 cm/s LV PW:         0.90 cm      LV E/e' medial:  14.2 LV IVS:        1.00 cm      LV e' lateral:   8.01 cm/s LVOT diam:     2.20 cm      LV E/e' lateral: 13.9 LV SV:         77 LV SV Index:   32 LVOT Area:     3.80 cm  LV Volumes (MOD) LV vol d, MOD A2C: 122.0 ml LV vol d, MOD A4C: 121.0 ml LV vol s, MOD A2C: 43.7 ml LV vol s, MOD A4C: 76.9 ml LV SV MOD A2C:     78.3 ml LV SV MOD A4C:     121.0 ml LV SV MOD BP:      64.1 ml  RIGHT VENTRICLE RV Basal diam:  3.50 cm RV Mid diam:    3.60 cm RV S prime:     11.20 cm/s  LEFT ATRIUM             Index        RIGHT ATRIUM           Index LA diam:        4.80 cm 2.01 cm/m   RA Area:     19.10 cm LA Vol (A2C):   89.8 ml 37.57 ml/m  RA Volume:   47.80 ml  20.00 ml/m LA Vol (A4C):   87.9 ml 36.77 ml/m LA Biplane Vol: 91.9 ml 38.45 ml/m AORTIC VALVE AV Area (Vmax):    1.52 cm AV Area (Vmean):   1.39 cm AV Area (VTI):     1.39 cm AV Vmax:           262.40 cm/s AV Vmean:          209.000 cm/s AV VTI:            0.554 m AV Peak Grad:      27.5 mmHg AV Mean Grad:      18.6 mmHg LVOT Vmax:         104.92 cm/s LVOT Vmean:        76.200 cm/s LVOT VTI:          0.202 m LVOT/AV VTI ratio: 0.36  AORTA Ao Asc diam: 3.40 cm  MITRAL VALVE                TRICUSPID VALVE MV Area (PHT): 4.70 cm     TR Peak grad:   34.8 mmHg MV Decel Time: 161 msec     TR Vmax:        295.00 cm/s MV E velocity: 111.67 cm/s SHUNTS Systemic VTI:  0.20 m Systemic Diam: 2.20 cm  Lonni Nanas MD Electronically signed by Lonni Nanas MD Signature Date/Time: 02/26/2024/2:34:07 PM    Final (Updated)           ______________________________________________________________________________________________        Assessment & Plan    AF RVR - s/p 02/26/24 DCCV - reutrned to AF - on Xarelto  unless EP is planning device, then heparin  - on plavix  and Xarelto , was prior on triple therapy (May to June) - may need dig  HFrEF - Mild AS and MR - NYHA I - limited GDMT due to hypotension; given VT contine  low dose BB with succinate transition  - complicated by CKD Stage IIIB  (very- variable GFR) - GFR is persistently above 30; CMR for today) - volume overloaded: continue IV lasix   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  Reviewed CT imaging- I do not suspect he will need TAVR right now    For questions or updates, please contact [facility] Please consult [email or web address] for contact info under Cardiology/STEMI.      Stanly Leavens, MD [facility] Cardiologist [facility]  [address] [place], [address] [place], [place] [address] [phone]  8:00 AM

## 2024-02-29 NOTE — Progress Notes (Signed)
 [facility]                                                                              Mike Hunter, is a 73 y.o. male, DOB - Jan 03, 1951, FMW:[id number] Admit date - 02/25/2024    Outpatient Primary MD for the patient is [name], [name], MD  LOS - 3  days  Chief Complaint  Patient presents with   Palpitations   Chest Pain       Brief summary   Patient is a 73 year old male with paroxysmal atrial fibrillation on Xarelto , chronic HFpEF, COPD, CKD stage IIIb diabetes mellitus type 2, HTN, osteoporosis, CAD status post PCI, obesity, OSA on CPAP presented to ED with palpitations for 1 week.  He also reported chest pain, pressure with radiation down the left arm, associated shortness of breath, prior to presenting to ED, heart rate was in 130s In ED, patient was found to have heart rate 131, BP 105/53 and was placed on Cardizem  drip cardiology was consulted.  Assessment & Plan    Principal Problem:   Atrial fibrillation with rapid ventricular response (HCC) - Initially placed on Cardizem  drip  - Torsemide , Aldactone , metoprolol , Imdur  were held due to borderline BP, underwent DCCV on 9/12 - 2D echo showed EF of 45 to 50%, global hypokinesis, diastolic parameters indeterminate - Plan for IV heparin  drip hold Xarelto  - Cardiology following, previously on amiodarone , had stopped in the past, now on amiodarone  drip.  Had a DCCV on 9/12 but had a reccurrence of AF postprocedure - Heart rate still not well-controlled, EP cardiology to see today  Wide-complex tachycardia, hemodynamically unstable, chest pain - On 9/13, patient had sudden onset of V. tach, rate 180s to 190s, seen by cardiology urgently and was given IV amiodarone  bolus x2, placed on amiodarone  drip - EP cardiology following, will follow recommendations, planning cardiac MRI, ICD  Acute on chronic systolic and diastolic heart failure with volume overload - Noted to be in volume overload on 9/13, torsemide   and Aldactone  had been held on the admission due to borderline BP - Continue IV Lasix , creatinine improving, 1.6, cardiology following  - Negative balance of 5L   CAD status post PCI,  hypertension -Previous echo 12/2023 had shown EF of 55 to 60%, indeterminate left ventricular diastolic parameters, global hypokinesis, moderate asymmetric LVH of the septal segment.  Mild to moderate aortic stenosis - Continue Plavix , cardiology following   COPD with mild exacerbation Leukocytosis, acute bronchitis -Improving, continue Pulmicort , Brovana , Xopenex  nebs -Troponin 0.71 -Placed on IV Unasyn , blood cultures NTD, leukocytosis improving      Insulin -requiring or dependent type II diabetes mellitus (HCC), uncontrolled with hyperglycemia  -On high doses of insulin  outpatient, last A1c 6.7  CBG (last 3)  Recent Labs    02/28/24 1629 02/28/24 2036 02/29/24 0551  GLUCAP 222* 246* 210*   CBGs uncontrolled, increased SSI to moderate, continue NovoLog  meal coverage 5 units 3 times daily AC -Diabetic coordinator consult    Anemia due to stage 3b chronic kidney disease (HCC) -H&H currently stable.  Baseline hemoglobin 9-10  Mild acute on CKD stage IIIb - Baseline creatinine 1.4-1.7 likely  worsened due to A-fib with RVR, now on diuresis  - Creatinine improving  Hyperlipidemia - Resume statin   Obesity class II Estimated body mass index is 39.6 kg/m as calculated from the following:   Height as of this encounter: 5' 10 (1.778 m).   Weight as of this encounter: 125.2 kg.  Code Status: Full code DVT Prophylaxis:     Level of Care: Level of care: Progressive Family Communication: Cardiology has updated patient's wife today regarding the plan of care Disposition Plan:      Remains inpatient appropriate:      Procedures:  2D echo DCCV  Consultants:   Cardiology  Antimicrobials:   Anti-infectives (From admission, onward)    Start     Dose/Rate Route Frequency Ordered Stop    02/28/24 0945  Ampicillin -Sulbactam (UNASYN ) 3 g in sodium chloride  0.9 % 100 mL IVPB        3 g 200 mL/hr over 30 Minutes Intravenous Every 6 hours 02/28/24 0846 03/04/24 0944          Medications  arformoterol   15 mcg Nebulization BID   atorvastatin   80 mg Oral Daily   budesonide   2 mL Nebulization BID   clopidogrel   75 mg Oral Daily   donepezil   10 mg Oral QHS   furosemide   40 mg Intravenous BID   insulin  aspart  0-9 Units Subcutaneous TID WC   insulin  aspart  5 Units Subcutaneous TID WC   insulin  glargine  30 Units Subcutaneous Q24H   isosorbide  mononitrate  30 mg Oral Daily   latanoprost   1 drop Both Eyes QHS   memantine   10 mg Oral BID   metoprolol  tartrate  12.5 mg Oral BID   mirabegron  ER  50 mg Oral Daily   montelukast   10 mg Oral QHS   sertraline   100 mg Oral Daily   spironolactone   25 mg Oral Daily      Subjective:   Mike Hunter was seen and examined today.  Continues to have tachycardia, overnight had monomorphic VT.  Does not feel too good.  No acute chest pain, having some headache  Objective:   Vitals:   02/29/24 0447 02/29/24 0736 02/29/24 0757 02/29/24 0800  BP: 121/76  129/83 129/83  Pulse: 65 (!) 120 (!) 118   Resp: 18 20 19 20   Temp: 98 F (36.7 C)  97.7 F (36.5 C)   TempSrc: Oral  Oral   SpO2: 97%  98% 100%  Weight: 125.2 kg     Height:        Intake/Output Summary (Last 24 hours) at 02/29/2024 1024 Last data filed at 02/29/2024 0015 Gross per 24 hour  Intake --  Output 2600 ml  Net -2600 ml     Wt Readings from Last 3 Encounters:  02/29/24 125.2 kg  01/05/24 117.9 kg  08/26/23 116 kg    Physical Exam General: Alert and oriented NAD Cardiovascular: Ibular, tachycardia, systolic murmur Respiratory: CTAB, no wheezing Gastrointestinal: Obese, soft, nontender, nondistended, NBS Ext: no pedal edema bilaterally Neuro: no new deficits Psych: Normal affect    Data Reviewed:  I have personally reviewed following labs     CBC Lab Results  Component Value Date   WBC 13.7 (H) 02/29/2024   RBC 4.29 02/29/2024   HGB 9.4 (L) 02/29/2024   HCT 32.1 (L) 02/29/2024   MCV 74.8 (L) 02/29/2024   MCH 21.9 (L) 02/29/2024   PLT 268 02/29/2024   MCHC 29.3 (L) 02/29/2024   RDW 17.5 (  H) 02/29/2024   LYMPHSABS 1.1 08/25/2023   MONOABS 0.8 08/25/2023   EOSABS 0.2 08/25/2023   BASOSABS 0.1 08/25/2023     Last metabolic panel Lab Results  Component Value Date   NA 137 02/29/2024   K 3.5 02/29/2024   CL 104 02/29/2024   CO2 22 02/29/2024   BUN 22 02/29/2024   CREATININE 1.67 (H) 02/29/2024   GLUCOSE 247 (H) 02/29/2024   GFRNONAA 42 (L) 02/29/2024   GFRAA 53 (L) 09/13/2019   CALCIUM  8.2 (L) 02/29/2024   PHOS 2.5 02/29/2024   PROT 6.7 02/27/2024   ALBUMIN  2.6 (L) 02/29/2024   BILITOT 1.2 02/27/2024   ALKPHOS 80 02/27/2024   AST 17 02/27/2024   ALT 19 02/27/2024   ANIONGAP 11 02/29/2024    CBG (last 3)  Recent Labs    02/28/24 1629 02/28/24 2036 02/29/24 0551  GLUCAP 222* 246* 210*      Coagulation Profile: No results for input(s): INR, PROTIME in the last 168 hours.   Radiology Studies: I have personally reviewed the imaging studies  No results found.      Nydia Distance M.D. [facility] 02/29/2024, 10:24 AM  Available via Epic secure chat 7am-7pm After 7 pm, please refer to night coverage provider listed on amion.

## 2024-03-01 ENCOUNTER — Inpatient Hospital Stay (HOSPITAL_COMMUNITY)
Admission: EM | Disposition: A | Discharge status: Home / Self Care | Payer: Self-pay | Source: Home / Self Care | Attending: Internal Medicine

## 2024-03-01 DIAGNOSIS — R079 Chest pain, unspecified: Secondary | ICD-10-CM | POA: Diagnosis not present

## 2024-03-01 DIAGNOSIS — I4891 Unspecified atrial fibrillation: Secondary | ICD-10-CM | POA: Diagnosis not present

## 2024-03-01 DIAGNOSIS — I4892 Unspecified atrial flutter: Secondary | ICD-10-CM | POA: Diagnosis not present

## 2024-03-01 DIAGNOSIS — R110 Nausea: Secondary | ICD-10-CM | POA: Diagnosis not present

## 2024-03-01 DIAGNOSIS — I255 Ischemic cardiomyopathy: Secondary | ICD-10-CM | POA: Diagnosis not present

## 2024-03-01 DIAGNOSIS — I472 Ventricular tachycardia, unspecified: Secondary | ICD-10-CM | POA: Diagnosis not present

## 2024-03-01 HISTORY — PX: ICD IMPLANT: EP1208

## 2024-03-01 LAB — GLUCOSE, CAPILLARY
Glucose-Capillary: 202 mg/dL — ABNORMAL HIGH (ref 70–99)
Glucose-Capillary: 185 mg/dL — ABNORMAL HIGH (ref 70–99)
Glucose-Capillary: 248 mg/dL — ABNORMAL HIGH (ref 70–99)
Glucose-Capillary: 231 mg/dL — ABNORMAL HIGH (ref 70–99)

## 2024-03-01 LAB — CBC
HCT: 33.2 % — ABNORMAL LOW (ref 39.0–52.0)
Hemoglobin: 9.8 g/dL — ABNORMAL LOW (ref 13.0–17.0)
MCH: 22 pg — ABNORMAL LOW (ref 26.0–34.0)
MCHC: 29.5 g/dL — ABNORMAL LOW (ref 30.0–36.0)
MCV: 74.6 fL — ABNORMAL LOW (ref 80.0–100.0)
Platelets: 277 K/uL (ref 150–400)
RBC: 4.45 MIL/uL (ref 4.22–5.81)
RDW: 17.6 % — ABNORMAL HIGH (ref 11.5–15.5)
WBC: 8.7 K/uL (ref 4.0–10.5)
nRBC: 0 % (ref 0.0–0.2)

## 2024-03-01 LAB — RENAL FUNCTION PANEL
Albumin: 2.5 g/dL — ABNORMAL LOW (ref 3.5–5.0)
Anion gap: 10 (ref 5–15)
BUN: 24 mg/dL — ABNORMAL HIGH (ref 8–23)
CO2: 25 mmol/L (ref 22–32)
Calcium: 8.6 mg/dL — ABNORMAL LOW (ref 8.9–10.3)
Chloride: 104 mmol/L (ref 98–111)
Creatinine, Ser: 1.28 mg/dL — ABNORMAL HIGH (ref 0.61–1.24)
GFR, Estimated: 58 mL/min — ABNORMAL LOW (ref >60)
Glucose, Bld: 192 mg/dL — ABNORMAL HIGH (ref 70–99)
Phosphorus: 2.5 mg/dL (ref 2.5–4.6)
Potassium: 3.6 mmol/L (ref 3.5–5.1)
Sodium: 139 mmol/L (ref 135–145)

## 2024-03-01 LAB — APTT
aPTT: 71 s — ABNORMAL HIGH (ref 24–36)
aPTT: 46 s — ABNORMAL HIGH (ref 24–36)

## 2024-03-01 LAB — HEPARIN LEVEL (UNFRACTIONATED): Heparin Unfractionated: 0.99 [IU]/mL — ABNORMAL HIGH (ref 0.30–0.70)

## 2024-03-01 SURGERY — ICD IMPLANT
Anesthesia: LOCAL

## 2024-03-01 MED ORDER — CHLORHEXIDINE GLUCONATE 4 % EX SOLN: 60.0000 mL | Freq: Once | CUTANEOUS | Status: DC

## 2024-03-01 MED ORDER — SODIUM CHLORIDE 0.9 % IV SOLN: 80.0000 mg | INTRAVENOUS | Status: DC

## 2024-03-01 MED ORDER — SODIUM CHLORIDE 0.9 % IV SOLN: INTRAVENOUS | Status: DC

## 2024-03-01 MED ORDER — CHLORHEXIDINE GLUCONATE 4 % EX SOLN
60.0000 mL | Freq: Once | CUTANEOUS | Status: DC
  Filled 2024-03-01: qty 15

## 2024-03-01 MED ORDER — SODIUM CHLORIDE 0.9 % IV SOLN
INTRAVENOUS | Status: AC
  Filled 2024-03-01: qty 2

## 2024-03-01 MED ORDER — POTASSIUM CHLORIDE CRYS ER 20 MEQ PO TBCR
40.0000 meq | EXTENDED_RELEASE_TABLET | Freq: Once | ORAL | Status: AC
  Administered 2024-03-01: 40 meq via ORAL
  Filled 2024-03-01: qty 2

## 2024-03-01 MED ORDER — MIDAZOLAM HCL 5 MG/5ML IJ SOLN
INTRAMUSCULAR | Status: DC | PRN
  Administered 2024-03-01: .5 mg via INTRAVENOUS
  Administered 2024-03-01 (×2): 1 mg via INTRAVENOUS

## 2024-03-01 MED ORDER — AMOXICILLIN-POT CLAVULANATE 875-125 MG PO TABS
1.0000 | ORAL_TABLET | Freq: Two times a day (BID) | ORAL | Status: AC
  Administered 2024-03-02 – 2024-03-04 (×7): 1 via ORAL
  Filled 2024-03-01 (×8): qty 1

## 2024-03-01 MED ORDER — HEPARIN (PORCINE) IN NACL 1000-0.9 UT/500ML-% IV SOLN
INTRAVENOUS | Status: DC | PRN
  Administered 2024-03-01: 500 mL

## 2024-03-01 MED ORDER — LIDOCAINE HCL (PF) 1 % IJ SOLN
INTRAMUSCULAR | Status: DC | PRN
  Administered 2024-03-01: 60 mL

## 2024-03-01 MED ORDER — MIDAZOLAM HCL 2 MG/2ML IJ SOLN
INTRAMUSCULAR | Status: AC
  Filled 2024-03-01: qty 2

## 2024-03-01 MED ORDER — FENTANYL CITRATE (PF) 100 MCG/2ML IJ SOLN
INTRAMUSCULAR | Status: AC
  Filled 2024-03-01: qty 2

## 2024-03-01 MED ORDER — FENTANYL CITRATE (PF) 100 MCG/2ML IJ SOLN
INTRAMUSCULAR | Status: DC | PRN
  Administered 2024-03-01 (×2): 25 ug via INTRAVENOUS
  Administered 2024-03-01: 50 ug via INTRAVENOUS

## 2024-03-01 MED ORDER — LIDOCAINE HCL (PF) 1 % IJ SOLN
INTRAMUSCULAR | Status: AC
  Filled 2024-03-01: qty 60

## 2024-03-01 MED ORDER — SODIUM CHLORIDE 0.9 % IV SOLN
INTRAVENOUS | Status: DC | PRN
  Administered 2024-03-01: 80 mg

## 2024-03-01 MED ORDER — SODIUM CHLORIDE 0.9 % IV SOLN: 250.0000 mL | INTRAVENOUS | Status: DC

## 2024-03-01 MED ORDER — SODIUM CHLORIDE 0.9% FLUSH: 3.0000 mL | INTRAVENOUS | Status: DC | PRN

## 2024-03-01 MED ORDER — CEFAZOLIN SODIUM-DEXTROSE 3-4 GM/150ML-% IV SOLN
3.0000 g | INTRAVENOUS | Status: AC
  Administered 2024-03-01: 3 g via INTRAVENOUS
  Filled 2024-03-01 (×2): qty 150

## 2024-03-01 MED ORDER — LIDOCAINE 5 % EX PTCH
1.0000 | MEDICATED_PATCH | CUTANEOUS | Status: DC
  Administered 2024-03-01 – 2024-03-08 (×8): 1 via TRANSDERMAL
  Filled 2024-03-01 (×8): qty 1

## 2024-03-01 MED ORDER — INSULIN GLARGINE 100 UNIT/ML ~~LOC~~ SOLN
40.0000 [IU] | Freq: Every day | SUBCUTANEOUS | Status: DC
Start: 2024-03-02 — End: 2024-03-06
  Administered 2024-03-02 – 2024-03-06 (×5): 40 [IU] via SUBCUTANEOUS
  Filled 2024-03-01 (×5): qty 0.4

## 2024-03-01 MED ORDER — SODIUM CHLORIDE 0.9% FLUSH
3.0000 mL | Freq: Two times a day (BID) | INTRAVENOUS | Status: DC
  Administered 2024-03-02 – 2024-03-08 (×13): 3 mL via INTRAVENOUS

## 2024-03-01 MED ORDER — INSULIN ASPART 100 UNIT/ML IJ SOLN
8.0000 [IU] | Freq: Three times a day (TID) | INTRAMUSCULAR | Status: DC
  Administered 2024-03-01 – 2024-03-09 (×21): 8 [IU] via SUBCUTANEOUS

## 2024-03-01 SURGICAL SUPPLY — 13 items
CABLE SURGICAL S-101-97-12 (CABLE) ×1 IMPLANT
ICD VIGILANT DR D233 (Pacemaker) IMPLANT
KIT MICROPUNCTURE NIT STIFF (SHEATH) IMPLANT
LEAD INGEVITY 7841 52 (Lead) IMPLANT
LEAD RELIANCE 0673 IMPLANT
MAT PREVALON FULL STRYKER (MISCELLANEOUS) IMPLANT
PAD DEFIB RADIO PHYSIO CONN (PAD) ×1 IMPLANT
SHEATH 7FR PRELUDE SNAP 13 (SHEATH) IMPLANT
SHEATH 8FR PRELUDE SNAP 13 (SHEATH) IMPLANT
SHEATH 8FR PRELUDE SNAP 25 (SHEATH) IMPLANT
SHEATH PROBE COVER 6X72 (BAG) IMPLANT
SYR CONTROL 10ML ANGIOGRAPHIC (SYRINGE) IMPLANT
TRAY PACEMAKER INSERTION (PACKS) ×1 IMPLANT

## 2024-03-01 NOTE — Progress Notes (Signed)
 Progress Note  Patient Name: Mike Hunter Date of Encounter: 03/01/2024 Primary Cardiologist: [facility] (Dr. Debora)   Subjective   Weaned off O2.  CMR noted for effusions.  Pending for device today.  Vital Signs    Vitals:   02/29/24 2251 03/01/24 0023 03/01/24 0429 03/01/24 0802  BP:  100/63 115/69   Pulse: (!) 123 77 100 98  Resp: 19 15 18 18   Temp:  98.7 F (37.1 C) 98.6 F (37 C)   TempSrc:   Axillary   SpO2: 97% 100% 95%   Weight:  124.8 kg    Height:        Intake/Output Summary (Last 24 hours) at 03/01/2024 1101 Last data filed at 03/01/2024 0830 Gross per 24 hour  Intake 951 ml  Output 3700 ml  Net -2749 ml   Filed Weights   02/28/24 0430 02/29/24 0447 03/01/24 0023  Weight: 124.7 kg 125.2 kg 124.8 kg    Physical Exam   GEN: No acute distress.   Neck: No JVD Cardiac: iRRR tachycardia, no rubs, or gallops. Systolic murmur Respiratory: Clear to auscultation bilaterally. GI: Soft, nontender, distended with fluid wave MS: No edema  Labs   Telemetry: AF RVR   Chemistry Recent Labs  Lab 02/25/24 1558 02/26/24 0240 02/27/24 0237 02/27/24 1437 02/28/24 0340 02/29/24 0239 03/01/24 0857  NA  --  140   < > 136 138 137 139  K  --  3.5   < > 3.8 3.7 3.5 3.6  CL  --  107   < > 104 102 104 104  CO2  --  21*   < > 20* 21* 22 25  GLUCOSE  --  105*   < > 327* 214* 247* 192*  BUN  --  23   < > 20 22 22  24*  CREATININE  --  1.47*   < > 1.65* 1.83* 1.67* 1.28*  CALCIUM   --  7.9*   < > 8.3* 8.4* 8.2* 8.6*  PROT 7.3 6.3*  --  6.7  --   --   --   ALBUMIN  4.1 3.0*   < > 2.9* 3.0* 2.6* 2.5*  AST 20 18  --  17  --   --   --   ALT 22 20  --  19  --   --   --   ALKPHOS 113 82  --  80  --   --   --   BILITOT 0.4 0.7  --  1.2  --   --   --   GFRNONAA  --  49*   < > 43* 38* 42* 58*  ANIONGAP  --  12   < > 12 15 11 10    < > = values in this interval not displayed.     Hematology Recent Labs  Lab 02/28/24 0340 02/29/24 0239 03/01/24 0857  WBC 18.3*  13.7* 8.7  RBC 4.23 4.29 4.45  HGB 9.2* 9.4* 9.8*  HCT 31.1* 32.1* 33.2*  MCV 73.5* 74.8* 74.6*  MCH 21.7* 21.9* 22.0*  MCHC 29.6* 29.3* 29.5*  RDW 17.5* 17.5* 17.6*  PLT 282 268 277   BNP Recent Labs  Lab 02/25/24 1520 02/27/24 1103  BNP  --  222.0*  PROBNP 2,137.0*  --      Cardiac Studies   Cardiac Studies & Procedures   ______________________________________________________________________________________________     ECHOCARDIOGRAM  ECHOCARDIOGRAM COMPLETE 02/26/2024  Narrative ECHOCARDIOGRAM REPORT    Patient Name:   Mike C  Hunter Date of Exam: 02/26/2024 Medical Rec #:  [id number]     Height:       70.0 in Accession #:    [id number]    Weight:       275.1 lb Date of Birth:  03/13/1951      BSA:          2.390 m Patient Age:    73 years      BP:           111/75 mmHg Patient Gender: M             HR:           87 bpm. Exam Location:  Inpatient  Procedure: 2D Echo and Intracardiac Opacification Agent (Both Spectral and Color Flow Doppler were utilized during procedure).  Indications:     Dyspnea  History:         Patient has prior history of Echocardiogram examinations. Aortic Valve Disease, Arrythmias:Atrial Fibrillation; Signs/Symptoms:Dyspnea.  Sonographer:     Charmaine Gaskins Referring Phys:  [id number] A THOMAS Diagnosing Phys: Lonni Nanas MD  IMPRESSIONS   1. Left ventricular ejection fraction, by estimation, is 45 to 50%. The left ventricle has mildly decreased function. The left ventricle demonstrates global hypokinesis. The left ventricular internal cavity size was mildly dilated. Left ventricular diastolic parameters are indeterminate. 2. Right ventricular systolic function is normal. The right ventricular size is normal. There is mildly elevated pulmonary artery systolic pressure. The estimated right ventricular systolic pressure is 37.8 mmHg. 3. Left atrial size was mildly dilated. 4. The mitral valve is normal in structure.  Mild mitral valve regurgitation. 5. The aortic valve is calcified. Aortic valve regurgitation is not visualized. Mild to moderate aortic valve stenosis. Vmax 2.8 m/s, MG 21 mmHg, AVA 1.6 cm^2, DI 0.45 6. The inferior vena cava is normal in size with greater than 50% respiratory variability, suggesting right atrial pressure of 3 mmHg.  FINDINGS Left Ventricle: Left ventricular ejection fraction, by estimation, is 45 to 50%. The left ventricle has mildly decreased function. The left ventricle demonstrates global hypokinesis. Definity  contrast agent was given IV to delineate the left ventricular endocardial borders. The left ventricular internal cavity size was mildly dilated. There is no left ventricular hypertrophy. Left ventricular diastolic parameters are indeterminate.  Right Ventricle: The right ventricular size is normal. No increase in right ventricular wall thickness. Right ventricular systolic function is normal. There is mildly elevated pulmonary artery systolic pressure. The tricuspid regurgitant velocity is 2.95 m/s, and with an assumed right atrial pressure of 3 mmHg, the estimated right ventricular systolic pressure is 37.8 mmHg.  Left [facility]: Left atrial size was mildly dilated.  Right [facility]: Right atrial size was normal in size.  Pericardium: Trivial pericardial effusion is present.  Mitral Valve: The mitral valve is normal in structure. Mild mitral valve regurgitation.  Tricuspid Valve: The tricuspid valve is normal in structure. Tricuspid valve regurgitation is mild.  Aortic Valve: The aortic valve is calcified. Aortic valve regurgitation is not visualized. Mild to moderate aortic stenosis is present. Aortic valve mean gradient measures 18.6 mmHg. Aortic valve peak gradient measures 27.5 mmHg. Aortic valve area, by VTI measures 1.39 cm.  Pulmonic Valve: The pulmonic valve was not well visualized. Pulmonic valve regurgitation is not visualized.  Aorta: The aortic root and  ascending aorta are structurally normal, with no evidence of dilitation.  Venous: The inferior vena cava is normal in size with greater than 50% respiratory variability, suggesting right atrial pressure  of 3 mmHg.  IAS/Shunts: The atrial septum is grossly normal.   LEFT VENTRICLE PLAX 2D LVIDd:         6.00 cm      Diastology LVIDs:         4.60 cm      LV e' medial:    7.87 cm/s LV PW:         0.90 cm      LV E/e' medial:  14.2 LV IVS:        1.00 cm      LV e' lateral:   8.01 cm/s LVOT diam:     2.20 cm      LV E/e' lateral: 13.9 LV SV:         77 LV SV Index:   32 LVOT Area:     3.80 cm  LV Volumes (MOD) LV vol d, MOD A2C: 122.0 ml LV vol d, MOD A4C: 121.0 ml LV vol s, MOD A2C: 43.7 ml LV vol s, MOD A4C: 76.9 ml LV SV MOD A2C:     78.3 ml LV SV MOD A4C:     121.0 ml LV SV MOD BP:      64.1 ml  RIGHT VENTRICLE RV Basal diam:  3.50 cm RV Mid diam:    3.60 cm RV S prime:     11.20 cm/s  LEFT ATRIUM             Index        RIGHT ATRIUM           Index LA diam:        4.80 cm 2.01 cm/m   RA Area:     19.10 cm LA Vol (A2C):   89.8 ml 37.57 ml/m  RA Volume:   47.80 ml  20.00 ml/m LA Vol (A4C):   87.9 ml 36.77 ml/m LA Biplane Vol: 91.9 ml 38.45 ml/m AORTIC VALVE AV Area (Vmax):    1.52 cm AV Area (Vmean):   1.39 cm AV Area (VTI):     1.39 cm AV Vmax:           262.40 cm/s AV Vmean:          209.000 cm/s AV VTI:            0.554 m AV Peak Grad:      27.5 mmHg AV Mean Grad:      18.6 mmHg LVOT Vmax:         104.92 cm/s LVOT Vmean:        76.200 cm/s LVOT VTI:          0.202 m LVOT/AV VTI ratio: 0.36  AORTA Ao Asc diam: 3.40 cm  MITRAL VALVE                TRICUSPID VALVE MV Area (PHT): 4.70 cm     TR Peak grad:   34.8 mmHg MV Decel Time: 161 msec     TR Vmax:        295.00 cm/s MV E velocity: 111.67 cm/s SHUNTS Systemic VTI:  0.20 m Systemic Diam: 2.20 cm  Lonni Nanas MD Electronically signed by Lonni Nanas MD Signature Date/Time:  02/26/2024/2:34:07 PM    Final (Updated)        CARDIAC MRI  MR CARDIAC MORPHOLOGY W WO CONTRAST 02/29/2024  Narrative CLINICAL DATA:  Clinical question of scar formation Study assumes HCT of 32 and BSA of 2.49 m2.  EXAM: CARDIAC MRI  TECHNIQUE: The patient was  scanned on a 1.5 Tesla GE magnet. A dedicated cardiac coil was used. Functional imaging was done using Fiesta sequences. 2,3, and 4 chamber views were done to assess for RWMA's. Modified Simpson's rule using was used to calculate an ejection fraction on a dedicated work [employer]. The patient received 10 cc of Gadavist . After 10 minutes inversion recovery sequences were used to assess for infiltration and scar tissue. Flow quantification was performed 2 times during this examination with flow quantification performed at the levels of the ascending aorta above the valve, pulmonary artery above the valve.  CONTRAST:  10 cc  of Gadavist   FINDINGS: 1. Normal left ventricular size, with LVEDD 55 mm, and LVEDVi 59 mL/m2.  Normal left ventricular thickness.  Moderate decrease ventricular systolic function (LVEF =35%). Patient is free breathing and in a irregular rapid rhythm. Grossly suggestive of global hypokinesis. Unable to perform accurate strain.  Left ventricular parametric mapping notable for elevated native T1 in the basal lateral (1372 ms), poor post contrast T1 comparison for ECV, and increased T2 in the basal lateral (59 ms).  There is late gadolinium enhancement in the left ventricular myocardium, that is subendocardial, in the inferior lateral wall. This accounts for 75% of the wall.  Cannot exclude abnormal perfusion in the mid inferolateral.  2. Normal right ventricular size with RVEDVI 53 mL/m2.  Normal right ventricular thickness.  Moderate decrease in the right ventricular systolic function (RVEF =31%). There are no regional wall motion abnormalities or aneurysms but  global hypokinesis.  3.  Normal right atrial size with left atrial enlargement.  4. Normal size of the aortic root, ascending aorta. Moderate dilation of the main pulmonary artery, 31 mm.  5. Valve assessment:  Aortic Valve: Tri-leaflet and calcified valve. Mean gradient 4 mm Hg using anti-aliasing. Regurgitant fraction of 21% may overestimate regurgitation in the setting of RVR acquisition.  Pulmonic Valve: Mild regurgitation, regurgitant fraction of 8% may overestimate regurgitation in the setting of RVR acquisition.  Tricuspid Valve: Sub-optimal view of valve morphology. Qualitatively mild, central regurgitation.  Mitral Valve: Mild central regurgitation. Regurgitant fraction of 13%.  6. Normal pericardium. Small basal lateral pericardial effusion without evidence of tamponade physiology.  7. Grossly, bilateral pleural effusion noted but no extracardiac findings. Recommended dedicated study if concerned for non-cardiac pathology.  8. Difficult acquisition in the setting of free breathing and AF RVR.  IMPRESSION: 1. LVEF 35% in the setting of AF RVR. 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 In correlation with lab testing showing no acute infarction this admission, this is less likely a viable territory.  2.  RVEF 31% in the setting of AF RVR.  3.  Bilateral pleural effusions incidentally noted.  Stanly Leavens MD   Electronically Signed By: Stanly Leavens M.D. On: 02/29/2024 15:57   ______________________________________________________________________________________________        Assessment & Plan    AF RVR - s/p 02/26/24 DCCV - reutrned to AF - AC and anti-plately as per EP - may consider Thursday or Friday DCCV based on clinical course  HFrEF - Mild AS and MR - NYHA I - limited GDMT due to hypotension; given VT contine low dose BB with succinate transition  - complicated by CKD Stage IIIB -  volume overloaded: continue IV lasix  40 BID; may be able to change to low salt ABX therapy  Obstructive CAD MMVT - s/p PCI mid LAD 11/06/23 ([facility]) - continue low dose  - EP is  following - likely scar mediated from basal inferolateral LGE   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      Stanly Leavens, MD FASE [facility] Cardiologist [facility]  [address] [place], [address] [place], [place] [address] [phone]  11:01 AM

## 2024-03-01 NOTE — Progress Notes (Signed)
 PHARMACY - ANTICOAGULATION CONSULT NOTE  Pharmacy Consult for heparin  Indication: atrial fibrillation  Labs: Recent Labs    02/27/24 0237 02/27/24 1437 02/27/24 1559 02/28/24 0340 02/28/24 1514 02/29/24 0239 03/01/24 0125  HGB 10.1*  --   --  9.2*  --  9.4*  --   HCT 34.4*  --   --  31.1*  --  32.1*  --   PLT 342  --   --  282  --  268  --   APTT  --   --   --   --   --   --  71*  HEPARINUNFRC  --   --   --   --   --   --  0.99*  CREATININE 1.63* 1.65*  --  1.83*  --  1.67*  --   TROPONINIHS  --  27* 30*  --  23*  --   --    Assessment/Plan:  73yo male therapeutic on heparin  with initial dosing while DOAC on hold. Will continue infusion at current rate of 1400 units/hr and confirm stable with additional PTT.  Marvetta Dauphin, PharmD, BCPS 03/01/2024 1:52 AM

## 2024-03-01 NOTE — H&P (View-Only) (Signed)
 Rounding Note   Patient Name: Mike Hunter Date of Encounter: 03/01/2024  [facility] Cardiologist:  Dr. Debora ([facility])   Subjective  No new complaints or concerns, laying near flat and supine comfortably this morning  Scheduled Meds:  arformoterol   15 mcg Nebulization BID   atorvastatin   80 mg Oral Daily   budesonide   2 mL Nebulization BID   clopidogrel   75 mg Oral Daily   donepezil   10 mg Oral QHS   furosemide   40 mg Intravenous BID   insulin  aspart  0-15 Units Subcutaneous TID WC   insulin  aspart  0-5 Units Subcutaneous QHS   insulin  aspart  5 Units Subcutaneous TID WC   insulin  glargine  35 Units Subcutaneous Daily   isosorbide  mononitrate  30 mg Oral Daily   latanoprost   1 drop Both Eyes QHS   memantine   10 mg Oral BID   metoprolol  tartrate  25 mg Oral BID   mirabegron  ER  50 mg Oral Daily   montelukast   10 mg Oral QHS   sertraline   100 mg Oral Daily   spironolactone   25 mg Oral Daily   Continuous Infusions:  amiodarone  30 mg/hr (02/29/24 2000)   ampicillin -sulbactam (UNASYN ) IV 3 g (03/01/24 0457)   PRN Meds: acetaminophen , levalbuterol , ondansetron  **OR** ondansetron  (ZOFRAN ) IV   Vital Signs  Vitals:   02/29/24 2251 03/01/24 0023 03/01/24 0429 03/01/24 0802  BP:  100/63 115/69   Pulse: (!) 123 77 100 98  Resp: 19 15 18 18   Temp:  98.7 F (37.1 C) 98.6 F (37 C)   TempSrc:   Axillary   SpO2: 97% 100% 95%   Weight:  124.8 kg    Height:        Intake/Output Summary (Last 24 hours) at 03/01/2024 0820 Last data filed at 03/01/2024 0431 Gross per 24 hour  Intake 1548 ml  Output 4300 ml  Net -2752 ml      03/01/2024   12:23 AM 02/29/2024    4:47 AM 02/28/2024    4:30 AM  Last 3 Weights  Weight (lbs) 275 lb 2.2 oz 276 lb 0.3 oz 274 lb 14.6 oz  Weight (kg) 124.8 kg 125.2 kg 124.7 kg      Telemetry AFib 100's - Personally Reviewed  ECG  No new EKGs - Personally Reviewed  Physical Exam  GEN: No acute distress, poor dentition.    Neck: No JVD Cardiac: RRR, no murmurs, rubs, or gallops.  Respiratory: CTA b/l. GI: Soft, nontender, non-distended  MS: 1+ edema; No deformity. Neuro:  Nonfocal  Psych: Normal affect   Labs High Sensitivity Troponin:   Recent Labs  Lab 02/26/24 0702 02/27/24 1437 02/27/24 1559 02/28/24 1514  TROPONINIHS 14 27* 30* 23*     Chemistry Recent Labs  Lab 02/25/24 1558 02/26/24 0240 02/27/24 0237 02/27/24 1437 02/28/24 0340 02/29/24 0239  NA  --  140   < > 136 138 137  K  --  3.5   < > 3.8 3.7 3.5  CL  --  107   < > 104 102 104  CO2  --  21*   < > 20* 21* 22  GLUCOSE  --  105*   < > 327* 214* 247*  BUN  --  23   < > 20 22 22   CREATININE  --  1.47*   < > 1.65* 1.83* 1.67*  CALCIUM   --  7.9*   < > 8.3* 8.4* 8.2*  MG  --   --   --  2.1  --   --   PROT 7.3 6.3*  --  6.7  --   --   ALBUMIN  4.1 3.0*   < > 2.9* 3.0* 2.6*  AST 20 18  --  17  --   --   ALT 22 20  --  19  --   --   ALKPHOS 113 82  --  80  --   --   BILITOT 0.4 0.7  --  1.2  --   --   GFRNONAA  --  49*   < > 43* 38* 42*  ANIONGAP  --  12   < > 12 15 11    < > = values in this interval not displayed.    Lipids No results for input(s): CHOL, TRIG, HDL, LABVLDL, LDLCALC, CHOLHDL in the last 168 hours.  Hematology Recent Labs  Lab 02/27/24 0237 02/28/24 0340 02/29/24 0239  WBC 17.7* 18.3* 13.7*  RBC 4.59 4.23 4.29  HGB 10.1* 9.2* 9.4*  HCT 34.4* 31.1* 32.1*  MCV 74.9* 73.5* 74.8*  MCH 22.0* 21.7* 21.9*  MCHC 29.4* 29.6* 29.3*  RDW 17.3* 17.5* 17.5*  PLT 342 282 268   Thyroid  Recent Labs  Lab 02/26/24 0240  TSH 1.871    BNP Recent Labs  Lab 02/25/24 1520 02/27/24 1103  BNP  --  222.0*  PROBNP 2,137.0*  --     DDimer No results for input(s): DDIMER in the last 168 hours.   Radiology    Cardiac Studies  C.MRI 02/29/24 IMPRESSION: 1. LVEF 35% in the setting of AF RVR. 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 In correlation with lab  testing showing no acute infarction this admission, this is less likely a viable territory.   2.  RVEF 31% in the setting of AF RVR.   3.  Bilateral pleural effusions incidentally noted.   02/26/24: TTE 1. Left ventricular ejection fraction, by estimation, is 45 to 50%. The  left ventricle has mildly decreased function. The left ventricle  demonstrates global hypokinesis. The left ventricular internal cavity size  was mildly dilated. Left ventricular  diastolic parameters are indeterminate.   2. Right ventricular systolic function is normal. The right ventricular  size is normal. There is mildly elevated pulmonary artery systolic  pressure. The estimated right ventricular systolic pressure is 37.8 mmHg.   3. Left atrial size was mildly dilated.   4. The mitral valve is normal in structure. Mild mitral valve  regurgitation.   5. The aortic valve is calcified. Aortic valve regurgitation is not  visualized. Mild to moderate aortic valve stenosis. Vmax 2.8 m/s, MG 21  mmHg, AVA 1.6 cm^2, DI 0.45   6. The inferior vena cava is normal in size with greater than 50%  respiratory variability, suggesting right atrial pressure of 3 mmHg.      11/06/23: LHC/PCI DIAGNOSTIC FINDINGS:    1.  Borderline stenosis of the mid LAD.  RFR abnormal, 0.73.  2.  Otherwise nonobstructive CAD.  3.  Aortic stenosis with mean gradient 23 mmHg by pullback.   PCI:    Successful angioplasty and stenting of the LAD with 2 overlapping stents.   The area of borderline stenosis was focal, but there is atherosclerosis  throughout the LAD which required long segment stenting.  A 2.75 x 48 mm  had a 3.5 x 38 mm Xience drug-eluting stent were used.  This was  aggressively postdilated with noncompliant balloons and IVUS was used  for  stent optimization.      Dobutamine stress echo 05/27/2023: The patient had 12-21-08 chest pain, central tightness and heaviness.  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 Test stopped due to severe CP and evidence of ischemia. The  patient achieved 51 % of  maximum predicted heart rate.  AVA 1.2 cm at rest and after dobutamine with adequate increase in SV-  Moderate AS.    TTE 03/03/2023: Left ventricular systolic function is normal.  LV ejection fraction = 55-60%.  There are regional wall motion abnormalities as specified below.  There is basal LV inferolateral wall hypokinesis  There is mid LV inferolateral wall hypokinesis  There is moderate to severe aortic stenosis.  Aortic valve mean pressure gradient is 19 mmHg.  Aortic valve peak pressure gradient is 32 mmHg.  The calculated aortic valve area using the continuity equation is 0.81  cm2.  AS dimensionless index  VTI  is 0.27.  There is mild mitral regurgitation.  There is trace tricuspid regurgitation.  Trace pulmonic valvular regurgitation.  IVC size was normal.  There is no pericardial effusion.  Compared to prior study, aortic valve stenosis has worsened. Wall motion  abnormality is new.    Lexiscan Stress test 03/03/2023: Severe chest pain during Lexiscan injection. 1. No reversible ischemia or infarction.  2. Normal left ventricular wall motion.  3. Left ventricular ejection fraction 51%  4. Non invasive risk stratification*: Low  MRA neck 03/03/2023: 1. 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 Otherwise, normal brain MRI with no acute intracranial pathology. 2. Patent intracranial vasculature without significant stenosis or occlusion. 3. 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 4. Otherwise, patent vasculature of the neck with no evidence  of hemodynamically significant stenosis or occlusion   LHC 01/19/2018: LMCA  Normal appearance with 0% stenosis.  LAD    Lesion on Prox LAD  10% stenosis 5 mm length .    Lesion on Mid LAD  20% stenosis 10 mm length .  LCx  Normal appearance with 0% stenosis.  RCA  Normal appearance with 0% stenosis.       Patient Profile   73 y.o. 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) HFpEF   Admitted 02/26/24 this admission, reporting a few weeks of more Afib, the day of admission became profoundly weak  after a BM > home check on vitals noted widely variable HRs 40's - 140s Found in rapid AFib/flutter > dilt gtt with better rate control DCCV > SR on 9/12 9/13 > developed WCT 180s associated with hypotension and severe CP > amio bolus Planned for urgent CV > sedated > O2 > spontaneous CV to AFib slower Recurrent WCT > amio gtt/dig Trops not particularly impressive 27 >30   He is also being treated for COPD/bronchitis (leukocytosis) Diuretics for volume LVEF 45-50% C.MRI LVEF 35% w/scar  Assessment & Plan   WCT c/w VT Amiodarone  gtt continues Likely scar mediated rather then ischemic driven Recommend ICD implant for secondary prevention Patient was seen/examined by Dr. Almetta this morning Discussed indication/recommendation for ICD, implant procedure Patient has no follow up questions for  me on my visit with him  Spoke with his wife Winton on the phone  He reports 2014 w/knee replacement infected w/MRSA IDDM though July 2025 his A1c was pretty good at 6.7    2.  AFib CHA2DS2Vasc is 6 Held his Xarelto  > heparin  for procedure/ICD,  He is on Plavix  (overlapping DES May 2025) will not interrupt Rates are better Heparin  gtt off this Am for implant Will resume OAC post implant pending pocket stability He did have DCCV this admission > settled back in to Afib +/-  TEE/DCCV prior to discharge on amiodarone   3. COPD/asthma, bronchitis On Abx Afebrile, leukocytosis > on the down trend C/w IM  4. Acute/chronic HF Neg yesterday , cumulatively neg C/w attending cards team  For questions or updates, please contact [facility] Please consult [email or web address] for contact info under     Signed, Charlies Macario Arthur, PA-C  03/01/2024, 8:20 AM

## 2024-03-01 NOTE — Progress Notes (Signed)
 Rounding Note   Patient Name: Mike Hunter Date of Encounter: 03/01/2024  [facility] Cardiologist:  Dr. Debora ([facility])   Subjective  No new complaints or concerns, laying near flat and supine comfortably this morning  Scheduled Meds:  arformoterol   15 mcg Nebulization BID   atorvastatin   80 mg Oral Daily   budesonide   2 mL Nebulization BID   clopidogrel   75 mg Oral Daily   donepezil   10 mg Oral QHS   furosemide   40 mg Intravenous BID   insulin  aspart  0-15 Units Subcutaneous TID WC   insulin  aspart  0-5 Units Subcutaneous QHS   insulin  aspart  5 Units Subcutaneous TID WC   insulin  glargine  35 Units Subcutaneous Daily   isosorbide  mononitrate  30 mg Oral Daily   latanoprost   1 drop Both Eyes QHS   memantine   10 mg Oral BID   metoprolol  tartrate  25 mg Oral BID   mirabegron  ER  50 mg Oral Daily   montelukast   10 mg Oral QHS   sertraline   100 mg Oral Daily   spironolactone   25 mg Oral Daily   Continuous Infusions:  amiodarone  30 mg/hr (02/29/24 2000)   ampicillin -sulbactam (UNASYN ) IV 3 g (03/01/24 0457)   PRN Meds: acetaminophen , levalbuterol , ondansetron  **OR** ondansetron  (ZOFRAN ) IV   Vital Signs  Vitals:   02/29/24 2251 03/01/24 0023 03/01/24 0429 03/01/24 0802  BP:  100/63 115/69   Pulse: (!) 123 77 100 98  Resp: 19 15 18 18   Temp:  98.7 F (37.1 C) 98.6 F (37 C)   TempSrc:   Axillary   SpO2: 97% 100% 95%   Weight:  124.8 kg    Height:        Intake/Output Summary (Last 24 hours) at 03/01/2024 0820 Last data filed at 03/01/2024 0431 Gross per 24 hour  Intake 1548 ml  Output 4300 ml  Net -2752 ml      03/01/2024   12:23 AM 02/29/2024    4:47 AM 02/28/2024    4:30 AM  Last 3 Weights  Weight (lbs) 275 lb 2.2 oz 276 lb 0.3 oz 274 lb 14.6 oz  Weight (kg) 124.8 kg 125.2 kg 124.7 kg      Telemetry AFib 100's - Personally Reviewed  ECG  No new EKGs - Personally Reviewed  Physical Exam  GEN: No acute distress, poor dentition.    Neck: No JVD Cardiac: RRR, no murmurs, rubs, or gallops.  Respiratory: CTA b/l. GI: Soft, nontender, non-distended  MS: 1+ edema; No deformity. Neuro:  Nonfocal  Psych: Normal affect   Labs High Sensitivity Troponin:   Recent Labs  Lab 02/26/24 0702 02/27/24 1437 02/27/24 1559 02/28/24 1514  TROPONINIHS 14 27* 30* 23*     Chemistry Recent Labs  Lab 02/25/24 1558 02/26/24 0240 02/27/24 0237 02/27/24 1437 02/28/24 0340 02/29/24 0239  NA  --  140   < > 136 138 137  K  --  3.5   < > 3.8 3.7 3.5  CL  --  107   < > 104 102 104  CO2  --  21*   < > 20* 21* 22  GLUCOSE  --  105*   < > 327* 214* 247*  BUN  --  23   < > 20 22 22   CREATININE  --  1.47*   < > 1.65* 1.83* 1.67*  CALCIUM   --  7.9*   < > 8.3* 8.4* 8.2*  MG  --   --   --  2.1  --   --   PROT 7.3 6.3*  --  6.7  --   --   ALBUMIN  4.1 3.0*   < > 2.9* 3.0* 2.6*  AST 20 18  --  17  --   --   ALT 22 20  --  19  --   --   ALKPHOS 113 82  --  80  --   --   BILITOT 0.4 0.7  --  1.2  --   --   GFRNONAA  --  49*   < > 43* 38* 42*  ANIONGAP  --  12   < > 12 15 11    < > = values in this interval not displayed.    Lipids No results for input(s): CHOL, TRIG, HDL, LABVLDL, LDLCALC, CHOLHDL in the last 168 hours.  Hematology Recent Labs  Lab 02/27/24 0237 02/28/24 0340 02/29/24 0239  WBC 17.7* 18.3* 13.7*  RBC 4.59 4.23 4.29  HGB 10.1* 9.2* 9.4*  HCT 34.4* 31.1* 32.1*  MCV 74.9* 73.5* 74.8*  MCH 22.0* 21.7* 21.9*  MCHC 29.4* 29.6* 29.3*  RDW 17.3* 17.5* 17.5*  PLT 342 282 268   Thyroid  Recent Labs  Lab 02/26/24 0240  TSH 1.871    BNP Recent Labs  Lab 02/25/24 1520 02/27/24 1103  BNP  --  222.0*  PROBNP 2,137.0*  --     DDimer No results for input(s): DDIMER in the last 168 hours.   Radiology    Cardiac Studies  C.MRI 02/29/24 IMPRESSION: 1. LVEF 35% in the setting of AF RVR. 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 In correlation with lab  testing showing no acute infarction this admission, this is less likely a viable territory.   2.  RVEF 31% in the setting of AF RVR.   3.  Bilateral pleural effusions incidentally noted.   02/26/24: TTE 1. Left ventricular ejection fraction, by estimation, is 45 to 50%. The  left ventricle has mildly decreased function. The left ventricle  demonstrates global hypokinesis. The left ventricular internal cavity size  was mildly dilated. Left ventricular  diastolic parameters are indeterminate.   2. Right ventricular systolic function is normal. The right ventricular  size is normal. There is mildly elevated pulmonary artery systolic  pressure. The estimated right ventricular systolic pressure is 37.8 mmHg.   3. Left atrial size was mildly dilated.   4. The mitral valve is normal in structure. Mild mitral valve  regurgitation.   5. The aortic valve is calcified. Aortic valve regurgitation is not  visualized. Mild to moderate aortic valve stenosis. Vmax 2.8 m/s, MG 21  mmHg, AVA 1.6 cm^2, DI 0.45   6. The inferior vena cava is normal in size with greater than 50%  respiratory variability, suggesting right atrial pressure of 3 mmHg.      11/06/23: LHC/PCI DIAGNOSTIC FINDINGS:    1.  Borderline stenosis of the mid LAD.  RFR abnormal, 0.73.  2.  Otherwise nonobstructive CAD.  3.  Aortic stenosis with mean gradient 23 mmHg by pullback.   PCI:    Successful angioplasty and stenting of the LAD with 2 overlapping stents.   The area of borderline stenosis was focal, but there is atherosclerosis  throughout the LAD which required long segment stenting.  A 2.75 x 48 mm  had a 3.5 x 38 mm Xience drug-eluting stent were used.  This was  aggressively postdilated with noncompliant balloons and IVUS was used  for  stent optimization.      Dobutamine stress echo 05/27/2023: The patient had 12-21-08 chest pain, central tightness and heaviness.  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 Test stopped due to severe CP and evidence of ischemia. The  patient achieved 51 % of  maximum predicted heart rate.  AVA 1.2 cm at rest and after dobutamine with adequate increase in SV-  Moderate AS.    TTE 03/03/2023: Left ventricular systolic function is normal.  LV ejection fraction = 55-60%.  There are regional wall motion abnormalities as specified below.  There is basal LV inferolateral wall hypokinesis  There is mid LV inferolateral wall hypokinesis  There is moderate to severe aortic stenosis.  Aortic valve mean pressure gradient is 19 mmHg.  Aortic valve peak pressure gradient is 32 mmHg.  The calculated aortic valve area using the continuity equation is 0.81  cm2.  AS dimensionless index  VTI  is 0.27.  There is mild mitral regurgitation.  There is trace tricuspid regurgitation.  Trace pulmonic valvular regurgitation.  IVC size was normal.  There is no pericardial effusion.  Compared to prior study, aortic valve stenosis has worsened. Wall motion  abnormality is new.    Lexiscan Stress test 03/03/2023: Severe chest pain during Lexiscan injection. 1. No reversible ischemia or infarction.  2. Normal left ventricular wall motion.  3. Left ventricular ejection fraction 51%  4. Non invasive risk stratification*: Low  MRA neck 03/03/2023: 1. 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 Otherwise, normal brain MRI with no acute intracranial pathology. 2. Patent intracranial vasculature without significant stenosis or occlusion. 3. 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 4. Otherwise, patent vasculature of the neck with no evidence  of hemodynamically significant stenosis or occlusion   LHC 01/19/2018: LMCA  Normal appearance with 0% stenosis.  LAD    Lesion on Prox LAD  10% stenosis 5 mm length .    Lesion on Mid LAD  20% stenosis 10 mm length .  LCx  Normal appearance with 0% stenosis.  RCA  Normal appearance with 0% stenosis.       Patient Profile   73 y.o. 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) HFpEF   Admitted 02/26/24 this admission, reporting a few weeks of more Afib, the day of admission became profoundly weak  after a BM > home check on vitals noted widely variable HRs 40's - 140s Found in rapid AFib/flutter > dilt gtt with better rate control DCCV > SR on 9/12 9/13 > developed WCT 180s associated with hypotension and severe CP > amio bolus Planned for urgent CV > sedated > O2 > spontaneous CV to AFib slower Recurrent WCT > amio gtt/dig Trops not particularly impressive 27 >30   He is also being treated for COPD/bronchitis (leukocytosis) Diuretics for volume LVEF 45-50% C.MRI LVEF 35% w/scar  Assessment & Plan   WCT c/w VT Amiodarone  gtt continues Likely scar mediated rather then ischemic driven Recommend ICD implant for secondary prevention Patient was seen/examined by Dr. Almet[place] this morning Discussed indication/recommendation for ICD, implant procedure Patient has no follow up questions for  me on my visit with him  Spoke with his wife Mike Hunter on the phone  He reports 2014 w/knee replacement infected w/MRSA IDDM though July 2025 his A1c was pretty good at 6.7    2.  AFib CHA2DS2Vasc is 6 Held his Xarelto  > heparin  for procedure/ICD,  He is on Plavix  (overlapping DES May 2025) will not interrupt Rates are better Heparin  gtt off this Am for implant Will resume OAC post implant pending pocket stability He did have DCCV this admission > settled back in to Afib +/-  TEE/DCCV prior to discharge on amiodarone   3. COPD/asthma, bronchitis On Abx Afebrile, leukocytosis > on the down trend C/w IM  4. Acute/chronic HF Neg yesterday , cumulatively neg C/w attending cards team  For questions or updates, please contact [facility] Please consult [email or web address] for contact info under     Signed, Charlies Macario Arthur, PA-C  03/01/2024, 8:20 AM

## 2024-03-01 NOTE — Inpatient Diabetes Management (Addendum)
 Inpatient Diabetes Program Recommendations  AACE/ADA: New Consensus Statement on Inpatient Glycemic Control (2015)  Target Ranges:  Prepandial:   less than 140 mg/dL      Peak postprandial:   less than 180 mg/dL (1-2 hours)      Critically ill patients:  140 - 180 mg/dL   Lab Results  Component Value Date   GLUCAP 185 (H) 03/01/2024   HGBA1C 6.7 (H) 01/04/2024    Review of Glycemic Control  Diabetes history: DM2 Outpatient Diabetes medications: Tresiba  40 units in am, 46 units in pm, Novolog  sliding scale TID, Farxiga  Current orders for Inpatient glycemic control: Lantus  40 units daily (starting 9/17), Novolog  0-15 units correction scale TID, Novolog  0-5 units HS scale, Novolog  8 units TID with meals  Inpatient Diabetes Program Recommendations:  Received diabetes coordinator consult.   Spoke with patient, wife, and daughter at the bedside. Patient was diagnosed with diabetes about 25-30 years ago. His family has a history of DM. Patient has been taking Tresiba  and Novolog  for some time. The current dose of Tresiba  has been the same for about 3 months. Patient checks blood sugars with meter 4-5 times per day. He is very knowledgeable on how to take care of his diabetes and the details of his medication regimen. Both his wife and daughter have diabetes.  Noted that patient's last HgbA1C in July, 2025 was 6.7%.  Patient states that it has been as high as 13% in the past. He seems very pleased with how much better his blood sugars have been recently.   Will continue to monitor blood sugars while in the hospital.  Marjorie Lunger RN BSN CDE Diabetes Coordinator Pager: [phone]  8am-5pm

## 2024-03-01 NOTE — Interval H&P Note (Signed)
 History and Physical Interval Note:  03/01/2024 10:49 AM  Mike Hunter  has presented today for surgery, with the diagnosis of ventricular tachycardia and ischemic cardiomyopathy.  The various methods of treatment have been discussed with the patient and family. After consideration of risks, benefits and other options for treatment, the patient has consented to  Procedure(s): ICD IMPLANT (N/A) as a surgical intervention.  The patient's history has been reviewed, patient examined, no change in status, stable for surgery.  I have reviewed the patient's chart and labs.  Questions were answered to the patient's satisfaction.     Fonda Kitty

## 2024-03-01 NOTE — Progress Notes (Signed)
 [facility]                                                                              Mike Hunter, is a 73 y.o. male, DOB - 1950-10-09, FMW:[id number] Admit date - 02/25/2024    Outpatient Primary MD for the patient is [name], [name], MD  LOS - 4  days  Chief Complaint  Patient presents with   Palpitations   Chest Pain       Brief summary   Patient is a 73 year old male with paroxysmal atrial fibrillation on Xarelto , chronic HFpEF, COPD, CKD stage IIIb diabetes mellitus type 2, HTN, osteoporosis, CAD status post PCI, obesity, OSA on CPAP presented to ED with palpitations for 1 week.  He also reported chest pain, pressure with radiation down the left arm, associated shortness of breath, prior to presenting to ED, heart rate was in 130s In ED, patient was found to have heart rate 131, BP 105/53 and was placed on Cardizem  drip cardiology was consulted.  Assessment & Plan    Principal Problem:   Atrial fibrillation with rapid ventricular response (HCC) - Initially placed on Cardizem  drip  - Torsemide , Aldactone , metoprolol , Imdur  were held due to borderline BP - 2D echo showed EF of 45 to 50%, global hypokinesis, diastolic parameters indeterminate -  DCCV on 9/12 but had a reccurrence of AF postprocedure on 9/13, started on amiodarone  drip by cardiology - EP cardiology consulted, continue amiodarone , follow recommendations  Wide-complex tachycardia, hemodynamically unstable, chest pain - On 9/13, patient had sudden onset of V. tach, rate 180s to 190s, seen by cardiology urgently and was given IV amiodarone  bolus x2, placed on amiodarone  drip - EP cardiology following, plan for ICD  Acute on chronic systolic and diastolic heart failure with volume overload - Noted to be in volume overload on 9/13, torsemide  and Aldactone  had been held on the admission due to borderline BP - Continue IV Lasix , creatinine improving, 1.2 - Negative balance of 8.3 L   CAD  status post PCI,  hypertension -Previous echo 12/2023 had shown EF of 55 to 60%, indeterminate left ventricular diastolic parameters, global hypokinesis, moderate asymmetric LVH of the septal segment.  Mild to moderate aortic stenosis - Continue Plavix , cardiology following   COPD with mild exacerbation Leukocytosis, acute bronchitis -Improving, continue Pulmicort , Brovana , Xopenex  nebs -Troponin 0.71 -Placed on IV Unasyn , blood cultures NTD, leukocytosis resolved      Insulin -requiring or dependent type II diabetes mellitus (HCC), uncontrolled with hyperglycemia  -On high doses of insulin  outpatient, last A1c 6.7  CBG (last 3)  Recent Labs    02/29/24 1602 02/29/24 2132 03/01/24 0606  GLUCAP 272* 227* 202*   -CBG still uncontrolled, increased Lantus  to 40 units daily, NovoLog  8 units 3 times daily AC, moderate SSI -Diabetic coordinator consult    Anemia due to stage 3b chronic kidney disease (HCC) -H&H currently stable.  Baseline hemoglobin 9-10  Mild acute on CKD stage IIIb - Baseline creatinine 1.4-1.7 likely worsened due to A-fib with RVR, now on diuresis  - Creatinine improving  Hyperlipidemia - Resume statin   Obesity class II Estimated body mass index  is 39.48 kg/m as calculated from the following:   Height as of this encounter: 5' 10 (1.778 m).   Weight as of this encounter: 124.8 kg.  Code Status: Full code DVT Prophylaxis:  SCDs Start: 03/01/24 1302   Level of Care: Level of care: Progressive Family Communication:  Disposition Plan:      Remains inpatient appropriate:   Management per cardiology   Procedures:  2D echo DCCV  Consultants:   Cardiology  Antimicrobials:   Anti-infectives (From admission, onward)    Start     Dose/Rate Route Frequency Ordered Stop   03/01/24 1700  gentamicin  (GARAMYCIN ) 80 mg in sodium chloride  0.9 % 500 mL irrigation  Status:  Discontinued        80 mg Irrigation On call 03/01/24 0928 03/01/24 1237   03/01/24  1159  gentamicin  (GARAMYCIN ) 80 mg in sodium chloride  0.9 % 500 mL irrigation  Status:  Discontinued          As needed 03/01/24 1159 03/01/24 1237   03/01/24 1015  ceFAZolin  (ANCEF ) IVPB 3g/150 mL premix        3 g 300 mL/hr over 30 Minutes Intravenous On call 03/01/24 0928 03/01/24 1135   03/01/24 0928  sodium chloride  0.9 % with gentamicin  (GARAMYCIN ) ADS Med       Note to Pharmacy: Sullivan Niece J: cabinet override      03/01/24 0928 03/01/24 2144   02/28/24 0945  Ampicillin -Sulbactam (UNASYN ) 3 g in sodium chloride  0.9 % 100 mL IVPB        3 g 200 mL/hr over 30 Minutes Intravenous Every 6 hours 02/28/24 0846 03/04/24 0944          Medications  arformoterol   15 mcg Nebulization BID   atorvastatin   80 mg Oral Daily   budesonide   2 mL Nebulization BID   clopidogrel   75 mg Oral Daily   donepezil   10 mg Oral QHS   furosemide   40 mg Intravenous BID   insulin  aspart  0-15 Units Subcutaneous TID WC   insulin  aspart  0-5 Units Subcutaneous QHS   insulin  aspart  5 Units Subcutaneous TID WC   insulin  glargine  35 Units Subcutaneous Daily   isosorbide  mononitrate  30 mg Oral Daily   latanoprost   1 drop Both Eyes QHS   memantine   10 mg Oral BID   metoprolol  tartrate  25 mg Oral BID   mirabegron  ER  50 mg Oral Daily   montelukast   10 mg Oral QHS   sertraline   100 mg Oral Daily   sodium chloride  0.9 % with gentamicin  (GARAMYCIN ) ADS Med       sodium chloride  flush  3 mL Intravenous Q12H   spironolactone   25 mg Oral Daily      Subjective:   Mike Hunter was seen and examined today.  Per patient, having ICD placed today.  Currently no acute chest pain, no headache nausea vomiting fevers.   Objective:   Vitals:   03/01/24 1207 03/01/24 1212 03/01/24 1217 03/01/24 1222  BP: 108/73 (!) 111/91 113/77 101/67  Pulse: (!) 121 (!) 107 (!) 112 (!) 119  Resp: 20 20 19 17   Temp:      TempSrc:      SpO2: 100% 100% 94% 98%  Weight:      Height:        Intake/Output Summary (Last  24 hours) at 03/01/2024 1320 Last data filed at 03/01/2024 0830 Gross per 24 hour  Intake 714 ml  Output  3700 ml  Net -2986 ml     Wt Readings from Last 3 Encounters:  03/01/24 124.8 kg  01/05/24 117.9 kg  08/26/23 116 kg   Physical Exam General: Alert and oriented x 3, NAD Cardiovascular: Irregular, tachycardia, systolic murmur Respiratory: CTAB, no wheezing Gastrointestinal: Soft, nontender, nondistended, NBS Ext: no pedal edema bilaterally Neuro: no new deficits Psych: Normal affect   Data Reviewed:  I have personally reviewed following labs    CBC Lab Results  Component Value Date   WBC 8.7 03/01/2024   RBC 4.45 03/01/2024   HGB 9.8 (L) 03/01/2024   HCT 33.2 (L) 03/01/2024   MCV 74.6 (L) 03/01/2024   MCH 22.0 (L) 03/01/2024   PLT 277 03/01/2024   MCHC 29.5 (L) 03/01/2024   RDW 17.6 (H) 03/01/2024   LYMPHSABS 1.1 08/25/2023   MONOABS 0.8 08/25/2023   EOSABS 0.2 08/25/2023   BASOSABS 0.1 08/25/2023     Last metabolic panel Lab Results  Component Value Date   NA 139 03/01/2024   K 3.6 03/01/2024   CL 104 03/01/2024   CO2 25 03/01/2024   BUN 24 (H) 03/01/2024   CREATININE 1.28 (H) 03/01/2024   GLUCOSE 192 (H) 03/01/2024   GFRNONAA 58 (L) 03/01/2024   GFRAA 53 (L) 09/13/2019   CALCIUM  8.6 (L) 03/01/2024   PHOS 2.5 03/01/2024   PROT 6.7 02/27/2024   ALBUMIN  2.5 (L) 03/01/2024   BILITOT 1.2 02/27/2024   ALKPHOS 80 02/27/2024   AST 17 02/27/2024   ALT 19 02/27/2024   ANIONGAP 10 03/01/2024    CBG (last 3)  Recent Labs    02/29/24 1602 02/29/24 2132 03/01/24 0606  GLUCAP 272* 227* 202*      Coagulation Profile: No results for input(s): INR, PROTIME in the last 168 hours.   Radiology Studies: I have personally reviewed the imaging studies  EP PPM/ICD IMPLANT Result Date: 03/01/2024  CONCLUSIONS:  1. Successful dual chamber ICD implantation for secondary prevention of sudden death.  2.  No early apparent complications. Fonda Kitty, MD,  [facility], [facility] Cardiac Electrophysiology   MR CARDIAC MORPHOLOGY W WO CONTRAST Result Date: 02/29/2024 CLINICAL DATA:  Clinical question of scar formation Study assumes HCT of 32 and BSA of 2.49 m2. EXAM: CARDIAC MRI TECHNIQUE: The patient was scanned on a 1.5 Tesla GE magnet. A dedicated cardiac coil was used. Functional imaging was done using Fiesta sequences. 2,3, and 4 chamber views were done to assess for RWMA's. Modified Simpson's rule using was used to calculate an ejection fraction on a dedicated work [employer], [employer]. The patient received 10 cc of Gadavist . After 10 minutes inversion recovery sequences were used to assess for infiltration and scar tissue. Flow quantification was performed 2 times during this examination with flow quantification performed at the levels of the ascending aorta above the valve, pulmonary artery above the valve. CONTRAST:  10 cc  of Gadavist  FINDINGS: 1. Normal left ventricular size, with LVEDD 55 mm, and LVEDVi 59 mL/m2. Normal left ventricular thickness. Moderate decrease ventricular systolic function (LVEF =35%). Patient is free breathing and in a irregular rapid rhythm. Grossly suggestive of global hypokinesis. Unable to perform accurate strain. Left ventricular parametric mapping notable for elevated native T1 in the basal lateral (1372 ms), poor post contrast T1 comparison for ECV, and increased T2 in the basal lateral (59 ms). There is late gadolinium enhancement in the left ventricular myocardium, that is subendocardial, in the inferior lateral wall. This accounts for 75% of the wall.  Cannot exclude abnormal perfusion in the mid inferolateral. 2. Normal right ventricular size with RVEDVI 53 mL/m2. Normal right ventricular thickness. Moderate decrease in the right ventricular systolic function (RVEF =31%). There are no regional wall motion abnormalities or aneurysms but global hypokinesis. 3.  Normal right atrial size with left atrial enlargement. 4. Normal  size of the aortic root, ascending aorta. Moderate dilation of the main pulmonary artery, 31 mm. 5. Valve assessment: Aortic Valve: Tri-leaflet and calcified valve. Mean gradient 4 mm Hg using anti-aliasing. Regurgitant fraction of 21% may overestimate regurgitation in the setting of RVR acquisition. Pulmonic Valve: Mild regurgitation, regurgitant fraction of 8% may overestimate regurgitation in the setting of RVR acquisition. Tricuspid Valve: Sub-optimal view of valve morphology. Qualitatively mild, central regurgitation. Mitral Valve: Mild central regurgitation. Regurgitant fraction of 13%. 6. Normal pericardium. Small basal lateral pericardial effusion without evidence of tamponade physiology. 7. Grossly, bilateral pleural effusion noted but no extracardiac findings. Recommended dedicated study if concerned for non-cardiac pathology. 8. Difficult acquisition in the setting of free breathing and AF RVR. IMPRESSION: 1. LVEF 35% in the setting of AF RVR. 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 In correlation with lab testing showing no acute infarction this admission, this is less likely a viable territory. 2.  RVEF 31% in the setting of AF RVR. 3.  Bilateral pleural effusions incidentally noted. Stanly Leavens MD Electronically Signed   By: Stanly Leavens M.D.   On: 02/29/2024 15:57   MR CARDIAC VELOCITY FLOW MAP Result Date: 02/29/2024 CLINICAL DATA:  Clinical question of scar formation Study assumes HCT of 32 and BSA of 2.49 m2. EXAM: CARDIAC MRI TECHNIQUE: The patient was scanned on a 1.5 Tesla GE magnet. A dedicated cardiac coil was used. Functional imaging was done using Fiesta sequences. 2,3, and 4 chamber views were done to assess for RWMA's. Modified Simpson's rule using was used to calculate an ejection fraction on a dedicated work [employer] officer, [employer]. The patient received 10 cc of Gadavist . After 10 minutes inversion recovery sequences were  used to assess for infiltration and scar tissue. Flow quantification was performed 2 times during this examination with flow quantification performed at the levels of the ascending aorta above the valve, pulmonary artery above the valve. CONTRAST:  10 cc  of Gadavist  FINDINGS: 1. Normal left ventricular size, with LVEDD 55 mm, and LVEDVi 59 mL/m2. Normal left ventricular thickness. Moderate decrease ventricular systolic function (LVEF =35%). Patient is free breathing and in a irregular rapid rhythm. Grossly suggestive of global hypokinesis. Unable to perform accurate strain. Left ventricular parametric mapping notable for elevated native T1 in the basal lateral (1372 ms), poor post contrast T1 comparison for ECV, and increased T2 in the basal lateral (59 ms). There is late gadolinium enhancement in the left ventricular myocardium, that is subendocardial, in the inferior lateral wall. This accounts for 75% of the wall. Cannot exclude abnormal perfusion in the mid inferolateral. 2. Normal right ventricular size with RVEDVI 53 mL/m2. Normal right ventricular thickness. Moderate decrease in the right ventricular systolic function (RVEF =31%). There are no regional wall motion abnormalities or aneurysms but global hypokinesis. 3.  Normal right atrial size with left atrial enlargement. 4. Normal size of the aortic root, ascending aorta. Moderate dilation of the main pulmonary artery, 31 mm. 5. Valve assessment: Aortic Valve: Tri-leaflet and calcified valve. Mean gradient 4 mm Hg using anti-aliasing. Regurgitant fraction of 21% may overestimate regurgitation in the setting of RVR acquisition.  Pulmonic Valve: Mild regurgitation, regurgitant fraction of 8% may overestimate regurgitation in the setting of RVR acquisition. Tricuspid Valve: Sub-optimal view of valve morphology. Qualitatively mild, central regurgitation. Mitral Valve: Mild central regurgitation. Regurgitant fraction of 13%. 6. Normal pericardium. Small basal  lateral pericardial effusion without evidence of tamponade physiology. 7. Grossly, bilateral pleural effusion noted but no extracardiac findings. Recommended dedicated study if concerned for non-cardiac pathology. 8. Difficult acquisition in the setting of free breathing and AF RVR. IMPRESSION: 1. LVEF 35% in the setting of AF RVR. 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 In correlation with lab testing showing no acute infarction this admission, this is less likely a viable territory. 2.  RVEF 31% in the setting of AF RVR. 3.  Bilateral pleural effusions incidentally noted. Stanly Leavens MD Electronically Signed   By: Stanly Leavens M.D.   On: 02/29/2024 15:57   MR CARDIAC VELOCITY FLOW MAP Result Date: 02/29/2024 CLINICAL DATA:  Clinical question of scar formation Study assumes HCT of 32 and BSA of 2.49 m2. EXAM: CARDIAC MRI TECHNIQUE: The patient was scanned on a 1.5 Tesla GE magnet. A dedicated cardiac coil was used. Functional imaging was done using Fiesta sequences. 2,3, and 4 chamber views were done to assess for RWMA's. Modified Simpson's rule using was used to calculate an ejection fraction on a dedicated work [employer] officer, [employer]. The patient received 10 cc of Gadavist . After 10 minutes inversion recovery sequences were used to assess for infiltration and scar tissue. Flow quantification was performed 2 times during this examination with flow quantification performed at the levels of the ascending aorta above the valve, pulmonary artery above the valve. CONTRAST:  10 cc  of Gadavist  FINDINGS: 1. Normal left ventricular size, with LVEDD 55 mm, and LVEDVi 59 mL/m2. Normal left ventricular thickness. Moderate decrease ventricular systolic function (LVEF =35%). Patient is free breathing and in a irregular rapid rhythm. Grossly suggestive of global hypokinesis. Unable to perform accurate strain. Left ventricular parametric mapping notable for  elevated native T1 in the basal lateral (1372 ms), poor post contrast T1 comparison for ECV, and increased T2 in the basal lateral (59 ms). There is late gadolinium enhancement in the left ventricular myocardium, that is subendocardial, in the inferior lateral wall. This accounts for 75% of the wall. Cannot exclude abnormal perfusion in the mid inferolateral. 2. Normal right ventricular size with RVEDVI 53 mL/m2. Normal right ventricular thickness. Moderate decrease in the right ventricular systolic function (RVEF =31%). There are no regional wall motion abnormalities or aneurysms but global hypokinesis. 3.  Normal right atrial size with left atrial enlargement. 4. Normal size of the aortic root, ascending aorta. Moderate dilation of the main pulmonary artery, 31 mm. 5. Valve assessment: Aortic Valve: Tri-leaflet and calcified valve. Mean gradient 4 mm Hg using anti-aliasing. Regurgitant fraction of 21% may overestimate regurgitation in the setting of RVR acquisition. Pulmonic Valve: Mild regurgitation, regurgitant fraction of 8% may overestimate regurgitation in the setting of RVR acquisition. Tricuspid Valve: Sub-optimal view of valve morphology. Qualitatively mild, central regurgitation. Mitral Valve: Mild central regurgitation. Regurgitant fraction of 13%. 6. Normal pericardium. Small basal lateral pericardial effusion without evidence of tamponade physiology. 7. Grossly, bilateral pleural effusion noted but no extracardiac findings. Recommended dedicated study if concerned for non-cardiac pathology. 8. Difficult acquisition in the setting of free breathing and AF RVR. IMPRESSION: 1. LVEF 35% in the setting of AF RVR. There is an inferolateral LGE pattern that is consistent with  prior infarct. In correlation with lab testing showing no acute infarction this admission, this is less likely a viable territory. 2.  RVEF 31% in the setting of AF RVR. 3.  Bilateral pleural effusions incidentally noted. Stanly Leavens MD Electronically Signed   By: Stanly Leavens M.D.   On: 02/29/2024 15:57        Weylyn Ricciuti M.D. [facility] 03/01/2024, 1:20 PM  Available via Epic secure chat 7am-7pm After 7 pm, please refer to night coverage provider listed on amion.

## 2024-03-02 ENCOUNTER — Encounter (HOSPITAL_COMMUNITY): Payer: Self-pay | Admitting: Cardiology

## 2024-03-02 ENCOUNTER — Inpatient Hospital Stay (HOSPITAL_COMMUNITY)

## 2024-03-02 DIAGNOSIS — R079 Chest pain, unspecified: Secondary | ICD-10-CM | POA: Diagnosis not present

## 2024-03-02 DIAGNOSIS — I4891 Unspecified atrial fibrillation: Secondary | ICD-10-CM | POA: Diagnosis not present

## 2024-03-02 DIAGNOSIS — I4892 Unspecified atrial flutter: Secondary | ICD-10-CM | POA: Diagnosis not present

## 2024-03-02 DIAGNOSIS — I5020 Unspecified systolic (congestive) heart failure: Secondary | ICD-10-CM | POA: Diagnosis not present

## 2024-03-02 LAB — RENAL FUNCTION PANEL
Albumin: 2.6 g/dL — ABNORMAL LOW (ref 3.5–5.0)
Anion gap: 9 (ref 5–15)
BUN: 29 mg/dL — ABNORMAL HIGH (ref 8–23)
CO2: 23 mmol/L (ref 22–32)
Calcium: 8.5 mg/dL — ABNORMAL LOW (ref 8.9–10.3)
Chloride: 105 mmol/L (ref 98–111)
Creatinine, Ser: 1.53 mg/dL — ABNORMAL HIGH (ref 0.61–1.24)
GFR, Estimated: 47 mL/min — ABNORMAL LOW (ref >60)
Glucose, Bld: 265 mg/dL — ABNORMAL HIGH (ref 70–99)
Phosphorus: 3 mg/dL (ref 2.5–4.6)
Potassium: 3.9 mmol/L (ref 3.5–5.1)
Sodium: 137 mmol/L (ref 135–145)

## 2024-03-02 LAB — CBC
HCT: 33.2 % — ABNORMAL LOW (ref 39.0–52.0)
Hemoglobin: 9.7 g/dL — ABNORMAL LOW (ref 13.0–17.0)
MCH: 21.7 pg — ABNORMAL LOW (ref 26.0–34.0)
MCHC: 29.2 g/dL — ABNORMAL LOW (ref 30.0–36.0)
MCV: 74.3 fL — ABNORMAL LOW (ref 80.0–100.0)
Platelets: 330 K/uL (ref 150–400)
RBC: 4.47 MIL/uL (ref 4.22–5.81)
RDW: 17.6 % — ABNORMAL HIGH (ref 11.5–15.5)
WBC: 10.2 K/uL (ref 4.0–10.5)
nRBC: 0 % (ref 0.0–0.2)

## 2024-03-02 LAB — GLUCOSE, CAPILLARY
Glucose-Capillary: 261 mg/dL — ABNORMAL HIGH (ref 70–99)
Glucose-Capillary: 271 mg/dL — ABNORMAL HIGH (ref 70–99)
Glucose-Capillary: 174 mg/dL — ABNORMAL HIGH (ref 70–99)
Glucose-Capillary: 163 mg/dL — ABNORMAL HIGH (ref 70–99)

## 2024-03-02 MED ORDER — RIVAROXABAN 20 MG PO TABS: 20.0000 mg | ORAL_TABLET | Freq: Every day | ORAL | Status: DC

## 2024-03-02 MED ORDER — DOXEPIN HCL 10 MG PO CAPS
10.0000 mg | ORAL_CAPSULE | Freq: Every day | ORAL | Status: DC
  Administered 2024-03-02 – 2024-03-08 (×7): 10 mg via ORAL
  Filled 2024-03-02 (×8): qty 1

## 2024-03-02 MED ORDER — ACETAMINOPHEN 325 MG PO TABS
975.0000 mg | ORAL_TABLET | Freq: Three times a day (TID) | ORAL | Status: AC
  Administered 2024-03-02 – 2024-03-05 (×9): 975 mg via ORAL
  Filled 2024-03-02 (×11): qty 3

## 2024-03-02 MED ORDER — TORSEMIDE 20 MG PO TABS
40.0000 mg | ORAL_TABLET | Freq: Two times a day (BID) | ORAL | Status: DC
  Administered 2024-03-02 – 2024-03-05 (×8): 40 mg via ORAL
  Filled 2024-03-02 (×9): qty 2

## 2024-03-02 MED ORDER — OXYCODONE HCL 5 MG PO TABS
5.0000 mg | ORAL_TABLET | Freq: Once | ORAL | Status: AC
  Administered 2024-03-02: 5 mg via ORAL
  Filled 2024-03-02: qty 1

## 2024-03-02 MED ORDER — RIVAROXABAN 20 MG PO TABS
20.0000 mg | ORAL_TABLET | Freq: Every day | ORAL | Status: DC
  Administered 2024-03-02 – 2024-03-08 (×7): 20 mg via ORAL
  Filled 2024-03-02 (×7): qty 1

## 2024-03-02 MED ORDER — AMIODARONE HCL 200 MG PO TABS
200.0000 mg | ORAL_TABLET | Freq: Two times a day (BID) | ORAL | Status: DC
  Administered 2024-03-02 – 2024-03-09 (×14): 200 mg via ORAL
  Filled 2024-03-02 (×14): qty 1

## 2024-03-02 MED ORDER — OXYBUTYNIN CHLORIDE ER 10 MG PO TB24
10.0000 mg | ORAL_TABLET | Freq: Every day | ORAL | Status: DC
  Administered 2024-03-02 – 2024-03-09 (×8): 10 mg via ORAL
  Filled 2024-03-02 (×8): qty 1

## 2024-03-02 MED ORDER — TORSEMIDE 20 MG PO TABS
40.0000 mg | ORAL_TABLET | Freq: Two times a day (BID) | ORAL | Status: DC
Start: 2024-03-02 — End: 2024-03-02

## 2024-03-02 MED ORDER — PANTOPRAZOLE SODIUM 40 MG PO TBEC
40.0000 mg | DELAYED_RELEASE_TABLET | Freq: Every day | ORAL | Status: DC
  Administered 2024-03-02 – 2024-03-09 (×8): 40 mg via ORAL
  Filled 2024-03-02 (×8): qty 1

## 2024-03-02 MED FILL — Midazolam HCl Inj PF 5 MG/5ML (Base Equivalent): INTRAMUSCULAR | Qty: 2.5 | Status: AC

## 2024-03-02 NOTE — Inpatient Diabetes Management (Signed)
 Inpatient Diabetes Program Recommendations  AACE/ADA: New Consensus Statement on Inpatient Glycemic Control (2015)  Target Ranges:  Prepandial:   less than 140 mg/dL      Peak postprandial:   less than 180 mg/dL (1-2 hours)      Critically ill patients:  140 - 180 mg/dL   Lab Results  Component Value Date   GLUCAP 271 (H) 03/02/2024   HGBA1C 6.7 (H) 01/04/2024    Review of Glycemic Control  Latest Reference Range & Units 03/01/24 13:55 03/01/24 16:08 03/01/24 21:42 03/02/24 06:10 03/02/24 11:21  Glucose-Capillary 70 - 99 mg/dL 814 (H) 751 (H) 768 (H) 261 (H) 271 (H)  (H): Data is abnormally high Diabetes history: DM2 Outpatient Diabetes medications: Tresiba  40 units in am, 46 units in pm, Novolog  sliding scale TID, Farxiga  Current orders for Inpatient glycemic control: Lantus  40 units daily (starting 9/17), Novolog  0-15 units correction scale TID, Novolog  0-5 units HS scale, Novolog  8 units TID with meals   Inpatient Diabetes Program Recommendations  Consider further increasing Lantus  to 50 every day.  Thanks, Tinnie Minus, MSN, RNC-OB Diabetes Coordinator [phone] (8a-5p)

## 2024-03-02 NOTE — Progress Notes (Signed)
 Progress Note   Patient: Mike Hunter FMW:[id number] DOB: 1951-03-27 DOA: 02/25/2024     5 DOS: the patient was seen and examined on 03/02/2024   Brief hospital course: 73 year old male with paroxysmal atrial fibrillation on Xarelto , chronic HFpEF, COPD, CKD stage IIIb diabetes mellitus type 2, HTN, osteoporosis, CAD status post PCI, obesity, OSA on CPAP presented to ED with palpitations for 1 week.  He also reported chest pain, pressure with radiation down the left arm, associated shortness of breath, prior to presenting to ED, heart rate was in 130s In ED, patient was found to have heart rate 131, BP 105/53 and was placed on Cardizem  drip cardiology was consulted.  Assessment and Plan: Principal Problem:   Atrial fibrillation with rapid ventricular response (HCC) - Initially placed on Cardizem  drip  - Torsemide , Aldactone , metoprolol , Imdur  were held due to borderline BP - 2D echo showed EF of 45 to 50%, global hypokinesis, diastolic parameters indeterminate -  DCCV on 9/12 but had a reccurrence of AF postprocedure on 9/13, started on amiodarone  drip by cardiology - EP cardiology consulted, continued amiodarone , plan for repeat DCCV this week   Wide-complex tachycardia, hemodynamically unstable, chest pain - On 9/13, patient had sudden onset of V. tach, rate 180s to 190s, seen by cardiology urgently and was given IV amiodarone  bolus x2, placed on amiodarone  drip - EP cardiology following, now s/p ICD on 9/16   Acute on chronic systolic and diastolic heart failure with volume overload - Noted to be in volume overload on 9/13, torsemide  and Aldactone  had been held on the admission due to borderline BP - Was on IV lasix , now on torsemide  BID per Cardiology   CAD status post PCI,  hypertension -Previous echo 12/2023 had shown EF of 55 to 60%, indeterminate left ventricular diastolic parameters, global hypokinesis, moderate asymmetric LVH of the septal segment.  Mild to moderate aortic  stenosis - Continue Plavix , cardiology following     COPD with mild exacerbation Leukocytosis, acute bronchitis -Improving, continue Pulmicort , Brovana , Xopenex  nebs -Troponin 0.71 -Placed on IV Unasyn , blood cultures NTD, leukocytosis resolved         Insulin -requiring or dependent type II diabetes mellitus (HCC), uncontrolled with hyperglycemia  -On high doses of insulin  outpatient, last A1c 6.7   CBG (last 3)  Recent Labs (last 2 labs)       Recent Labs    02/29/24 1602 02/29/24 2132 03/01/24 0606  GLUCAP 272* 227* 202*      -CBG still uncontrolled, increased Lantus  to 40 units daily, NovoLog  8 units 3 times daily AC, moderate SSI -Diabetic coordinator consult     Anemia due to stage 3b chronic kidney disease (HCC) -H&H currently stable.  Baseline hemoglobin 9-10   Mild acute on CKD stage IIIb - Baseline creatinine 1.4-1.7 likely worsened due to A-fib with RVR, now on diuresis  - Creatinine improving   Hyperlipidemia - Resume statin     Obesity class II Estimated body mass index is 39.48 kg/m as calculated from the following:   Height as of this encounter: 5' 10 (1.778 m).   Weight as of this encounter: 124.8 kg.         Subjective: Without complaints  Physical Exam: Vitals:   03/02/24 0413 03/02/24 0922 03/02/24 0933 03/02/24 1200  BP: 109/88 137/78 110/73 117/72  Pulse: 98 (!) 115    Resp: 16     Temp: 98.1 F (36.7 C) 97.6 F (36.4 C)  97.6 F (36.4 C)  TempSrc: Oral  Oral  Oral  SpO2: 98%     Weight:      Height:       General exam: Awake, laying in bed, in nad Respiratory system: Normal respiratory effort, no wheezing Cardiovascular system: regular rate, s1, s2 Gastrointestinal system: Soft, nondistended, positive BS Central nervous system: CN2-12 grossly intact, strength intact Extremities: Perfused, no clubbing Skin: Normal skin turgor, no notable skin lesions seen Psychiatry: Mood normal // no visual hallucinations   Data  Reviewed:  Labs reviewed: Na 137, K 3.9, Cr 1.53, WBC 10.2, Hgb 9.7, Plts 330  Family Communication: Pt in room, family at bedside  Disposition: Status is: Inpatient Remains inpatient appropriate because: severity of illness  Planned Discharge Destination: Home     Author: Garnette Pelt, MD 03/02/2024 3:39 PM  For on call review [email or web address].

## 2024-03-02 NOTE — Hospital Course (Addendum)
 73 year old male with paroxysmal atrial fibrillation on Xarelto , chronic HFpEF, COPD, CKD stage IIIb diabetes mellitus type 2, HTN, osteoporosis, CAD status post PCI, obesity, OSA on CPAP presented to ED with palpitations for 1 week.  He also reported chest pain, pressure with radiation down the left arm, associated shortness of breath, prior to presenting to ED, heart rate was in 130s In ED, patient was found to have heart rate 131, BP 105/53 and was placed on Cardizem  drip cardiology was consulted.  Assessment and Plan:    Atrial fibrillation with rapid ventricular response (HCC) - Initially placed on Cardizem  drip  - Torsemide , Aldactone , metoprolol , Imdur  were held due to borderline BP - 2D echo showed EF of 45 to 50%, global hypokinesis, diastolic parameters indeterminate -  DCCV on 9/12 but had a reccurrence of AF postprocedure on 9/13, started on amiodarone  drip by cardiology - EP cardiology consulted, continued amiodarone  - Pt now failed attempt at DCCV 9/19 -cont with anticoag with beta blocker, amiodarone . Cardiology plan cardioversion 3-4 weeks - Declined from SNF and wife declined appealing -Patient discharging home with home health   Wide-complex tachycardia, hemodynamically unstable, chest pain - On 9/13, patient had sudden onset of V. tach, rate 180s to 190s, seen by cardiology urgently and was given IV amiodarone  bolus x2, placed on amiodarone  drip - EP cardiology following, now s/p ICD on 9/16   Acute on chronic systolic and diastolic heart failure with volume overload - Noted to be in volume overload on 9/13, torsemide  and Aldactone  had been held on the admission due to borderline BP - Was on IV lasix , now on torsemide  BID per Cardiology   CAD status post PCI,  hypertension -Previous echo 12/2023 had shown EF of 55 to 60%, indeterminate left ventricular diastolic parameters, global hypokinesis, moderate asymmetric LVH of the septal segment.  Mild to moderate aortic  stenosis - Continue Plavix     COPD with mild exacerbation Leukocytosis, acute bronchitis -Improving, continue Pulmicort , Brovana , Xopenex  nebs -Troponin 0.71 -Completed IV Unasyn  9/20    Insulin -requiring or dependent type II diabetes mellitus (HCC), uncontrolled with hyperglycemia  - Resume home regimen   Anemia due to stage 3b chronic kidney disease (HCC) -H&H currently stable   Mild acute on CKD stage IIIb - Baseline creatinine 1.4-1.7 likely worsened due to A-fib with RVR, now on diuresis    Hyperlipidemia - Resume statin    Obesity class II Estimated body mass index is 39.48 kg/m as calculated from the following:   Height as of this encounter: 5' 10 (1.778 m).   Weight as of this encounter: 124.8 kg.

## 2024-03-02 NOTE — Progress Notes (Signed)
 Rounding Note   Patient Name: Mike Hunter Date of Encounter: 03/02/2024  [facility] Cardiologist:  Dr. Debora ([facility])   Subjective  Mild discomfort at implant site/pressure dressing  Scheduled Meds:  acetaminophen   975 mg Oral TID   amoxicillin -clavulanate  1 tablet Oral Q12H   arformoterol   15 mcg Nebulization BID   atorvastatin   80 mg Oral Daily   budesonide   2 mL Nebulization BID   clopidogrel   75 mg Oral Daily   donepezil   10 mg Oral QHS   insulin  aspart  0-15 Units Subcutaneous TID WC   insulin  aspart  0-5 Units Subcutaneous QHS   insulin  aspart  8 Units Subcutaneous TID WC   insulin  glargine  40 Units Subcutaneous Daily   isosorbide  mononitrate  30 mg Oral Daily   latanoprost   1 drop Both Eyes QHS   lidocaine   1 patch Transdermal Q24H   memantine   10 mg Oral BID   metoprolol  tartrate  25 mg Oral BID   mirabegron  ER  50 mg Oral Daily   montelukast   10 mg Oral QHS   rivaroxaban   20 mg Oral Q supper   sertraline   100 mg Oral Daily   sodium chloride  flush  3 mL Intravenous Q12H   spironolactone   25 mg Oral Daily   torsemide   40 mg Oral BID   Continuous Infusions:  amiodarone  30 mg/hr (03/02/24 1040)   PRN Meds: levalbuterol , ondansetron  **OR** ondansetron  (ZOFRAN ) IV   Vital Signs  Vitals:   03/02/24 0111 03/02/24 0413 03/02/24 0922 03/02/24 0933  BP:  109/88 137/78 110/73  Pulse:  98 (!) 115   Resp:  16    Temp:  98.1 F (36.7 C) 97.6 F (36.4 C)   TempSrc:  Oral Oral   SpO2: 96% 98%    Weight:      Height:        Intake/Output Summary (Last 24 hours) at 03/02/2024 1200 Last data filed at 03/02/2024 1100 Gross per 24 hour  Intake 988.15 ml  Output 3400 ml  Net -2411.85 ml      03/01/2024   12:23 AM 02/29/2024    4:47 AM 02/28/2024    4:30 AM  Last 3 Weights  Weight (lbs) 275 lb 2.2 oz 276 lb 0.3 oz 274 lb 14.6 oz  Weight (kg) 124.8 kg 125.2 kg 124.7 kg      Telemetry AFib 100's - Personally Reviewed  ECG  AFib 108bpm -  Personally Reviewed  Physical Exam  Patient was seen and examined this morning by Dr. Almetta GEN: No acute distress, poor dentition.   Neck: No JVD Cardiac: irreg-irreg, no murmurs, rubs, or gallops.  Respiratory: CTA b/l. GI: Soft, nontender, non-distended  MS: 1+ edema; No deformity. Neuro:  Nonfocal  Psych: Normal affect   ICD site: pressure dressing in place, no evidence of bleeding  Labs High Sensitivity Troponin:   Recent Labs  Lab 02/26/24 0702 02/27/24 1437 02/27/24 1559 02/28/24 1514  TROPONINIHS 14 27* 30* 23*     Chemistry Recent Labs  Lab 02/25/24 1558 02/26/24 0240 02/27/24 0237 02/27/24 1437 02/28/24 0340 02/29/24 0239 03/01/24 0857 03/02/24 0238  NA  --  140   < > 136   < > 137 139 137  K  --  3.5   < > 3.8   < > 3.5 3.6 3.9  CL  --  107   < > 104   < > 104 104 105  CO2  --  21*   < >  20*   < > 22 25 23   GLUCOSE  --  105*   < > 327*   < > 247* 192* 265*  BUN  --  23   < > 20   < > 22 24* 29*  CREATININE  --  1.47*   < > 1.65*   < > 1.67* 1.28* 1.53*  CALCIUM   --  7.9*   < > 8.3*   < > 8.2* 8.6* 8.5*  MG  --   --   --  2.1  --   --   --   --   PROT 7.3 6.3*  --  6.7  --   --   --   --   ALBUMIN  4.1 3.0*   < > 2.9*   < > 2.6* 2.5* 2.6*  AST 20 18  --  17  --   --   --   --   ALT 22 20  --  19  --   --   --   --   ALKPHOS 113 82  --  80  --   --   --   --   BILITOT 0.4 0.7  --  1.2  --   --   --   --   GFRNONAA  --  49*   < > 43*   < > 42* 58* 47*  ANIONGAP  --  12   < > 12   < > 11 10 9    < > = values in this interval not displayed.    Lipids No results for input(s): CHOL, TRIG, HDL, LABVLDL, LDLCALC, CHOLHDL in the last 168 hours.  Hematology Recent Labs  Lab 02/29/24 0239 03/01/24 0857 03/02/24 0238  WBC 13.7* 8.7 10.2  RBC 4.29 4.45 4.47  HGB 9.4* 9.8* 9.7*  HCT 32.1* 33.2* 33.2*  MCV 74.8* 74.6* 74.3*  MCH 21.9* 22.0* 21.7*  MCHC 29.3* 29.5* 29.2*  RDW 17.5* 17.6* 17.6*  PLT 268 277 330   Thyroid  Recent Labs   Lab 02/26/24 0240  TSH 1.871    BNP Recent Labs  Lab 02/25/24 1520 02/27/24 1103  BNP  --  222.0*  PROBNP 2,137.0*  --     DDimer No results for input(s): DDIMER in the last 168 hours.   Radiology    Cardiac Studies  C.MRI 02/29/24 IMPRESSION: 1. LVEF 35% in the setting of AF RVR. 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 In correlation with lab testing showing no acute infarction this admission, this is less likely a viable territory.   2.  RVEF 31% in the setting of AF RVR.   3.  Bilateral pleural effusions incidentally noted.   02/26/24: TTE 1. Left ventricular ejection fraction, by estimation, is 45 to 50%. The  left ventricle has mildly decreased function. The left ventricle  demonstrates global hypokinesis. The left ventricular internal cavity size  was mildly dilated. Left ventricular  diastolic parameters are indeterminate.   2. Right ventricular systolic function is normal. The right ventricular  size is normal. There is mildly elevated pulmonary artery systolic  pressure. The estimated right ventricular systolic pressure is 37.8 mmHg.   3. Left atrial size was mildly dilated.   4. The mitral valve is normal in structure. Mild mitral valve  regurgitation.   5. The aortic valve is calcified. Aortic valve regurgitation is not  visualized. Mild to moderate aortic valve stenosis. Vmax 2.8 m/s, MG 21  mmHg,  AVA 1.6 cm^2, DI 0.45   6. The inferior vena cava is normal in size with greater than 50%  respiratory variability, suggesting right atrial pressure of 3 mmHg.      11/06/23: LHC/PCI DIAGNOSTIC FINDINGS:    1.  Borderline stenosis of the mid LAD.  RFR abnormal, 0.73.  2.  Otherwise nonobstructive CAD.  3.  Aortic stenosis with mean gradient 23 mmHg by pullback.   PCI:    Successful angioplasty and stenting of the LAD with 2 overlapping stents.   The area of borderline stenosis was focal, but there is atherosclerosis   throughout the LAD which required long segment stenting.  A 2.75 x 48 mm  had a 3.5 x 38 mm Xience drug-eluting stent were used.  This was  aggressively postdilated with noncompliant balloons and IVUS was used for  stent optimization.      Dobutamine stress echo 05/27/2023: The patient had 12-21-08 chest pain, central tightness and heaviness.  Baseline basal inferior and  inferolateral hypokinesis became akinetic [possibly hibernating  myocardium], baseline normal  anterior wall became hypokinetic [?], likely severe stenosis of dominant  LCfx, vs RCA and Cfx  stenosis. Test stopped due to severe CP and evidence of ischemia. The  patient achieved 51 % of  maximum predicted heart rate.  AVA 1.2 cm at rest and after dobutamine with adequate increase in SV-  Moderate AS.    TTE 03/03/2023: Left ventricular systolic function is normal.  LV ejection fraction = 55-60%.  There are regional wall motion abnormalities as specified below.  There is basal LV inferolateral wall hypokinesis  There is mid LV inferolateral wall hypokinesis  There is moderate to severe aortic stenosis.  Aortic valve mean pressure gradient is 19 mmHg.  Aortic valve peak pressure gradient is 32 mmHg.  The calculated aortic valve area using the continuity equation is 0.81  cm2.  AS dimensionless index  VTI  is 0.27.  There is mild mitral regurgitation.  There is trace tricuspid regurgitation.  Trace pulmonic valvular regurgitation.  IVC size was normal.  There is no pericardial effusion.  Compared to prior study, aortic valve stenosis has worsened. Wall motion  abnormality is new.    Lexiscan Stress test 03/03/2023: Severe chest pain during Lexiscan injection. 1. No reversible ischemia or infarction.  2. Normal left ventricular wall motion.  3. Left ventricular ejection fraction 51%  4. Non invasive risk stratification*: Low  MRA neck 03/03/2023: 1. 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 Otherwise, normal brain MRI with no acute intracranial pathology. 2. Patent intracranial vasculature without significant stenosis or occlusion. 3. 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 4. Otherwise, patent vasculature of the neck with no evidence of hemodynamically significant stenosis or occlusion   LHC 01/19/2018: LMCA  Normal appearance with 0% stenosis.  LAD    Lesion on Prox LAD  10% stenosis 5 mm length .    Lesion on Mid LAD  20% stenosis 10 mm length .  LCx  Normal appearance with 0% stenosis.  RCA  Normal appearance with 0% stenosis.       Patient Profile   73 y.o. 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) HFpEF   Admitted 02/26/24 this admission, reporting a few weeks of more Afib, the  day of admission became profoundly weak  after a BM > home check on vitals noted widely variable HRs 40's - 140s Found in rapid AFib/flutter > dilt gtt with better rate control DCCV > SR on 9/12 9/13 > developed WCT 180s associated with hypotension and severe CP > amio bolus Planned for urgent CV > sedated > O2 > spontaneous CV to AFib slower Recurrent WCT > amio gtt/dig Trops not particularly impressive 27 >30   He is also being treated for COPD/bronchitis (leukocytosis) Diuretics for volume LVEF 45-50% C.MRI LVEF 35% w/scar  Assessment & Plan   WCT c/w VT Amiodarone  gtt continues Likely scar mediated rather then ischemic driven Now s/p ICD implant Pressure dressing in place (given plavix  and need to resume OAC) Appears stable with no evidence of bleeding Device check this morning with stable measurements CXR this morning without PTX Usual post implant follow up is in place Transition amiodarone  gtt > PO 200mg  BID  for 1 week > then daily    2.  AFib CHA2DS2Vasc is 6 Held his Xarelto  > heparin  for procedure/ICD,  He is on Plavix  (overlapping DES May 2025) will not interrupt Rates are better Heparin  gtt off this Am for implant Resume Xarelto  tonight He did have DCCV this admission > settled back in to Afib +/- TEE/DCCV prior to discharge on amiodarone  Deferred to attending cards team  3. COPD/asthma, bronchitis On Abx Afebrile, leukocytosis > on the down trend C/w IM  4. Acute/chronic HF cumulatively neg 14,865ml C/w attending cards team  For questions or updates, please contact [facility] Please consult [email or web address] for contact info under     Signed, Charlies Macario Arthur, PA-C  03/02/2024, 12:00 PM

## 2024-03-02 NOTE — Progress Notes (Signed)
 Pt placed on CPAP at this time.   03/02/24 2155  BiPAP/CPAP/SIPAP  BiPAP/CPAP/SIPAP Pt Type Adult  BiPAP/CPAP/SIPAP Resmed  Mask Type Full face mask  Dentures removed? Not applicable  Mask Size Large  Respiratory Rate 18 breaths/min  EPAP 10 cmH2O  FiO2 (%) 32 %  Flow Rate 3 lpm  Minute Ventilation 19  Leak 11  Peak Inspiratory Pressure (PIP) 17  Tidal Volume (Vt) 601  Patient Home Machine No  Patient Home Mask No  Patient Home Tubing No  Auto Titrate No  Minimum cmH2O 5 cmH2O  Maximum cmH2O 10 cmH2O  Nasal massage performed Yes  CPAP/SIPAP surface wiped down Yes  Device Plugged into [employer] Power Outlet Yes

## 2024-03-02 NOTE — Progress Notes (Signed)
 Progress Note  Patient Name: Mike Hunter Date of Encounter: 03/02/2024 Primary Cardiologist: [facility] (Dr. Debora)   Subjective   S/p ICD.  Feels better.  Was told that the device is doing everything it is supposed to.  Vital Signs    Vitals:   03/01/24 2027 03/01/24 2034 03/02/24 0111 03/02/24 0413  BP:    109/88  Pulse:    98  Resp:  20  16  Temp:    98.1 F (36.7 C)  TempSrc:    Oral  SpO2: 96%  96% 98%  Weight:      Height:        Intake/Output Summary (Last 24 hours) at 03/02/2024 0919 Last data filed at 03/02/2024 0417 Gross per 24 hour  Intake 477 ml  Output 2950 ml  Net -2473 ml   Filed Weights   02/28/24 0430 02/29/24 0447 03/01/24 0023  Weight: 124.7 kg 125.2 kg 124.8 kg    Physical Exam   GEN: No acute distress.   Neck: No JVD Cardiac: IRIR tachycardia no rubs, or gallops. Systolic murmur Respiratory: CTAB, no tachypnea today GI: Soft, nontender, distended with fluid wave MS: No edema  No clear pocket hematoma  Labs   Telemetry: AF RVR with rare PVCs   Chemistry Recent Labs  Lab 02/25/24 1558 02/26/24 0240 02/27/24 0237 02/27/24 1437 02/28/24 0340 02/29/24 0239 03/01/24 0857 03/02/24 0238  NA  --  140   < > 136   < > 137 139 137  K  --  3.5   < > 3.8   < > 3.5 3.6 3.9  CL  --  107   < > 104   < > 104 104 105  CO2  --  21*   < > 20*   < > 22 25 23   GLUCOSE  --  105*   < > 327*   < > 247* 192* 265*  BUN  --  23   < > 20   < > 22 24* 29*  CREATININE  --  1.47*   < > 1.65*   < > 1.67* 1.28* 1.53*  CALCIUM   --  7.9*   < > 8.3*   < > 8.2* 8.6* 8.5*  PROT 7.3 6.3*  --  6.7  --   --   --   --   ALBUMIN  4.1 3.0*   < > 2.9*   < > 2.6* 2.5* 2.6*  AST 20 18  --  17  --   --   --   --   ALT 22 20  --  19  --   --   --   --   ALKPHOS 113 82  --  80  --   --   --   --   BILITOT 0.4 0.7  --  1.2  --   --   --   --   GFRNONAA  --  49*   < > 43*   < > 42* 58* 47*  ANIONGAP  --  12   < > 12   < > 11 10 9    < > = values in this interval not  displayed.     Hematology Recent Labs  Lab 02/29/24 0239 03/01/24 0857 03/02/24 0238  WBC 13.7* 8.7 10.2  RBC 4.29 4.45 4.47  HGB 9.4* 9.8* 9.7*  HCT 32.1* 33.2* 33.2*  MCV 74.8* 74.6* 74.3*  MCH 21.9* 22.0* 21.7*  MCHC 29.3* 29.5* 29.2*  RDW 17.5* 17.6* 17.6*  PLT 268 277 330   BNP Recent Labs  Lab 02/25/24 1520 02/27/24 1103  BNP  --  222.0*  PROBNP 2,137.0*  --      Cardiac Studies   Cardiac Studies & Procedures   ______________________________________________________________________________________________     ECHOCARDIOGRAM  ECHOCARDIOGRAM COMPLETE 02/26/2024  Narrative ECHOCARDIOGRAM REPORT    Patient Name:   Mike Hunter Date of Exam: 02/26/2024 Medical Rec #:  [id number]     Height:       70.0 in Accession #:    [id number]    Weight:       275.1 lb Date of Birth:  1950/12/21      BSA:          2.390 m Patient Age:    73 years      BP:           111/75 mmHg Patient Gender: M             HR:           87 bpm. Exam Location:  Inpatient  Procedure: 2D Echo and Intracardiac Opacification Agent (Both Spectral and Color Flow Doppler were utilized during procedure).  Indications:     Dyspnea  History:         Patient has prior history of Echocardiogram examinations. Aortic Valve Disease, Arrythmias:Atrial Fibrillation; Signs/Symptoms:Dyspnea.  Sonographer:     Charmaine Gaskins Referring Phys:  [id number] A THOMAS Diagnosing Phys: Lonni Nanas MD  IMPRESSIONS   1. Left ventricular ejection fraction, by estimation, is 45 to 50%. The left ventricle has mildly decreased function. The left ventricle demonstrates global hypokinesis. The left ventricular internal cavity size was mildly dilated. Left ventricular diastolic parameters are indeterminate. 2. Right ventricular systolic function is normal. The right ventricular size is normal. There is mildly elevated pulmonary artery systolic pressure. The estimated right ventricular systolic  pressure is 37.8 mmHg. 3. Left atrial size was mildly dilated. 4. The mitral valve is normal in structure. Mild mitral valve regurgitation. 5. The aortic valve is calcified. Aortic valve regurgitation is not visualized. Mild to moderate aortic valve stenosis. Vmax 2.8 m/s, MG 21 mmHg, AVA 1.6 cm^2, DI 0.45 6. The inferior vena cava is normal in size with greater than 50% respiratory variability, suggesting right atrial pressure of 3 mmHg.  FINDINGS Left Ventricle: Left ventricular ejection fraction, by estimation, is 45 to 50%. The left ventricle has mildly decreased function. The left ventricle demonstrates global hypokinesis. Definity  contrast agent was given IV to delineate the left ventricular endocardial borders. The left ventricular internal cavity size was mildly dilated. There is no left ventricular hypertrophy. Left ventricular diastolic parameters are indeterminate.  Right Ventricle: The right ventricular size is normal. No increase in right ventricular wall thickness. Right ventricular systolic function is normal. There is mildly elevated pulmonary artery systolic pressure. The tricuspid regurgitant velocity is 2.95 m/s, and with an assumed right atrial pressure of 3 mmHg, the estimated right ventricular systolic pressure is 37.8 mmHg.  Left [facility]: Left atrial size was mildly dilated.  Right [facility]: Right atrial size was normal in size.  Pericardium: Trivial pericardial effusion is present.  Mitral Valve: The mitral valve is normal in structure. Mild mitral valve regurgitation.  Tricuspid Valve: The tricuspid valve is normal in structure. Tricuspid valve regurgitation is mild.  Aortic Valve: The aortic valve is calcified. Aortic valve regurgitation is not visualized. Mild to moderate aortic stenosis is present. Aortic valve mean gradient measures 18.6  mmHg. Aortic valve peak gradient measures 27.5 mmHg. Aortic valve area, by VTI measures 1.39 cm.  Pulmonic Valve: The pulmonic  valve was not well visualized. Pulmonic valve regurgitation is not visualized.  Aorta: The aortic root and ascending aorta are structurally normal, with no evidence of dilitation.  Venous: The inferior vena cava is normal in size with greater than 50% respiratory variability, suggesting right atrial pressure of 3 mmHg.  IAS/Shunts: The atrial septum is grossly normal.   LEFT VENTRICLE PLAX 2D LVIDd:         6.00 cm      Diastology LVIDs:         4.60 cm      LV e' medial:    7.87 cm/s LV PW:         0.90 cm      LV E/e' medial:  14.2 LV IVS:        1.00 cm      LV e' lateral:   8.01 cm/s LVOT diam:     2.20 cm      LV E/e' lateral: 13.9 LV SV:         77 LV SV Index:   32 LVOT Area:     3.80 cm  LV Volumes (MOD) LV vol d, MOD A2C: 122.0 ml LV vol d, MOD A4C: 121.0 ml LV vol s, MOD A2C: 43.7 ml LV vol s, MOD A4C: 76.9 ml LV SV MOD A2C:     78.3 ml LV SV MOD A4C:     121.0 ml LV SV MOD BP:      64.1 ml  RIGHT VENTRICLE RV Basal diam:  3.50 cm RV Mid diam:    3.60 cm RV S prime:     11.20 cm/s  LEFT ATRIUM             Index        RIGHT ATRIUM           Index LA diam:        4.80 cm 2.01 cm/m   RA Area:     19.10 cm LA Vol (A2C):   89.8 ml 37.57 ml/m  RA Volume:   47.80 ml  20.00 ml/m LA Vol (A4C):   87.9 ml 36.77 ml/m LA Biplane Vol: 91.9 ml 38.45 ml/m AORTIC VALVE AV Area (Vmax):    1.52 cm AV Area (Vmean):   1.39 cm AV Area (VTI):     1.39 cm AV Vmax:           262.40 cm/s AV Vmean:          209.000 cm/s AV VTI:            0.554 m AV Peak Grad:      27.5 mmHg AV Mean Grad:      18.6 mmHg LVOT Vmax:         104.92 cm/s LVOT Vmean:        76.200 cm/s LVOT VTI:          0.202 m LVOT/AV VTI ratio: 0.36  AORTA Ao Asc diam: 3.40 cm  MITRAL VALVE                TRICUSPID VALVE MV Area (PHT): 4.70 cm     TR Peak grad:   34.8 mmHg MV Decel Time: 161 msec     TR Vmax:        295.00 cm/s MV E velocity: 111.67 cm/s SHUNTS Systemic VTI:  0.20 m Systemic  Diam: 2.20 cm  Lonni Nanas MD Electronically signed by Lonni Nanas MD Signature Date/Time: 02/26/2024/2:34:07 PM    Final (Updated)        CARDIAC MRI  MR CARDIAC MORPHOLOGY W WO CONTRAST 02/29/2024  Narrative CLINICAL DATA:  Clinical question of scar formation Study assumes HCT of 32 and BSA of 2.49 m2.  EXAM: CARDIAC MRI  TECHNIQUE: The patient was scanned on a 1.5 Tesla GE magnet. A dedicated cardiac coil was used. Functional imaging was done using Fiesta sequences. 2,3, and 4 chamber views were done to assess for RWMA's. Modified Simpson's rule using was used to calculate an ejection fraction on a dedicated work [employer]. The patient received 10 cc of Gadavist . After 10 minutes inversion recovery sequences were used to assess for infiltration and scar tissue. Flow quantification was performed 2 times during this examination with flow quantification performed at the levels of the ascending aorta above the valve, pulmonary artery above the valve.  CONTRAST:  10 cc  of Gadavist   FINDINGS: 1. Normal left ventricular size, with LVEDD 55 mm, and LVEDVi 59 mL/m2.  Normal left ventricular thickness.  Moderate decrease ventricular systolic function (LVEF =35%). Patient is free breathing and in a irregular rapid rhythm. Grossly suggestive of global hypokinesis. Unable to perform accurate strain.  Left ventricular parametric mapping notable for elevated native T1 in the basal lateral (1372 ms), poor post contrast T1 comparison for ECV, and increased T2 in the basal lateral (59 ms).  There is late gadolinium enhancement in the left ventricular myocardium, that is subendocardial, in the inferior lateral wall. This accounts for 75% of the wall.  Cannot exclude abnormal perfusion in the mid inferolateral.  2. Normal right ventricular size with RVEDVI 53 mL/m2.  Normal right ventricular thickness.  Moderate decrease in the right  ventricular systolic function (RVEF =31%). There are no regional wall motion abnormalities or aneurysms but global hypokinesis.  3.  Normal right atrial size with left atrial enlargement.  4. Normal size of the aortic root, ascending aorta. Moderate dilation of the main pulmonary artery, 31 mm.  5. Valve assessment:  Aortic Valve: Tri-leaflet and calcified valve. Mean gradient 4 mm Hg using anti-aliasing. Regurgitant fraction of 21% may overestimate regurgitation in the setting of RVR acquisition.  Pulmonic Valve: Mild regurgitation, regurgitant fraction of 8% may overestimate regurgitation in the setting of RVR acquisition.  Tricuspid Valve: Sub-optimal view of valve morphology. Qualitatively mild, central regurgitation.  Mitral Valve: Mild central regurgitation. Regurgitant fraction of 13%.  6. Normal pericardium. Small basal lateral pericardial effusion without evidence of tamponade physiology.  7. Grossly, bilateral pleural effusion noted but no extracardiac findings. Recommended dedicated study if concerned for non-cardiac pathology.  8. Difficult acquisition in the setting of free breathing and AF RVR.  IMPRESSION: 1. LVEF 35% in the setting of AF RVR. 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 In correlation with lab testing showing no acute infarction this admission, this is less likely a viable territory.  2.  RVEF 31% in the setting of AF RVR.  3.  Bilateral pleural effusions incidentally noted.  Stanly Leavens MD   Electronically Signed By: Stanly Leavens M.D. 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      Stanly Leavens, MD FASE [facility] Cardiologist [facility]  [address] [place], [address] [place], [place] [address] [phone]  9:19 AM

## 2024-03-02 NOTE — Discharge Instructions (Signed)
 After Your ICD (Implantable Cardiac Defibrillator)   You have a [employer] ICD  If you have a Medtronic or Biotronik device, plug in your home monitor once you get home, and no manual interaction is required.   If you have an [employer] or [employer] device, plug your home monitor once you get home, sit near the device, and press the large activation button. Sit nearby until the process is complete, usually notated by lights on the monitor.   If you were set up for monitoring using an app on your phone, make sure the app remains open in the background and the Bluetooth remains on.  ACTIVITY Do not lift your arm above shoulder height for 1 week after your procedure. After 7 days, you may progress as below.  You should remove your sling 24 hours after your procedure, unless otherwise instructed by your provider.     Wednesday March 09, 2024  Thursday March 10, 2024 Friday March 11, 2024 Saturday March 12, 2024   Do not lift, push, pull, or carry anything over 10 pounds with the affected arm until 6 weeks (Wednesday April 13, 2024 ) after your procedure.   You may drive AFTER your wound check, unless you have been told otherwise by your provider.   Ask your healthcare provider when you can go back to work   INCISION/Dressing If you are on a blood thinner such as Coumadin, Xarelto, Eliquis, Plavix, or Pradaxa please confirm with your provider when this should be resumed.   If large square, outer bandage is left in place, this can be removed after 24 hours from your procedure. Do not remove steri-strips or glue as below.   Monitor your defibrillator site for redness, swelling, and drainage. Call the device clinic at [phone] if you experience these symptoms or fever/chills.  If your incision is sealed with Steri-strips or staples, you may shower 7 days after your procedure or when told by your provider. Do not remove the steri-strips or let the shower hit  directly on your site. You may wash around your site with soap and water.    If you were discharged in a sling, please do not wear this during the day more than 48 hours after your surgery unless otherwise instructed. This may increase the risk of stiffness and soreness in your shoulder.   Avoid lotions, ointments, or perfumes over your incision until it is well-healed.  You may use a hot tub or a pool AFTER your wound check appointment if the incision is completely closed.  Your ICD is designed to protect you from life threatening heart rhythms. Because of this, you may receive a shock.   1 shock with no symptoms:  Call the office during business hours. 1 shock with symptoms (chest pain, chest pressure, dizziness, lightheadedness, shortness of breath, overall feeling unwell):  Call 911. If you experience 2 or more shocks in 24 hours:  Call 911. If you receive a shock, you should not drive for 6 months per the [place] DMV IF you receive appropriate therapy from your ICD.   ICD Alerts:  Some alerts are vibratory and others beep. These are NOT emergencies. Please call our office to let us  know. If this occurs at night or on weekends, it can wait until the next business day. Send a remote transmission.  If your device is capable of reading fluid status (for heart failure), you will be offered monthly monitoring to review this with you.   DEVICE MANAGEMENT Remote  monitoring is used to monitor your ICD from home. This monitoring is scheduled every 91 days by our office. It allows us  to keep an eye on the functioning of your device to ensure it is working properly. You will routinely see your Electrophysiologist annually (more often if necessary). This will appear as a REMOTE check on your MyChart schedule. These are automatic and there is nothing for you to manually do unless otherwise instructed.  You should receive your ID card for your new device in 4-8 weeks. Keep this card with you at all times once  received. Consider wearing a medical alert bracelet or necklace.  Your ICD  may be MRI compatible. This will be discussed at your next office visit/wound check.  You should avoid contact with strong electric or magnetic fields.   Do not use amateur (ham) radio equipment or electric (arc) welding torches. MP3 player headphones with magnets should not be used. Some devices are safe to use if held at least 12 inches (30 cm) from your defibrillator. These include power tools, lawn mowers, and speakers. If you are unsure if something is safe to use, ask your health care provider.  When using your cell phone, hold it to the ear that is on the opposite side from the defibrillator. Do not leave your cell phone in a pocket over the defibrillator.  You may safely use electric blankets, heating pads, computers, and microwave ovens.  Call the office right away if: You have chest pain. You feel more than one shock. You feel more short of breath than you have felt before. You feel more light-headed than you have felt before. Your incision starts to open up.  This information is not intended to replace advice given to you by your health care provider. Make sure you discuss any questions you have with your health care provider.

## 2024-03-03 DIAGNOSIS — I4892 Unspecified atrial flutter: Secondary | ICD-10-CM | POA: Diagnosis not present

## 2024-03-03 DIAGNOSIS — I5020 Unspecified systolic (congestive) heart failure: Secondary | ICD-10-CM | POA: Diagnosis not present

## 2024-03-03 DIAGNOSIS — I4891 Unspecified atrial fibrillation: Secondary | ICD-10-CM | POA: Diagnosis not present

## 2024-03-03 DIAGNOSIS — R079 Chest pain, unspecified: Secondary | ICD-10-CM | POA: Diagnosis not present

## 2024-03-03 DIAGNOSIS — I2510 Atherosclerotic heart disease of native coronary artery without angina pectoris: Secondary | ICD-10-CM | POA: Diagnosis not present

## 2024-03-03 LAB — CBC
HCT: 33.4 % — ABNORMAL LOW (ref 39.0–52.0)
Hemoglobin: 10 g/dL — ABNORMAL LOW (ref 13.0–17.0)
MCH: 22 pg — ABNORMAL LOW (ref 26.0–34.0)
MCHC: 29.9 g/dL — ABNORMAL LOW (ref 30.0–36.0)
MCV: 73.6 fL — ABNORMAL LOW (ref 80.0–100.0)
Platelets: 318 K/uL (ref 150–400)
RBC: 4.54 MIL/uL (ref 4.22–5.81)
RDW: 17.6 % — ABNORMAL HIGH (ref 11.5–15.5)
WBC: 10.3 K/uL (ref 4.0–10.5)
nRBC: 0 % (ref 0.0–0.2)

## 2024-03-03 LAB — COMPREHENSIVE METABOLIC PANEL WITH GFR
ALT: 17 U/L (ref 0–44)
AST: 16 U/L (ref 15–41)
Albumin: 2.7 g/dL — ABNORMAL LOW (ref 3.5–5.0)
Alkaline Phosphatase: 79 U/L (ref 38–126)
Anion gap: 17 — ABNORMAL HIGH (ref 5–15)
BUN: 31 mg/dL — ABNORMAL HIGH (ref 8–23)
CO2: 22 mmol/L (ref 22–32)
Calcium: 8.6 mg/dL — ABNORMAL LOW (ref 8.9–10.3)
Chloride: 100 mmol/L (ref 98–111)
Creatinine, Ser: 1.74 mg/dL — ABNORMAL HIGH (ref 0.61–1.24)
GFR, Estimated: 40 mL/min — ABNORMAL LOW (ref >60)
Glucose, Bld: 196 mg/dL — ABNORMAL HIGH (ref 70–99)
Potassium: 3.6 mmol/L (ref 3.5–5.1)
Sodium: 139 mmol/L (ref 135–145)
Total Bilirubin: 0.6 mg/dL (ref 0.0–1.2)
Total Protein: 6.7 g/dL (ref 6.5–8.1)

## 2024-03-03 LAB — GLUCOSE, CAPILLARY
Glucose-Capillary: 199 mg/dL — ABNORMAL HIGH (ref 70–99)
Glucose-Capillary: 225 mg/dL — ABNORMAL HIGH (ref 70–99)
Glucose-Capillary: 286 mg/dL — ABNORMAL HIGH (ref 70–99)
Glucose-Capillary: 196 mg/dL — ABNORMAL HIGH (ref 70–99)

## 2024-03-03 MED ORDER — METOPROLOL SUCCINATE ER 50 MG PO TB24
50.0000 mg | ORAL_TABLET | Freq: Every day | ORAL | Status: DC
  Administered 2024-03-03 – 2024-03-08 (×6): 50 mg via ORAL
  Filled 2024-03-03 (×6): qty 1

## 2024-03-03 MED ORDER — SODIUM CHLORIDE 0.9 % IV SOLN: INTRAVENOUS | Status: DC

## 2024-03-03 NOTE — Progress Notes (Signed)
 Pressure dressing this AM, loose with some food stuck to it Removed Site looks great, no bleeding, swelling Reapplied new pressure dressing Plans to remove tomorrow prior to discharge  In prior d/w Dr. Santo, tentatively planned for TEE/DCCV tomorrow after he has gotten a few doses of Xarelto  back on board >> ultimately deferred to him/general cards service.  Reminded patient LUE restrictions  Telemetry overnight noted some V pacing/tracking >> follow burden of this Getting him back in SR is ultimate goal Continue amiodarone    Mike Arthur, PA-C

## 2024-03-03 NOTE — Progress Notes (Signed)
 Progress Note  Patient Name: Mike Hunter Date of Encounter: 03/03/2024 Primary Cardiologist: [facility] (Dr. Debora)   Subjective   Stated on [facility] with no issues. Doing well with oral diuresis. Creatinine has increased and he is worried about future HD.  Vital Signs    Vitals:   03/03/24 0300 03/03/24 0336 03/03/24 0400 03/03/24 0730  BP:  122/84  109/69  Pulse:  95  (!) 113  Resp: 18 18 17 18   Temp:    98.1 F (36.7 C)  TempSrc:    Oral  SpO2:  100%  92%  Weight:      Height:        Intake/Output Summary (Last 24 hours) at 03/03/2024 0903 Last data filed at 03/03/2024 0606 Gross per 24 hour  Intake 981.02 ml  Output 3850 ml  Net -2868.98 ml   Filed Weights   02/28/24 0430 02/29/24 0447 03/01/24 0023  Weight: 124.7 kg 125.2 kg 124.8 kg    Physical Exam   GEN: No acute distress.   Neck: No JVD (supine) Cardiac: IRIR tachycardia no rubs, or gallops. Systolic murmur Respiratory: CTAB GI: Soft, nontender, distended with fluid wave improved MS: No edema  Labs   Telemetry: AF RVR with som V pacing; rhythm 110 bpm   Chemistry Recent Labs  Lab 02/26/24 0240 02/27/24 0237 02/27/24 1437 02/28/24 0340 03/01/24 0857 03/02/24 0238 03/03/24 0240  NA 140   < > 136   < > 139 137 139  K 3.5   < > 3.8   < > 3.6 3.9 3.6  CL 107   < > 104   < > 104 105 100  CO2 21*   < > 20*   < > 25 23 22   GLUCOSE 105*   < > 327*   < > 192* 265* 196*  BUN 23   < > 20   < > 24* 29* 31*  CREATININE 1.47*   < > 1.65*   < > 1.28* 1.53* 1.74*  CALCIUM  7.9*   < > 8.3*   < > 8.6* 8.5* 8.6*  PROT 6.3*  --  6.7  --   --   --  6.7  ALBUMIN  3.0*   < > 2.9*   < > 2.5* 2.6* 2.7*  AST 18  --  17  --   --   --  16  ALT 20  --  19  --   --   --  17  ALKPHOS 82  --  80  --   --   --  79  BILITOT 0.7  --  1.2  --   --   --  0.6  GFRNONAA 49*   < > 43*   < > 58* 47* 40*  ANIONGAP 12   < > 12   < > 10 9 17*   < > = values in this interval not displayed.     Hematology Recent Labs  Lab  03/01/24 0857 03/02/24 0238 03/03/24 0240  WBC 8.7 10.2 10.3  RBC 4.45 4.47 4.54  HGB 9.8* 9.7* 10.0*  HCT 33.2* 33.2* 33.4*  MCV 74.6* 74.3* 73.6*  MCH 22.0* 21.7* 22.0*  MCHC 29.5* 29.2* 29.9*  RDW 17.6* 17.6* 17.6*  PLT 277 330 318   BNP Recent Labs  Lab 02/25/24 1520 02/27/24 1103  BNP  --  222.0*  PROBNP 2,137.0*  --      Cardiac Studies   Cardiac Studies & Procedures  ______________________________________________________________________________________________     ECHOCARDIOGRAM  ECHOCARDIOGRAM COMPLETE 02/26/2024  Narrative ECHOCARDIOGRAM REPORT    Patient Name:   Mike Hunter Date of Exam: 02/26/2024 Medical Rec #:  [id number]     Height:       70.0 in Accession #:    [id number]    Weight:       275.1 lb Date of Birth:  [birth year]      BSA:          2.390 m Patient Age:    73 years      BP:           111/75 mmHg Patient Gender: M             HR:           87 bpm. Exam Location:  Inpatient  Procedure: 2D Echo and Intracardiac Opacification Agent (Both Spectral and Color Flow Doppler were utilized during procedure).  Indications:     Dyspnea  History:         Patient has prior history of Echocardiogram examinations. Aortic Valve Disease, Arrythmias:Atrial Fibrillation; Signs/Symptoms:Dyspnea.  Sonographer:     Charmaine Gaskins Referring Phys:  [id number] A THOMAS Diagnosing Phys: Lonni Nanas MD  IMPRESSIONS   1. Left ventricular ejection fraction, by estimation, is 45 to 50%. The left ventricle has mildly decreased function. The left ventricle demonstrates global hypokinesis. The left ventricular internal cavity size was mildly dilated. Left ventricular diastolic parameters are indeterminate. 2. Right ventricular systolic function is normal. The right ventricular size is normal. There is mildly elevated pulmonary artery systolic pressure. The estimated right ventricular systolic pressure is 37.8 mmHg. 3. Left atrial size was mildly  dilated. 4. The mitral valve is normal in structure. Mild mitral valve regurgitation. 5. The aortic valve is calcified. Aortic valve regurgitation is not visualized. Mild to moderate aortic valve stenosis. Vmax 2.8 m/s, MG 21 mmHg, AVA 1.6 cm^2, DI 0.45 6. The inferior vena cava is normal in size with greater than 50% respiratory variability, suggesting right atrial pressure of 3 mmHg.  FINDINGS Left Ventricle: Left ventricular ejection fraction, by estimation, is 45 to 50%. The left ventricle has mildly decreased function. The left ventricle demonstrates global hypokinesis. Definity  contrast agent was given IV to delineate the left ventricular endocardial borders. The left ventricular internal cavity size was mildly dilated. There is no left ventricular hypertrophy. Left ventricular diastolic parameters are indeterminate.  Right Ventricle: The right ventricular size is normal. No increase in right ventricular wall thickness. Right ventricular systolic function is normal. There is mildly elevated pulmonary artery systolic pressure. The tricuspid regurgitant velocity is 2.95 m/s, and with an assumed right atrial pressure of 3 mmHg, the estimated right ventricular systolic pressure is 37.8 mmHg.  Left [facility]: Left atrial size was mildly dilated.  Right [facility]: Right atrial size was normal in size.  Pericardium: Trivial pericardial effusion is present.  Mitral Valve: The mitral valve is normal in structure. Mild mitral valve regurgitation.  Tricuspid Valve: The tricuspid valve is normal in structure. Tricuspid valve regurgitation is mild.  Aortic Valve: The aortic valve is calcified. Aortic valve regurgitation is not visualized. Mild to moderate aortic stenosis is present. Aortic valve mean gradient measures 18.6 mmHg. Aortic valve peak gradient measures 27.5 mmHg. Aortic valve area, by VTI measures 1.39 cm.  Pulmonic Valve: The pulmonic valve was not well visualized. Pulmonic valve  regurgitation is not visualized.  Aorta: The aortic root and ascending aorta are structurally normal, with no  evidence of dilitation.  Venous: The inferior vena cava is normal in size with greater than 50% respiratory variability, suggesting right atrial pressure of 3 mmHg.  IAS/Shunts: The atrial septum is grossly normal.   LEFT VENTRICLE PLAX 2D LVIDd:         6.00 cm      Diastology LVIDs:         4.60 cm      LV e' medial:    7.87 cm/s LV PW:         0.90 cm      LV E/e' medial:  14.2 LV IVS:        1.00 cm      LV e' lateral:   8.01 cm/s LVOT diam:     2.20 cm      LV E/e' lateral: 13.9 LV SV:         77 LV SV Index:   32 LVOT Area:     3.80 cm  LV Volumes (MOD) LV vol d, MOD A2C: 122.0 ml LV vol d, MOD A4C: 121.0 ml LV vol s, MOD A2C: 43.7 ml LV vol s, MOD A4C: 76.9 ml LV SV MOD A2C:     78.3 ml LV SV MOD A4C:     121.0 ml LV SV MOD BP:      64.1 ml  RIGHT VENTRICLE RV Basal diam:  3.50 cm RV Mid diam:    3.60 cm RV S prime:     11.20 cm/s  LEFT ATRIUM             Index        RIGHT ATRIUM           Index LA diam:        4.80 cm 2.01 cm/m   RA Area:     19.10 cm LA Vol (A2C):   89.8 ml 37.57 ml/m  RA Volume:   47.80 ml  20.00 ml/m LA Vol (A4C):   87.9 ml 36.77 ml/m LA Biplane Vol: 91.9 ml 38.45 ml/m AORTIC VALVE AV Area (Vmax):    1.52 cm AV Area (Vmean):   1.39 cm AV Area (VTI):     1.39 cm AV Vmax:           262.40 cm/s AV Vmean:          209.000 cm/s AV VTI:            0.554 m AV Peak Grad:      27.5 mmHg AV Mean Grad:      18.6 mmHg LVOT Vmax:         104.92 cm/s LVOT Vmean:        76.200 cm/s LVOT VTI:          0.202 m LVOT/AV VTI ratio: 0.36  AORTA Ao Asc diam: 3.40 cm  MITRAL VALVE                TRICUSPID VALVE MV Area (PHT): 4.70 cm     TR Peak grad:   34.8 mmHg MV Decel Time: 161 msec     TR Vmax:        295.00 cm/s MV E velocity: 111.67 cm/s SHUNTS Systemic VTI:  0.20 m Systemic Diam: 2.20 cm  Lonni Nanas  MD Electronically signed by Lonni Nanas MD Signature Date/Time: 02/26/2024/2:34:07 PM    Final (Updated)        CARDIAC MRI  MR CARDIAC MORPHOLOGY W WO CONTRAST 02/29/2024  Narrative CLINICAL DATA:  Clinical  question of scar formation Study assumes HCT of 32 and BSA of 2.49 m2.  EXAM: CARDIAC MRI  TECHNIQUE: The patient was scanned on a 1.5 Tesla GE magnet. A dedicated cardiac coil was used. Functional imaging was done using Fiesta sequences. 2,3, and 4 chamber views were done to assess for RWMA's. Modified Simpson's rule using was used to calculate an ejection fraction on a dedicated work [employer]. The patient received 10 cc of Gadavist . After 10 minutes inversion recovery sequences were used to assess for infiltration and scar tissue. Flow quantification was performed 2 times during this examination with flow quantification performed at the levels of the ascending aorta above the valve, pulmonary artery above the valve.  CONTRAST:  10 cc  of Gadavist   FINDINGS: 1. Normal left ventricular size, with LVEDD 55 mm, and LVEDVi 59 mL/m2.  Normal left ventricular thickness.  Moderate decrease ventricular systolic function (LVEF =35%). Patient is free breathing and in a irregular rapid rhythm. Grossly suggestive of global hypokinesis. Unable to perform accurate strain.  Left ventricular parametric mapping notable for elevated native T1 in the basal lateral (1372 ms), poor post contrast T1 comparison for ECV, and increased T2 in the basal lateral (59 ms).  There is late gadolinium enhancement in the left ventricular myocardium, that is subendocardial, in the inferior lateral wall. This accounts for 75% of the wall.  Cannot exclude abnormal perfusion in the mid inferolateral.  2. Normal right ventricular size with RVEDVI 53 mL/m2.  Normal right ventricular thickness.  Moderate decrease in the right ventricular systolic function  (RVEF =31%). There are no regional wall motion abnormalities or aneurysms but global hypokinesis.  3.  Normal right atrial size with left atrial enlargement.  4. Normal size of the aortic root, ascending aorta. Moderate dilation of the main pulmonary artery, 31 mm.  5. Valve assessment:  Aortic Valve: Tri-leaflet and calcified valve. Mean gradient 4 mm Hg using anti-aliasing. Regurgitant fraction of 21% may overestimate regurgitation in the setting of RVR acquisition.  Pulmonic Valve: Mild regurgitation, regurgitant fraction of 8% may overestimate regurgitation in the setting of RVR acquisition.  Tricuspid Valve: Sub-optimal view of valve morphology. Qualitatively mild, central regurgitation.  Mitral Valve: Mild central regurgitation. Regurgitant fraction of 13%.  6. Normal pericardium. Small basal lateral pericardial effusion without evidence of tamponade physiology.  7. Grossly, bilateral pleural effusion noted but no extracardiac findings. Recommended dedicated study if concerned for non-cardiac pathology.  8. Difficult acquisition in the setting of free breathing and AF RVR.  IMPRESSION: 1. LVEF 35% in the setting of AF RVR. 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 In correlation with lab testing showing no acute infarction this admission, this is less likely a viable territory.  2.  RVEF 31% in the setting of AF RVR.  3.  Bilateral pleural effusions incidentally noted.  Stanly Leavens MD   Electronically Signed By: Stanly Leavens M.D. On: 02/29/2024 15:57   ______________________________________________________________________________________________        Assessment & Plan    AF RVR - s/p 02/26/24 DCCV - AC and anti-platelet as per EP - Consented for TEE/DCCV on Friday, I have reached out to EP and IM because it appears that they have expedited this for today instead.  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  Peri-discharge, please send a copy of his DC note to Dr. Debora  For questions or updates, please contact [facility] Please consult [email or web address] for contact info under Cardiology/STEMI.      Stanly Leavens, MD FASE [facility] Cardiologist [facility]  [address] [place], [address] [place], [place] [address] [phone]  9:03 AM

## 2024-03-03 NOTE — Plan of Care (Signed)
  Problem: Education: Goal: Knowledge of General Education information will improve Description: Including pain rating scale, medication(s)/side effects and non-pharmacologic comfort measures Outcome: Progressing   Problem: Health Behavior/Discharge Planning: Goal: Ability to manage health-related needs will improve Outcome: Progressing   Problem: Clinical Measurements: Goal: Ability to maintain clinical measurements within normal limits will improve Outcome: Progressing Goal: Will remain free from infection Outcome: Progressing Goal: Diagnostic test results will improve Outcome: Progressing Goal: Respiratory complications will improve Outcome: Progressing Goal: Cardiovascular complication will be avoided Outcome: Progressing   Problem: Activity: Goal: Risk for activity intolerance will decrease Outcome: Progressing   Problem: Nutrition: Goal: Adequate nutrition will be maintained Outcome: Progressing   Problem: Coping: Goal: Level of anxiety will decrease Outcome: Progressing   Problem: Elimination: Goal: Will not experience complications related to bowel motility Outcome: Progressing Goal: Will not experience complications related to urinary retention Outcome: Progressing   Problem: Pain Managment: Goal: General experience of comfort will improve and/or be controlled Outcome: Progressing   Problem: Safety: Goal: Ability to remain free from injury will improve Outcome: Progressing   Problem: Skin Integrity: Goal: Risk for impaired skin integrity will decrease Outcome: Progressing   Problem: Education: Goal: Knowledge of disease or condition will improve Outcome: Progressing Goal: Understanding of medication regimen will improve Outcome: Progressing Goal: Individualized Educational Video(s) Outcome: Progressing   Problem: Activity: Goal: Ability to tolerate increased activity will improve Outcome: Progressing   Problem: Cardiac: Goal: Ability to achieve  and maintain adequate cardiopulmonary perfusion will improve Outcome: Progressing   Problem: Health Behavior/Discharge Planning: Goal: Ability to safely manage health-related needs after discharge will improve Outcome: Progressing   Problem: Education: Goal: Ability to describe self-care measures that may prevent or decrease complications (Diabetes Survival Skills Education) will improve Outcome: Progressing Goal: Individualized Educational Video(s) Outcome: Progressing   Problem: Coping: Goal: Ability to adjust to condition or change in health will improve Outcome: Progressing   Problem: Fluid Volume: Goal: Ability to maintain a balanced intake and output will improve Outcome: Progressing   Problem: Health Behavior/Discharge Planning: Goal: Ability to identify and utilize available resources and services will improve Outcome: Progressing Goal: Ability to manage health-related needs will improve Outcome: Progressing   Problem: Metabolic: Goal: Ability to maintain appropriate glucose levels will improve Outcome: Progressing   Problem: Skin Integrity: Goal: Risk for impaired skin integrity will decrease Outcome: Progressing   Problem: Tissue Perfusion: Goal: Adequacy of tissue perfusion will improve Outcome: Progressing

## 2024-03-03 NOTE — Progress Notes (Signed)
 RT placed pt on CPAP at this time.   03/03/24 2222  BiPAP/CPAP/SIPAP  BiPAP/CPAP/SIPAP Pt Type Adult  BiPAP/CPAP/SIPAP Resmed  Mask Type Full face mask  Dentures removed? Not applicable  Mask Size Large  Respiratory Rate 18 breaths/min  EPAP 10 cmH2O  FiO2 (%) 32 %  Flow Rate 3 lpm  Minute Ventilation 21  Leak 12  Peak Inspiratory Pressure (PIP) 15  Tidal Volume (Vt) 502  Patient Home Machine No  Patient Home Mask No  Patient Home Tubing No  Auto Titrate No  Minimum cmH2O 5 cmH2O  Maximum cmH2O 10 cmH2O  Nasal massage performed Yes  CPAP/SIPAP surface wiped down Yes  Device Plugged into RED Power Outlet Yes

## 2024-03-03 NOTE — Plan of Care (Signed)
 Problem: Education: Goal: Knowledge of General Education information will improve Description: Including pain rating scale, medication(s)/side effects and non-pharmacologic comfort measures Outcome: Progressing   Problem: Health Behavior/Discharge Planning: Goal: Ability to manage health-related needs will improve Outcome: Progressing   Problem: Clinical Measurements: Goal: Ability to maintain clinical measurements within normal limits will improve Outcome: Progressing Goal: Will remain free from infection Outcome: Progressing Goal: Diagnostic test results will improve Outcome: Progressing Goal: Respiratory complications will improve Outcome: Progressing Goal: Cardiovascular complication will be avoided Outcome: Progressing   Problem: Activity: Goal: Risk for activity intolerance will decrease Outcome: Progressing   Problem: Nutrition: Goal: Adequate nutrition will be maintained Outcome: Progressing   Problem: Coping: Goal: Level of anxiety will decrease Outcome: Progressing   Problem: Elimination: Goal: Will not experience complications related to bowel motility Outcome: Progressing Goal: Will not experience complications related to urinary retention Outcome: Progressing   Problem: Pain Managment: Goal: General experience of comfort will improve and/or be controlled Outcome: Progressing   Problem: Safety: Goal: Ability to remain free from injury will improve Outcome: Progressing   Problem: Skin Integrity: Goal: Risk for impaired skin integrity will decrease Outcome: Progressing   Problem: Education: Goal: Knowledge of disease or condition will improve Outcome: Progressing Goal: Understanding of medication regimen will improve Outcome: Progressing Goal: Individualized Educational Video(s) Outcome: Progressing   Problem: Activity: Goal: Ability to tolerate increased activity will improve Outcome: Progressing   Problem: Cardiac: Goal: Ability to achieve  and maintain adequate cardiopulmonary perfusion will improve Outcome: Progressing   Problem: Health Behavior/Discharge Planning: Goal: Ability to safely manage health-related needs after discharge will improve Outcome: Progressing   Problem: Education: Goal: Ability to describe self-care measures that may prevent or decrease complications (Diabetes Survival Skills Education) will improve Outcome: Progressing Goal: Individualized Educational Video(s) Outcome: Progressing   Problem: Coping: Goal: Ability to adjust to condition or change in health will improve Outcome: Progressing   Problem: Fluid Volume: Goal: Ability to maintain a balanced intake and output will improve Outcome: Progressing   Problem: Health Behavior/Discharge Planning: Goal: Ability to identify and utilize available resources and services will improve Outcome: Progressing Goal: Ability to manage health-related needs will improve Outcome: Progressing   Problem: Metabolic: Goal: Ability to maintain appropriate glucose levels will improve Outcome: Progressing   Problem: Nutritional: Goal: Maintenance of adequate nutrition will improve Outcome: Progressing Goal: Progress toward achieving an optimal weight will improve Outcome: Progressing   Problem: Skin Integrity: Goal: Risk for impaired skin integrity will decrease Outcome: Progressing   Problem: Tissue Perfusion: Goal: Adequacy of tissue perfusion will improve Outcome: Progressing   Problem: Education: Goal: Ability to describe self-care measures that may prevent or decrease complications (Diabetes Survival Skills Education) will improve Outcome: Progressing Goal: Individualized Educational Video(s) Outcome: Progressing   Problem: Coping: Goal: Ability to adjust to condition or change in health will improve Outcome: Progressing   Problem: Fluid Volume: Goal: Ability to maintain a balanced intake and output will improve Outcome: Progressing    Problem: Health Behavior/Discharge Planning: Goal: Ability to identify and utilize available resources and services will improve Outcome: Progressing Goal: Ability to manage health-related needs will improve Outcome: Progressing   Problem: Metabolic: Goal: Ability to maintain appropriate glucose levels will improve Outcome: Progressing   Problem: Nutritional: Goal: Maintenance of adequate nutrition will improve Outcome: Progressing Goal: Progress toward achieving an optimal weight will improve Outcome: Progressing   Problem: Skin Integrity: Goal: Risk for impaired skin integrity will decrease Outcome: Progressing   Problem: Tissue Perfusion: Goal: Adequacy  of tissue perfusion will improve Outcome: Progressing   Problem: Education: Goal: Knowledge of cardiac device and self-care will improve Outcome: Progressing Goal: Ability to safely manage health related needs after discharge will improve Outcome: Progressing Goal: Individualized Educational Video(s) Outcome: Progressing

## 2024-03-03 NOTE — Evaluation (Signed)
 Physical Therapy Evaluation Patient Details Name: Mike Hunter MRN: [id number] DOB: [birth year] Today's Date: 03/03/2024  History of Present Illness  Pt is a 73 y/o male admitted 9/11 with 1 week of palpitations from afib with RVR.  PMHx:  Afib, bipolar d/o, CHF, DM, HTN, reflux, ICD implantation  Clinical Impression  Pt admitted with/for Afib with RVR, s/p Pacer/ICD implantation.  This pt needed mod to CGA for bed mobility progressing to standing.  Pt was a little apprehensive about moving away from the bed today, but should do well enough if his HR cooperates.  Pt currently limited functionally due to the problems listed below.  (see problems list.)  Pt will benefit from PT to maximize function and safety to be able to get home safely with available assist .         If plan is discharge home, recommend the following: A little help with bathing/dressing/bathroom;Assistance with cooking/housework;Assist for transportation;Help with stairs or ramp for entrance   Can travel by private vehicle        Equipment Recommendations None recommended by PT  Recommendations for Other Services       Functional Status Assessment Patient has had a recent decline in their functional status and demonstrates the ability to make significant improvements in function in a reasonable and predictable amount of time.     Precautions / Restrictions Precautions Precautions: Fall Recall of Precautions/Restrictions: Intact      Mobility  Bed Mobility Overal bed mobility: Needs Assistance Bed Mobility: Rolling, Sidelying to Sit Rolling: Min assist Sidelying to sit: Mod assist       General bed mobility comments: assist    Transfers Overall transfer level: Needs assistance   Transfers: Sit to/from Stand Sit to Stand: Contact guard assist           General transfer comment: cues for hand placement, STS x4    Ambulation/Gait               General Gait Details: deferred by  pt.  Stairs            Wheelchair Mobility     Tilt Bed    Modified Rankin (Stroke Patients Only)       Balance Overall balance assessment: Needs assistance Sitting-balance support: No upper extremity supported, Feet supported Sitting balance-Leahy Scale: Good     Standing balance support: No upper extremity supported, Single extremity supported, During functional activity Standing balance-Leahy Scale: Fair                               Pertinent Vitals/Pain Pain Assessment Pain Assessment: Faces Faces Pain Scale: Hurts little more Pain Location: L shoulder Pain Descriptors / Indicators: Sore Pain Intervention(s): Monitored during session    Home Living Family/patient expects to be discharged to:: Private residence Living Arrangements: Spouse/significant other Available Help at Discharge: Family;Available 24 hours/day Type of Home: House Home Access: Stairs to enter Entrance Stairs-Rails: Right Entrance Stairs-Number of Steps: 3   Home Layout: One level Home Equipment: Agricultural consultant (2 wheels);Shower seat;Grab bars - tub/shower;Grab bars - toilet;Other (comment)      Prior Function Prior Level of Function : Independent/Modified Independent;Driving;History of Falls (last six months)             Mobility Comments: At baseline, pt is Mod I for functional mobility with a walking stick. Pt also reports use of a RW fo a little while after sustaining compression fxs  in January 2025. Pt reports at least two falls in last 6 months, including fall leading to compression fxs. ADLs Comments: Pt is largely Ind to Mod I for ADLs, light meal prep, and manages his medication. Pt drives.     Extremity/Trunk Assessment        Lower Extremity Assessment Lower Extremity Assessment: Overall WFL for tasks assessed;Generalized weakness    Cervical / Trunk Assessment Cervical / Trunk Assessment: Normal  Communication   Communication Communication:  Impaired Factors Affecting Communication: Hearing impaired    Cognition Arousal: Alert Behavior During Therapy: WFL for tasks assessed/performed   PT - Cognitive impairments: No apparent impairments                         Following commands: Intact       Cueing Cueing Techniques: Verbal cues     General Comments General comments (skin integrity, edema, etc.): afib with tach fluctuating up to and beyond 125 bpm..  Pt tolerated eval well, though limited to standing at bedside.  education on pacer precautions.    Exercises     Assessment/Plan    PT Assessment Patient needs continued PT services  PT Problem List Decreased activity tolerance;Decreased mobility;Decreased knowledge of use of DME;Pain;Decreased strength;Cardiopulmonary status limiting activity       PT Treatment Interventions DME instruction;Gait training;Functional mobility training;Therapeutic activities;Balance training;Patient/family education    PT Goals (Current goals can be found in the Care Plan section)  Acute Rehab PT Goals Patient Stated Goal: able to help out my wife soon.  Independent PT Goal Formulation: With patient Potential to Achieve Goals: Good    Frequency Min 3X/week     Co-evaluation               AM-PAC PT 6 Clicks Mobility  Outcome Measure Help needed turning from your back to your side while in a flat bed without using bedrails?: A Little Help needed moving from lying on your back to sitting on the side of a flat bed without using bedrails?: A Lot Help needed moving to and from a bed to a chair (including a wheelchair)?: A Little Help needed standing up from a chair using your arms (e.g., wheelchair or bedside chair)?: A Little Help needed to walk in hospital room?: A Little Help needed climbing 3-5 steps with a railing? : A Little 6 Click Score: 17    End of Session   Activity Tolerance: Patient tolerated treatment well Patient left: in bed;with call  bell/phone within reach Nurse Communication: Mobility status PT Visit Diagnosis: Other abnormalities of gait and mobility (R26.89);Pain Pain - Right/Left: Left Pain - part of body: Shoulder (pacer site)    Time: 8254-8178 PT Time Calculation (min) (ACUTE ONLY): 36 min   Charges:   PT Evaluation $PT Eval Moderate Complexity: 1 Mod PT Treatments $Therapeutic Activity: 8-22 mins PT General Charges $$ ACUTE PT VISIT: 1 Visit         03/03/2024  [place] HERO., PT Acute Rehabilitation Services [phone]  (office)  Vinie GAILS Jakari Sada 03/03/2024, 8:20 PM

## 2024-03-03 NOTE — Progress Notes (Signed)
 Progress Note   Patient: Mike Hunter FMW:[id number] DOB: 10-07-1950 DOA: 02/25/2024     6 DOS: the patient was seen and examined on 03/03/2024   Brief hospital course: 73 year old male with paroxysmal atrial fibrillation on Xarelto , chronic HFpEF, COPD, CKD stage IIIb diabetes mellitus type 2, HTN, osteoporosis, CAD status post PCI, obesity, OSA on CPAP presented to ED with palpitations for 1 week.  He also reported chest pain, pressure with radiation down the left arm, associated shortness of breath, prior to presenting to ED, heart rate was in 130s In ED, patient was found to have heart rate 131, BP 105/53 and was placed on Cardizem  drip cardiology was consulted.  Assessment and Plan: Principal Problem:   Atrial fibrillation with rapid ventricular response (HCC) - Initially placed on Cardizem  drip  - Torsemide , Aldactone , metoprolol , Imdur  were held due to borderline BP - 2D echo showed EF of 45 to 50%, global hypokinesis, diastolic parameters indeterminate -  DCCV on 9/12 but had a reccurrence of AF postprocedure on 9/13, started on amiodarone  drip by cardiology - EP cardiology consulted, continued amiodarone  - discussed with Cardiology, now plan for DCCV tomorrow, NPO after midnight tonight   Wide-complex tachycardia, hemodynamically unstable, chest pain - On 9/13, patient had sudden onset of V. tach, rate 180s to 190s, seen by cardiology urgently and was given IV amiodarone  bolus x2, placed on amiodarone  drip - EP cardiology following, now s/p ICD on 9/16   Acute on chronic systolic and diastolic heart failure with volume overload - Noted to be in volume overload on 9/13, torsemide  and Aldactone  had been held on the admission due to borderline BP - Was on IV lasix , now on torsemide  BID per Cardiology   CAD status post PCI,  hypertension -Previous echo 12/2023 had shown EF of 55 to 60%, indeterminate left ventricular diastolic parameters, global hypokinesis, moderate asymmetric LVH of  the septal segment.  Mild to moderate aortic stenosis - Continue Plavix , cardiology following     COPD with mild exacerbation Leukocytosis, acute bronchitis -Improving, continue Pulmicort , Brovana , Xopenex  nebs -Troponin 0.71 -Placed on IV Unasyn , blood cultures NTD, leukocytosis resolved  -Anticipate completing abx 9/20       Insulin -requiring or dependent type II diabetes mellitus (HCC), uncontrolled with hyperglycemia  -On high doses of insulin  outpatient, last A1c 6.7   CBG (last 3)  Recent Labs (last 2 labs)       Recent Labs    02/29/24 1602 02/29/24 2132 03/01/24 0606  GLUCAP 272* 227* 202*      -CBG still uncontrolled, increased Lantus  to 40 units daily, NovoLog  8 units 3 times daily AC, moderate SSI -Diabetic coordinator consult     Anemia due to stage 3b chronic kidney disease (HCC) -H&H currently stable.  Baseline hemoglobin 9-10   Mild acute on CKD stage IIIb - Baseline creatinine 1.4-1.7 likely worsened due to A-fib with RVR, now on diuresis  - Creatinine improving   Hyperlipidemia - Resume statin     Obesity class II Estimated body mass index is 39.48 kg/m as calculated from the following:   Height as of this encounter: 5' 10 (1.778 m).   Weight as of this encounter: 124.8 kg.         Subjective: No complaints today  Physical Exam: Vitals:   03/03/24 0737 03/03/24 0945 03/03/24 1130 03/03/24 1540  BP:  92/60 100/69 (!) 99/56  Pulse:   85 (!) 120  Resp:  19 15 17   Temp:   98.1 F (  36.7 C) 98.2 F (36.8 C)  TempSrc:   Oral Oral  SpO2: 94%  97% 97%  Weight:      Height:       General exam: Conversant, in no acute distress Respiratory system: normal chest rise, clear, no audible wheezing Cardiovascular system: regular rhythm, s1-s2 Gastrointestinal system: Nondistended, nontender, pos BS Central nervous system: No seizures, no tremors Extremities: No cyanosis, no joint deformities Skin: No rashes, no pallor Psychiatry: Affect normal  // no auditory hallucinations   Data Reviewed:  Labs reviewed: Na 139, K 3.6, Cr 1.74, WBC 10.3, Hgb 10.0, Plts 318  Family Communication: Pt in room, family not at bedside  Disposition: Status is: Inpatient Remains inpatient appropriate because: severity of illness  Planned Discharge Destination: Home     Author: Garnette Pelt, MD 03/03/2024 4:26 PM  For on call review [email or web address].

## 2024-03-04 ENCOUNTER — Inpatient Hospital Stay (HOSPITAL_COMMUNITY): Admitting: Anesthesiology

## 2024-03-04 ENCOUNTER — Inpatient Hospital Stay (HOSPITAL_COMMUNITY)
Admit: 2024-03-04 | Discharge: 2024-03-04 | Disposition: A | Discharge status: Home / Self Care | Attending: Cardiovascular Disease | Admitting: Cardiovascular Disease

## 2024-03-04 ENCOUNTER — Encounter (HOSPITAL_COMMUNITY)
Admission: EM | Disposition: A | Discharge status: Home / Self Care | Payer: Self-pay | Source: Home / Self Care | Attending: Internal Medicine

## 2024-03-04 DIAGNOSIS — I4891 Unspecified atrial fibrillation: Secondary | ICD-10-CM | POA: Diagnosis not present

## 2024-03-04 DIAGNOSIS — I5020 Unspecified systolic (congestive) heart failure: Secondary | ICD-10-CM | POA: Diagnosis not present

## 2024-03-04 DIAGNOSIS — N1832 Chronic kidney disease, stage 3b: Secondary | ICD-10-CM | POA: Diagnosis not present

## 2024-03-04 DIAGNOSIS — I5033 Acute on chronic diastolic (congestive) heart failure: Secondary | ICD-10-CM

## 2024-03-04 DIAGNOSIS — R079 Chest pain, unspecified: Secondary | ICD-10-CM | POA: Diagnosis not present

## 2024-03-04 DIAGNOSIS — I130 Hypertensive heart and chronic kidney disease with heart failure and stage 1 through stage 4 chronic kidney disease, or unspecified chronic kidney disease: Secondary | ICD-10-CM | POA: Diagnosis not present

## 2024-03-04 DIAGNOSIS — I2510 Atherosclerotic heart disease of native coronary artery without angina pectoris: Secondary | ICD-10-CM | POA: Diagnosis not present

## 2024-03-04 DIAGNOSIS — I4892 Unspecified atrial flutter: Secondary | ICD-10-CM | POA: Diagnosis not present

## 2024-03-04 HISTORY — PX: TRANSESOPHAGEAL ECHOCARDIOGRAM (CATH LAB): EP1270

## 2024-03-04 HISTORY — PX: CARDIOVERSION: EP1203

## 2024-03-04 LAB — COMPREHENSIVE METABOLIC PANEL WITH GFR
ALT: 19 U/L (ref 0–44)
AST: 14 U/L — ABNORMAL LOW (ref 15–41)
Albumin: 2.7 g/dL — ABNORMAL LOW (ref 3.5–5.0)
Alkaline Phosphatase: 86 U/L (ref 38–126)
Anion gap: 18 — ABNORMAL HIGH (ref 5–15)
BUN: 33 mg/dL — ABNORMAL HIGH (ref 8–23)
CO2: 23 mmol/L (ref 22–32)
Calcium: 8.7 mg/dL — ABNORMAL LOW (ref 8.9–10.3)
Chloride: 99 mmol/L (ref 98–111)
Creatinine, Ser: 1.49 mg/dL — ABNORMAL HIGH (ref 0.61–1.24)
GFR, Estimated: 49 mL/min — ABNORMAL LOW (ref >60)
Glucose, Bld: 318 mg/dL — ABNORMAL HIGH (ref 70–99)
Potassium: 3.5 mmol/L (ref 3.5–5.1)
Sodium: 140 mmol/L (ref 135–145)
Total Bilirubin: 0.6 mg/dL (ref 0.0–1.2)
Total Protein: 6.7 g/dL (ref 6.5–8.1)

## 2024-03-04 LAB — CBC
HCT: 34.7 % — ABNORMAL LOW (ref 39.0–52.0)
Hemoglobin: 10.2 g/dL — ABNORMAL LOW (ref 13.0–17.0)
MCH: 21.7 pg — ABNORMAL LOW (ref 26.0–34.0)
MCHC: 29.4 g/dL — ABNORMAL LOW (ref 30.0–36.0)
MCV: 74 fL — ABNORMAL LOW (ref 80.0–100.0)
Platelets: 319 K/uL (ref 150–400)
RBC: 4.69 MIL/uL (ref 4.22–5.81)
RDW: 17.5 % — ABNORMAL HIGH (ref 11.5–15.5)
WBC: 9.8 K/uL (ref 4.0–10.5)
nRBC: 0 % (ref 0.0–0.2)

## 2024-03-04 LAB — CULTURE, BLOOD (ROUTINE X 2)
Culture: NO GROWTH
Culture: NO GROWTH
Special Requests: ADEQUATE

## 2024-03-04 LAB — GLUCOSE, CAPILLARY
Glucose-Capillary: 304 mg/dL — ABNORMAL HIGH (ref 70–99)
Glucose-Capillary: 150 mg/dL — ABNORMAL HIGH (ref 70–99)
Glucose-Capillary: 150 mg/dL — ABNORMAL HIGH (ref 70–99)
Glucose-Capillary: 179 mg/dL — ABNORMAL HIGH (ref 70–99)

## 2024-03-04 LAB — ECHO TEE

## 2024-03-04 SURGERY — TRANSESOPHAGEAL ECHOCARDIOGRAM (TEE) (CATHLAB)
Anesthesia: Monitor Anesthesia Care

## 2024-03-04 MED ORDER — LIDOCAINE HCL 4 % EX SOLN
CUTANEOUS | Status: DC | PRN
Start: 2024-03-04 — End: 2024-03-04
  Administered 2024-03-04: 2 mL via TOPICAL

## 2024-03-04 MED ORDER — PHENYLEPHRINE 80 MCG/ML (10ML) SYRINGE FOR IV PUSH (FOR BLOOD PRESSURE SUPPORT)
PREFILLED_SYRINGE | INTRAVENOUS | Status: DC | PRN
  Administered 2024-03-04 (×2): 160 ug via INTRAVENOUS

## 2024-03-04 MED ORDER — PROPOFOL 500 MG/50ML IV EMUL
INTRAVENOUS | Status: DC | PRN
  Administered 2024-03-04: 120 ug/kg/min via INTRAVENOUS

## 2024-03-04 MED ORDER — LOSARTAN POTASSIUM 25 MG PO TABS
25.0000 mg | ORAL_TABLET | Freq: Every day | ORAL | Status: DC
Start: 2024-03-04 — End: 2024-03-09
  Administered 2024-03-04 – 2024-03-09 (×6): 25 mg via ORAL
  Filled 2024-03-04 (×6): qty 1

## 2024-03-04 MED ORDER — LIDOCAINE 2% (20 MG/ML) 5 ML SYRINGE: INTRAMUSCULAR | Status: DC | PRN

## 2024-03-04 MED ORDER — LIDOCAINE 2% (20 MG/ML) 5 ML SYRINGE
INTRAMUSCULAR | Status: DC | PRN
  Administered 2024-03-04: 50 mg via INTRAVENOUS

## 2024-03-04 MED ORDER — PROPOFOL 10 MG/ML IV BOLUS
INTRAVENOUS | Status: DC | PRN
Start: 2024-03-04 — End: 2024-03-04
  Administered 2024-03-04: 40 mg via INTRAVENOUS

## 2024-03-04 SURGICAL SUPPLY — 1 items: PAD DEFIB RADIO PHYSIO CONN (PAD) ×1 IMPLANT

## 2024-03-04 NOTE — Progress Notes (Signed)
 Progress Note   Patient: Mike Hunter FMW:[id number] DOB: 1950/07/05 DOA: 02/25/2024     7 DOS: the patient was seen and examined on 03/04/2024   Brief hospital course: 73 year old male with paroxysmal atrial fibrillation on Xarelto , chronic HFpEF, COPD, CKD stage IIIb diabetes mellitus type 2, HTN, osteoporosis, CAD status post PCI, obesity, OSA on CPAP presented to ED with palpitations for 1 week.  He also reported chest pain, pressure with radiation down the left arm, associated shortness of breath, prior to presenting to ED, heart rate was in 130s In ED, patient was found to have heart rate 131, BP 105/53 and was placed on Cardizem  drip cardiology was consulted.  Assessment and Plan: Principal Problem:   Atrial fibrillation with rapid ventricular response (HCC) - Initially placed on Cardizem  drip  - Torsemide , Aldactone , metoprolol , Imdur  were held due to borderline BP - 2D echo showed EF of 45 to 50%, global hypokinesis, diastolic parameters indeterminate -  DCCV on 9/12 but had a reccurrence of AF postprocedure on 9/13, started on amiodarone  drip by cardiology - EP cardiology consulted, continued amiodarone  - Pt now failed attempt at DCCV 9/19   Wide-complex tachycardia, hemodynamically unstable, chest pain - On 9/13, patient had sudden onset of V. tach, rate 180s to 190s, seen by cardiology urgently and was given IV amiodarone  bolus x2, placed on amiodarone  drip - EP cardiology following, now s/p ICD on 9/16   Acute on chronic systolic and diastolic heart failure with volume overload - Noted to be in volume overload on 9/13, torsemide  and Aldactone  had been held on the admission due to borderline BP - Was on IV lasix , now on torsemide  BID per Cardiology   CAD status post PCI,  hypertension -Previous echo 12/2023 had shown EF of 55 to 60%, indeterminate left ventricular diastolic parameters, global hypokinesis, moderate asymmetric LVH of the septal segment.  Mild to moderate aortic  stenosis - Continue Plavix , cardiology following     COPD with mild exacerbation Leukocytosis, acute bronchitis -Improving, continue Pulmicort , Brovana , Xopenex  nebs -Troponin 0.71 -Placed on IV Unasyn , blood cultures NTD, leukocytosis resolved  -Anticipate completing abx 9/20       Insulin -requiring or dependent type II diabetes mellitus (HCC), uncontrolled with hyperglycemia  -On high doses of insulin  outpatient, last A1c 6.7   CBG (last 3)  Recent Labs (last 2 labs)       Recent Labs    02/29/24 1602 02/29/24 2132 03/01/24 0606  GLUCAP 272* 227* 202*      -CBG still uncontrolled, increased Lantus  to 40 units daily, NovoLog  8 units 3 times daily AC, moderate SSI -Diabetic coordinator consult     Anemia due to stage 3b chronic kidney disease (HCC) -H&H currently stable.  Baseline hemoglobin 9-10   Mild acute on CKD stage IIIb - Baseline creatinine 1.4-1.7 likely worsened due to A-fib with RVR, now on diuresis  - Creatinine stable at 1.49   Hyperlipidemia - Resume statin    Obesity class II Estimated body mass index is 39.48 kg/m as calculated from the following:   Height as of this encounter: 5' 10 (1.778 m).   Weight as of this encounter: 124.8 kg.       Subjective: No complaints today  Physical Exam: Vitals:   03/04/24 1405 03/04/24 1415 03/04/24 1425 03/04/24 1435  BP: (!) 81/64 91/66 102/75 99/63  Pulse: 96 83 100 86  Resp: 19 15 16  (!) 4  Temp:      TempSrc:  SpO2: 100% 100% 100% 100%  Weight:      Height:       General exam: Awake, laying in bed, in nad Respiratory system: Normal respiratory effort, no wheezing Cardiovascular system: regular rate, s1, s2 Gastrointestinal system: Soft, nondistended, positive BS Central nervous system: CN2-12 grossly intact, strength intact Extremities: Perfused, no clubbing Skin: Normal skin turgor, no notable skin lesions seen Psychiatry: Mood normal // affect seems normal  Data Reviewed:  Labs  reviewed: Na 140, K 3.5, Cr 1.49, WBC 9.8, Hgb 10.2, Plts 319  Family Communication: Pt in room, family not at bedside  Disposition: Status is: Inpatient Remains inpatient appropriate because: severity of illness  Planned Discharge Destination: Home     Author: Garnette Pelt, MD 03/04/2024 4:31 PM  For on call review [email or web address].

## 2024-03-04 NOTE — Inpatient Diabetes Management (Signed)
 Inpatient Diabetes Program Recommendations  AACE/ADA: New Consensus Statement on Inpatient Glycemic Control (2015)  Target Ranges:  Prepandial:   less than 140 mg/dL      Peak postprandial:   less than 180 mg/dL (1-2 hours)      Critically ill patients:  140 - 180 mg/dL   Lab Results  Component Value Date   GLUCAP 304 (H) 03/04/2024   HGBA1C 6.7 (H) 01/04/2024    Review of Glycemic Control  Latest Reference Range & Units 03/03/24 06:01 03/03/24 11:32 03/03/24 15:42 03/03/24 20:53 03/04/24 05:59  Glucose-Capillary 70 - 99 mg/dL 800 (H) 774 (H) 713 (H) 196 (H) 304 (H)   Diabetes history: DM 2 Outpatient Diabetes medications:  Tresiba  40 units in the AM and Tresiba  46 units in the PM Current orders for Inpatient glycemic control:  Novolog  0-15 units tid with meals and HS Lantus  40 units q PM Novolog  8 units tid with meals   Inpatient Diabetes Program Recommendations:    Consider increasing Novolog  to 10 units tid with meals. Also consider increasing Lantus  to 50 units q HS.   Thanks,  Randall Bullocks, RN, BC-ADM Inpatient Diabetes Coordinator Pager [phone]  (8a-5p)

## 2024-03-04 NOTE — Progress Notes (Signed)
 Progress Note  Patient Name: Mike Hunter Date of Encounter: 03/04/2024 Primary Cardiologist: New to Dr. Santo   Subjective   Continues to diurese with improves symptoms.  Appears to be in AFL today  Vital Signs    Vitals:   03/04/24 0500 03/04/24 0504 03/04/24 0600 03/04/24 0745  BP:  111/66  120/88  Pulse: 100 (!) 35  87  Resp: 15 18 19 13   Temp:  97.8 F (36.6 C)  97.6 F (36.4 C)  TempSrc:  Oral  Oral  SpO2: 100% 100%  100%  Weight:      Height:        Intake/Output Summary (Last 24 hours) at 03/04/2024 0819 Last data filed at 03/04/2024 0704 Gross per 24 hour  Intake 1328.01 ml  Output 2700 ml  Net -1371.99 ml   Filed Weights   02/28/24 0430 02/29/24 0447 03/01/24 0023  Weight: 124.7 kg 125.2 kg 124.8 kg    Physical Exam   GEN: No acute distress.   Neck: No JVD  Cardiac: IRIR rate controlled no rubs, or gallops. Systolic murmur Respiratory: CTAB GI: Soft, nontender, distended with fluid wave improved MS: No edema  Labs   Telemetry: AF RVR-> AFL rate controlled with V pacing   Chemistry Recent Labs  Lab 02/27/24 1437 02/28/24 0340 03/02/24 0238 03/03/24 0240 03/04/24 0235  NA 136   < > 137 139 140  K 3.8   < > 3.9 3.6 3.5  CL 104   < > 105 100 99  CO2 20*   < > 23 22 23   GLUCOSE 327*   < > 265* 196* 318*  BUN 20   < > 29* 31* 33*  CREATININE 1.65*   < > 1.53* 1.74* 1.49*  CALCIUM  8.3*   < > 8.5* 8.6* 8.7*  PROT 6.7  --   --  6.7 6.7  ALBUMIN  2.9*   < > 2.6* 2.7* 2.7*  AST 17  --   --  16 14*  ALT 19  --   --  17 19  ALKPHOS 80  --   --  79 86  BILITOT 1.2  --   --  0.6 0.6  GFRNONAA 43*   < > 47* 40* 49*  ANIONGAP 12   < > 9 17* 18*   < > = values in this interval not displayed.     Hematology Recent Labs  Lab 03/02/24 0238 03/03/24 0240 03/04/24 0235  WBC 10.2 10.3 9.8  RBC 4.47 4.54 4.69  HGB 9.7* 10.0* 10.2*  HCT 33.2* 33.4* 34.7*  MCV 74.3* 73.6* 74.0*  MCH 21.7* 22.0* 21.7*  MCHC 29.2* 29.9* 29.4*  RDW  17.6* 17.6* 17.5*  PLT 330 318 319   BNP Recent Labs  Lab 02/27/24 1103  BNP 222.0*     Cardiac Studies   Cardiac Studies & Procedures   ______________________________________________________________________________________________     ECHOCARDIOGRAM  ECHOCARDIOGRAM COMPLETE 02/26/2024  Narrative ECHOCARDIOGRAM REPORT    Patient Name:   Mike Hunter Date of Exam: 02/26/2024 Medical Rec #:  [id number]     Height:       70.0 in Accession #:    [id number]    Weight:       275.1 lb Date of Birth:  [birth year]      BSA:          2.390 m Patient Age:    73 years      BP:  111/75 mmHg Patient Gender: M             HR:           87 bpm. Exam Location:  Inpatient  Procedure: 2D Echo and Intracardiac Opacification Agent (Both Spectral and Color Flow Doppler were utilized during procedure).  Indications:     Dyspnea  History:         Patient has prior history of Echocardiogram examinations. Aortic Valve Disease, Arrythmias:Atrial Fibrillation; Signs/Symptoms:Dyspnea.  Sonographer:     Charmaine Gaskins Referring Phys:  [id number] A THOMAS Diagnosing Phys: Lonni Nanas MD  IMPRESSIONS   1. Left ventricular ejection fraction, by estimation, is 45 to 50%. The left ventricle has mildly decreased function. The left ventricle demonstrates global hypokinesis. The left ventricular internal cavity size was mildly dilated. Left ventricular diastolic parameters are indeterminate. 2. Right ventricular systolic function is normal. The right ventricular size is normal. There is mildly elevated pulmonary artery systolic pressure. The estimated right ventricular systolic pressure is 37.8 mmHg. 3. Left atrial size was mildly dilated. 4. The mitral valve is normal in structure. Mild mitral valve regurgitation. 5. The aortic valve is calcified. Aortic valve regurgitation is not visualized. Mild to moderate aortic valve stenosis. Vmax 2.8 m/s, MG 21 mmHg, AVA 1.6 cm^2, DI  0.45 6. The inferior vena cava is normal in size with greater than 50% respiratory variability, suggesting right atrial pressure of 3 mmHg.  FINDINGS Left Ventricle: Left ventricular ejection fraction, by estimation, is 45 to 50%. The left ventricle has mildly decreased function. The left ventricle demonstrates global hypokinesis. Definity  contrast agent was given IV to delineate the left ventricular endocardial borders. The left ventricular internal cavity size was mildly dilated. There is no left ventricular hypertrophy. Left ventricular diastolic parameters are indeterminate.  Right Ventricle: The right ventricular size is normal. No increase in right ventricular wall thickness. Right ventricular systolic function is normal. There is mildly elevated pulmonary artery systolic pressure. The tricuspid regurgitant velocity is 2.95 m/s, and with an assumed right atrial pressure of 3 mmHg, the estimated right ventricular systolic pressure is 37.8 mmHg.  Left Atrium: Left atrial size was mildly dilated.  Right Atrium: Right atrial size was normal in size.  Pericardium: Trivial pericardial effusion is present.  Mitral Valve: The mitral valve is normal in structure. Mild mitral valve regurgitation.  Tricuspid Valve: The tricuspid valve is normal in structure. Tricuspid valve regurgitation is mild.  Aortic Valve: The aortic valve is calcified. Aortic valve regurgitation is not visualized. Mild to moderate aortic stenosis is present. Aortic valve mean gradient measures 18.6 mmHg. Aortic valve peak gradient measures 27.5 mmHg. Aortic valve area, by VTI measures 1.39 cm.  Pulmonic Valve: The pulmonic valve was not well visualized. Pulmonic valve regurgitation is not visualized.  Aorta: The aortic root and ascending aorta are structurally normal, with no evidence of dilitation.  Venous: The inferior vena cava is normal in size with greater than 50% respiratory variability, suggesting right atrial  pressure of 3 mmHg.  IAS/Shunts: The atrial septum is grossly normal.   LEFT VENTRICLE PLAX 2D LVIDd:         6.00 cm      Diastology LVIDs:         4.60 cm      LV e' medial:    7.87 cm/s LV PW:         0.90 cm      LV E/e' medial:  14.2 LV IVS:  1.00 cm      LV e' lateral:   8.01 cm/s LVOT diam:     2.20 cm      LV E/e' lateral: 13.9 LV SV:         77 LV SV Index:   32 LVOT Area:     3.80 cm  LV Volumes (MOD) LV vol d, MOD A2C: 122.0 ml LV vol d, MOD A4C: 121.0 ml LV vol s, MOD A2C: 43.7 ml LV vol s, MOD A4C: 76.9 ml LV SV MOD A2C:     78.3 ml LV SV MOD A4C:     121.0 ml LV SV MOD BP:      64.1 ml  RIGHT VENTRICLE RV Basal diam:  3.50 cm RV Mid diam:    3.60 cm RV S prime:     11.20 cm/s  LEFT ATRIUM             Index        RIGHT ATRIUM           Index LA diam:        4.80 cm 2.01 cm/m   RA Area:     19.10 cm LA Vol (A2C):   89.8 ml 37.57 ml/m  RA Volume:   47.80 ml  20.00 ml/m LA Vol (A4C):   87.9 ml 36.77 ml/m LA Biplane Vol: 91.9 ml 38.45 ml/m AORTIC VALVE AV Area (Vmax):    1.52 cm AV Area (Vmean):   1.39 cm AV Area (VTI):     1.39 cm AV Vmax:           262.40 cm/s AV Vmean:          209.000 cm/s AV VTI:            0.554 m AV Peak Grad:      27.5 mmHg AV Mean Grad:      18.6 mmHg LVOT Vmax:         104.92 cm/s LVOT Vmean:        76.200 cm/s LVOT VTI:          0.202 m LVOT/AV VTI ratio: 0.36  AORTA Ao Asc diam: 3.40 cm  MITRAL VALVE                TRICUSPID VALVE MV Area (PHT): 4.70 cm     TR Peak grad:   34.8 mmHg MV Decel Time: 161 msec     TR Vmax:        295.00 cm/s MV E velocity: 111.67 cm/s SHUNTS Systemic VTI:  0.20 m Systemic Diam: 2.20 cm  Lonni Nanas MD Electronically signed by Lonni Nanas MD Signature Date/Time: 02/26/2024/2:34:07 PM    Final (Updated)        CARDIAC MRI  MR CARDIAC MORPHOLOGY W WO CONTRAST 02/29/2024  Narrative CLINICAL DATA:  Clinical question of scar formation Study  assumes HCT of 32 and BSA of 2.49 m2.  EXAM: CARDIAC MRI  TECHNIQUE: The patient was scanned on a 1.5 Tesla GE magnet. A dedicated cardiac coil was used. Functional imaging was done using Fiesta sequences. 2,3, and 4 chamber views were done to assess for RWMA's. Modified Simpson's rule using was used to calculate an ejection fraction on a dedicated work [employer]. The patient received 10 cc of Gadavist . After 10 minutes inversion recovery sequences were used to assess for infiltration and scar tissue. Flow quantification was performed 2 times during this examination with flow quantification performed at the levels of  the ascending aorta above the valve, pulmonary artery above the valve.  CONTRAST:  10 cc  of Gadavist   FINDINGS: 1. Normal left ventricular size, with LVEDD 55 mm, and LVEDVi 59 mL/m2.  Normal left ventricular thickness.  Moderate decrease ventricular systolic function (LVEF =35%). Patient is free breathing and in a irregular rapid rhythm. Grossly suggestive of global hypokinesis. Unable to perform accurate strain.  Left ventricular parametric mapping notable for elevated native T1 in the basal lateral (1372 ms), poor post contrast T1 comparison for ECV, and increased T2 in the basal lateral (59 ms).  There is late gadolinium enhancement in the left ventricular myocardium, that is subendocardial, in the inferior lateral wall. This accounts for 75% of the wall.  Cannot exclude abnormal perfusion in the mid inferolateral.  2. Normal right ventricular size with RVEDVI 53 mL/m2.  Normal right ventricular thickness.  Moderate decrease in the right ventricular systolic function (RVEF =31%). There are no regional wall motion abnormalities or aneurysms but global hypokinesis.  3.  Normal right atrial size with left atrial enlargement.  4. Normal size of the aortic root, ascending aorta. Moderate dilation of the main pulmonary artery, 31  mm.  5. Valve assessment:  Aortic Valve: Tri-leaflet and calcified valve. Mean gradient 4 mm Hg using anti-aliasing. Regurgitant fraction of 21% may overestimate regurgitation in the setting of RVR acquisition.  Pulmonic Valve: Mild regurgitation, regurgitant fraction of 8% may overestimate regurgitation in the setting of RVR acquisition.  Tricuspid Valve: Sub-optimal view of valve morphology. Qualitatively mild, central regurgitation.  Mitral Valve: Mild central regurgitation. Regurgitant fraction of 13%.  6. Normal pericardium. Small basal lateral pericardial effusion without evidence of tamponade physiology.  7. Grossly, bilateral pleural effusion noted but no extracardiac findings. Recommended dedicated study if concerned for non-cardiac pathology.  8. Difficult acquisition in the setting of free breathing and AF RVR.  IMPRESSION: 1. LVEF 35% in the setting of AF RVR. 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 In correlation with lab testing showing no acute infarction this admission, this is less likely a viable territory.  2.  RVEF 31% in the setting of AF RVR.  3.  Bilateral pleural effusions incidentally noted.  Stanly Leavens MD   Electronically Signed By: Stanly Leavens M.D. 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) - managed by EP: on amiodarone  PO with prolonged IV load and metoprolol  succinate  Persistent AF Query of AFL today - S/p 9/125/25  - consented for TEE/DCCV today (planned for 1 PM) - AC and BB as above  Acute on Chronic HF - NYHA I, hypervolemic and much improved - doing well on torsemide  40 mg PO BID;  creatinine has improved - continue MRA (25 mg PO Daily) - BB as above - outpatient SGLT2i (high risk of yeast infection) - Stopped Imdur  today to start losartan  25 mg PO daily, next outpatient medication change will likely be ARB up-titration vs ARNI transition if feasible from BP stand point  Mild AS 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 - BMI is 30+  OSA and DM - would meet criteria for GLP-1 based therapy - discussed protein intake, 25 g fiber intake, small meals, and decreasing high fats to minimize symptom burden - after outpatient f/u visit, would recommend Pharm D eval for consideration  Likely discharge today if doing well from IM standpoint post TEE/DCCV   For questions or updates, please contact [facility] Please consult [email or web address] for contact info under Cardiology/STEMI.      Stanly Leavens, MD FASE [facility] Cardiologist [facility]  [address] [place], [address] [place], [place] [address] [phone]  8:19 AM

## 2024-03-04 NOTE — Plan of Care (Signed)
   Problem: Education: Goal: Knowledge of General Education information will improve Description: Including pain rating scale, medication(s)/side effects and non-pharmacologic comfort measures Outcome: Progressing   Problem: Clinical Measurements: Goal: Diagnostic test results will improve Outcome: Progressing   Problem: Activity: Goal: Risk for activity intolerance will decrease Outcome: Progressing

## 2024-03-04 NOTE — Interval H&P Note (Signed)
 History and Physical Interval Note:  03/04/2024 1:05 PM  Mike Hunter  has presented today for surgery, with the diagnosis of afib.  The various methods of treatment have been discussed with the patient and family. After consideration of risks, benefits and other options for treatment, the patient has consented to  Procedure(s): TRANSESOPHAGEAL ECHOCARDIOGRAM (N/A) CARDIOVERSION (N/A) as a surgical intervention.  The patient's history has been reviewed, patient examined, no change in status, stable for surgery.  I have reviewed the patient's chart and labs.  Questions were answered to the patient's satisfaction.     Ladon Heney

## 2024-03-04 NOTE — Op Note (Signed)
 INDICATIONS: Atrial fibrillation  PROCEDURE:   Informed consent was obtained prior to the procedure. The risks, benefits and alternatives for the procedure were discussed and the patient comprehended these risks.  Risks include, but are not limited to, cough, sore throat, vomiting, nausea, somnolence, esophageal and stomach trauma or perforation, bleeding, low blood pressure, aspiration, pneumonia, infection, trauma to the teeth and death.    After a procedural time-out, the oropharynx was anesthetized with 20% benzocaine spray.   During this procedure the patient was administered IV propofol  by Anesthesiology, Dr. Keneth  The transesophageal probe was inserted in the esophagus and stomach without difficulty and multiple views were obtained.  The patient was kept under observation until the patient left the procedure room.  The patient left the procedure room in stable condition.   Agitated microbubble saline contrast was not administered.  COMPLICATIONS:    There were no immediate complications.  FINDINGS:  No LA thrombus. Moderately dilated LA. Moderately reduced LVEF 35-40%. No major valvular hemodynamic abnormalities, but the aortic valve is functionally bicuspid, with left-noncoronary raphe.  RECOMMENDATIONS:     Proceed with cardioversion.  Time Spent Directly with the Patient:  40 minutes   Mike Hunter 03/04/2024, 1:51 PM

## 2024-03-04 NOTE — Care Management Important Message (Signed)
 Important Message  Patient Details  Name: Mike Hunter MRN: [id number] Date of Birth: 02-May-1951   Important Message Given:  Yes - Medicare IM     Vonzell Arrie Sharps 03/04/2024, 11:29 AM

## 2024-03-04 NOTE — Transfer of Care (Signed)
 Immediate Anesthesia Transfer of Care Note  Patient: Mike Hunter  Procedure(s) Performed: TRANSESOPHAGEAL ECHOCARDIOGRAM CARDIOVERSION  Patient Location: Cath Lab  Anesthesia Type:MAC  Level of Consciousness: drowsy and responds to stimulation  Airway & Oxygen Therapy: Patient Spontanous Breathing and Patient connected to nasal cannula oxygen  Post-op Assessment: Report given to RN and Post -op Vital signs reviewed and stable  Post vital signs: Reviewed and stable  Last Vitals:  Vitals Value Taken Time  BP 110/66 03/04/24 14:00  Temp    Pulse 94 03/04/24 14:00  Resp 16 03/04/24 14:00  SpO2 100 % 03/04/24 14:00  Vitals shown include unfiled device data.  Last Pain:  Vitals:   03/04/24 1235  TempSrc: Temporal  PainSc:       Patients Stated Pain Goal: 0 (03/03/24 0945)  Complications: No notable events documented.

## 2024-03-04 NOTE — H&P (View-Only) (Signed)
 Progress Note  Patient Name: Mike Hunter Date of Encounter: 03/04/2024 Primary Cardiologist: New to Dr. Santo   Subjective   Continues to diurese with improves symptoms.  Appears to be in AFL today  Vital Signs    Vitals:   03/04/24 0500 03/04/24 0504 03/04/24 0600 03/04/24 0745  BP:  111/66  120/88  Pulse: 100 (!) 35  87  Resp: 15 18 19 13   Temp:  97.8 F (36.6 C)  97.6 F (36.4 C)  TempSrc:  Oral  Oral  SpO2: 100% 100%  100%  Weight:      Height:        Intake/Output Summary (Last 24 hours) at 03/04/2024 0819 Last data filed at 03/04/2024 0704 Gross per 24 hour  Intake 1328.01 ml  Output 2700 ml  Net -1371.99 ml   Filed Weights   02/28/24 0430 02/29/24 0447 03/01/24 0023  Weight: 124.7 kg 125.2 kg 124.8 kg    Physical Exam   GEN: No acute distress.   Neck: No JVD  Cardiac: IRIR rate controlled no rubs, or gallops. Systolic murmur Respiratory: CTAB GI: Soft, nontender, distended with fluid wave improved MS: No edema  Labs   Telemetry: AF RVR-> AFL rate controlled with V pacing   Chemistry Recent Labs  Lab 02/27/24 1437 02/28/24 0340 03/02/24 0238 03/03/24 0240 03/04/24 0235  NA 136   < > 137 139 140  K 3.8   < > 3.9 3.6 3.5  CL 104   < > 105 100 99  CO2 20*   < > 23 22 23   GLUCOSE 327*   < > 265* 196* 318*  BUN 20   < > 29* 31* 33*  CREATININE 1.65*   < > 1.53* 1.74* 1.49*  CALCIUM  8.3*   < > 8.5* 8.6* 8.7*  PROT 6.7  --   --  6.7 6.7  ALBUMIN  2.9*   < > 2.6* 2.7* 2.7*  AST 17  --   --  16 14*  ALT 19  --   --  17 19  ALKPHOS 80  --   --  79 86  BILITOT 1.2  --   --  0.6 0.6  GFRNONAA 43*   < > 47* 40* 49*  ANIONGAP 12   < > 9 17* 18*   < > = values in this interval not displayed.     Hematology Recent Labs  Lab 03/02/24 0238 03/03/24 0240 03/04/24 0235  WBC 10.2 10.3 9.8  RBC 4.47 4.54 4.69  HGB 9.7* 10.0* 10.2*  HCT 33.2* 33.4* 34.7*  MCV 74.3* 73.6* 74.0*  MCH 21.7* 22.0* 21.7*  MCHC 29.2* 29.9* 29.4*  RDW  17.6* 17.6* 17.5*  PLT 330 318 319   BNP Recent Labs  Lab 02/27/24 1103  BNP 222.0*     Cardiac Studies   Cardiac Studies & Procedures   ______________________________________________________________________________________________     ECHOCARDIOGRAM  ECHOCARDIOGRAM COMPLETE 02/26/2024  Narrative ECHOCARDIOGRAM REPORT    Patient Name:   Mike Hunter Date of Exam: 02/26/2024 Medical Rec #:  [id number]     Height:       70.0 in Accession #:    [id number]    Weight:       275.1 lb Date of Birth:  [birth year]      BSA:          2.390 m Patient Age:    73 years      BP:  111/75 mmHg Patient Gender: M             HR:           87 bpm. Exam Location:  Inpatient  Procedure: 2D Echo and Intracardiac Opacification Agent (Both Spectral and Color Flow Doppler were utilized during procedure).  Indications:     Dyspnea  History:         Patient has prior history of Echocardiogram examinations. Aortic Valve Disease, Arrythmias:Atrial Fibrillation; Signs/Symptoms:Dyspnea.  Sonographer:     Charmaine Gaskins Referring Phys:  [id number] A THOMAS Diagnosing Phys: Lonni Nanas MD  IMPRESSIONS   1. Left ventricular ejection fraction, by estimation, is 45 to 50%. The left ventricle has mildly decreased function. The left ventricle demonstrates global hypokinesis. The left ventricular internal cavity size was mildly dilated. Left ventricular diastolic parameters are indeterminate. 2. Right ventricular systolic function is normal. The right ventricular size is normal. There is mildly elevated pulmonary artery systolic pressure. The estimated right ventricular systolic pressure is 37.8 mmHg. 3. Left atrial size was mildly dilated. 4. The mitral valve is normal in structure. Mild mitral valve regurgitation. 5. The aortic valve is calcified. Aortic valve regurgitation is not visualized. Mild to moderate aortic valve stenosis. Vmax 2.8 m/s, MG 21 mmHg, AVA 1.6 cm^2, DI  0.45 6. The inferior vena cava is normal in size with greater than 50% respiratory variability, suggesting right atrial pressure of 3 mmHg.  FINDINGS Left Ventricle: Left ventricular ejection fraction, by estimation, is 45 to 50%. The left ventricle has mildly decreased function. The left ventricle demonstrates global hypokinesis. Definity  contrast agent was given IV to delineate the left ventricular endocardial borders. The left ventricular internal cavity size was mildly dilated. There is no left ventricular hypertrophy. Left ventricular diastolic parameters are indeterminate.  Right Ventricle: The right ventricular size is normal. No increase in right ventricular wall thickness. Right ventricular systolic function is normal. There is mildly elevated pulmonary artery systolic pressure. The tricuspid regurgitant velocity is 2.95 m/s, and with an assumed right atrial pressure of 3 mmHg, the estimated right ventricular systolic pressure is 37.8 mmHg.  Left Atrium: Left atrial size was mildly dilated.  Right Atrium: Right atrial size was normal in size.  Pericardium: Trivial pericardial effusion is present.  Mitral Valve: The mitral valve is normal in structure. Mild mitral valve regurgitation.  Tricuspid Valve: The tricuspid valve is normal in structure. Tricuspid valve regurgitation is mild.  Aortic Valve: The aortic valve is calcified. Aortic valve regurgitation is not visualized. Mild to moderate aortic stenosis is present. Aortic valve mean gradient measures 18.6 mmHg. Aortic valve peak gradient measures 27.5 mmHg. Aortic valve area, by VTI measures 1.39 cm.  Pulmonic Valve: The pulmonic valve was not well visualized. Pulmonic valve regurgitation is not visualized.  Aorta: The aortic root and ascending aorta are structurally normal, with no evidence of dilitation.  Venous: The inferior vena cava is normal in size with greater than 50% respiratory variability, suggesting right atrial  pressure of 3 mmHg.  IAS/Shunts: The atrial septum is grossly normal.   LEFT VENTRICLE PLAX 2D LVIDd:         6.00 cm      Diastology LVIDs:         4.60 cm      LV e' medial:    7.87 cm/s LV PW:         0.90 cm      LV E/e' medial:  14.2 LV IVS:  1.00 cm      LV e' lateral:   8.01 cm/s LVOT diam:     2.20 cm      LV E/e' lateral: 13.9 LV SV:         77 LV SV Index:   32 LVOT Area:     3.80 cm  LV Volumes (MOD) LV vol d, MOD A2C: 122.0 ml LV vol d, MOD A4C: 121.0 ml LV vol s, MOD A2C: 43.7 ml LV vol s, MOD A4C: 76.9 ml LV SV MOD A2C:     78.3 ml LV SV MOD A4C:     121.0 ml LV SV MOD BP:      64.1 ml  RIGHT VENTRICLE RV Basal diam:  3.50 cm RV Mid diam:    3.60 cm RV S prime:     11.20 cm/s  LEFT ATRIUM             Index        RIGHT ATRIUM           Index LA diam:        4.80 cm 2.01 cm/m   RA Area:     19.10 cm LA Vol (A2C):   89.8 ml 37.57 ml/m  RA Volume:   47.80 ml  20.00 ml/m LA Vol (A4C):   87.9 ml 36.77 ml/m LA Biplane Vol: 91.9 ml 38.45 ml/m AORTIC VALVE AV Area (Vmax):    1.52 cm AV Area (Vmean):   1.39 cm AV Area (VTI):     1.39 cm AV Vmax:           262.40 cm/s AV Vmean:          209.000 cm/s AV VTI:            0.554 m AV Peak Grad:      27.5 mmHg AV Mean Grad:      18.6 mmHg LVOT Vmax:         104.92 cm/s LVOT Vmean:        76.200 cm/s LVOT VTI:          0.202 m LVOT/AV VTI ratio: 0.36  AORTA Ao Asc diam: 3.40 cm  MITRAL VALVE                TRICUSPID VALVE MV Area (PHT): 4.70 cm     TR Peak grad:   34.8 mmHg MV Decel Time: 161 msec     TR Vmax:        295.00 cm/s MV E velocity: 111.67 cm/s SHUNTS Systemic VTI:  0.20 m Systemic Diam: 2.20 cm  Lonni Nanas MD Electronically signed by Lonni Nanas MD Signature Date/Time: 02/26/2024/2:34:07 PM    Final (Updated)        CARDIAC MRI  MR CARDIAC MORPHOLOGY W WO CONTRAST 02/29/2024  Narrative CLINICAL DATA:  Clinical question of scar formation Study  assumes HCT of 32 and BSA of 2.49 m2.  EXAM: CARDIAC MRI  TECHNIQUE: The patient was scanned on a 1.5 Tesla GE magnet. A dedicated cardiac coil was used. Functional imaging was done using Fiesta sequences. 2,3, and 4 chamber views were done to assess for RWMA's. Modified Simpson's rule using was used to calculate an ejection fraction on a dedicated work [employer]. The patient received 10 cc of Gadavist . After 10 minutes inversion recovery sequences were used to assess for infiltration and scar tissue. Flow quantification was performed 2 times during this examination with flow quantification performed at the levels of  the ascending aorta above the valve, pulmonary artery above the valve.  CONTRAST:  10 cc  of Gadavist   FINDINGS: 1. Normal left ventricular size, with LVEDD 55 mm, and LVEDVi 59 mL/m2.  Normal left ventricular thickness.  Moderate decrease ventricular systolic function (LVEF =35%). Patient is free breathing and in a irregular rapid rhythm. Grossly suggestive of global hypokinesis. Unable to perform accurate strain.  Left ventricular parametric mapping notable for elevated native T1 in the basal lateral (1372 ms), poor post contrast T1 comparison for ECV, and increased T2 in the basal lateral (59 ms).  There is late gadolinium enhancement in the left ventricular myocardium, that is subendocardial, in the inferior lateral wall. This accounts for 75% of the wall.  Cannot exclude abnormal perfusion in the mid inferolateral.  2. Normal right ventricular size with RVEDVI 53 mL/m2.  Normal right ventricular thickness.  Moderate decrease in the right ventricular systolic function (RVEF =31%). There are no regional wall motion abnormalities or aneurysms but global hypokinesis.  3.  Normal right atrial size with left atrial enlargement.  4. Normal size of the aortic root, ascending aorta. Moderate dilation of the main pulmonary artery, 31  mm.  5. Valve assessment:  Aortic Valve: Tri-leaflet and calcified valve. Mean gradient 4 mm Hg using anti-aliasing. Regurgitant fraction of 21% may overestimate regurgitation in the setting of RVR acquisition.  Pulmonic Valve: Mild regurgitation, regurgitant fraction of 8% may overestimate regurgitation in the setting of RVR acquisition.  Tricuspid Valve: Sub-optimal view of valve morphology. Qualitatively mild, central regurgitation.  Mitral Valve: Mild central regurgitation. Regurgitant fraction of 13%.  6. Normal pericardium. Small basal lateral pericardial effusion without evidence of tamponade physiology.  7. Grossly, bilateral pleural effusion noted but no extracardiac findings. Recommended dedicated study if concerned for non-cardiac pathology.  8. Difficult acquisition in the setting of free breathing and AF RVR.  IMPRESSION: 1. LVEF 35% in the setting of AF RVR. 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 In correlation with lab testing showing no acute infarction this admission, this is less likely a viable territory.  2.  RVEF 31% in the setting of AF RVR.  3.  Bilateral pleural effusions incidentally noted.  Stanly Leavens MD   Electronically Signed By: Stanly Leavens M.D. 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) - managed by EP: on amiodarone  PO with prolonged IV load and metoprolol  succinate  Persistent AF Query of AFL today - S/p 9/125/25  - consented for TEE/DCCV today (planned for 1 PM) - AC and BB as above  Acute on Chronic HF - NYHA I, hypervolemic and much improved - doing well on torsemide  40 mg PO BID;  creatinine has improved - continue MRA (25 mg PO Daily) - BB as above - outpatient SGLT2i (high risk of yeast infection) - Stopped Imdur  today to start losartan  25 mg PO daily, next outpatient medication change will likely be ARB up-titration vs ARNI transition if feasible from BP stand point  Mild AS 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 - BMI is 30+  OSA and DM - would meet criteria for GLP-1 based therapy - discussed protein intake, 25 g fiber intake, small meals, and decreasing high fats to minimize symptom burden - after outpatient f/u visit, would recommend Pharm D eval for consideration  Likely discharge today if doing well from IM standpoint post TEE/DCCV   For questions or updates, please contact [facility] Please consult [email or web address] for contact info under Cardiology/STEMI.      Stanly Leavens, MD FASE [facility] Cardiologist [facility]  [address] [place], [address] [place], [place] [address] [phone]  8:19 AM

## 2024-03-04 NOTE — Anesthesia Preprocedure Evaluation (Signed)
 Anesthesia Evaluation  Patient identified by MRN, date of birth, ID band Patient awake    Reviewed: Allergy & Precautions, NPO status , Patient's Chart, lab work & pertinent test results, reviewed documented beta blocker date and time   History of Anesthesia Complications Negative for: history of anesthetic complications  Airway Mallampati: IV  TM Distance: >3 FB   Mouth opening: Limited Mouth Opening  Dental  (+) Missing,    Pulmonary asthma , sleep apnea, Continuous Positive Airway Pressure Ventilation and Oxygen sleep apnea , pneumonia, COPD,  COPD inhaler, former smoker   breath sounds clear to auscultation       Cardiovascular hypertension, (-) angina + CAD and +CHF  (-) Past MI + dysrhythmias Atrial Fibrillation  Rhythm:Irregular Rate:Normal  IMPRESSIONS     1. Left ventricular ejection fraction, by estimation, is 45 to 50%. The  left ventricle has mildly decreased function. The left ventricle  demonstrates global hypokinesis. The left ventricular internal cavity size  was mildly dilated. Left ventricular  diastolic parameters are indeterminate.   2. Right ventricular systolic function is normal. The right ventricular  size is normal. There is mildly elevated pulmonary artery systolic  pressure. The estimated right ventricular systolic pressure is 37.8 mmHg.   3. Left atrial size was mildly dilated.   4. The mitral valve is normal in structure. Mild mitral valve  regurgitation.   5. The aortic valve is calcified. Aortic valve regurgitation is not  visualized. Mild to moderate aortic valve stenosis. Vmax 2.8 m/s, MG 21  mmHg, AVA 1.6 cm^2, DI 0.45   6. The inferior vena cava is normal in size with greater than 50%  respiratory variability, suggesting right atrial pressure of 3 mmHg.      Neuro/Psych neg Seizures PSYCHIATRIC DISORDERS Anxiety Depression Bipolar Disorder      GI/Hepatic ,GERD  ,,  Endo/Other   diabetes, Type 2    Renal/GU Renal disease     Musculoskeletal  (+) Arthritis ,    Abdominal   Peds  Hematology  (+) Blood dyscrasia, anemia   Anesthesia Other Findings   Reproductive/Obstetrics                              Anesthesia Physical Anesthesia Plan  ASA: 3  Anesthesia Plan: MAC   Post-op Pain Management:    Induction: Intravenous  PONV Risk Score and Plan: 2 and Propofol  infusion  Airway Management Planned: Natural Airway and Nasal Cannula  Additional Equipment:   Intra-op Plan:   Post-operative Plan:   Informed Consent: I have reviewed the patients History and Physical, chart, labs and discussed the procedure including the risks, benefits and alternatives for the proposed anesthesia with the patient or authorized representative who has indicated his/her understanding and acceptance.     Dental advisory given  Plan Discussed with: CRNA  Anesthesia Plan Comments:          Anesthesia Quick Evaluation

## 2024-03-04 NOTE — Anesthesia Postprocedure Evaluation (Signed)
 Anesthesia Post Note  Patient: Mike Hunter  Procedure(s) Performed: TRANSESOPHAGEAL ECHOCARDIOGRAM CARDIOVERSION     Patient location during evaluation: PACU Anesthesia Type: MAC Level of consciousness: awake and alert Pain management: pain level controlled Vital Signs Assessment: post-procedure vital signs reviewed and stable Respiratory status: spontaneous breathing, nonlabored ventilation, respiratory function stable and patient connected to nasal cannula oxygen Cardiovascular status: blood pressure returned to baseline and stable Postop Assessment: no apparent nausea or vomiting Anesthetic complications: no   No notable events documented.  Last Vitals:  Vitals:   03/04/24 1425 03/04/24 1435  BP: 102/75 99/63  Pulse: 100 86  Resp: 16 (!) 4  Temp:    SpO2: 100% 100%    Last Pain:  Vitals:   03/04/24 1404  TempSrc: Temporal  PainSc:                  [name]

## 2024-03-04 NOTE — Progress Notes (Signed)
 Gen cards f/u placed on AVS

## 2024-03-04 NOTE — Progress Notes (Signed)
 Physical Therapy Treatment Patient Details Name: Mike Hunter MRN: [id number] DOB: [birth year] Today's Date: 03/04/2024   History of Present Illness Pt is a 73 y/o male admitted 9/11 with 1 week of palpitations from afib with RVR.  PMHx:  Afib, bipolar d/o, CHF, DM, HTN, reflux, ICD implantation    PT Comments  Pt is progressing steadily toward goals, but will need some extra assist in the short term that is best addressed with rehab <3 hours /day.  Emphasis on education, transitions to EOB from supine following ICD prec, STS and progression of gait with the RW, monitoring the HR due to afib with RVR.    If plan is discharge home, recommend the following: A little help with bathing/dressing/bathroom;Assistance with cooking/housework;Assist for transportation;Help with stairs or ramp for entrance   Can travel by private vehicle     No  Equipment Recommendations  None recommended by PT    Recommendations for Other Services       Precautions / Restrictions Precautions Precautions: Fall Recall of Precautions/Restrictions: Intact Restrictions Weight Bearing Restrictions Per Provider Order: No     Mobility  Bed Mobility Overal bed mobility: Needs Assistance Bed Mobility: Sit to Sidelying, Sidelying to Sit Rolling: Mod assist Sidelying to sit: Mod assist     Sit to sidelying: Mod assist      Transfers Overall transfer level: Needs assistance Equipment used: Rolling walker (2 wheels) Transfers: Sit to/from Stand Sit to Stand: Contact guard assist           General transfer comment: cues for hand placement, STS x2    Ambulation/Gait Ambulation/Gait assistance: Contact guard assist Gait Distance (Feet): 80 Feet Assistive device: Rolling walker (2 wheels) Gait Pattern/deviations: Step-through pattern   Gait velocity interpretation: <1.8 ft/sec, indicate of risk for recurrent falls   General Gait Details: slower, tentative, mildly unsteady gait, cues for safe  proximity to the RW.  Afib with RVR to 133 bpm witnessed.   Stairs             Wheelchair Mobility     Tilt Bed    Modified Rankin (Stroke Patients Only)       Balance Overall balance assessment: Needs assistance   Sitting balance-Leahy Scale: Good       Standing balance-Leahy Scale: Fair                              Hotel manager: Impaired  Cognition Arousal: Alert Behavior During Therapy: WFL for tasks assessed/performed   PT - Cognitive impairments: No apparent impairments                         Following commands: Intact      Cueing Cueing Techniques: Verbal cues  Exercises      General Comments General comments (skin integrity, edema, etc.): reinforced pacer precautions,  Pt tolerated the gait trial well.      Pertinent Vitals/Pain Pain Assessment Faces Pain Scale: Hurts little more Pain Location: L shoulder Pain Descriptors / Indicators: Sore Pain Intervention(s): Monitored during session    Home Living                          Prior Function            PT Goals (current goals can now be found in the care plan section) Acute Rehab PT Goals PT Goal Formulation:  With patient Potential to Achieve Goals: Good Progress towards PT goals: Progressing toward goals    Frequency    Min 3X/week      PT Plan      Co-evaluation              AM-PAC PT 6 Clicks Mobility   Outcome Measure  Help needed turning from your back to your side while in a flat bed without using bedrails?: A Little Help needed moving from lying on your back to sitting on the side of a flat bed without using bedrails?: A Lot Help needed moving to and from a bed to a chair (including a wheelchair)?: A Little Help needed standing up from a chair using your arms (e.g., wheelchair or bedside chair)?: A Little Help needed to walk in hospital room?: A Little Help needed climbing 3-5 steps with a  railing? : A Lot 6 Click Score: 16    End of Session   Activity Tolerance: Patient tolerated treatment well Patient left: in bed;with call bell/phone within reach Nurse Communication: Mobility status PT Visit Diagnosis: Other abnormalities of gait and mobility (R26.89);Pain Pain - Right/Left: Left Pain - part of body: Shoulder     Time: 8361-8297 PT Time Calculation (min) (ACUTE ONLY): 24 min  Charges:    $Gait Training: 8-22 mins $Therapeutic Activity: 8-22 mins PT General Charges $$ ACUTE PT VISIT: 1 Visit                     03/04/2024  [place] HERO., PT Acute Rehabilitation Services [phone]  (office)   Vinie GAILS Aubra Pappalardo 03/04/2024, 5:19 PM

## 2024-03-04 NOTE — Progress Notes (Signed)
 Mobility Specialist Progress Note:    03/04/24 1155  Mobility  Activity Ambulated with assistance;Pivoted/transferred from bed to chair;Pivoted/transferred from chair to bed;Dangled on edge of bed  Level of Assistance Contact guard assist, steadying assist  Assistive Device Other (Comment) (HHA)  Distance Ambulated (ft) 5 ft  Activity Response Tolerated well  Mobility Referral Yes  Mobility visit 1 Mobility  Mobility Specialist Start Time (ACUTE ONLY) 1155  Mobility Specialist Stop Time (ACUTE ONLY) 1225  Mobility Specialist Time Calculation (min) (ACUTE ONLY) 30 min   Received pt laying in bed agreeable to session.Pt express concern about procedure today, being d/c, and wife being able to take care of him. Would prefer to go home and get a home health aide rather than being in a rehab facility again. No c/o any symptoms. Pt demonstrated being able to sup>sit>stand using right arm while maintaining pacemaker precautions. Pt took steps to transfer to chair and back to bed before being taken to procedure. Left pt in room w/ all needs met.   Venetia Keel Mobility Specialist Please Neurosurgeon or Rehab Office at [phone]

## 2024-03-04 NOTE — Op Note (Signed)
 Procedure: Electrical Cardioversion Indications:  Atrial Fibrillation  Procedure Details:  Consent: Risks of procedure as well as the alternatives and risks of each were explained to the (patient/caregiver).  Consent for procedure obtained.  Time Out: Verified patient identification, verified procedure, site/side was marked, verified correct patient position, special equipment/implants available, medications/allergies/relevent history reviewed, required imaging and test results available.  Performed  Patient placed on cardiac monitor, pulse oximetry, supplemental oxygen as necessary.  Sedation given: propofol  IV, Anesthesiology, Dr. Keneth Pacer pads placed anterior and posterior chest.  Cardioverted 3 time(s).  Cardioversion with synchronized biphasic shocks at  200J, 360 J, 360 J with pressure on chest pad.  Evaluation: Findings: Post procedure EKG shows: Atrial Fibrillation Complications: None Patient did tolerate procedure well.  Time Spent Directly with the Patient:  20 minutes   Mike Hunter 03/04/2024, 1:54 PM

## 2024-03-04 NOTE — Interval H&P Note (Signed)
 History and Physical Interval Note:  03/04/2024 12:26 PM  Mike Hunter  has presented today for surgery, with the diagnosis of afib.  The various methods of treatment have been discussed with the patient and family. After consideration of risks, benefits and other options for treatment, the patient has consented to  Procedure(s): TRANSESOPHAGEAL ECHOCARDIOGRAM (N/A) CARDIOVERSION (N/A) as a surgical intervention.  The patient's history has been reviewed, patient examined, no change in status, stable for surgery.  I have reviewed the patient's chart and labs.  Questions were answered to the patient's satisfaction.     Mike Hunter

## 2024-03-05 ENCOUNTER — Inpatient Hospital Stay (HOSPITAL_COMMUNITY)

## 2024-03-05 DIAGNOSIS — I5020 Unspecified systolic (congestive) heart failure: Secondary | ICD-10-CM | POA: Diagnosis not present

## 2024-03-05 DIAGNOSIS — I4892 Unspecified atrial flutter: Secondary | ICD-10-CM | POA: Diagnosis not present

## 2024-03-05 DIAGNOSIS — I4891 Unspecified atrial fibrillation: Secondary | ICD-10-CM | POA: Diagnosis not present

## 2024-03-05 DIAGNOSIS — R079 Chest pain, unspecified: Secondary | ICD-10-CM | POA: Diagnosis not present

## 2024-03-05 LAB — GLUCOSE, CAPILLARY
Glucose-Capillary: 200 mg/dL — ABNORMAL HIGH (ref 70–99)
Glucose-Capillary: 280 mg/dL — ABNORMAL HIGH (ref 70–99)
Glucose-Capillary: 134 mg/dL — ABNORMAL HIGH (ref 70–99)
Glucose-Capillary: 174 mg/dL — ABNORMAL HIGH (ref 70–99)

## 2024-03-05 LAB — CBC
HCT: 34.2 % — ABNORMAL LOW (ref 39.0–52.0)
Hemoglobin: 10.1 g/dL — ABNORMAL LOW (ref 13.0–17.0)
MCH: 21.8 pg — ABNORMAL LOW (ref 26.0–34.0)
MCHC: 29.5 g/dL — ABNORMAL LOW (ref 30.0–36.0)
MCV: 73.7 fL — ABNORMAL LOW (ref 80.0–100.0)
Platelets: 368 K/uL (ref 150–400)
RBC: 4.64 MIL/uL (ref 4.22–5.81)
RDW: 17.5 % — ABNORMAL HIGH (ref 11.5–15.5)
WBC: 11.6 K/uL — ABNORMAL HIGH (ref 4.0–10.5)
nRBC: 0 % (ref 0.0–0.2)

## 2024-03-05 LAB — COMPREHENSIVE METABOLIC PANEL WITH GFR
ALT: 21 U/L (ref 0–44)
AST: 17 U/L (ref 15–41)
Albumin: 2.9 g/dL — ABNORMAL LOW (ref 3.5–5.0)
Alkaline Phosphatase: 84 U/L (ref 38–126)
Anion gap: 14 (ref 5–15)
BUN: 35 mg/dL — ABNORMAL HIGH (ref 8–23)
CO2: 22 mmol/L (ref 22–32)
Calcium: 8.5 mg/dL — ABNORMAL LOW (ref 8.9–10.3)
Chloride: 102 mmol/L (ref 98–111)
Creatinine, Ser: 1.7 mg/dL — ABNORMAL HIGH (ref 0.61–1.24)
GFR, Estimated: 42 mL/min — ABNORMAL LOW (ref >60)
Glucose, Bld: 181 mg/dL — ABNORMAL HIGH (ref 70–99)
Potassium: 3.3 mmol/L — ABNORMAL LOW (ref 3.5–5.1)
Sodium: 138 mmol/L (ref 135–145)
Total Bilirubin: 0.6 mg/dL (ref 0.0–1.2)
Total Protein: 6.8 g/dL (ref 6.5–8.1)

## 2024-03-05 LAB — MAGNESIUM: Magnesium: 1.8 mg/dL (ref 1.7–2.4)

## 2024-03-05 MED ORDER — MAGNESIUM SULFATE 2 GM/50ML IV SOLN
2.0000 g | Freq: Once | INTRAVENOUS | Status: AC
  Administered 2024-03-05: 2 g via INTRAVENOUS
  Filled 2024-03-05: qty 50

## 2024-03-05 MED ORDER — POTASSIUM CHLORIDE CRYS ER 20 MEQ PO TBCR
40.0000 meq | EXTENDED_RELEASE_TABLET | ORAL | Status: AC
Start: 2024-03-05 — End: 2024-03-05
  Administered 2024-03-05 (×2): 40 meq via ORAL
  Filled 2024-03-05 (×2): qty 2

## 2024-03-05 NOTE — Progress Notes (Addendum)
 Progress Note   Patient: Mike Hunter FMW:[id number] DOB: 05-29-1951 DOA: 02/25/2024     8 DOS: the patient was seen and examined on 03/05/2024   Brief hospital course: 73 year old male with paroxysmal atrial fibrillation on Xarelto , chronic HFpEF, COPD, CKD stage IIIb diabetes mellitus type 2, HTN, osteoporosis, CAD status post PCI, obesity, OSA on CPAP presented to ED with palpitations for 1 week.  He also reported chest pain, pressure with radiation down the left arm, associated shortness of breath, prior to presenting to ED, heart rate was in 130s In ED, patient was found to have heart rate 131, BP 105/53 and was placed on Cardizem  drip cardiology was consulted.  Assessment and Plan: Principal Problem:   Atrial fibrillation with rapid ventricular response (HCC) - Initially placed on Cardizem  drip  - Torsemide , Aldactone , metoprolol , Imdur  were held due to borderline BP - 2D echo showed EF of 45 to 50%, global hypokinesis, diastolic parameters indeterminate -  DCCV on 9/12 but had a reccurrence of AF postprocedure on 9/13, started on amiodarone  drip by cardiology - EP cardiology consulted, continued amiodarone  - Pt now failed attempt at DCCV 9/19 -cont with anticoag with beta blocker, amiodarone    Wide-complex tachycardia, hemodynamically unstable, chest pain - On 9/13, patient had sudden onset of V. tach, rate 180s to 190s, seen by cardiology urgently and was given IV amiodarone  bolus x2, placed on amiodarone  drip - EP cardiology following, now s/p ICD on 9/16   Acute on chronic systolic and diastolic heart failure with volume overload - Noted to be in volume overload on 9/13, torsemide  and Aldactone  had been held on the admission due to borderline BP - Was on IV lasix , now on torsemide  BID per Cardiology   CAD status post PCI,  hypertension -Previous echo 12/2023 had shown EF of 55 to 60%, indeterminate left ventricular diastolic parameters, global hypokinesis, moderate asymmetric LVH  of the septal segment.  Mild to moderate aortic stenosis - Continue Plavix , cardiology following     COPD with mild exacerbation Leukocytosis, acute bronchitis -Improving, continue Pulmicort , Brovana , Xopenex  nebs -Troponin 0.71 -Placed on IV Unasyn , blood cultures NTD, leukocytosis resolved  -Anticipate completing abx 9/20       Insulin -requiring or dependent type II diabetes mellitus (HCC), uncontrolled with hyperglycemia  -On high doses of insulin  outpatient, last A1c 6.7   CBG (last 3)  Recent Labs (last 2 labs)       Recent Labs    02/29/24 1602 02/29/24 2132 03/01/24 0606  GLUCAP 272* 227* 202*      -CBG still uncontrolled, increased Lantus  to 40 units daily, NovoLog  8 units 3 times daily AC, moderate SSI -Diabetic coordinator consult     Anemia due to stage 3b chronic kidney disease (HCC) -H&H currently stable.  Baseline hemoglobin 9-10   Mild acute on CKD stage IIIb - Baseline creatinine 1.4-1.7 likely worsened due to A-fib with RVR, now on diuresis  - Creatinine stable at 1.70   Hyperlipidemia - Resume statin    Obesity class II Estimated body mass index is 39.48 kg/m as calculated from the following:   Height as of this encounter: 5' 10 (1.778 m).   Weight as of this encounter: 124.8 kg.       Subjective: Without complaints. Agreeable to SNF  Physical Exam: Vitals:   03/05/24 0700 03/05/24 0739 03/05/24 0839 03/05/24 1128  BP:  111/69 107/79 112/83  Pulse:  (!) 109  99  Resp: 19 (!) 21  20  Temp:  98 F (36.7 C) 98 F (36.7 C) 98 F (36.7 C)  TempSrc:  Oral Oral Oral  SpO2:  99%  100%  Weight:      Height:       General exam: Conversant, in no acute distress Respiratory system: normal chest rise, clear, no audible wheezing Cardiovascular system: regular rhythm, s1-s2 Gastrointestinal system: Nondistended, nontender, pos BS Central nervous system: No seizures, no tremors Extremities: No cyanosis, no joint deformities Skin: No rashes, no  pallor Psychiatry: Affect normal // no auditory hallucinations   Data Reviewed:  Labs reviewed: Na 138, K 3.3, Cr 1.70, WBC 11.6, Hgb 10.1, Plts 368  Family Communication: Pt in room, family not at bedside  Disposition: Status is: Inpatient Remains inpatient appropriate because: severity of illness  Planned Discharge Destination: Skilled nursing facility     Author: Garnette Pelt, MD 03/05/2024 3:13 PM  For on call review [email or web address].

## 2024-03-05 NOTE — Progress Notes (Signed)
 Progress Note  Patient Name: Mike Hunter Date of Encounter: 03/05/2024 Primary Cardiologist: New to Dr. Santo   Subjective   AF with PVCs today.  He is very worried that he is going to die from his atrial fibrillation.  Vital Signs    Vitals:   03/05/24 0600 03/05/24 0700 03/05/24 0739 03/05/24 0839  BP:   111/69 107/79  Pulse:   (!) 109   Resp: 16 19 (!) 21   Temp:   98 F (36.7 C) 98 F (36.7 C)  TempSrc:   Oral Oral  SpO2:   99%   Weight:      Height:        Intake/Output Summary (Last 24 hours) at 03/05/2024 1005 Last data filed at 03/05/2024 0839 Gross per 24 hour  Intake 610 ml  Output 2625 ml  Net -2015 ml   Filed Weights   02/29/24 0447 03/01/24 0023 03/05/24 0400  Weight: 125.2 kg 124.8 kg 118.9 kg    Physical Exam   GEN: No acute distress.   Neck: No JVD  Cardiac: IRIR rate controlled no rubs, or gallops. Systolic murmur Respiratory: CTAB GI: Soft, nontender, distended with fluid wave improving MS: No edema  Labs   Telemetry: AF rates ~ 110 bpm   Chemistry Recent Labs  Lab 03/03/24 0240 03/04/24 0235 03/05/24 0249  NA 139 140 138  K 3.6 3.5 3.3*  CL 100 99 102  CO2 22 23 22   GLUCOSE 196* 318* 181*  BUN 31* 33* 35*  CREATININE 1.74* 1.49* 1.70*  CALCIUM  8.6* 8.7* 8.5*  PROT 6.7 6.7 6.8  ALBUMIN  2.7* 2.7* 2.9*  AST 16 14* 17  ALT 17 19 21   ALKPHOS 79 86 84  BILITOT 0.6 0.6 0.6  GFRNONAA 40* 49* 42*  ANIONGAP 17* 18* 14     Hematology Recent Labs  Lab 03/03/24 0240 03/04/24 0235 03/05/24 0249  WBC 10.3 9.8 11.6*  RBC 4.54 4.69 4.64  HGB 10.0* 10.2* 10.1*  HCT 33.4* 34.7* 34.2*  MCV 73.6* 74.0* 73.7*  MCH 22.0* 21.7* 21.8*  MCHC 29.9* 29.4* 29.5*  RDW 17.6* 17.5* 17.5*  PLT 318 319 368   BNP Recent Labs  Lab 02/27/24 1103  BNP 222.0*     Cardiac Studies   Cardiac Studies & Procedures   ______________________________________________________________________________________________      ECHOCARDIOGRAM  ECHOCARDIOGRAM COMPLETE 02/26/2024  Narrative ECHOCARDIOGRAM REPORT    Patient Name:   Mike Hunter Date of Exam: 02/26/2024 Medical Rec #:  [id number]     Height:       70.0 in Accession #:    [id number]    Weight:       275.1 lb Date of Birth:  [birth year]      BSA:          2.390 m Patient Age:    73 years      BP:           111/75 mmHg Patient Gender: M             HR:           87 bpm. Exam Location:  Inpatient  Procedure: 2D Echo and Intracardiac Opacification Agent (Both Spectral and Color Flow Doppler were utilized during procedure).  Indications:     Dyspnea  History:         Patient has prior history of Echocardiogram examinations. Aortic Valve Disease, Arrythmias:Atrial Fibrillation; Signs/Symptoms:Dyspnea.  Sonographer:     Charmaine Gaskins Referring Phys:  [id number] SARA-MAIZ A THOMAS Diagnosing Phys: Lonni Nanas MD  IMPRESSIONS   1. Left ventricular ejection fraction, by estimation, is 45 to 50%. The left ventricle has mildly decreased function. The left ventricle demonstrates global hypokinesis. The left ventricular internal cavity size was mildly dilated. Left ventricular diastolic parameters are indeterminate. 2. Right ventricular systolic function is normal. The right ventricular size is normal. There is mildly elevated pulmonary artery systolic pressure. The estimated right ventricular systolic pressure is 37.8 mmHg. 3. Left atrial size was mildly dilated. 4. The mitral valve is normal in structure. Mild mitral valve regurgitation. 5. The aortic valve is calcified. Aortic valve regurgitation is not visualized. Mild to moderate aortic valve stenosis. Vmax 2.8 m/s, MG 21 mmHg, AVA 1.6 cm^2, DI 0.45 6. The inferior vena cava is normal in size with greater than 50% respiratory variability, suggesting right atrial pressure of 3 mmHg.  FINDINGS Left Ventricle: Left ventricular ejection fraction, by estimation, is 45 to 50%. The left ventricle  has mildly decreased function. The left ventricle demonstrates global hypokinesis. Definity  contrast agent was given IV to delineate the left ventricular endocardial borders. The left ventricular internal cavity size was mildly dilated. There is no left ventricular hypertrophy. Left ventricular diastolic parameters are indeterminate.  Right Ventricle: The right ventricular size is normal. No increase in right ventricular wall thickness. Right ventricular systolic function is normal. There is mildly elevated pulmonary artery systolic pressure. The tricuspid regurgitant velocity is 2.95 m/s, and with an assumed right atrial pressure of 3 mmHg, the estimated right ventricular systolic pressure is 37.8 mmHg.  Left Atrium: Left atrial size was mildly dilated.  Right Atrium: Right atrial size was normal in size.  Pericardium: Trivial pericardial effusion is present.  Mitral Valve: The mitral valve is normal in structure. Mild mitral valve regurgitation.  Tricuspid Valve: The tricuspid valve is normal in structure. Tricuspid valve regurgitation is mild.  Aortic Valve: The aortic valve is calcified. Aortic valve regurgitation is not visualized. Mild to moderate aortic stenosis is present. Aortic valve mean gradient measures 18.6 mmHg. Aortic valve peak gradient measures 27.5 mmHg. Aortic valve area, by VTI measures 1.39 cm.  Pulmonic Valve: The pulmonic valve was not well visualized. Pulmonic valve regurgitation is not visualized.  Aorta: The aortic root and ascending aorta are structurally normal, with no evidence of dilitation.  Venous: The inferior vena cava is normal in size with greater than 50% respiratory variability, suggesting right atrial pressure of 3 mmHg.  IAS/Shunts: The atrial septum is grossly normal.   LEFT VENTRICLE PLAX 2D LVIDd:         6.00 cm      Diastology LVIDs:         4.60 cm      LV e' medial:    7.87 cm/s LV PW:         0.90 cm      LV E/e' medial:  14.2 LV IVS:         1.00 cm      LV e' lateral:   8.01 cm/s LVOT diam:     2.20 cm      LV E/e' lateral: 13.9 LV SV:         77 LV SV Index:   32 LVOT Area:     3.80 cm  LV Volumes (MOD) LV vol d, MOD A2C: 122.0 ml LV vol d, MOD A4C: 121.0 ml LV vol s, MOD A2C: 43.7 ml LV vol s, MOD A4C: 76.9 ml LV SV  MOD A2C:     78.3 ml LV SV MOD A4C:     121.0 ml LV SV MOD BP:      64.1 ml  RIGHT VENTRICLE RV Basal diam:  3.50 cm RV Mid diam:    3.60 cm RV S prime:     11.20 cm/s  LEFT ATRIUM             Index        RIGHT ATRIUM           Index LA diam:        4.80 cm 2.01 cm/m   RA Area:     19.10 cm LA Vol (A2C):   89.8 ml 37.57 ml/m  RA Volume:   47.80 ml  20.00 ml/m LA Vol (A4C):   87.9 ml 36.77 ml/m LA Biplane Vol: 91.9 ml 38.45 ml/m AORTIC VALVE AV Area (Vmax):    1.52 cm AV Area (Vmean):   1.39 cm AV Area (VTI):     1.39 cm AV Vmax:           262.40 cm/s AV Vmean:          209.000 cm/s AV VTI:            0.554 m AV Peak Grad:      27.5 mmHg AV Mean Grad:      18.6 mmHg LVOT Vmax:         104.92 cm/s LVOT Vmean:        76.200 cm/s LVOT VTI:          0.202 m LVOT/AV VTI ratio: 0.36  AORTA Ao Asc diam: 3.40 cm  MITRAL VALVE                TRICUSPID VALVE MV Area (PHT): 4.70 cm     TR Peak grad:   34.8 mmHg MV Decel Time: 161 msec     TR Vmax:        295.00 cm/s MV E velocity: 111.67 cm/s SHUNTS Systemic VTI:  0.20 m Systemic Diam: 2.20 cm  Lonni Nanas MD Electronically signed by Lonni Nanas MD Signature Date/Time: 02/26/2024/2:34:07 PM    Final (Updated)   TEE  ECHO TEE 03/04/2024  Narrative TRANSESOPHOGEAL ECHO REPORT    Patient Name:   Mike Hunter Date of Exam: 03/04/2024 Medical Rec #:  [id number]     Height:       70.0 in Accession #:    [id number]    Weight:       275.1 lb Date of Birth:  [birth year]      BSA:          2.390 m Patient Age:    73 years      BP:           102/70 mmHg Patient Gender: M             HR:           103  bpm. Exam Location:  Inpatient  Procedure: Color Doppler, Cardiac Doppler and Transesophageal Echo (Both Spectral and Color Flow Doppler were utilized during procedure).  Indications:    Cardioversion  History:        Patient has prior history of Echocardiogram examinations, most recent 02/26/2024.  Sonographer:    Tinnie Gosling RDCS Referring Phys: [phone] MIHAI CROITORU  PROCEDURE: After discussion of the risks and benefits of a TEE, an informed consent was obtained. The transesophogeal probe was passed without difficulty through the esophogus  of the patient. Sedation performed by different physician. The patient was monitored while under deep sedation. Anesthestetic sedation was provided intravenously by Anesthesiology: 159mg  of Propofol , 60mg  of Lidocaine . The patient developed no complications during the procedure.  IMPRESSIONS   1. Left ventricular ejection fraction, by estimation, is 35 to 40%. The left ventricle has moderately decreased function. The left ventricle demonstrates global hypokinesis. Left ventricular diastolic function could not be evaluated. 2. Right ventricular systolic function is mildly reduced. The right ventricular size is normal. The estimated right ventricular systolic pressure is 35.0 mmHg. 3. Left atrial size was moderately dilated. No left atrial/left atrial appendage thrombus was detected. The LAA emptying velocity was 23 cm/s. 4. Right atrial size was mildly dilated. 5. The mitral valve is normal in structure. Trivial mitral valve regurgitation. No evidence of mitral stenosis. 6. Possibly a functionally bicuspid aortic valve. There is a calcified raphe between the left and noncoronary cusps. The aortic valve is abnormal. There is mild calcification of the aortic valve. There is mild thickening of the aortic valve. Aortic valve regurgitation is not visualized. Aortic valve sclerosis/calcification is present, without any evidence of aortic stenosis. 7. There is  Moderate (Grade III) plaque involving the descending aorta.  FINDINGS Left Ventricle: Left ventricular ejection fraction, by estimation, is 35 to 40%. The left ventricle has moderately decreased function. The left ventricle demonstrates global hypokinesis. The left ventricular internal cavity size was normal in size. There is no left ventricular hypertrophy. Left ventricular diastolic function could not be evaluated due to atrial fibrillation. Left ventricular diastolic function could not be evaluated.   LV Wall Scoring: The posterior wall and basal inferior segment are severely hypokinetic, on a background of mild global hypokinesis.  Right Ventricle: The right ventricular size is normal. No increase in right ventricular wall thickness. Right ventricular systolic function is mildly reduced. The tricuspid regurgitant velocity is 2.60 m/s, and with an assumed right atrial pressure of 8 mmHg, the estimated right ventricular systolic pressure is 35.0 mmHg.  Left Atrium: Left atrial size was moderately dilated. No left atrial/left atrial appendage thrombus was detected. The LAA emptying velocity was 23 cm/s.  Right Atrium: Right atrial size was mildly dilated.  Pericardium: There is no evidence of pericardial effusion.  Mitral Valve: The mitral valve is normal in structure. Trivial mitral valve regurgitation. No evidence of mitral valve stenosis.  Tricuspid Valve: The tricuspid valve is normal in structure. Tricuspid valve regurgitation is trivial.  Aortic Valve: Possibly a functionally bicuspid aortic valve. There is a calcified raphe between the left and noncoronary cusps. The aortic valve is abnormal. There is mild calcification of the aortic valve. There is mild thickening of the aortic valve. Aortic valve regurgitation is not visualized. Aortic valve sclerosis/calcification is present, without any evidence of aortic stenosis.  Pulmonic Valve: The pulmonic valve was grossly normal.  Pulmonic valve regurgitation is not visualized. No evidence of pulmonic stenosis.  Aorta: The aortic root, ascending aorta, aortic arch and descending aorta are all structurally normal, with no evidence of dilitation or obstruction. There is moderate (Grade III) plaque involving the descending aorta.  IAS/Shunts: No atrial level shunt detected by color flow Doppler.  Additional Comments: A device lead is visualized in the right ventricle.  TRICUSPID VALVE TR Peak grad:   27.0 mmHg TR Vmax:        260.00 cm/s  Jerel Balding MD Electronically signed by Jerel Balding MD Signature Date/Time: 03/04/2024/3:37:26 PM    Final  CARDIAC MRI  MR CARDIAC MORPHOLOGY W WO CONTRAST 02/29/2024  Narrative CLINICAL DATA:  Clinical question of scar formation Study assumes HCT of 32 and BSA of 2.49 m2.  EXAM: CARDIAC MRI  TECHNIQUE: The patient was scanned on a 1.5 Tesla GE magnet. A dedicated cardiac coil was used. Functional imaging was done using Fiesta sequences. 2,3, and 4 chamber views were done to assess for RWMA's. Modified Simpson's rule using was used to calculate an ejection fraction on a dedicated work [employer], [employer]. The patient received 10 cc of Gadavist . After 10 minutes inversion recovery sequences were used to assess for infiltration and scar tissue. Flow quantification was performed 2 times during this examination with flow quantification performed at the levels of the ascending aorta above the valve, pulmonary artery above the valve.  CONTRAST:  10 cc  of Gadavist   FINDINGS: 1. Normal left ventricular size, with LVEDD 55 mm, and LVEDVi 59 mL/m2.  Normal left ventricular thickness.  Moderate decrease ventricular systolic function (LVEF =35%). Patient is free breathing and in a irregular rapid rhythm. Grossly suggestive of global hypokinesis. Unable to perform accurate strain.  Left ventricular parametric mapping notable for elevated native  T1 in the basal lateral (1372 ms), poor post contrast T1 comparison for ECV, and increased T2 in the basal lateral (59 ms).  There is late gadolinium enhancement in the left ventricular myocardium, that is subendocardial, in the inferior lateral wall. This accounts for 75% of the wall.  Cannot exclude abnormal perfusion in the mid inferolateral.  2. Normal right ventricular size with RVEDVI 53 mL/m2.  Normal right ventricular thickness.  Moderate decrease in the right ventricular systolic function (RVEF =31%). There are no regional wall motion abnormalities or aneurysms but global hypokinesis.  3.  Normal right atrial size with left atrial enlargement.  4. Normal size of the aortic root, ascending aorta. Moderate dilation of the main pulmonary artery, 31 mm.  5. Valve assessment:  Aortic Valve: Tri-leaflet and calcified valve. Mean gradient 4 mm Hg using anti-aliasing. Regurgitant fraction of 21% may overestimate regurgitation in the setting of RVR acquisition.  Pulmonic Valve: Mild regurgitation, regurgitant fraction of 8% may overestimate regurgitation in the setting of RVR acquisition.  Tricuspid Valve: Sub-optimal view of valve morphology. Qualitatively mild, central regurgitation.  Mitral Valve: Mild central regurgitation. Regurgitant fraction of 13%.  6. Normal pericardium. Small basal lateral pericardial effusion without evidence of tamponade physiology.  7. Grossly, bilateral pleural effusion noted but no extracardiac findings. Recommended dedicated study if concerned for non-cardiac pathology.  8. Difficult acquisition in the setting of free breathing and AF RVR.  IMPRESSION: 1. LVEF 35% in the setting of AF RVR. 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 In correlation with lab testing showing no acute infarction this admission, this is less likely a viable territory.  2.  RVEF 31% in the setting of AF RVR.  3.   Bilateral pleural effusions incidentally noted.  Stanly Leavens MD   Electronically Signed By: Stanly Leavens M.D. 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 (basal inferolateral sub-endocardial) - managed by EP: on amiodarone  PO with prolonged IV load and metoprolol  succinate  Persistent AF Query of AFL today - S/p 9/125/25  -  s/p failed 03/04/24 DCCV; EP f/u as outpatient for consideration of Ablation or repeat DCCV - AC and BB  Acute on Chronic HF - NYHA I, hypervolemic and much improved - doing well on torsemide  40 mg PO BID; creatinine is stable - continue MRA (25 mg PO Daily) - BB as above - outpatient SGLT2i (high risk of yeast infection) - low dose ARB; if BP elevates given low K today can increase dose; this admission has had low BP  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 - BMI is 30+  OSA and DM - would meet criteria for GLP-1 based therapy - discussed protein intake, 25 g fiber intake, small meals, and decreasing high fats to minimize symptom burden - after outpatient f/u visit, would recommend Pharm D eval for consideration  Unable to support himself at home and wife also needs support, planned for SNF  GOC - we discussed his full goals of care at length, in the context of his daughter who works in Dispensing optician and family who works in Systems developer homes; I do not suspect him to pass in 2025   For questions or  updates, please contact [facility] Please consult [email or web address] for contact info under Cardiology/STEMI.      Stanly Leavens, MD FASE [facility] Cardiologist [facility]  [address] [place], [address] [place], [place] [address] [phone]  10:05 AM

## 2024-03-05 NOTE — Plan of Care (Signed)
   Problem: Education: Goal: Knowledge of General Education information will improve Description Including pain rating scale, medication(s)/side effects and non-pharmacologic comfort measures Outcome: Progressing

## 2024-03-05 NOTE — Progress Notes (Addendum)
 Mobility Specialist Progress Note:    03/05/24 1143  Mobility  Activity Ambulated with assistance  Level of Assistance Minimal assist, patient does 75% or more  Assistive Device Front wheel walker  Distance Ambulated (ft) 40 ft (x2)  Activity Response Tolerated well  Mobility Referral Yes  Mobility visit 1 Mobility  Mobility Specialist Start Time (ACUTE ONLY) [id number]  Mobility Specialist Stop Time (ACUTE ONLY) 0940  Mobility Specialist Time Calculation (min) (ACUTE ONLY) 15 min   Pt received in bed agreeable to mobility. No physical assistance require. Took x1 standing rest break d/t fatigue otherwise tolerated well. Returned to room w/o fault. Left in bed w/ call bell and personal belongings in reach. All needs met.   During Mobility: HR 106-113  Mike Hunter Mobility Specialist  Please contact vis Secure Chat or  Rehab Office [phone]

## 2024-03-05 NOTE — Plan of Care (Signed)
 Problem: Education: Goal: Knowledge of General Education information will improve Description: Including pain rating scale, medication(s)/side effects and non-pharmacologic comfort measures Outcome: Progressing   Problem: Health Behavior/Discharge Planning: Goal: Ability to manage health-related needs will improve Outcome: Progressing   Problem: Clinical Measurements: Goal: Ability to maintain clinical measurements within normal limits will improve Outcome: Progressing Goal: Will remain free from infection Outcome: Progressing Goal: Diagnostic test results will improve Outcome: Progressing Goal: Respiratory complications will improve Outcome: Progressing Goal: Cardiovascular complication will be avoided Outcome: Progressing   Problem: Activity: Goal: Risk for activity intolerance will decrease Outcome: Progressing   Problem: Nutrition: Goal: Adequate nutrition will be maintained Outcome: Progressing   Problem: Coping: Goal: Level of anxiety will decrease Outcome: Progressing   Problem: Elimination: Goal: Will not experience complications related to bowel motility Outcome: Progressing Goal: Will not experience complications related to urinary retention Outcome: Progressing   Problem: Pain Managment: Goal: General experience of comfort will improve and/or be controlled Outcome: Progressing   Problem: Safety: Goal: Ability to remain free from injury will improve Outcome: Progressing   Problem: Skin Integrity: Goal: Risk for impaired skin integrity will decrease Outcome: Progressing   Problem: Education: Goal: Knowledge of disease or condition will improve Outcome: Progressing Goal: Understanding of medication regimen will improve Outcome: Progressing Goal: Individualized Educational Video(s) Outcome: Progressing   Problem: Activity: Goal: Ability to tolerate increased activity will improve Outcome: Progressing   Problem: Health Behavior/Discharge  Planning: Goal: Ability to safely manage health-related needs after discharge will improve Outcome: Progressing   Problem: Education: Goal: Ability to describe self-care measures that may prevent or decrease complications (Diabetes Survival Skills Education) will improve Outcome: Progressing Goal: Individualized Educational Video(s) Outcome: Progressing   Problem: Coping: Goal: Ability to adjust to condition or change in health will improve Outcome: Progressing   Problem: Fluid Volume: Goal: Ability to maintain a balanced intake and output will improve Outcome: Progressing   Problem: Health Behavior/Discharge Planning: Goal: Ability to identify and utilize available resources and services will improve Outcome: Progressing Goal: Ability to manage health-related needs will improve Outcome: Progressing   Problem: Metabolic: Goal: Ability to maintain appropriate glucose levels will improve Outcome: Progressing   Problem: Nutritional: Goal: Maintenance of adequate nutrition will improve Outcome: Progressing Goal: Progress toward achieving an optimal weight will improve Outcome: Progressing   Problem: Skin Integrity: Goal: Risk for impaired skin integrity will decrease Outcome: Progressing   Problem: Tissue Perfusion: Goal: Adequacy of tissue perfusion will improve Outcome: Progressing   Problem: Education: Goal: Ability to describe self-care measures that may prevent or decrease complications (Diabetes Survival Skills Education) will improve Outcome: Progressing Goal: Individualized Educational Video(s) Outcome: Progressing   Problem: Coping: Goal: Ability to adjust to condition or change in health will improve Outcome: Progressing   Problem: Fluid Volume: Goal: Ability to maintain a balanced intake and output will improve Outcome: Progressing   Problem: Health Behavior/Discharge Planning: Goal: Ability to identify and utilize available resources and services will  improve Outcome: Progressing Goal: Ability to manage health-related needs will improve Outcome: Progressing   Problem: Metabolic: Goal: Ability to maintain appropriate glucose levels will improve Outcome: Progressing   Problem: Nutritional: Goal: Maintenance of adequate nutrition will improve Outcome: Progressing Goal: Progress toward achieving an optimal weight will improve Outcome: Progressing   Problem: Skin Integrity: Goal: Risk for impaired skin integrity will decrease Outcome: Progressing   Problem: Tissue Perfusion: Goal: Adequacy of tissue perfusion will improve Outcome: Progressing   Problem: Education: Goal: Knowledge of cardiac device and  self-care will improve Outcome: Progressing Goal: Ability to safely manage health related needs after discharge will improve Outcome: Progressing Goal: Individualized Educational Video(s) Outcome: Progressing   Problem: Cardiac: Goal: Ability to achieve and maintain adequate cardiopulmonary perfusion will improve Outcome: Not Progressing

## 2024-03-06 ENCOUNTER — Encounter (HOSPITAL_COMMUNITY): Payer: Self-pay | Admitting: Cardiovascular Disease

## 2024-03-06 DIAGNOSIS — R079 Chest pain, unspecified: Secondary | ICD-10-CM | POA: Diagnosis not present

## 2024-03-06 DIAGNOSIS — I5020 Unspecified systolic (congestive) heart failure: Secondary | ICD-10-CM | POA: Diagnosis not present

## 2024-03-06 DIAGNOSIS — I4891 Unspecified atrial fibrillation: Secondary | ICD-10-CM | POA: Diagnosis not present

## 2024-03-06 DIAGNOSIS — I4892 Unspecified atrial flutter: Secondary | ICD-10-CM | POA: Diagnosis not present

## 2024-03-06 LAB — MAGNESIUM: Magnesium: 2.3 mg/dL (ref 1.7–2.4)

## 2024-03-06 LAB — COMPREHENSIVE METABOLIC PANEL WITH GFR
ALT: 21 U/L (ref 0–44)
AST: 20 U/L (ref 15–41)
Albumin: 2.8 g/dL — ABNORMAL LOW (ref 3.5–5.0)
Alkaline Phosphatase: 85 U/L (ref 38–126)
Anion gap: 9 (ref 5–15)
BUN: 39 mg/dL — ABNORMAL HIGH (ref 8–23)
CO2: 23 mmol/L (ref 22–32)
Calcium: 8.5 mg/dL — ABNORMAL LOW (ref 8.9–10.3)
Chloride: 105 mmol/L (ref 98–111)
Creatinine, Ser: 1.84 mg/dL — ABNORMAL HIGH (ref 0.61–1.24)
GFR, Estimated: 38 mL/min — ABNORMAL LOW (ref >60)
Glucose, Bld: 249 mg/dL — ABNORMAL HIGH (ref 70–99)
Potassium: 3.6 mmol/L (ref 3.5–5.1)
Sodium: 137 mmol/L (ref 135–145)
Total Bilirubin: 0.5 mg/dL (ref 0.0–1.2)
Total Protein: 6.8 g/dL (ref 6.5–8.1)

## 2024-03-06 LAB — GLUCOSE, CAPILLARY
Glucose-Capillary: 221 mg/dL — ABNORMAL HIGH (ref 70–99)
Glucose-Capillary: 253 mg/dL — ABNORMAL HIGH (ref 70–99)
Glucose-Capillary: 169 mg/dL — ABNORMAL HIGH (ref 70–99)
Glucose-Capillary: 160 mg/dL — ABNORMAL HIGH (ref 70–99)

## 2024-03-06 MED ORDER — INSULIN GLARGINE 100 UNIT/ML ~~LOC~~ SOLN
45.0000 [IU] | Freq: Every day | SUBCUTANEOUS | Status: DC
  Administered 2024-03-07 – 2024-03-08 (×2): 45 [IU] via SUBCUTANEOUS
  Filled 2024-03-06 (×3): qty 0.45

## 2024-03-06 MED ORDER — TORSEMIDE 20 MG PO TABS
20.0000 mg | ORAL_TABLET | Freq: Every day | ORAL | Status: DC
  Administered 2024-03-06 – 2024-03-09 (×4): 20 mg via ORAL
  Filled 2024-03-06 (×3): qty 1

## 2024-03-06 MED ORDER — POTASSIUM CHLORIDE CRYS ER 20 MEQ PO TBCR
60.0000 meq | EXTENDED_RELEASE_TABLET | Freq: Once | ORAL | Status: AC
  Administered 2024-03-06: 60 meq via ORAL
  Filled 2024-03-06: qty 3

## 2024-03-06 NOTE — TOC CM/SW Note (Signed)
 Pt recommended for SNF, pt accepting of rec. Pt states that his choices are [address], [facility], [address], [facility], and [facility]. Pt states he will not go to any other facility in [facility] but will consider other SNFs in [place]. Level 2 PASRR pending, docs uploaded. FL2 completed; SNF ref faxed; awaiting bed offers.

## 2024-03-06 NOTE — Progress Notes (Signed)
 Mobility Specialist Progress Note:    03/06/24 1420  Mobility  Activity Ambulated with assistance  Level of Assistance Minimal assist, patient does 75% or more  Assistive Device Front wheel walker  Distance Ambulated (ft) 80 ft (x3)  Activity Response Tolerated well  Mobility Referral Yes  Mobility visit 1 Mobility  Mobility Specialist Start Time (ACUTE ONLY) 1335  Mobility Specialist Stop Time (ACUTE ONLY) 1348  Mobility Specialist Time Calculation (min) (ACUTE ONLY) 13 min   Pt received in bed eager for mobility. MinA required for bed mobility contact guard during ambulation. Pt took x3 standing rest breaks d/t fatigue, otherwise tolerated well. Returned to room w/o fault. Left in bed w/ call bell and personal belongings in reach. All needs met. RN in room.  Thersia Minder Mobility Specialist  Please contact vis Secure Chat or  Rehab Office [phone]

## 2024-03-06 NOTE — Progress Notes (Signed)
 Progress Note   Patient: Mike Hunter FMW:[id number] DOB: Dec 11, 1950 DOA: 02/25/2024     9 DOS: the patient was seen and examined on 03/06/2024   Brief hospital course: 73 year old male with paroxysmal atrial fibrillation on Xarelto , chronic HFpEF, COPD, CKD stage IIIb diabetes mellitus type 2, HTN, osteoporosis, CAD status post PCI, obesity, OSA on CPAP presented to ED with palpitations for 1 week.  He also reported chest pain, pressure with radiation down the left arm, associated shortness of breath, prior to presenting to ED, heart rate was in 130s In ED, patient was found to have heart rate 131, BP 105/53 and was placed on Cardizem  drip cardiology was consulted.  Assessment and Plan: Principal Problem:   Atrial fibrillation with rapid ventricular response (HCC) - Initially placed on Cardizem  drip  - Torsemide , Aldactone , metoprolol , Imdur  were held due to borderline BP - 2D echo showed EF of 45 to 50%, global hypokinesis, diastolic parameters indeterminate -  DCCV on 9/12 but had a reccurrence of AF postprocedure on 9/13, started on amiodarone  drip by cardiology - EP cardiology consulted, continued amiodarone  - Pt now failed attempt at DCCV 9/19 -cont with anticoag with beta blocker, amiodarone  -Plan for SNF on d/c   Wide-complex tachycardia, hemodynamically unstable, chest pain - On 9/13, patient had sudden onset of V. tach, rate 180s to 190s, seen by cardiology urgently and was given IV amiodarone  bolus x2, placed on amiodarone  drip - EP cardiology following, now s/p ICD on 9/16   Acute on chronic systolic and diastolic heart failure with volume overload - Noted to be in volume overload on 9/13, torsemide  and Aldactone  had been held on the admission due to borderline BP - Was on IV lasix , now on torsemide  BID per Cardiology   CAD status post PCI,  hypertension -Previous echo 12/2023 had shown EF of 55 to 60%, indeterminate left ventricular diastolic parameters, global hypokinesis,  moderate asymmetric LVH of the septal segment.  Mild to moderate aortic stenosis - Continue Plavix , cardiology following     COPD with mild exacerbation Leukocytosis, acute bronchitis -Improving, continue Pulmicort , Brovana , Xopenex  nebs -Troponin 0.71 -Placed on IV Unasyn , blood cultures NTD, leukocytosis resolved  -Anticipate completing abx 9/20       Insulin -requiring or dependent type II diabetes mellitus (HCC), uncontrolled with hyperglycemia  -On high doses of insulin  outpatient, last A1c 6.7 -CBG still uncontrolled, increased Lantus  to 45 units daily, NovoLog  8 units 3 times daily AC, moderate SSI -Diabetic coordinator consult     Anemia due to stage 3b chronic kidney disease (HCC) -H&H currently stable   Mild acute on CKD stage IIIb - Baseline creatinine 1.4-1.7 likely worsened due to A-fib with RVR, now on diuresis  - Creatinine currently up to 1.8 -May have to hold next dose of torsemide  if Cr remains elevated   Hyperlipidemia - Resume statin    Obesity class II Estimated body mass index is 39.48 kg/m as calculated from the following:   Height as of this encounter: 5' 10 (1.778 m).   Weight as of this encounter: 124.8 kg.       Subjective: No complaints this AM  Physical Exam: Vitals:   03/05/24 2332 03/06/24 0445 03/06/24 0830 03/06/24 1000  BP: 107/69 104/79 94/60 111/82  Pulse: 91 86 89 96  Resp: 15 20 18    Temp: 97.9 F (36.6 C) 97.7 F (36.5 C) (!) 97.4 F (36.3 C)   TempSrc: Axillary Oral Oral   SpO2: 96% 99% 100%   Weight:  Height:       General exam: Awake, laying in bed, in nad Respiratory system: Normal respiratory effort, no wheezing Cardiovascular system: irregularly irregular, s1, s2 Gastrointestinal system: Soft, nondistended, positive BS Central nervous system: CN2-12 grossly intact, strength intact Extremities: Perfused, no clubbing Skin: Normal skin turgor, no notable skin lesions seen Psychiatry: Mood normal // no visual  hallucinations   Data Reviewed:  Labs reviewed: Na 137, K 3.6, Cr 1.84  Family Communication: Pt in room, family not at bedside  Disposition: Status is: Inpatient Remains inpatient appropriate because: severity of illness  Planned Discharge Destination: Skilled nursing facility     Author: Garnette Pelt, MD 03/06/2024 2:56 PM  For on call review [email or web address].

## 2024-03-06 NOTE — Progress Notes (Signed)
 Progress Note  Patient Name: Mike Hunter Date of Encounter: 03/06/2024 Primary Cardiologist: New to Dr. Santo   Subjective   In AFL today.  He feeling much better.  Working on Data processing manager.   Vital Signs    Vitals:   03/05/24 2239 03/05/24 2332 03/06/24 0445 03/06/24 0830  BP:  107/69 104/79 94/60  Pulse: 91 91 86 89  Resp: 18 15 20 18   Temp:  97.9 F (36.6 C) 97.7 F (36.5 C) (!) 97.4 F (36.3 C)  TempSrc:  Axillary Oral Oral  SpO2:  96% 99% 100%  Weight:      Height:        Intake/Output Summary (Last 24 hours) at 03/06/2024 0846 Last data filed at 03/06/2024 0449 Gross per 24 hour  Intake 480 ml  Output 3001 ml  Net -2521 ml   Filed Weights   02/29/24 0447 03/01/24 0023 03/05/24 0400  Weight: 125.2 kg 124.8 kg 118.9 kg    Physical Exam   GEN: No acute distress.   Neck: No JVD  Cardiac: IRIR rate controlled no rubs, or gallops. Systolic murmur Respiratory: CTAB GI: Soft, nontender, distended with fluid wave improving MS: No edema  Labs   Telemetry: AFL rates ~ 110 bpm   Chemistry Recent Labs  Lab 03/04/24 0235 03/05/24 0249 03/06/24 0228  NA 140 138 137  K 3.5 3.3* 3.6  CL 99 102 105  CO2 23 22 23   GLUCOSE 318* 181* 249*  BUN 33* 35* 39*  CREATININE 1.49* 1.70* 1.84*  CALCIUM  8.7* 8.5* 8.5*  PROT 6.7 6.8 6.8  ALBUMIN  2.7* 2.9* 2.8*  AST 14* 17 20  ALT 19 21 21   ALKPHOS 86 84 85  BILITOT 0.6 0.6 0.5  GFRNONAA 49* 42* 38*  ANIONGAP 18* 14 9     Hematology Recent Labs  Lab 03/03/24 0240 03/04/24 0235 03/05/24 0249  WBC 10.3 9.8 11.6*  RBC 4.54 4.69 4.64  HGB 10.0* 10.2* 10.1*  HCT 33.4* 34.7* 34.2*  MCV 73.6* 74.0* 73.7*  MCH 22.0* 21.7* 21.8*  MCHC 29.9* 29.4* 29.5*  RDW 17.6* 17.5* 17.5*  PLT 318 319 368      Cardiac Studies   Cardiac Studies & Procedures   ______________________________________________________________________________________________     ECHOCARDIOGRAM  ECHOCARDIOGRAM COMPLETE  02/26/2024  Narrative ECHOCARDIOGRAM REPORT    Patient Name:   Robley C Hiott Date of Exam: 02/26/2024 Medical Rec #:  [id number]     Height:       70.0 in Accession #:    [id number]    Weight:       275.1 lb Date of Birth:  [birth year]      BSA:          2.390 m Patient Age:    73 years      BP:           111/75 mmHg Patient Gender: M             HR:           87 bpm. Exam Location:  Inpatient  Procedure: 2D Echo and Intracardiac Opacification Agent (Both Spectral and Color Flow Doppler were utilized during procedure).  Indications:     Dyspnea  History:         Patient has prior history of Echocardiogram examinations. Aortic Valve Disease, Arrythmias:Atrial Fibrillation; Signs/Symptoms:Dyspnea.  Sonographer:     Charmaine Gaskins Referring Phys:  [id number] A THOMAS Diagnosing Phys: Lonni Nanas MD  IMPRESSIONS   1.  Left ventricular ejection fraction, by estimation, is 45 to 50%. The left ventricle has mildly decreased function. The left ventricle demonstrates global hypokinesis. The left ventricular internal cavity size was mildly dilated. Left ventricular diastolic parameters are indeterminate. 2. Right ventricular systolic function is normal. The right ventricular size is normal. There is mildly elevated pulmonary artery systolic pressure. The estimated right ventricular systolic pressure is 37.8 mmHg. 3. Left atrial size was mildly dilated. 4. The mitral valve is normal in structure. Mild mitral valve regurgitation. 5. The aortic valve is calcified. Aortic valve regurgitation is not visualized. Mild to moderate aortic valve stenosis. Vmax 2.8 m/s, MG 21 mmHg, AVA 1.6 cm^2, DI 0.45 6. The inferior vena cava is normal in size with greater than 50% respiratory variability, suggesting right atrial pressure of 3 mmHg.  FINDINGS Left Ventricle: Left ventricular ejection fraction, by estimation, is 45 to 50%. The left ventricle has mildly decreased function. The left  ventricle demonstrates global hypokinesis. Definity  contrast agent was given IV to delineate the left ventricular endocardial borders. The left ventricular internal cavity size was mildly dilated. There is no left ventricular hypertrophy. Left ventricular diastolic parameters are indeterminate.  Right Ventricle: The right ventricular size is normal. No increase in right ventricular wall thickness. Right ventricular systolic function is normal. There is mildly elevated pulmonary artery systolic pressure. The tricuspid regurgitant velocity is 2.95 m/s, and with an assumed right atrial pressure of 3 mmHg, the estimated right ventricular systolic pressure is 37.8 mmHg.  Left Atrium: Left atrial size was mildly dilated.  Right Atrium: Right atrial size was normal in size.  Pericardium: Trivial pericardial effusion is present.  Mitral Valve: The mitral valve is normal in structure. Mild mitral valve regurgitation.  Tricuspid Valve: The tricuspid valve is normal in structure. Tricuspid valve regurgitation is mild.  Aortic Valve: The aortic valve is calcified. Aortic valve regurgitation is not visualized. Mild to moderate aortic stenosis is present. Aortic valve mean gradient measures 18.6 mmHg. Aortic valve peak gradient measures 27.5 mmHg. Aortic valve area, by VTI measures 1.39 cm.  Pulmonic Valve: The pulmonic valve was not well visualized. Pulmonic valve regurgitation is not visualized.  Aorta: The aortic root and ascending aorta are structurally normal, with no evidence of dilitation.  Venous: The inferior vena cava is normal in size with greater than 50% respiratory variability, suggesting right atrial pressure of 3 mmHg.  IAS/Shunts: The atrial septum is grossly normal.   LEFT VENTRICLE PLAX 2D LVIDd:         6.00 cm      Diastology LVIDs:         4.60 cm      LV e' medial:    7.87 cm/s LV PW:         0.90 cm      LV E/e' medial:  14.2 LV IVS:        1.00 cm      LV e' lateral:    8.01 cm/s LVOT diam:     2.20 cm      LV E/e' lateral: 13.9 LV SV:         77 LV SV Index:   32 LVOT Area:     3.80 cm  LV Volumes (MOD) LV vol d, MOD A2C: 122.0 ml LV vol d, MOD A4C: 121.0 ml LV vol s, MOD A2C: 43.7 ml LV vol s, MOD A4C: 76.9 ml LV SV MOD A2C:     78.3 ml LV SV MOD A4C:  121.0 ml LV SV MOD BP:      64.1 ml  RIGHT VENTRICLE RV Basal diam:  3.50 cm RV Mid diam:    3.60 cm RV S prime:     11.20 cm/s  LEFT ATRIUM             Index        RIGHT ATRIUM           Index LA diam:        4.80 cm 2.01 cm/m   RA Area:     19.10 cm LA Vol (A2C):   89.8 ml 37.57 ml/m  RA Volume:   47.80 ml  20.00 ml/m LA Vol (A4C):   87.9 ml 36.77 ml/m LA Biplane Vol: 91.9 ml 38.45 ml/m AORTIC VALVE AV Area (Vmax):    1.52 cm AV Area (Vmean):   1.39 cm AV Area (VTI):     1.39 cm AV Vmax:           262.40 cm/s AV Vmean:          209.000 cm/s AV VTI:            0.554 m AV Peak Grad:      27.5 mmHg AV Mean Grad:      18.6 mmHg LVOT Vmax:         104.92 cm/s LVOT Vmean:        76.200 cm/s LVOT VTI:          0.202 m LVOT/AV VTI ratio: 0.36  AORTA Ao Asc diam: 3.40 cm  MITRAL VALVE                TRICUSPID VALVE MV Area (PHT): 4.70 cm     TR Peak grad:   34.8 mmHg MV Decel Time: 161 msec     TR Vmax:        295.00 cm/s MV E velocity: 111.67 cm/s SHUNTS Systemic VTI:  0.20 m Systemic Diam: 2.20 cm  Lonni Nanas MD Electronically signed by Lonni Nanas MD Signature Date/Time: 02/26/2024/2:34:07 PM    Final (Updated)   TEE  ECHO TEE 03/04/2024  Narrative TRANSESOPHOGEAL ECHO REPORT    Patient Name:   Tiquan C Wake Date of Exam: 03/04/2024 Medical Rec #:  [id number]     Height:       70.0 in Accession #:    [id number]    Weight:       275.1 lb Date of Birth:  Jul 19, 1950      BSA:          2.390 m Patient Age:    73 years      BP:           102/70 mmHg Patient Gender: M             HR:           103 bpm. Exam Location:  Inpatient  Procedure:  Color Doppler, Cardiac Doppler and Transesophageal Echo (Both Spectral and Color Flow Doppler were utilized during procedure).  Indications:    Cardioversion  History:        Patient has prior history of Echocardiogram examinations, most recent 02/26/2024.  Sonographer:    Tinnie Gosling RDCS Referring Phys: [id number] MIHAI CROITORU  PROCEDURE: After discussion of the risks and benefits of a TEE, an informed consent was obtained. The transesophogeal probe was passed without difficulty through the esophogus of the patient. Sedation performed by different physician. The patient was monitored while under deep sedation.  Anesthestetic sedation was provided intravenously by Anesthesiology: 159mg  of Propofol , 60mg  of Lidocaine . The patient developed no complications during the procedure.  IMPRESSIONS   1. Left ventricular ejection fraction, by estimation, is 35 to 40%. The left ventricle has moderately decreased function. The left ventricle demonstrates global hypokinesis. Left ventricular diastolic function could not be evaluated. 2. Right ventricular systolic function is mildly reduced. The right ventricular size is normal. The estimated right ventricular systolic pressure is 35.0 mmHg. 3. Left atrial size was moderately dilated. No left atrial/left atrial appendage thrombus was detected. The LAA emptying velocity was 23 cm/s. 4. Right atrial size was mildly dilated. 5. The mitral valve is normal in structure. Trivial mitral valve regurgitation. No evidence of mitral stenosis. 6. Possibly a functionally bicuspid aortic valve. There is a calcified raphe between the left and noncoronary cusps. The aortic valve is abnormal. There is mild calcification of the aortic valve. There is mild thickening of the aortic valve. Aortic valve regurgitation is not visualized. Aortic valve sclerosis/calcification is present, without any evidence of aortic stenosis. 7. There is Moderate (Grade III) plaque involving the  descending aorta.  FINDINGS Left Ventricle: Left ventricular ejection fraction, by estimation, is 35 to 40%. The left ventricle has moderately decreased function. The left ventricle demonstrates global hypokinesis. The left ventricular internal cavity size was normal in size. There is no left ventricular hypertrophy. Left ventricular diastolic function could not be evaluated due to atrial fibrillation. Left ventricular diastolic function could not be evaluated.   LV Wall Scoring: The posterior wall and basal inferior segment are severely hypokinetic, on a background of mild global hypokinesis.  Right Ventricle: The right ventricular size is normal. No increase in right ventricular wall thickness. Right ventricular systolic function is mildly reduced. The tricuspid regurgitant velocity is 2.60 m/s, and with an assumed right atrial pressure of 8 mmHg, the estimated right ventricular systolic pressure is 35.0 mmHg.  Left Atrium: Left atrial size was moderately dilated. No left atrial/left atrial appendage thrombus was detected. The LAA emptying velocity was 23 cm/s.  Right Atrium: Right atrial size was mildly dilated.  Pericardium: There is no evidence of pericardial effusion.  Mitral Valve: The mitral valve is normal in structure. Trivial mitral valve regurgitation. No evidence of mitral valve stenosis.  Tricuspid Valve: The tricuspid valve is normal in structure. Tricuspid valve regurgitation is trivial.  Aortic Valve: Possibly a functionally bicuspid aortic valve. There is a calcified raphe between the left and noncoronary cusps. The aortic valve is abnormal. There is mild calcification of the aortic valve. There is mild thickening of the aortic valve. Aortic valve regurgitation is not visualized. Aortic valve sclerosis/calcification is present, without any evidence of aortic stenosis.  Pulmonic Valve: The pulmonic valve was grossly normal. Pulmonic valve regurgitation is not visualized.  No evidence of pulmonic stenosis.  Aorta: The aortic root, ascending aorta, aortic arch and descending aorta are all structurally normal, with no evidence of dilitation or obstruction. There is moderate (Grade III) plaque involving the descending aorta.  IAS/Shunts: No atrial level shunt detected by color flow Doppler.  Additional Comments: A device lead is visualized in the right ventricle.  TRICUSPID VALVE TR Peak grad:   27.0 mmHg TR Vmax:        260.00 cm/s  Jerel Balding MD Electronically signed by Jerel Balding MD Signature Date/Time: 03/04/2024/3:37:26 PM    Final      CARDIAC MRI  MR CARDIAC MORPHOLOGY W WO CONTRAST 02/29/2024  Narrative CLINICAL DATA:  Clinical  question of scar formation Study assumes HCT of 32 and BSA of 2.49 m2.  EXAM: CARDIAC MRI  TECHNIQUE: The patient was scanned on a 1.5 Tesla GE magnet. A dedicated cardiac coil was used. Functional imaging was done using Fiesta sequences. 2,3, and 4 chamber views were done to assess for RWMA's. Modified Simpson's rule using was used to calculate an ejection fraction on a dedicated work [employer]. The patient received 10 cc of Gadavist . After 10 minutes inversion recovery sequences were used to assess for infiltration and scar tissue. Flow quantification was performed 2 times during this examination with flow quantification performed at the levels of the ascending aorta above the valve, pulmonary artery above the valve.  CONTRAST:  10 cc  of Gadavist   FINDINGS: 1. Normal left ventricular size, with LVEDD 55 mm, and LVEDVi 59 mL/m2.  Normal left ventricular thickness.  Moderate decrease ventricular systolic function (LVEF =35%). Patient is free breathing and in a irregular rapid rhythm. Grossly suggestive of global hypokinesis. Unable to perform accurate strain.  Left ventricular parametric mapping notable for elevated native T1 in the basal lateral (1372 ms), poor post  contrast T1 comparison for ECV, and increased T2 in the basal lateral (59 ms).  There is late gadolinium enhancement in the left ventricular myocardium, that is subendocardial, in the inferior lateral wall. This accounts for 75% of the wall.  Cannot exclude abnormal perfusion in the mid inferolateral.  2. Normal right ventricular size with RVEDVI 53 mL/m2.  Normal right ventricular thickness.  Moderate decrease in the right ventricular systolic function (RVEF =31%). There are no regional wall motion abnormalities or aneurysms but global hypokinesis.  3.  Normal right atrial size with left atrial enlargement.  4. Normal size of the aortic root, ascending aorta. Moderate dilation of the main pulmonary artery, 31 mm.  5. Valve assessment:  Aortic Valve: Tri-leaflet and calcified valve. Mean gradient 4 mm Hg using anti-aliasing. Regurgitant fraction of 21% may overestimate regurgitation in the setting of RVR acquisition.  Pulmonic Valve: Mild regurgitation, regurgitant fraction of 8% may overestimate regurgitation in the setting of RVR acquisition.  Tricuspid Valve: Sub-optimal view of valve morphology. Qualitatively mild, central regurgitation.  Mitral Valve: Mild central regurgitation. Regurgitant fraction of 13%.  6. Normal pericardium. Small basal lateral pericardial effusion without evidence of tamponade physiology.  7. Grossly, bilateral pleural effusion noted but no extracardiac findings. Recommended dedicated study if concerned for non-cardiac pathology.  8. Difficult acquisition in the setting of free breathing and AF RVR.  IMPRESSION: 1. LVEF 35% in the setting of AF RVR. 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 In correlation with lab testing showing no acute infarction this admission, this is less likely a viable territory.  2.  RVEF 31% in the setting of AF RVR.  3.  Bilateral pleural effusions incidentally  noted.  Stanly Leavens MD   Electronically Signed By: Stanly Leavens M.D. 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 (basal inferolateral sub-endocardial) - managed by EP: on amiodarone  PO with prolonged IV load and metoprolol  succinate  Persistent AF - He is in AFL today, rates tend to be better controlled  -  S/p 9/125/25  - s/p failed 03/04/24 DCCV; EP f/u as outpatient for consideration of Ablation or repeat DCCV - AC and BB  Acute on Chronic HF - NYHA I, hypervolemic and much improved - Creatine is creeping up, I am working down to torsemide  20 mg PO daily - continue MRA (25 mg PO Daily) - BB as above - outpatient SGLT2i (high risk of yeast infection) - low dose ARB; no further increase BP (would not tolerate)  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 - BMI is 30+  OSA and DM - would meet criteria for GLP-1 based therapy - discussed protein intake, 25 g fiber intake, small meals, and decreasing high fats to minimize symptom burden - after outpatient f/u visit, would recommend Pharm D eval for consideration    For questions or updates, please contact [facility] Please consult [email or web address] for contact info under Cardiology/STEMI.      Stanly Leavens, MD FASE [facility] Cardiologist [facility]  [address] [place], [address] [place], [place]  [address] [phone]  8:46 AM

## 2024-03-06 NOTE — Plan of Care (Signed)
 ?  Problem: Coping: ?Goal: Level of anxiety will decrease ?Outcome: Progressing ?  ?Problem: Safety: ?Goal: Ability to remain free from injury will improve ?Outcome: Progressing ?  ?

## 2024-03-06 NOTE — [place] FL2 (Signed)
   MEDICAID FL2 LEVEL OF CARE FORM     IDENTIFICATION  Patient Name: Mike Hunter Birthdate: 03-14-1951 Sex: male Admission Date (Current Location): 02/25/2024  [facility] and [place] Number:  Producer, television/film/video and Address:  The . [facility], [address], [place], [place] [address]      Provider Number: [id number]  Attending Physician Name and Address:  Cindy Garnette POUR, MD  Relative Name and Phone Number:  Myshawn, [place] (Spouse)  [phone]    Current Level of Care: Hospital Recommended Level of Care: Skilled Nursing Facility Prior Approval Number:    Date Approved/Denied:   PASRR Number: pending  Discharge Plan: SNF    Current Diagnoses: Patient Active Problem List   Diagnosis Date Noted   Pressure injury of skin 02/26/2024   Chest pain 02/25/2024   Atrial fibrillation with rapid ventricular response (HCC) 08/24/2023   Moderate persistent asthma with exacerbation 06/07/2023   COPD with acute exacerbation (HCC) 06/07/2023   CAP (community acquired pneumonia) 06/07/2023   Sepsis due to pneumonia (HCC) 06/06/2023   Anemia due to stage 3b chronic kidney disease (HCC) 08/01/2021   Hypokalemia 09/26/2018   Hypomagnesemia 09/26/2018   Acute on chronic diastolic CHF (congestive heart failure) (HCC) 09/26/2018   CAD (coronary artery disease) 09/26/2018   Asthma 09/26/2018   Depression with anxiety 09/26/2018   Asthma with status asthmaticus    Acute kidney injury superimposed on stage 3b chronic kidney disease (HCC) 09/25/2018   Benign prostatic hyperplasia 01/17/2014   Type II or unspecified type diabetes mellitus with neurological manifestations, not stated as uncontrolled(250.60) 02/09/2013   Unspecified urinary incontinence 02/09/2013   Esophageal reflux 02/09/2013   Insulin -requiring or dependent type II diabetes mellitus (HCC) 12/30/2012   Pyogenic arthritis of knee (HCC) 12/30/2012    Orientation RESPIRATION BLADDER Height &  Weight     Self, Time, Situation, Place  Normal Continent Weight: 262 lb 2 oz (118.9 kg) Height:  5' 10 (177.8 cm)  BEHAVIORAL SYMPTOMS/MOOD NEUROLOGICAL BOWEL NUTRITION STATUS      Continent Diet (See dc summary)  AMBULATORY STATUS COMMUNICATION OF NEEDS Skin   Limited Assist Verbally Normal                       Personal Care Assistance Level of Assistance  Dressing, Bathing, Feeding Bathing Assistance: Limited assistance Feeding assistance: Limited assistance Dressing Assistance: Limited assistance     Functional Limitations Info  Sight, Hearing, Speech Sight Info: Impaired Hearing Info: Impaired Speech Info: Adequate    SPECIAL CARE FACTORS FREQUENCY  PT (By licensed PT), OT (By licensed OT)     PT Frequency: 5x/wk OT Frequency: 5x/wk            Contractures Contractures Info: Not present    Additional Factors Info  Code Status, Allergies Code Status Info: Full Code Allergies Info: Buprenorphine Hcl  Hydromorphone   Codeine  Mirtazapine  Morphine  And Codeine           Current Medications (03/06/2024):  This is the current hospital active medication list Current Facility-Administered Medications  Medication Dose Route Frequency Provider Last Rate Last Admin   amiodarone  (PACERONE ) tablet 200 mg  200 mg Oral BID Ursuy, Renee Lynn, PA-C   200 mg at 03/06/24 1104   arformoterol  (BROVANA ) nebulizer solution 15 mcg  15 mcg Nebulization BID Rai, Ripudeep K, MD   15 mcg at 03/06/24 0842   atorvastatin  (LIPITOR ) tablet 80 mg  80 mg Oral Daily  Rai, Nydia POUR, MD   80 mg at 03/06/24 1104   budesonide  (PULMICORT ) nebulizer solution 0.5 mg  2 mL Nebulization BID Rai, Ripudeep K, MD   0.5 mg at 03/06/24 9157   clopidogrel  (PLAVIX ) tablet 75 mg  75 mg Oral Daily Lonni Slain, MD   75 mg at 03/06/24 1104   donepezil  (ARICEPT ) tablet 10 mg  10 mg Oral QHS Rai, Ripudeep K, MD   10 mg at 03/05/24 2126   doxepin  (SINEQUAN ) capsule 10 mg  10 mg Oral QHS Cindy Garnette POUR, MD   10 mg at 03/05/24 2126   insulin  aspart (novoLOG ) injection 0-15 Units  0-15 Units Subcutaneous TID WC Rai, Ripudeep K, MD   5 Units at 03/06/24 0700   insulin  aspart (novoLOG ) injection 0-5 Units  0-5 Units Subcutaneous QHS Rai, Ripudeep K, MD   2 Units at 03/02/24 0043   insulin  aspart (novoLOG ) injection 8 Units  8 Units Subcutaneous TID WC Rai, Ripudeep K, MD   8 Units at 03/06/24 9157   insulin  glargine (LANTUS ) injection 40 Units  40 Units Subcutaneous Daily Rai, Ripudeep K, MD   40 Units at 03/06/24 1104   latanoprost  (XALATAN ) 0.005 % ophthalmic solution 1 drop  1 drop Both Eyes QHS Rai, Ripudeep K, MD   1 drop at 03/05/24 2112   levalbuterol  (XOPENEX ) nebulizer solution 0.63 mg  0.63 mg Nebulization Q6H PRN Rai, Ripudeep K, MD       lidocaine  (LIDODERM ) 5 % 1 patch  1 patch Transdermal Q24H Sundil, Subrina, MD   1 patch at 03/05/24 2112   losartan  (COZAAR ) tablet 25 mg  25 mg Oral Daily Chandrasekhar, Mahesh A, MD   25 mg at 03/06/24 1105   memantine  (NAMENDA ) tablet 10 mg  10 mg Oral BID Rai, Ripudeep K, MD   10 mg at 03/06/24 1105   metoprolol  succinate (TOPROL -XL) 24 hr tablet 50 mg  50 mg Oral QHS Chandrasekhar, Mahesh A, MD   50 mg at 03/05/24 2126   mirabegron  ER (MYRBETRIQ ) tablet 50 mg  50 mg Oral Daily Rai, Ripudeep K, MD   50 mg at 03/06/24 1105   montelukast  (SINGULAIR ) tablet 10 mg  10 mg Oral QHS Rai, Ripudeep K, MD   10 mg at 03/05/24 2126   ondansetron  (ZOFRAN ) tablet 4 mg  4 mg Oral Q6H PRN Lonni Slain, MD       Or   ondansetron  (ZOFRAN ) injection 4 mg  4 mg Intravenous Q6H PRN Lonni Slain, MD   4 mg at 02/27/24 1644   oxybutynin  (DITROPAN -XL) 24 hr tablet 10 mg  10 mg Oral Daily Cindy Garnette POUR, MD   10 mg at 03/06/24 1105   pantoprazole  (PROTONIX ) EC tablet 40 mg  40 mg Oral Daily Cindy Garnette POUR, MD   40 mg at 03/06/24 1105   rivaroxaban  (XARELTO ) tablet 20 mg  20 mg Oral Q supper Ursuy, Renee Lynn, PA-C   20 mg at 03/05/24 1717    sertraline  (ZOLOFT ) tablet 100 mg  100 mg Oral Daily Rai, Ripudeep K, MD   100 mg at 03/06/24 1105   sodium chloride  flush (NS) 0.9 % injection 3 mL  3 mL Intravenous Q12H Ursuy, Renee Lynn, PA-C   3 mL at 03/05/24 2141   spironolactone  (ALDACTONE ) tablet 25 mg  25 mg Oral Daily Rai, Ripudeep K, MD   25 mg at 03/06/24 1106   torsemide  (DEMADEX ) tablet 20 mg  20 mg Oral Daily Chandrasekhar, Mahesh  A, MD   20 mg at 03/06/24 9140     Discharge Medications: Please see discharge summary for a list of discharge medications.  Relevant Imaging Results:  Relevant Lab Results:   Additional Information SSN [id number]  Sheri ONEIDA Sharps, LCSW

## 2024-03-06 NOTE — TOC PASRR Note (Signed)
 30 Day PASRR Note   Patient Details  Name: Mike Hunter Date of Birth: Jun 01, 1951   Transition of Care [facility]) CM/SW Contact:    Sheri ONEIDA Sharps, LCSW Phone Number: 03/06/2024, 11:13 AM  To Whom It May Concern:  Please be advised that this patient will require a short-term nursing home stay - anticipated 30 days or less for rehabilitation and strengthening.   The plan is for return home.

## 2024-03-07 DIAGNOSIS — R079 Chest pain, unspecified: Secondary | ICD-10-CM | POA: Diagnosis not present

## 2024-03-07 DIAGNOSIS — I4891 Unspecified atrial fibrillation: Secondary | ICD-10-CM | POA: Diagnosis not present

## 2024-03-07 DIAGNOSIS — I4892 Unspecified atrial flutter: Secondary | ICD-10-CM | POA: Diagnosis not present

## 2024-03-07 LAB — GLUCOSE, CAPILLARY
Glucose-Capillary: 166 mg/dL — ABNORMAL HIGH (ref 70–99)
Glucose-Capillary: 253 mg/dL — ABNORMAL HIGH (ref 70–99)
Glucose-Capillary: 141 mg/dL — ABNORMAL HIGH (ref 70–99)
Glucose-Capillary: 158 mg/dL — ABNORMAL HIGH (ref 70–99)

## 2024-03-07 LAB — CBC
HCT: 34.5 % — ABNORMAL LOW (ref 39.0–52.0)
Hemoglobin: 10.2 g/dL — ABNORMAL LOW (ref 13.0–17.0)
MCH: 21.9 pg — ABNORMAL LOW (ref 26.0–34.0)
MCHC: 29.6 g/dL — ABNORMAL LOW (ref 30.0–36.0)
MCV: 74.2 fL — ABNORMAL LOW (ref 80.0–100.0)
Platelets: 377 K/uL (ref 150–400)
RBC: 4.65 MIL/uL (ref 4.22–5.81)
RDW: 17.5 % — ABNORMAL HIGH (ref 11.5–15.5)
WBC: 11.1 K/uL — ABNORMAL HIGH (ref 4.0–10.5)
nRBC: 0 % (ref 0.0–0.2)

## 2024-03-07 LAB — COMPREHENSIVE METABOLIC PANEL WITH GFR
ALT: 22 U/L (ref 0–44)
AST: 17 U/L (ref 15–41)
Albumin: 2.8 g/dL — ABNORMAL LOW (ref 3.5–5.0)
Alkaline Phosphatase: 81 U/L (ref 38–126)
Anion gap: 10 (ref 5–15)
BUN: 36 mg/dL — ABNORMAL HIGH (ref 8–23)
CO2: 23 mmol/L (ref 22–32)
Calcium: 8.4 mg/dL — ABNORMAL LOW (ref 8.9–10.3)
Chloride: 104 mmol/L (ref 98–111)
Creatinine, Ser: 1.81 mg/dL — ABNORMAL HIGH (ref 0.61–1.24)
GFR, Estimated: 39 mL/min — ABNORMAL LOW (ref >60)
Glucose, Bld: 177 mg/dL — ABNORMAL HIGH (ref 70–99)
Potassium: 3.9 mmol/L (ref 3.5–5.1)
Sodium: 137 mmol/L (ref 135–145)
Total Bilirubin: 0.6 mg/dL (ref 0.0–1.2)
Total Protein: 6.5 g/dL (ref 6.5–8.1)

## 2024-03-07 LAB — MAGNESIUM: Magnesium: 2.3 mg/dL (ref 1.7–2.4)

## 2024-03-07 NOTE — Progress Notes (Signed)
 Progress Note   Patient: Mike Hunter FMW:[id number] DOB: 03-Aug-1950 DOA: 02/25/2024     10 DOS: the patient was seen and examined on 03/07/2024   Brief hospital course: 73 year old male with paroxysmal atrial fibrillation on Xarelto , chronic HFpEF, COPD, CKD stage IIIb diabetes mellitus type 2, HTN, osteoporosis, CAD status post PCI, obesity, OSA on CPAP presented to ED with palpitations for 1 week.  He also reported chest pain, pressure with radiation down the left arm, associated shortness of breath, prior to presenting to ED, heart rate was in 130s In ED, patient was found to have heart rate 131, BP 105/53 and was placed on Cardizem  drip cardiology was consulted.  Assessment and Plan: Principal Problem:   Atrial fibrillation with rapid ventricular response (HCC) - Initially placed on Cardizem  drip  - Torsemide , Aldactone , metoprolol , Imdur  were held due to borderline BP - 2D echo showed EF of 45 to 50%, global hypokinesis, diastolic parameters indeterminate -  DCCV on 9/12 but had a reccurrence of AF postprocedure on 9/13, started on amiodarone  drip by cardiology - EP cardiology consulted, continued amiodarone  - Pt now failed attempt at DCCV 9/19 -cont with anticoag with beta blocker, amiodarone . Cardiology plan cardioversion 3-4 weeks after rehab -Planning for SNF, TOC following   Wide-complex tachycardia, hemodynamically unstable, chest pain - On 9/13, patient had sudden onset of V. tach, rate 180s to 190s, seen by cardiology urgently and was given IV amiodarone  bolus x2, placed on amiodarone  drip - EP cardiology following, now s/p ICD on 9/16   Acute on chronic systolic and diastolic heart failure with volume overload - Noted to be in volume overload on 9/13, torsemide  and Aldactone  had been held on the admission due to borderline BP - Was on IV lasix , now on torsemide  BID per Cardiology   CAD status post PCI,  hypertension -Previous echo 12/2023 had shown EF of 55 to 60%,  indeterminate left ventricular diastolic parameters, global hypokinesis, moderate asymmetric LVH of the septal segment.  Mild to moderate aortic stenosis - Continue Plavix , cardiology following    COPD with mild exacerbation Leukocytosis, acute bronchitis -Improving, continue Pulmicort , Brovana , Xopenex  nebs -Troponin 0.71 -Completed IV Unasyn  9/20      Insulin -requiring or dependent type II diabetes mellitus (HCC), uncontrolled with hyperglycemia  -On high doses of insulin  outpatient, last A1c 6.7 -CBG still uncontrolled, increased Lantus  to 45 units daily, NovoLog  8 units 3 times daily AC, moderate SSI -Diabetic coordinator consult     Anemia due to stage 3b chronic kidney disease (HCC) -H&H currently stable   Mild acute on CKD stage IIIb - Baseline creatinine 1.4-1.7 likely worsened due to A-fib with RVR, now on diuresis  - Creatinine currently up to 1.8 -May have to hold next dose of torsemide  if Cr remains elevated   Hyperlipidemia - Resume statin    Obesity class II Estimated body mass index is 39.48 kg/m as calculated from the following:   Height as of this encounter: 5' 10 (1.778 m).   Weight as of this encounter: 124.8 kg.       Subjective: Without chest pain or sob this AM  Physical Exam: Vitals:   03/07/24 0800 03/07/24 0809 03/07/24 0820 03/07/24 1225  BP: (!) 119/49  109/69 121/81  Pulse: 80  87   Resp: 20  (!) 24   Temp: 98.2 F (36.8 C)  98.2 F (36.8 C) 98.1 F (36.7 C)  TempSrc:   Oral Oral  SpO2: 96% 100% 98%   Weight:  Height:       General exam: Conversant, in no acute distress Respiratory system: normal chest rise, clear, no audible wheezing Cardiovascular system: regular rhythm, s1-s2 Gastrointestinal system: Nondistended, nontender, pos BS Central nervous system: No seizures, no tremors Extremities: No cyanosis, no joint deformities Skin: No rashes, no pallor Psychiatry: Affect normal // mood normal  Data Reviewed:  Labs  reviewed: Na 137, K 3.9, Cr 1.81, WBC 11.1, Hgb 10.2, Plts 377  Family Communication: Pt in room, family not at bedside  Disposition: Status is: Inpatient Remains inpatient appropriate because: severity of illness  Planned Discharge Destination: Skilled nursing facility     Author: Garnette Pelt, MD 03/07/2024 2:19 PM  For on call review [email or web address].

## 2024-03-07 NOTE — Progress Notes (Signed)
 Patient seen and examined. Remains in rate controlled AF overnight (VR 80-90s) with intermittent VP. No complaints this morning. Planning for discharge to SNF. S/p DCCV on 09/12 with recurrence and repeat cardioversion Friday with recurrence. Continuing OP amio (currently 200 mg bid, toprol  XL 50 mg qhs) and plan for OP cardioversion in 3-4 weeks after out of rehab.On plavix +Xarelto  with recent PCI. Device site looks good with steri strips on.   [name], MD [facility]  Cardiac Electrophysiology

## 2024-03-07 NOTE — Plan of Care (Signed)
  Problem: Education: Goal: Knowledge of General Education information will improve Description: Including pain rating scale, medication(s)/side effects and non-pharmacologic comfort measures Outcome: Progressing   Problem: Activity: Goal: Risk for activity intolerance will decrease Outcome: Progressing   Problem: Coping: Goal: Level of anxiety will decrease Outcome: Progressing   Problem: Safety: Goal: Ability to remain free from injury will improve Outcome: Progressing   Problem: Activity: Goal: Ability to tolerate increased activity will improve Outcome: Progressing   Problem: Cardiac: Goal: Ability to achieve and maintain adequate cardiopulmonary perfusion will improve Outcome: Progressing

## 2024-03-07 NOTE — Progress Notes (Signed)
 Progress Note  Patient Name: Mike Hunter Date of Encounter: 03/07/2024 Primary Cardiologist: New to Dr. Santo   Subjective   Rate controlled AF overnight- seen this am by cardiac EP, who advised continued amiodarone  and Toprol  and plan for OP cardioversion in 3-4 weeks after completing rehab if he persists.  Vital Signs    Vitals:   03/07/24 0430 03/07/24 0800 03/07/24 0809 03/07/24 0820  BP: (!) 153/76 (!) 119/49  109/69  Pulse: 86 80  87  Resp: 16 20  (!) 24  Temp: 98.2 F (36.8 C) 98.2 F (36.8 C)  98.2 F (36.8 C)  TempSrc: Oral   Oral  SpO2: 98% 96% 100% 98%  Weight:      Height:        Intake/Output Summary (Last 24 hours) at 03/07/2024 0855 Last data filed at 03/07/2024 0800 Gross per 24 hour  Intake 720 ml  Output 1600 ml  Net -880 ml   Filed Weights   02/29/24 0447 03/01/24 0023 03/05/24 0400  Weight: 125.2 kg 124.8 kg 118.9 kg    Physical Exam   GEN: No acute distress.   Neck: No JVD  Cardiac: Irregularly irregular, no murmur Respiratory: CTAB GI: Soft, nontender MS: No edema  Labs   Telemetry: AFL rates  in the 80-90's   Chemistry Recent Labs  Lab 03/05/24 0249 03/06/24 0228 03/07/24 0316  NA 138 137 137  K 3.3* 3.6 3.9  CL 102 105 104  CO2 22 23 23   GLUCOSE 181* 249* 177*  BUN 35* 39* 36*  CREATININE 1.70* 1.84* 1.81*  CALCIUM  8.5* 8.5* 8.4*  PROT 6.8 6.8 6.5  ALBUMIN  2.9* 2.8* 2.8*  AST 17 20 17   ALT 21 21 22   ALKPHOS 84 85 81  BILITOT 0.6 0.5 0.6  GFRNONAA 42* 38* 39*  ANIONGAP 14 9 10      Hematology Recent Labs  Lab 03/04/24 0235 03/05/24 0249 03/07/24 0316  WBC 9.8 11.6* 11.1*  RBC 4.69 4.64 4.65  HGB 10.2* 10.1* 10.2*  HCT 34.7* 34.2* 34.5*  MCV 74.0* 73.7* 74.2*  MCH 21.7* 21.8* 21.9*  MCHC 29.4* 29.5* 29.6*  RDW 17.5* 17.5* 17.5*  PLT 319 368 377      Cardiac Studies   Cardiac Studies & Procedures    ______________________________________________________________________________________________     ECHOCARDIOGRAM  ECHOCARDIOGRAM COMPLETE 02/26/2024  Narrative ECHOCARDIOGRAM REPORT    Patient Name:   Herrick C Masini Date of Exam: 02/26/2024 Medical Rec #:  [id number]     Height:       70.0 in Accession #:    [id number]    Weight:       275.1 lb Date of Birth:  01/15/1951      BSA:          2.390 m Patient Age:    73 years      BP:           111/75 mmHg Patient Gender: M             HR:           87 bpm. Exam Location:  Inpatient  Procedure: 2D Echo and Intracardiac Opacification Agent (Both Spectral and Color Flow Doppler were utilized during procedure).  Indications:     Dyspnea  History:         Patient has prior history of Echocardiogram examinations. Aortic Valve Disease, Arrythmias:Atrial Fibrillation; Signs/Symptoms:Dyspnea.  Sonographer:     Charmaine Gaskins Referring Phys:  [id number] SARA-MAIZ A DEBBY  Diagnosing Phys: Lonni Nanas MD  IMPRESSIONS   1. Left ventricular ejection fraction, by estimation, is 45 to 50%. The left ventricle has mildly decreased function. The left ventricle demonstrates global hypokinesis. The left ventricular internal cavity size was mildly dilated. Left ventricular diastolic parameters are indeterminate. 2. Right ventricular systolic function is normal. The right ventricular size is normal. There is mildly elevated pulmonary artery systolic pressure. The estimated right ventricular systolic pressure is 37.8 mmHg. 3. Left atrial size was mildly dilated. 4. The mitral valve is normal in structure. Mild mitral valve regurgitation. 5. The aortic valve is calcified. Aortic valve regurgitation is not visualized. Mild to moderate aortic valve stenosis. Vmax 2.8 m/s, MG 21 mmHg, AVA 1.6 cm^2, DI 0.45 6. The inferior vena cava is normal in size with greater than 50% respiratory variability, suggesting right atrial pressure of 3  mmHg.  FINDINGS Left Ventricle: Left ventricular ejection fraction, by estimation, is 45 to 50%. The left ventricle has mildly decreased function. The left ventricle demonstrates global hypokinesis. Definity  contrast agent was given IV to delineate the left ventricular endocardial borders. The left ventricular internal cavity size was mildly dilated. There is no left ventricular hypertrophy. Left ventricular diastolic parameters are indeterminate.  Right Ventricle: The right ventricular size is normal. No increase in right ventricular wall thickness. Right ventricular systolic function is normal. There is mildly elevated pulmonary artery systolic pressure. The tricuspid regurgitant velocity is 2.95 m/s, and with an assumed right atrial pressure of 3 mmHg, the estimated right ventricular systolic pressure is 37.8 mmHg.  Left Atrium: Left atrial size was mildly dilated.  Right Atrium: Right atrial size was normal in size.  Pericardium: Trivial pericardial effusion is present.  Mitral Valve: The mitral valve is normal in structure. Mild mitral valve regurgitation.  Tricuspid Valve: The tricuspid valve is normal in structure. Tricuspid valve regurgitation is mild.  Aortic Valve: The aortic valve is calcified. Aortic valve regurgitation is not visualized. Mild to moderate aortic stenosis is present. Aortic valve mean gradient measures 18.6 mmHg. Aortic valve peak gradient measures 27.5 mmHg. Aortic valve area, by VTI measures 1.39 cm.  Pulmonic Valve: The pulmonic valve was not well visualized. Pulmonic valve regurgitation is not visualized.  Aorta: The aortic root and ascending aorta are structurally normal, with no evidence of dilitation.  Venous: The inferior vena cava is normal in size with greater than 50% respiratory variability, suggesting right atrial pressure of 3 mmHg.  IAS/Shunts: The atrial septum is grossly normal.   LEFT VENTRICLE PLAX 2D LVIDd:         6.00 cm       Diastology LVIDs:         4.60 cm      LV e' medial:    7.87 cm/s LV PW:         0.90 cm      LV E/e' medial:  14.2 LV IVS:        1.00 cm      LV e' lateral:   8.01 cm/s LVOT diam:     2.20 cm      LV E/e' lateral: 13.9 LV SV:         77 LV SV Index:   32 LVOT Area:     3.80 cm  LV Volumes (MOD) LV vol d, MOD A2C: 122.0 ml LV vol d, MOD A4C: 121.0 ml LV vol s, MOD A2C: 43.7 ml LV vol s, MOD A4C: 76.9 ml LV SV MOD A2C:  78.3 ml LV SV MOD A4C:     121.0 ml LV SV MOD BP:      64.1 ml  RIGHT VENTRICLE RV Basal diam:  3.50 cm RV Mid diam:    3.60 cm RV S prime:     11.20 cm/s  LEFT ATRIUM             Index        RIGHT ATRIUM           Index LA diam:        4.80 cm 2.01 cm/m   RA Area:     19.10 cm LA Vol (A2C):   89.8 ml 37.57 ml/m  RA Volume:   47.80 ml  20.00 ml/m LA Vol (A4C):   87.9 ml 36.77 ml/m LA Biplane Vol: 91.9 ml 38.45 ml/m AORTIC VALVE AV Area (Vmax):    1.52 cm AV Area (Vmean):   1.39 cm AV Area (VTI):     1.39 cm AV Vmax:           262.40 cm/s AV Vmean:          209.000 cm/s AV VTI:            0.554 m AV Peak Grad:      27.5 mmHg AV Mean Grad:      18.6 mmHg LVOT Vmax:         104.92 cm/s LVOT Vmean:        76.200 cm/s LVOT VTI:          0.202 m LVOT/AV VTI ratio: 0.36  AORTA Ao Asc diam: 3.40 cm  MITRAL VALVE                TRICUSPID VALVE MV Area (PHT): 4.70 cm     TR Peak grad:   34.8 mmHg MV Decel Time: 161 msec     TR Vmax:        295.00 cm/s MV E velocity: 111.67 cm/s SHUNTS Systemic VTI:  0.20 m Systemic Diam: 2.20 cm  Lonni Nanas MD Electronically signed by Lonni Nanas MD Signature Date/Time: 02/26/2024/2:34:07 PM    Final (Updated)   TEE  ECHO TEE 03/04/2024  Narrative TRANSESOPHOGEAL ECHO REPORT    Patient Name:   Bode C Lindner Date of Exam: 03/04/2024 Medical Rec #:  [id number]     Height:       70.0 in Accession #:    [id number]    Weight:       275.1 lb Date of Birth:  [birth year]      BSA:           2.390 m Patient Age:    73 years      BP:           102/70 mmHg Patient Gender: M             HR:           103 bpm. Exam Location:  Inpatient  Procedure: Color Doppler, Cardiac Doppler and Transesophageal Echo (Both Spectral and Color Flow Doppler were utilized during procedure).  Indications:    Cardioversion  History:        Patient has prior history of Echocardiogram examinations, most recent 02/26/2024.  Sonographer:    Tinnie Gosling RDCS Referring Phys: [phone] MIHAI CROITORU  PROCEDURE: After discussion of the risks and benefits of a TEE, an informed consent was obtained. The transesophogeal probe was passed without difficulty through the esophogus of the patient. Sedation performed by  different physician. The patient was monitored while under deep sedation. Anesthestetic sedation was provided intravenously by Anesthesiology: 159mg  of Propofol , 60mg  of Lidocaine . The patient developed no complications during the procedure.  IMPRESSIONS   1. Left ventricular ejection fraction, by estimation, is 35 to 40%. The left ventricle has moderately decreased function. The left ventricle demonstrates global hypokinesis. Left ventricular diastolic function could not be evaluated. 2. Right ventricular systolic function is mildly reduced. The right ventricular size is normal. The estimated right ventricular systolic pressure is 35.0 mmHg. 3. Left atrial size was moderately dilated. No left atrial/left atrial appendage thrombus was detected. The LAA emptying velocity was 23 cm/s. 4. Right atrial size was mildly dilated. 5. The mitral valve is normal in structure. Trivial mitral valve regurgitation. No evidence of mitral stenosis. 6. Possibly a functionally bicuspid aortic valve. There is a calcified raphe between the left and noncoronary cusps. The aortic valve is abnormal. There is mild calcification of the aortic valve. There is mild thickening of the aortic valve. Aortic valve regurgitation is  not visualized. Aortic valve sclerosis/calcification is present, without any evidence of aortic stenosis. 7. There is Moderate (Grade III) plaque involving the descending aorta.  FINDINGS Left Ventricle: Left ventricular ejection fraction, by estimation, is 35 to 40%. The left ventricle has moderately decreased function. The left ventricle demonstrates global hypokinesis. The left ventricular internal cavity size was normal in size. There is no left ventricular hypertrophy. Left ventricular diastolic function could not be evaluated due to atrial fibrillation. Left ventricular diastolic function could not be evaluated.   LV Wall Scoring: The posterior wall and basal inferior segment are severely hypokinetic, on a background of mild global hypokinesis.  Right Ventricle: The right ventricular size is normal. No increase in right ventricular wall thickness. Right ventricular systolic function is mildly reduced. The tricuspid regurgitant velocity is 2.60 m/s, and with an assumed right atrial pressure of 8 mmHg, the estimated right ventricular systolic pressure is 35.0 mmHg.  Left Atrium: Left atrial size was moderately dilated. No left atrial/left atrial appendage thrombus was detected. The LAA emptying velocity was 23 cm/s.  Right Atrium: Right atrial size was mildly dilated.  Pericardium: There is no evidence of pericardial effusion.  Mitral Valve: The mitral valve is normal in structure. Trivial mitral valve regurgitation. No evidence of mitral valve stenosis.  Tricuspid Valve: The tricuspid valve is normal in structure. Tricuspid valve regurgitation is trivial.  Aortic Valve: Possibly a functionally bicuspid aortic valve. There is a calcified raphe between the left and noncoronary cusps. The aortic valve is abnormal. There is mild calcification of the aortic valve. There is mild thickening of the aortic valve. Aortic valve regurgitation is not visualized. Aortic valve sclerosis/calcification  is present, without any evidence of aortic stenosis.  Pulmonic Valve: The pulmonic valve was grossly normal. Pulmonic valve regurgitation is not visualized. No evidence of pulmonic stenosis.  Aorta: The aortic root, ascending aorta, aortic arch and descending aorta are all structurally normal, with no evidence of dilitation or obstruction. There is moderate (Grade III) plaque involving the descending aorta.  IAS/Shunts: No atrial level shunt detected by color flow Doppler.  Additional Comments: A device lead is visualized in the right ventricle.  TRICUSPID VALVE TR Peak grad:   27.0 mmHg TR Vmax:        260.00 cm/s  Jerel Balding MD Electronically signed by Jerel Balding MD Signature Date/Time: 03/04/2024/3:37:26 PM    Final      CARDIAC MRI  MR CARDIAC MORPHOLOGY  W WO CONTRAST 02/29/2024  Narrative CLINICAL DATA:  Clinical question of scar formation Study assumes HCT of 32 and BSA of 2.49 m2.  EXAM: CARDIAC MRI  TECHNIQUE: The patient was scanned on a 1.5 Tesla GE magnet. A dedicated cardiac coil was used. Functional imaging was done using Fiesta sequences. 2,3, and 4 chamber views were done to assess for RWMA's. Modified Simpson's rule using was used to calculate an ejection fraction on a dedicated work [employer]. The patient received 10 cc of Gadavist . After 10 minutes inversion recovery sequences were used to assess for infiltration and scar tissue. Flow quantification was performed 2 times during this examination with flow quantification performed at the levels of the ascending aorta above the valve, pulmonary artery above the valve.  CONTRAST:  10 cc  of Gadavist   FINDINGS: 1. Normal left ventricular size, with LVEDD 55 mm, and LVEDVi 59 mL/m2.  Normal left ventricular thickness.  Moderate decrease ventricular systolic function (LVEF =35%). Patient is free breathing and in a irregular rapid rhythm. Grossly suggestive of global  hypokinesis. Unable to perform accurate strain.  Left ventricular parametric mapping notable for elevated native T1 in the basal lateral (1372 ms), poor post contrast T1 comparison for ECV, and increased T2 in the basal lateral (59 ms).  There is late gadolinium enhancement in the left ventricular myocardium, that is subendocardial, in the inferior lateral wall. This accounts for 75% of the wall.  Cannot exclude abnormal perfusion in the mid inferolateral.  2. Normal right ventricular size with RVEDVI 53 mL/m2.  Normal right ventricular thickness.  Moderate decrease in the right ventricular systolic function (RVEF =31%). There are no regional wall motion abnormalities or aneurysms but global hypokinesis.  3.  Normal right atrial size with left atrial enlargement.  4. Normal size of the aortic root, ascending aorta. Moderate dilation of the main pulmonary artery, 31 mm.  5. Valve assessment:  Aortic Valve: Tri-leaflet and calcified valve. Mean gradient 4 mm Hg using anti-aliasing. Regurgitant fraction of 21% may overestimate regurgitation in the setting of RVR acquisition.  Pulmonic Valve: Mild regurgitation, regurgitant fraction of 8% may overestimate regurgitation in the setting of RVR acquisition.  Tricuspid Valve: Sub-optimal view of valve morphology. Qualitatively mild, central regurgitation.  Mitral Valve: Mild central regurgitation. Regurgitant fraction of 13%.  6. Normal pericardium. Small basal lateral pericardial effusion without evidence of tamponade physiology.  7. Grossly, bilateral pleural effusion noted but no extracardiac findings. Recommended dedicated study if concerned for non-cardiac pathology.  8. Difficult acquisition in the setting of free breathing and AF RVR.  IMPRESSION: 1. LVEF 35% in the setting of AF RVR. 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 In correlation with lab testing showing no acute  infarction this admission, this is less likely a viable territory.  2.  RVEF 31% in the setting of AF RVR.  3.  Bilateral pleural effusions incidentally noted.  Stanly Leavens MD   Electronically Signed By: Stanly Leavens M.D. 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 (basal inferolateral sub-endocardial) - managed by EP: on amiodarone  PO with prolonged IV load and metoprolol  succinate - plan for outpatient DCCV after  completing SNF rehab  Persistent AF - He is in AFL today, rates tend to be better controlled  - continue amiodarone  and Toprol  - Xarelto  for anticoagulation  Acute on Chronic HF - NYHA I, hypervolemic and much improved - Creatine stable, continue torsemide  20 mg PO daily - continue MRA (25 mg PO Daily) - BB as above - outpatient SGLT2i (high risk of yeast infection) - low dose ARB; no further increase BP (would not tolerate)  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 - BMI is 30+  OSA and DM - would meet criteria for GLP-1 based therapy - discussed protein intake, 25 g fiber intake, small meals, and decreasing high fats to minimize symptom burden - after outpatient f/u visit, would recommend Pharm D eval for consideration  Stable from cardiac standpoint for SNF. Will sign-off. Call with questions.  [facility] will sign off.    Medication Recommendations:  as above Other recommendations (labs, testing, etc):  as above Follow up as an outpatient:  cardiac EP and Dr. Santo   For questions or updates, please contact [facility] Please consult [email or web address] for contact info under Cardiology/STEMI.   Vinie KYM Maxcy, MD, [facility], FNLA, FACP  [place]  [facility]  Medical Director of the [facility]id [facility]  Cardiovascular Risk Reduction Clinic Diplomate of the American Board of Clinical Lipidology Attending Cardiologist  Direct Dial: [phone]  Fax: [phone]  Website:  [email or web address]  8:55 AM

## 2024-03-07 NOTE — TOC Progression Note (Addendum)
 Transition of Care [facility]) - Progression Note    Patient Details  Name: Mike Hunter MRN: [id number] Date of Birth: 21-Jun-1950  Transition of Care [facility]) CM/SW Contact  [name], [place] Phone Number: 03/07/2024, 9:10 AM  Clinical Narrative:   DONALD approved [id number] E ; dates 03/07/2024-04/06/2024   10:47 AM CSW followed up with patient with medicare.gov ratings for accepting SNF bed offers. Patient stated [facility] is his first choice, [facility] is 2nd, [facility] is 3rd.   [facility] is the only option that has accepted at this time. CSW to follow up with patient other top preferences.    11:14 AM Per [facility], they are unable to offer a bed at this time. CSW awaiting [facility] decision.   11:22 AM Per [facility] they are full at this time. CSW updated patient. Patient expressed his understanding and stated he is okay going to [facility]. CSW left VM for Peggy, patients wife. CSW to reach out to CMA to start auth and will update facility.   12:11 PM CMA started auth for [facility], Aet[employer] Ref# [id number]   CSW will continue to follow.    Expected Discharge Plan: Home/Self Care Barriers to Discharge: Continued Medical Work up               Expected Discharge Plan and Services In-house Referral: NA Discharge Planning Services: CM Consult Post Acute Care Choice: NA Living arrangements for the past 2 months: Single Family Home                 DME Arranged: N/A DME Agency: NA       HH Arranged: NA HH Agency: NA         Social Drivers of Health (SDOH) Interventions SDOH Screenings   Food Insecurity: No Food Insecurity (02/26/2024)  Housing: Low Risk  (02/26/2024)  Transportation Needs: No Transportation Needs (02/26/2024)  Utilities: Not At Risk (02/26/2024)  Social Connections: Socially Integrated (02/26/2024)  Tobacco Use: Medium Risk (02/26/2024)    Readmission Risk Interventions    02/29/2024    3:28 PM 12/31/2023   10:58 AM  Readmission  Risk Prevention Plan  Transportation Screening Complete Complete  Medication Review Oceanographer) Complete Referral to Pharmacy  HRI or Home Care Consult Complete Complete  SW Recovery Care/Counseling Consult  Complete  Palliative Care Screening Not Applicable Not Applicable  Skilled Nursing Facility Not Applicable Not Applicable

## 2024-03-07 NOTE — NC FL2 (Signed)
 [place]  MEDICAID FL2 LEVEL OF CARE FORM     IDENTIFICATION  Patient Name: Mike Hunter Birthdate: [birth year] Sex: male Admission Date (Current Location): 02/25/2024  [facility] and [place] Number:  Producer, television/film/video and Address:  [facility] [place], [address] [place], [place] [address]      Provider Number: [id number]  Attending Physician Name and Address:  Cindy Garnette POUR, MD  Relative Name and Phone Number:  Irvine, Glorioso (Spouse)  [phone]    Current Level of Care: Hospital Recommended Level of Care: Skilled Nursing Facility Prior Approval Number:    Date Approved/Denied:   PASRR Number: [id number]  Discharge Plan: SNF    Current Diagnoses: Patient Active Problem List   Diagnosis Date Noted   Pressure injury of skin 02/26/2024   Chest pain 02/25/2024   Atrial fibrillation with rapid ventricular response (HCC) 08/24/2023   Moderate persistent asthma with exacerbation 06/07/2023   COPD with acute exacerbation (HCC) 06/07/2023   CAP (community acquired pneumonia) 06/07/2023   Sepsis due to pneumonia (HCC) 06/06/2023   Anemia due to stage 3b chronic kidney disease (HCC) 08/01/2021   Hypokalemia 09/26/2018   Hypomagnesemia 09/26/2018   Acute on chronic diastolic CHF (congestive heart failure) (HCC) 09/26/2018   CAD (coronary artery disease) 09/26/2018   Asthma 09/26/2018   Depression with anxiety 09/26/2018   Asthma with status asthmaticus    Acute kidney injury superimposed on stage 3b chronic kidney disease (HCC) 09/25/2018   Benign prostatic hyperplasia 01/17/2014   Type II or unspecified type diabetes mellitus with neurological manifestations, not stated as uncontrolled(250.60) 02/09/2013   Unspecified urinary incontinence 02/09/2013   Esophageal reflux 02/09/2013   Insulin -requiring or dependent type II diabetes mellitus (HCC) 12/30/2012   Pyogenic arthritis of knee (HCC) 12/30/2012    Orientation RESPIRATION BLADDER Height &  Weight     Self, Time, Situation, Place  Normal Continent Weight: 262 lb 2 oz (118.9 kg) Height:  5' 10 (177.8 cm)  BEHAVIORAL SYMPTOMS/MOOD NEUROLOGICAL BOWEL NUTRITION STATUS      Continent Diet (See dc summary)  AMBULATORY STATUS COMMUNICATION OF NEEDS Skin   Limited Assist Verbally Normal                       Personal Care Assistance Level of Assistance  Dressing, Bathing, Feeding Bathing Assistance: Limited assistance Feeding assistance: Limited assistance Dressing Assistance: Limited assistance     Functional Limitations Info  Sight, Hearing, Speech Sight Info: Impaired Hearing Info: Impaired Speech Info: Adequate    SPECIAL CARE FACTORS FREQUENCY  PT (By licensed PT), OT (By licensed OT)     PT Frequency: 5x/wk OT Frequency: 5x/wk            Contractures Contractures Info: Not present    Additional Factors Info  Code Status, Allergies Code Status Info: Full Code Allergies Info: Buprenorphine Hcl  Hydromorphone   Codeine  Mirtazapine  Morphine  And Codeine           Current Medications (03/07/2024):  This is the current hospital active medication list Current Facility-Administered Medications  Medication Dose Route Frequency Provider Last Rate Last Admin   amiodarone  (PACERONE ) tablet 200 mg  200 mg Oral BID Ursuy, Renee Lynn, PA-C   200 mg at 03/06/24 2129   arformoterol  (BROVANA ) nebulizer solution 15 mcg  15 mcg Nebulization BID Rai, Ripudeep K, MD   15 mcg at 03/07/24 0803   atorvastatin  (LIPITOR ) tablet 80 mg  80 mg Oral Daily  Rai, Nydia POUR, MD   80 mg at 03/06/24 1104   budesonide  (PULMICORT ) nebulizer solution 0.5 mg  2 mL Nebulization BID Rai, Ripudeep K, MD   0.5 mg at 03/07/24 9196   clopidogrel  (PLAVIX ) tablet 75 mg  75 mg Oral Daily Lonni Slain, MD   75 mg at 03/06/24 1104   donepezil  (ARICEPT ) tablet 10 mg  10 mg Oral QHS Rai, Ripudeep K, MD   10 mg at 03/06/24 2129   doxepin  (SINEQUAN ) capsule 10 mg  10 mg Oral QHS Cindy Garnette POUR, MD   10 mg at 03/06/24 2129   insulin  aspart (novoLOG ) injection 0-15 Units  0-15 Units Subcutaneous TID WC Rai, Ripudeep K, MD   5 Units at 03/07/24 0803   insulin  aspart (novoLOG ) injection 0-5 Units  0-5 Units Subcutaneous QHS Rai, Ripudeep K, MD   2 Units at 03/02/24 0043   insulin  aspart (novoLOG ) injection 8 Units  8 Units Subcutaneous TID WC Rai, Ripudeep K, MD   8 Units at 03/07/24 0804   insulin  glargine (LANTUS ) injection 45 Units  45 Units Subcutaneous Daily Cindy Garnette POUR, MD       latanoprost  (XALATAN ) 0.005 % ophthalmic solution 1 drop  1 drop Both Eyes QHS Rai, Ripudeep K, MD   1 drop at 03/06/24 2129   levalbuterol  (XOPENEX ) nebulizer solution 0.63 mg  0.63 mg Nebulization Q6H PRN Rai, Ripudeep K, MD       lidocaine  (LIDODERM ) 5 % 1 patch  1 patch Transdermal Q24H Sundil, Subrina, MD   1 patch at 03/06/24 2130   losartan  (COZAAR ) tablet 25 mg  25 mg Oral Daily Chandrasekhar, Mahesh A, MD   25 mg at 03/06/24 1105   memantine  (NAMENDA ) tablet 10 mg  10 mg Oral BID Rai, Ripudeep K, MD   10 mg at 03/06/24 2129   metoprolol  succinate (TOPROL -XL) 24 hr tablet 50 mg  50 mg Oral QHS Chandrasekhar, Mahesh A, MD   50 mg at 03/06/24 2129   mirabegron  ER (MYRBETRIQ ) tablet 50 mg  50 mg Oral Daily Rai, Ripudeep K, MD   50 mg at 03/06/24 1105   montelukast  (SINGULAIR ) tablet 10 mg  10 mg Oral QHS Rai, Ripudeep K, MD   10 mg at 03/06/24 2129   ondansetron  (ZOFRAN ) tablet 4 mg  4 mg Oral Q6H PRN Lonni Slain, MD       Or   ondansetron  (ZOFRAN ) injection 4 mg  4 mg Intravenous Q6H PRN Lonni Slain, MD   4 mg at 02/27/24 1644   oxybutynin  (DITROPAN -XL) 24 hr tablet 10 mg  10 mg Oral Daily Cindy Garnette POUR, MD   10 mg at 03/06/24 1105   pantoprazole  (PROTONIX ) EC tablet 40 mg  40 mg Oral Daily Cindy Garnette POUR, MD   40 mg at 03/06/24 1105   rivaroxaban  (XARELTO ) tablet 20 mg  20 mg Oral Q supper Ursuy, Renee Lynn, PA-C   20 mg at 03/06/24 1945   sertraline  (ZOLOFT )  tablet 100 mg  100 mg Oral Daily Rai, Ripudeep K, MD   100 mg at 03/06/24 1105   sodium chloride  flush (NS) 0.9 % injection 3 mL  3 mL Intravenous Q12H Ursuy, Renee Lynn, PA-C   3 mL at 03/06/24 2130   spironolactone  (ALDACTONE ) tablet 25 mg  25 mg Oral Daily Rai, Ripudeep K, MD   25 mg at 03/06/24 1106   torsemide  (DEMADEX ) tablet 20 mg  20 mg Oral Daily Santo Stanly LABOR, MD  20 mg at 03/06/24 9140     Discharge Medications: Please see discharge summary for a list of discharge medications.  Relevant Imaging Results:  Relevant Lab Results:   Additional Information SSN [id number]  [name], LCSWA

## 2024-03-07 NOTE — Progress Notes (Signed)
 Physical Therapy Treatment Patient Details Name: Mike Hunter MRN: [id number] DOB: Aug 21, 1950 Today's Date: 03/07/2024   History of Present Illness 73 y/o M adm 02/25/24 with Afib with RVR. 9/12 DCCV. 9/16 ICD placed. 9/19 DCCV. PMHx:  Afib, bipolar d/o, CHF, COPD, BPH, T2DM, HTN, reflux, CAD    PT Comments  Pt very pleasant and eager to progress mobility. Pt with decreased awareness of ROM limitations post ICD with education throughout session, decreased transfers and function with slowly improving gait and activity tolerance. Pt educated for seated HEP. Will continue to follow. Patient will benefit from continued inpatient follow up therapy, <3 hours/day  HR 110-130 SPO2 98% on RA    If plan is discharge home, recommend the following: A little help with bathing/dressing/bathroom;Assistance with cooking/housework;Assist for transportation;Help with stairs or ramp for entrance   Can travel by private vehicle     Yes  Equipment Recommendations  Rollator (4 wheels)    Recommendations for Other Services       Precautions / Restrictions Precautions Precautions: ICD/Pacemaker;Fall Recall of Precautions/Restrictions: Impaired Precaution/Restrictions Comments: education for precautions throughout session     Mobility  Bed Mobility Overal bed mobility: Needs Assistance Bed Mobility: Supine to Sit     Supine to sit: Contact guard     General bed mobility comments: cues for restrictions and safety not to push with LUE    Transfers Overall transfer level: Needs assistance   Transfers: Sit to/from Stand Sit to Stand: Contact guard assist           General transfer comment: cues for hand placement, brake use, safety and precautions to rise from bed, rollator and to recliner    Ambulation/Gait Ambulation/Gait assistance: Contact guard assist Gait Distance (Feet): 110 Feet Assistive device: Rollator (4 wheels) Gait Pattern/deviations: Step-through pattern, Decreased  stride length   Gait velocity interpretation: <1.8 ft/sec, indicate of risk for recurrent falls   General Gait Details: cues for posture and proximity to rollator. Pt with seated rest on rollator prior to return to room with max HR 130. pt walked 110' x 2 trials   Stairs             Wheelchair Mobility     Tilt Bed    Modified Rankin (Stroke Patients Only)       Balance Overall balance assessment: Modified Independent   Sitting balance-Leahy Scale: Fair     Standing balance support: During functional activity, Bilateral upper extremity supported Standing balance-Leahy Scale: Poor                              Communication Communication Communication: No apparent difficulties  Cognition Arousal: Alert Behavior During Therapy: WFL for tasks assessed/performed   PT - Cognitive impairments: Safety/Judgement, Memory                       PT - Cognition Comments: decreased awareness and implimentation of ICD restrictions, cues for locking brakes with rollator use Following commands: Intact      Cueing Cueing Techniques: Verbal cues  Exercises General Exercises - Lower Extremity Long Arc Quad: AROM, Both, 20 reps, Seated, Strengthening Hip Flexion/Marching: AROM, Both, 20 reps, Strengthening, Seated    General Comments        Pertinent Vitals/Pain Pain Assessment Pain Assessment: No/denies pain    Home Living  Prior Function            PT Goals (current goals can now be found in the care plan section) Progress towards PT goals: Progressing toward goals    Frequency    Min 2X/week      PT Plan      Co-evaluation              AM-PAC PT 6 Clicks Mobility   Outcome Measure  Help needed turning from your back to your side while in a flat bed without using bedrails?: A Little Help needed moving from lying on your back to sitting on the side of a flat bed without using bedrails?: A  Little Help needed moving to and from a bed to a chair (including a wheelchair)?: A Little Help needed standing up from a chair using your arms (e.g., wheelchair or bedside chair)?: A Little Help needed to walk in hospital room?: A Little Help needed climbing 3-5 steps with a railing? : A Lot 6 Click Score: 17    End of Session Equipment Utilized During Treatment: Gait belt Activity Tolerance: Patient tolerated treatment well Patient left: in chair;with call bell/phone within reach;with chair alarm set Nurse Communication: Mobility status PT Visit Diagnosis: Other abnormalities of gait and mobility (R26.89);Difficulty in walking, not elsewhere classified (R26.2)     Time: 9050-8985 PT Time Calculation (min) (ACUTE ONLY): 25 min  Charges:    $Gait Training: 8-22 mins $Therapeutic Activity: 8-22 mins PT General Charges $$ ACUTE PT VISIT: 1 Visit                     Mike Hunter, PT Acute Rehabilitation Services Office: [phone]    Mike Hunter 03/07/2024, 11:40 AM

## 2024-03-08 DIAGNOSIS — R079 Chest pain, unspecified: Secondary | ICD-10-CM | POA: Diagnosis not present

## 2024-03-08 DIAGNOSIS — I4891 Unspecified atrial fibrillation: Secondary | ICD-10-CM | POA: Diagnosis not present

## 2024-03-08 DIAGNOSIS — I4892 Unspecified atrial flutter: Secondary | ICD-10-CM | POA: Diagnosis not present

## 2024-03-08 LAB — COMPREHENSIVE METABOLIC PANEL WITH GFR
ALT: 23 U/L (ref 0–44)
AST: 18 U/L (ref 15–41)
Albumin: 2.9 g/dL — ABNORMAL LOW (ref 3.5–5.0)
Alkaline Phosphatase: 84 U/L (ref 38–126)
Anion gap: 10 (ref 5–15)
BUN: 34 mg/dL — ABNORMAL HIGH (ref 8–23)
CO2: 22 mmol/L (ref 22–32)
Calcium: 8.5 mg/dL — ABNORMAL LOW (ref 8.9–10.3)
Chloride: 105 mmol/L (ref 98–111)
Creatinine, Ser: 1.6 mg/dL — ABNORMAL HIGH (ref 0.61–1.24)
GFR, Estimated: 45 mL/min — ABNORMAL LOW (ref >60)
Glucose, Bld: 210 mg/dL — ABNORMAL HIGH (ref 70–99)
Potassium: 3.7 mmol/L (ref 3.5–5.1)
Sodium: 137 mmol/L (ref 135–145)
Total Bilirubin: 0.5 mg/dL (ref 0.0–1.2)
Total Protein: 6.7 g/dL (ref 6.5–8.1)

## 2024-03-08 LAB — CBC
HCT: 34.8 % — ABNORMAL LOW (ref 39.0–52.0)
Hemoglobin: 10.1 g/dL — ABNORMAL LOW (ref 13.0–17.0)
MCH: 21.5 pg — ABNORMAL LOW (ref 26.0–34.0)
MCHC: 29 g/dL — ABNORMAL LOW (ref 30.0–36.0)
MCV: 74.2 fL — ABNORMAL LOW (ref 80.0–100.0)
Platelets: 379 K/uL (ref 150–400)
RBC: 4.69 MIL/uL (ref 4.22–5.81)
RDW: 17.4 % — ABNORMAL HIGH (ref 11.5–15.5)
WBC: 10.9 K/uL — ABNORMAL HIGH (ref 4.0–10.5)
nRBC: 0 % (ref 0.0–0.2)

## 2024-03-08 LAB — GLUCOSE, CAPILLARY
Glucose-Capillary: 171 mg/dL — ABNORMAL HIGH (ref 70–99)
Glucose-Capillary: 226 mg/dL — ABNORMAL HIGH (ref 70–99)
Glucose-Capillary: 125 mg/dL — ABNORMAL HIGH (ref 70–99)
Glucose-Capillary: 186 mg/dL — ABNORMAL HIGH (ref 70–99)

## 2024-03-08 NOTE — Plan of Care (Signed)
 Problem: Education: Goal: Knowledge of General Education information will improve Description: Including pain rating scale, medication(s)/side effects and non-pharmacologic comfort measures Outcome: Progressing   Problem: Health Behavior/Discharge Planning: Goal: Ability to manage health-related needs will improve Outcome: Progressing   Problem: Clinical Measurements: Goal: Ability to maintain clinical measurements within normal limits will improve Outcome: Progressing Goal: Will remain free from infection Outcome: Progressing Goal: Diagnostic test results will improve Outcome: Progressing Goal: Respiratory complications will improve Outcome: Progressing Goal: Cardiovascular complication will be avoided Outcome: Progressing   Problem: Activity: Goal: Risk for activity intolerance will decrease Outcome: Progressing   Problem: Nutrition: Goal: Adequate nutrition will be maintained Outcome: Progressing   Problem: Coping: Goal: Level of anxiety will decrease Outcome: Progressing   Problem: Elimination: Goal: Will not experience complications related to bowel motility Outcome: Progressing Goal: Will not experience complications related to urinary retention Outcome: Progressing   Problem: Pain Managment: Goal: General experience of comfort will improve and/or be controlled Outcome: Progressing   Problem: Safety: Goal: Ability to remain free from injury will improve Outcome: Progressing   Problem: Skin Integrity: Goal: Risk for impaired skin integrity will decrease Outcome: Progressing   Problem: Education: Goal: Knowledge of disease or condition will improve Outcome: Progressing Goal: Understanding of medication regimen will improve Outcome: Progressing Goal: Individualized Educational Video(s) Outcome: Progressing   Problem: Activity: Goal: Ability to tolerate increased activity will improve Outcome: Progressing   Problem: Cardiac: Goal: Ability to achieve  and maintain adequate cardiopulmonary perfusion will improve Outcome: Progressing   Problem: Health Behavior/Discharge Planning: Goal: Ability to safely manage health-related needs after discharge will improve Outcome: Progressing   Problem: Education: Goal: Ability to describe self-care measures that may prevent or decrease complications (Diabetes Survival Skills Education) will improve Outcome: Progressing Goal: Individualized Educational Video(s) Outcome: Progressing   Problem: Coping: Goal: Ability to adjust to condition or change in health will improve Outcome: Progressing   Problem: Fluid Volume: Goal: Ability to maintain a balanced intake and output will improve Outcome: Progressing   Problem: Health Behavior/Discharge Planning: Goal: Ability to identify and utilize available resources and services will improve Outcome: Progressing Goal: Ability to manage health-related needs will improve Outcome: Progressing   Problem: Metabolic: Goal: Ability to maintain appropriate glucose levels will improve Outcome: Progressing   Problem: Nutritional: Goal: Maintenance of adequate nutrition will improve Outcome: Progressing Goal: Progress toward achieving an optimal weight will improve Outcome: Progressing   Problem: Skin Integrity: Goal: Risk for impaired skin integrity will decrease Outcome: Progressing   Problem: Tissue Perfusion: Goal: Adequacy of tissue perfusion will improve Outcome: Progressing   Problem: Education: Goal: Ability to describe self-care measures that may prevent or decrease complications (Diabetes Survival Skills Education) will improve Outcome: Progressing Goal: Individualized Educational Video(s) Outcome: Progressing   Problem: Coping: Goal: Ability to adjust to condition or change in health will improve Outcome: Progressing   Problem: Fluid Volume: Goal: Ability to maintain a balanced intake and output will improve Outcome: Progressing    Problem: Health Behavior/Discharge Planning: Goal: Ability to identify and utilize available resources and services will improve Outcome: Progressing Goal: Ability to manage health-related needs will improve Outcome: Progressing   Problem: Metabolic: Goal: Ability to maintain appropriate glucose levels will improve Outcome: Progressing   Problem: Nutritional: Goal: Maintenance of adequate nutrition will improve Outcome: Progressing Goal: Progress toward achieving an optimal weight will improve Outcome: Progressing   Problem: Skin Integrity: Goal: Risk for impaired skin integrity will decrease Outcome: Progressing   Problem: Tissue Perfusion: Goal: Adequacy  of tissue perfusion will improve Outcome: Progressing   Problem: Education: Goal: Knowledge of cardiac device and self-care will improve Outcome: Progressing Goal: Ability to safely manage health related needs after discharge will improve Outcome: Progressing Goal: Individualized Educational Video(s) Outcome: Progressing

## 2024-03-08 NOTE — Progress Notes (Signed)
 Mobility Specialist Progress Note:   03/08/24 1400  Mobility  Activity Ambulated with assistance  Level of Assistance Contact guard assist, steadying assist  Assistive Device Four wheel walker  Distance Ambulated (ft) 100 ft ((+100))  Activity Response Tolerated well  Mobility Referral Yes  Mobility visit 1 Mobility  Mobility Specialist Start Time (ACUTE ONLY) 1400  Mobility Specialist Stop Time (ACUTE ONLY) 1420  Mobility Specialist Time Calculation (min) (ACUTE ONLY) 20 min   Pt eager for mobility session. Required contact assist for safety with rollator. X1 seated rest break taken d/t fatigue. VSS on RA. HR 110s. Pt back in chair with all needs met.   Therisa Rana Mobility Specialist Please contact via SecureChat or  Rehab office at [phone]

## 2024-03-08 NOTE — TOC Progression Note (Addendum)
 Transition of Care [facility]) - Progression Note    Patient Details  Name: Mike Hunter MRN: [id number] Date of Birth: 04-Mar-1951  Transition of Care [facility]) CM/SW Contact  [name], [place] Phone Number: 03/08/2024, 8:39 AM  Clinical Narrative:   Shara still pending at this time.   12:19 PM Per CMA, insurance requested updated therapy notes for clinical review.    4:25 PM CSW reviewed shara and it is currently still pending.   CSW will continue to follow.    Expected Discharge Plan: Skilled Nursing Facility Barriers to Discharge: Insurance Authorization               Expected Discharge Plan and Services In-house Referral: NA Discharge Planning Services: CM Consult Post Acute Care Choice: NA Living arrangements for the past 2 months: Single Family Home                 DME Arranged: N/A DME Agency: NA       HH Arranged: NA HH Agency: NA         Social Drivers of Health (SDOH) Interventions SDOH Screenings   Food Insecurity: No Food Insecurity (02/26/2024)  Housing: Low Risk  (02/26/2024)  Transportation Needs: No Transportation Needs (02/26/2024)  Utilities: Not At Risk (02/26/2024)  Social Connections: Socially Integrated (02/26/2024)  Tobacco Use: Medium Risk (02/26/2024)    Readmission Risk Interventions    02/29/2024    3:28 PM 12/31/2023   10:58 AM  Readmission Risk Prevention Plan  Transportation Screening Complete Complete  Medication Review Oceanographer) Complete Referral to Pharmacy  HRI or Home Care Consult Complete Complete  SW Recovery Care/Counseling Consult  Complete  Palliative Care Screening Not Applicable Not Applicable  Skilled Nursing Facility Not Applicable Not Applicable

## 2024-03-08 NOTE — Progress Notes (Signed)
 Progress Note   Patient: Mike Hunter FMW:[id number] DOB: 07/04/1950 DOA: 02/25/2024     11 DOS: the patient was seen and examined on 03/08/2024   Brief hospital course: 73 year old male with paroxysmal atrial fibrillation on Xarelto , chronic HFpEF, COPD, CKD stage IIIb diabetes mellitus type 2, HTN, osteoporosis, CAD status post PCI, obesity, OSA on CPAP presented to ED with palpitations for 1 week.  He also reported chest pain, pressure with radiation down the left arm, associated shortness of breath, prior to presenting to ED, heart rate was in 130s In ED, patient was found to have heart rate 131, BP 105/53 and was placed on Cardizem  drip cardiology was consulted.  Assessment and Plan: Principal Problem:   Atrial fibrillation with rapid ventricular response (HCC) - Initially placed on Cardizem  drip  - Torsemide , Aldactone , metoprolol , Imdur  were held due to borderline BP - 2D echo showed EF of 45 to 50%, global hypokinesis, diastolic parameters indeterminate -  DCCV on 9/12 but had a reccurrence of AF postprocedure on 9/13, started on amiodarone  drip by cardiology - EP cardiology consulted, continued amiodarone  - Pt now failed attempt at DCCV 9/19 -cont with anticoag with beta blocker, amiodarone . Cardiology plan cardioversion 3-4 weeks after rehab -Planning for SNF, TOC continues to follow   Wide-complex tachycardia, hemodynamically unstable, chest pain - On 9/13, patient had sudden onset of V. tach, rate 180s to 190s, seen by cardiology urgently and was given IV amiodarone  bolus x2, placed on amiodarone  drip - EP cardiology following, now s/p ICD on 9/16   Acute on chronic systolic and diastolic heart failure with volume overload - Noted to be in volume overload on 9/13, torsemide  and Aldactone  had been held on the admission due to borderline BP - Was on IV lasix , now on torsemide  BID per Cardiology   CAD status post PCI,  hypertension -Previous echo 12/2023 had shown EF of 55 to  60%, indeterminate left ventricular diastolic parameters, global hypokinesis, moderate asymmetric LVH of the septal segment.  Mild to moderate aortic stenosis - Continue Plavix , cardiology following    COPD with mild exacerbation Leukocytosis, acute bronchitis -Improving, continue Pulmicort , Brovana , Xopenex  nebs -Troponin 0.71 -Completed IV Unasyn  9/20      Insulin -requiring or dependent type II diabetes mellitus (HCC), uncontrolled with hyperglycemia  -On high doses of insulin  outpatient, last A1c 6.7 -CBG still uncontrolled, increased Lantus  to 45 units daily, NovoLog  8 units 3 times daily AC, moderate SSI -Diabetic coordinator consult     Anemia due to stage 3b chronic kidney disease (HCC) -H&H currently stable   Mild acute on CKD stage IIIb - Baseline creatinine 1.4-1.7 likely worsened due to A-fib with RVR, now on diuresis  - Creatinine currently up to 1.8 -May have to hold next dose of torsemide  if Cr remains elevated   Hyperlipidemia - Resume statin    Obesity class II Estimated body mass index is 39.48 kg/m as calculated from the following:   Height as of this encounter: 5' 10 (1.778 m).   Weight as of this encounter: 124.8 kg.       Subjective: No chest pain or sob  Physical Exam: Vitals:   03/08/24 0622 03/08/24 0734 03/08/24 0812 03/08/24 1135  BP: 123/78 109/86  (!) 93/57  Pulse: 100  93   Resp: 20 16  20   Temp: 98.2 F (36.8 C) 98.1 F (36.7 C)  98 F (36.7 C)  TempSrc: Oral Oral  Oral  SpO2: 99% 99% 99% 100%  Weight:  Height:       General exam: Awake, laying in bed, in nad Respiratory system: Normal respiratory effort, no wheezing Cardiovascular system: regular rate, s1, s2 Gastrointestinal system: Soft, nondistended, positive BS Central nervous system: CN2-12 grossly intact, strength intact Extremities: Perfused, no clubbing Skin: Normal skin turgor, no notable skin lesions seen Psychiatry: Mood normal // no visual hallucinations    Data Reviewed:  Labs reviewed: Na 137, K 3.7, Cr 1.60, WBC 10.9, Hgb 10.1, Plts 379   Family Communication: Pt in room, family not at bedside  Disposition: Status is: Inpatient Remains inpatient appropriate because: severity of illness  Planned Discharge Destination: Skilled nursing facility     Author: Garnette Pelt, MD 03/08/2024 3:40 PM  For on call review [email or web address].

## 2024-03-09 DIAGNOSIS — D631 Anemia in chronic kidney disease: Secondary | ICD-10-CM | POA: Diagnosis not present

## 2024-03-09 DIAGNOSIS — I4891 Unspecified atrial fibrillation: Secondary | ICD-10-CM | POA: Diagnosis not present

## 2024-03-09 DIAGNOSIS — N1832 Chronic kidney disease, stage 3b: Secondary | ICD-10-CM | POA: Diagnosis not present

## 2024-03-09 LAB — COMPREHENSIVE METABOLIC PANEL WITH GFR
ALT: 22 U/L (ref 0–44)
AST: 17 U/L (ref 15–41)
Albumin: 2.9 g/dL — ABNORMAL LOW (ref 3.5–5.0)
Alkaline Phosphatase: 84 U/L (ref 38–126)
Anion gap: 11 (ref 5–15)
BUN: 33 mg/dL — ABNORMAL HIGH (ref 8–23)
CO2: 22 mmol/L (ref 22–32)
Calcium: 8.5 mg/dL — ABNORMAL LOW (ref 8.9–10.3)
Chloride: 103 mmol/L (ref 98–111)
Creatinine, Ser: 1.65 mg/dL — ABNORMAL HIGH (ref 0.61–1.24)
GFR, Estimated: 43 mL/min — ABNORMAL LOW (ref >60)
Glucose, Bld: 190 mg/dL — ABNORMAL HIGH (ref 70–99)
Potassium: 3.5 mmol/L (ref 3.5–5.1)
Sodium: 136 mmol/L (ref 135–145)
Total Bilirubin: 0.7 mg/dL (ref 0.0–1.2)
Total Protein: 6.4 g/dL — ABNORMAL LOW (ref 6.5–8.1)

## 2024-03-09 LAB — GLUCOSE, CAPILLARY
Glucose-Capillary: 164 mg/dL — ABNORMAL HIGH (ref 70–99)
Glucose-Capillary: 191 mg/dL — ABNORMAL HIGH (ref 70–99)

## 2024-03-09 LAB — CBC
HCT: 34.3 % — ABNORMAL LOW (ref 39.0–52.0)
Hemoglobin: 10.2 g/dL — ABNORMAL LOW (ref 13.0–17.0)
MCH: 22 pg — ABNORMAL LOW (ref 26.0–34.0)
MCHC: 29.7 g/dL — ABNORMAL LOW (ref 30.0–36.0)
MCV: 74.1 fL — ABNORMAL LOW (ref 80.0–100.0)
Platelets: 367 K/uL (ref 150–400)
RBC: 4.63 MIL/uL (ref 4.22–5.81)
RDW: 17.6 % — ABNORMAL HIGH (ref 11.5–15.5)
WBC: 11.3 K/uL — ABNORMAL HIGH (ref 4.0–10.5)
nRBC: 0 % (ref 0.0–0.2)

## 2024-03-09 MED ORDER — LOSARTAN POTASSIUM 25 MG PO TABS: 25.0000 mg | ORAL_TABLET | Freq: Every day | ORAL | 2 refills | Status: AC

## 2024-03-09 MED ORDER — AMIODARONE HCL 200 MG PO TABS: 200.0000 mg | ORAL_TABLET | Freq: Two times a day (BID) | ORAL | 3 refills | Status: DC

## 2024-03-09 MED ORDER — METOPROLOL SUCCINATE ER 50 MG PO TB24: 50.0000 mg | ORAL_TABLET | Freq: Every day | ORAL | 2 refills | Status: DC

## 2024-03-09 MED ORDER — TORSEMIDE 20 MG PO TABS: 20.0000 mg | ORAL_TABLET | Freq: Every day | ORAL | Status: AC

## 2024-03-09 NOTE — TOC Progression Note (Addendum)
 Transition of Care [facility]) - Progression Note    Patient Details  Name: Mike Hunter MRN: [id number] Date of Birth: Jan 06, 1951  Transition of Care [facility]) CM/SW Contact  [name], [place] Phone Number: 03/09/2024, 8:35 AM  Clinical Narrative:  Insurance is offering a Peer to Peer due by 12:00 noon today 9/24. The Phone # [phone] option #3, Appeals # [id number] Fax # [phone]. The patient's [employer] ID: [id number]. CSW notified MD.   9:14AM Per MD, peer to peer completed. Insurance denied SNF for rehab at this time. CSW to communicate denial to patient and inform patient of right to appeal.   10:32 AM Per RN, patients wife is requesting to speak with CSW. CSW spoke with patients wife, Winton, about insurance auth denial for SNF. CSW explained that patient has the right to appeal insurance denial. Winton stated she did not want to do an appeal at this time and wants to take patient home. Peggy stated she is going to call patient and tell him to get dressed. CSW inquired if Winton would like RNCM to set patient up with Home Health. Peggy stated yes and that patient has had HH through [facility] before. CSW communicated above conversation with treatment team.   CSW will continue to follow.    Expected Discharge Plan: Skilled Nursing Facility Barriers to Discharge: Insurance Authorization               Expected Discharge Plan and Services In-house Referral: NA Discharge Planning Services: CM Consult Post Acute Care Choice: NA Living arrangements for the past 2 months: Single Family Home                 DME Arranged: N/A DME Agency: NA       HH Arranged: NA HH Agency: NA         Social Drivers of Health (SDOH) Interventions SDOH Screenings   Food Insecurity: No Food Insecurity (02/26/2024)  Housing: Low Risk  (02/26/2024)  Transportation Needs: No Transportation Needs (02/26/2024)  Utilities: Not At Risk (02/26/2024)  Social Connections: Socially Integrated  (02/26/2024)  Tobacco Use: Medium Risk (02/26/2024)    Readmission Risk Interventions    02/29/2024    3:28 PM 12/31/2023   10:58 AM  Readmission Risk Prevention Plan  Transportation Screening Complete Complete  Medication Review Oceanographer) Complete Referral to Pharmacy  HRI or Home Care Consult Complete Complete  SW Recovery Care/Counseling Consult  Complete  Palliative Care Screening Not Applicable Not Applicable  Skilled Nursing Facility Not Applicable Not Applicable

## 2024-03-09 NOTE — Care Management Important Message (Signed)
 Important Message  Patient Details  Name: Mike Hunter MRN: [id number] Date of Birth: Dec 10, 1950   Important Message Given:  Yes - Medicare IM     Vonzell Arrie Sharps 03/09/2024, 11:55 AM

## 2024-03-09 NOTE — Progress Notes (Signed)
 DISCHARGE NOTE HOME Mike Hunter to be discharged Home per MD order. Discussed prescriptions and follow up appointments with the patient. Prescriptions given to patient; medication list explained in detail. Patient verbalized understanding.  Skin clean, dry and intact without evidence of skin break down, no evidence of skin tears noted. IV catheter discontinued intact. Site without signs and symptoms of complications. Dressing and pressure applied. Pt denies pain at the site currently. No complaints noted.  Patient free of lines, drains, and wounds.   An After Visit Summary (AVS) was printed and given to the patient. Patient escorted via wheelchair, and discharged home via private auto.  Peyton SHAUNNA Pepper, RN

## 2024-03-09 NOTE — Discharge Summary (Signed)
 Physician Discharge Summary   Briston Lax Stinnette FMW:[id number] DOB: Aug 27, 1950 DOA: 02/25/2024  PCP: Nikki Hansel Atlas, MD  Admit date: 02/25/2024 Discharge date: 03/09/2024  Admitted From: Home Disposition:  Home Discharging physician: Alm Apo, MD Barriers to discharge: none  Recommendations at discharge: Follow up with cardiology for repeat DCCV Repeat BMP at follow up   Home Health: PT, OT  Discharge Condition: stable CODE STATUS: Full  Diet recommendation:  Diet Orders (From admission, onward)     Start     Ordered   03/09/24 0000  Diet - low sodium heart healthy        03/09/24 1039   03/04/24 1501  Diet heart healthy/carb modified Room service appropriate? Yes; Fluid consistency: Thin  Diet effective now       Question Answer Comment  Diet-HS Snack? Nothing   Room service appropriate? Yes   Fluid consistency: Thin      03/04/24 1500            Hospital Course: 73 year old male with paroxysmal atrial fibrillation on Xarelto , chronic HFpEF, COPD, CKD stage IIIb diabetes mellitus type 2, HTN, osteoporosis, CAD status post PCI, obesity, OSA on CPAP presented to ED with palpitations for 1 week.  He also reported chest pain, pressure with radiation down the left arm, associated shortness of breath, prior to presenting to ED, heart rate was in 130s In ED, patient was found to have heart rate 131, BP 105/53 and was placed on Cardizem  drip cardiology was consulted.  Assessment and Plan:    Atrial fibrillation with rapid ventricular response (HCC) - Initially placed on Cardizem  drip  - Torsemide , Aldactone , metoprolol , Imdur  were held due to borderline BP - 2D echo showed EF of 45 to 50%, global hypokinesis, diastolic parameters indeterminate -  DCCV on 9/12 but had a reccurrence of AF postprocedure on 9/13, started on amiodarone  drip by cardiology - EP cardiology consulted, continued amiodarone  - Pt now failed attempt at DCCV 9/19 -cont with anticoag with beta  blocker, amiodarone . Cardiology plan cardioversion 3-4 weeks - Declined from SNF and wife declined appealing -Patient discharging home with home health   Wide-complex tachycardia, hemodynamically unstable, chest pain - On 9/13, patient had sudden onset of V. tach, rate 180s to 190s, seen by cardiology urgently and was given IV amiodarone  bolus x2, placed on amiodarone  drip - EP cardiology following, now s/p ICD on 9/16   Acute on chronic systolic and diastolic heart failure with volume overload - Noted to be in volume overload on 9/13, torsemide  and Aldactone  had been held on the admission due to borderline BP - Was on IV lasix , now on torsemide  BID per Cardiology   CAD status post PCI,  hypertension -Previous echo 12/2023 had shown EF of 55 to 60%, indeterminate left ventricular diastolic parameters, global hypokinesis, moderate asymmetric LVH of the septal segment.  Mild to moderate aortic stenosis - Continue Plavix     COPD with mild exacerbation Leukocytosis, acute bronchitis -Improving, continue Pulmicort , Brovana , Xopenex  nebs -Troponin 0.71 -Completed IV Unasyn  9/20    Insulin -requiring or dependent type II diabetes mellitus (HCC), uncontrolled with hyperglycemia  - Resume home regimen   Anemia due to stage 3b chronic kidney disease (HCC) -H&H currently stable   Mild acute on CKD stage IIIb - Baseline creatinine 1.4-1.7 likely worsened due to A-fib with RVR, now on diuresis    Hyperlipidemia - Resume statin    Obesity class II Estimated body mass index is 39.48 kg/m as calculated from the following:  Height as of this encounter: 5' 10 (1.778 m).   Weight as of this encounter: 124.8 kg.   The patient's acute and chronic medical conditions were treated accordingly. On day of discharge, patient was felt deemed stable for discharge. Patient/family member advised to call PCP or come back to ER if needed.   Principal Diagnosis: Atrial fibrillation with rapid ventricular  response [facility])  Discharge Diagnoses: Active Hospital Problems   Diagnosis Date Noted   Atrial fibrillation with rapid ventricular response (HCC) 08/24/2023    Priority: 1.   Pressure injury of skin 02/26/2024   Chest pain 02/25/2024   Anemia due to stage 3b chronic kidney disease (HCC) 08/01/2021   CAD (coronary artery disease) 09/26/2018   Insulin -requiring or dependent type II diabetes mellitus (HCC) 12/30/2012    Resolved Hospital Problems  No resolved problems to display.     Discharge Instructions     Amb referral to AFIB Clinic   Complete by: As directed    Diet - low sodium heart healthy   Complete by: As directed    Increase activity slowly   Complete by: As directed    No wound care   Complete by: As directed       Allergies as of 03/09/2024       Reactions   Buprenorphine Hcl Nausea And Vomiting   Other reaction(s): Nausea And Vomiting   Hydromorphone  Other (See Comments)   Other reaction(s): Mental Status Changes (intolerance) Other reaction(s): HALLUCINATIONS hallucinations hallucinations hallucinations hallucinations   Codeine Nausea And Vomiting   Mirtazapine    Other reaction(s): Other (See Comments) Nightmares & altered mental status   Morphine  And Codeine Nausea And Vomiting        Medication List     STOP taking these medications    dextromethorphan -guaiFENesin  30-600 MG 12hr tablet Commonly known as: MUCINEX  DM   isosorbide  mononitrate 30 MG 24 hr tablet Commonly known as: IMDUR    metoprolol  tartrate 25 MG tablet Commonly known as: LOPRESSOR        TAKE these medications    albuterol  108 (90 Base) MCG/ACT inhaler Commonly known as: VENTOLIN  HFA Inhale 2 puffs into the lungs every 4 (four) hours as needed for wheezing.   amiodarone  200 MG tablet Commonly known as: PACERONE  Take 1 tablet (200 mg total) by mouth 2 (two) times daily.   atorvastatin  80 MG tablet Commonly known as: LIPITOR  Take 1 tablet (80 mg total) by mouth  daily.   azithromycin  250 MG tablet Commonly known as: ZITHROMAX  Take 250 mg by mouth 3 (three) times a week.   Belsomra 15 MG Tabs Generic drug: Suvorexant Take 1 tablet by mouth at bedtime.   budesonide  0.5 MG/2ML nebulizer solution Commonly known as: PULMICORT  Take 2 mLs by nebulization 2 (two) times daily.   clopidogrel  75 MG tablet Commonly known as: PLAVIX  Take 75 mg by mouth daily.   donepezil  10 MG tablet Commonly known as: ARICEPT  Take 10 mg by mouth at bedtime.   iron polysaccharides 150 MG capsule Commonly known as: NIFEREX Take 150 mg by mouth 2 (two) times daily.   losartan  25 MG tablet Commonly known as: COZAAR  Take 1 tablet (25 mg total) by mouth daily.   memantine  10 MG tablet Commonly known as: NAMENDA  Take 10 mg by mouth 2 (two) times daily.   metoprolol  succinate 50 MG 24 hr tablet Commonly known as: TOPROL -XL Take 1 tablet (50 mg total) by mouth at bedtime. Take with or immediately following a meal.  mirabegron  ER 50 MG Tb24 tablet Commonly known as: MYRBETRIQ  Take 50 mg by mouth daily.   montelukast  10 MG tablet Commonly known as: SINGULAIR  Take 10 mg by mouth at bedtime.   multivitamin with minerals Tabs tablet Take 1 tablet by mouth daily.   NovoLOG  FlexPen 100 UNIT/ML injection Generic drug: insulin  aspart Inject 26-44 Units into the skin See admin instructions. 5 units prior to meals for cbg>= 150  Sliding scale (base is 26u in the AM, 30u at noon, and  36-44 at night   omeprazole 40 MG capsule Commonly known as: PRILOSEC Take 1 capsule by mouth at bedtime.   oxybutynin  10 MG 24 hr tablet Commonly known as: DITROPAN -XL Take 10 mg by mouth daily.   PRESERVISION AREDS PO Take 2 tablets by mouth daily.   sertraline  100 MG tablet Commonly known as: ZOLOFT  Take 100 mg by mouth daily.   spironolactone  25 MG tablet Commonly known as: ALDACTONE  Take 25 mg by mouth daily.   Teriparatide  560 MCG/2.24ML Sopn Inject 2.24 mLs into  the skin at bedtime. What changed: Another medication with the same name was removed. Continue taking this medication, and follow the directions you see here.   torsemide  20 MG tablet Commonly known as: DEMADEX  Take 1 tablet (20 mg total) by mouth daily. What changed: when to take this   TRESIBA  FLEXTOUCH  Inject 40-46 Units into the skin See admin instructions. 40 morning, 46 night   Wixela Inhub 250-50 MCG/ACT Aepb Generic drug: fluticasone -salmeterol Inhale 1 puff into the lungs in the morning and at bedtime.   Xalatan  0.005 % ophthalmic solution Generic drug: latanoprost  Place 1 drop into both eyes at bedtime.   Xarelto  20 MG Tabs tablet Generic drug: rivaroxaban  Take 20 mg by mouth daily with supper.        Contact information for follow-up providers     [name], [name], MD Follow up on 03/16/2024.   Specialty: Family Medicine Why: 10 am for hospital follow up Contact information: [address] [place] [place] [address] [phone]         Lucien Orren SAILOR, PA-C Follow up.   Specialty: Cardiology Why: [facility] - [address] location - cardiology follow-up arranged Monday Mar 14, 2024 8:25 AM (Arrive by 8:05 AM). Orren is one of the PAs with our cardiology team. Contact information: [address] [place] [address] [phone]         [facility] Follow up.   Why: Agency will call you to set up apt times Contact information: [address] [phone]             Contact information for after-discharge care     Destination     [facility] .   Service: Skilled Nursing Contact information: [address] [place]  [address] [phone]                    Allergies  Allergen Reactions   Buprenorphine Hcl Nausea And Vomiting    Other reaction(s): Nausea And Vomiting    Hydromorphone  Other (See Comments)    Other reaction(s): Mental Status Changes  (intolerance) Other reaction(s): HALLUCINATIONS hallucinations hallucinations hallucinations hallucinations    Codeine Nausea And Vomiting   Mirtazapine     Other reaction(s): Other (See Comments) Nightmares & altered mental status    Morphine  And Codeine Nausea And Vomiting    Consultations: Cardiology    Discharge Exam: BP 122/64 (BP Location:  Right Arm)   Pulse (!) 108   Temp 98.2 F (36.8 C) (Oral)   Resp 18   Ht 5' 10 (1.778 m)   Wt 118.9 kg   SpO2 98%   BMI 37.61 kg/m  Physical Exam Constitutional:      General: He is not in acute distress.    Appearance: Normal appearance. He is obese.  HENT:     Head: Normocephalic and atraumatic.     Mouth/Throat:     Mouth: Mucous membranes are moist.  Eyes:     Extraocular Movements: Extraocular movements intact.  Cardiovascular:     Rate and Rhythm: Normal rate. Rhythm irregular.  Pulmonary:     Effort: Pulmonary effort is normal. No respiratory distress.     Breath sounds: Normal breath sounds. No wheezing.  Abdominal:     General: Bowel sounds are normal. There is no distension.     Palpations: Abdomen is soft.     Tenderness: There is no abdominal tenderness.  Musculoskeletal:        General: Normal range of motion.     Cervical back: Normal range of motion and neck supple.  Skin:    General: Skin is warm and dry.  Neurological:     General: No focal deficit present.     Mental Status: He is alert.  Psychiatric:        Mood and Affect: Mood normal.        Behavior: Behavior normal.      The results of significant diagnostics from this hospitalization (including imaging, microbiology, ancillary and laboratory) are listed below for reference.   Microbiology: No results found for this or any previous visit (from the past 240 hours).   Labs: BNP (last 3 results) Recent Labs    06/06/23 1218 08/24/23 1431 02/27/24 1103  BNP 485.2* 177.1* 222.0*   Basic Metabolic Panel: Recent Labs  Lab  03/05/24 0249 03/05/24 0845 03/06/24 0228 03/07/24 0316 03/08/24 0228 03/09/24 0340  NA 138  --  137 137 137 136  K 3.3*  --  3.6 3.9 3.7 3.5  CL 102  --  105 104 105 103  CO2 22  --  23 23 22 22   GLUCOSE 181*  --  249* 177* 210* 190*  BUN 35*  --  39* 36* 34* 33*  CREATININE 1.70*  --  1.84* 1.81* 1.60* 1.65*  CALCIUM  8.5*  --  8.5* 8.4* 8.5* 8.5*  MG  --  1.8 2.3 2.3  --   --    Liver Function Tests: Recent Labs  Lab 03/05/24 0249 03/06/24 0228 03/07/24 0316 03/08/24 0228 03/09/24 0340  AST 17 20 17 18 17   ALT 21 21 22 23 22   ALKPHOS 84 85 81 84 84  BILITOT 0.6 0.5 0.6 0.5 0.7  PROT 6.8 6.8 6.5 6.7 6.4*  ALBUMIN  2.9* 2.8* 2.8* 2.9* 2.9*   No results for input(s): LIPASE, AMYLASE in the last 168 hours. No results for input(s): AMMONIA in the last 168 hours. CBC: Recent Labs  Lab 03/04/24 0235 03/05/24 0249 03/07/24 0316 03/08/24 0228 03/09/24 0340  WBC 9.8 11.6* 11.1* 10.9* 11.3*  HGB 10.2* 10.1* 10.2* 10.1* 10.2*  HCT 34.7* 34.2* 34.5* 34.8* 34.3*  MCV 74.0* 73.7* 74.2* 74.2* 74.1*  PLT 319 368 377 379 367   Cardiac Enzymes: No results for input(s): CKTOTAL, CKMB, CKMBINDEX, TROPONINI in the last 168 hours. BNP: Invalid input(s): POCBNP CBG: Recent Labs  Lab 03/08/24 1132 03/08/24 1620 03/08/24 2123 03/09/24  9376 03/09/24 1138  GLUCAP 226* 125* 186* 164* 191*   D-Dimer No results for input(s): DDIMER in the last 72 hours. Hgb A1c No results for input(s): HGBA1C in the last 72 hours. Lipid Profile No results for input(s): CHOL, HDL, LDLCALC, TRIG, CHOLHDL, LDLDIRECT in the last 72 hours. Thyroid function studies No results for input(s): TSH, T4TOTAL, T3FREE, THYROIDAB in the last 72 hours.  Invalid input(s): FREET3 Anemia work up No results for input(s): VITAMINB12, FOLATE, FERRITIN, TIBC, IRON, RETICCTPCT in the last 72 hours. Urinalysis    Component Value Date/Time   COLORURINE STRAW  (A) 02/27/2024 1027   APPEARANCEUR CLEAR 02/27/2024 1027   LABSPEC 1.005 02/27/2024 1027   PHURINE 7.0 02/27/2024 1027   GLUCOSEU >=500 (A) 02/27/2024 1027   HGBUR NEGATIVE 02/27/2024 1027   BILIRUBINUR NEGATIVE 02/27/2024 1027   KETONESUR NEGATIVE 02/27/2024 1027   PROTEINUR NEGATIVE 02/27/2024 1027   UROBILINOGEN 0.2 11/01/2014 1530   NITRITE NEGATIVE 02/27/2024 1027   LEUKOCYTESUR NEGATIVE 02/27/2024 1027   Sepsis Labs Recent Labs  Lab 03/05/24 0249 03/07/24 0316 03/08/24 0228 03/09/24 0340  WBC 11.6* 11.1* 10.9* 11.3*   Microbiology No results found for this or any previous visit (from the past 240 hours).  Procedures/Studies: DG CHEST PORT 1 VIEW Result Date: 03/05/2024 CLINICAL DATA:  Pneumonia EXAM: PORTABLE CHEST 1 VIEW COMPARISON:  Chest x-ray 03/02/2024. FINDINGS: Left-sided pacemaker is again seen. The heart is enlarged, unchanged. The lungs are clear. There is no pleural effusion or pneumothorax. No acute fractures are seen. IMPRESSION: 1. No active disease. 2. Cardiomegaly. Electronically Signed   By: Greig Pique M.D.   On: 03/05/2024 18:25   ECHO TEE Result Date: 03/04/2024    TRANSESOPHOGEAL ECHO REPORT   Patient Name:   Kazia Grisanti C Meacham Date of Exam: 03/04/2024 Medical Rec #:  [id number]     Height:       70.0 in Accession #:    [id number]    Weight:       275.1 lb Date of Birth:  1951-03-14      BSA:          2.390 m Patient Age:    73 years      BP:           102/70 mmHg Patient Gender: M             HR:           103 bpm. Exam Location:  Inpatient Procedure: Color Doppler, Cardiac Doppler and Transesophageal Echo (Both            Spectral and Color Flow Doppler were utilized during procedure). Indications:    Cardioversion  History:        Patient has prior history of Echocardiogram examinations, most                 recent 02/26/2024.  Sonographer:    Tinnie Gosling RDCS Referring Phys: [phone] MIHAI CROITORU PROCEDURE: After discussion of the risks and benefits of a TEE, an  informed consent was obtained. The transesophogeal probe was passed without difficulty through the esophogus of the patient. Sedation performed by different physician. The patient was monitored while under deep sedation. Anesthestetic sedation was provided intravenously by Anesthesiology: 159mg  of Propofol , 60mg  of Lidocaine . The patient developed no complications during the procedure.  IMPRESSIONS  1. Left ventricular ejection fraction, by estimation, is 35 to 40%. The left ventricle has moderately decreased function. The left ventricle demonstrates global hypokinesis. Left ventricular diastolic  function could not be evaluated.  2. Right ventricular systolic function is mildly reduced. The right ventricular size is normal. The estimated right ventricular systolic pressure is 35.0 mmHg.  3. Left atrial size was moderately dilated. No left atrial/left atrial appendage thrombus was detected. The LAA emptying velocity was 23 cm/s.  4. Right atrial size was mildly dilated.  5. The mitral valve is normal in structure. Trivial mitral valve regurgitation. No evidence of mitral stenosis.  6. Possibly a functionally bicuspid aortic valve. There is a calcified raphe between the left and noncoronary cusps. The aortic valve is abnormal. There is mild calcification of the aortic valve. There is mild thickening of the aortic valve. Aortic valve regurgitation is not visualized. Aortic valve sclerosis/calcification is present, without any evidence of aortic stenosis.  7. There is Moderate (Grade III) plaque involving the descending aorta. FINDINGS  Left Ventricle: Left ventricular ejection fraction, by estimation, is 35 to 40%. The left ventricle has moderately decreased function. The left ventricle demonstrates global hypokinesis. The left ventricular internal cavity size was normal in size. There is no left ventricular hypertrophy. Left ventricular diastolic function could not be evaluated due to atrial fibrillation. Left  ventricular diastolic function could not be evaluated.  LV Wall Scoring: The posterior wall and basal inferior segment are severely hypokinetic, on a background of mild global hypokinesis. Right Ventricle: The right ventricular size is normal. No increase in right ventricular wall thickness. Right ventricular systolic function is mildly reduced. The tricuspid regurgitant velocity is 2.60 m/s, and with an assumed right atrial pressure of 8 mmHg, the estimated right ventricular systolic pressure is 35.0 mmHg. Left Atrium: Left atrial size was moderately dilated. No left atrial/left atrial appendage thrombus was detected. The LAA emptying velocity was 23 cm/s. Right Atrium: Right atrial size was mildly dilated. Pericardium: There is no evidence of pericardial effusion. Mitral Valve: The mitral valve is normal in structure. Trivial mitral valve regurgitation. No evidence of mitral valve stenosis. Tricuspid Valve: The tricuspid valve is normal in structure. Tricuspid valve regurgitation is trivial. Aortic Valve: Possibly a functionally bicuspid aortic valve. There is a calcified raphe between the left and noncoronary cusps. The aortic valve is abnormal. There is mild calcification of the aortic valve. There is mild thickening of the aortic valve. Aortic valve regurgitation is not visualized. Aortic valve sclerosis/calcification is present, without any evidence of aortic stenosis. Pulmonic Valve: The pulmonic valve was grossly normal. Pulmonic valve regurgitation is not visualized. No evidence of pulmonic stenosis. Aorta: The aortic root, ascending aorta, aortic arch and descending aorta are all structurally normal, with no evidence of dilitation or obstruction. There is moderate (Grade III) plaque involving the descending aorta. IAS/Shunts: No atrial level shunt detected by color flow Doppler. Additional Comments: A device lead is visualized in the right ventricle. TRICUSPID VALVE TR Peak grad:   27.0 mmHg TR Vmax:         260.00 cm/s Jerel Balding MD Electronically signed by Jerel Balding MD Signature Date/Time: 03/04/2024/3:37:26 PM    Final    EP STUDY Result Date: 03/04/2024 See surgical note for result.  DG Chest 2 View Result Date: 03/02/2024 CLINICAL DATA:  [id number].  Cardiac device in situ. EXAM: CHEST - 2 VIEW COMPARISON:  Portable chest 02/27/2024 FINDINGS: AP and lateral 5:15 a.m. There is a new left chest dual lead pacing system/AID. One of the wires terminates in the plane of the right atrium and the other in the right ventricle. There is no measurable pneumothorax. The  lungs are clear. No substantial pleural effusion. Vascular congestion and interstitial edema noted previously are no longer seen. Stable mediastinum. There is aortic atherosclerosis. Mild cardiomegaly. Three level lower cervical ACDF plating is partially visible. There is thoracic spondylosis. IMPRESSION: 1. New left chest dual lead pacing system/AID. No pneumothorax. 2. No acute radiographic chest findings. 3. Aortic atherosclerosis. Electronically Signed   By: Francis Quam M.D.   On: 03/02/2024 05:48   EP PPM/ICD IMPLANT Result Date: 03/01/2024  CONCLUSIONS:  1. Successful dual chamber ICD implantation for secondary prevention of sudden death.  2.  No early apparent complications. Fonda Kitty, MD, [facility], [facility] Cardiac Electrophysiology   MR CARDIAC MORPHOLOGY W WO CONTRAST Result Date: 02/29/2024 CLINICAL DATA:  Clinical question of scar formation Study assumes HCT of 32 and BSA of 2.49 m2. EXAM: CARDIAC MRI TECHNIQUE: The patient was scanned on a 1.5 Tesla GE magnet. A dedicated cardiac coil was used. Functional imaging was done using Fiesta sequences. 2,3, and 4 chamber views were done to assess for RWMA's. Modified Simpson's rule using was used to calculate an ejection fraction on a dedicated work [employer]. The patient received 10 cc of Gadavist . After 10 minutes inversion recovery sequences were used to assess for  infiltration and scar tissue. Flow quantification was performed 2 times during this examination with flow quantification performed at the levels of the ascending aorta above the valve, pulmonary artery above the valve. CONTRAST:  10 cc  of Gadavist  FINDINGS: 1. Normal left ventricular size, with LVEDD 55 mm, and LVEDVi 59 mL/m2. Normal left ventricular thickness. Moderate decrease ventricular systolic function (LVEF =35%). Patient is free breathing and in a irregular rapid rhythm. Grossly suggestive of global hypokinesis. Unable to perform accurate strain. Left ventricular parametric mapping notable for elevated native T1 in the basal lateral (1372 ms), poor post contrast T1 comparison for ECV, and increased T2 in the basal lateral (59 ms). There is late gadolinium enhancement in the left ventricular myocardium, that is subendocardial, in the inferior lateral wall. This accounts for 75% of the wall. Cannot exclude abnormal perfusion in the mid inferolateral. 2. Normal right ventricular size with RVEDVI 53 mL/m2. Normal right ventricular thickness. Moderate decrease in the right ventricular systolic function (RVEF =31%). There are no regional wall motion abnormalities or aneurysms but global hypokinesis. 3.  Normal right atrial size with left atrial enlargement. 4. Normal size of the aortic root, ascending aorta. Moderate dilation of the main pulmonary artery, 31 mm. 5. Valve assessment: Aortic Valve: Tri-leaflet and calcified valve. Mean gradient 4 mm Hg using anti-aliasing. Regurgitant fraction of 21% may overestimate regurgitation in the setting of RVR acquisition. Pulmonic Valve: Mild regurgitation, regurgitant fraction of 8% may overestimate regurgitation in the setting of RVR acquisition. Tricuspid Valve: Sub-optimal view of valve morphology. Qualitatively mild, central regurgitation. Mitral Valve: Mild central regurgitation. Regurgitant fraction of 13%. 6. Normal pericardium. Small basal lateral pericardial  effusion without evidence of tamponade physiology. 7. Grossly, bilateral pleural effusion noted but no extracardiac findings. Recommended dedicated study if concerned for non-cardiac pathology. 8. Difficult acquisition in the setting of free breathing and AF RVR. IMPRESSION: 1. LVEF 35% in the setting of AF RVR. 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 In correlation with lab testing showing no acute infarction this admission, this is less likely a viable territory. 2.  RVEF 31% in the setting of AF RVR. 3.  Bilateral pleural effusions incidentally noted. Stanly Leavens MD Electronically Signed  By: Stanly Leavens M.D.   On: 02/29/2024 15:57   MR CARDIAC VELOCITY FLOW MAP Result Date: 02/29/2024 CLINICAL DATA:  Clinical question of scar formation Study assumes HCT of 32 and BSA of 2.49 m2. EXAM: CARDIAC MRI TECHNIQUE: The patient was scanned on a 1.5 Tesla GE magnet. A dedicated cardiac coil was used. Functional imaging was done using Fiesta sequences. 2,3, and 4 chamber views were done to assess for RWMA's. Modified Simpson's rule using was used to calculate an ejection fraction on a dedicated work Research officer, [employer]. The patient received 10 cc of Gadavist . After 10 minutes inversion recovery sequences were used to assess for infiltration and scar tissue. Flow quantification was performed 2 times during this examination with flow quantification performed at the levels of the ascending aorta above the valve, pulmonary artery above the valve. CONTRAST:  10 cc  of Gadavist  FINDINGS: 1. Normal left ventricular size, with LVEDD 55 mm, and LVEDVi 59 mL/m2. Normal left ventricular thickness. Moderate decrease ventricular systolic function (LVEF =35%). Patient is free breathing and in a irregular rapid rhythm. Grossly suggestive of global hypokinesis. Unable to perform accurate strain. Left ventricular parametric mapping notable for elevated native T1 in the  basal lateral (1372 ms), poor post contrast T1 comparison for ECV, and increased T2 in the basal lateral (59 ms). There is late gadolinium enhancement in the left ventricular myocardium, that is subendocardial, in the inferior lateral wall. This accounts for 75% of the wall. Cannot exclude abnormal perfusion in the mid inferolateral. 2. Normal right ventricular size with RVEDVI 53 mL/m2. Normal right ventricular thickness. Moderate decrease in the right ventricular systolic function (RVEF =31%). There are no regional wall motion abnormalities or aneurysms but global hypokinesis. 3.  Normal right atrial size with left atrial enlargement. 4. Normal size of the aortic root, ascending aorta. Moderate dilation of the main pulmonary artery, 31 mm. 5. Valve assessment: Aortic Valve: Tri-leaflet and calcified valve. Mean gradient 4 mm Hg using anti-aliasing. Regurgitant fraction of 21% may overestimate regurgitation in the setting of RVR acquisition. Pulmonic Valve: Mild regurgitation, regurgitant fraction of 8% may overestimate regurgitation in the setting of RVR acquisition. Tricuspid Valve: Sub-optimal view of valve morphology. Qualitatively mild, central regurgitation. Mitral Valve: Mild central regurgitation. Regurgitant fraction of 13%. 6. Normal pericardium. Small basal lateral pericardial effusion without evidence of tamponade physiology. 7. Grossly, bilateral pleural effusion noted but no extracardiac findings. Recommended dedicated study if concerned for non-cardiac pathology. 8. Difficult acquisition in the setting of free breathing and AF RVR. IMPRESSION: 1. LVEF 35% in the setting of AF RVR. 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 In correlation with lab testing showing no acute infarction this admission, this is less likely a viable territory. 2.  RVEF 31% in the setting of AF RVR. 3.  Bilateral pleural effusions incidentally noted. Stanly Leavens MD Electronically  Signed   By: Stanly Leavens M.D.   On: 02/29/2024 15:57   MR CARDIAC VELOCITY FLOW MAP Result Date: 02/29/2024 CLINICAL DATA:  Clinical question of scar formation Study assumes HCT of 32 and BSA of 2.49 m2. EXAM: CARDIAC MRI TECHNIQUE: The patient was scanned on a 1.5 Tesla GE magnet. A dedicated cardiac coil was used. Functional imaging was done using Fiesta sequences. 2,3, and 4 chamber views were done to assess for RWMA's. Modified Simpson's rule using was used to calculate an ejection fraction on a dedicated work Research officer, trade [employer]. The patient received 10  cc of Gadavist . After 10 minutes inversion recovery sequences were used to assess for infiltration and scar tissue. Flow quantification was performed 2 times during this examination with flow quantification performed at the levels of the ascending aorta above the valve, pulmonary artery above the valve. CONTRAST:  10 cc  of Gadavist  FINDINGS: 1. Normal left ventricular size, with LVEDD 55 mm, and LVEDVi 59 mL/m2. Normal left ventricular thickness. Moderate decrease ventricular systolic function (LVEF =35%). Patient is free breathing and in a irregular rapid rhythm. Grossly suggestive of global hypokinesis. Unable to perform accurate strain. Left ventricular parametric mapping notable for elevated native T1 in the basal lateral (1372 ms), poor post contrast T1 comparison for ECV, and increased T2 in the basal lateral (59 ms). There is late gadolinium enhancement in the left ventricular myocardium, that is subendocardial, in the inferior lateral wall. This accounts for 75% of the wall. Cannot exclude abnormal perfusion in the mid inferolateral. 2. Normal right ventricular size with RVEDVI 53 mL/m2. Normal right ventricular thickness. Moderate decrease in the right ventricular systolic function (RVEF =31%). There are no regional wall motion abnormalities or aneurysms but global hypokinesis. 3.  Normal right atrial size with left atrial  enlargement. 4. Normal size of the aortic root, ascending aorta. Moderate dilation of the main pulmonary artery, 31 mm. 5. Valve assessment: Aortic Valve: Tri-leaflet and calcified valve. Mean gradient 4 mm Hg using anti-aliasing. Regurgitant fraction of 21% may overestimate regurgitation in the setting of RVR acquisition. Pulmonic Valve: Mild regurgitation, regurgitant fraction of 8% may overestimate regurgitation in the setting of RVR acquisition. Tricuspid Valve: Sub-optimal view of valve morphology. Qualitatively mild, central regurgitation. Mitral Valve: Mild central regurgitation. Regurgitant fraction of 13%. 6. Normal pericardium. Small basal lateral pericardial effusion without evidence of tamponade physiology. 7. Grossly, bilateral pleural effusion noted but no extracardiac findings. Recommended dedicated study if concerned for non-cardiac pathology. 8. Difficult acquisition in the setting of free breathing and AF RVR. IMPRESSION: 1. LVEF 35% in the setting of AF RVR. 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 In correlation with lab testing showing no acute infarction this admission, this is less likely a viable territory. 2.  RVEF 31% in the setting of AF RVR. 3.  Bilateral pleural effusions incidentally noted. Stanly Leavens MD Electronically Signed   By: Stanly Leavens M.D.   On: 02/29/2024 15:57   DG CHEST PORT 1 VIEW Result Date: 02/27/2024 EXAM: 1 VIEW XRAY OF THE CHEST 02/27/2024 08:02:00 AM COMPARISON: 01/21/2024 CLINICAL HISTORY: Dyspnea FINDINGS: LUNGS AND PLEURA: Pulmonary vascular congestion without frank edema. No focal pulmonary opacity. No pleural effusion. No pneumothorax. HEART AND MEDIASTINUM: No acute abnormality of the cardiac and mediastinal silhouettes. Aortic atherosclerosis. BONES AND SOFT TISSUES: Cervical spine fusion hardware. No acute osseous abnormality. IMPRESSION: 1. Pulmonary vascular congestion without frank edema.  Electronically signed by: Waddell Calk MD 02/27/2024 08:22 AM EDT RP Workstation: [id number]   EP STUDY Result Date: 02/26/2024 See surgical note for result.  CT Angio Chest/Abd/Pel for Dissection W and/or Wo Contrast Addendum Date: 02/26/2024 ADDENDUM REPORT: 02/26/2024 15:08 ADDENDUM: There is a single new 3 mm right upper lobe nodule image 16/66. No additional follow-up recommended. Electronically Signed   By: Greig Pique M.D.   On: 02/26/2024 15:08   Result Date: 02/26/2024 CLINICAL DATA:  Evaluate for dissection. EXAM: CT ANGIOGRAPHY CHEST, ABDOMEN AND PELVIS TECHNIQUE: Noncontrast chest CT was performed. Multidetector CT imaging through the chest, abdomen and pelvis was performed using the  standard protocol during bolus administration of intravenous contrast. Multiplanar reconstructed images and MIPs were obtained and reviewed to evaluate the vascular anatomy. RADIATION DOSE REDUCTION: This exam was performed according to the departmental dose-optimization program which includes automated exposure control, adjustment of the mA and/or kV according to patient size and/or use of iterative reconstruction technique. CONTRAST:  OMNIPAQUE  IOHEXOL  350 MG/ML SOLN COMPARISON:  CT PE protocol 08/24/2023. CT chest abdomen and pelvis 01/30/2023. CT chest 01/26/2020. FINDINGS: CTA CHEST FINDINGS Cardiovascular: Aorta is normal in size. There is no evidence for aortic dissection or aneurysm. Origin of the great vessels appears patent. Aberrant right subclavian artery present. There are mild calcified atherosclerotic plaque throughout the aorta. The heart is mildly enlarged. There is no pericardial effusion. There are atherosclerotic calcifications of the coronary arteries. Mediastinum/Nodes: No enlarged mediastinal, hilar, or axillary lymph nodes. Thyroid gland, trachea, and esophagus demonstrate no significant findings. Lungs/Pleura: Right lower lobe nodular density peripherally measures 7 mm image 6/87,  unchanged. There is also stable right lower lobe nodule image 6/81 measuring 5 mm. These are unchanged from 2021 and favored as benign. The lungs are otherwise clear. There is no pleural effusion or pneumothorax. Musculoskeletal: Cervical spinal fusion plate is present. There is T10 compression fracture which appears acute or subacute and has progressed compared to the prior study. Review of the MIP images confirms the above findings. CTA ABDOMEN AND PELVIS FINDINGS VASCULAR Aorta: Normal caliber aorta without aneurysm, dissection, vasculitis or significant stenosis. Mild calcified atherosclerotic disease present. Celiac: Patent without evidence of aneurysm, dissection, vasculitis or significant stenosis. SMA: Patent without evidence of aneurysm, dissection, vasculitis or significant stenosis. Renals: Both renal arteries are patent without evidence of aneurysm, dissection, vasculitis, fibromuscular dysplasia or significant stenosis. IMA: Patent without evidence of aneurysm, dissection, vasculitis or significant stenosis. Inflow: Patent without evidence of aneurysm, dissection, vasculitis or significant stenosis. Veins: No obvious venous abnormality within the limitations of this arterial phase study. Review of the MIP images confirms the above findings. NON-VASCULAR Hepatobiliary: No focal liver abnormality is seen. No gallstones, gallbladder wall thickening, or biliary dilatation. Pancreas: Unremarkable. No pancreatic ductal dilatation or surrounding inflammatory changes. Spleen: Normal in size without focal abnormality. Adrenals/Urinary Tract: Bilateral renal cysts are present. The largest is in the superior pole of the left kidney measuring 3.3 cm. There is no hydronephrosis or perinephric fluid. Adrenal glands and bladder are within normal limits. Stomach/Bowel: Stomach is within normal limits. Appendix appears normal. No evidence of bowel wall thickening, distention, or inflammatory changes. Lymphatic: No  enlarged lymph nodes are seen. Reproductive: TURP defect again noted. Other: There is a small fat containing umbilical hernia. There is no ascites. Musculoskeletal: Prominent Schmorl's node of L3 is unchanged. Review of the MIP images confirms the above findings. IMPRESSION: 1. No evidence for aortic dissection or aneurysm. 2. No acute cardiopulmonary process. 3. T10 compression fracture has progressed compared to the prior study. 4. No acute localizing process in the abdomen or pelvis. Aortic Atherosclerosis (ICD10-I70.0). Electronically Signed: By: Greig Pique M.D. On: 02/25/2024 16:42   ECHOCARDIOGRAM COMPLETE Result Date: 02/26/2024    ECHOCARDIOGRAM REPORT   Patient Name:   Eileene Kisling C Donahey Date of Exam: 02/26/2024 Medical Rec #:  [id number]     Height:       70.0 in Accession #:    [id number]    Weight:       275.1 lb Date of Birth:  1950-08-04      BSA:  2.390 m Patient Age:    73 years      BP:           111/75 mmHg Patient Gender: M             HR:           87 bpm. Exam Location:  Inpatient Procedure: 2D Echo and Intracardiac Opacification Agent (Both Spectral and Color            Flow Doppler were utilized during procedure). Indications:     Dyspnea  History:         Patient has prior history of Echocardiogram examinations.                  Aortic Valve Disease, Arrythmias:Atrial Fibrillation;                  Signs/Symptoms:Dyspnea.  Sonographer:     Charmaine Gaskins Referring Phys:  [id number] A THOMAS Diagnosing Phys: Lonni Nanas MD IMPRESSIONS  1. Left ventricular ejection fraction, by estimation, is 45 to 50%. The left ventricle has mildly decreased function. The left ventricle demonstrates global hypokinesis. The left ventricular internal cavity size was mildly dilated. Left ventricular diastolic parameters are indeterminate.  2. Right ventricular systolic function is normal. The right ventricular size is normal. There is mildly elevated pulmonary artery systolic pressure. The  estimated right ventricular systolic pressure is 37.8 mmHg.  3. Left atrial size was mildly dilated.  4. The mitral valve is normal in structure. Mild mitral valve regurgitation.  5. The aortic valve is calcified. Aortic valve regurgitation is not visualized. Mild to moderate aortic valve stenosis. Vmax 2.8 m/s, MG 21 mmHg, AVA 1.6 cm^2, DI 0.45  6. The inferior vena cava is normal in size with greater than 50% respiratory variability, suggesting right atrial pressure of 3 mmHg. FINDINGS  Left Ventricle: Left ventricular ejection fraction, by estimation, is 45 to 50%. The left ventricle has mildly decreased function. The left ventricle demonstrates global hypokinesis. Definity  contrast agent was given IV to delineate the left ventricular  endocardial borders. The left ventricular internal cavity size was mildly dilated. There is no left ventricular hypertrophy. Left ventricular diastolic parameters are indeterminate. Right Ventricle: The right ventricular size is normal. No increase in right ventricular wall thickness. Right ventricular systolic function is normal. There is mildly elevated pulmonary artery systolic pressure. The tricuspid regurgitant velocity is 2.95  m/s, and with an assumed right atrial pressure of 3 mmHg, the estimated right ventricular systolic pressure is 37.8 mmHg. Left Atrium: Left atrial size was mildly dilated. Right Atrium: Right atrial size was normal in size. Pericardium: Trivial pericardial effusion is present. Mitral Valve: The mitral valve is normal in structure. Mild mitral valve regurgitation. Tricuspid Valve: The tricuspid valve is normal in structure. Tricuspid valve regurgitation is mild. Aortic Valve: The aortic valve is calcified. Aortic valve regurgitation is not visualized. Mild to moderate aortic stenosis is present. Aortic valve mean gradient measures 18.6 mmHg. Aortic valve peak gradient measures 27.5 mmHg. Aortic valve area, by VTI measures 1.39 cm. Pulmonic Valve: The  pulmonic valve was not well visualized. Pulmonic valve regurgitation is not visualized. Aorta: The aortic root and ascending aorta are structurally normal, with no evidence of dilitation. Venous: The inferior vena cava is normal in size with greater than 50% respiratory variability, suggesting right atrial pressure of 3 mmHg. IAS/Shunts: The atrial septum is grossly normal.  LEFT VENTRICLE PLAX 2D LVIDd:  6.00 cm      Diastology LVIDs:         4.60 cm      LV e' medial:    7.87 cm/s LV PW:         0.90 cm      LV E/e' medial:  14.2 LV IVS:        1.00 cm      LV e' lateral:   8.01 cm/s LVOT diam:     2.20 cm      LV E/e' lateral: 13.9 LV SV:         77 LV SV Index:   32 LVOT Area:     3.80 cm  LV Volumes (MOD) LV vol d, MOD A2C: 122.0 ml LV vol d, MOD A4C: 121.0 ml LV vol s, MOD A2C: 43.7 ml LV vol s, MOD A4C: 76.9 ml LV SV MOD A2C:     78.3 ml LV SV MOD A4C:     121.0 ml LV SV MOD BP:      64.1 ml RIGHT VENTRICLE RV Basal diam:  3.50 cm RV Mid diam:    3.60 cm RV S prime:     11.20 cm/s LEFT ATRIUM             Index        RIGHT ATRIUM           Index LA diam:        4.80 cm 2.01 cm/m   RA Area:     19.10 cm LA Vol (A2C):   89.8 ml 37.57 ml/m  RA Volume:   47.80 ml  20.00 ml/m LA Vol (A4C):   87.9 ml 36.77 ml/m LA Biplane Vol: 91.9 ml 38.45 ml/m  AORTIC VALVE AV Area (Vmax):    1.52 cm AV Area (Vmean):   1.39 cm AV Area (VTI):     1.39 cm AV Vmax:           262.40 cm/s AV Vmean:          209.000 cm/s AV VTI:            0.554 m AV Peak Grad:      27.5 mmHg AV Mean Grad:      18.6 mmHg LVOT Vmax:         104.92 cm/s LVOT Vmean:        76.200 cm/s LVOT VTI:          0.202 m LVOT/AV VTI ratio: 0.36  AORTA Ao Asc diam: 3.40 cm MITRAL VALVE                TRICUSPID VALVE MV Area (PHT): 4.70 cm     TR Peak grad:   34.8 mmHg MV Decel Time: 161 msec     TR Vmax:        295.00 cm/s MV E velocity: 111.67 cm/s                             SHUNTS                             Systemic VTI:  0.20 m                              Systemic Diam: 2.20 cm Lonni Nanas MD Electronically signed by Lonni Nanas MD  Signature Date/Time: 02/26/2024/2:34:07 PM    Final (Updated)      Time coordinating discharge: Over 30 minutes    Alm Apo, MD  [facility] 03/09/2024, 12:28 PM

## 2024-03-09 NOTE — Progress Notes (Signed)
 Mobility Specialist Progress Note:   03/09/24 0915  Mobility  Activity Ambulated with assistance  Level of Assistance Contact guard assist, steadying assist  Assistive Device Four wheel walker  Distance Ambulated (ft) 100 ft ((+30+ 100))  Activity Response Tolerated well  Mobility Referral Yes  Mobility visit 1 Mobility  Mobility Specialist Start Time (ACUTE ONLY) 0915  Mobility Specialist Stop Time (ACUTE ONLY) 0930  Mobility Specialist Time Calculation (min) (ACUTE ONLY) 15 min   Pt agreeable to mobility session. Required only CGA for safety with ambulation with rollator. X2 seated rest breaks needed d/t fatigue. Pt left sitting in chair with all needs met. VSS on RA.   Therisa Rana Mobility Specialist Please contact via SecureChat or  Rehab office at [phone]

## 2024-03-09 NOTE — TOC Transition Note (Addendum)
 Transition of Care [facility]) - Discharge Note   Patient Details  Name: Mike Hunter MRN: [id number] Date of Birth: October 10, 1950  Transition [facility]) CM/SW Contact:  Waddell Barnie Rama, RN Phone Number: 03/09/2024, 10:41 AM   Clinical Narrative:    For dc today, wife will transport him home.  He is set up with [facility] notified of dc today.  Wife asked this NCM if have any brochures for private duty, This NCM said yes I  will bring some to the room.     Barriers to Discharge: Insurance Authorization   Patient Goals and CMS Choice Patient states their goals for this hospitalization and ongoing recovery are:: return home   Choice offered to / list presented to : NA      Discharge Placement                       Discharge Plan and Services Additional resources added to the After Visit Summary for   In-house Referral: NA Discharge Planning Services: CM Consult Post Acute Care Choice: NA          DME Arranged: N/A DME Agency: NA       HH Arranged: NA HH Agency: NA        Social Drivers of Health (SDOH) Interventions SDOH Screenings   Food Insecurity: No Food Insecurity (02/26/2024)  Housing: Low Risk  (02/26/2024)  Transportation Needs: No Transportation Needs (02/26/2024)  Utilities: Not At Risk (02/26/2024)  Social Connections: Socially Integrated (02/26/2024)  Tobacco Use: Medium Risk (02/26/2024)     Readmission Risk Interventions    02/29/2024    3:28 PM 12/31/2023   10:58 AM  Readmission Risk Prevention Plan  Transportation Screening Complete Complete  Medication Review Oceanographer) Complete Referral to Pharmacy  HRI or Home Care Consult Complete Complete  SW Recovery Care/Counseling Consult  Complete  Palliative Care Screening Not Applicable Not Applicable  Skilled Nursing Facility Not Applicable Not Applicable

## 2024-03-09 NOTE — TOC Progression Note (Addendum)
 Transition of Care [facility]) - Progression Note    Patient Details  Name: Mike Hunter MRN: [id number] Date of Birth: [birth year]  [facility]) CM/SW Contact  Waddell Barnie Rama, RN Phone Number: 03/09/2024, 10:32 AM  Clinical Narrative:    NCM was notified by CSW that insurance denied SNF stating he has no skilled needs.  Wife has decided to take him home with [facility]  and she did not want to do an appeal.  She  told CSW they have had [facility] in the past and would like to have them again.  NCM confirmed with wife.  NCM made referral to [facility] with [facility] for HHRN, HHPT, HHOT.  She is able to take referral.  Soc will begin 24 to 48 hrs post dc.    Expected Discharge Plan: Skilled Nursing Facility Barriers to Discharge: Insurance Authorization               Expected Discharge Plan and Services In-house Referral: NA Discharge Planning Services: CM Consult Post Acute Care Choice: NA Living arrangements for the past 2 months: Single Family Home                 DME Arranged: N/A DME Agency: NA       HH Arranged: NA HH Agency: NA         Social Drivers of Health (SDOH) Interventions SDOH Screenings   Food Insecurity: No Food Insecurity (02/26/2024)  Housing: Low Risk  (02/26/2024)  Transportation Needs: No Transportation Needs (02/26/2024)  Utilities: Not At Risk (02/26/2024)  Social Connections: Socially Integrated (02/26/2024)  Tobacco Use: Medium Risk (02/26/2024)    Readmission Risk Interventions    02/29/2024    3:28 PM 12/31/2023   10:58 AM  Readmission Risk Prevention Plan  Transportation Screening Complete Complete  Medication Review Oceanographer) Complete Referral to Pharmacy  HRI or Home Care Consult Complete Complete  SW Recovery Care/Counseling Consult  Complete  Palliative Care Screening Not Applicable Not Applicable  Skilled Nursing Facility Not Applicable Not Applicable

## 2024-03-09 NOTE — Progress Notes (Signed)
 Physical Therapy Treatment Patient Details Name: Mike Hunter MRN: [id number] DOB: January 21, 1951 Today's Date: 03/09/2024   History of Present Illness 73 y/o M adm 02/25/24 with Afib with RVR. 9/12 DCCV. 9/16 ICD placed. 9/19 DCCV. PMHx:  Afib, bipolar d/o, CHF, COPD, BPH, T2DM, HTN, reflux, CAD    PT Comments  The pt is making gradual functional progress as he was able to navigate x3 stairs with bil UE support and CGA for safety today. However, he needs repeated cues and education to maintain his pacemaker precautions. Provided pt with handout on ICD precautions and reviewed techniques for bed mobility and transfers to ensure compliance. The pt is currently benefiting from using a walker rather than his walking stick for safer standing mobility. Pt verbalized understanding of recs and education provided.      If plan is discharge home, recommend the following: A little help with bathing/dressing/bathroom;Assistance with cooking/housework;Assist for transportation;Help with stairs or ramp for entrance;A little help with walking and/or transfers   Can travel by private vehicle     Yes  Equipment Recommendations  Rollator (4 wheels)    Recommendations for Other Services       Precautions / Restrictions Precautions Precautions: ICD/Pacemaker;Fall Recall of Precautions/Restrictions: Impaired Precaution/Restrictions Comments: education for precautions throughout session, provided pt with ICD precautions handout Restrictions Other Position/Activity Restrictions: L UE pacemaker precautions     Mobility  Bed Mobility Overal bed mobility: Needs Assistance Bed Mobility: Sidelying to Sit, Sit to Supine   Sidelying to sit: Supervision   Sit to supine: Supervision   General bed mobility comments: Pt transitioned sidelying to sit L EOB 2x, needing cues to reduce L UE weight bearing to maintain ICD precautions and to improve R UE placement to push trunk up to sit as pt reports pt is unable to  switch to exit R side of bed at home. Bed flat for bed mobility without use of rails to simulate home. Pt able to return to supine with supervision    Transfers Overall transfer level: Needs assistance Equipment used: Rolling walker (2 wheels) (walking stick) Transfers: Sit to/from Stand Sit to Stand: Contact guard assist           General transfer comment: Pt needed reminders for L UE placement to maintain ICD precautions, x1 transfer from EOB to walking stick in R hand and x1 transfer from EOB to RW with L UE on RW and R on bed to push up to stand, CGA for safety, extra effort noted to initiate power up    Ambulation/Gait Ambulation/Gait assistance: Contact guard assist Gait Distance (Feet): 20 Feet (x2 bouts of ~10 ft > ~20 ft) Assistive device: Rolling walker (2 wheels) (walking stick) Gait Pattern/deviations: Step-through pattern, Decreased stride length Gait velocity: reduced Gait velocity interpretation: <1.31 ft/sec, indicative of household ambulator   General Gait Details: Pt ambulated the first bout with walking stick in R hand with mild trunk sway. Improved stability noted using RW on second bout. Educated pt to use his RW initially upon d/c at this time. He verbalized understanding. CGA for safety   Stairs Stairs: Yes Stairs assistance: Contact guard assist Stair Management: Two rails, Step to pattern, Forwards Number of Stairs: 3 General stair comments: Pt ascended and descended portable step 3x while using RW over top of it as rails, no LOB, CGA for safety   Wheelchair Mobility     Tilt Bed    Modified Rankin (Stroke Patients Only)       Balance Overall  balance assessment: Needs assistance Sitting-balance support: No upper extremity supported, Feet supported Sitting balance-Leahy Scale: Fair     Standing balance support: Single extremity supported, Bilateral upper extremity supported, During functional activity, Reliant on assistive device for  balance Standing balance-Leahy Scale: Poor Standing balance comment: reliant on UE support                            Communication Communication Communication: No apparent difficulties  Cognition Arousal: Alert Behavior During Therapy: WFL for tasks assessed/performed   PT - Cognitive impairments: Safety/Judgement, Memory                       PT - Cognition Comments: decreased awareness and implimentation of ICD restrictions, needing reminders of precautions Following commands: Intact      Cueing Cueing Techniques: Verbal cues  Exercises      General Comments General comments (skin integrity, edema, etc.): Educated pt on ICD precautions handout provided      Pertinent Vitals/Pain Pain Assessment Pain Assessment: Faces Faces Pain Scale: Hurts little more Pain Location: L shoulder Pain Descriptors / Indicators: Sore, Operative site guarding Pain Intervention(s): Monitored during session, Limited activity within patient's tolerance, Repositioned    Home Living                          Prior Function            PT Goals (current goals can now be found in the care plan section) Acute Rehab PT Goals Patient Stated Goal: to be independent and safe at d/c PT Goal Formulation: With patient Potential to Achieve Goals: Good Progress towards PT goals: Progressing toward goals    Frequency    Min 2X/week      PT Plan      Co-evaluation              AM-PAC PT 6 Clicks Mobility   Outcome Measure  Help needed turning from your back to your side while in a flat bed without using bedrails?: A Little Help needed moving from lying on your back to sitting on the side of a flat bed without using bedrails?: A Little Help needed moving to and from a bed to a chair (including a wheelchair)?: A Little Help needed standing up from a chair using your arms (e.g., wheelchair or bedside chair)?: A Little Help needed to walk in hospital  room?: A Little Help needed climbing 3-5 steps with a railing? : A Little 6 Click Score: 18    End of Session   Activity Tolerance: Patient tolerated treatment well Patient left: in bed;with call bell/phone within reach;with bed alarm set   PT Visit Diagnosis: Other abnormalities of gait and mobility (R26.89);Difficulty in walking, not elsewhere classified (R26.2);Unsteadiness on feet (R26.81) Pain - Right/Left: Left Pain - part of body: Shoulder     Time: 8897-8874 PT Time Calculation (min) (ACUTE ONLY): 23 min  Charges:    $Gait Training: 8-22 mins $Therapeutic Activity: 8-22 mins PT General Charges $$ ACUTE PT VISIT: 1 Visit                     Theo Ferretti, PT, DPT Acute Rehabilitation Services  Office: [phone]    Theo CHRISTELLA Ferretti 03/09/2024, 2:10 PM

## 2024-03-10 ENCOUNTER — Telehealth: Payer: Self-pay | Admitting: Physician Assistant

## 2024-03-10 NOTE — Telephone Encounter (Signed)
 Error

## 2024-03-14 ENCOUNTER — Ambulatory Visit: Admitting: Physician Assistant

## 2024-03-15 ENCOUNTER — Ambulatory Visit: Attending: Cardiology

## 2024-03-15 ENCOUNTER — Encounter: Payer: Self-pay | Admitting: Student in an Organized Health Care Education/Training Program

## 2024-03-15 ENCOUNTER — Ambulatory Visit (INDEPENDENT_AMBULATORY_CARE_PROVIDER_SITE_OTHER): Admitting: Student in an Organized Health Care Education/Training Program

## 2024-03-15 VITALS — BP 97/67 | HR 119 | Ht 70.0 in | Wt 276.0 lb

## 2024-03-15 DIAGNOSIS — I5022 Chronic systolic (congestive) heart failure: Secondary | ICD-10-CM

## 2024-03-15 DIAGNOSIS — I4819 Other persistent atrial fibrillation: Secondary | ICD-10-CM

## 2024-03-15 LAB — CUP PACEART INCLINIC DEVICE CHECK
Date Time Interrogation Session: 20250930164238
HighPow Impedance: 62 Ohm
Implantable Lead Connection Status: 753985
Implantable Lead Connection Status: 753985
Implantable Lead Implant Date: 20250916
Implantable Lead Implant Date: 20250916
Implantable Lead Location: 753859
Implantable Lead Location: 753860
Implantable Lead Model: 673
Implantable Lead Model: 7841
Implantable Lead Serial Number: 1653982
Implantable Lead Serial Number: 272899
Implantable Pulse Generator Implant Date: 20250916
Lead Channel Impedance Value: 457 Ohm
Lead Channel Impedance Value: 635 Ohm
Lead Channel Pacing Threshold Amplitude: 0 V
Lead Channel Pacing Threshold Amplitude: 0.4 V
Lead Channel Pacing Threshold Pulse Width: 0 ms
Lead Channel Pacing Threshold Pulse Width: 0.4 ms
Lead Channel Sensing Intrinsic Amplitude: 15.3 mV
Lead Channel Sensing Intrinsic Amplitude: 4.2 mV
Lead Channel Setting Pacing Amplitude: 3.5 V
Lead Channel Setting Pacing Amplitude: 3.5 V
Lead Channel Setting Pacing Pulse Width: 0.4 ms
Lead Channel Setting Sensing Sensitivity: 0.5 mV
Pulse Gen Serial Number: 703705
Zone Setting Status: 755011

## 2024-03-15 MED ORDER — METOPROLOL SUCCINATE ER 50 MG PO TB24: 50.0000 mg | ORAL_TABLET | Freq: Two times a day (BID) | ORAL | Status: DC

## 2024-03-15 NOTE — Progress Notes (Signed)
 Cardiology Office Note   Date:  03/15/2024  ID:  Mike Hunter, DOB 16-Feb-1951, MRN [id number] PCP: [name], [name], MD  [facility] Providers Cardiologist:  None Electrophysiologist:  [name], MD    History of Present Illness Mike Hunter is a 73 y.o. male with CAD s/p LAD PCI (10/2023), HFrEF/ICM, sustained VT s/p 2' prevention LEFT BSCI DC ICD (DOI 03/01/24), PVD s/p L ICA (05/2023), HTN, HLD, COPD, OSA on CPAP, neuropathy, DM2, BPD, CKD3, and HPpEF who presents for arrhythmia management.   He was seen in May 2025 and was complaining of chest pain with plans for coronary evaluation following an abnormal dobutamine stress test.  Subsequent coronary eval on 11/06/2023 with borderline mLAD stenosis (RFR 0.73) s/p PCI (2 overlapping stents) to LAD.    Recent hospitalization for chest pain, shortness of breath, and AF/RVR in the setting of pneumonia for which she spontaneously converted back to sinus rhythm.  He was admitted on 02/26/2024 after several weeks of symptomatic AF and AF/RVR.  He was found to be in AF RVR and was started on Dilt gtt. for better rate control followed by subsequent cardioversion on 09/12 to normal sinus rhythm.  On 09/13 he had a wide-complex tachycardia associated with hypotension and severe chest pain for which he was given amiodarone  bolus and spontaneously broke during sedation for urgent cardioversion.  I reviewed 2 rhythm strips with wide-complex tachycardia with A-V dissociation (+aVR, - aVL) c/w sustained VT. Both were monomorphic and similar origin.  He does as well have intermittent PVCs that are different morphology to his VT. cMRI with LVEF 35% in setting of AF/RVR with inferolateral LGE c/w prior infarct. RVEF also reduced at 31%.  His sinus rhythm ECG without any conduction abnormalities.     He underwent successful LEFT BSCI DC ICD implant on 03/01/24 (Parker/[facility]otty Weigelt).   He then had repeat attempted cardioversion on 03/04/2024 however  he did not cardiovert despite several attempts.  He was continued on Amio IV and converted to p.o. load and discharged with plans for outpatient cardioversion.  Today he presents for device follow-up and is still in persistent AF/AFL.  He overall is not significantly symptomatic and his device site is well healed.  He was added on today for evaluation of his persistent atrial arrhythmias for further evaluation.  ROS: palpitations   Studies Reviewed  TEE Result date: 03/04/24 1. Left ventricular ejection fraction, by estimation, is 35 to 40%. The  left ventricle has moderately decreased function. The left ventricle  demonstrates global hypokinesis. Left ventricular diastolic function could  not be evaluated.   2. Right ventricular systolic function is mildly reduced. The right  ventricular size is normal. The estimated right ventricular systolic  pressure is 35.0 mmHg.   3. Left atrial size was moderately dilated. No left atrial/left atrial  appendage thrombus was detected. The LAA emptying velocity was 23 cm/s.   4. Right atrial size was mildly dilated.   5. The mitral valve is normal in structure. Trivial mitral valve  regurgitation. No evidence of mitral stenosis.   6. Possibly a functionally bicuspid aortic valve. There is a calcified  raphe between the left and noncoronary cusps. The aortic valve is  abnormal. There is mild calcification of the aortic valve. There is mild  thickening of the aortic valve. Aortic  valve regurgitation is not visualized. Aortic valve  sclerosis/calcification is present, without any evidence of aortic  stenosis.   7. There is Moderate (Grade III) plaque  involving the descending aorta.   cMRI  Result date: 02/29/24 1. LVEF 35% in the setting of AF RVR. 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 In correlation with lab testing showing no acute infarction this admission, this is less likely a viable territory. 2.   RVEF 31% in the setting of AF RVR. 3.  Bilateral pleural effusions incidentally noted.  Risk Assessment/Calculations  CHA2DS2-VASc Score = 6  This indicates a 9.7% annual risk of stroke. The patient's score is based upon: CHF History: 1 HTN History: 1 Diabetes History: 1 Stroke History: 2 Vascular Disease History: 0 Age Score: 1 Gender Score: 0   Physical Exam VS:  BP 97/67 (BP Location: Left Arm, Patient Position: Sitting, Cuff Size: Large)   Pulse (!) 119   Ht 5' 10 (1.778 m)   Wt 276 lb (125.2 kg)   SpO2 99%   BMI 39.60 kg/m       Wt Readings from Last 3 Encounters:  03/15/24 276 lb (125.2 kg)  03/05/24 262 lb 2 oz (118.9 kg)  01/05/24 259 lb 14.4 oz (117.9 kg)    GEN: Well nourished, well developed in no acute distress NECK: No JVD; No carotid bruits CARDIAC: irregular rhyth, accelerated rate, no murmurs, rubs, gallops RESPIRATORY:  Clear to auscultation without rales, wheezing or rhonchi  ABDOMEN: Soft, non-tender, non-distended EXTREMITIES:  No edema; No deformity  SKIN: LEFT infraclavicular incision well healed, no erythema or swelling  ASSESSMENT AND PLAN Mike Hunter is a 73 y.o. male with CAD s/p LAD PCI (10/2023), HFrEF/ICM, sustained VT s/p 2' prevention LEFT BSCI DC ICD (DOI 03/01/24), PVD s/p L ICA (05/2023), HTN, HLD, COPD, OSA on CPAP, neuropathy, DM2, BPD, CKD3, and HPpEF who presents for arrhythmia management.  Persistent atrial fibrillation Persistent atrial flutter VT s/p LEFT BSCI DC ICD (2' prevention) Returns today for device follow-up.  He overall is asymptomatic and is in AFL/AF and RVR.  Unable to interrogate device parameters with him in atrial arrhythmias with accelerated ventricular response.  He has now been on several weeks of amiodarone  and should have a better chance of success with cardioversion.  He had TEE on 03/04/2024 and has not interrupted anticoagulation since.  Plan for cardioversion on Friday.  If he is refractory to amiodarone   then will coordinate for outpatient ablation for better rhythm management.  I would like to avoid going straight to AVNA since he has just been in persistent AF since recent hospitalization.  No recurrent VT on device interrogation while taking amiodarone .  He continues on Xarelto  for stroke risk reduction and is also on Plavix  with PCI in May.  Will try to get to 6 months before discontinuing concomitant Plavix .   Informed Consent   Shared Decision Making/Informed Consent{  The risks (stroke, cardiac arrhythmias rarely resulting in the need for a temporary or permanent pacemaker, skin irritation or burns and complications associated with conscious sedation including aspiration, arrhythmia, respiratory failure and death), benefits (restoration of normal sinus rhythm) and alternatives of a direct current cardioversion were explained in detail to Mr. Maciolek and he agrees to proceed.   Dispo: RTC 3 months, will f/u post DCCV  A total of 40 minutes was spent preparing for the patient, reviewing history, performing exam, document encounter, coordinating care and counseling the patient. 20 minutes was spent with direct patient care.   Signed, [name], MD

## 2024-03-15 NOTE — Patient Instructions (Addendum)
 Medication Instructions:  Your physician recommends that you continue on your current medications as directed. Please refer to the Current Medication list given to you today.  *If you need a refill on your cardiac medications before your next appointment, please call your pharmacy*  Lab Work: None ordered.  If you have labs (blood work) drawn today and your tests are completely normal, you will receive your results only by: MyChart Message (if you have MyChart) OR A paper copy in the mail If you have any lab test that is abnormal or we need to change your treatment, we will call you to review the results.  Testing/Procedures: Your physician has recommended that you have a Cardioversion (DCCV). Electrical Cardioversion uses a jolt of electricity to your heart either through paddles or wired patches attached to your chest. This is a controlled, usually prescheduled, procedure. Defibrillation is done under light anesthesia in the hospital, and you usually go home the day of the procedure. This is done to get your heart back into a normal rhythm. You are not awake for the procedure. Please see the instruction sheet given to you today.   Follow-Up: At [facility], you and your health needs are our priority.  As part of our continuing mission to provide you with exceptional heart care, our providers are all part of one team.  This team includes your primary Cardiologist (physician) and Advanced Practice Providers or APPs (Physician Assistants and Nurse Practitioners) who all work together to provide you with the care you need, when you need it.  Your next appointment:   As scheduled     Dear Mike Hunter  You are scheduled for a Cardioversion on Friday, October 3 with Dr. Kriste.  Please arrive at the [facility] (Main Entrance A) at [facility]: [address] [place], [place] [address] at 8am (This time is 1.0 hour(s) before your procedure to ensure your preparation).   Free  valet parking service is available. You will check in at ADMITTING.   *Please Note: You will receive a call the day before your procedure to confirm the appointment time. That time may have changed from the original time based on the schedule for that day.*   DIET:  Nothing to eat or drink after midnight except a sip of water with medications (see medication instructions below)  MEDICATION INSTRUCTIONS: !!IF ANY NEW MEDICATIONS ARE STARTED AFTER TODAY, PLEASE NOTIFY YOUR PROVIDER AS SOON AS POSSIBLE!!    Continue taking your anticoagulant (blood thinner): Rivaroxaban  (Xarelto ) and Plavix .  You will need to continue this after your procedure until you are told by your provider that it is safe to stop.  Take 1/2 of your regular dose of Tresiba  the day before your procedure.   You may take your regular dose of Novalog.  ** No insulin  the morning of the procedure  You may take your other medications with sips of water to get them down safely.  LABS: none   FYI:  For your safety, and to allow us  to monitor your vital signs accurately during the surgery/procedure we request: If you have artificial nails, gel coating, SNS etc, please have those removed prior to your surgery/procedure. Not having the nail coverings /polish removed may result in cancellation or delay of your surgery/procedure.  Your support person will be asked to wait in the waiting room during your procedure.  It is OK to have someone drop you off and come back when you are ready to be  discharged.  You cannot drive after the procedure and will need someone to drive you home.  Bring your insurance cards.  *Special Note: Every effort is made to have your procedure done on time. Occasionally there are emergencies that occur at the hospital that may cause delays. Please be patient if a delay does occur.

## 2024-03-15 NOTE — Progress Notes (Signed)
 Dual chamber ICD wound check. Wound well healed. Presenting rhythm: AFib/flutter/VS with RVR.  Unable to test atrial or ventricular threshold.  Impedance and sensing WNL.  Pt seen by Dr. Almetta and scheduled for cardioversion. Reviewed arm restrictions to continue for 6 weeks total post op. Reviewed shock plan.  Pt enrolled in remote follow-up. Will see Pt next week post DCCV for device check for thresholds.

## 2024-03-15 NOTE — Patient Instructions (Signed)
 Increase your metoprolol  succinate 50 mg-  Take one tablet by mouth twice a day Continue taking your amiodarone  200 mg-  Take one tablet by mouth twice a day until after your cardioversion   After Your ICD (Implantable Cardiac Defibrillator)    Monitor your defibrillator site for redness, swelling, and drainage. Call the device clinic at [phone] if you experience these symptoms or fever/chills.  Your incision was closed with Steri-strips or staples:  You may shower 7 days after your procedure and wash your incision with soap and water. Avoid lotions, ointments, or perfumes over your incision until it is well-healed.  You may use a hot tub or a pool after your wound check appointment if the incision is completely closed.  Do not lift, push or pull greater than 10 pounds with the affected arm until 6 weeks after your procedure. There are no other restrictions in arm movement after your wound check appointment.  Your ICD is designed to protect you from life threatening heart rhythms. Because of this, you may receive a shock.   1 shock with no symptoms:  Call the office during business hours. 1 shock with symptoms (chest pain, chest pressure, dizziness, lightheadedness, shortness of breath, overall feeling unwell):  Call 911. If you experience 2 or more shocks in 24 hours:  Call 911. If you receive a shock, you should not drive.  [place] DMV - no driving for 6 months if you receive appropriate therapy from your ICD.   ICD Alerts:  Some alerts are vibratory and others beep. These are NOT emergencies. Please call our office to let us  know. If this occurs at night or on weekends, it can wait until the next business day. Send a remote transmission.  If your device is capable of reading fluid status (for heart failure), you will be offered monthly monitoring to review this with you.   Remote monitoring is used to monitor your ICD from home. This monitoring is scheduled every 91 days by our  office. It allows us  to keep an eye on the functioning of your device to ensure it is working properly. You will routinely see your Electrophysiologist annually (more often if necessary).

## 2024-03-17 NOTE — Progress Notes (Signed)
 Spoke to patient and instructed them to come at 0800  and to be NPO after 0000.     Confirmed that patient will have a ride home and someone to stay with them for 24 hours after the procedure.   Medications reviewed.  Confirmed blood thinner.  Confirmed no breaks in taking blood thinner for 3+ weeks prior to procedure. Confirmed patient stopped all GLP-1s and GLP-2s for at least one week before procedure.

## 2024-03-18 ENCOUNTER — Encounter (HOSPITAL_COMMUNITY)
Admission: RE | Disposition: A | Discharge status: Home / Self Care | Payer: Self-pay | Source: Home / Self Care | Attending: Internal Medicine

## 2024-03-18 ENCOUNTER — Ambulatory Visit (HOSPITAL_COMMUNITY)
Admission: RE | Admit: 2024-03-18 | Discharge: 2024-03-18 | Disposition: A | Discharge status: Home / Self Care | Attending: Internal Medicine | Admitting: Internal Medicine

## 2024-03-18 ENCOUNTER — Ambulatory Visit (HOSPITAL_COMMUNITY): Admitting: Anesthesiology

## 2024-03-18 DIAGNOSIS — Z539 Procedure and treatment not carried out, unspecified reason: Secondary | ICD-10-CM | POA: Diagnosis not present

## 2024-03-18 DIAGNOSIS — I4819 Other persistent atrial fibrillation: Secondary | ICD-10-CM | POA: Diagnosis present

## 2024-03-18 SURGERY — CARDIOVERSION (CATH LAB)
Anesthesia: Monitor Anesthesia Care

## 2024-03-18 NOTE — Progress Notes (Signed)
 Patient presented to the CVL lab today for an elective DCCV.   Upon presentation, he was noted to be in a regular atrial paced rhythm without atrial fibrillation. DCCV was cancelled and he was discharged in stable condition. Dr. Almetta, the ordering cardiologist, was notified.   Patient can continue his current medication regimen and follow up as scheduled.   Emeline Calender, DO

## 2024-03-21 ENCOUNTER — Telehealth: Payer: Self-pay | Admitting: *Deleted

## 2024-03-21 NOTE — Telephone Encounter (Signed)
 Patient called into clinic and stated that he was shocked over the weekend on Saturday 03/19/24 and was calling in to let us  know  Patient confessed to this RN that he was feeling Very SOB with CP and extreme Lethargy after shock event  This RN reminded the patient about their SHOCK PLAN magnet and wallet card given to him at their 2 week post op visit in the device clinic and reviewed what actions to take with them if event occurs again during the weekend  Patient verbalized understanding of re-education provided to him by this RN  ED precautions reviewed with patient and patient verbalized understanding  Patient verbalized compliance with all of his prescribed medications  This RN was unable to see any new transmission on Latitude since Sept 2025  RN requested manual transmission from patient  Transmission did not come through on Latitude or Paceart  Conference call initiated with [employer] tech support and patient to assist with monitoring device issue  Patient's device antenna re calibrated and alert transmission with ICD shock event received and reviewed by this RN  Event EGM c/w AFL/AF with RVR unsuccessfully treated by 1 round of ATP and then converted with 41J shock x1 which appears to be inappropriate   VT-1 detection/treatment zone set at 150bpm, VT 170bpm, and VF 200bpm  Spoke with [employer] device rep Joey regarding ICD shock event and device program settings  Patient's device shocked him based on ventricular rate and not based off morphology per device rep Joey  Device rep recommendations include: - Increasing VF detection rate from 200 bpm to 220 bpm - Increasing VT detection rate from 170 bpm to 190 bpm   Patient scheduled to be seen tomorrow in device clinic with Randall, RN for device reprogramming

## 2024-03-22 ENCOUNTER — Ambulatory Visit

## 2024-03-22 ENCOUNTER — Encounter: Payer: Self-pay | Admitting: Nurse Practitioner

## 2024-03-22 ENCOUNTER — Ambulatory Visit: Attending: Nurse Practitioner | Admitting: Nurse Practitioner

## 2024-03-22 VITALS — BP 112/54 | HR 60 | Ht 70.0 in | Wt 269.5 lb

## 2024-03-22 DIAGNOSIS — I480 Paroxysmal atrial fibrillation: Secondary | ICD-10-CM

## 2024-03-22 DIAGNOSIS — I2510 Atherosclerotic heart disease of native coronary artery without angina pectoris: Secondary | ICD-10-CM

## 2024-03-22 DIAGNOSIS — I5020 Unspecified systolic (congestive) heart failure: Secondary | ICD-10-CM

## 2024-03-22 DIAGNOSIS — Z794 Long term (current) use of insulin: Secondary | ICD-10-CM

## 2024-03-22 DIAGNOSIS — I472 Ventricular tachycardia, unspecified: Secondary | ICD-10-CM

## 2024-03-22 DIAGNOSIS — I10 Essential (primary) hypertension: Secondary | ICD-10-CM

## 2024-03-22 DIAGNOSIS — E119 Type 2 diabetes mellitus without complications: Secondary | ICD-10-CM

## 2024-03-22 DIAGNOSIS — I255 Ischemic cardiomyopathy: Secondary | ICD-10-CM | POA: Diagnosis not present

## 2024-03-22 DIAGNOSIS — I358 Other nonrheumatic aortic valve disorders: Secondary | ICD-10-CM

## 2024-03-22 DIAGNOSIS — E785 Hyperlipidemia, unspecified: Secondary | ICD-10-CM

## 2024-03-22 DIAGNOSIS — N1832 Chronic kidney disease, stage 3b: Secondary | ICD-10-CM

## 2024-03-22 DIAGNOSIS — J449 Chronic obstructive pulmonary disease, unspecified: Secondary | ICD-10-CM

## 2024-03-22 DIAGNOSIS — G4733 Obstructive sleep apnea (adult) (pediatric): Secondary | ICD-10-CM

## 2024-03-22 LAB — CUP PACEART INCLINIC DEVICE CHECK
Date Time Interrogation Session: 20251007152721
HighPow Impedance: 54 Ohm
HighPow Impedance: 68 Ohm
Implantable Lead Connection Status: 753985
Implantable Lead Connection Status: 753985
Implantable Lead Implant Date: 20250916
Implantable Lead Implant Date: 20250916
Implantable Lead Location: 753859
Implantable Lead Location: 753860
Implantable Lead Model: 673
Implantable Lead Model: 7841
Implantable Lead Serial Number: 1653982
Implantable Lead Serial Number: 272899
Implantable Pulse Generator Implant Date: 20250916
Lead Channel Impedance Value: 414 Ohm
Lead Channel Impedance Value: 628 Ohm
Lead Channel Pacing Threshold Amplitude: 0.6 V
Lead Channel Pacing Threshold Amplitude: 1.4 V
Lead Channel Pacing Threshold Pulse Width: 0.4 ms
Lead Channel Pacing Threshold Pulse Width: 0.4 ms
Lead Channel Sensing Intrinsic Amplitude: 12.2 mV
Lead Channel Sensing Intrinsic Amplitude: 4.6 mV
Lead Channel Setting Pacing Amplitude: 3.5 V
Lead Channel Setting Pacing Amplitude: 3.5 V
Lead Channel Setting Pacing Pulse Width: 0.4 ms
Lead Channel Setting Sensing Sensitivity: 0.5 mV
Pulse Gen Serial Number: 703705
Zone Setting Status: 755011

## 2024-03-22 NOTE — Patient Instructions (Signed)
 Medication Instructions:  Your physician recommends that you continue on your current medications as directed. Please refer to the Current Medication list given to you today.  *If you need a refill on your cardiac medications before your next appointment, please call your pharmacy*  Lab Work: None If you have labs (blood work) drawn today and your tests are completely normal, you will receive your results only by: MyChart Message (if you have MyChart) OR A paper copy in the mail If you have any lab test that is abnormal or we need to change your treatment, we will call you to review the results.  Testing/Procedures: None  Follow-Up: At [facility], you and your health needs are our priority.  As part of our continuing mission to provide you with exceptional heart care, our providers are all part of one team.  This team includes your primary Cardiologist (physician) and Advanced Practice Providers or APPs (Physician Assistants and Nurse Practitioners) who all work together to provide you with the care you need, when you need it.  Your next appointment:   2-3 month(s)  Provider:   Dr. Francyne   We recommend signing up for the patient portal called MyChart.  Sign up information is provided on this After Visit Summary.  MyChart is used to connect with patients for Virtual Visits (Telemedicine).  Patients are able to view lab/test results, encounter notes, upcoming appointments, etc.  Non-urgent messages can be sent to your provider as well.   To learn more about what you can do with MyChart, go to [email or web address].   Other Instructions None

## 2024-03-22 NOTE — Progress Notes (Signed)
 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 Presenting Rhythm: AP/VS 60 . Routine testing of thresholds, sensing, and impedance demonstrate stable parameters. Reprogrammed VT/VF zones per Dr. Almetta.  Pt enrolled in remote follow-up.  Programmed VF Detection Rate 200 bpm -> 220 bpm  Programmed VT Detection Rate 170 bpm-> 190 bpm

## 2024-03-22 NOTE — Progress Notes (Signed)
 Office Visit    Patient Name: Mike Hunter Date of Encounter: 03/22/2024  Primary Care Provider:  [name], [name], MD Primary Cardiologist:  Jerel Balding, MD  Chief Complaint    73 year old male with a history of CAD s/p PCI/DES x 2 overlapping-LAD in 10/2023, HFrEF, ICM, sustained VT s/p left BSCI DC ICD in 02/2024, paroxysmal atrial fibrillation aortic sclerosis, PVD s/p L carotid artery stent, hypertension, hyperlipidemia, type 2 diabetes, CKD stage IIIb, COPD, OSA, bipolar disorder, and obesity.who presents for follow-up related to CAD, heart failure, and atrial fibrillation.  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   High cholesterol    History of methicillin resistant staphylococcus aureus (MRSA)    Hypertension    Reflux    Renal disorder    Sleep apnea    Past Surgical History:  Procedure Laterality Date   CARDIOVERSION N/A 08/26/2023   Procedure: CARDIOVERSION;  Surgeon: Barbaraann Darryle Ned, MD;  Location: [facility] INVASIVE CV LAB;  Service: Cardiovascular;  Laterality: N/A;   CARDIOVERSION N/A 02/26/2024   Procedure: CARDIOVERSION;  Surgeon: Lonni Slain, MD;  Location: [facility] INVASIVE CV LAB;  Service: Cardiovascular;  Laterality: N/A;   CARDIOVERSION N/A 03/04/2024   Procedure: CARDIOVERSION;  Surgeon: Balding Jerel, MD;  Location: [facility] INVASIVE CV LAB;  Service: Cardiovascular;  Laterality: N/A;   CATARACT EXTRACTION     CERVICAL FUSION     CHOLECYSTECTOMY     ICD IMPLANT N/A 03/01/2024   Procedure: ICD IMPLANT;  Surgeon: Kennyth Chew, MD;  Location: [facility] INVASIVE CV LAB;  Service: Cardiovascular;  Laterality: N/A;   KIDNEY STONE SURGERY     KNEE ARTHROSCOPY     TRANSESOPHAGEAL ECHOCARDIOGRAM (CATH LAB) N/A  03/04/2024   Procedure: TRANSESOPHAGEAL ECHOCARDIOGRAM;  Surgeon: Balding Jerel, MD;  Location: [facility] INVASIVE CV LAB;  Service: Cardiovascular;  Laterality: N/A;    Allergies  Allergies  Allergen Reactions   Buprenorphine Hcl Nausea And Vomiting    Other reaction(s): Nausea And Vomiting    Hydromorphone  Other (See Comments)    Other reaction(s): Mental Status Changes (intolerance) Other reaction(s): HALLUCINATIONS   Codeine Nausea And Vomiting   Mirtazapine     Other reaction(s): Other (See Comments) Nightmares & altered mental status    Morphine  And Codeine Nausea And Vomiting     Labs/Other Studies Reviewed    The following studies were reviewed today:  Cardiac Studies & Procedures   ______________________________________________________________________________________________     ECHOCARDIOGRAM  ECHOCARDIOGRAM COMPLETE 02/26/2024  Narrative ECHOCARDIOGRAM REPORT    Patient Name:   Mike Hunter Date of Exam: 02/26/2024 Medical Rec #:  [id number]     Height:       70.0 in Accession #:    [id number]    Weight:       275.1 lb Date of Birth:  Dec 29, 1950      BSA:          2.390 m Patient Age:    73 years      BP:           111/75 mmHg Patient Gender: M             HR:  87 bpm. Exam Location:  Inpatient  Procedure: 2D Echo and Intracardiac Opacification Agent (Both Spectral and Color Flow Doppler were utilized during procedure).  Indications:     Dyspnea  History:         Patient has prior history of Echocardiogram examinations. Aortic Valve Disease, Arrythmias:Atrial Fibrillation; Signs/Symptoms:Dyspnea.  Sonographer:     Charmaine Gaskins Referring Phys:  [id number] A THOMAS Diagnosing Phys: Lonni Nanas MD  IMPRESSIONS   1. Left ventricular ejection fraction, by estimation, is 45 to 50%. The left ventricle has mildly decreased function. The left ventricle demonstrates global hypokinesis. The left ventricular internal cavity size was  mildly dilated. Left ventricular diastolic parameters are indeterminate. 2. Right ventricular systolic function is normal. The right ventricular size is normal. There is mildly elevated pulmonary artery systolic pressure. The estimated right ventricular systolic pressure is 37.8 mmHg. 3. Left atrial size was mildly dilated. 4. The mitral valve is normal in structure. Mild mitral valve regurgitation. 5. The aortic valve is calcified. Aortic valve regurgitation is not visualized. Mild to moderate aortic valve stenosis. Vmax 2.8 m/s, MG 21 mmHg, AVA 1.6 cm^2, DI 0.45 6. The inferior vena cava is normal in size with greater than 50% respiratory variability, suggesting right atrial pressure of 3 mmHg.  FINDINGS Left Ventricle: Left ventricular ejection fraction, by estimation, is 45 to 50%. The left ventricle has mildly decreased function. The left ventricle demonstrates global hypokinesis. Definity  contrast agent was given IV to delineate the left ventricular endocardial borders. The left ventricular internal cavity size was mildly dilated. There is no left ventricular hypertrophy. Left ventricular diastolic parameters are indeterminate.  Right Ventricle: The right ventricular size is normal. No increase in right ventricular wall thickness. Right ventricular systolic function is normal. There is mildly elevated pulmonary artery systolic pressure. The tricuspid regurgitant velocity is 2.95 m/s, and with an assumed right atrial pressure of 3 mmHg, the estimated right ventricular systolic pressure is 37.8 mmHg.  Left Atrium: Left atrial size was mildly dilated.  Right Atrium: Right atrial size was normal in size.  Pericardium: Trivial pericardial effusion is present.  Mitral Valve: The mitral valve is normal in structure. Mild mitral valve regurgitation.  Tricuspid Valve: The tricuspid valve is normal in structure. Tricuspid valve regurgitation is mild.  Aortic Valve: The aortic valve is calcified.  Aortic valve regurgitation is not visualized. Mild to moderate aortic stenosis is present. Aortic valve mean gradient measures 18.6 mmHg. Aortic valve peak gradient measures 27.5 mmHg. Aortic valve area, by VTI measures 1.39 cm.  Pulmonic Valve: The pulmonic valve was not well visualized. Pulmonic valve regurgitation is not visualized.  Aorta: The aortic root and ascending aorta are structurally normal, with no evidence of dilitation.  Venous: The inferior vena cava is normal in size with greater than 50% respiratory variability, suggesting right atrial pressure of 3 mmHg.  IAS/Shunts: The atrial septum is grossly normal.   LEFT VENTRICLE PLAX 2D LVIDd:         6.00 cm      Diastology LVIDs:         4.60 cm      LV e' medial:    7.87 cm/s LV PW:         0.90 cm      LV E/e' medial:  14.2 LV IVS:        1.00 cm      LV e' lateral:   8.01 cm/s LVOT diam:     2.20 cm  LV E/e' lateral: 13.9 LV SV:         77 LV SV Index:   32 LVOT Area:     3.80 cm  LV Volumes (MOD) LV vol d, MOD A2C: 122.0 ml LV vol d, MOD A4C: 121.0 ml LV vol s, MOD A2C: 43.7 ml LV vol s, MOD A4C: 76.9 ml LV SV MOD A2C:     78.3 ml LV SV MOD A4C:     121.0 ml LV SV MOD BP:      64.1 ml  RIGHT VENTRICLE RV Basal diam:  3.50 cm RV Mid diam:    3.60 cm RV S prime:     11.20 cm/s  LEFT ATRIUM             Index        RIGHT ATRIUM           Index LA diam:        4.80 cm 2.01 cm/m   RA Area:     19.10 cm LA Vol (A2C):   89.8 ml 37.57 ml/m  RA Volume:   47.80 ml  20.00 ml/m LA Vol (A4C):   87.9 ml 36.77 ml/m LA Biplane Vol: 91.9 ml 38.45 ml/m AORTIC VALVE AV Area (Vmax):    1.52 cm AV Area (Vmean):   1.39 cm AV Area (VTI):     1.39 cm AV Vmax:           262.40 cm/s AV Vmean:          209.000 cm/s AV VTI:            0.554 m AV Peak Grad:      27.5 mmHg AV Mean Grad:      18.6 mmHg LVOT Vmax:         104.92 cm/s LVOT Vmean:        76.200 cm/s LVOT VTI:          0.202 m LVOT/AV VTI ratio:  0.36  AORTA Ao Asc diam: 3.40 cm  MITRAL VALVE                TRICUSPID VALVE MV Area (PHT): 4.70 cm     TR Peak grad:   34.8 mmHg MV Decel Time: 161 msec     TR Vmax:        295.00 cm/s MV E velocity: 111.67 cm/s SHUNTS Systemic VTI:  0.20 m Systemic Diam: 2.20 cm  Lonni Nanas MD Electronically signed by Lonni Nanas MD Signature Date/Time: 02/26/2024/2:34:07 PM    Final (Updated)   TEE  ECHO TEE 03/04/2024  Narrative TRANSESOPHOGEAL ECHO REPORT    Patient Name:   Trevian C Frison Date of Exam: 03/04/2024 Medical Rec #:  [id number]     Height:       70.0 in Accession #:    [id number]    Weight:       275.1 lb Date of Birth:  03/24/1951      BSA:          2.390 m Patient Age:    73 years      BP:           102/70 mmHg Patient Gender: M             HR:           103 bpm. Exam Location:  Inpatient  Procedure: Color Doppler, Cardiac Doppler and Transesophageal Echo (Both Spectral and Color Flow Doppler were utilized during procedure).  Indications:  Cardioversion  History:        Patient has prior history of Echocardiogram examinations, most recent 02/26/2024.  Sonographer:    Tinnie Gosling RDCS Referring Phys: [phone] MIHAI CROITORU  PROCEDURE: After discussion of the risks and benefits of a TEE, an informed consent was obtained. The transesophogeal probe was passed without difficulty through the esophogus of the patient. Sedation performed by different physician. The patient was monitored while under deep sedation. Anesthestetic sedation was provided intravenously by Anesthesiology: 159mg  of Propofol , 60mg  of Lidocaine . The patient developed no complications during the procedure.  IMPRESSIONS   1. Left ventricular ejection fraction, by estimation, is 35 to 40%. The left ventricle has moderately decreased function. The left ventricle demonstrates global hypokinesis. Left ventricular diastolic function could not be evaluated. 2. Right ventricular systolic  function is mildly reduced. The right ventricular size is normal. The estimated right ventricular systolic pressure is 35.0 mmHg. 3. Left atrial size was moderately dilated. No left atrial/left atrial appendage thrombus was detected. The LAA emptying velocity was 23 cm/s. 4. Right atrial size was mildly dilated. 5. The mitral valve is normal in structure. Trivial mitral valve regurgitation. No evidence of mitral stenosis. 6. Possibly a functionally bicuspid aortic valve. There is a calcified raphe between the left and noncoronary cusps. The aortic valve is abnormal. There is mild calcification of the aortic valve. There is mild thickening of the aortic valve. Aortic valve regurgitation is not visualized. Aortic valve sclerosis/calcification is present, without any evidence of aortic stenosis. 7. There is Moderate (Grade III) plaque involving the descending aorta.  FINDINGS Left Ventricle: Left ventricular ejection fraction, by estimation, is 35 to 40%. The left ventricle has moderately decreased function. The left ventricle demonstrates global hypokinesis. The left ventricular internal cavity size was normal in size. There is no left ventricular hypertrophy. Left ventricular diastolic function could not be evaluated due to atrial fibrillation. Left ventricular diastolic function could not be evaluated.   LV Wall Scoring: The posterior wall and basal inferior segment are severely hypokinetic, on a background of mild global hypokinesis.  Right Ventricle: The right ventricular size is normal. No increase in right ventricular wall thickness. Right ventricular systolic function is mildly reduced. The tricuspid regurgitant velocity is 2.60 m/s, and with an assumed right atrial pressure of 8 mmHg, the estimated right ventricular systolic pressure is 35.0 mmHg.  Left Atrium: Left atrial size was moderately dilated. No left atrial/left atrial appendage thrombus was detected. The LAA emptying velocity was  23 cm/s.  Right Atrium: Right atrial size was mildly dilated.  Pericardium: There is no evidence of pericardial effusion.  Mitral Valve: The mitral valve is normal in structure. Trivial mitral valve regurgitation. No evidence of mitral valve stenosis.  Tricuspid Valve: The tricuspid valve is normal in structure. Tricuspid valve regurgitation is trivial.  Aortic Valve: Possibly a functionally bicuspid aortic valve. There is a calcified raphe between the left and noncoronary cusps. The aortic valve is abnormal. There is mild calcification of the aortic valve. There is mild thickening of the aortic valve. Aortic valve regurgitation is not visualized. Aortic valve sclerosis/calcification is present, without any evidence of aortic stenosis.  Pulmonic Valve: The pulmonic valve was grossly normal. Pulmonic valve regurgitation is not visualized. No evidence of pulmonic stenosis.  Aorta: The aortic root, ascending aorta, aortic arch and descending aorta are all structurally normal, with no evidence of dilitation or obstruction. There is moderate (Grade III) plaque involving the descending aorta.  IAS/Shunts: No atrial level shunt detected  by color flow Doppler.  Additional Comments: A device lead is visualized in the right ventricle.  TRICUSPID VALVE TR Peak grad:   27.0 mmHg TR Vmax:        260.00 cm/s  Jerel Balding MD Electronically signed by Jerel Balding MD Signature Date/Time: 03/04/2024/3:37:26 PM    Final      CARDIAC MRI  MR CARDIAC MORPHOLOGY W WO CONTRAST 02/29/2024  Narrative CLINICAL DATA:  Clinical question of scar formation Study assumes HCT of 32 and BSA of 2.49 m2.  EXAM: CARDIAC MRI  TECHNIQUE: The patient was scanned on a 1.5 Tesla GE magnet. A dedicated cardiac coil was used. Functional imaging was done using Fiesta sequences. 2,3, and 4 chamber views were done to assess for RWMA's. Modified Simpson's rule using was used to calculate an ejection fraction  on a dedicated work [employer]. The patient received 10 cc of Gadavist . After 10 minutes inversion recovery sequences were used to assess for infiltration and scar tissue. Flow quantification was performed 2 times during this examination with flow quantification performed at the levels of the ascending aorta above the valve, pulmonary artery above the valve.  CONTRAST:  10 cc  of Gadavist   FINDINGS: 1. Normal left ventricular size, with LVEDD 55 mm, and LVEDVi 59 mL/m2.  Normal left ventricular thickness.  Moderate decrease ventricular systolic function (LVEF =35%). Patient is free breathing and in a irregular rapid rhythm. Grossly suggestive of global hypokinesis. Unable to perform accurate strain.  Left ventricular parametric mapping notable for elevated native T1 in the basal lateral (1372 ms), poor post contrast T1 comparison for ECV, and increased T2 in the basal lateral (59 ms).  There is late gadolinium enhancement in the left ventricular myocardium, that is subendocardial, in the inferior lateral wall. This accounts for 75% of the wall.  Cannot exclude abnormal perfusion in the mid inferolateral.  2. Normal right ventricular size with RVEDVI 53 mL/m2.  Normal right ventricular thickness.  Moderate decrease in the right ventricular systolic function (RVEF =31%). There are no regional wall motion abnormalities or aneurysms but global hypokinesis.  3.  Normal right atrial size with left atrial enlargement.  4. Normal size of the aortic root, ascending aorta. Moderate dilation of the main pulmonary artery, 31 mm.  5. Valve assessment:  Aortic Valve: Tri-leaflet and calcified valve. Mean gradient 4 mm Hg using anti-aliasing. Regurgitant fraction of 21% may overestimate regurgitation in the setting of RVR acquisition.  Pulmonic Valve: Mild regurgitation, regurgitant fraction of 8% may overestimate regurgitation in the setting of RVR  acquisition.  Tricuspid Valve: Sub-optimal view of valve morphology. Qualitatively mild, central regurgitation.  Mitral Valve: Mild central regurgitation. Regurgitant fraction of 13%.  6. Normal pericardium. Small basal lateral pericardial effusion without evidence of tamponade physiology.  7. Grossly, bilateral pleural effusion noted but no extracardiac findings. Recommended dedicated study if concerned for non-cardiac pathology.  8. Difficult acquisition in the setting of free breathing and AF RVR.  IMPRESSION: 1. LVEF 35% in the setting of AF RVR. 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 In correlation with lab testing showing no acute infarction this admission, this is less likely a viable territory.  2.  RVEF 31% in the setting of AF RVR.  3.  Bilateral pleural effusions incidentally noted.  Stanly Leavens MD   Electronically Signed By: Stanly Leavens M.D. On: 02/29/2024 15:57   ______________________________________________________________________________________________     Recent Labs: 02/25/2024: Pro Brain Natriuretic Peptide  2,137.0 02/26/2024: TSH 1.871 02/27/2024: B Natriuretic Peptide 222.0 03/07/2024: Magnesium  2.3 03/09/2024: ALT 22; BUN 33; Creatinine, Ser 1.65; Hemoglobin 10.2; Platelets 367; Potassium 3.5; Sodium 136  Recent Lipid Panel    Component Value Date/Time   CHOL  09/05/2009 0412    124        ATP III CLASSIFICATION:  <200     mg/dL   Desirable  799-760  mg/dL   Borderline High  >=759    mg/dL   High          TRIG 812 (H) 09/05/2009 0412   HDL 32 (L) 09/05/2009 0412   CHOLHDL 3.9 09/05/2009 0412   VLDL 37 09/05/2009 0412   LDLCALC  09/05/2009 0412    55        Total Cholesterol/HDL:CHD Risk Coronary Heart Disease Risk Table                     Men   Women  1/2 Average Risk   3.4   3.3  Average Risk       5.0   4.4  2 X Average Risk   9.6   7.1  3 X Average Risk  23.4   11.0         Use the calculated Patient Ratio above and the CHD Risk Table to determine the patient's CHD Risk.        ATP III CLASSIFICATION (LDL):  <100     mg/dL   Optimal  899-870  mg/dL   Near or Above                    Optimal  130-159  mg/dL   Borderline  839-810  mg/dL   High  >809     mg/dL   Very High    History of Present Illness    73 year old male with the above past medical history including CAD s/p PCI/DES x 2 overlapping-LAD in 10/2023, HFrEF, ICM, sustained VT s/p left BSCI DC ICD in 02/2024, paroxysmal atrial fibrillation, aortic sclerosis, PVD s/p L carotid artery stent, hypertension, hyperlipidemia, type 2 diabetes, CKD stage IIIb, COPD, OSA, bipolar disorder, and obesity.  He has been followed by [facility] cardiology.  He previously underwent cardiac catheterization in 01/2018 through [facility] that showed mild nonobstructive coronary artery disease with normal LV function, EF 65%.  Echocardiogram in 10/2019 showed EF 55-60% with mild LVH, normal wall motion, no significant valvular abnormalities. There was a small to moderate size pericardial effusion with no tamponade physiology present. He was hospitalized in 07/2020 in the setting of atrial fibrillation. Echocardiogram in 07/2020 showed EF 55-60%.  A nuclear stress test in 10/2021 showed normal myocardial uptake both with stress and at rest, no evidence for inducible ischemia, EF 55%.  Repeat echocardiogram in 02/2022 showed EF 55-60%, mild LVH, mild aortic stenosis, mild mitral regurgitation. Stress test in 02/2023 in the setting of chest tightness showed no reversible ischemia or infarction, normal LV wall motion, EF 51%.  Echocardiogram in 02/2023 showed EF 55-60% with mild mitral regurgitation, no pericardial effusion.  Cardiac monitor in 03/2023 showed predominantly normal sinus rhythm with 6 runs of SVT.  He later had a dobutamine stress echocardiogram in 05/2023 which showed evidence of inducible ischemia, moderate  aortic stenosis.  Ongoing medical therapy was recommended. He was admitted to [facility] in 05/2023 with pneumonia.  Echocardiogram in 05/2023 showed EF 60-65%, no regional wall motion abnormalities, grade 3 diastolic  dysfunction, moderately enlarged RV, small pericardial effusion present, mild-moderate aortic valve regurgitation. He was hospitalized in 08/2023 in the setting of atrial fibrillation with RVR. He underwent successful DCCV. Cardiac catheterization in 10/2023 revealed borderline mid LAD stenosis (RFR 0.73) s/p PCI, DES x 2 overlapping stents-LAD.  This was done at [facility]. He was hospitalized in 7/25 in the setting of community-acquired pneumonia, he was noted to be in atrial fibrillation with RVR, he spontaneously converted to normal sinus rhythm.  He was hospitalized in September 2025 in the setting of atrial fibrillation with RVR.  Echocardiogram in 02/2023 showed EF 45 to 50%, global hypokinesis, indeterminate diastolic parameters.  He underwent DCCV on 02/26/2024 but had early recurrence of atrial fibrillation.  EP was consulted. He was noted to have wide-complex tachycardia on telemetry.  He was started on amiodarone . Cardiac MRI showed EF 35%, inferolateral LGE consistent with prior infarct, RV EF also reduced at 31%.  He ultimately underwent ICD implantation on 03/01/2024. He underwent failed TEE/DCCV on 03/04/2024.  TEE revealed EF was 35 to 40%, moderately decreased LV function, mildly reduced RV systolic function, there was no evidence of thrombus, possibly a functionally bicuspid aortic valve, there was mild calcification of the aortic valve with mild thickening, no evidence of aortic stenosis, grade 3 plaque involving the descending aorta.  Outpatient cardioversion was recommended in 3 to 4 weeks.  He was last seen in the office on 03/15/2024 by Dr. Almetta, EP.  He was scheduled for outpatient DCCV on 03/18/2024 but was noted to be in atrial paced rhythm without evidence of atrial  fibrillation.  Procedure was canceled.  Of note he has a history of carotid artery stenosis s/p left carotid artery stent, followed by Dr. Rockey Chaco of [facility].  He has a history of COPD, OSA, on home O2 PM, follows with pulmonology.   He presents today for follow-up.  I also spoke with his wife by phone per patient request.  Since his last visit he has been stable from a cardiac standpoint.  He received a shock from his ICD on 03/19/2024.  This was in the setting of atrial fibrillation with RVR.  Device interrogation per device clinic RN in office today shows normal sinus rhythm.  He reports intermittent chest discomfort, shortness of breath at rest in the setting of personal stress.  He reports generalized fatigue.  He states he has good days and bad days during which he feels sluggish, achy in his chest, and depressed. He denies any palpitations, dizziness, PND, orthopnea, weight gain, edema.  He request to establish with Dr. Francyne for general cardiology, he is no longer following with cardiology at [facility].  Home Medications    Current Outpatient Medications  Medication Sig Dispense Refill   albuterol  (PROVENTIL  HFA;VENTOLIN  HFA) 108 (90 BASE) MCG/ACT inhaler Inhale 2 puffs into the lungs every 4 (four) hours as needed for wheezing.     amiodarone  (PACERONE ) 200 MG tablet Take 1 tablet (200 mg total) by mouth 2 (two) times daily. 60 tablet 3   atorvastatin  (LIPITOR ) 80 MG tablet Take 1 tablet (80 mg total) by mouth daily. 30 tablet 0   azithromycin  (ZITHROMAX ) 250 MG tablet Take 250 mg by mouth 3 (three) times a week.     budesonide  (PULMICORT ) 0.5 MG/2ML nebulizer solution Take 2 mLs by nebulization 2 (two) times daily.     clopidogrel  (PLAVIX ) 75 MG tablet Take 75 mg by mouth daily.     dapagliflozin propanediol (FARXIGA) 10 MG TABS  tablet Take 10 mg by mouth daily.     donepezil  (ARICEPT ) 10 MG tablet Take 10 mg by mouth at bedtime.     insulin  aspart (NOVOLOG  FLEXPEN) 100  UNIT/ML injection Inject 0-13 Units into the skin 3 (three) times daily with meals.     Insulin  Degludec (TRESIBA  FLEXTOUCH [place]) Inject 40-46 Units into the skin See admin instructions. 40 morning, 46 night     iron polysaccharides (NIFEREX) 150 MG capsule Take 150 mg by mouth 2 (two) times daily.     latanoprost  (XALATAN ) 0.005 % ophthalmic solution Place 1 drop into both eyes at bedtime.       losartan  (COZAAR ) 25 MG tablet Take 1 tablet (25 mg total) by mouth daily. 90 tablet 2   memantine  (NAMENDA ) 10 MG tablet Take 10 mg by mouth 2 (two) times daily.     metoprolol  succinate (TOPROL -XL) 50 MG 24 hr tablet Take 1 tablet (50 mg total) by mouth in the morning and at bedtime. Take with or immediately following a meal.     mirabegron  ER (MYRBETRIQ ) 50 MG TB24 tablet Take 50 mg by mouth daily.     montelukast  (SINGULAIR ) 10 MG tablet Take 10 mg by mouth at bedtime.     Multiple Vitamin (MULTIVITAMIN WITH MINERALS) TABS tablet Take 1 tablet by mouth daily.     Multiple Vitamins-Minerals (PRESERVISION AREDS PO) Take 2 tablets by mouth daily.     omeprazole (PRILOSEC) 40 MG capsule Take 1 capsule by mouth at bedtime.     oxybutynin  (DITROPAN -XL) 10 MG 24 hr tablet Take 10 mg by mouth daily.     sertraline  (ZOLOFT ) 100 MG tablet Take 100 mg by mouth daily.     spironolactone  (ALDACTONE ) 25 MG tablet Take 25 mg by mouth daily.     Suvorexant (BELSOMRA) 15 MG TABS Take 15 mg by mouth at bedtime.     Teriparatide  560 MCG/2.24ML SOPN Inject 2.24 mLs into the skin at bedtime.     torsemide  (DEMADEX ) 20 MG tablet Take 1 tablet (20 mg total) by mouth daily.     WIXELA INHUB 250-50 MCG/ACT AEPB Inhale 1 puff into the lungs in the morning and at bedtime.     XARELTO  15 MG TABS tablet Take 15 mg by mouth daily.     No current facility-administered medications for this visit.     Review of Systems    He denies palpitations, pnd, orthopnea, n, v, dizziness, syncope, edema, weight gain, or early satiety. All  other systems reviewed and are otherwise negative except as noted above.   Physical Exam    VS:  BP (!) 112/54 (Cuff Size: Normal)   Pulse 60   Ht 5' 10 (1.778 m)   Wt 269 lb 8 oz (122.2 kg)   SpO2 98%   BMI 38.67 kg/m   GEN: Well nourished, well developed, in no acute distress. HEENT: normal. Neck: Supple, no JVD, carotid bruits, or masses. Cardiac: RRR, no murmurs, rubs, or gallops. No clubbing, cyanosis, edema.  Radials/DP/PT 2+ and equal bilaterally.  Respiratory:  Respirations regular and unlabored, clear to auscultation bilaterally. GI: Soft, nontender, nondistended, BS + x 4. MS: no deformity or atrophy. Skin: warm and dry, no rash. Neuro:  Strength and sensation are intact. Psych: Normal affect.  Accessory Clinical Findings    ECG personally reviewed by me today - EKG Interpretation Date/Time:  Tuesday March 22 2024 14:51:50 EDT Ventricular Rate:  60 PR Interval:  196 QRS Duration:  102 QT  Interval:  478 QTC Calculation: 478 R Axis:   94  Text Interpretation: Atrial-paced rhythm Rightward axis When compared with ECG of 18-Mar-2024 07:56, No significant change was found Confirmed by Daneen Perkins ([id number]) on 03/22/2024 3:00:48 PM  - no acute changes.   Lab Results  Component Value Date   WBC 11.3 (H) 03/09/2024   HGB 10.2 (L) 03/09/2024   HCT 34.3 (L) 03/09/2024   MCV 74.1 (L) 03/09/2024   PLT 367 03/09/2024   Lab Results  Component Value Date   CREATININE 1.65 (H) 03/09/2024   BUN 33 (H) 03/09/2024   NA 136 03/09/2024   K 3.5 03/09/2024   CL 103 03/09/2024   CO2 22 03/09/2024   Lab Results  Component Value Date   ALT 22 03/09/2024   AST 17 03/09/2024   ALKPHOS 84 03/09/2024   BILITOT 0.7 03/09/2024   Lab Results  Component Value Date   CHOL  09/05/2009    124        ATP III CLASSIFICATION:  <200     mg/dL   Desirable  799-760  mg/dL   Borderline High  >=759    mg/dL   High          HDL 32 (L) 09/05/2009   LDLCALC  09/05/2009    55         Total Cholesterol/HDL:CHD Risk Coronary Heart Disease Risk Table                     Men   Women  1/2 Average Risk   3.4   3.3  Average Risk       5.0   4.4  2 X Average Risk   9.6   7.1  3 X Average Risk  23.4   11.0        Use the calculated Patient Ratio above and the CHD Risk Table to determine the patient's CHD Risk.        ATP III CLASSIFICATION (LDL):  <100     mg/dL   Optimal  899-870  mg/dL   Near or Above                    Optimal  130-159  mg/dL   Borderline  839-810  mg/dL   High  >809     mg/dL   Very High   TRIG 812 (H) 09/05/2009   CHOLHDL 3.9 09/05/2009    Lab Results  Component Value Date   HGBA1C 6.7 (H) 01/04/2024    Assessment & Plan    1. CAD: S/p PCI/DES x 2 overlapping-LAD in 10/2023, otherwise nonobstructive CAD.  He notes intermittent chest pain, shortness of breath at rest in the setting of increased personal stress, generalized fatigue.  He denies exertional symptoms.  Continue to monitor.  Reviewed ED precautions.  Continue Plavix , metoprolol , losartan , spironolactone , torsemide , Farxiga, and Lipitor .    2. HFrEF/ICM/VT: Cardiac MRI showed EF 35%, inferolateral LGE consistent with prior infarct, RV EF also reduced at 31%.  S/p left [facility] IDC ICD per EP in 02/2024 in the setting of sustained VT.  Most recent TEE as below, EF 35 to 40%.  Euvolemic and well compensated on exam.  Consider repeat echocardiogram in 3 to 6 months, will defer to MD.  Continue metoprolol , losartan , Farxiga, spironolactone , and torsemide .  3. Paroxysmal atrial fibrillation: Diagnosed in 2022.  He has been maintained on amiodarone  and Xarelto .  S/p DCCV in 08/2023.  He had recurrent  atrial fibrillation with RVR during hospitalization in 01/03/2024 and again in 02/2024.  He underwent DCCV on 02/26/2024 with early recurrence of atrial fibrillation.  He underwent failed TEE/DCCV on 03/04/2024.  He received a shock from his ICD on 03/19/2024 in the setting of atrial fibrillation with RVR.  He  denies any palpitations, dizziness, presyncope or syncope.  He does report intermittent chest discomfort, shortness of breath at rest and in the setting of increased stress. Denies bleeding. Device interrogation per device clinic RN in office today showed normal sinus rhythm.  He is following with Dr. Almetta, EP, pending evaluation for possible ablation.  Recent labs CBC, CMET, TSH and magnesium  in 02/2024 were stable.  Reviewed ED precautions.  Continue metoprolol , amiodarone , Xarelto .  4. Valvular heart disease: Mild to moderate aortic valve stenosis as well as mild mitral valve regurgitation noted on echocardiogram in 02/2024. TEE on 03/04/2024 revealed EF was 35 to 40%, moderately decreased LV function, mildly reduced RV systolic function, there was no evidence of thrombus, trivial mitral valve regurgitation, possibly a functionally bicuspid aortic valve, there was mild calcification of the aortic valve with mild thickening, no evidence of aortic stenosis, grade 3 plaque involving the descending aorta.  Consider repeat echocardiogram in 3 to 6 months as above.  5. Carotid artery stenosis:  S/p L carotid artery stent.  Asymptomatic.  Follows with Dr. Sheryle, neurosurgery at [facility].  On Plavix , Lipitor .  6. Hypertension:BP well controlled. Continue current antihypertensive regimen.   7. Hyperlipidemia: LDL was 53 in 11/2023.  Continue Lipitor .  8. Type 2 diabetes: A1c was 6.7 in 12/2023.  On insulin .  Follows with Dr. Lionel Molt, endocrinology at [facility].  9. CKD stage IIIb: Creatinine was stable at 1.65 in 02/2024.  10. COPD: On nightly home O2.  Follows with Prentice Graven, PA, pulmonology at [facility].  11. OSA: Adherent to CPAP.  Managed per pulmonology.  12. Disposition: Follow-up 2 to 3 months with Dr. Francyne, follow-up with Dr. Almetta as scheduled in 05/2024.      Damien JAYSON Braver, NP 03/22/2024, 5:32 PM

## 2024-03-22 NOTE — Patient Instructions (Signed)
 Follow up as scheduled.

## 2024-03-22 NOTE — Telephone Encounter (Signed)
 Reviewed recommended changes with Dr. Almetta.  He agrees with recommendations.   Additionally he will contact Pt to discuss AF ablation.

## 2024-03-24 ENCOUNTER — Telehealth: Payer: Self-pay | Admitting: Student in an Organized Health Care Education/Training Program

## 2024-03-24 NOTE — Telephone Encounter (Signed)
 Spoke to [name] about the ablation for Mike Hunter.  He was unavailable to talk today.  He had been interested in considering this during our last discussion.  Now that he has had inappropriate therapies for AF while on therapeutic AC of amiodarone  I think that it is even more reasonable to proceed with catheter ablation.  I explained that he could continue on amiodarone  which may keep his AF controlled or we can go ahead and proceed with ablation with the hopes of try to get him on a low-dose of amiodarone  if possible.  He also has VT so for now I would continue the amiodarone  either way.  In the future though we may need to come off of the amiodarone  and knowing which arrhythmia we need to deal with would be helpful.  He and his wife are going to consider these options and let me know if they want to proceed with ablation or just continue on antiarrhythmic therapy and consider in the future if his symptoms worsen or persist.  [name], MD [facility]  Cardiac Electrophysiology

## 2024-03-28 ENCOUNTER — Telehealth: Payer: Self-pay | Admitting: Nurse Practitioner

## 2024-03-28 NOTE — Telephone Encounter (Signed)
 Call to patient to address concerns. Patient states he is confused about what dose of torsemide  he should be on, he states he has been taking it twice a day. Advised that med rec states it was decreased to once a day during a recent hospital stay 02/25/24 through 03/09/24.   Patient states he has a medicine bottle that says to take his toprol  once a day, but med rec says it was changed to BID on 03/15/24. Patient states his HR was 92 today, he does not check his BP. He states he has been taking his toprol  once a day and needs a refill but doesn't know what quantity to refill it for.   Patient is also requesting a call from E. Monge NP as patient has questions about what kind of heart failure he has, he states he has paperwork from his insurance that he needs to fill out.

## 2024-03-28 NOTE — Telephone Encounter (Signed)
 Pt requesting a c/b to ask several questions about what kind of heart failure he has

## 2024-03-29 NOTE — Telephone Encounter (Signed)
 Spoke with pts spouse. Spouse wouldn't let pt get the phone and after going over some of the information given by Damien Braver NP, pts spouse said she had another call. When asked if she would pass this message on to her spouse, she said he may call or may not call back.

## 2024-04-01 NOTE — Telephone Encounter (Signed)
 Spoke with pt regarding Damien Braver, NP recommendations. Pt verbalizes understanding of these. Pt states that he was supposed to the in touch with Dr. Almetta to discuss option for ablation. Per chart review pt's wife spoke with Dr. Almetta on 10/9 regarding the ablation. Pt states that he has been sick this whole week and in result of that he has experienced issues with his blood sugar and heart rate range going from 50-150bpm. Pt states that he is absolutely interesting in having the ablation and would like to know steps moving forward. Advised that I would relay his message to Dr. Almetta to get the ball rolling. Pt expresses that he would like to have ablation done as soon as possible. Also, discussed possibly having pt see A-fib clinic to work on better heart rate control will send to Dr. Almetta to advise. Pt verbalizes understanding and is extremely motivated to work on next step regarding his a-fib.

## 2024-04-01 NOTE — Telephone Encounter (Signed)
 Pt requesting a c/b to him directly.

## 2024-04-04 ENCOUNTER — Telehealth: Payer: Self-pay | Admitting: Student in an Organized Health Care Education/Training Program

## 2024-04-04 DIAGNOSIS — I480 Paroxysmal atrial fibrillation: Secondary | ICD-10-CM

## 2024-04-04 DIAGNOSIS — Z01812 Encounter for preprocedural laboratory examination: Secondary | ICD-10-CM

## 2024-04-04 NOTE — Telephone Encounter (Signed)
 Reviewed 10/17 note, seems like patient would like to move forward with ablation.

## 2024-04-04 NOTE — Telephone Encounter (Signed)
 Pt calling to f/u on 10/17 conversation regarding ablation. He has been expecting a callback but has not yet received one. Please advise.

## 2024-04-05 ENCOUNTER — Encounter (HOSPITAL_BASED_OUTPATIENT_CLINIC_OR_DEPARTMENT_OTHER): Payer: Self-pay

## 2024-04-05 ENCOUNTER — Other Ambulatory Visit: Payer: Self-pay

## 2024-04-05 ENCOUNTER — Inpatient Hospital Stay (HOSPITAL_BASED_OUTPATIENT_CLINIC_OR_DEPARTMENT_OTHER)
Admission: EM | Admit: 2024-04-05 | Discharge: 2024-04-07 | DRG: 191 | Disposition: A | Discharge status: Home / Self Care | Attending: Internal Medicine | Admitting: Internal Medicine

## 2024-04-05 ENCOUNTER — Emergency Department (HOSPITAL_BASED_OUTPATIENT_CLINIC_OR_DEPARTMENT_OTHER)

## 2024-04-05 DIAGNOSIS — D509 Iron deficiency anemia, unspecified: Secondary | ICD-10-CM | POA: Diagnosis not present

## 2024-04-05 DIAGNOSIS — E7800 Pure hypercholesterolemia, unspecified: Secondary | ICD-10-CM | POA: Diagnosis present

## 2024-04-05 DIAGNOSIS — R000 Tachycardia, unspecified: Secondary | ICD-10-CM | POA: Diagnosis not present

## 2024-04-05 DIAGNOSIS — B9789 Other viral agents as the cause of diseases classified elsewhere: Secondary | ICD-10-CM | POA: Diagnosis present

## 2024-04-05 DIAGNOSIS — F03918 Unspecified dementia, unspecified severity, with other behavioral disturbance: Secondary | ICD-10-CM | POA: Diagnosis present

## 2024-04-05 DIAGNOSIS — Z87442 Personal history of urinary calculi: Secondary | ICD-10-CM

## 2024-04-05 DIAGNOSIS — E119 Type 2 diabetes mellitus without complications: Secondary | ICD-10-CM

## 2024-04-05 DIAGNOSIS — Z6839 Body mass index (BMI) 39.0-39.9, adult: Secondary | ICD-10-CM

## 2024-04-05 DIAGNOSIS — T380X5A Adverse effect of glucocorticoids and synthetic analogues, initial encounter: Secondary | ICD-10-CM | POA: Diagnosis present

## 2024-04-05 DIAGNOSIS — N1832 Chronic kidney disease, stage 3b: Secondary | ICD-10-CM | POA: Diagnosis not present

## 2024-04-05 DIAGNOSIS — I5022 Chronic systolic (congestive) heart failure: Secondary | ICD-10-CM | POA: Diagnosis not present

## 2024-04-05 DIAGNOSIS — K219 Gastro-esophageal reflux disease without esophagitis: Secondary | ICD-10-CM | POA: Diagnosis present

## 2024-04-05 DIAGNOSIS — Z79899 Other long term (current) drug therapy: Secondary | ICD-10-CM

## 2024-04-05 DIAGNOSIS — Z8614 Personal history of Methicillin resistant Staphylococcus aureus infection: Secondary | ICD-10-CM

## 2024-04-05 DIAGNOSIS — E782 Mixed hyperlipidemia: Secondary | ICD-10-CM

## 2024-04-05 DIAGNOSIS — R0600 Dyspnea, unspecified: Principal | ICD-10-CM | POA: Diagnosis present

## 2024-04-05 DIAGNOSIS — R0602 Shortness of breath: Secondary | ICD-10-CM | POA: Diagnosis present

## 2024-04-05 DIAGNOSIS — H409 Unspecified glaucoma: Secondary | ICD-10-CM | POA: Diagnosis present

## 2024-04-05 DIAGNOSIS — Z87891 Personal history of nicotine dependence: Secondary | ICD-10-CM

## 2024-04-05 DIAGNOSIS — F319 Bipolar disorder, unspecified: Secondary | ICD-10-CM | POA: Diagnosis present

## 2024-04-05 DIAGNOSIS — M810 Age-related osteoporosis without current pathological fracture: Secondary | ICD-10-CM | POA: Diagnosis present

## 2024-04-05 DIAGNOSIS — G4733 Obstructive sleep apnea (adult) (pediatric): Secondary | ICD-10-CM | POA: Diagnosis present

## 2024-04-05 DIAGNOSIS — I480 Paroxysmal atrial fibrillation: Secondary | ICD-10-CM | POA: Diagnosis present

## 2024-04-05 DIAGNOSIS — F0393 Unspecified dementia, unspecified severity, with mood disturbance: Secondary | ICD-10-CM | POA: Diagnosis present

## 2024-04-05 DIAGNOSIS — E66812 Obesity, class 2: Secondary | ICD-10-CM | POA: Diagnosis present

## 2024-04-05 DIAGNOSIS — B348 Other viral infections of unspecified site: Secondary | ICD-10-CM | POA: Diagnosis present

## 2024-04-05 DIAGNOSIS — Z7951 Long term (current) use of inhaled steroids: Secondary | ICD-10-CM

## 2024-04-05 DIAGNOSIS — E785 Hyperlipidemia, unspecified: Secondary | ICD-10-CM | POA: Diagnosis present

## 2024-04-05 DIAGNOSIS — I130 Hypertensive heart and chronic kidney disease with heart failure and stage 1 through stage 4 chronic kidney disease, or unspecified chronic kidney disease: Secondary | ICD-10-CM | POA: Diagnosis present

## 2024-04-05 DIAGNOSIS — Z1152 Encounter for screening for COVID-19: Secondary | ICD-10-CM

## 2024-04-05 DIAGNOSIS — J441 Chronic obstructive pulmonary disease with (acute) exacerbation: Principal | ICD-10-CM | POA: Diagnosis present

## 2024-04-05 DIAGNOSIS — E1122 Type 2 diabetes mellitus with diabetic chronic kidney disease: Secondary | ICD-10-CM | POA: Diagnosis present

## 2024-04-05 DIAGNOSIS — Z7901 Long term (current) use of anticoagulants: Secondary | ICD-10-CM

## 2024-04-05 DIAGNOSIS — Z794 Long term (current) use of insulin: Secondary | ICD-10-CM

## 2024-04-05 DIAGNOSIS — J069 Acute upper respiratory infection, unspecified: Principal | ICD-10-CM | POA: Diagnosis present

## 2024-04-05 DIAGNOSIS — E1165 Type 2 diabetes mellitus with hyperglycemia: Secondary | ICD-10-CM | POA: Diagnosis present

## 2024-04-05 DIAGNOSIS — Z7902 Long term (current) use of antithrombotics/antiplatelets: Secondary | ICD-10-CM

## 2024-04-05 DIAGNOSIS — R32 Unspecified urinary incontinence: Secondary | ICD-10-CM | POA: Diagnosis present

## 2024-04-05 HISTORY — DX: Chronic obstructive pulmonary disease, unspecified: J44.9

## 2024-04-05 LAB — COMPREHENSIVE METABOLIC PANEL WITH GFR
ALT: 24 U/L (ref 0–44)
AST: 31 U/L (ref 15–41)
Albumin: 4 g/dL (ref 3.5–5.0)
Alkaline Phosphatase: 108 U/L (ref 38–126)
Anion gap: 15 (ref 5–15)
BUN: 33 mg/dL — ABNORMAL HIGH (ref 8–23)
CO2: 20 mmol/L — ABNORMAL LOW (ref 22–32)
Calcium: 9 mg/dL (ref 8.9–10.3)
Chloride: 103 mmol/L (ref 98–111)
Creatinine, Ser: 1.6 mg/dL — ABNORMAL HIGH (ref 0.61–1.24)
GFR, Estimated: 45 mL/min — ABNORMAL LOW (ref >60)
Glucose, Bld: 106 mg/dL — ABNORMAL HIGH (ref 70–99)
Potassium: 4.3 mmol/L (ref 3.5–5.1)
Sodium: 138 mmol/L (ref 135–145)
Total Bilirubin: 0.4 mg/dL (ref 0.0–1.2)
Total Protein: 7.6 g/dL (ref 6.5–8.1)

## 2024-04-05 LAB — RESPIRATORY PANEL BY PCR

## 2024-04-05 LAB — URINALYSIS, W/ REFLEX TO CULTURE (INFECTION SUSPECTED)
Bilirubin Urine: NEGATIVE
Glucose, UA: >=500 mg/dL — AB
Hgb urine dipstick: NEGATIVE
Ketones, ur: NEGATIVE mg/dL
Leukocytes,Ua: NEGATIVE
Nitrite: NEGATIVE
Protein, ur: NEGATIVE mg/dL
Specific Gravity, Urine: 1.01 (ref 1.005–1.030)
WBC, UA: NONE SEEN WBC/hpf (ref 0–5)
pH: 5.5 (ref 5.0–8.0)

## 2024-04-05 LAB — CBC WITH DIFFERENTIAL/PLATELET
Abs Immature Granulocytes: 0.05 K/uL (ref 0.00–0.07)
Basophils Absolute: 0.1 K/uL (ref 0.0–0.1)
Basophils Relative: 1 %
Eosinophils Absolute: 0.5 K/uL (ref 0.0–0.5)
Eosinophils Relative: 5 %
HCT: 35.4 % — ABNORMAL LOW (ref 39.0–52.0)
Hemoglobin: 10.4 g/dL — ABNORMAL LOW (ref 13.0–17.0)
Immature Granulocytes: 1 %
Lymphocytes Relative: 13 %
Lymphs Abs: 1.4 K/uL (ref 0.7–4.0)
MCH: 21.8 pg — ABNORMAL LOW (ref 26.0–34.0)
MCHC: 29.4 g/dL — ABNORMAL LOW (ref 30.0–36.0)
MCV: 74.2 fL — ABNORMAL LOW (ref 80.0–100.0)
Monocytes Absolute: 1 K/uL (ref 0.1–1.0)
Monocytes Relative: 9 %
Neutro Abs: 8 K/uL — ABNORMAL HIGH (ref 1.7–7.7)
Neutrophils Relative %: 71 %
Platelets: 447 K/uL — ABNORMAL HIGH (ref 150–400)
RBC: 4.77 MIL/uL (ref 4.22–5.81)
RDW: 18.5 % — ABNORMAL HIGH (ref 11.5–15.5)
WBC: 11.1 K/uL — ABNORMAL HIGH (ref 4.0–10.5)
nRBC: 0 % (ref 0.0–0.2)

## 2024-04-05 LAB — TROPONIN T, HIGH SENSITIVITY
Troponin T High Sensitivity: 24 ng/L — ABNORMAL HIGH (ref 0–19)
Troponin T High Sensitivity: 21 ng/L — ABNORMAL HIGH (ref 0–19)

## 2024-04-05 LAB — RESP PANEL BY RT-PCR (RSV, FLU A&B, COVID)  RVPGX2
Influenza A by PCR: NEGATIVE
Influenza B by PCR: NEGATIVE
Resp Syncytial Virus by PCR: NEGATIVE
SARS Coronavirus 2 by RT PCR: NEGATIVE

## 2024-04-05 LAB — LACTIC ACID, PLASMA: Lactic Acid, Venous: 1.5 mmol/L (ref 0.5–1.9)

## 2024-04-05 LAB — PRO BRAIN NATRIURETIC PEPTIDE: Pro Brain Natriuretic Peptide: 2191 pg/mL — ABNORMAL HIGH (ref <300.0)

## 2024-04-05 LAB — GLUCOSE, CAPILLARY: Glucose-Capillary: 305 mg/dL — ABNORMAL HIGH (ref 70–99)

## 2024-04-05 MED ORDER — TORSEMIDE 20 MG PO TABS
20.0000 mg | ORAL_TABLET | Freq: Two times a day (BID) | ORAL | Status: DC
  Administered 2024-04-06 – 2024-04-07 (×3): 20 mg via ORAL
  Filled 2024-04-05 (×4): qty 1

## 2024-04-05 MED ORDER — GUAIFENESIN ER 600 MG PO TB12
600.0000 mg | ORAL_TABLET | Freq: Two times a day (BID) | ORAL | Status: DC
  Administered 2024-04-05 – 2024-04-07 (×4): 600 mg via ORAL
  Filled 2024-04-05 (×4): qty 1

## 2024-04-05 MED ORDER — INSULIN ASPART 100 UNIT/ML IJ SOLN
0.0000 [IU] | Freq: Every day | INTRAMUSCULAR | Status: DC
  Administered 2024-04-05 – 2024-04-06 (×2): 4 [IU] via SUBCUTANEOUS

## 2024-04-05 MED ORDER — ACETAMINOPHEN 325 MG PO TABS: 650.0000 mg | ORAL_TABLET | Freq: Four times a day (QID) | ORAL | Status: DC | PRN

## 2024-04-05 MED ORDER — POLYSACCHARIDE IRON COMPLEX 150 MG PO CAPS
150.0000 mg | ORAL_CAPSULE | Freq: Two times a day (BID) | ORAL | Status: DC
  Administered 2024-04-06 – 2024-04-07 (×3): 150 mg via ORAL
  Filled 2024-04-05 (×3): qty 1

## 2024-04-05 MED ORDER — AZITHROMYCIN 250 MG PO TABS
500.0000 mg | ORAL_TABLET | ORAL | Status: AC
  Administered 2024-04-05: 500 mg via ORAL
  Filled 2024-04-05: qty 2

## 2024-04-05 MED ORDER — MONTELUKAST SODIUM 10 MG PO TABS
10.0000 mg | ORAL_TABLET | Freq: Every day | ORAL | Status: DC
  Administered 2024-04-06: 10 mg via ORAL
  Filled 2024-04-05: qty 1

## 2024-04-05 MED ORDER — ALBUTEROL SULFATE (2.5 MG/3ML) 0.083% IN NEBU
5.0000 mg | INHALATION_SOLUTION | Freq: Once | RESPIRATORY_TRACT | Status: AC
  Administered 2024-04-05: 5 mg via RESPIRATORY_TRACT
  Filled 2024-04-05: qty 6

## 2024-04-05 MED ORDER — ACETAMINOPHEN 650 MG RE SUPP: 650.0000 mg | Freq: Four times a day (QID) | RECTAL | Status: DC | PRN

## 2024-04-05 MED ORDER — CLOPIDOGREL BISULFATE 75 MG PO TABS
75.0000 mg | ORAL_TABLET | Freq: Every day | ORAL | Status: DC
  Administered 2024-04-06 – 2024-04-07 (×2): 75 mg via ORAL
  Filled 2024-04-05 (×2): qty 1

## 2024-04-05 MED ORDER — ONDANSETRON HCL 4 MG/2ML IJ SOLN: 4.0000 mg | Freq: Four times a day (QID) | INTRAMUSCULAR | Status: DC | PRN

## 2024-04-05 MED ORDER — ATORVASTATIN CALCIUM 40 MG PO TABS
80.0000 mg | ORAL_TABLET | Freq: Every day | ORAL | Status: DC
  Administered 2024-04-06 – 2024-04-07 (×2): 80 mg via ORAL
  Filled 2024-04-05 (×2): qty 2

## 2024-04-05 MED ORDER — SODIUM CHLORIDE 0.9 % IV BOLUS
500.0000 mL | Freq: Once | INTRAVENOUS | Status: AC
  Administered 2024-04-05: 500 mL via INTRAVENOUS

## 2024-04-05 MED ORDER — AMIODARONE HCL 200 MG PO TABS
200.0000 mg | ORAL_TABLET | Freq: Two times a day (BID) | ORAL | Status: DC
  Administered 2024-04-05 – 2024-04-07 (×4): 200 mg via ORAL
  Filled 2024-04-05 (×4): qty 1

## 2024-04-05 MED ORDER — RIVAROXABAN 15 MG PO TABS
15.0000 mg | ORAL_TABLET | Freq: Every day | ORAL | Status: DC
  Administered 2024-04-06: 15 mg via ORAL
  Filled 2024-04-05: qty 1

## 2024-04-05 MED ORDER — INSULIN GLARGINE-YFGN 100 UNIT/ML ~~LOC~~ SOPN
10.0000 [IU] | PEN_INJECTOR | Freq: Two times a day (BID) | SUBCUTANEOUS | Status: DC
  Filled 2024-04-05: qty 3

## 2024-04-05 MED ORDER — SPIRONOLACTONE 25 MG PO TABS
25.0000 mg | ORAL_TABLET | Freq: Every day | ORAL | Status: DC
  Administered 2024-04-06 – 2024-04-07 (×2): 25 mg via ORAL
  Filled 2024-04-05 (×2): qty 1

## 2024-04-05 MED ORDER — METOPROLOL SUCCINATE ER 50 MG PO TB24
50.0000 mg | ORAL_TABLET | Freq: Two times a day (BID) | ORAL | Status: DC
  Administered 2024-04-06 – 2024-04-07 (×3): 50 mg via ORAL
  Filled 2024-04-05 (×3): qty 1

## 2024-04-05 MED ORDER — LEVALBUTEROL HCL 1.25 MG/0.5ML IN NEBU: 1.2500 mg | INHALATION_SOLUTION | RESPIRATORY_TRACT | Status: DC | PRN

## 2024-04-05 MED ORDER — SODIUM CHLORIDE 0.9 % IV SOLN
2.0000 g | Freq: Once | INTRAVENOUS | Status: AC
  Administered 2024-04-05: 2 g via INTRAVENOUS
  Filled 2024-04-05: qty 20

## 2024-04-05 MED ORDER — METHYLPREDNISOLONE SODIUM SUCC 125 MG IJ SOLR
80.0000 mg | Freq: Two times a day (BID) | INTRAMUSCULAR | Status: DC
  Administered 2024-04-06 – 2024-04-07 (×3): 80 mg via INTRAVENOUS
  Filled 2024-04-05 (×3): qty 2

## 2024-04-05 MED ORDER — INSULIN ASPART 100 UNIT/ML IJ SOLN
0.0000 [IU] | Freq: Three times a day (TID) | INTRAMUSCULAR | Status: DC
  Administered 2024-04-06: 3 [IU] via SUBCUTANEOUS
  Administered 2024-04-06: 2 [IU] via SUBCUTANEOUS
  Administered 2024-04-06 – 2024-04-07 (×3): 9 [IU] via SUBCUTANEOUS

## 2024-04-05 MED ORDER — IPRATROPIUM-ALBUTEROL 0.5-2.5 (3) MG/3ML IN SOLN
6.0000 mL | RESPIRATORY_TRACT | Status: AC
Start: 2024-04-05 — End: 2024-04-05
  Administered 2024-04-05: 6 mL via RESPIRATORY_TRACT
  Filled 2024-04-05: qty 6

## 2024-04-05 MED ORDER — METHYLPREDNISOLONE SODIUM SUCC 125 MG IJ SOLR
125.0000 mg | INTRAMUSCULAR | Status: AC
  Administered 2024-04-05: 125 mg via INTRAVENOUS
  Filled 2024-04-05: qty 2

## 2024-04-05 MED ORDER — IPRATROPIUM-ALBUTEROL 0.5-2.5 (3) MG/3ML IN SOLN: 3.0000 mL | RESPIRATORY_TRACT | Status: DC

## 2024-04-05 MED ORDER — LEVALBUTEROL HCL 1.25 MG/0.5ML IN NEBU
1.2500 mg | INHALATION_SOLUTION | Freq: Four times a day (QID) | RESPIRATORY_TRACT | Status: DC
  Filled 2024-04-05 (×2): qty 0.5

## 2024-04-05 MED ORDER — BENZONATATE 100 MG PO CAPS
200.0000 mg | ORAL_CAPSULE | Freq: Three times a day (TID) | ORAL | Status: DC | PRN
  Administered 2024-04-07: 200 mg via ORAL
  Filled 2024-04-05: qty 2

## 2024-04-05 MED ORDER — IPRATROPIUM BROMIDE 0.02 % IN SOLN
0.5000 mg | Freq: Four times a day (QID) | RESPIRATORY_TRACT | Status: DC
  Administered 2024-04-06 (×3): 0.5 mg via RESPIRATORY_TRACT
  Filled 2024-04-05 (×3): qty 2.5

## 2024-04-05 MED ORDER — MELATONIN 3 MG PO TABS
3.0000 mg | ORAL_TABLET | Freq: Every evening | ORAL | Status: DC | PRN
  Administered 2024-04-06: 3 mg via ORAL
  Filled 2024-04-05: qty 1

## 2024-04-05 NOTE — Telephone Encounter (Signed)
 Patient reports, his chest is very heavy, he is experiencing a deep heavy cough, body aches, chest congestion, feel like I'm running a fever,  I feel like I have pneumonia. Symptoms stated approx. One week ago. Patient also reports, he has severe shortness of breath, severe chest pressure, and severe wheezing.  Patient is speaking in clear sentences, no audible wheezing noted during call. Patient denies severe difficulty breathing.   Denies severe difficulty breathing, chest pain.  Patient reports, he has COPD and Systolic heart failure. Patient reports, he is scheduled for an ablation soon. He also reports, he has been experiencing a lot of heart issues lately.   I have advised patient to be seen in the ED now. Patient reports, he intends to go to [facility] hospital now.

## 2024-04-05 NOTE — ED Notes (Signed)
 Pt in imaging

## 2024-04-05 NOTE — Sepsis Progress Note (Signed)
 Code Sepsis protocol being monitored by [facility].

## 2024-04-05 NOTE — ED Notes (Signed)
 Called [facility] for consult to [facility] @ 15:26.  Spoke with [employer]

## 2024-04-05 NOTE — ED Notes (Signed)
 Pts spouse, [place], notified of pt transfer to [facility]. Provided with room number. No further questions.

## 2024-04-05 NOTE — Telephone Encounter (Signed)
 Copied from CRM #[id number]. Topic: Clinical Concerns - Emergent Call >> Apr 05, 2024 12:31 PM Tracee S wrote: Mike Hunter, Mike Hunter is calling for clinical concerns (Ask: What symptoms are you calling about today, AND how long have you had these symptoms? Must Review HPKW list for symptoms) Document Name of Triage Nurse/BH Rep taking the call when applicable)   Include all details related to the request(s) below: Patient is calling with an emergent symptom-feels heavy chest. Patient states for the last week he has been experiencing cough, fever in the evenings 99-101.0, body aches, and states he has a heavy feeling in the chest and it is hard to breath.    Confirm and type the Best Contact Number below:  Patient/caller contact number:   [phone]          [] Home  [x] Mobile  [] Work [] Other   [x] Okay to leave a voicemail   Medication List:  Current Outpatient Medications:  .  amiodarone  (PACERONE ) 200 mg tablet, Take 200 mg by mouth., Disp: , Rfl:  .  atorvastatin  (LIPITOR ) 80 mg tablet, Take 1 tablet (80 mg total) by mouth daily., Disp: 90 tablet, Rfl: 3 .  azithromycin  (ZITHROMAX ) 250 mg tablet, Take 1 tablet (250 mg total) by mouth 3 (three) times a week., Disp: 12 tablet, Rfl: 11 .  budesonide  (PULMICORT ) 0.5 mg/2 mL nebulizer solution, Take 2 mL (0.5 mg total) by nebulization in the morning and 2 mL (0.5 mg total) before bedtime., Disp: 120 mL, Rfl: 3 .  calcium  carbonate (CALCIUM  600 ORAL), Take 1 tablet by mouth 2 (two) times a day., Disp: , Rfl:  .  clopidogreL  (PLAVIX ) 75 mg tablet, Take 1 tablet (75 mg total) by mouth daily., Disp: 90 tablet, Rfl: 3 .  dapagliflozin propanediol (FARXIGA) 10 mg tab tablet, Take 1 tablet (10 mg total) by mouth daily., Disp: 90 each, Rfl: 1 .  donepeziL  (ARICEPT ) 10 mg tablet, Take 1 tablet (10 mg total) by mouth nightly., Disp: 90 tablet, Rfl: 1 .  doxepin  (SINEquan ) 10 mg capsule, Take 1 to 2 at night for sleep, Disp: 60 capsule, Rfl: 5 .  glucagon  (Gvoke) 1 mg/0.2 mL soln, Inject 1 mg under the skin as needed., Disp: 0.2 mL, Rfl: 1 .  glucose blood (OneTouch Verio test strips) test strip, Use as instructed to check glucose 3 times daily, Disp: 100 strip, Rfl: 5 .  insulin  aspart U-100 (NovoLOG ) 100 unit/mL injection, Inject under the skin 3 (three) times a day before meals. Sliding scale., Disp: , Rfl:  .  insulin  degludec (TRESIBA  FlexTouch) 200 unit/mL (3 mL) pen, Inject 40 Units under the skin daily., Disp: , Rfl:  .  insulin  degludec (TRESIBA  FlexTouch) 200 unit/mL (3 mL) pen, Inject 50 Units under the skin at bedtime., Disp: , Rfl:  .  isosorbide  mononitrate (IMDUR ) 30 mg 24 hr tablet, TAKE ONE TABLET BY MOUTH ONE TIME DAILY, Disp: 180 tablet, Rfl: 0 .  Lancets misc, USE LANCETS TO CHECK BLOOD SUGAR 3 TIMES DAILY. DX: E11.42, Disp: 200 each, Rfl: 3 .  latanoprost  (XALATAN ) 0.005 % ophthalmic solution, Administer 1 drop into each eyes at bedtime., Disp: 2.5 mL, Rfl: 3 .  losartan  (COZAAR ) 25 mg tablet, Take 25 mg by mouth daily., Disp: , Rfl:  .  memantine  (NAMENDA ) 10 mg tablet, Take  1 twice a day, Disp: 180 tablet, Rfl: 1 .  metoprolol  succinate (TOPROL  XL) 50 mg 24 hr tablet, Take 50 mg by mouth., Disp: , Rfl:  .  mirabegron  (Myrbetriq ) 50 mg Tb24, Take 1 tablet (50 mg total) by mouth daily., Disp: 30 tablet, Rfl: 11 .  montelukast  (SINGULAIR ) 10 mg tablet, Take 1 tablet (10 mg total) by mouth daily., Disp: 90 tablet, Rfl: 1 .  multivitamin with folic acid 400 mcg tab, Take 1 tablet by mouth daily., Disp: , Rfl:  .  NON FORMULARY, CPAP - Use as directed nightly., Disp: , Rfl:  .  omeprazole (PriLOSEC) 40 mg DR capsule, TAKE ONE CAPSULE BY MOUTH EVERY EVENING, Disp: 90 capsule, Rfl: 1 .  OneTouch [employer] kit, Use to monitor BG as directed. Dx E11.65, Disp: 1 each, Rfl: 0 .  oxyBUTYnin  (DITROPAN  XL) 10 mg 24 hr tablet, TAKE 1 TABLET BY MOUTH EVERY DAY, Disp: 30 tablet, Rfl: 11 .  oxygen (O2) gas, Inhale 2-5 L/min  continuous. via nasal canula, Disp: , Rfl:  .  pen needle, diabetic 29 gauge x 1/2 ndle, USE AS DIRECTED 4 TIMES DAILY, Disp: 100 each, Rfl: 3 .  pen needle, diabetic 31 gauge x 3/16 ndle, 1 Application by miscellaneous route daily., Disp: 100 each, Rfl: 3 .  polysaccharide iron complex (NU-IRON) 150 mg iron capsule, TAKE ONE CAPSULE BY MOUTH TWICE A DAY, Disp: 180 capsule, Rfl: 1 .  rivaroxaban  (Xarelto ) 15 mg tablet, Take 1 tablet (15 mg total) by mouth daily with dinner., Disp: 90 tablet, Rfl: 3 .  sertraline  (ZOLOFT ) 100 mg tablet, Take 1 tablet by mouth daily., Disp: 90 tablet, Rfl: 1 .  spironolactone  (ALDACTONE ) 25 mg tablet, Take one tablet (25 mg total) by mouth daily., Disp: 90 tablet, Rfl: 1 .  teriparatide  (FORTEO ) 20 mcg/dose (557mcg/2.24mL) pnij injection, Inject 0.08 mL (20 mcg total) under the skin daily., Disp: 2.24 mL, Rfl: 10 .  torsemide  (DEMADEX ) 20 mg tablet, Take 1 tablet (20 mg total) by mouth 2 (two) times a day., Disp: 60 tablet, Rfl: 0 .  Ventolin  HFA 90 mcg/actuation inhaler, Inhale 2 puffs every 4 (four) hours as needed (wheezing)., Disp: 18 g, Rfl: 11 .  vit C/E/Zn/coppr/lutein/zeaxan (PRESERVISION AREDS-2 ORAL), Take 1 tablet by mouth daily., Disp: , Rfl:  .  Wixela Inhub 250-50 mcg/dose diskus inhaler, INHALE 1 PUFF BY MOUTH EVERY 12 HOURS, Disp: 60 each, Rfl: 5     Medication Request/Refills: Pharmacy Information (if applicable)   [x] Not Applicable       []  Pharmacy listed  Send Medication Request to:                                                 [] Pharmacy not listed (added to pharmacy list in Epic) Send Medication Request to:      Listed Pharmacies: [facility] [address] - [place], [place] - [address] - PHONE: [phone] - FAX: [phone] [facility] - [place], [place] - [address] - PHONE: [phone] - FAX: [phone]

## 2024-04-05 NOTE — ED Notes (Signed)
 Patient stated he wears 3L PRN and at bedtime and wants to take a nap. Requested to be placed on 3L. SAT was 97% prior to placement. Patient repositioned and lights dimmed.

## 2024-04-05 NOTE — ED Notes (Signed)
 Lab notified to add-on bnp to previously collected blood  sample.

## 2024-04-05 NOTE — ED Triage Notes (Signed)
 Chest heaviness and pain, SHOB, cough, chills for 1 week.  Denies N/V Tachypneic, appears SHOB after walking to triage

## 2024-04-05 NOTE — H&P (Incomplete)
 History and Physical      Mike Hunter FMW:[id number] DOB: 24-Feb-1951 DOA: 04/05/2024; DOS: 04/05/2024  PCP: [name], [name], MD  Patient coming from: home   I have personally briefly reviewed patient's old medical records in [facility]  Chief Complaint: Shortness of breath  HPI: Mike Hunter is a 73 y.o. male with medical history significant for type 2 diabetes mellitus, COPD, paroxysmal atrial fibrillation chronically anticoagulated on Xarelto , chronic systolic heart failure, CKD 3B with baseline creatinine 1.3-1.8, chronic iron deficiency anemia with baseline hemoglobin 9-11, who is admitted to [facility] on 04/05/2024 by way of transfer from [facility] with acute COPD exacerbation in the setting of viral lower respiratory tract infection after presenting from home to the latter facility complaining of shortness of breath.  The patient reports 1 week of shortness of breath associated with new productive cough along with subjective fever and chills in the absence of full body rigors.  He reports that shortness of breath is not been associate with any chest pain, palpitations, diaphoresis, nor any recent orthopnea, PND, or worsening of peripheral edema.  No hemoptysis.  No wheezing.  Denies any recent abdominal pain, dysuria, gross hematuria.  He has a history of COPD in the setting of being a former smoker, reports good compliance with outpatient respiratory regimen.  Denies any known baseline supplemental oxygen requirements.  He also has a history of chronic systolic heart failure, with echocardiogram in the form of TEE on 03/04/2024 showing LVEF 35 to 40%, indeterminate diastolic parameters, mildly reduced right ventricular systolic function, moderately dilated left atrium, moderately dilated right atrium, trivial mitral digitation, and no evidence of aortic regurgitation as well as no evidence of aortic stenosis.   Medical history also notable for paroxysmal  atrial fibrillation for which she is chronically anticoagulated on Xarelto .  Of note, he was reported to be hospitalized in early October 2025 at [facility] with acute on chronic systolic heart failure exacerbation.   [facility] ED Course:  Vital signs in the ED were notable for the following: Afebrile; heart rates in the 90s to low 100s; systolic blood pressures in the high 90s to 120s; respiratory rate 19-25, oxygen saturation 97 to 99% on room air.  Subsequently, the patient was started on 3 L nasal cannula for patient comfort, with ensuing oxygen saturation noted to be 100% on 3 L nasal cannula, without any objective hypoxia.  Labs were notable for the following: CMP was notable for the following: Sodium 138, potassium 4.3, creatinine 1.60 compared to 1.65 on 03/09/2024, glucose 106, liver enzymes were within normal limits.  proBNP 2191 compared to most recent prior value of 2137 on 02/25/2024, as well as 3395 on 12/29/2023.  High sensitive troponin I initially 24, with repeat value trending down to 21.  Lactic acid 1.5.  CBC notable for white blood cell count 11,100, hemoglobin 10.4 associated with microcytic/hypochromic properties and relative demonstration prior hemoglobin data point of 10.2 on 03/09/2024.  Urinalysis showed no white blood cells and was leukocyte esterase/nitrate negative.  Blood cultures x 2 were collected prior to initiation of IV antibiotics, COVID, influenza, RSV PCR were all negative.  Viral respiratory panel was positive for rhinovirus, but otherwise pan negative.  Per my interpretation, EKG in ED demonstrated the following: Atrial fibrillation with heart rate 100, no evidence of T wave or ST changes, including no evidence of ST elevation.  Imaging in the ED, per corresponding formal radiology read, was notable  for the following: 2 view chest x-ray showed no evidence of acute cardiopulmonary process, including no evidence of infiltrate, edema, effusion, or  pneumothorax.  While in the ED, the following were administered: Albuterol  nebulizer x 1, duo nebulizer x 1, azithromycin , Rocephin , Solu-Medrol  125 mg IV x 1 dose, normal saline x 500 cc bolus.  Subsequently, the patient was admitted for further evaluation management of acute COPD exacerbation in the setting of viral lower respiratory tract infection.     Review of Systems: As per HPI otherwise 10 point review of systems negative.   Past Medical History:  Diagnosis Date  . Asthma   . Atrial fibrillation (HCC)   . Bipolar affective (HCC)   . Cataract   . CHF (congestive heart failure) (HCC)   . Conversion disorder   . Cornea disorder   . Diabetes mellitus   . 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 High cholesterol   . History of methicillin resistant staphylococcus aureus (MRSA)   . Hypertension   . Reflux   . Renal disorder   . Sleep apnea     Past Surgical History:  Procedure Laterality Date  . CARDIOVERSION N/A 08/26/2023   Procedure: CARDIOVERSION;  Surgeon: Barbaraann Darryle Ned, MD;  Location: [facility] INVASIVE CV LAB;  Service: Cardiovascular;  Laterality: N/A;  . CARDIOVERSION N/A 02/26/2024   Procedure: CARDIOVERSION;  Surgeon: Lonni Slain, MD;  Location: [facility] INVASIVE CV LAB;  Service: Cardiovascular;  Laterality: N/A;  . CARDIOVERSION N/A 03/04/2024   Procedure: CARDIOVERSION;  Surgeon: Francyne Headland, MD;  Location: [facility] INVASIVE CV LAB;  Service: Cardiovascular;  Laterality: N/A;  . CATARACT EXTRACTION    . CERVICAL FUSION    . CHOLECYSTECTOMY    . ICD IMPLANT N/A 03/01/2024   Procedure: ICD IMPLANT;  Surgeon: Kennyth Chew, MD;  Location: [facility] INVASIVE CV LAB;  Service: Cardiovascular;  Laterality: N/A;  . KIDNEY STONE SURGERY    . KNEE ARTHROSCOPY    . TRANSESOPHAGEAL ECHOCARDIOGRAM (CATH LAB) N/A 03/04/2024   Procedure: TRANSESOPHAGEAL ECHOCARDIOGRAM;  Surgeon: Francyne Headland, MD;  Location: [facility] INVASIVE CV LAB;  Service: Cardiovascular;  Laterality:  N/A;    Social History:  reports that he has quit smoking. He has never used smokeless tobacco. He reports that he does not drink alcohol and does not use drugs.   Allergies  Allergen Reactions  . Buprenorphine Hcl Nausea And Vomiting    Other reaction(s): Nausea And Vomiting   . Hydromorphone  Other (See Comments)    Other reaction(s): Mental Status Changes (intolerance) Other reaction(s): HALLUCINATIONS  . Codeine Nausea And Vomiting  . Mirtazapine     Other reaction(s): Other (See Comments) Nightmares & altered mental status   . Morphine  And Codeine Nausea And Vomiting    History reviewed. No pertinent family history.   Prior to Admission medications   Medication Sig Start Date End Date Taking? Authorizing Provider  albuterol  (PROVENTIL  HFA;VENTOLIN  HFA) 108 (90 BASE) MCG/ACT inhaler Inhale 2 puffs into the lungs every 4 (four) hours as needed for wheezing.    [provider]  amiodarone  (PACERONE ) 200 MG tablet Take 1 tablet (200 mg total) by mouth 2 (two) times daily. 03/09/24   Patsy Lenis, MD  atorvastatin  (LIPITOR ) 80 MG tablet Take 1 tablet (80 mg total) by mouth daily. 01/06/24 03/22/24  [name], [name], MD  azithromycin  (ZITHROMAX ) 250 MG tablet Take 250 mg by mouth 3 (three) times a week. 02/23/24   [provider]  budesonide  (PULMICORT ) 0.5 MG/2ML  nebulizer solution Take 2 mLs by nebulization 2 (two) times daily.    [provider]  clopidogrel  (PLAVIX ) 75 MG tablet Take 75 mg by mouth daily. 05/18/23 05/17/24  [provider]  dapagliflozin propanediol (FARXIGA) 10 MG TABS tablet Take 10 mg by mouth daily.    [provider]  donepezil  (ARICEPT ) 10 MG tablet Take 10 mg by mouth at bedtime.    [provider]  insulin  aspart (NOVOLOG  FLEXPEN) 100 UNIT/ML injection Inject 0-13 Units into the skin 3 (three) times daily with meals.    [provider]  Insulin  Degludec (TRESIBA  FLEXTOUCH [place]) Inject 40-46  Units into the skin See admin instructions. 40 morning, 46 night    [provider]  iron polysaccharides (NIFEREX) 150 MG capsule Take 150 mg by mouth 2 (two) times daily. 02/25/24   [provider]  latanoprost  (XALATAN ) 0.005 % ophthalmic solution Place 1 drop into both eyes at bedtime.      [provider]  losartan  (COZAAR ) 25 MG tablet Take 1 tablet (25 mg total) by mouth daily. 03/09/24   Patsy Lenis, MD  memantine  (NAMENDA ) 10 MG tablet Take 10 mg by mouth 2 (two) times daily.    [provider]  metoprolol  succinate (TOPROL -XL) 50 MG 24 hr tablet Take 1 tablet (50 mg total) by mouth in the morning and at bedtime. Take with or immediately following a meal. 03/15/24   Almetta Donnice LABOR, MD  mirabegron  ER (MYRBETRIQ ) 50 MG TB24 tablet Take 50 mg by mouth daily. 02/09/24 02/03/25  [provider]  montelukast  (SINGULAIR ) 10 MG tablet Take 10 mg by mouth at bedtime.    [provider]  Multiple Vitamin (MULTIVITAMIN WITH MINERALS) TABS tablet Take 1 tablet by mouth daily.    [provider]  Multiple Vitamins-Minerals (PRESERVISION AREDS PO) Take 2 tablets by mouth daily.    [provider]  omeprazole (PRILOSEC) 40 MG capsule Take 1 capsule by mouth at bedtime. 02/02/17   [provider]  oxybutynin  (DITROPAN -XL) 10 MG 24 hr tablet Take 10 mg by mouth daily.    [provider]  sertraline  (ZOLOFT ) 100 MG tablet Take 100 mg by mouth daily.    [provider]  spironolactone  (ALDACTONE ) 25 MG tablet Take 25 mg by mouth daily.    [provider]  Suvorexant (BELSOMRA) 15 MG TABS Take 15 mg by mouth at bedtime.    [provider]  Teriparatide  560 MCG/2.24ML SOPN Inject 2.24 mLs into the skin at bedtime. 02/25/24   [provider]  torsemide  (DEMADEX ) 20 MG tablet Take 1 tablet (20 mg total) by mouth daily. 03/09/24   Patsy Lenis, MD  NAPOLEON INHUB 250-50 MCG/ACT AEPB Inhale  1 puff into the lungs in the morning and at bedtime. 04/20/23   [provider]  XARELTO  15 MG TABS tablet Take 15 mg by mouth daily. 03/08/24   [provider]     Objective    Physical Exam: Vitals:   04/05/24 2008 04/05/24 2030 04/05/24 2130 04/05/24 2237  BP: (!) 112/54 123/72 116/72 106/66  Pulse: (!) 104 (!) 103 (!) 102 (!) 105  Resp: 18  20 20   Temp: 98.3 F (36.8 C)   98.7 F (37.1 C)  TempSrc: Oral     SpO2: 100% 97% 100% 100%    General: appears to be stated age; alert Skin: warm, dry, no rash Head:  AT/[place] Mouth:  Oral mucosa membranes appear moist, normal dentition Neck:  supple; trachea midline Heart: Irregular; did not appreciate any M/R/G Lungs: CTAB, did not appreciate any wheezes, rales, or rhonchi Abdomen: + BS; soft, ND, NT Vascular: 2+ pedal pulses b/l; 2+ radial pulses b/l Extremities: no peripheral edema, no muscle wasting      Labs on Admission: I have personally reviewed following labs and imaging studies  CBC: Recent Labs  Lab 04/05/24 1343  WBC 11.1*  NEUTROABS 8.0*  HGB 10.4*  HCT 35.4*  MCV 74.2*  PLT 447*   Basic Metabolic Panel: Recent Labs  Lab 04/05/24 1343  NA 138  K 4.3  CL 103  CO2 20*  GLUCOSE 106*  BUN 33*  CREATININE 1.60*  CALCIUM  9.0   GFR: CrCl cannot be calculated (Unknown ideal weight.). Liver Function Tests: Recent Labs  Lab 04/05/24 1343  AST 31  ALT 24  ALKPHOS 108  BILITOT 0.4  PROT 7.6  ALBUMIN  4.0   No results for input(s): LIPASE, AMYLASE in the last 168 hours. No results for input(s): AMMONIA in the last 168 hours. Coagulation Profile: No results for input(s): INR, PROTIME in the last 168 hours. Cardiac Enzymes: No results for input(s): CKTOTAL, CKMB, CKMBINDEX, TROPONINI in the last 168 hours. BNP (last 3 results) Recent Labs    12/29/23 1232 02/25/24 1520 04/05/24 1343  PROBNP 3,395.0* 2,137.0* 2,191.0*   HbA1C: No results for input(s):  HGBA1C in the last 72 hours. CBG: No results for input(s): GLUCAP in the last 168 hours. Lipid Profile: No results for input(s): CHOL, HDL, LDLCALC, TRIG, CHOLHDL, LDLDIRECT in the last 72 hours. Thyroid Function Tests: No results for input(s): TSH, T4TOTAL, FREET4, T3FREE, THYROIDAB in the last 72 hours. Anemia Panel: No results for input(s): VITAMINB12, FOLATE, FERRITIN, TIBC, IRON, RETICCTPCT in the last 72 hours. Urine analysis:    Component Value Date/Time   COLORURINE YELLOW 04/05/2024 1319   APPEARANCEUR CLEAR 04/05/2024 1319   LABSPEC 1.010 04/05/2024 1319   PHURINE 5.5 04/05/2024 1319   GLUCOSEU >=500 (A) 04/05/2024 1319   HGBUR NEGATIVE 04/05/2024 1319   BILIRUBINUR NEGATIVE 04/05/2024 1319   KETONESUR NEGATIVE 04/05/2024 1319   PROTEINUR NEGATIVE 04/05/2024 1319   UROBILINOGEN 0.2 11/01/2014 1530   NITRITE NEGATIVE 04/05/2024 1319   LEUKOCYTESUR NEGATIVE 04/05/2024 1319    Radiological Exams on Admission: DG Chest 2 View Result Date: 04/05/2024 EXAM: 2 VIEW(S) XRAY OF THE CHEST 04/05/2024 01:34:31 PM COMPARISON: 03/05/2024 CLINICAL HISTORY: Chest heaviness and pain, SHOB, cough, chills for 1 week. Denies N/V. Tachypneic, appears SHOB after walking to triage. FINDINGS: LUNGS AND PLEURA: No focal pulmonary opacity. No pulmonary edema. No pleural effusion. No pneumothorax. HEART AND MEDIASTINUM: Left pacemaker in place. No acute abnormality of the cardiac and mediastinal silhouettes. BONES AND SOFT TISSUES: Lower cervical spine hardware noted. Stable lower thoracic spine vertebral body height loss compared to prior exam. No acute osseous abnormality. IMPRESSION: 1. No acute cardiopulmonary process. Electronically signed by: Waddell Calk MD 04/05/2024 03:07 PM EDT RP Workstation: [id number]      Assessment/Plan    Principal Problem:   Dyspnea Active Problems:   COPD with acute exacerbation (HCC)  ***        #) Acute  COPD exacerbation due to viral lower respiratory tract infection: In the setting of a documented history of COPD, the patient presents with evidence of acute COPD exacerbation in the form of 1 week of progressive shortness of breath, tachypnea, which appears to be on the basis of a viral lower respiratory tract infection given associated 1 week  of new onset productive cough as well as subjective fever and chills over that timeframe, with respiratory viral panel found to be positive for rhinovirus, while chest x-ray shows no evidence of acute cardiopulmonary process, including no evidence of infiltrate to suggest bacterial pneumonia.  No evidence of pneumothorax on chest x-ray.  No clinical, radiographic, or laboratory evidence to suggest acutely decompensated heart failure at this time, with chest x-ray showing no evidence of interstitial/pulmonary edema nor any evidence of pleural effusion, while his pro BNP appears to be at baseline, and significantly lower than proBNP documented in July 2025, as further quantified above.   Clinically, acute pulmonary embolism appears less likely, particular given the patient's good compliance with chronic anticoagulation on Xarelto .  ACS also appears less likely, in the absence of any recent typical chest pain, while troponin is 24 followed by 21, and EKG shows no evidence of acute ischemic changes, including no evidence of STEMI.   Given the finding of respiratory viral panel positive for rhinovirus, while chest x-ray shows no evidence of infiltrate and presentation does not appear to be associate with any additional source of underlying infection, will refrain from additional antibiotics at this time.  COVID, influenza, RSV PCR were noted to be negative.  He received dose of solumedrol [facility] earlier [facility].  Regarding management of his viral lower respiratory infection, will pursue supportive measures, as outlined below.  Plan:  Monitor on telemetry.   Continue solumedrol Solumedrol. Scheduled Xopenex /ipratropium nebulizers q6 hours. Prn Xopenex  nebulizer .  CMP in the morning. Repeat CBC in the morning. Check serum Mg and Phos levels. Will attempt additional chart review to evaluate most recent PFT results. Check blood gas.  Check procalcitonin.  Does have spirometry.  Flutter valve.  Scheduled Mucinex .  Prn Tessalon Perles.  Repeat proBNP in the morning.  Monitor strict I's and O's and daily weights.                     #) Type 2 Diabetes Mellitus: documented history of such. Home insulin  regimen: Tresiba  40 units SQ every morning as well as 46 units SQ nightly. Home oral hypoglycemic agents: Dapagliflozin. presenting blood sugar: 106. Most recent A1c noted to be 6.7% when checked in July 2025.  In terms of initial dosing of basal insulin  we will start conservatively to reduce risk for ensuing development of hypoglycemia, with adjustments to be made, as needed, on the basis of ensuing glycemic trend.  Plan: accuchecks QAC and HS with low dose SSI.  Insulin  glargine 10 units SQ twice daily, first dose now.  Hold home dapagliflozin for now.                       #) Paroxysmal atrial fibrillation: Documented history of such. In setting of CHA2DS2-VASc score of  ***, there is an indication for chronic anticoagulation for thromboembolic prophylaxis. Consistent with this, patient is chronically anticoagulated on  ***. Home AV nodal blocking regimen: ***.  Most recent echocardiogram  ***. Presenting EKG  ***.   Appears to be in normal sinus rhythm at this time *** *** it appears that a rhythm control strategy is also pursued as an outpatient, with the patient also on ***.    Plan: monitor strict I's & O's and daily weights. CMP/CBC in AM. Check serum mag level. Continue home AV nodal blocking regimen.  ***  ***anticoagulation.  ***  tele                     ***                    ***                   ***                    ***                    ***                    ***                    ***                    ***                    ***                   ***  DVT prophylaxis: SCD's ***  Code Status: Full code*** Family Communication: none*** Disposition Plan: Per Rounding Team Consults called: none***;  Admission status: ***    I SPENT GREATER THAN 75 *** MINUTES IN CLINICAL CARE TIME/MEDICAL DECISION-MAKING IN COMPLETING THIS ADMISSION.     Eva NOVAK Briyan Kleven DO [facility] From 7PM - 7AM   04/05/2024, 10:47 PM   ***

## 2024-04-05 NOTE — H&P (Signed)
 History and Physical      Mike Hunter FMW:[id number] DOB: 01/02/1951 DOA: 04/05/2024; DOS: 04/05/2024  PCP: Nikki Hansel Atlas, MD *** Patient coming from: home ***  I have personally briefly reviewed patient's old medical records in [facility]  Chief Complaint: ***  HPI: Mike Hunter is a 73 y.o. male with medical history significant for *** who is admitted to [facility] on 04/05/2024 with *** after presenting from home*** to [facility] ED complaining of ***.   ***        ***  ED Course:  Vital signs in the ED were notable for the following: ***  Labs were notable for the following: ***  Per my interpretation, EKG in ED demonstrated the following:  ***  Imaging in the ED, per corresponding formal radiology read, was notable for the following: ***  While in the ED, the following were administered: ***  Subsequently, the patient was admitted  ***  ***red   Review of Systems: As per HPI otherwise 10 point review of systems negative.   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   High cholesterol    History of methicillin resistant staphylococcus aureus (MRSA)    Hypertension    Reflux    Renal disorder    Sleep apnea     Past Surgical History:  Procedure Laterality Date   CARDIOVERSION N/A 08/26/2023   Procedure: CARDIOVERSION;  Surgeon: Barbaraann Darryle Ned, MD;  Location: [facility] INVASIVE CV LAB;  Service: Cardiovascular;  Laterality: N/A;   CARDIOVERSION N/A 02/26/2024   Procedure: CARDIOVERSION;  Surgeon: Lonni Slain, MD;  Location: [facility] INVASIVE CV LAB;  Service: Cardiovascular;  Laterality: N/A;   CARDIOVERSION N/A 03/04/2024   Procedure: CARDIOVERSION;  Surgeon: Francyne Headland, MD;  Location: [facility] INVASIVE CV LAB;   Service: Cardiovascular;  Laterality: N/A;   CATARACT EXTRACTION     CERVICAL FUSION     CHOLECYSTECTOMY     ICD IMPLANT N/A 03/01/2024   Procedure: ICD IMPLANT;  Surgeon: Kennyth Chew, MD;  Location: [facility] INVASIVE CV LAB;  Service: Cardiovascular;  Laterality: N/A;   KIDNEY STONE SURGERY     KNEE ARTHROSCOPY     TRANSESOPHAGEAL ECHOCARDIOGRAM (CATH LAB) N/A 03/04/2024   Procedure: TRANSESOPHAGEAL ECHOCARDIOGRAM;  Surgeon: Francyne Headland, MD;  Location: [facility] INVASIVE CV LAB;  Service: Cardiovascular;  Laterality: N/A;    Social History:  reports that he has quit smoking. He has never used smokeless tobacco. He reports that he does not drink alcohol and does not use drugs.   Allergies  Allergen Reactions   Buprenorphine Hcl Nausea And Vomiting    Other reaction(s): Nausea And Vomiting    Hydromorphone  Other (See Comments)    Other reaction(s): Mental Status Changes (intolerance) Other reaction(s): HALLUCINATIONS   Codeine Nausea And Vomiting   Mirtazapine     Other reaction(s): Other (See Comments) Nightmares & altered mental status    Morphine  And Codeine Nausea And Vomiting    History reviewed. No pertinent family history.  Family history reviewed and not pertinent ***   Prior to Admission medications   Medication Sig Start Date End Date Taking? Authorizing Provider  albuterol  (PROVENTIL  HFA;VENTOLIN  HFA) 108 (90 BASE) MCG/ACT  inhaler Inhale 2 puffs into the lungs every 4 (four) hours as needed for wheezing.    [provider]  amiodarone  (PACERONE ) 200 MG tablet Take 1 tablet (200 mg total) by mouth 2 (two) times daily. 03/09/24   Patsy Lenis, MD  atorvastatin  (LIPITOR ) 80 MG tablet Take 1 tablet (80 mg total) by mouth daily. 01/06/24 03/22/24  [name], [name], MD  azithromycin  (ZITHROMAX ) 250 MG tablet Take 250 mg by mouth 3 (three) times a week. 02/23/24   [provider]  budesonide  (PULMICORT ) 0.5 MG/2ML nebulizer solution Take 2 mLs by nebulization  2 (two) times daily.    [provider]  clopidogrel  (PLAVIX ) 75 MG tablet Take 75 mg by mouth daily. 05/18/23 05/17/24  [provider]  dapagliflozin propanediol (FARXIGA) 10 MG TABS tablet Take 10 mg by mouth daily.    [provider]  donepezil  (ARICEPT ) 10 MG tablet Take 10 mg by mouth at bedtime.    [provider]  insulin  aspart (NOVOLOG  FLEXPEN) 100 UNIT/ML injection Inject 0-13 Units into the skin 3 (three) times daily with meals.    [provider]  Insulin  Degludec (TRESIBA  FLEXTOUCH [place]) Inject 40-46 Units into the skin See admin instructions. 40 morning, 46 night    [provider]  iron polysaccharides (NIFEREX) 150 MG capsule Take 150 mg by mouth 2 (two) times daily. 02/25/24   [provider]  latanoprost  (XALATAN ) 0.005 % ophthalmic solution Place 1 drop into both eyes at bedtime.      [provider]  losartan  (COZAAR ) 25 MG tablet Take 1 tablet (25 mg total) by mouth daily. 03/09/24   Patsy Lenis, MD  memantine  (NAMENDA ) 10 MG tablet Take 10 mg by mouth 2 (two) times daily.    [provider]  metoprolol  succinate (TOPROL -XL) 50 MG 24 hr tablet Take 1 tablet (50 mg total) by mouth in the morning and at bedtime. Take with or immediately following a meal. 03/15/24   Almetta Donnice LABOR, MD  mirabegron  ER (MYRBETRIQ ) 50 MG TB24 tablet Take 50 mg by mouth daily. 02/09/24 02/03/25  [provider]  montelukast  (SINGULAIR ) 10 MG tablet Take 10 mg by mouth at bedtime.    [provider]  Multiple Vitamin (MULTIVITAMIN WITH MINERALS) TABS tablet Take 1 tablet by mouth daily.    [provider]  Multiple Vitamins-Minerals (PRESERVISION AREDS PO) Take 2 tablets by mouth daily.    [provider]  omeprazole (PRILOSEC) 40 MG capsule Take 1 capsule by mouth at bedtime. 02/02/17   [provider]  oxybutynin  (DITROPAN -XL) 10 MG 24 hr tablet Take 10 mg by mouth daily.     [provider]  sertraline  (ZOLOFT ) 100 MG tablet Take 100 mg by mouth daily.    [provider]  spironolactone  (ALDACTONE ) 25 MG tablet Take 25 mg by mouth daily.    [provider]  Suvorexant (BELSOMRA) 15 MG TABS Take 15 mg by mouth at bedtime.    [provider]  Teriparatide  560 MCG/2.24ML SOPN Inject 2.24 mLs into the skin at bedtime. 02/25/24   [provider]  torsemide  (DEMADEX ) 20 MG tablet Take 1 tablet (20 mg total) by mouth daily. 03/09/24   Patsy Lenis, MD  NAPOLEON INHUB 250-50 MCG/ACT AEPB Inhale 1 puff into the lungs in the morning and at bedtime. 04/20/23   [provider]  XARELTO  15 MG TABS tablet Take 15 mg by mouth daily. 03/08/24   [provider]  Objective    Physical Exam: Vitals:   04/05/24 2008 04/05/24 2030 04/05/24 2130 04/05/24 2237  BP: (!) 112/54 123/72 116/72 106/66  Pulse: (!) 104 (!) 103 (!) 102 (!) 105  Resp: 18  20 20   Temp: 98.3 F (36.8 C)   98.7 F (37.1 C)  TempSrc: Oral     SpO2: 100% 97% 100% 100%    General: appears to be stated age; alert, oriented Skin: warm, dry, no rash Head:  AT/[place] Mouth:  Oral mucosa membranes appear moist, normal dentition Neck: supple; trachea midline Heart:  RRR; did not appreciate any M/R/G Lungs: CTAB, did not appreciate any wheezes, rales, or rhonchi Abdomen: + BS; soft, ND, NT Vascular: 2+ pedal pulses b/l; 2+ radial pulses b/l Extremities: no peripheral edema, no muscle wasting   *** Neuro: strength and sensation intact in upper and lower extremities b/l  *** Neuro: 5/5 strength of the proximal and distal flexors and extensors of the upper and lower extremities bilaterally; sensation intact in upper and lower extremities b/l; cranial nerves II through XII grossly intact; no pronator drift; no evidence suggestive of slurred speech, dysarthria, or facial droop; Normal muscle tone. No tremors. *** Neuro: In the setting of the patient's  current mental status and associated inability to follow instructions, unable to perform full neurologic exam at this time.  As such, assessment of strength, sensation, and cranial nerves is limited at this time. Patient noted to spontaneously move all 4 extremities. No tremors.  ***       Labs on Admission: I have personally reviewed following labs and imaging studies  CBC: Recent Labs  Lab 04/05/24 1343  WBC 11.1*  NEUTROABS 8.0*  HGB 10.4*  HCT 35.4*  MCV 74.2*  PLT 447*   Basic Metabolic Panel: Recent Labs  Lab 04/05/24 1343  NA 138  K 4.3  CL 103  CO2 20*  GLUCOSE 106*  BUN 33*  CREATININE 1.60*  CALCIUM  9.0   GFR: CrCl cannot be calculated (Unknown ideal weight.). Liver Function Tests: Recent Labs  Lab 04/05/24 1343  AST 31  ALT 24  ALKPHOS 108  BILITOT 0.4  PROT 7.6  ALBUMIN  4.0   No results for input(s): LIPASE, AMYLASE in the last 168 hours. No results for input(s): AMMONIA in the last 168 hours. Coagulation Profile: No results for input(s): INR, PROTIME in the last 168 hours. Cardiac Enzymes: No results for input(s): CKTOTAL, CKMB, CKMBINDEX, TROPONINI in the last 168 hours. BNP (last 3 results) Recent Labs    12/29/23 1232 02/25/24 1520 04/05/24 1343  PROBNP 3,395.0* 2,137.0* 2,191.0*   HbA1C: No results for input(s): HGBA1C in the last 72 hours. CBG: No results for input(s): GLUCAP in the last 168 hours. Lipid Profile: No results for input(s): CHOL, HDL, LDLCALC, TRIG, CHOLHDL, LDLDIRECT in the last 72 hours. Thyroid Function Tests: No results for input(s): TSH, T4TOTAL, FREET4, T3FREE, THYROIDAB in the last 72 hours. Anemia Panel: No results for input(s): VITAMINB12, FOLATE, FERRITIN, TIBC, IRON, RETICCTPCT in the last 72 hours. Urine analysis:    Component Value Date/Time   COLORURINE YELLOW 04/05/2024 1319   APPEARANCEUR CLEAR 04/05/2024 1319   LABSPEC 1.010 04/05/2024  1319   PHURINE 5.5 04/05/2024 1319   GLUCOSEU >=500 (A) 04/05/2024 1319   HGBUR NEGATIVE 04/05/2024 1319   BILIRUBINUR NEGATIVE 04/05/2024 1319   KETONESUR NEGATIVE 04/05/2024 1319   PROTEINUR NEGATIVE 04/05/2024 1319   UROBILINOGEN 0.2 11/01/2014 1530   NITRITE NEGATIVE 04/05/2024 1319   LEUKOCYTESUR NEGATIVE 04/05/2024  1319    Radiological Exams on Admission: DG Chest 2 View Result Date: 04/05/2024 EXAM: 2 VIEW(S) XRAY OF THE CHEST 04/05/2024 01:34:31 PM COMPARISON: 03/05/2024 CLINICAL HISTORY: Chest heaviness and pain, SHOB, cough, chills for 1 week. Denies N/V. Tachypneic, appears SHOB after walking to triage. FINDINGS: LUNGS AND PLEURA: No focal pulmonary opacity. No pulmonary edema. No pleural effusion. No pneumothorax. HEART AND MEDIASTINUM: Left pacemaker in place. No acute abnormality of the cardiac and mediastinal silhouettes. BONES AND SOFT TISSUES: Lower cervical spine hardware noted. Stable lower thoracic spine vertebral body height loss compared to prior exam. No acute osseous abnormality. IMPRESSION: 1. No acute cardiopulmonary process. Electronically signed by: Waddell Calk MD 04/05/2024 03:07 PM EDT RP Workstation: [id number]      Assessment/Plan    Principal Problem:   Dyspnea Active Problems:   COPD with acute exacerbation (HCC)  ***        ***                  ***                  ***                    ***                    ***                   ***                    ***                    ***                    ***                    ***                    ***                    ***                   ***     DVT prophylaxis: SCD's ***  Code Status: Full code*** Family Communication: none*** Disposition Plan: Per  Rounding Team Consults called: none***;  Admission status: ***    I SPENT GREATER THAN 75 *** MINUTES IN CLINICAL CARE TIME/MEDICAL DECISION-MAKING IN COMPLETING THIS ADMISSION.     Mike NOVAK Shamel Germond DO [facility] From 7PM - 7AM   04/05/2024, 10:47 PM   ***

## 2024-04-05 NOTE — ED Provider Notes (Signed)
 [place] EMERGENCY DEPARTMENT AT [facility] Provider Note   CSN: [id number] Arrival date & time: 04/05/24  1308     Patient presents with: Chest Pain and Shortness of Breath   Mike Hunter is a 73 y.o. male.  {Add pertinent medical, surgical, social history, OB history to HPI:2284} 73 year old male history of CHF, diabetes, COPD (not on home oxygen), atrial fibrillation on Xarelto  who presents to the emergency department with URI type symptoms for 1 week.  Over the past week has had a cough productive of white sputum.  Also has had subjective fevers and chills.  Says that he is having diffuse bodyaches.  Also has had some shortness of breath.  Says that this feels similar to prior episodes of bronchitis or pneumonia.  Has chest heaviness but no chest pain otherwise.  Has been compliant with his Xarelto .       Prior to Admission medications   Medication Sig Start Date End Date Taking? Authorizing Provider  albuterol  (PROVENTIL  HFA;VENTOLIN  HFA) 108 (90 BASE) MCG/ACT inhaler Inhale 2 puffs into the lungs every 4 (four) hours as needed for wheezing.    [provider]  amiodarone  (PACERONE ) 200 MG tablet Take 1 tablet (200 mg total) by mouth 2 (two) times daily. 03/09/24   Patsy Alm, MD  atorvastatin  (LIPITOR ) 80 MG tablet Take 1 tablet (80 mg total) by mouth daily. 01/06/24 03/22/24  [name], [name], MD  azithromycin  (ZITHROMAX ) 250 MG tablet Take 250 mg by mouth 3 (three) times a week. 02/23/24   [provider]  budesonide  (PULMICORT ) 0.5 MG/2ML nebulizer solution Take 2 mLs by nebulization 2 (two) times daily.    [provider]  clopidogrel  (PLAVIX ) 75 MG tablet Take 75 mg by mouth daily. 05/18/23 05/17/24  [provider]  dapagliflozin propanediol (FARXIGA) 10 MG TABS tablet Take 10 mg by mouth daily.    [provider]  donepezil  (ARICEPT ) 10 MG tablet Take 10 mg by mouth at bedtime.    [provider]   insulin  aspart (NOVOLOG  FLEXPEN) 100 UNIT/ML injection Inject 0-13 Units into the skin 3 (three) times daily with meals.    [provider]  Insulin  Degludec (TRESIBA  FLEXTOUCH [place]) Inject 40-46 Units into the skin See admin instructions. 40 morning, 46 night    [provider]  iron polysaccharides (NIFEREX) 150 MG capsule Take 150 mg by mouth 2 (two) times daily. 02/25/24   [provider]  latanoprost  (XALATAN ) 0.005 % ophthalmic solution Place 1 drop into both eyes at bedtime.      [provider]  losartan  (COZAAR ) 25 MG tablet Take 1 tablet (25 mg total) by mouth daily. 03/09/24   Patsy Alm, MD  memantine  (NAMENDA ) 10 MG tablet Take 10 mg by mouth 2 (two) times daily.    [provider]  metoprolol  succinate (TOPROL -XL) 50 MG 24 hr tablet Take 1 tablet (50 mg total) by mouth in the morning and at bedtime. Take with or immediately following a meal. 03/15/24   Almetta Donnice LABOR, MD  mirabegron  ER (MYRBETRIQ ) 50 MG TB24 tablet Take 50 mg by mouth daily. 02/09/24 02/03/25  [provider]  montelukast  (SINGULAIR ) 10 MG tablet Take 10 mg by mouth at bedtime.    [provider]  Multiple Vitamin (MULTIVITAMIN WITH MINERALS) TABS tablet Take 1 tablet by mouth daily.    [provider]  Multiple Vitamins-Minerals (PRESERVISION AREDS PO) Take 2 tablets by mouth daily.    [provider]  omeprazole (PRILOSEC) 40 MG capsule Take 1 capsule by mouth at bedtime. 02/02/17   [provider]  oxybutynin  (DITROPAN -XL) 10 MG 24 hr tablet Take 10 mg by mouth daily.    [provider]  sertraline  (ZOLOFT ) 100 MG tablet Take 100 mg by mouth daily.    [provider]  spironolactone  (ALDACTONE ) 25 MG tablet Take 25 mg by mouth daily.    [provider]  Suvorexant (BELSOMRA) 15 MG TABS Take 15 mg by mouth at bedtime.    [provider]  Teriparatide  560 MCG/2.24ML SOPN Inject 2.24 mLs into  the skin at bedtime. 02/25/24   [provider]  torsemide  (DEMADEX ) 20 MG tablet Take 1 tablet (20 mg total) by mouth daily. 03/09/24   Patsy Lenis, MD  NAPOLEON INHUB 250-50 MCG/ACT AEPB Inhale 1 puff into the lungs in the morning and at bedtime. 04/20/23   [provider]  XARELTO  15 MG TABS tablet Take 15 mg by mouth daily. 03/08/24   [provider]    Allergies: Buprenorphine hcl, Hydromorphone , Codeine, Mirtazapine, and Morphine  and codeine    Review of Systems  Updated Vital Signs BP 116/67 (BP Location: Right Arm)   Pulse (!) 104   Temp 98.8 F (37.1 C)   Resp (!) 22   SpO2 99%   Physical Exam Vitals and nursing note reviewed.  Constitutional:      General: He is not in acute distress.    Appearance: He is well-developed.  HENT:     Head: Normocephalic and atraumatic.     Right Ear: External ear normal.     Left Ear: External ear normal.     Nose: Nose normal.  Eyes:     Extraocular Movements: Extraocular movements intact.     Conjunctiva/sclera: Conjunctivae normal.     Pupils: Pupils are equal, round, and reactive to light.  Cardiovascular:     Rate and Rhythm: Regular rhythm. Tachycardia present.     Heart sounds: Normal heart sounds.  Pulmonary:     Effort: Pulmonary effort is normal. No respiratory distress.     Breath sounds: Wheezing (Expiratory) present.  Musculoskeletal:     Cervical back: Normal range of motion and neck supple.     Right lower leg: No edema.     Left lower leg: No edema.  Skin:    General: Skin is warm and dry.  Neurological:     Mental Status: He is alert. Mental status is at baseline.  Psychiatric:        Mood and Affect: Mood normal.        Behavior: Behavior normal.     (all labs ordered are listed, but only abnormal results are displayed) Labs Reviewed  RESP PANEL BY RT-PCR (RSV, FLU A&B, COVID)  RVPGX2  CULTURE, BLOOD (ROUTINE X 2)  CULTURE, BLOOD (ROUTINE X 2)  LACTIC ACID, PLASMA  LACTIC  ACID, PLASMA  COMPREHENSIVE METABOLIC PANEL WITH GFR  CBC WITH DIFFERENTIAL/PLATELET  URINALYSIS, W/ REFLEX TO CULTURE (INFECTION SUSPECTED)  TROPONIN T, HIGH SENSITIVITY    EKG: EKG Interpretation Date/Time:  Tuesday April 05 2024 13:17:51 EDT Ventricular Rate:  100 PR Interval:    QRS Duration:  106 QT Interval:  384 QTC Calculation: 496 R Axis:   103  Text Interpretation: 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  {Document cardiac monitor, telemetry assessment  procedure when appropriate:32947} Procedures   Medications Ordered in the ED  ipratropium-albuterol  (DUONEB) 0.5-2.5 (3) MG/3ML nebulizer solution 6 mL (has no administration in time range)  methylPREDNISolone  sodium succinate (SOLU-MEDROL ) 125 mg/2 mL injection 125 mg (has no administration in time range)  cefTRIAXone  (ROCEPHIN ) 2 g in sodium chloride  0.9 % 100 mL IVPB (has no administration in time range)  azithromycin  (ZITHROMAX ) tablet 500 mg (has no administration in time range)      {Click here for ABCD2, HEART and other calculators REFRESH Note before signing:1}                              Medical Decision Making Amount and/or Complexity of Data Reviewed Labs: ordered. Radiology: ordered.  Risk Prescription drug management.   ***  {Document critical care time when appropriate  Document review of labs and clinical decision tools ie CHADS2VASC2, etc  Document your independent review of radiology images and any outside records  Document your discussion with family members, caretakers and with consultants  Document social determinants of health affecting pt's care  Document your decision making why or why not admission, treatments were needed:32947:::1}   Final diagnoses:  None    ED Discharge Orders     None

## 2024-04-06 DIAGNOSIS — F0393 Unspecified dementia, unspecified severity, with mood disturbance: Secondary | ICD-10-CM | POA: Diagnosis present

## 2024-04-06 DIAGNOSIS — G4733 Obstructive sleep apnea (adult) (pediatric): Secondary | ICD-10-CM | POA: Diagnosis present

## 2024-04-06 DIAGNOSIS — B348 Other viral infections of unspecified site: Secondary | ICD-10-CM | POA: Diagnosis present

## 2024-04-06 DIAGNOSIS — E66812 Obesity, class 2: Secondary | ICD-10-CM | POA: Diagnosis present

## 2024-04-06 DIAGNOSIS — E1122 Type 2 diabetes mellitus with diabetic chronic kidney disease: Secondary | ICD-10-CM | POA: Diagnosis present

## 2024-04-06 DIAGNOSIS — I130 Hypertensive heart and chronic kidney disease with heart failure and stage 1 through stage 4 chronic kidney disease, or unspecified chronic kidney disease: Secondary | ICD-10-CM | POA: Diagnosis present

## 2024-04-06 DIAGNOSIS — H409 Unspecified glaucoma: Secondary | ICD-10-CM | POA: Diagnosis present

## 2024-04-06 DIAGNOSIS — I480 Paroxysmal atrial fibrillation: Secondary | ICD-10-CM | POA: Diagnosis present

## 2024-04-06 DIAGNOSIS — M810 Age-related osteoporosis without current pathological fracture: Secondary | ICD-10-CM | POA: Diagnosis present

## 2024-04-06 DIAGNOSIS — B9789 Other viral agents as the cause of diseases classified elsewhere: Secondary | ICD-10-CM | POA: Diagnosis present

## 2024-04-06 DIAGNOSIS — R000 Tachycardia, unspecified: Secondary | ICD-10-CM | POA: Diagnosis present

## 2024-04-06 DIAGNOSIS — T380X5A Adverse effect of glucocorticoids and synthetic analogues, initial encounter: Secondary | ICD-10-CM | POA: Diagnosis present

## 2024-04-06 DIAGNOSIS — I5022 Chronic systolic (congestive) heart failure: Secondary | ICD-10-CM | POA: Diagnosis present

## 2024-04-06 DIAGNOSIS — F319 Bipolar disorder, unspecified: Secondary | ICD-10-CM | POA: Diagnosis present

## 2024-04-06 DIAGNOSIS — Z794 Long term (current) use of insulin: Secondary | ICD-10-CM | POA: Diagnosis not present

## 2024-04-06 DIAGNOSIS — Z87891 Personal history of nicotine dependence: Secondary | ICD-10-CM | POA: Diagnosis not present

## 2024-04-06 DIAGNOSIS — Z7951 Long term (current) use of inhaled steroids: Secondary | ICD-10-CM | POA: Diagnosis not present

## 2024-04-06 DIAGNOSIS — E1165 Type 2 diabetes mellitus with hyperglycemia: Secondary | ICD-10-CM | POA: Diagnosis present

## 2024-04-06 DIAGNOSIS — E785 Hyperlipidemia, unspecified: Secondary | ICD-10-CM | POA: Diagnosis present

## 2024-04-06 DIAGNOSIS — F03918 Unspecified dementia, unspecified severity, with other behavioral disturbance: Secondary | ICD-10-CM | POA: Diagnosis present

## 2024-04-06 DIAGNOSIS — Z7901 Long term (current) use of anticoagulants: Secondary | ICD-10-CM | POA: Diagnosis not present

## 2024-04-06 DIAGNOSIS — D509 Iron deficiency anemia, unspecified: Secondary | ICD-10-CM | POA: Diagnosis present

## 2024-04-06 DIAGNOSIS — N1832 Chronic kidney disease, stage 3b: Secondary | ICD-10-CM | POA: Diagnosis present

## 2024-04-06 DIAGNOSIS — J441 Chronic obstructive pulmonary disease with (acute) exacerbation: Secondary | ICD-10-CM | POA: Diagnosis present

## 2024-04-06 DIAGNOSIS — Z1152 Encounter for screening for COVID-19: Secondary | ICD-10-CM | POA: Diagnosis not present

## 2024-04-06 DIAGNOSIS — J069 Acute upper respiratory infection, unspecified: Secondary | ICD-10-CM | POA: Diagnosis present

## 2024-04-06 DIAGNOSIS — E7800 Pure hypercholesterolemia, unspecified: Secondary | ICD-10-CM | POA: Diagnosis present

## 2024-04-06 LAB — CBC WITH DIFFERENTIAL/PLATELET
Abs Immature Granulocytes: 0.15 K/uL — ABNORMAL HIGH (ref 0.00–0.07)
Basophils Absolute: 0 K/uL (ref 0.0–0.1)
Basophils Relative: 0 %
Eosinophils Absolute: 0 K/uL (ref 0.0–0.5)
Eosinophils Relative: 0 %
HCT: 36 % — ABNORMAL LOW (ref 39.0–52.0)
Hemoglobin: 10.1 g/dL — ABNORMAL LOW (ref 13.0–17.0)
Immature Granulocytes: 2 %
Lymphocytes Relative: 8 %
Lymphs Abs: 0.8 K/uL (ref 0.7–4.0)
MCH: 21.4 pg — ABNORMAL LOW (ref 26.0–34.0)
MCHC: 28.1 g/dL — ABNORMAL LOW (ref 30.0–36.0)
MCV: 76.1 fL — ABNORMAL LOW (ref 80.0–100.0)
Monocytes Absolute: 0.5 K/uL (ref 0.1–1.0)
Monocytes Relative: 5 %
Neutro Abs: 8.8 K/uL — ABNORMAL HIGH (ref 1.7–7.7)
Neutrophils Relative %: 85 %
Platelets: 395 K/uL (ref 150–400)
RBC: 4.73 MIL/uL (ref 4.22–5.81)
RDW: 18.3 % — ABNORMAL HIGH (ref 11.5–15.5)
WBC: 10.2 K/uL (ref 4.0–10.5)
nRBC: 0 % (ref 0.0–0.2)

## 2024-04-06 LAB — BLOOD GAS, VENOUS
Acid-Base Excess: 0.8 mmol/L (ref 0.0–2.0)
Bicarbonate: 26 mmol/L (ref 20.0–28.0)
Drawn by: 8473
O2 Saturation: 69 %
Patient temperature: 36.1
pCO2, Ven: 41 mmHg — ABNORMAL LOW (ref 44–60)
pH, Ven: 7.4 (ref 7.25–7.43)
pO2, Ven: 39 mmHg (ref 32–45)

## 2024-04-06 LAB — COMPREHENSIVE METABOLIC PANEL WITH GFR
ALT: 26 U/L (ref 0–44)
AST: 20 U/L (ref 15–41)
Albumin: 3.9 g/dL (ref 3.5–5.0)
Alkaline Phosphatase: 108 U/L (ref 38–126)
Anion gap: 13 (ref 5–15)
BUN: 37 mg/dL — ABNORMAL HIGH (ref 8–23)
CO2: 21 mmol/L — ABNORMAL LOW (ref 22–32)
Calcium: 9.2 mg/dL (ref 8.9–10.3)
Chloride: 107 mmol/L (ref 98–111)
Creatinine, Ser: 1.46 mg/dL — ABNORMAL HIGH (ref 0.61–1.24)
GFR, Estimated: 50 mL/min — ABNORMAL LOW (ref >60)
Glucose, Bld: 196 mg/dL — ABNORMAL HIGH (ref 70–99)
Potassium: 4.5 mmol/L (ref 3.5–5.1)
Sodium: 141 mmol/L (ref 135–145)
Total Bilirubin: 0.4 mg/dL (ref 0.0–1.2)
Total Protein: 7.4 g/dL (ref 6.5–8.1)

## 2024-04-06 LAB — GLUCOSE, CAPILLARY
Glucose-Capillary: 197 mg/dL — ABNORMAL HIGH (ref 70–99)
Glucose-Capillary: 221 mg/dL — ABNORMAL HIGH (ref 70–99)
Glucose-Capillary: 365 mg/dL — ABNORMAL HIGH (ref 70–99)
Glucose-Capillary: 323 mg/dL — ABNORMAL HIGH (ref 70–99)

## 2024-04-06 LAB — PRO BRAIN NATRIURETIC PEPTIDE: Pro Brain Natriuretic Peptide: 2599 pg/mL — ABNORMAL HIGH (ref <300.0)

## 2024-04-06 LAB — PHOSPHORUS: Phosphorus: 4.1 mg/dL (ref 2.5–4.6)

## 2024-04-06 LAB — PROCALCITONIN: Procalcitonin: 0.25 ng/mL

## 2024-04-06 LAB — MAGNESIUM: Magnesium: 2.9 mg/dL — ABNORMAL HIGH (ref 1.7–2.4)

## 2024-04-06 MED ORDER — LEVALBUTEROL HCL 1.25 MG/0.5ML IN NEBU
1.2500 mg | INHALATION_SOLUTION | Freq: Four times a day (QID) | RESPIRATORY_TRACT | Status: DC
  Administered 2024-04-06 (×2): 1.25 mg via RESPIRATORY_TRACT
  Filled 2024-04-06 (×5): qty 0.5

## 2024-04-06 MED ORDER — INSULIN GLARGINE-YFGN 100 UNIT/ML ~~LOC~~ SOLN
10.0000 [IU] | Freq: Two times a day (BID) | SUBCUTANEOUS | Status: DC
  Administered 2024-04-06 – 2024-04-07 (×3): 10 [IU] via SUBCUTANEOUS
  Filled 2024-04-06 (×4): qty 0.1

## 2024-04-06 MED ORDER — IPRATROPIUM BROMIDE 0.02 % IN SOLN
0.5000 mg | Freq: Three times a day (TID) | RESPIRATORY_TRACT | Status: DC
  Administered 2024-04-07 (×2): 0.5 mg via RESPIRATORY_TRACT
  Filled 2024-04-06 (×2): qty 2.5

## 2024-04-06 MED ORDER — LEVALBUTEROL HCL 1.25 MG/0.5ML IN NEBU
1.2500 mg | INHALATION_SOLUTION | Freq: Three times a day (TID) | RESPIRATORY_TRACT | Status: DC
  Administered 2024-04-07 (×2): 1.25 mg via RESPIRATORY_TRACT
  Filled 2024-04-06 (×3): qty 0.5

## 2024-04-06 MED ORDER — RIVAROXABAN 20 MG PO TABS
20.0000 mg | ORAL_TABLET | Freq: Every day | ORAL | Status: DC
Start: 2024-04-07 — End: 2024-04-07
  Administered 2024-04-07: 20 mg via ORAL
  Filled 2024-04-06: qty 1

## 2024-04-06 NOTE — Progress Notes (Signed)
 Mobility Specialist - Progress Note   04/06/24 1341  Mobility  Activity Ambulated with assistance  Level of Assistance Contact guard assist, steadying assist  Assistive Device Front wheel walker  Distance Ambulated (ft) 15 ft  Range of Motion/Exercises Active  Activity Response Tolerated poorly  Mobility Referral Yes  Mobility visit 1 Mobility  Mobility Specialist Start Time (ACUTE ONLY) 1301  Mobility Specialist Stop Time (ACUTE ONLY) 1314  Mobility Specialist Time Calculation (min) (ACUTE ONLY) 13 min   Received in bed and agreed to mobility.  On room air, pt SpO2% was 94%, after ambulating the short bout due to pain and wheezing, saturations were at 93%. Returned to bed with all needs met.   Cyndee Ada Mobility Specialist

## 2024-04-06 NOTE — Progress Notes (Signed)
 Progress Note   Patient: Mike Hunter FMW:[id number] DOB: 1951/06/13 DOA: 04/05/2024     0 DOS: the patient was seen and examined on 04/06/2024   Brief hospital course: 73yo with h/o DM, COPD, afib on Xarelto , chronic systolic CHF, stage 3b CKD, and chronic anemia who presented on 10/21 with SOB.  He was found to have a COPD exacerbation from rhinovirus infection.  Assessment and Plan:  Acute COPD exacerbation due to rhinovirus infection 1 week of progressive shortness of breath, tachypnea, productive cough  Respiratory viral panel found to be positive for rhinovirus CXR negative COVID, influenza, RSV negative Antibiotics are not indicated  Monitor on telemetry Continue Solumedrol -> prednisone  at time of dc  Scheduled Xopenex /ipratropium nebulizers q6 hours with prn Xopenex  nebulizer  IS, Flutter valve Scheduled Mucinex , prn Tessalon Perles Take azithromycin  three times a week On budesonide  nebs, Singulair , [place] at home   Type 2 Diabetes Mellitus A1c 6.7, good control Continue glargine (decreased dose) Hold [employer] with sensitive-scale SSI Carb modified diet   Paroxysmal atrial fibrillation Continue amiodarone , Toprol  XL for rate control Continue Xarelto   Chronic systolic heart failure Last echocardiogram was 9/19 with LVEF 35 to 40% Appears compensated Continue spironolactone  Continue Toprol  XL   CKD Stage 3B Baseline creatinine 1.3-1.8 Appears to be stable at this time Attempt to avoid nephrotoxic medications Recheck BMP in AM    Hyperlipidemia Continue atorvastatin    Chronic iron deficiency anemia Baseline hgb range 9-11 Appears to be stable Continue Niferex  Incontinence Continue mirabegron   Depression Continue sertraline   HTN Marginal BP on presentation Continue Toprol  XL Hold losartan  for now  Dementia Continue donepezil , memantine   OSA Continue CPAP  Glaucoma Continue latanoprost   Osteoporosis Hold teriparatide  while  hospitalized  Class 2 obesity Body mass index is 38.43 kg/m.SABRA  Weight loss should be encouraged Outpatient PCP/bariatric medicine f/u encouraged Significantly low or high BMI is associated with higher medical risk including morbidity and mortality     Consultants: None  Procedures: None  Antibiotics: Ceftriaxone  x 1 Azithromycin  x 1  30 Day Unplanned Readmission Risk Score    Flowsheet Row ED to Hosp-Admission (Discharged) from 02/25/2024 in [facility] 3E HF PCU  30 Day Unplanned Readmission Risk Score (%) 39.06 Filed at 03/09/2024 1200    This score is the patient's risk of an unplanned readmission within 30 days of being discharged (0 -100%). The score is based on dignosis, age, lab data, medications, orders, and past utilization.   Low:  0-14.9   Medium: 15-21.9   High: 22-29.9   Extreme: 30 and above           Subjective: Feeling ok, still with cough, on room air.  Thinks he needs another day of hospitalization.   Objective: Vitals:   04/06/24 1140 04/06/24 1421  BP: 135/76   Pulse: (!) 109 (!) 110  Resp: 19   Temp: 98 F (36.7 C)   SpO2: 97% 97%    Intake/Output Summary (Last 24 hours) at 04/06/2024 1747 Last data filed at 04/06/2024 1719 Gross per 24 hour  Intake 974 ml  Output 2225 ml  Net -1251 ml   Filed Weights   04/05/24 2237  Weight: 121.5 kg    Exam:  General:  Appears calm and comfortable and is in NAD, very conversant without dyspnea Eyes:  normal lids, iris ENT:  grossly normal hearing, lips & tongue, mmm Cardiovascular:  RR with mild tachycardia. No LE edema.  Respiratory:   +expiratory wheezing,  Normal  respiratory effort on RA.  + staccato cough. Abdomen:  soft, NT, ND Skin:  no rash or induration seen on limited exam Musculoskeletal:  grossly normal tone BUE/BLE, good ROM, no bony abnormality Psychiatric:  grossly normal mood and affect, speech fluent and appropriate, AOx3 Neurologic:  CN 2-12 grossly intact, moves  all extremities in coordinated fashion  Data Reviewed: I have reviewed the patient's lab results since admission.  Pertinent labs for today include:   Glucose 196 BUN 37/Creatinine 1.46/GFR 50 Lactate 1.5 WBC 10.2 Hgb 10.1 RVP + rhinovirus     Family Communication: None present      Code Status: Full Code   Disposition: Status is: Inpatient Admit - It is my clinical opinion that admission to INPATIENT is reasonable and necessary because of the expectation that this patient will require hospital care that crosses at least 2 midnights to treat this condition based on the medical complexity of the problems presented.  Given the aforementioned information, the predictability of an adverse outcome is felt to be significant.      Time spent: 50 minutes  Unresulted Labs (From admission, onward)     Start     Ordered   04/07/24 0500  CBC with Differential/Platelet  Tomorrow morning,   R        04/06/24 1747   04/07/24 0500  Basic metabolic panel with GFR  Tomorrow morning,   R        04/06/24 1747             Author: Delon Herald, MD 04/06/2024 5:47 PM  For on call review [email or web address].

## 2024-04-06 NOTE — Progress Notes (Signed)
   04/06/24 1009  BiPAP/CPAP/SIPAP  BiPAP/CPAP/SIPAP Pt Type Adult  BiPAP/CPAP/SIPAP Resmed  Mask Type Full face mask  Dentures removed? Not applicable (pt states no dentures)  Mask Size Medium (pt states he uses FFM at home)  Respiratory Rate 18 breaths/min  IPAP 10 cmH20 (pp home setting 10cm)  FiO2 (%) (S)  36 % (4 LPM bleed in)  Patient Home Machine No  Patient Home Mask No  Patient Home Tubing No  Auto Titrate No  Nasal massage performed Yes  CPAP/SIPAP surface wiped down Yes  Device Plugged into RED Power Outlet Yes  BiPAP/CPAP /SiPAP Vitals  Pulse Rate 100  Resp 18  SpO2 98 %  Bilateral Breath Sounds Expiratory wheezes;Diminished  MEWS Score/Color  MEWS Score 0  MEWS Score Color Landy

## 2024-04-06 NOTE — Plan of Care (Signed)

## 2024-04-06 NOTE — Progress Notes (Signed)
 1009- RT placed PT on CPAP for napping (10cm w/ 4 lpm bleed in - home settings)- found PT off CPAP at approximately 1412 on RA - Sp02 94%, PT awake and alert.

## 2024-04-06 NOTE — Plan of Care (Signed)
  Problem: Health Behavior/Discharge Planning: Goal: Ability to manage health-related needs will improve Outcome: Progressing   Problem: Clinical Measurements: Goal: Respiratory complications will improve Outcome: Progressing   Problem: Activity: Goal: Risk for activity intolerance will decrease Outcome: Progressing   

## 2024-04-06 NOTE — Progress Notes (Signed)
   04/06/24 1421  BiPAP/CPAP/SIPAP  BiPAP/CPAP/SIPAP Pt Type Adult  BiPAP/CPAP/SIPAP Resmed  Mask Type Full face mask  Dentures removed? Not applicable (pp no dentures)  Mask Size Medium  Respiratory Rate 18 breaths/min  IPAP 10 cmH20 (pp home setting)  FiO2 (%) (S)  32 % (pp- 3 lpm bleed in at home)  Patient Home Machine No  Patient Home Mask No  Patient Home Tubing No  Auto Titrate No  Device Plugged into RED Power Outlet Yes  BiPAP/CPAP /SiPAP Vitals  Pulse Rate (!) 110 (afib- varying)  SpO2 97 %  Bilateral Breath Sounds Expiratory wheezes;Diminished  MEWS Score/Color  MEWS Score 1  MEWS Score Color Green

## 2024-04-06 NOTE — Progress Notes (Signed)
 Chaplain responded to spiritual care consult. Pt Mike Hunter welcomed me into the room and shared his thoughts and emotions regarding his health situation and this current hospitalization. 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 We explored his theology/spirituality, which serves as his primary source of hope.  I provided spiritual/emotional support through compassionate presence, healing listening, and prayer/blessing of his food at his request.  Mike Hunter is aware chaplains remain available whenever needed.

## 2024-04-06 NOTE — Progress Notes (Signed)
   04/06/24 2130  BiPAP/CPAP/SIPAP  BiPAP/CPAP/SIPAP Pt Type Adult  BiPAP/CPAP/SIPAP Resmed  Mask Type Full face mask  Dentures removed? Not applicable  Mask Size Medium  EPAP 10 cmH2O  Flow Rate 3 lpm  Patient Home Machine No  Patient Home Mask No  Patient Home Tubing No  Auto Titrate No  Device Plugged into RED Power Outlet Yes

## 2024-04-06 NOTE — Progress Notes (Signed)
 Been dealing with a variety of medical issues He has had cardiac problems in particular He had a ICD implant recently, There might be some concerns with him being on this low-dose of doxepin  a tricyclic antidepressant which was some of the other medicines but I think with the ICD implant but this concern would be negligible  Continue with the low-dose doxepin  as tolerated

## 2024-04-06 NOTE — Telephone Encounter (Signed)
 Pt scheduled for ablation on 05/16/2024 per Dr Garnette request.

## 2024-04-06 NOTE — Hospital Course (Signed)
 73yo with h/o DM, COPD, afib on Xarelto , chronic systolic CHF, stage 3b CKD, and chronic anemia who presented on 10/21 with SOB.  He was found to have a COPD exacerbation from rhinovirus infection.

## 2024-04-07 DIAGNOSIS — F319 Bipolar disorder, unspecified: Secondary | ICD-10-CM | POA: Diagnosis present

## 2024-04-07 DIAGNOSIS — F03918 Unspecified dementia, unspecified severity, with other behavioral disturbance: Secondary | ICD-10-CM | POA: Diagnosis present

## 2024-04-07 DIAGNOSIS — J441 Chronic obstructive pulmonary disease with (acute) exacerbation: Secondary | ICD-10-CM | POA: Diagnosis not present

## 2024-04-07 LAB — BASIC METABOLIC PANEL WITH GFR
Anion gap: 15 (ref 5–15)
BUN: 51 mg/dL — ABNORMAL HIGH (ref 8–23)
CO2: 21 mmol/L — ABNORMAL LOW (ref 22–32)
Calcium: 9.2 mg/dL (ref 8.9–10.3)
Chloride: 103 mmol/L (ref 98–111)
Creatinine, Ser: 1.68 mg/dL — ABNORMAL HIGH (ref 0.61–1.24)
GFR, Estimated: 42 mL/min — ABNORMAL LOW (ref >60)
Glucose, Bld: 396 mg/dL — ABNORMAL HIGH (ref 70–99)
Potassium: 4.5 mmol/L (ref 3.5–5.1)
Sodium: 139 mmol/L (ref 135–145)

## 2024-04-07 LAB — CBC WITH DIFFERENTIAL/PLATELET
Abs Immature Granulocytes: 0.13 K/uL — ABNORMAL HIGH (ref 0.00–0.07)
Basophils Absolute: 0 K/uL (ref 0.0–0.1)
Basophils Relative: 0 %
Eosinophils Absolute: 0 K/uL (ref 0.0–0.5)
Eosinophils Relative: 0 %
HCT: 35.8 % — ABNORMAL LOW (ref 39.0–52.0)
Hemoglobin: 9.9 g/dL — ABNORMAL LOW (ref 13.0–17.0)
Immature Granulocytes: 1 %
Lymphocytes Relative: 5 %
Lymphs Abs: 0.7 K/uL (ref 0.7–4.0)
MCH: 21.1 pg — ABNORMAL LOW (ref 26.0–34.0)
MCHC: 27.7 g/dL — ABNORMAL LOW (ref 30.0–36.0)
MCV: 76.3 fL — ABNORMAL LOW (ref 80.0–100.0)
Monocytes Absolute: 0.2 K/uL (ref 0.1–1.0)
Monocytes Relative: 2 %
Neutro Abs: 13.7 K/uL — ABNORMAL HIGH (ref 1.7–7.7)
Neutrophils Relative %: 92 %
Platelets: 428 K/uL — ABNORMAL HIGH (ref 150–400)
RBC: 4.69 MIL/uL (ref 4.22–5.81)
RDW: 18.6 % — ABNORMAL HIGH (ref 11.5–15.5)
WBC: 14.8 K/uL — ABNORMAL HIGH (ref 4.0–10.5)
nRBC: 0 % (ref 0.0–0.2)

## 2024-04-07 LAB — GLUCOSE, CAPILLARY
Glucose-Capillary: 401 mg/dL — ABNORMAL HIGH (ref 70–99)
Glucose-Capillary: 363 mg/dL — ABNORMAL HIGH (ref 70–99)

## 2024-04-07 LAB — GLUCOSE, RANDOM: Glucose, Bld: 409 mg/dL — ABNORMAL HIGH (ref 70–99)

## 2024-04-07 MED ORDER — BENZONATATE 200 MG PO CAPS: 200.0000 mg | ORAL_CAPSULE | Freq: Three times a day (TID) | ORAL | 0 refills | Status: DC | PRN

## 2024-04-07 MED ORDER — PREDNISONE 20 MG PO TABS: 20.0000 mg | ORAL_TABLET | Freq: Every day | ORAL | 0 refills | Status: AC

## 2024-04-07 MED ORDER — GUAIFENESIN ER 600 MG PO TB12: 600.0000 mg | ORAL_TABLET | Freq: Two times a day (BID) | ORAL | 0 refills | Status: DC

## 2024-04-07 NOTE — Plan of Care (Signed)
  Problem: Education: Goal: Knowledge of General Education information will improve Description: Including pain rating scale, medication(s)/side effects and non-pharmacologic comfort measures Outcome: Progressing   Problem: Health Behavior/Discharge Planning: Goal: Ability to manage health-related needs will improve Outcome: Progressing   Problem: Clinical Measurements: Goal: Ability to maintain clinical measurements within normal limits will improve Outcome: Progressing Goal: Will remain free from infection Outcome: Progressing Goal: Diagnostic test results will improve Outcome: Progressing Goal: Respiratory complications will improve Outcome: Progressing Goal: Cardiovascular complication will be avoided Outcome: Progressing   Problem: Activity: Goal: Risk for activity intolerance will decrease Outcome: Progressing   Problem: Nutrition: Goal: Adequate nutrition will be maintained Outcome: Progressing   Problem: Education: Goal: Ability to describe self-care measures that may prevent or decrease complications (Diabetes Survival Skills Education) will improve Outcome: Progressing   Problem: Metabolic: Goal: Ability to maintain appropriate glucose levels will improve Outcome: Progressing

## 2024-04-07 NOTE — Evaluation (Signed)
 Occupational Therapy Evaluation Patient Details Name: Mike Hunter MRN: [id number] DOB: Jul 20, 1950 Today's Date: 04/07/2024   History of Present Illness   Mike Hunter is a 73 y.o. male with medical history significant for type 2 diabetes mellitus, COPD, paroxysmal atrial fibrillation chronically anticoagulated on Xarelto , chronic systolic heart failure, CKD 3B with baseline creatinine 1.3-1.8, chronic iron deficiency anemia with baseline hemoglobin 9-11, who is admitted to [facility] on 04/05/2024 by way of transfer from [facility] with acute COPD exacerbation in the setting of viral lower respiratory tract infection after presenting from home to the latter facility complaining of shortness of breath.     Clinical Impressions Pt was seen for OT evaluation this date. Prior to hospital admission, pt was MODI with BADL, requiring supervision for seated bathing tasks. Pt endorses he uses a RW at all times, was receiving HH therapy services PTA. Pt lives with his wife in one level home with 3 steps to enter. Pt reports his daughter is a CGA and SIL is extremely supportive and able to provided physical assistance if need be. Pt presents with deficits in decreased Ind in self care, balance, functional mobility/transfers and activity tolerance affecting safe and optimal ADL completion. Pt currently requires CGA for STS from slightly elevated bed height, minimal verbal cues for technique and DME management. Pt ambulates within room ~26ft with use of RW +CGA, ending in the recliner post standing bathing task. Pt performed LB dressing via figure 4 method. Pt would benefit from skilled OT services to address noted impairments and functional limitations (see below for any additional details) in order to maximize safety and independence while minimizing future risk of falls, injury, and readmission. At discharge pt would benefit from skill HHOT to aid in returning to functional baseline.      If plan is discharge home, recommend the following:   A little help with walking and/or transfers;A little help with bathing/dressing/bathroom;Assistance with cooking/housework;Assist for transportation     Functional Status Assessment   Patient has had a recent decline in their functional status and demonstrates the ability to make significant improvements in function in a reasonable and predictable amount of time.     Equipment Recommendations   None recommended by OT      Precautions/Restrictions   Precautions Precautions: Fall Recall of Precautions/Restrictions: Intact Restrictions Weight Bearing Restrictions Per Provider Order: No     Mobility Bed Mobility Overal bed mobility: Modified Independent             General bed mobility comments: No physical assistance to complete, utilized bed rails for supine<>sit    Transfers Overall transfer level: Needs assistance Equipment used: Rolling walker (2 wheels) Transfers: Sit to/from Stand Sit to Stand: Contact guard assist           General transfer comment: STS from slightly elevated bed height with fair use of technique, Min verbal cues to avoid pulling walker towards self      Balance Overall balance assessment: Needs assistance Sitting-balance support: No upper extremity supported, Feet supported Sitting balance-Leahy Scale: Normal Sitting balance - Comments: Steading reaching outside BOS during LB dressing   Standing balance support: Bilateral upper extremity supported, During functional activity Standing balance-Leahy Scale: Fair Standing balance comment: PRN bilateral UE support on RW during static standing                           ADL either performed or assessed with  clinical judgement   ADL Overall ADL's : Needs assistance/impaired Eating/Feeding: Set up;Sitting   Grooming: Wash/dry face;Wash/dry hands;Set up;Sitting   Upper Body Bathing: Set up;Sitting   Lower Body  Bathing: Moderate assistance;Sit to/from stand Lower Body Bathing Details (indicate cue type and reason): Requested assistance to ensure cleanliness, as pt has difficulty reaching back at times Upper Body Dressing : Standing;Set up   Lower Body Dressing: Modified independent;Sitting/lateral leans Lower Body Dressing Details (indicate cue type and reason): Utilized figure 4 method when donning socks in sitting Toilet Transfer: Ambulation;Rolling walker (2 wheels);Contact guard assist Toilet Transfer Details (indicate cue type and reason): CGA for simulated toilet transfers Toileting- Clothing Manipulation and Hygiene: Moderate assistance;Sit to/from stand       Functional mobility during ADLs: Contact guard assist;Rolling walker (2 wheels) General ADL Comments: CGA for safety during standing, limited standing ADL due to reported dizziness on initial supine<>sit on the EOB     Vision Baseline Vision/History: 1 Wears glasses Patient Visual Report: No change from baseline                         Pertinent Vitals/Pain Pain Assessment Pain Assessment: No/denies pain     Extremity/Trunk Assessment Upper Extremity Assessment Upper Extremity Assessment: Generalized weakness   Lower Extremity Assessment Lower Extremity Assessment: Defer to PT evaluation   Cervical / Trunk Assessment Cervical / Trunk Assessment: Normal   Communication Communication Communication: No apparent difficulties Factors Affecting Communication: Hearing impaired   Cognition Arousal: Alert Behavior During Therapy: WFL for tasks assessed/performed Cognition: No apparent impairments             OT - Cognition Comments: A/Ox4                 Following commands: Intact       Cueing  General Comments   Cueing Techniques: Verbal cues  On arrival to room pt linen soild, pt verbalizes leaking at the purewick opening. Pt eager to return home as he reports very supportive family and  adaptive home enviroment to maximize safety.   Exercises Exercises: Other exercises Other Exercises Other Exercises: Edu: Role of acute OT eval, safe ADL completion, fall prevention and DME management         Home Living Family/patient expects to be discharged to:: Private residence Living Arrangements: Spouse/significant other Available Help at Discharge: Family;Available 24 hours/day Type of Home: House Home Access: Stairs to enter [employer] of Steps: 3 Entrance Stairs-Rails: Right Home Layout: One level     Bathroom Shower/Tub: Producer, television/film/video: Handicapped height Bathroom Accessibility: Yes   Home Equipment: Agricultural consultant (2 wheels);Shower seat;Grab bars - tub/shower;Grab bars - toilet;Other (comment)   Additional Comments: wears 2-5 L O2 at night , CPAP at night but if gets short of breath during day will put it on.      Prior Functioning/Environment Prior Level of Function : Independent/Modified Independent;Driving;History of Falls (last six months)             Mobility Comments: MODI with use of RW at all times ADLs Comments: Pt is Mod I for ADLs, supervision for bathing as a precaution. Wife prepares all meals    OT Problem List: Decreased strength;Impaired balance (sitting and/or standing);Decreased activity tolerance;Decreased safety awareness;Decreased coordination;Decreased knowledge of use of DME or AE   OT Treatment/Interventions: Self-care/ADL training;Therapeutic exercise;Energy conservation;DME and/or AE instruction;Therapeutic activities;Patient/family education;Balance training      OT Goals(Current goals can be found  in the care plan section)   Acute Rehab OT Goals Patient Stated Goal: Return home OT Goal Formulation: With patient Time For Goal Achievement: 04/21/24 Potential to Achieve Goals: Good ADL Goals Pt Will Perform Grooming: with modified independence;standing Pt Will Perform Lower Body Dressing: sit  to/from stand;with modified independence Pt Will Transfer to Toilet: ambulating;with modified independence Pt Will Perform Toileting - Clothing Manipulation and hygiene: with modified independence;sitting/lateral leans   OT Frequency:  Min 2X/week                  AM-PAC OT 6 Clicks Daily Activity     Outcome Measure Help from another person eating meals?: None Help from another person taking care of personal grooming?: A Little Help from another person toileting, which includes using toliet, bedpan, or urinal?: A Little Help from another person bathing (including washing, rinsing, drying)?: A Little Help from another person to put on and taking off regular upper body clothing?: None Help from another person to put on and taking off regular lower body clothing?: None 6 Click Score: 21   End of Session Equipment Utilized During Treatment: Gait belt;Rolling walker (2 wheels) Nurse Communication: Mobility status  Activity Tolerance: Patient tolerated treatment well Patient left: in chair;with call bell/phone within reach;with chair alarm set  OT Visit Diagnosis: Unsteadiness on feet (R26.81);Other abnormalities of gait and mobility (R26.89);Muscle weakness (generalized) (M62.81)                Time: 8997-8977 OT Time Calculation (min): 20 min Charges:  OT General Charges $OT Visit: 1 Visit OT Evaluation $OT Eval Moderate Complexity: 1 Mod  Mike Hunter M.S. OTR/L  04/07/24, 10:46 AM

## 2024-04-07 NOTE — Inpatient Diabetes Management (Signed)
 Inpatient Diabetes Program Recommendations  AACE/ADA: New Consensus Statement on Inpatient Glycemic Control (2015)  Target Ranges:  Prepandial:   less than 140 mg/dL      Peak postprandial:   less than 180 mg/dL (1-2 hours)      Critically ill patients:  140 - 180 mg/dL   Lab Results  Component Value Date   GLUCAP 363 (H) 04/07/2024   HGBA1C 6.7 (H) 01/04/2024    Review of Glycemic Control  Diabetes history: DM2 Outpatient Diabetes medications: Farxiga 10 daily, Novolog  0-13 TID with meals, Tresiba  40 QAM and 46 units QHS Current orders for Inpatient glycemic control: Semglee  10 BID, Novolog  0-9 TID with meals and 0-5 HS On Solumedrol 80 Q12H  HgbA1C - 6.7% BMI - 39.8   Inpatient Diabetes Program Recommendations:    Consider increasing Semglee  to 20 units BID  Consider adding Novolog  3-4 units TID with meals if eating > 50%.  Add CHO mod to heart healthy diet.  Good glycemic control prior to admission.   Thank you. Shona Brandy, RD, LDN, CDCES Inpatient Diabetes Coordinator [phone]

## 2024-04-07 NOTE — Discharge Summary (Signed)
 Physician Discharge Summary   Patient: Mike Hunter MRN: [id number] DOB: April 27, 1951  Admit date:     04/05/2024  Discharge date: 04/07/24  Discharge Physician: Delon Herald   PCP: [name], [name], MD   Recommendations at discharge:   You are being discharged with home health physical and occupational therapy Take prednisone  starting tomorrow (10/24) for 2 more days Resume 3x/week azithromycin  Follow up with Dr. Nikki in 1-2 weeks  Discharge Diagnoses: Principal Problem:   COPD with acute exacerbation (HCC) Active Problems:   DM2 (diabetes mellitus, type 2) (HCC)   CKD stage 3b, GFR 30-44 ml/min (HCC)   Rhinovirus infection   HLD (hyperlipidemia)   Paroxysmal atrial fibrillation (HCC)   Chronic systolic CHF (congestive heart failure) (HCC)   Chronic iron deficiency anemia   Bipolar affective disorder (HCC)   Dementia with behavioral disturbance [facility])    Hospital Course: 73yo with h/o DM, COPD, afib on Xarelto , chronic systolic CHF, stage 3b CKD, and chronic anemia who presented on 10/21 with SOB.  He was found to have a COPD exacerbation from rhinovirus infection.  Assessment and Plan:  Acute COPD exacerbation due to rhinovirus infection 1 week of progressive shortness of breath, tachypnea, productive cough  Respiratory viral panel found to be positive for rhinovirus CXR negative COVID, influenza, RSV negative Antibiotics are not indicated  Continue Solumedrol -> prednisone  at time of dc for 5 total days Scheduled Xopenex /ipratropium nebulizers q6 hours with prn Xopenex  nebulizer  IS, Flutter valve Scheduled Mucinex , prn Tessalon Perles Take azithromycin  three times a week Resume budesonide  nebs, Singulair , Wixhela at home   Type 2 Diabetes Mellitus A1c 6.7, good control Continue glargine, SSI Resume Farxiga Carb modified diet Hyperglycemia for now is related to steroids   Paroxysmal atrial fibrillation Continue amiodarone , Toprol  XL for rate  control Continue Xarelto    Chronic systolic heart failure Last echocardiogram was 9/19 with LVEF 35 to 40% Appears compensated Continue spironolactone , torsemide  Continue Toprol  XL, Plavix  Resume Imdur    CKD Stage 3B Baseline creatinine 1.3-1.8 Appears to be stable at this time Attempt to avoid nephrotoxic medications   Hyperlipidemia Continue atorvastatin    Chronic iron deficiency anemia Baseline hgb range 9-11 Appears to be stable Continue Niferex   Incontinence Continue mirabegron    Bipolar d/o Continue sertraline , doxepin  Reports that he has felt anxious all day and thinks this is driving his tachycardia He is certain he will be more comfortable at home   HTN Marginal BP on presentation Continue Toprol  XL Resume losartan    Dementia Continue donepezil , memantine  Has delirium overnight, although it is unusual because he is able to describe exactly what was happening Feels comfortable with plan for dc today   OSA Continue CPAP   Glaucoma Continue latanoprost    Osteoporosis Resume teriparatide    Class 2 obesity Body mass index is 39.83 kg/m.  Weight loss should be encouraged Outpatient PCP/bariatric medicine f/u encouraged Significantly low or high BMI is associated with higher medical risk including morbidity and mortality        Consultants: None   Procedures: None   Antibiotics: Ceftriaxone  x 1 Azithromycin  x 1  Pain control - [place]  Controlled Substance Reporting System database was reviewed. and patient was instructed, not to drive, operate heavy machinery, perform activities at heights, swimming or participation in water activities or provide baby-sitting services while on Pain, Sleep and Anxiety Medications; until their outpatient Physician has advised to do so again. Also recommended to not to take more than prescribed Pain, Sleep and  Anxiety Medications.   Disposition: Home Diet recommendation:  Cardiac and Carb modified  diet DISCHARGE MEDICATION: Allergies as of 04/07/2024       Reactions   Buprenorphine Hcl Nausea And Vomiting   Hydromorphone  Other (See Comments)   Mental Status Changes/HALLUCINATIONS   Codeine Nausea And Vomiting   Mirtazapine Other (See Comments)   Nightmares & altered mental status   Morphine  And Codeine Nausea And Vomiting        Medication List     TAKE these medications    albuterol  108 (90 Base) MCG/ACT inhaler Commonly known as: VENTOLIN  HFA Inhale 2 puffs into the lungs every 4 (four) hours as needed for wheezing or shortness of breath.   amiodarone  200 MG tablet Commonly known as: PACERONE  Take 1 tablet (200 mg total) by mouth 2 (two) times daily.   atorvastatin  80 MG tablet Commonly known as: LIPITOR  Take 80 mg by mouth at bedtime.   atorvastatin  80 MG tablet Commonly known as: LIPITOR  Take 1 tablet (80 mg total) by mouth daily.   azithromycin  250 MG tablet Commonly known as: ZITHROMAX  Take 250 mg by mouth every Monday, Wednesday, and Friday.   benzonatate 200 MG capsule Commonly known as: TESSALON Take 1 capsule (200 mg total) by mouth 3 (three) times daily as needed for cough.   budesonide  0.5 MG/2ML nebulizer solution Commonly known as: PULMICORT  Take 2 mLs by nebulization 2 (two) times daily.   clopidogrel  75 MG tablet Commonly known as: PLAVIX  Take 75 mg by mouth daily.   donepezil  10 MG tablet Commonly known as: ARICEPT  Take 10 mg by mouth at bedtime.   doxepin  10 MG capsule Commonly known as: SINEQUAN  Take 10 mg by mouth at bedtime.   Farxiga 10 MG Tabs tablet Generic drug: dapagliflozin propanediol Take 10 mg by mouth daily.   guaiFENesin  600 MG 12 hr tablet Commonly known as: MUCINEX  Take 1 tablet (600 mg total) by mouth 2 (two) times daily.   iron polysaccharides 150 MG capsule Commonly known as: NIFEREX Take 150 mg by mouth 2 (two) times daily.   isosorbide  mononitrate 30 MG 24 hr tablet Commonly known as: IMDUR  Take  30 mg by mouth daily.   losartan  25 MG tablet Commonly known as: COZAAR  Take 1 tablet (25 mg total) by mouth daily.   memantine  10 MG tablet Commonly known as: NAMENDA  Take 10 mg by mouth 2 (two) times daily.   metoprolol  succinate 50 MG 24 hr tablet Commonly known as: TOPROL -XL Take 1 tablet (50 mg total) by mouth in the morning and at bedtime. Take with or immediately following a meal.   mirabegron  ER 50 MG Tb24 tablet Commonly known as: MYRBETRIQ  Take 50 mg by mouth daily.   montelukast  10 MG tablet Commonly known as: SINGULAIR  Take 10 mg by mouth at bedtime.   multivitamin with minerals Tabs tablet Take 1 tablet by mouth daily with breakfast.   NovoLOG  FlexPen 100 UNIT/ML injection Generic drug: insulin  aspart Inject 0-13 Units into the skin See admin instructions. Inject 0-13 units into the skin three times a day with meals, PER SLIDING SCALE   omeprazole 40 MG capsule Commonly known as: PRILOSEC Take 1 capsule by mouth at bedtime.   oxybutynin  10 MG 24 hr tablet Commonly known as: DITROPAN -XL Take 10 mg by mouth at bedtime.   predniSONE  20 MG tablet Commonly known as: DELTASONE  Take 1 tablet (20 mg total) by mouth daily with breakfast for 2 days.   PRESERVISION AREDS PO Take 1  capsule by mouth in the morning and at bedtime.   sertraline  100 MG tablet Commonly known as: ZOLOFT  Take 100 mg by mouth daily.   spironolactone  25 MG tablet Commonly known as: ALDACTONE  Take 25 mg by mouth daily.   Teriparatide  560 MCG/2.24ML Sopn Inject 20 mcg into the skin at bedtime.   torsemide  20 MG tablet Commonly known as: DEMADEX  Take 1 tablet (20 mg total) by mouth daily.   Tresiba  FlexTouch 100 UNIT/ML FlexTouch Pen Generic drug: insulin  degludec Inject 40-46 Units into the skin See admin instructions. Inject 40 units into the skin in the morning and 46 units at bedtime   Wixela Inhub 250-50 MCG/ACT Aepb Generic drug: fluticasone -salmeterol Inhale 1 puff into  the lungs in the morning and at bedtime.   Xalatan  0.005 % ophthalmic solution Generic drug: latanoprost  Place 1 drop into both eyes at bedtime.   Xarelto  15 MG Tabs tablet Generic drug: Rivaroxaban  Take 15 mg by mouth daily with supper.        Follow-up Information     [name], [name], MD. Schedule an appointment as soon as possible for a visit in 1 week(s).   Specialty: Family Medicine Contact information: [phone] [place] [address] [phone]                Discharge Exam:   Subjective: Feeling better.  Had delirium overnight, which caused him to tell me about his known h/o dementia as well as bipolar d/o.   Objective: Vitals:   04/07/24 0755 04/07/24 0945  BP:  111/79  Pulse:  (!) 115  Resp:    Temp:    SpO2: 100% 98%    Intake/Output Summary (Last 24 hours) at 04/07/2024 1401 Last data filed at 04/07/2024 0916 Gross per 24 hour  Intake 237 ml  Output 3950 ml  Net -3713 ml   Filed Weights   04/05/24 2237 04/07/24 0531  Weight: 121.5 kg 125.9 kg    Exam:  General:  Appears calm and comfortable and is in NAD, very conversant without dyspnea Eyes:  normal lids, iris ENT:  grossly normal hearing, lips & tongue, mmm Cardiovascular:  RR with mild tachycardia. No LE edema.  Respiratory:   CTAB,  Normal respiratory effort on RA.  Abdomen:  soft, NT, ND Skin:  no rash or induration seen on limited exam Musculoskeletal:  grossly normal tone BUE/BLE, good ROM, no bony abnormality Psychiatric:  grossly normal mood and affect, speech fluent and appropriate, AOx3 Neurologic:  CN 2-12 grossly intact, moves all extremities in coordinated fashion  Data Reviewed: I have reviewed the patient's lab results since admission.  Pertinent labs for today include:  Glucose 396 BUN 51/Creatinine 1.68/GFR 42, at baseline WBC 14.8 (steroids) Hgb 9.9 Platelets 428 Blood cultures NTD     Condition at discharge: improving  The results  of significant diagnostics from this hospitalization (including imaging, microbiology, ancillary and laboratory) are listed below for reference.   Imaging Studies: DG Chest 2 View Result Date: 04/05/2024 EXAM: 2 VIEW(S) XRAY OF THE CHEST 04/05/2024 01:34:31 PM COMPARISON: 03/05/2024 CLINICAL HISTORY: Chest heaviness and pain, SHOB, cough, chills for 1 week. Denies N/V. Tachypneic, appears SHOB after walking to triage. FINDINGS: LUNGS AND PLEURA: No focal pulmonary opacity. No pulmonary edema. No pleural effusion. No pneumothorax. HEART AND MEDIASTINUM: Left pacemaker in place. No acute abnormality of the cardiac and mediastinal silhouettes. BONES AND SOFT TISSUES: Lower cervical spine hardware noted. Stable lower thoracic spine vertebral body height loss  compared to prior exam. No acute osseous abnormality. IMPRESSION: 1. No acute cardiopulmonary process. Electronically signed by: Waddell Calk MD 04/05/2024 03:07 PM EDT RP Workstation: [id number]   CUP PACEART INCLINIC DEVICE CHECK Result Date: 03/22/2024 Normal in-clinic dual chamber pacemaker check. Presenting Rhythm: AP/VS 60 . Routine testing of thresholds, sensing, and impedance demonstrate stable parameters. Reprogrammed VT/VF zones per Dr. Almetta.  See attachment. Pt enrolled in remote follow-up.Randall Sharps, BSN, RN  CUP PACEART INCLINIC DEVICE CHECK Result Date: 03/15/2024 Dual chamber ICD wound check. Wound well healed. Presenting rhythm: AFib/flutter/VS with RVR.  Unable to test atrial or ventricular threshold.  Impedance and sensing WNL.  Pt seen by Dr. Almetta and scheduled for cardioversion. Reviewed arm restrictions  to continue for 6 weeks total post op. Reviewed shock plan.  Pt enrolled in remote follow-up. Will see Pt next week post DCCV for device check for thresholds.Randall Sharps, BSN, RN   Microbiology: Results for orders placed or performed during the hospital encounter of 04/05/24  Blood culture (routine x 2)     Status:  None (Preliminary result)   Collection Time: 04/05/24  1:57 PM   Specimen: BLOOD  Result Value Ref Range Status   Specimen Description   Final    BLOOD RIGHT ANTECUBITAL Performed at [facility], [address] [place], [place] [address]    Special Requests   Final    BOTTLES DRAWN AEROBIC AND ANAEROBIC Blood Culture results may not be optimal due to an inadequate volume of blood received in culture bottles Performed at [facility], [address] [place], [place] [address]    Culture   Final    NO GROWTH 2 DAYS Performed at [facility], [address], [place], [place] [address]    Report Status PENDING  Incomplete  Blood culture (routine x 2)     Status: None (Preliminary result)   Collection Time: 04/05/24  2:02 PM   Specimen: BLOOD RIGHT FOREARM  Result Value Ref Range Status   Specimen Description   Final    BLOOD RIGHT FOREARM Performed at [facility], [address] [place], [place] [address]    Special Requests   Final    BOTTLES DRAWN AEROBIC AND ANAEROBIC Blood Culture results may not be optimal due to an inadequate volume of blood received in culture bottles Performed at [facility], [address] [place], [place] [address]    Culture   Final    NO GROWTH 2 DAYS Performed at [facility], [address], [place], [place] [address]    Report Status PENDING  Incomplete  Resp panel by RT-PCR (RSV, Flu A&B, Covid) Anterior Nasal Swab     Status: None   Collection Time: 04/05/24  2:10 PM   Specimen: Anterior Nasal Swab  Result Value Ref Range Status   SARS Coronavirus 2 by RT PCR NEGATIVE NEGATIVE Final    Comment: (NOTE) SARS-CoV-2 target nucleic acids are NOT DETECTED.  The SARS-CoV-2 RNA is generally detectable in upper respiratory specimens during the acute phase of infection. The lowest concentration of SARS-CoV-2 viral copies this assay can detect is 138 copies/mL. A negative result does not  preclude SARS-Cov-2 infection and should not be used as the sole basis for treatment or other patient management decisions. A negative result may occur with  improper specimen collection/handling, submission of specimen other than nasopharyngeal swab, presence of viral mutation(s) within the areas targeted  by this assay, and inadequate number of viral copies(<138 copies/mL). A negative result must be combined with clinical observations, patient history, and epidemiological information. The expected result is Negative.  Fact Sheet for Patients:  BloggerCourse.com  Fact Sheet for Healthcare Providers:  SeriousBroker.it  This test is no t yet approved or cleared by the [place]  FDA and  has been authorized for detection and/or diagnosis of SARS-CoV-2 by FDA under an Emergency Use Authorization (EUA). This EUA will remain  in effect (meaning this test can be used) for the duration of the COVID-19 declaration under Section 564(b)(1) of the Act, 21 U.S.C.section 360bbb-3(b)(1), unless the authorization is terminated  or revoked sooner.       Influenza A by PCR NEGATIVE NEGATIVE Final   Influenza B by PCR NEGATIVE NEGATIVE Final    Comment: (NOTE) The Xpert Xpress SARS-CoV-2/FLU/RSV plus assay is intended as an aid in the diagnosis of influenza from Nasopharyngeal swab specimens and should not be used as a sole basis for treatment. Nasal washings and aspirates are unacceptable for Xpert Xpress SARS-CoV-2/FLU/RSV testing.  Fact Sheet for Patients: BloggerCourse.com  Fact Sheet for Healthcare Providers: SeriousBroker.it  This test is not yet approved or cleared by the [place]  FDA and has been authorized for detection and/or diagnosis of SARS-CoV-2 by FDA under an Emergency Use Authorization (EUA). This EUA will remain in effect (meaning this test can be used) for the  duration of the COVID-19 declaration under Section 564(b)(1) of the Act, 21 U.S.C. section 360bbb-3(b)(1), unless the authorization is terminated or revoked.     Resp Syncytial Virus by PCR NEGATIVE NEGATIVE Final    Comment: (NOTE) Fact Sheet for Patients: BloggerCourse.com  Fact Sheet for Healthcare Providers: SeriousBroker.it  This test is not yet approved or cleared by the [place]  FDA and has been authorized for detection and/or diagnosis of SARS-CoV-2 by FDA under an Emergency Use Authorization (EUA). This EUA will remain in effect (meaning this test can be used) for the duration of the COVID-19 declaration under Section 564(b)(1) of the Act, 21 U.S.C. section 360bbb-3(b)(1), unless the authorization is terminated or revoked.  Performed at [facility], [address] [place], [place] [address]   Respiratory (~20 pathogens) panel by PCR     Status: Abnormal   Collection Time: 04/05/24  3:50 PM   Specimen: Nasopharyngeal Swab; Respiratory  Result Value Ref Range Status   Adenovirus NOT DETECTED NOT DETECTED Final   Coronavirus 229E NOT DETECTED NOT DETECTED Final    Comment: (NOTE) The Coronavirus on the Respiratory Panel, DOES NOT test for the novel  Coronavirus (2019 nCoV)    Coronavirus HKU1 NOT DETECTED NOT DETECTED Final   Coronavirus NL63 NOT DETECTED NOT DETECTED Final   Coronavirus OC43 NOT DETECTED NOT DETECTED Final   Metapneumovirus NOT DETECTED NOT DETECTED Final   Rhinovirus / Enterovirus DETECTED (A) NOT DETECTED Final   Influenza A NOT DETECTED NOT DETECTED Final   Influenza B NOT DETECTED NOT DETECTED Final   Parainfluenza Virus 1 NOT DETECTED NOT DETECTED Final   Parainfluenza Virus 2 NOT DETECTED NOT DETECTED Final   Parainfluenza Virus 3 NOT DETECTED NOT DETECTED Final   Parainfluenza Virus 4 NOT DETECTED NOT DETECTED Final   Respiratory Syncytial Virus NOT DETECTED NOT DETECTED  Final   Bordetella pertussis NOT DETECTED NOT DETECTED Final   Bordetella Parapertussis NOT DETECTED NOT DETECTED Final   Chlamydophila pneumoniae NOT DETECTED NOT DETECTED Final   Mycoplasma pneumoniae NOT DETECTED NOT  DETECTED Final    Comment: Performed at [facility], [address], [place], [place] [address]    Labs: CBC: Recent Labs  Lab 04/05/24 1343 04/06/24 0554 04/07/24 0533  WBC 11.1* 10.2 14.8*  NEUTROABS 8.0* 8.8* 13.7*  HGB 10.4* 10.1* 9.9*  HCT 35.4* 36.0* 35.8*  MCV 74.2* 76.1* 76.3*  PLT 447* 395 428*   Basic Metabolic Panel: Recent Labs  Lab 04/05/24 1343 04/06/24 0554 04/07/24 0533 04/07/24 0812  NA 138 141 139  --   K 4.3 4.5 4.5  --   CL 103 107 103  --   CO2 20* 21* 21*  --   GLUCOSE 106* 196* 396* 409*  BUN 33* 37* 51*  --   CREATININE 1.60* 1.46* 1.68*  --   CALCIUM  9.0 9.2 9.2  --   MG  --  2.9*  --   --   PHOS  --  4.1  --   --    Liver Function Tests: Recent Labs  Lab 04/05/24 1343 04/06/24 0554  AST 31 20  ALT 24 26  ALKPHOS 108 108  BILITOT 0.4 0.4  PROT 7.6 7.4  ALBUMIN  4.0 3.9   CBG: Recent Labs  Lab 04/06/24 1138 04/06/24 1613 04/06/24 2129 04/07/24 0723 04/07/24 1125  GLUCAP 221* 365* 323* 401* 363*    Discharge time spent: greater than 30 minutes.  Signed: Delon Herald, MD [facility] 04/07/2024

## 2024-04-07 NOTE — Telephone Encounter (Signed)
 Pre-Ablation lab orders placed.

## 2024-04-07 NOTE — Evaluation (Signed)
 Physical Therapy Evaluation Patient Details Name: Mike Hunter MRN: [id number] DOB: 10/03/1950 Today's Date: 04/07/2024  History of Present Illness  Mike Hunter is a 73 y.o. male with medical history significant for type 2 diabetes mellitus, COPD, paroxysmal atrial fibrillation chronically anticoagulated on Xarelto , chronic systolic heart failure, CKD 3B with baseline creatinine 1.3-1.8, chronic iron deficiency anemia with baseline hemoglobin 9-11, who is admitted to [facility] on 04/05/2024 by way of transfer from [facility] with acute COPD exacerbation in the setting of viral lower respiratory tract infection after presenting from home to the latter facility complaining of shortness of breath.  Clinical Impression  Patient presents with decreased mobility due to decreased activity tolerance and generalized weakness.  Reports normally mobilizing with walker and has home set up so he can perform ADL's though wife available to assist.  Patient was able to stand from recliner with S with UE support and though intermittent stops to cough was able to ambulate in hallway with RW and CGA to S.  Patient stable for home with wife support and to resume HHPT.        If plan is discharge home, recommend the following: Help with stairs or ramp for entrance;Assist for transportation;Assistance with cooking/housework;A little help with bathing/dressing/bathroom   Can travel by private vehicle        Equipment Recommendations None recommended by PT (reports bari rollator already on order)  Recommendations for Other Services       Functional Status Assessment Patient has had a recent decline in their functional status and demonstrates the ability to make significant improvements in function in a reasonable and predictable amount of time.     Precautions / Restrictions Precautions Precautions: Fall Recall of Precautions/Restrictions: Intact      Mobility  Bed Mobility                General bed mobility comments: in recliner    Transfers Overall transfer level: Needs assistance Equipment used: Rolling walker (2 wheels) Transfers: Sit to/from Stand Sit to Stand: Supervision           General transfer comment: heavy reliance on UE's to stand    Ambulation/Gait Ambulation/Gait assistance: Contact guard assist Gait Distance (Feet): 80 Feet Assistive device: Rolling walker (2 wheels) Gait Pattern/deviations: Step-through pattern, Trunk flexed       General Gait Details: coughing x1 standing at door and x 1 in hallway stops to cough, needing CGA esp on turnsSpO2 99% after ambulating on Hunter, dyspnea 3/4  Stairs            Wheelchair Mobility     Tilt Bed    Modified Rankin (Stroke Patients Only)       Balance Overall balance assessment: Needs assistance   Sitting balance-Leahy Scale: Good     Standing balance support: Bilateral upper extremity supported Standing balance-Leahy Scale: Fair Standing balance comment: can stand without UE support statically, needs RW for ambulation                             Pertinent Vitals/Pain Pain Assessment Pain Assessment: No/denies pain    Home Living Family/patient expects to be discharged to:: Private residence Living Arrangements: Spouse/significant other Available Help at Discharge: Family;Available 24 hours/day Type of Home: House Home Access: Stairs to enter Entrance Stairs-Rails: Right Entrance Stairs-Number of Steps: 3   Home Layout: One level Home Equipment: Agricultural consultant (2 wheels);Shower seat;Grab bars -  tub/shower;Grab bars - toilet;Other (comment) Additional Comments: wears 2-5 L O2 at night , CPAP at night but if gets short of breath during day will put it on.  Reports RW is too short, has rollator (bariatric) on order through [facility]    Prior Function Prior Level of Function : Independent/Modified Independent;Driving;History of Falls (last six months)              Mobility Comments: MODI with use of RW at all times ADLs Comments: Pt is Mod I for ADLs, supervision for bathing as a precaution. Wife prepares all meals     Extremity/Trunk Assessment   Upper Extremity Assessment Upper Extremity Assessment: Defer to OT evaluation    Lower Extremity Assessment Lower Extremity Assessment: Generalized weakness    Cervical / Trunk Assessment Cervical / Trunk Assessment: Kyphotic  Communication   Communication Communication: No apparent difficulties Factors Affecting Communication: Hearing impaired    Cognition Arousal: Alert Behavior During Therapy: WFL for tasks assessed/performed                             Following commands: Intact       Cueing Cueing Techniques: Verbal cues     General Comments General comments (skin integrity, edema, etc.): HR 122, SpO2 100%    Exercises     Assessment/Plan    PT Assessment Patient needs continued PT services  PT Problem List Decreased strength;Decreased activity tolerance;Decreased balance;Decreased mobility;Cardiopulmonary status limiting activity       PT Treatment Interventions      PT Goals (Current goals can be found in the Care Plan section)  Acute Rehab PT Goals Patient Stated Goal: go home today if able PT Goal Formulation: With patient Time For Goal Achievement: 04/14/24 Potential to Achieve Goals: Good    Frequency Min 2X/week     Co-evaluation               AM-PAC PT 6 Clicks Mobility  Outcome Measure Help needed turning from your back to your side while in a flat bed without using bedrails?: None Help needed moving from lying on your back to sitting on the side of a flat bed without using bedrails?: None Help needed moving to and from a bed to a chair (including a wheelchair)?: A Little Help needed standing up from a chair using your arms (e.g., wheelchair or bedside chair)?: None Help needed to walk in hospital room?: A  Little Help needed climbing 3-5 steps with a railing? : A Little 6 Click Score: 21    End of Session   Activity Tolerance: Patient tolerated treatment well Patient left: in chair;with call bell/phone within reach   PT Visit Diagnosis: Muscle weakness (generalized) (M62.81);Other abnormalities of gait and mobility (R26.89)    Time: 8776-8749 PT Time Calculation (min) (ACUTE ONLY): 27 min   Charges:   PT Evaluation $PT Eval Low Complexity: 1 Low PT Treatments $Gait Training: 8-22 mins PT General Charges $$ ACUTE PT VISIT: 1 Visit         Micheline Portal, PT Acute Rehabilitation Services Office:[phone] 04/07/2024   Montie Portal 04/07/2024, 1:25 PM

## 2024-04-08 ENCOUNTER — Telehealth: Payer: Self-pay | Admitting: Student in an Organized Health Care Education/Training Program

## 2024-04-08 NOTE — Telephone Encounter (Signed)
 Pt has hospital follow up scheduled with [facility].

## 2024-04-08 NOTE — Telephone Encounter (Signed)
 Left voice message to call back 10/24

## 2024-04-08 NOTE — Telephone Encounter (Signed)
 Patient was in [facility] for 2 days. This morning he had a hard time catching his breath. Calling to let the dr know. Please advise

## 2024-04-08 NOTE — Telephone Encounter (Signed)
 Pt called in wanting a call back from nurse. See msg below

## 2024-04-10 LAB — CULTURE, BLOOD (ROUTINE X 2)
Culture: NO GROWTH
Culture: NO GROWTH

## 2024-04-11 ENCOUNTER — Other Ambulatory Visit: Payer: Self-pay | Admitting: Student in an Organized Health Care Education/Training Program

## 2024-04-11 ENCOUNTER — Telehealth: Payer: Self-pay | Admitting: Student in an Organized Health Care Education/Training Program

## 2024-04-11 ENCOUNTER — Other Ambulatory Visit: Payer: Self-pay

## 2024-04-11 MED ORDER — METOPROLOL SUCCINATE ER 50 MG PO TB24: 50.0000 mg | ORAL_TABLET | Freq: Two times a day (BID) | ORAL | 3 refills | Status: AC

## 2024-04-11 NOTE — Telephone Encounter (Signed)
 Pt c/o medication issue:  1. Name of Medication:  metoprolol  succinate (TOPROL -XL) 50 MG 24 hr tablet   2. How are you currently taking this medication (dosage and times per day)? 2x daily   3. Are you having a reaction (difficulty breathing--STAT)? No   4. What is your medication issue? Pt called in stating this med should be 2x daily, please advise if this can be changed.

## 2024-04-11 NOTE — Telephone Encounter (Signed)
 Spoke with pt who is requesting a refill of his metoprolol  succinate 50 mg twice daily. Refill sent to pt's pharmacy of choice. Pt verbalized understanding. All questions if any were answered.

## 2024-04-11 NOTE — Telephone Encounter (Signed)
 Per discharge paperwork it looks like patient is supposed to be on twice daily of Metoprolol  Succinate 50 mg. Please advised if this is supposed to be different.

## 2024-04-12 ENCOUNTER — Ambulatory Visit (INDEPENDENT_AMBULATORY_CARE_PROVIDER_SITE_OTHER)

## 2024-04-12 DIAGNOSIS — I480 Paroxysmal atrial fibrillation: Secondary | ICD-10-CM | POA: Diagnosis not present

## 2024-04-13 LAB — CUP PACEART REMOTE DEVICE CHECK
Battery Remaining Longevity: 132 mo
Battery Remaining Percentage: 100 %
Brady Statistic RA Percent Paced: 0 %
Brady Statistic RV Percent Paced: 8 %
Date Time Interrogation Session: 20251028003100
HighPow Impedance: 70 Ohm
Implantable Lead Connection Status: 753985
Implantable Lead Connection Status: 753985
Implantable Lead Implant Date: 20250916
Implantable Lead Implant Date: 20250916
Implantable Lead Location: 753859
Implantable Lead Location: 753860
Implantable Lead Model: 673
Implantable Lead Model: 7841
Implantable Lead Serial Number: 1653982
Implantable Lead Serial Number: 272899
Implantable Pulse Generator Implant Date: 20250916
Lead Channel Impedance Value: 458 Ohm
Lead Channel Impedance Value: 644 Ohm
Lead Channel Setting Pacing Amplitude: 3.5 V
Lead Channel Setting Pacing Amplitude: 3.5 V
Lead Channel Setting Pacing Pulse Width: 0.4 ms
Lead Channel Setting Sensing Sensitivity: 0.5 mV
Pulse Gen Serial Number: 703705
Zone Setting Status: 755011

## 2024-04-14 NOTE — Telephone Encounter (Signed)
 See 04/11/2024 encounter for complete information.

## 2024-04-19 NOTE — Progress Notes (Signed)
 Remote ICD Transmission

## 2024-04-20 ENCOUNTER — Telehealth: Payer: Self-pay

## 2024-04-20 ENCOUNTER — Telehealth: Payer: Self-pay | Admitting: Student in an Organized Health Care Education/Training Program

## 2024-04-20 NOTE — Telephone Encounter (Signed)
-----   Message from Nurse Aldona R sent at 04/07/2024  2:32 PM EDT ----- Regarding: 12/1 fib/flutter Almetta Important: list procedure date as first item in subject line, followed by procedure type (e.g., 02/26/24 AFib ablation)  Precert:  MD: Almetta Type of ablation: Fib/Flutter Diagnosis: Fib and flutter CPT code: A-fib (06343) 06344 Ablation scheduled (date/time): 12/1 930  Procedure:  Added to calendar? Yes Orders entered? No, >30 days before procedure Letter complete? No, >30 days before procedure Scheduled with cath lab? Yes Any medications to hold? Pt on Plavix  for stent placement 5/25 with [facility], probably cannot hold - will ask [facility]. Labs ordered (CBC, BMET, PT/INR if on warfarin): Yes Mapping system: CARTO (lab 4 or 6) CARTO/OPAL rep notified? Yes Cardiac CT needed? No Dye allergy? No Pre-meds ordered and instructions given?N/A Letter method: MyChart H&P: 9/30 Device: PPM  Follow-up:  Cassie/Angel, please schedule Routine.  Covering RN - please send this message to [employer], EP scheduler, EP Scheduling pool, EP [employer], and CT scheduler (Brittany Lynch/Stephanie Mogg), if indicated.

## 2024-04-20 NOTE — Telephone Encounter (Signed)
 Called patient back and reassured him that the blinking lights were part of the device normal activity and that we received a transmission from his device yesterday so it is connected.  All questions and concerns addressed at this time  Patient appreciative of call back

## 2024-04-20 NOTE — Telephone Encounter (Signed)
 Pt stated his communicator kept going on and off and recently kept staying on. Please advise

## 2024-04-22 ENCOUNTER — Encounter (HOSPITAL_COMMUNITY): Payer: Self-pay

## 2024-04-22 ENCOUNTER — Telehealth (HOSPITAL_COMMUNITY): Payer: Self-pay

## 2024-04-22 NOTE — Telephone Encounter (Signed)
 Attempted to reach patient to discuss upcoming procedure, no answer. Left VM for patient to return call on cell number. No VM available at home number.

## 2024-04-25 NOTE — Telephone Encounter (Signed)
 Spoke with patient and wife to complete pre-procedure call.     Health status review:  Any new medical conditions, recent signs of acute illness or been started on antibiotics? No Any recent hospitalizations or surgeries? No Any new medications started since pre-op visit? No  Follow all medication instructions prior to procedure or the procedure may be rescheduled:    Continue taking Xarelto  (Rivaroxaban ) once daily without missing any doses before procedure. HOLD: Dapagliflozin Pauletta) for 3 days prior to the procedure. Last dose on Thursday, November 27.  Tresiba  (Insulin ): The day before your procedure only take  of your usual dinner/bedtime dose. The morning of your procedure you will only take  of your usual dose.    Novolog  Flexpen (Insulin ): No bedtime dose the day before your procedure. The day of your procedure if your CBG is greater than 220 mg/dL, you may take 1/2 of your usual dose. On the morning of your procedure ONLY take Clopidogrel  with a small sip of water.   Nothing to eat or drink after midnight prior to your procedure.  Pre-procedure testing scheduled: CT not needed and lab work by  November 17.  Confirmed patient is scheduled for Atrial Fibrillation Ablation, Atrial Flutter Ablation on Monday, December 1 with Dr. Almetta. Instructed patient to arrive at the Main Entrance A at [facility]: [address] [place], [place] [address] and check in at Admitting at 7:30 AM.  Plan to go home the same day, you will only stay overnight if medically necessary. You MUST have a responsible adult to drive you home and MUST be with you the first 24 hours after you arrive home or your procedure could be cancelled.  Informed a nurse may call a day before the procedure to confirm arrival time and ensure instructions are followed.  Patient verbalized understanding to information provided and is agreeable to proceed with procedure.   Advised to contact RN Navigator at  [phone], to inform of any new medications started after call or concerns prior to procedure.

## 2024-04-30 LAB — CBC
Hematocrit: 33.6 % — ABNORMAL LOW (ref 37.5–51.0)
Hemoglobin: 9.7 g/dL — ABNORMAL LOW (ref 13.0–17.7)
MCH: 22 pg — ABNORMAL LOW (ref 26.6–33.0)
MCHC: 28.9 g/dL — ABNORMAL LOW (ref 31.5–35.7)
MCV: 76 fL — ABNORMAL LOW (ref 79–97)
Platelets: 294 x10E3/uL (ref 150–450)
RBC: 4.41 x10E6/uL (ref 4.14–5.80)
RDW: 17 % — ABNORMAL HIGH (ref 11.6–15.4)
WBC: 11.1 x10E3/uL — ABNORMAL HIGH (ref 3.4–10.8)

## 2024-04-30 LAB — BASIC METABOLIC PANEL WITH GFR
BUN/Creatinine Ratio: 21 (ref 10–24)
BUN: 35 mg/dL — ABNORMAL HIGH (ref 8–27)
CO2: 24 mmol/L (ref 20–29)
Calcium: 8.3 mg/dL — ABNORMAL LOW (ref 8.6–10.2)
Chloride: 104 mmol/L (ref 96–106)
Creatinine, Ser: 1.64 mg/dL — ABNORMAL HIGH (ref 0.76–1.27)
Glucose: 185 mg/dL — ABNORMAL HIGH (ref 70–99)
Potassium: 4 mmol/L (ref 3.5–5.2)
Sodium: 142 mmol/L (ref 134–144)
eGFR: 43 mL/min/1.73 — ABNORMAL LOW (ref >59)

## 2024-05-09 ENCOUNTER — Emergency Department (HOSPITAL_COMMUNITY)

## 2024-05-09 ENCOUNTER — Encounter (HOSPITAL_COMMUNITY): Payer: Self-pay | Admitting: Internal Medicine

## 2024-05-09 ENCOUNTER — Inpatient Hospital Stay (HOSPITAL_COMMUNITY)
Admission: EM | Admit: 2024-05-09 | Discharge: 2024-05-21 | DRG: 682 | Disposition: A | Attending: Internal Medicine | Admitting: Internal Medicine

## 2024-05-09 ENCOUNTER — Telehealth: Payer: Self-pay | Admitting: Student in an Organized Health Care Education/Training Program

## 2024-05-09 ENCOUNTER — Other Ambulatory Visit: Payer: Self-pay

## 2024-05-09 DIAGNOSIS — N179 Acute kidney failure, unspecified: Principal | ICD-10-CM | POA: Diagnosis present

## 2024-05-09 DIAGNOSIS — R09A2 Foreign body sensation, throat: Secondary | ICD-10-CM

## 2024-05-09 DIAGNOSIS — W19XXXA Unspecified fall, initial encounter: Secondary | ICD-10-CM | POA: Diagnosis not present

## 2024-05-09 DIAGNOSIS — Z7901 Long term (current) use of anticoagulants: Secondary | ICD-10-CM

## 2024-05-09 DIAGNOSIS — Z794 Long term (current) use of insulin: Secondary | ICD-10-CM

## 2024-05-09 DIAGNOSIS — I5020 Unspecified systolic (congestive) heart failure: Secondary | ICD-10-CM

## 2024-05-09 DIAGNOSIS — I4891 Unspecified atrial fibrillation: Secondary | ICD-10-CM

## 2024-05-09 DIAGNOSIS — J690 Pneumonitis due to inhalation of food and vomit: Secondary | ICD-10-CM

## 2024-05-09 DIAGNOSIS — R1310 Dysphagia, unspecified: Secondary | ICD-10-CM

## 2024-05-09 DIAGNOSIS — N1832 Chronic kidney disease, stage 3b: Secondary | ICD-10-CM | POA: Diagnosis present

## 2024-05-09 DIAGNOSIS — E785 Hyperlipidemia, unspecified: Secondary | ICD-10-CM | POA: Diagnosis present

## 2024-05-09 DIAGNOSIS — Z7902 Long term (current) use of antithrombotics/antiplatelets: Secondary | ICD-10-CM

## 2024-05-09 DIAGNOSIS — K222 Esophageal obstruction: Secondary | ICD-10-CM

## 2024-05-09 DIAGNOSIS — Z981 Arthrodesis status: Secondary | ICD-10-CM

## 2024-05-09 DIAGNOSIS — I5022 Chronic systolic (congestive) heart failure: Secondary | ICD-10-CM

## 2024-05-09 DIAGNOSIS — Z8659 Personal history of other mental and behavioral disorders: Secondary | ICD-10-CM

## 2024-05-09 DIAGNOSIS — E7849 Other hyperlipidemia: Secondary | ICD-10-CM

## 2024-05-09 DIAGNOSIS — Z95810 Presence of automatic (implantable) cardiac defibrillator: Secondary | ICD-10-CM

## 2024-05-09 DIAGNOSIS — D649 Anemia, unspecified: Secondary | ICD-10-CM | POA: Diagnosis not present

## 2024-05-09 DIAGNOSIS — Y92009 Unspecified place in unspecified non-institutional (private) residence as the place of occurrence of the external cause: Secondary | ICD-10-CM

## 2024-05-09 DIAGNOSIS — Z8709 Personal history of other diseases of the respiratory system: Secondary | ICD-10-CM

## 2024-05-09 DIAGNOSIS — I4819 Other persistent atrial fibrillation: Secondary | ICD-10-CM

## 2024-05-09 DIAGNOSIS — G4733 Obstructive sleep apnea (adult) (pediatric): Secondary | ICD-10-CM | POA: Insufficient documentation

## 2024-05-09 DIAGNOSIS — E119 Type 2 diabetes mellitus without complications: Secondary | ICD-10-CM

## 2024-05-09 DIAGNOSIS — N189 Chronic kidney disease, unspecified: Secondary | ICD-10-CM

## 2024-05-09 LAB — HEPATIC FUNCTION PANEL
ALT: 31 U/L (ref 0–44)
AST: 25 U/L (ref 15–41)
Albumin: 3 g/dL — ABNORMAL LOW (ref 3.5–5.0)
Alkaline Phosphatase: 85 U/L (ref 38–126)
Bilirubin, Direct: <0.1 mg/dL (ref 0.0–0.2)
Total Bilirubin: 0.8 mg/dL (ref 0.0–1.2)
Total Protein: 6.4 g/dL — ABNORMAL LOW (ref 6.5–8.1)

## 2024-05-09 LAB — BASIC METABOLIC PANEL WITH GFR
Anion gap: 12 (ref 5–15)
BUN: 32 mg/dL — ABNORMAL HIGH (ref 8–23)
CO2: 23 mmol/L (ref 22–32)
Calcium: 8 mg/dL — ABNORMAL LOW (ref 8.9–10.3)
Chloride: 105 mmol/L (ref 98–111)
Creatinine, Ser: 1.87 mg/dL — ABNORMAL HIGH (ref 0.61–1.24)
GFR, Estimated: 37 mL/min — ABNORMAL LOW (ref >60)
Glucose, Bld: 284 mg/dL — ABNORMAL HIGH (ref 70–99)
Potassium: 3.6 mmol/L (ref 3.5–5.1)
Sodium: 140 mmol/L (ref 135–145)

## 2024-05-09 LAB — URINALYSIS, ROUTINE W REFLEX MICROSCOPIC
Bacteria, UA: NONE SEEN
Bilirubin Urine: NEGATIVE
Glucose, UA: >=500 mg/dL — AB
Hgb urine dipstick: NEGATIVE
Ketones, ur: NEGATIVE mg/dL
Leukocytes,Ua: NEGATIVE
Nitrite: NEGATIVE
Protein, ur: NEGATIVE mg/dL
Specific Gravity, Urine: 1.012 (ref 1.005–1.030)
pH: 5 (ref 5.0–8.0)

## 2024-05-09 LAB — CBC
HCT: 34.9 % — ABNORMAL LOW (ref 39.0–52.0)
Hemoglobin: 9.8 g/dL — ABNORMAL LOW (ref 13.0–17.0)
MCH: 21.4 pg — ABNORMAL LOW (ref 26.0–34.0)
MCHC: 28.1 g/dL — ABNORMAL LOW (ref 30.0–36.0)
MCV: 76.4 fL — ABNORMAL LOW (ref 80.0–100.0)
Platelets: 366 K/uL (ref 150–400)
RBC: 4.57 MIL/uL (ref 4.22–5.81)
RDW: 18.5 % — ABNORMAL HIGH (ref 11.5–15.5)
WBC: 10.6 K/uL — ABNORMAL HIGH (ref 4.0–10.5)
nRBC: 0 % (ref 0.0–0.2)

## 2024-05-09 LAB — TROPONIN I (HIGH SENSITIVITY)
Troponin I (High Sensitivity): 8 ng/L (ref <18)
Troponin I (High Sensitivity): 8 ng/L (ref <18)

## 2024-05-09 LAB — SODIUM, URINE, RANDOM: Sodium, Ur: 44 mmol/L

## 2024-05-09 LAB — CREATININE, URINE, RANDOM: Creatinine, Urine: 47 mg/dL

## 2024-05-09 LAB — BRAIN NATRIURETIC PEPTIDE: B Natriuretic Peptide: 283.7 pg/mL — ABNORMAL HIGH (ref 0.0–100.0)

## 2024-05-09 MED ORDER — AMIODARONE HCL 200 MG PO TABS
200.0000 mg | ORAL_TABLET | Freq: Two times a day (BID) | ORAL | Status: DC
  Administered 2024-05-10 (×3): 200 mg via ORAL
  Filled 2024-05-09 (×3): qty 1

## 2024-05-09 MED ORDER — RIVAROXABAN 15 MG PO TABS
15.0000 mg | ORAL_TABLET | Freq: Every day | ORAL | Status: DC
  Administered 2024-05-10: 15 mg via ORAL
  Filled 2024-05-09: qty 1

## 2024-05-09 MED ORDER — INSULIN ASPART 100 UNIT/ML IJ SOLN: 0.0000 [IU] | Freq: Every day | INTRAMUSCULAR | Status: DC

## 2024-05-09 MED ORDER — INSULIN GLARGINE-YFGN 100 UNIT/ML ~~LOC~~ SOLN
40.0000 [IU] | Freq: Every morning | SUBCUTANEOUS | Status: DC
  Administered 2024-05-10 – 2024-05-14 (×5): 40 [IU] via SUBCUTANEOUS
  Filled 2024-05-09 (×6): qty 0.4

## 2024-05-09 MED ORDER — POLYSACCHARIDE IRON COMPLEX 150 MG PO CAPS
150.0000 mg | ORAL_CAPSULE | Freq: Two times a day (BID) | ORAL | Status: DC
  Administered 2024-05-10 – 2024-05-21 (×22): 150 mg via ORAL
  Filled 2024-05-09 (×23): qty 1

## 2024-05-09 MED ORDER — CLOPIDOGREL BISULFATE 75 MG PO TABS
75.0000 mg | ORAL_TABLET | Freq: Every day | ORAL | Status: DC
  Administered 2024-05-10 – 2024-05-15 (×6): 75 mg via ORAL
  Filled 2024-05-09 (×6): qty 1

## 2024-05-09 MED ORDER — LEVALBUTEROL HCL 0.63 MG/3ML IN NEBU
0.6300 mg | INHALATION_SOLUTION | Freq: Four times a day (QID) | RESPIRATORY_TRACT | Status: DC | PRN
  Administered 2024-05-13 – 2024-05-15 (×2): 0.63 mg via RESPIRATORY_TRACT
  Filled 2024-05-09 (×4): qty 3

## 2024-05-09 MED ORDER — SODIUM CHLORIDE 0.9% FLUSH
3.0000 mL | Freq: Two times a day (BID) | INTRAVENOUS | Status: DC
  Administered 2024-05-10 – 2024-05-21 (×16): 3 mL via INTRAVENOUS

## 2024-05-09 MED ORDER — SODIUM CHLORIDE 0.9% FLUSH
3.0000 mL | Freq: Two times a day (BID) | INTRAVENOUS | Status: DC
  Administered 2024-05-10 – 2024-05-21 (×14): 3 mL via INTRAVENOUS

## 2024-05-09 MED ORDER — MEMANTINE HCL 10 MG PO TABS
10.0000 mg | ORAL_TABLET | Freq: Two times a day (BID) | ORAL | Status: DC
  Administered 2024-05-10 – 2024-05-21 (×23): 10 mg via ORAL
  Filled 2024-05-09 (×23): qty 1

## 2024-05-09 MED ORDER — LACTATED RINGERS IV SOLN: INTRAVENOUS | Status: DC

## 2024-05-09 MED ORDER — ISOSORBIDE MONONITRATE ER 30 MG PO TB24
30.0000 mg | ORAL_TABLET | Freq: Every day | ORAL | Status: DC
  Administered 2024-05-10 – 2024-05-21 (×11): 30 mg via ORAL
  Filled 2024-05-09 (×11): qty 1

## 2024-05-09 MED ORDER — BUDESONIDE-FORMOTEROL FUMARATE 160-4.5 MCG/ACT IN AERO
2.0000 | INHALATION_SPRAY | Freq: Two times a day (BID) | RESPIRATORY_TRACT | Status: DC
  Administered 2024-05-10 – 2024-05-18 (×16): 2 via RESPIRATORY_TRACT
  Filled 2024-05-09 (×2): qty 6

## 2024-05-09 MED ORDER — MONTELUKAST SODIUM 10 MG PO TABS
10.0000 mg | ORAL_TABLET | Freq: Every day | ORAL | Status: DC
  Administered 2024-05-10 – 2024-05-20 (×12): 10 mg via ORAL
  Filled 2024-05-09 (×12): qty 1

## 2024-05-09 MED ORDER — GUAIFENESIN ER 600 MG PO TB12
600.0000 mg | ORAL_TABLET | Freq: Two times a day (BID) | ORAL | Status: DC
  Administered 2024-05-10 – 2024-05-14 (×10): 600 mg via ORAL
  Filled 2024-05-09 (×10): qty 1

## 2024-05-09 MED ORDER — IOHEXOL 350 MG/ML SOLN
75.0000 mL | Freq: Once | INTRAVENOUS | Status: AC | PRN
  Administered 2024-05-09: 75 mL via INTRAVENOUS

## 2024-05-09 MED ORDER — INSULIN ASPART 100 UNIT/ML IJ SOLN
0.0000 [IU] | Freq: Every day | INTRAMUSCULAR | Status: DC
  Administered 2024-05-10: 2 [IU] via SUBCUTANEOUS
  Administered 2024-05-12 – 2024-05-15 (×2): 3 [IU] via SUBCUTANEOUS
  Administered 2024-05-16 – 2024-05-18 (×3): 2 [IU] via SUBCUTANEOUS
  Administered 2024-05-19 – 2024-05-20 (×2): 3 [IU] via SUBCUTANEOUS
  Filled 2024-05-09: qty 2
  Filled 2024-05-09 (×4): qty 3
  Filled 2024-05-09 (×3): qty 2

## 2024-05-09 MED ORDER — BUDESONIDE 0.5 MG/2ML IN SUSP
2.0000 mL | Freq: Two times a day (BID) | RESPIRATORY_TRACT | Status: DC
  Administered 2024-05-10 – 2024-05-21 (×24): 0.5 mg via RESPIRATORY_TRACT
  Filled 2024-05-09 (×26): qty 2

## 2024-05-09 MED ORDER — DOCUSATE SODIUM 100 MG PO CAPS
100.0000 mg | ORAL_CAPSULE | Freq: Two times a day (BID) | ORAL | Status: DC
  Administered 2024-05-10 – 2024-05-21 (×23): 100 mg via ORAL
  Filled 2024-05-09 (×23): qty 1

## 2024-05-09 MED ORDER — ACETAMINOPHEN 325 MG PO TABS
650.0000 mg | ORAL_TABLET | Freq: Four times a day (QID) | ORAL | Status: DC | PRN
  Administered 2024-05-10 – 2024-05-17 (×5): 650 mg via ORAL
  Filled 2024-05-09 (×6): qty 2

## 2024-05-09 MED ORDER — OXYBUTYNIN CHLORIDE ER 10 MG PO TB24
10.0000 mg | ORAL_TABLET | Freq: Every day | ORAL | Status: DC
  Administered 2024-05-10 – 2024-05-20 (×11): 10 mg via ORAL
  Filled 2024-05-09 (×12): qty 1

## 2024-05-09 MED ORDER — DONEPEZIL HCL 10 MG PO TABS
10.0000 mg | ORAL_TABLET | Freq: Every day | ORAL | Status: DC
  Administered 2024-05-10 – 2024-05-20 (×12): 10 mg via ORAL
  Filled 2024-05-09 (×12): qty 1

## 2024-05-09 MED ORDER — SODIUM CHLORIDE 0.9% FLUSH: 3.0000 mL | INTRAVENOUS | Status: DC | PRN

## 2024-05-09 MED ORDER — MIRABEGRON ER 50 MG PO TB24
50.0000 mg | ORAL_TABLET | Freq: Every day | ORAL | Status: DC
  Administered 2024-05-10 – 2024-05-21 (×10): 50 mg via ORAL
  Filled 2024-05-09 (×13): qty 1

## 2024-05-09 MED ORDER — METOPROLOL SUCCINATE ER 25 MG PO TB24
50.0000 mg | ORAL_TABLET | Freq: Two times a day (BID) | ORAL | Status: DC
  Administered 2024-05-10 (×2): 50 mg via ORAL
  Filled 2024-05-09 (×2): qty 2

## 2024-05-09 MED ORDER — PANTOPRAZOLE SODIUM 40 MG PO TBEC
40.0000 mg | DELAYED_RELEASE_TABLET | Freq: Every day | ORAL | Status: DC
  Administered 2024-05-10 – 2024-05-11 (×2): 40 mg via ORAL
  Filled 2024-05-09 (×2): qty 1

## 2024-05-09 MED ORDER — SODIUM CHLORIDE 0.9 % IV SOLN: 250.0000 mL | INTRAVENOUS | Status: AC | PRN

## 2024-05-09 MED ORDER — INSULIN ASPART 100 UNIT/ML IJ SOLN
0.0000 [IU] | Freq: Three times a day (TID) | INTRAMUSCULAR | Status: DC
  Administered 2024-05-10 (×2): 1 [IU] via SUBCUTANEOUS
  Administered 2024-05-10: 2 [IU] via SUBCUTANEOUS
  Administered 2024-05-11 (×2): 1 [IU] via SUBCUTANEOUS
  Administered 2024-05-11 – 2024-05-12 (×2): 2 [IU] via SUBCUTANEOUS
  Administered 2024-05-12: 1 [IU] via SUBCUTANEOUS
  Administered 2024-05-12: 2 [IU] via SUBCUTANEOUS
  Administered 2024-05-13: 3 [IU] via SUBCUTANEOUS
  Administered 2024-05-13 (×2): 2 [IU] via SUBCUTANEOUS
  Administered 2024-05-14 (×2): 3 [IU] via SUBCUTANEOUS
  Administered 2024-05-15: 5 [IU] via SUBCUTANEOUS
  Administered 2024-05-15: 1 [IU] via SUBCUTANEOUS
  Administered 2024-05-15: 3 [IU] via SUBCUTANEOUS
  Administered 2024-05-16 (×2): 1 [IU] via SUBCUTANEOUS
  Administered 2024-05-16: 2 [IU] via SUBCUTANEOUS
  Administered 2024-05-17: 1 [IU] via SUBCUTANEOUS
  Administered 2024-05-17: 4 [IU] via SUBCUTANEOUS
  Administered 2024-05-17 – 2024-05-18 (×2): 2 [IU] via SUBCUTANEOUS
  Administered 2024-05-18: 1 [IU] via SUBCUTANEOUS
  Administered 2024-05-18 – 2024-05-19 (×2): 2 [IU] via SUBCUTANEOUS
  Administered 2024-05-19 (×2): 1 [IU] via SUBCUTANEOUS
  Administered 2024-05-20: 3 [IU] via SUBCUTANEOUS
  Administered 2024-05-20 – 2024-05-21 (×3): 2 [IU] via SUBCUTANEOUS
  Filled 2024-05-09: qty 2
  Filled 2024-05-09 (×4): qty 1
  Filled 2024-05-09: qty 2
  Filled 2024-05-09: qty 1
  Filled 2024-05-09 (×2): qty 2
  Filled 2024-05-09: qty 1
  Filled 2024-05-09: qty 2
  Filled 2024-05-09: qty 5
  Filled 2024-05-09: qty 3
  Filled 2024-05-09 (×2): qty 1
  Filled 2024-05-09: qty 3
  Filled 2024-05-09 (×4): qty 2
  Filled 2024-05-09: qty 3
  Filled 2024-05-09: qty 2
  Filled 2024-05-09: qty 1
  Filled 2024-05-09: qty 2
  Filled 2024-05-09: qty 1
  Filled 2024-05-09: qty 2
  Filled 2024-05-09: qty 1
  Filled 2024-05-09: qty 2
  Filled 2024-05-09: qty 3
  Filled 2024-05-09: qty 2

## 2024-05-09 MED ORDER — ACETAMINOPHEN 650 MG RE SUPP: 650.0000 mg | Freq: Four times a day (QID) | RECTAL | Status: DC | PRN

## 2024-05-09 MED ORDER — ATORVASTATIN CALCIUM 80 MG PO TABS
80.0000 mg | ORAL_TABLET | Freq: Every day | ORAL | Status: DC
  Administered 2024-05-10 – 2024-05-20 (×12): 80 mg via ORAL
  Filled 2024-05-09: qty 1
  Filled 2024-05-09: qty 2
  Filled 2024-05-09 (×10): qty 1

## 2024-05-09 NOTE — Telephone Encounter (Signed)
   1. Has your device fired? No sure but would like to check if there's any information showing on his ICD this morning.   He said he fell out of his bed, and while on the ground his heart was pounding really hard. He felt like his heart was coming out of his chest. He denied chest pain and said he is a little short of breath, but not badly.  2. Is you device beeping?   3. Are you experiencing draining or swelling at device site?   4. Are you calling to see if we received your device transmission?   5. Have you passed out?    Please route to Device Clinic Pool

## 2024-05-09 NOTE — ED Triage Notes (Signed)
 Patient fell out of bed at 0500 and since then having sharp L sided chest pain worse with movement and also has associated SOB and palpitations. Patient on the floor approximately 30-45 mins prior to being able to get up. Small abrasion to L forehead from possibly hitting bedside table on the way down. Cards office has called him twice today advising him to come to the hospital due to heart rhythm today. He took 324 ASA prior to EMS arrival 122/50-->86/40 after one SL nitroglycerin . C collar applied by EMS due to neck pain that started 30 mins prior to EMS call this afternoon, approximately ten hours after fall. Patient on Plavix  and Xarelto .

## 2024-05-09 NOTE — H&P (Signed)
 History and Physical    Mike Hunter FMW:[id number] DOB: 09/30/1950 DOA: 05/09/2024  PCP: Nikki Hansel Atlas, MD   Patient coming from: Home   Chief Complaint:  Chief Complaint  Patient presents with   Chest Pain   ED TRIAGE note: Patient fell out of bed at 0500 and since then having sharp L sided chest pain worse with movement and also has associated SOB and palpitations. Patient on the floor approximately 30-45 mins prior to being able to get up. Small abrasion to L forehead from possibly hitting bedside table on the way down. Cards office has called him twice today advising him to come to the hospital due to heart rhythm today. He took 324 ASA prior to EMS arrival 122/50-->86/40 after one SL nitroglycerin . C collar applied by EMS due to neck pain that started 30 mins prior to EMS call this afternoon, approximately ten hours after fall. Patient on Plavix  and Xarelto .             HPI:  Mike Hunter is a 73 y.o. male with medical history significant of COPD, paroxysmal atrial fibrillation s/p pacemaker  on Xarelto , HFrEF 35 to 40%, CKD 3B, C5-C7 fusion surgery with hardware placement, chronic anemia, hyperlipidemia, urinary incontinence, anemia of chronic disease, bipolar disorder, dementia, obstructive sleep apnea on CPAP, morbid obesity, and insulin -dependent DM type II presented to emergency department with complaining of falling out of his bed at 5 AM and he stated that after that he was shocked by his pacemaker.  Patient unable to remember having loss of consciousness.  Since the fall patient is complaining about left-sided chest wall pain shortness of breath and palpitation. Patient reported that he has been for outpatient ablation next week. Currently patient denies any chest pain, shortness of breath, palpitation, headache, blurry vision, denies any dizziness, headache, seizure and loss of consciousness.  No other complaint at this time.  ED Course:  At presentation to ED  patient is hemodynamically stable. Lab work, flat troponin.  Elevated BNP 287 (baseline around 122-485) CBC showing mild leukocytosis 10.6, stable H&H and normal platelet count. BMP showing elevated blood glucose 284, elevated creatinine 1.87. Normal hepatic function panel. EKG showed atrial flutter heart rate 97.  Incomplete right bundle branch block.  Imaging, chest x-ray bibasilar atelectasis.  Cardiomegaly without overt heart failure.  CT chest abdomen pelvis no acute intrathoracic, intra-abdominal and intrapelvic finding. Patchy dependent bilateral lower lobe consolidation, favor atelectasis over airspace disease.  CT cervical spine no acute abnormality.Stable appearing Anterior fusion hardware from C5 through C7. CT head no acute intracranial abnormality.  ED physician consulted cardiology to intergrad the pacemaker.  EDP reported that patient generalized weakness.  Hospitalist consulted for further evaluation and management of AKI and  mechanical fall.  Significant labs in the ED: Lab Orders         Basic metabolic panel         CBC         Hepatic function panel         Urinalysis, Routine w reflex microscopic -Urine, Clean Catch         Brain natriuretic peptide         Creatinine, urine, random         Sodium, urine, random         CBC         Comprehensive metabolic panel         CBG monitoring, ED       Review of  Systems:  Review of Systems  Constitutional:  Negative for chills, fever, malaise/fatigue and weight loss.  Respiratory:  Negative for cough, hemoptysis, sputum production, shortness of breath and wheezing.   Cardiovascular:  Negative for chest pain, palpitations, orthopnea and leg swelling.  Gastrointestinal:  Negative for abdominal pain, heartburn, nausea and vomiting.  Musculoskeletal:  Negative for back pain, joint pain, myalgias and neck pain.  Neurological:  Negative for dizziness, seizures, loss of consciousness and headaches.   Psychiatric/Behavioral:  The patient is not nervous/anxious.     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   High cholesterol    History of methicillin resistant staphylococcus aureus (MRSA)    Hypertension    Reflux    Renal disorder    Sleep apnea     Past Surgical History:  Procedure Laterality Date   CARDIOVERSION N/A 08/26/2023   Procedure: CARDIOVERSION;  Surgeon: Barbaraann Darryle Ned, MD;  Location: [facility] INVASIVE CV LAB;  Service: Cardiovascular;  Laterality: N/A;   CARDIOVERSION N/A 02/26/2024   Procedure: CARDIOVERSION;  Surgeon: Lonni Slain, MD;  Location: [facility] INVASIVE CV LAB;  Service: Cardiovascular;  Laterality: N/A;   CARDIOVERSION N/A 03/04/2024   Procedure: CARDIOVERSION;  Surgeon: Francyne Headland, MD;  Location: [facility] INVASIVE CV LAB;  Service: Cardiovascular;  Laterality: N/A;   CATARACT EXTRACTION     CERVICAL FUSION     CHOLECYSTECTOMY     ICD IMPLANT N/A 03/01/2024   Procedure: ICD IMPLANT;  Surgeon: Kennyth Chew, MD;  Location: [facility] INVASIVE CV LAB;  Service: Cardiovascular;  Laterality: N/A;   KIDNEY STONE SURGERY     KNEE ARTHROSCOPY     TRANSESOPHAGEAL ECHOCARDIOGRAM (CATH LAB) N/A 03/04/2024   Procedure: TRANSESOPHAGEAL ECHOCARDIOGRAM;  Surgeon: Francyne Headland, MD;  Location: [facility] INVASIVE CV LAB;  Service: Cardiovascular;  Laterality: N/A;     reports that he has quit smoking. He has never used smokeless tobacco. He reports that he does not drink alcohol and does not use drugs.  Allergies  Allergen Reactions   Buprenorphine Hcl Nausea And Vomiting   Hydromorphone  Other (See Comments)    Mental Status Changes/HALLUCINATIONS   Codeine Nausea And Vomiting   Mirtazapine  Other (See Comments)    Nightmares & altered mental status    Morphine  And Codeine Nausea And Vomiting    History reviewed. No pertinent family history.  Prior to Admission medications   Medication Sig Start Date End Date Taking? Authorizing Provider  albuterol  (PROVENTIL  HFA;VENTOLIN  HFA) 108 (90 BASE) MCG/ACT inhaler Inhale 2 puffs into the lungs every 4 (four) hours as needed for wheezing or shortness of breath.    [provider]  amiodarone  (PACERONE ) 200 MG tablet Take 1 tablet (200 mg total) by mouth 2 (two) times daily. 03/09/24   Patsy Lenis, MD  atorvastatin  (LIPITOR ) 80 MG tablet Take 1 tablet (80 mg total) by mouth daily. Patient not taking: Reported on 04/06/2024 01/06/24 04/06/24  [name], [name], MD  atorvastatin  (LIPITOR ) 80 MG tablet Take 80 mg by mouth at bedtime.    [provider]  azithromycin  (ZITHROMAX ) 250 MG tablet Take 250 mg by mouth every Monday, Wednesday, and Friday. 02/23/24  [provider]  benzonatate  (TESSALON ) 200 MG capsule Take 1 capsule (200 mg total) by mouth 3 (three) times daily as needed for cough. 04/07/24   Barbarann Nest, MD  budesonide  (PULMICORT ) 0.5 MG/2ML nebulizer solution Take 2 mLs by nebulization 2 (two) times daily.    [provider]  clopidogrel  (PLAVIX ) 75 MG tablet Take 75 mg by mouth daily. 05/18/23 05/17/24  [provider]  dapagliflozin propanediol (FARXIGA) 10 MG TABS tablet Take 10 mg by mouth daily.    [provider]  donepezil  (ARICEPT ) 10 MG tablet Take 10 mg by mouth at bedtime.    [provider]  doxepin  (SINEQUAN ) 10 MG capsule Take 10 mg by mouth at bedtime.    [provider]  guaiFENesin  (MUCINEX ) 600 MG 12 hr tablet Take 1 tablet (600 mg total) by mouth 2 (two) times daily. 04/07/24   Barbarann Nest, MD  insulin  aspart (NOVOLOG  FLEXPEN) 100 UNIT/ML injection Inject 0-13 Units into the skin See admin instructions. Inject 0-13 units into the  skin three times a day with meals, PER SLIDING SCALE    [provider]  iron  polysaccharides (NIFEREX) 150 MG capsule Take 150 mg by mouth 2 (two) times daily. 02/25/24   [provider]  isosorbide  mononitrate (IMDUR ) 30 MG 24 hr tablet Take 30 mg by mouth daily.    [provider]  latanoprost  (XALATAN ) 0.005 % ophthalmic solution Place 1 drop into both eyes at bedtime.      [provider]  losartan  (COZAAR ) 25 MG tablet Take 1 tablet (25 mg total) by mouth daily. 03/09/24   Patsy Lenis, MD  memantine  (NAMENDA ) 10 MG tablet Take 10 mg by mouth 2 (two) times daily.    [provider]  metoprolol  succinate (TOPROL -XL) 50 MG 24 hr tablet Take 1 tablet (50 mg total) by mouth in the morning and at bedtime. Take with or immediately following a meal. 04/11/24   Almetta Donnice LABOR, MD  mirabegron  ER (MYRBETRIQ ) 50 MG TB24 tablet Take 50 mg by mouth daily. 02/09/24 02/03/25  [provider]  montelukast  (SINGULAIR ) 10 MG tablet Take 10 mg by mouth at bedtime.    [provider]  Multiple Vitamin (MULTIVITAMIN WITH MINERALS) TABS tablet Take 1 tablet by mouth daily with breakfast.    [provider]  Multiple Vitamins-Minerals (PRESERVISION AREDS PO) Take 1 capsule by mouth in the morning and at bedtime.    [provider]  omeprazole (PRILOSEC) 40 MG capsule Take 1 capsule by mouth at bedtime. 02/02/17   [provider]  oxybutynin  (DITROPAN -XL) 10 MG 24 hr tablet Take 10 mg by mouth at bedtime.    [provider]  sertraline  (ZOLOFT ) 100 MG tablet Take 100 mg by mouth daily.    [provider]  spironolactone  (ALDACTONE ) 25 MG tablet Take 25 mg by mouth daily.    [provider]  Teriparatide  560 MCG/2.24ML SOPN Inject 20 mcg into the skin at bedtime. 02/25/24   [provider]  torsemide  (DEMADEX ) 20 MG tablet Take 1 tablet (20 mg total) by mouth daily. 03/09/24   Patsy Lenis, MD   TRESIBA  FLEXTOUCH 100 UNIT/ML FlexTouch Pen Inject 40-46 Units into the skin See admin instructions. Inject 40 units into the skin in the morning and 46 units at bedtime    [provider]  NAPOLEON INHUB 250-50 MCG/ACT AEPB Inhale 1 puff into the lungs in the morning and at bedtime. 04/20/23   [provider]  XARELTO  15 MG TABS tablet Take 15 mg by mouth daily with supper. 03/08/24   [provider]     Physical Exam: Vitals:   05/09/24 2218 05/09/24 2300 05/09/24 2345 05/10/24 0030  BP:   112/84 108/81  Pulse:   (!) 120 (!) 105  Resp:      Temp: 97.6 F (36.4 C)     TempSrc: Oral     SpO2:   99% 98%  Weight:  125.9 kg      Physical Exam Vitals and nursing note reviewed.  Constitutional:      General: He is not in acute distress.    Appearance: He is not ill-appearing.  Cardiovascular:     Rate and Rhythm: Normal rate. Rhythm irregular.     Heart sounds: Normal heart sounds.  Pulmonary:     Effort: Pulmonary effort is normal.     Breath sounds: Normal breath sounds.  Skin:    Capillary Refill: Capillary refill takes less than 2 seconds.  Neurological:     Mental Status: He is alert and oriented to person, place, and time.  Psychiatric:        Mood and Affect: Mood normal.      Labs on Admission: I have personally reviewed following labs and imaging studies  CBC: Recent Labs  Lab 05/09/24 1715  WBC 10.6*  HGB 9.8*  HCT 34.9*  MCV 76.4*  PLT 366   Basic Metabolic Panel: Recent Labs  Lab 05/09/24 1715  NA 140  K 3.6  CL 105  CO2 23  GLUCOSE 284*  BUN 32*  CREATININE 1.87*  CALCIUM  8.0*   GFR: Estimated Creatinine Clearance: 45.5 mL/min (A) (by C-G formula based on SCr of 1.87 mg/dL (H)). Liver Function Tests: Recent Labs  Lab 05/09/24 1832  AST 25  ALT 31  ALKPHOS 85  BILITOT 0.8  PROT 6.4*  ALBUMIN  3.0*   No results for input(s): LIPASE, AMYLASE in the last 168 hours. No results for input(s): AMMONIA in the  last 168 hours. Coagulation Profile: No results for input(s): INR, PROTIME in the last 168 hours. Cardiac Enzymes: Recent Labs  Lab 05/09/24 1715 05/09/24 1832  TROPONINIHS 8 8   BNP (last 3 results) Recent Labs    08/24/23 1431 02/27/24 1103 05/09/24 1832  BNP 177.1* 222.0* 283.7*   HbA1C: No results for input(s): HGBA1C in the last 72 hours. CBG: Recent Labs  Lab 05/10/24 0036  GLUCAP 249*   Lipid Profile: No results for input(s): CHOL, HDL, LDLCALC, TRIG, CHOLHDL, LDLDIRECT in the last 72 hours. Thyroid Function Tests: No results for input(s): TSH, T4TOTAL, FREET4, T3FREE, THYROIDAB in the last 72 hours. Anemia Panel: No results for input(s): VITAMINB12, FOLATE, FERRITIN, TIBC, IRON , RETICCTPCT in the last 72 hours. Urine analysis:    Component Value Date/Time   COLORURINE YELLOW 05/09/2024 1808   APPEARANCEUR CLEAR 05/09/2024 1808   LABSPEC 1.012 05/09/2024 1808   PHURINE 5.0 05/09/2024 1808   GLUCOSEU >=500 (A) 05/09/2024 1808   HGBUR NEGATIVE 05/09/2024 1808   BILIRUBINUR NEGATIVE 05/09/2024 1808   KETONESUR NEGATIVE 05/09/2024 1808   PROTEINUR NEGATIVE 05/09/2024 1808   UROBILINOGEN 0.2 11/01/2014 1530   NITRITE NEGATIVE 05/09/2024 1808   LEUKOCYTESUR NEGATIVE 05/09/2024 1808    Radiological Exams on Admission: I have personally reviewed images CT CHEST ABDOMEN PELVIS W CONTRAST Result Date: 05/09/2024 CLINICAL DATA:  Blunt trauma EXAM: CT CHEST, ABDOMEN, AND PELVIS WITH CONTRAST TECHNIQUE: Multidetector CT imaging of the chest, abdomen and pelvis was  performed following the standard protocol during bolus administration of intravenous contrast. RADIATION DOSE REDUCTION: This exam was performed according to the departmental dose-optimization program which includes automated exposure control, adjustment of the mA and/or kV according to patient size and/or use of iterative reconstruction technique. CONTRAST:  75mL  OMNIPAQUE  IOHEXOL  350 MG/ML SOLN COMPARISON:  02/25/2024 FINDINGS: CT CHEST FINDINGS Cardiovascular: The heart is unremarkable without pericardial effusion. Dual lead pacer is identified. No evidence of thoracic aortic aneurysm or dissection. 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 Atherosclerosis of the aorta and coronary vasculature. Mediastinum/Nodes: No enlarged mediastinal, hilar, or axillary lymph nodes. Thyroid gland, trachea, and esophagus demonstrate no significant findings. Lungs/Pleura: There is patchy bilateral lower lobe consolidation, favor hypoventilatory changes over airspace disease. No effusion or pneumothorax. The central airways are patent. Musculoskeletal: No acute or destructive bony abnormalities. Chronic T10 compression fracture again noted. Reconstructed images demonstrate no additional findings. CT ABDOMEN PELVIS FINDINGS Hepatobiliary: No focal liver abnormality is seen. Status post cholecystectomy. No biliary dilatation. Pancreas: Unremarkable. No pancreatic ductal dilatation or surrounding inflammatory changes. Spleen: No splenic injury or perisplenic hematoma. Adrenals/Urinary Tract: Kidneys are stable. No evidence of acute renal injury. The adrenals and bladder are unremarkable. Stomach/Bowel: No bowel obstruction or ileus. No bowel wall thickening or inflammatory change. Vascular/Lymphatic: Aortic atherosclerosis. No enlarged abdominal or pelvic lymph nodes. Reproductive: Prostate is unremarkable. Other: No free fluid or free intraperitoneal gas. No abdominal wall hernia. Musculoskeletal: No acute or destructive bony abnormalities. Prominent Schmorl's nodes within the inferior endplate of L2 and superior endplate of L3. Reconstructed images demonstrate no additional findings. IMPRESSION: 1. No acute intrathoracic, intra-abdominal, or intrapelvic trauma. 2. Chronic T10 compression deformity, stable.  No acute fractures. 3. Patchy dependent  bilateral lower lobe consolidation, favor atelectasis over airspace disease. 4.  Aortic Atherosclerosis (ICD10-I70.0). Electronically Signed   By: Ozell Daring M.D.   On: 05/09/2024 21:32   CT Cervical Spine Wo Contrast Result Date: 05/09/2024 EXAM: CT CERVICAL SPINE WITHOUT CONTRAST 05/09/2024 94:73:77 PM TECHNIQUE: CT of the cervical spine was performed without the administration of intravenous contrast. Multiplanar reformatted images are provided for review. Automated exposure control, iterative reconstruction, and/or weight based adjustment of the mA/kV was utilized to reduce the radiation dose to as low as reasonably achievable. COMPARISON: Comparison is made with 01/30/2023. CLINICAL HISTORY: Neck trauma (Age >= 65y). FINDINGS: CERVICAL SPINE: BONES AND ALIGNMENT: Straightening of the cervical spine. Trace anterior listhesis C4 on C5 and C3 on C4. Anterior fusion hardware from C5 through C7 with solid bone fusion. Facet alignment is maintained. No acute fracture or traumatic malalignment. DEGENERATIVE CHANGES: Multilevel facet degenerative changes with left greater than right foraminal narrowing. SOFT TISSUES: No prevertebral soft tissue swelling. Left carotid stent is noted. IMPRESSION: 1. No acute abnormality of the cervical spine. 2. Stable appearing Anterior fusion hardware from C5 through C7 Electronically signed by: Luke Bun MD 05/09/2024 06:41 PM EST RP Workstation: [id number]   DG Chest 2 View Result Date: 05/09/2024 EXAM: 2 VIEW(S) XRAY OF THE CHEST 05/09/2024 05:35:00 PM COMPARISON: 04/05/2024 CLINICAL HISTORY: CP FINDINGS: LINES, TUBES AND DEVICES: Left subclavian approach dual lead cardiac rhythm maintenance device noted. LUNGS AND PLEURA: Patchy left basilar opacities. No pleural effusion. No pneumothorax. HEART AND MEDIASTINUM: Cardiomegaly. Aortic arch atherosclerosis. BONES AND SOFT TISSUES: Multilevel thoracic osteophytosis. ACDF hardware noted. Chronic lower thoracic spine  compression fracture. IMPRESSION: 1. Patchy left basilar opacities favored to represent atelectasis or scarring . 2. Cardiomegaly without overt failure Electronically  signed by: Luke Bun MD 05/09/2024 06:33 PM EST RP Workstation: [id number]   CT HEAD WO CONTRAST Result Date: 05/09/2024 EXAM: CT HEAD WITHOUT CONTRAST 05/09/2024 05:26:22 PM TECHNIQUE: CT of the head was performed without the administration of intravenous contrast. Automated exposure control, iterative reconstruction, and/or weight based adjustment of the mA/kV was utilized to reduce the radiation dose to as low as reasonably achievable. COMPARISON: 06/20/2023 , [place] [id number], MRI 03/03/2023 CLINICAL HISTORY: Head trauma, [place] (Age >= 65y) FINDINGS: BRAIN AND VENTRICLES: No acute hemorrhage. No evidence of acute infarct. No hydrocephalus. No extra-axial collection. No mass effect or midline shift. Cavum septum pellucidum variant noted. Similar-appearing left occipital hyperdense lesion. This measures 9 mm, series to image 14. Atherosclerotic calcifications within cavernous internal carotid and vertebral arteries. ORBITS: No acute abnormality. Bilateral lens replacement noted. SINUSES: No acute abnormality. SOFT TISSUES AND SKULL: No acute soft tissue abnormality. No skull fracture. IMPRESSION: 1. No acute intracranial abnormality. 2. 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 Electronically signed by: Luke Bun MD 05/09/2024 06:32 PM EST RP Workstation: [id number]     EKG: My personal interpretation of EKG shows: Atrial flutter rate controlled heart rate 97 and prolonged QTc interval 570 ms.    Assessment/Plan: Principal Problem:   Acute kidney injury superimposed on chronic kidney disease Active Problems:   Acute kidney injury superimposed on stage 3b chronic kidney disease (HCC)   Fall at home, initial encounter   HFrEF (heart failure with reduced ejection  fraction) (HCC)   Insulin  dependent type 2 diabetes mellitus (HCC)   Hyperlipidemia   History of COPD   History of dementia   History of fusion of cervical spine   Chronic anemia   History of bipolar disorder   Obstructive sleep apnea    Assessment and Plan: Acute kidney injury superimposed CKD stage IIIb -Patient presented to emergency department complaining of fall from his bed since then he is having left-sided chest pain abdominal pain.  Patient reported that probably his ICD has been fired once.  Unable to recall loss of consciousness.  At presentation to ED patient is hemodynamically stable. -Lab work, flat troponin.  Elevated BNP 287 (baseline around 122-485) CBC showing mild leukocytosis 10.6, stable H&H and normal platelet count. BMP showing elevated blood glucose 284, elevated creatinine 1.87. Normal hepatic function panel. EKG showed atrial flutter heart rate 97.  Incomplete right bundle branch block. - Imaging, chest x-ray bibasilar atelectasis.  Cardiomegaly without overt heart failure. - CT chest abdomen pelvis no acute intrathoracic, intra-abdominal and intrapelvic finding. Patchy dependent bilateral lower lobe consolidation, favor atelectasis over airspace disease. - CT cervical spine no acute abnormality.Stable appearing Anterior fusion hardware from C5 through C7. -CT head no acute intracranial abnormality. - ED physician consulted cardiology to intergrad the pacemaker.  EDP reported that patient generalized weakness. -At this time there is no concern for acute CHF exacerbation even though BNP elevated which is around the baseline. - Concern for prerenal acute kidney injury in the setting of polypharmacy.  Patient is also borderline hypotensive. - Holding torsemide , Jardiance and losartan  - UA unremarkable.  urine creatinine and sodium - Starting low-dose LR's 50 cc/h.  Monitor renal function, monitor urine output and-intoxication.   Mechanical fall at home -Patient  reported that he was sleeping probably ICD fired and he fell out of the bed.  Patient stated that since the fall he has some left-sided chest pain abdominal pain.  Imaging completely unremarkable.  He is  also complaining about generalized weakness. - Consulted PT and OT for evaluation and requirement of any DME.  Paroxysmal atrial fibrillation status post pacemaker placement History of paroxysmal A-fib status post pacemaker placement reported that waiting for ablation next week.  In the ED patient heart rate within good range below 100.  EKG showing atrial flutter heart rate 97 and prolonged QTc interval - BMP showed normal electrolytes. -Continue amiodarone  200 mg twice daily, Toprol -XL 50 mg twice daily and Xarelto . -ED physician consulted cardiology to request for pacemaker interrogation. -Continue cardiac monitoring for development of any arrhythmia.   HFrEF 35 to 40% -Blood pressure to good range.  Continue Toprol -XL and Imdur ..  Slightly bated BNP which is around baseline.  In the setting of AKI from dehydration and polypharmacy holding torsemide , Jardiance, losartan  and spironolactone .  Based on blood pressure trend and renal function can resume other blood pressure regimen accordingly.   Insulin -dependent DM type II -Continue Tresiba  40 units daily, sliding scale insulin  with mealtime coverage and bedtime insulin .   History of COPD -Stable.  Continue Symbicort  twice daily and Xopenex  as needed.  History of dementia -Continue memantine  and donepezil   Obstructive sleep apnea on CPAP at bedtime Continue CPAP at bedtime  History of bipolar disorder -Pending pharmacy verification of Zoloft .  History of chronic anemia -Stable H&H.  Continue oral iron  supplement   DVT prophylaxis:  Xarelto  Code Status:  Full Code Diet: Heart heart healthy and carb modified diet.  Fluid restriction 2 L/day salt restriction 2 g/day Family Communication:   Family was present at bedside, at the  time of interview. Opportunity was given to ask question and all questions were answered satisfactorily.  Disposition Plan: Continue monitor improvement of renal function.  Pending pacemaker interrogation by cardiology team. Consults: Cardiology, PT and OT Admission status:   Inpatient, Telemetry bed  Severity of Illness: The appropriate patient status for this patient is INPATIENT. Inpatient status is judged to be reasonable and necessary in order to provide the required intensity of service to ensure the patient's safety. The patient's presenting symptoms, physical exam findings, and initial radiographic and laboratory data in the context of their chronic comorbidities is felt to place them at high risk for further clinical deterioration. Furthermore, it is not anticipated that the patient will be medically stable for discharge from the hospital within 2 midnights of admission.   * I certify that at the point of admission it is my clinical judgment that the patient will require inpatient hospital care spanning beyond 2 midnights from the point of admission due to high intensity of service, high risk for further deterioration and high frequency of surveillance required.DEWAINE    Thersea Manfredonia, MD [facility] Hospitalists  How to contact the [facility] Attending or Consulting provider 7A - 7P or covering provider during after hours 7P -7A, for this patient.  Check the care team in [facility] and look for a) attending/consulting [facility] provider listed and b) the [facility] team listed Log into [email or web address] and use [place]'s universal password to access. If you do not have the password, please contact the hospital operator. Locate the [facility] provider you are looking for under [facility] and page to a number that you can be directly reached. If you still have difficulty reaching the provider, please page the [facility] (Director on Call) for the Hospitalists listed on amion for assistance.  05/10/2024, 12:57 AM

## 2024-05-09 NOTE — ED Notes (Signed)
 CT show C-spine free of injury. Removing C-collar MD Tegeler made aware.

## 2024-05-09 NOTE — ED Notes (Signed)
 RN interroagted pacemaker at this time. Pt has Environmental Education Officer.

## 2024-05-09 NOTE — Telephone Encounter (Addendum)
 Reviewed transmission:  PRESENTING: AF/Flutter, v rates 80's - 150's. (Ongoing ATR event since 9pm last night).   Numerous ATR and NSVT events all EGMS consistent with AF/RVR, no true VT noted. V-rates 150's-180's.   AF ablation in process of being scheduled on 05/16/2024.    Patient continues to be very symptomatic:  increased weakness in legs in recent days, says he fell out of bed this morning and often feels that his heart is pounding out of his chest.  Worse in recent days.  Patient denied hitting his head this morning when he fell.  Denies any pain other than soreness to his knees,legs where he says he went down wedged between the bed.    ER precautions given if patient has severe symptoms and encouraged to go if symptoms worsen.   Forwarding to Dr. Almetta to review.   Patient says if we call back - to call his main home number:  [phone]

## 2024-05-09 NOTE — ED Notes (Signed)
 Wife Mike Hunter [phone] would like an update asap

## 2024-05-09 NOTE — ED Provider Notes (Signed)
 [place] EMERGENCY DEPARTMENT AT [facility] Provider Note   CSN: [id number] Arrival date & time: 05/09/24  1628     Patient presents with: Chest Pain   Mike Hunter is a 73 y.o. male who is brought in by EMS, seen for multiple complaints including a fall out of bed at 5 AM after he stated that he was shocked by his pacemaker.  Patient states that he briefly lost consciousness after being shocked by the pacemaker and rolling out of bed.  Patient states that he is on anticoagulants, and he is supposed to have an ablation next Monday.  Since the event, patient's been having wax/wane chest pain, and has a few abrasions on his lower extremities and head -no obvious trauma.  Patient has taken 321 mg of aspirin  and nitroglycerin  with EMS and is without pain at this time.  Patient arrived with c-collar placed by EMS.  Patient states that he was contacted by his cardiologist who stated he needed to be seen at the hospital due to the ICD firing.  Patient is currently in no acute distress.      Chest Pain      Prior to Admission medications   Medication Sig Start Date End Date Taking? Authorizing Provider  albuterol  (PROVENTIL  HFA;VENTOLIN  HFA) 108 (90 BASE) MCG/ACT inhaler Inhale 2 puffs into the lungs every 4 (four) hours as needed for wheezing or shortness of breath.    [provider]  amiodarone  (PACERONE ) 200 MG tablet Take 1 tablet (200 mg total) by mouth 2 (two) times daily. 03/09/24   Patsy Alm, MD  atorvastatin  (LIPITOR ) 80 MG tablet Take 1 tablet (80 mg total) by mouth daily. Patient not taking: Reported on 04/06/2024 01/06/24 04/06/24  [name], [name], MD  atorvastatin  (LIPITOR ) 80 MG tablet Take 80 mg by mouth at bedtime.    [provider]  azithromycin  (ZITHROMAX ) 250 MG tablet Take 250 mg by mouth every Monday, Wednesday, and Friday. 02/23/24   [provider]  benzonatate  (TESSALON ) 200 MG capsule Take 1 capsule (200 mg total) by  mouth 3 (three) times daily as needed for cough. 04/07/24   Barbarann Nest, MD  budesonide  (PULMICORT ) 0.5 MG/2ML nebulizer solution Take 2 mLs by nebulization 2 (two) times daily.    [provider]  clopidogrel  (PLAVIX ) 75 MG tablet Take 75 mg by mouth daily. 05/18/23 05/17/24  [provider]  dapagliflozin propanediol (FARXIGA) 10 MG TABS tablet Take 10 mg by mouth daily.    [provider]  donepezil  (ARICEPT ) 10 MG tablet Take 10 mg by mouth at bedtime.    [provider]  doxepin  (SINEQUAN ) 10 MG capsule Take 10 mg by mouth at bedtime.    [provider]  guaiFENesin  (MUCINEX ) 600 MG 12 hr tablet Take 1 tablet (600 mg total) by mouth 2 (two) times daily. 04/07/24   Barbarann Nest, MD  insulin  aspart (NOVOLOG  FLEXPEN) 100 UNIT/ML injection Inject 0-13 Units into the skin See admin instructions. Inject 0-13 units into the skin three times a day with meals, PER SLIDING SCALE    [provider]  iron  polysaccharides (NIFEREX) 150 MG capsule Take 150 mg by mouth 2 (two) times daily. 02/25/24   [provider]  isosorbide  mononitrate (IMDUR ) 30 MG 24 hr tablet Take 30 mg by mouth daily.    [provider]  latanoprost  (XALATAN ) 0.005 % ophthalmic solution Place 1 drop into both eyes at bedtime.      [provider]  losartan  (COZAAR ) 25 MG tablet Take 1 tablet (25 mg total) by mouth daily. 03/09/24   Patsy Lenis, MD  memantine  (NAMENDA ) 10 MG tablet Take 10 mg by mouth 2 (two) times daily.    [provider]  metoprolol  succinate (TOPROL -XL) 50 MG 24 hr tablet Take 1 tablet (50 mg total) by mouth in the morning and at bedtime. Take with or immediately following a meal. 04/11/24   Almetta Donnice LABOR, MD  mirabegron  ER (MYRBETRIQ ) 50 MG TB24 tablet Take 50 mg by mouth daily. 02/09/24 02/03/25  [provider]  montelukast  (SINGULAIR ) 10 MG tablet Take 10 mg by mouth at bedtime.    [provider]   Multiple Vitamin (MULTIVITAMIN WITH MINERALS) TABS tablet Take 1 tablet by mouth daily with breakfast.    [provider]  Multiple Vitamins-Minerals (PRESERVISION AREDS PO) Take 1 capsule by mouth in the morning and at bedtime.    [provider]  omeprazole (PRILOSEC) 40 MG capsule Take 1 capsule by mouth at bedtime. 02/02/17   [provider]  oxybutynin  (DITROPAN -XL) 10 MG 24 hr tablet Take 10 mg by mouth at bedtime.    [provider]  sertraline  (ZOLOFT ) 100 MG tablet Take 100 mg by mouth daily.    [provider]  spironolactone  (ALDACTONE ) 25 MG tablet Take 25 mg by mouth daily.    [provider]  Teriparatide  560 MCG/2.24ML SOPN Inject 20 mcg into the skin at bedtime. 02/25/24   [provider]  torsemide  (DEMADEX ) 20 MG tablet Take 1 tablet (20 mg total) by mouth daily. 03/09/24   Patsy Lenis, MD  TRESIBA  FLEXTOUCH 100 UNIT/ML FlexTouch Pen Inject 40-46 Units into the skin See admin instructions. Inject 40 units into the skin in the morning and 46 units at bedtime    [provider]  NAPOLEON INHUB 250-50 MCG/ACT AEPB Inhale 1 puff into the lungs in the morning and at bedtime. 04/20/23   [provider]  XARELTO  15 MG TABS tablet Take 15 mg by mouth daily with supper. 03/08/24   [provider]    Allergies: Buprenorphine hcl, Hydromorphone , Codeine, Mirtazapine, and Morphine  and codeine    Review of Systems  Cardiovascular:  Positive for chest pain.    Updated Vital Signs BP 114/83   Pulse 98   Temp 97.6 F (36.4 C) (Oral)   Resp 16   Wt 125.9 kg Comment: Wt from admission in 03/2024  SpO2 100%   BMI 39.83 kg/m   Physical Exam Vitals and nursing note reviewed.  Constitutional:      General: He is not in acute distress.    Appearance: Normal appearance.  HENT:     Head: Normocephalic and atraumatic.     Jaw: There is normal jaw occlusion.     Comments: Patient has a small,  superficial abrasion to the crown of his head.    Nose: Nose normal.  Eyes:     General: Vision grossly intact. Gaze aligned appropriately.     Extraocular Movements: Extraocular movements intact.     Conjunctiva/sclera: Conjunctivae normal.     Pupils: Pupils are equal, round, and reactive to light.  Neck:     Trachea: Trachea normal.  Cardiovascular:     Rate and Rhythm: Tachycardia present. Rhythm irregularly irregular.     Pulses: Normal pulses.          Radial pulses are 2+ on the right side and 2+ on the left side.  Pulmonary:  Effort: Pulmonary effort is normal. No respiratory distress.     Breath sounds: Normal breath sounds.     Comments: Patient has no difficulty speaking complete sentences. Chest:     Chest wall: No lacerations, deformity, tenderness or crepitus.  Abdominal:     General: Abdomen is flat.     Palpations: Abdomen is soft.     Tenderness: There is no abdominal tenderness. There is no right CVA tenderness, left CVA tenderness, guarding or rebound.  Musculoskeletal:        General: Normal range of motion.     Cervical back: Full passive range of motion without pain and normal range of motion. No signs of trauma. No pain with movement or spinous process tenderness. Normal range of motion.     Right lower leg: 2+ Pitting Edema present.     Left lower leg: 2+ Pitting Edema present.     Comments: Patient has a small, superficial abrasion to his right knee.  No other injuries noted on physical exam.  Skin:    General: Skin is warm and dry.     Capillary Refill: Capillary refill takes less than 2 seconds.  Neurological:     General: No focal deficit present.     Mental Status: He is alert. Mental status is at baseline.     Comments: Patient alert and oriented.  Speech clear and appropriate.  No aphasia or dysarthria.   Cranial nerves III through XII intact: Pupils equal/reactive, EOM intact, facial movement symmetric. Motor strength 5-5 in all extremities  with normal tone and no pronator drift.   Sensation intact to light touch in upper and lower extremities bilaterally.  Coordination normal with intact finger-nose. No focal neurologic deficits appreciated.   Psychiatric:        Mood and Affect: Mood normal.     (all labs ordered are listed, but only abnormal results are displayed) Labs Reviewed  BASIC METABOLIC PANEL WITH GFR - Abnormal; Notable for the following components:      Result Value   Glucose, Bld 284 (*)    BUN 32 (*)    Creatinine, Ser 1.87 (*)    Calcium  8.0 (*)    GFR, Estimated 37 (*)    All other components within normal limits  CBC - Abnormal; Notable for the following components:   WBC 10.6 (*)    Hemoglobin 9.8 (*)    HCT 34.9 (*)    MCV 76.4 (*)    MCH 21.4 (*)    MCHC 28.1 (*)    RDW 18.5 (*)    All other components within normal limits  HEPATIC FUNCTION PANEL - Abnormal; Notable for the following components:   Total Protein 6.4 (*)    Albumin  3.0 (*)    All other components within normal limits  URINALYSIS, ROUTINE W REFLEX MICROSCOPIC - Abnormal; Notable for the following components:   Glucose, UA >=500 (*)    All other components within normal limits  BRAIN NATRIURETIC PEPTIDE - Abnormal; Notable for the following components:   B Natriuretic Peptide 283.7 (*)    All other components within normal limits  CREATININE, URINE, RANDOM  SODIUM, URINE, RANDOM  CBC  COMPREHENSIVE METABOLIC PANEL WITH GFR  TROPONIN I (HIGH SENSITIVITY)  TROPONIN I (HIGH SENSITIVITY)    EKG: EKG Interpretation Date/Time:  Monday May 09 2024 16:52:43 EST Ventricular Rate:  111 PR Interval:    QRS Duration:  106 QT Interval:  396 QTC Calculation: 538 R Axis:   107  Text Interpretation: Atrial fibrillation with rapid ventricular response Rightward axis Incomplete right bundle branch block Prolonged QT Abnormal ECG When compared with ECG of 05-Apr-2024 13:17, PREVIOUS ECG IS PRESENT when compared to prior, faster  rate and new S1Q3 pattern No STEMI Confirmed by Ginger Barefoot ([id number]) on 05/09/2024 5:32:01 PM  Radiology: CT CHEST ABDOMEN PELVIS W CONTRAST Result Date: 05/09/2024 CLINICAL DATA:  Blunt trauma EXAM: CT CHEST, ABDOMEN, AND PELVIS WITH CONTRAST TECHNIQUE: Multidetector CT imaging of the chest, abdomen and pelvis was performed following the standard protocol during bolus administration of intravenous contrast. RADIATION DOSE REDUCTION: This exam was performed according to the departmental dose-optimization program which includes automated exposure control, adjustment of the mA and/or kV according to patient size and/or use of iterative reconstruction technique. CONTRAST:  75mL OMNIPAQUE  IOHEXOL  350 MG/ML SOLN COMPARISON:  02/25/2024 FINDINGS: CT CHEST FINDINGS Cardiovascular: The heart is unremarkable without pericardial effusion. Dual lead pacer is identified. No evidence of thoracic aortic aneurysm or dissection. 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 Atherosclerosis of the aorta and coronary vasculature. Mediastinum/Nodes: No enlarged mediastinal, hilar, or axillary lymph nodes. Thyroid gland, trachea, and esophagus demonstrate no significant findings. Lungs/Pleura: There is patchy bilateral lower lobe consolidation, favor hypoventilatory changes over airspace disease. No effusion or pneumothorax. The central airways are patent. Musculoskeletal: No acute or destructive bony abnormalities. Chronic T10 compression fracture again noted. Reconstructed images demonstrate no additional findings. CT ABDOMEN PELVIS FINDINGS Hepatobiliary: No focal liver abnormality is seen. Status post cholecystectomy. No biliary dilatation. Pancreas: Unremarkable. No pancreatic ductal dilatation or surrounding inflammatory changes. Spleen: No splenic injury or perisplenic hematoma. Adrenals/Urinary Tract: Kidneys are stable. No evidence of acute renal injury. The adrenals and bladder are  unremarkable. Stomach/Bowel: No bowel obstruction or ileus. No bowel wall thickening or inflammatory change. Vascular/Lymphatic: Aortic atherosclerosis. No enlarged abdominal or pelvic lymph nodes. Reproductive: Prostate is unremarkable. Other: No free fluid or free intraperitoneal gas. No abdominal wall hernia. Musculoskeletal: No acute or destructive bony abnormalities. Prominent Schmorl's nodes within the inferior endplate of L2 and superior endplate of L3. Reconstructed images demonstrate no additional findings. IMPRESSION: 1. No acute intrathoracic, intra-abdominal, or intrapelvic trauma. 2. Chronic T10 compression deformity, stable.  No acute fractures. 3. Patchy dependent bilateral lower lobe consolidation, favor atelectasis over airspace disease. 4.  Aortic Atherosclerosis (ICD10-I70.0). Electronically Signed   By: Ozell Daring M.D.   On: 05/09/2024 21:32   CT Cervical Spine Wo Contrast Result Date: 05/09/2024 EXAM: CT CERVICAL SPINE WITHOUT CONTRAST 05/09/2024 94:73:77 PM TECHNIQUE: CT of the cervical spine was performed without the administration of intravenous contrast. Multiplanar reformatted images are provided for review. Automated exposure control, iterative reconstruction, and/or weight based adjustment of the mA/kV was utilized to reduce the radiation dose to as low as reasonably achievable. COMPARISON: Comparison is made with 01/30/2023. CLINICAL HISTORY: Neck trauma (Age >= 65y). FINDINGS: CERVICAL SPINE: BONES AND ALIGNMENT: Straightening of the cervical spine. Trace anterior listhesis C4 on C5 and C3 on C4. Anterior fusion hardware from C5 through C7 with solid bone fusion. Facet alignment is maintained. No acute fracture or traumatic malalignment. DEGENERATIVE CHANGES: Multilevel facet degenerative changes with left greater than right foraminal narrowing. SOFT TISSUES: No prevertebral soft tissue swelling. Left carotid stent is noted. IMPRESSION: 1. No acute abnormality of the cervical  spine. 2. Stable appearing Anterior fusion hardware from C5 through C7 Electronically signed by: Luke Bun MD 05/09/2024 06:41 PM EST RP Workstation: [id number]   DG Chest 2 View Result  Date: 05/09/2024 EXAM: 2 VIEW(S) XRAY OF THE CHEST 05/09/2024 05:35:00 PM COMPARISON: 04/05/2024 CLINICAL HISTORY: CP FINDINGS: LINES, TUBES AND DEVICES: Left subclavian approach dual lead cardiac rhythm maintenance device noted. LUNGS AND PLEURA: Patchy left basilar opacities. No pleural effusion. No pneumothorax. HEART AND MEDIASTINUM: Cardiomegaly. Aortic arch atherosclerosis. BONES AND SOFT TISSUES: Multilevel thoracic osteophytosis. ACDF hardware noted. Chronic lower thoracic spine compression fracture. IMPRESSION: 1. Patchy left basilar opacities favored to represent atelectasis or scarring . 2. Cardiomegaly without overt failure Electronically signed by: Luke Bun MD 05/09/2024 06:33 PM EST RP Workstation: [id number]   CT HEAD WO CONTRAST Result Date: 05/09/2024 EXAM: CT HEAD WITHOUT CONTRAST 05/09/2024 05:26:22 PM TECHNIQUE: CT of the head was performed without the administration of intravenous contrast. Automated exposure control, iterative reconstruction, and/or weight based adjustment of the mA/kV was utilized to reduce the radiation dose to as low as reasonably achievable. COMPARISON: 06/20/2023 , [place] [id number], MRI 03/03/2023 CLINICAL HISTORY: Head trauma, Lupu (Age >= 65y) FINDINGS: BRAIN AND VENTRICLES: No acute hemorrhage. No evidence of acute infarct. No hydrocephalus. No extra-axial collection. No mass effect or midline shift. Cavum septum pellucidum variant noted. Similar-appearing left occipital hyperdense lesion. This measures 9 mm, series to image 14. Atherosclerotic calcifications within cavernous internal carotid and vertebral arteries. ORBITS: No acute abnormality. Bilateral lens replacement noted. SINUSES: No acute abnormality. SOFT TISSUES AND SKULL: No acute soft tissue abnormality. No skull  fracture. IMPRESSION: 1. No acute intracranial abnormality. 2. 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 Electronically signed by: Luke Bun MD 05/09/2024 06:32 PM EST RP Workstation: [id number]     Procedures   Medications Ordered in the ED  lactated ringers  infusion (has no administration in time range)  amiodarone  (PACERONE ) tablet 200 mg (has no administration in time range)  atorvastatin  (LIPITOR ) tablet 80 mg (has no administration in time range)  clopidogrel  (PLAVIX ) tablet 75 mg (has no administration in time range)  donepezil  (ARICEPT ) tablet 10 mg (has no administration in time range)  mirabegron  ER (MYRBETRIQ ) tablet 50 mg (has no administration in time range)  Rivaroxaban  (XARELTO ) tablet 15 mg (has no administration in time range)  sodium chloride  flush (NS) 0.9 % injection 3 mL (has no administration in time range)  sodium chloride  flush (NS) 0.9 % injection 3 mL (has no administration in time range)  sodium chloride  flush (NS) 0.9 % injection 3 mL (has no administration in time range)  0.9 %  sodium chloride  infusion (has no administration in time range)  acetaminophen  (TYLENOL ) tablet 650 mg (has no administration in time range)    Or  acetaminophen  (TYLENOL ) suppository 650 mg (has no administration in time range)  docusate sodium  (COLACE) capsule 100 mg (has no administration in time range)  insulin  aspart (novoLOG ) injection 0-6 Units (has no administration in time range)  budesonide  (PULMICORT ) nebulizer solution 0.5 mg (has no administration in time range)  guaiFENesin  (MUCINEX ) 12 hr tablet 600 mg (has no administration in time range)  iron  polysaccharides (NIFEREX) capsule 150 mg (has no administration in time range)  isosorbide  mononitrate (IMDUR ) 24 hr tablet 30 mg (has no administration in time range)  memantine  (NAMENDA ) tablet 10 mg (has no administration in time range)  metoprolol  succinate  (TOPROL -XL) 24 hr tablet 50 mg (has no administration in time range)  montelukast  (SINGULAIR ) tablet 10 mg (has no administration in time range)  pantoprazole  (PROTONIX ) EC tablet 40 mg (has no administration in time range)  oxybutynin  (DITROPAN -XL) 24 hr  tablet 10 mg (has no administration in time range)  insulin  degludec (TRESIBA ) 100 UNIT/ML FlexTouch Pen 40 Units (has no administration in time range)  budesonide -formoterol  (SYMBICORT ) 160-4.5 MCG/ACT inhaler 2 puff (has no administration in time range)  insulin  aspart (novoLOG ) injection 0-5 Units (has no administration in time range)  levalbuterol  (XOPENEX ) nebulizer solution 0.63 mg (has no administration in time range)  iohexol  (OMNIPAQUE ) 350 MG/ML injection 75 mL (75 mLs Intravenous Contrast Given 05/09/24 2120)    Clinical Course as of 05/09/24 2311  Mon May 09, 2024  2139 CT CHEST ABDOMEN PELVIS W CONTRAST [CT]    Clinical Course User Index [CT] Tegeler, Lonni PARAS, MD                                 Medical Decision Making Amount and/or Complexity of Data Reviewed Labs: ordered. Radiology: ordered. Decision-making details documented in ED Course.  Risk Prescription drug management.   Patient presents to the ED for multiple concerns, this involves an extensive number of treatment options, and is a complaint that carries with it a high risk of complications and morbidity.    The differential diagnosis includes: Cardiac etiology - ICD complications Traumatic injury  Co morbidities that complicate the patient evaluation: Atrial fibrillation CHF COPD Anticoagulant use  Additional history obtained: Additional history obtained from Outside Medical Records and Past Admission  External records from outside source obtained and reviewed including medical history, surgical history, medications, allergies. The patient was a reliable historian, providing a clear, detailed, and consistent account of the presenting symptoms  and relevant medical history. The information was obtained directly from the patient and statements were documented in the patient's own words when possible. No discrepancies were noted between the history provided and available collateral sources.     Lab Tests: I ordered, and personally interpreted labs.   The pertinent results include:  Troponin-8 Hepatic function panel-no acute findings BNP elevated to 283.7 Urinalysis -elevated urine glucose BMP-increase of baseline creatinine-1.87 CBC-WBC 10.6, hemoglobin 9.8  Imaging Studies ordered: I ordered imaging studies including: CT chest abdomen pelvis with contrast CT cervical spine with contrast DG chest 2 view CT head without contrast I independently visualized and interpreted imaging which showed: No acute findings I agree with the radiologist interpretation  Cardiac Monitoring: The patient was maintained on a cardiac monitor.  I personally viewed and interpreted the cardiac monitored which showed an underlying rhythm of: Atrial fibrillation RVR  Consultations Obtained:   Consultation was obtained from cardiology and the hospitalist service regarding the evaluation and management of this patient's ICD during an elevated laboratory levels, the case was discussed in detail, including relevant history, physical findings, and diagnostic results.  After report, admission was agreed upon with hospitalist-which was incorporated into the ongoing care plan.    Problem List / ED Course: Problem List: ICD firing Trauma Emergency Department Course: The patient presented with multiple complaints including his ICD firing and falling off of the bed with multiple visible bruises and history of anticoagulant use. Initial assessment included history, physical exam, and review of prior medical records.  Given patient's anticoagulation status and fall comprehensive imaging was obtained.  CT imaging of the head, abdomen, pelvis, cervical spine showed  no acute traumatic findings.  Laboratory studies were notable for a BNP of 283.7, creatinine 1.87 and hemoglobin 9.8.  Electrolytes otherwise unremarkable. Given the ICD firing, ongoing atrial fibrillation, fall while anticoagulated, and abnormal lab  findings, plan is for hospital admission for monitoring and further cardiac evaluation. Patient remained stable for throughout ED course.   Disposition: Patient to be admitted to hospitalist service for further evaluation and care.      Final diagnoses:  None    ED Discharge Orders     None          Willma Duwaine CROME, [place] 05/09/24 2348    Tegeler, Lonni PARAS, MD 05/09/24 [phone]

## 2024-05-09 NOTE — ED Notes (Signed)
 Gave patient a sand which bag

## 2024-05-09 NOTE — Telephone Encounter (Signed)
 Spoke to patient - he is sending a remote transmission after he eats breakfast.  Instructions and call back number given if he has any trouble.

## 2024-05-10 ENCOUNTER — Inpatient Hospital Stay (HOSPITAL_COMMUNITY)

## 2024-05-10 LAB — COMPREHENSIVE METABOLIC PANEL WITH GFR
ALT: 28 U/L (ref 0–44)
AST: 19 U/L (ref 15–41)
Albumin: 2.8 g/dL — ABNORMAL LOW (ref 3.5–5.0)
Alkaline Phosphatase: 81 U/L (ref 38–126)
Anion gap: 15 (ref 5–15)
BUN: 29 mg/dL — ABNORMAL HIGH (ref 8–23)
CO2: 22 mmol/L (ref 22–32)
Calcium: 8.1 mg/dL — ABNORMAL LOW (ref 8.9–10.3)
Chloride: 106 mmol/L (ref 98–111)
Creatinine, Ser: 1.76 mg/dL — ABNORMAL HIGH (ref 0.61–1.24)
GFR, Estimated: 40 mL/min — ABNORMAL LOW (ref >60)
Glucose, Bld: 249 mg/dL — ABNORMAL HIGH (ref 70–99)
Potassium: 3.3 mmol/L — ABNORMAL LOW (ref 3.5–5.1)
Sodium: 143 mmol/L (ref 135–145)
Total Bilirubin: 0.7 mg/dL (ref 0.0–1.2)
Total Protein: 6 g/dL — ABNORMAL LOW (ref 6.5–8.1)

## 2024-05-10 LAB — GLUCOSE, CAPILLARY
Glucose-Capillary: 187 mg/dL — ABNORMAL HIGH (ref 70–99)
Glucose-Capillary: 184 mg/dL — ABNORMAL HIGH (ref 70–99)

## 2024-05-10 LAB — CBC
HCT: 32.7 % — ABNORMAL LOW (ref 39.0–52.0)
Hemoglobin: 9.2 g/dL — ABNORMAL LOW (ref 13.0–17.0)
MCH: 21.7 pg — ABNORMAL LOW (ref 26.0–34.0)
MCHC: 28.1 g/dL — ABNORMAL LOW (ref 30.0–36.0)
MCV: 77.1 fL — ABNORMAL LOW (ref 80.0–100.0)
Platelets: 288 K/uL (ref 150–400)
RBC: 4.24 MIL/uL (ref 4.22–5.81)
RDW: 18.4 % — ABNORMAL HIGH (ref 11.5–15.5)
WBC: 10.3 K/uL (ref 4.0–10.5)
nRBC: 0 % (ref 0.0–0.2)

## 2024-05-10 LAB — CBG MONITORING, ED
Glucose-Capillary: 186 mg/dL — ABNORMAL HIGH (ref 70–99)
Glucose-Capillary: 203 mg/dL — ABNORMAL HIGH (ref 70–99)
Glucose-Capillary: 249 mg/dL — ABNORMAL HIGH (ref 70–99)

## 2024-05-10 MED ORDER — AMIODARONE LOAD VIA INFUSION
150.0000 mg | Freq: Once | INTRAVENOUS | Status: AC
  Administered 2024-05-10: 150 mg via INTRAVENOUS
  Filled 2024-05-10: qty 83.34

## 2024-05-10 MED ORDER — SERTRALINE HCL 100 MG PO TABS
100.0000 mg | ORAL_TABLET | Freq: Every day | ORAL | Status: DC
  Administered 2024-05-10 – 2024-05-21 (×11): 100 mg via ORAL
  Filled 2024-05-10 (×11): qty 1

## 2024-05-10 MED ORDER — AMIODARONE HCL IN DEXTROSE 360-4.14 MG/200ML-% IV SOLN
30.0000 mg/h | INTRAVENOUS | Status: DC
  Administered 2024-05-11 – 2024-05-20 (×21): 30 mg/h via INTRAVENOUS
  Filled 2024-05-10 (×20): qty 200

## 2024-05-10 MED ORDER — METOPROLOL SUCCINATE ER 50 MG PO TB24
75.0000 mg | ORAL_TABLET | Freq: Two times a day (BID) | ORAL | Status: DC
  Administered 2024-05-10 – 2024-05-11 (×2): 75 mg via ORAL
  Filled 2024-05-10 (×2): qty 1

## 2024-05-10 MED ORDER — AMIODARONE HCL IN DEXTROSE 360-4.14 MG/200ML-% IV SOLN
60.0000 mg/h | INTRAVENOUS | Status: AC
  Administered 2024-05-10 – 2024-05-11 (×2): 60 mg/h via INTRAVENOUS
  Filled 2024-05-10 (×2): qty 200

## 2024-05-10 NOTE — ED Notes (Addendum)
PT into room, at BS.  ?

## 2024-05-10 NOTE — Evaluation (Signed)
 Occupational Therapy Evaluation Patient Details Name: Mike Hunter MRN: [id number] DOB: Jun 19, 1950 Today's Date: 05/10/2024   History of Present Illness   73 y/o M adm 05/09/24 after fall out of bed with Lt chest pain and ICD shock. Pt with AKI on CKD. PMHx:  Afib, bipolar d/o, CHF, COPD, BPH, T2DM, HTN, reflux, CAD, ICD, cervical fusion and cervical xray -     Clinical Impressions Pt admitted with the above diagnosis and has the deficits outlined below. Pt would benefit from cont OT to increase independence with basic adls and adl transfers so he can return home with his wife at his prior level of function which was mod I with basic adls. Wife does all driving and all IADLS at this time.  Pt is awaiting a heart ablation and feels like his R knee is more painful than before the fall therefore no ambulation was performed during this eval. Pt did come to standing with min assist and increased pain with walker.  If pt does not have any injury to the knee and can ambulate short distances, feel pt could d/c home with wife and HHOT.  Will continue to follow to see if status of knee changes and work on supervision with adls as a goal.     If plan is discharge home, recommend the following:   A little help with walking and/or transfers;A little help with bathing/dressing/bathroom;Assistance with cooking/housework;Assist for transportation;Help with stairs or ramp for entrance     Functional Status Assessment   Patient has had a recent decline in their functional status and demonstrates the ability to make significant improvements in function in a reasonable and predictable amount of time.     Equipment Recommendations   None recommended by OT     Recommendations for Other Services         Precautions/Restrictions   Precautions Precautions: Fall Recall of Precautions/Restrictions: Intact Restrictions Weight Bearing Restrictions Per Provider Order: No     Mobility Bed  Mobility Overal bed mobility: Needs Assistance Bed Mobility: Supine to Sit, Sit to Supine     Supine to sit: Mod assist Sit to supine: Contact guard assist   General bed mobility comments: assist to get to full sitting position. Feel pt would be able to manage in bed better due body habitus    Transfers Overall transfer level: Needs assistance Equipment used: Rolling walker (2 wheels) Transfers: Sit to/from Stand Sit to Stand: Min assist           General transfer comment: cues for hand placement      Balance Overall balance assessment: Needs assistance Sitting-balance support: Feet supported Sitting balance-Leahy Scale: Good     Standing balance support: During functional activity, Reliant on assistive device for balance, Bilateral upper extremity supported Standing balance-Leahy Scale: Poor Standing balance comment: pt  must have assistive device to stand                           ADL either performed or assessed with clinical judgement   ADL Overall ADL's : Needs assistance/impaired Eating/Feeding: Set up;Sitting   Grooming: Wash/dry hands;Wash/dry face;Oral care;Set up;Sitting   Upper Body Bathing: Set up;Sitting   Lower Body Bathing: Moderate assistance;Sit to/from stand Lower Body Bathing Details (indicate cue type and reason): limited by gurney Upper Body Dressing : Minimal assistance;Sitting   Lower Body Dressing: Moderate assistance;Sit to/from stand;Cueing for compensatory techniques   Toilet Transfer: Minimal assistance;Stand-pivot;BSC/3in1;Rolling walker (2 wheels) Toilet  Transfer Details (indicate cue type and reason): limited by R knee pain Toileting- Clothing Manipulation and Hygiene: Moderate assistance;Sit to/from stand Toileting - Clothing Manipulation Details (indicate cue type and reason): pt wet on arrival. Stood to clean bed and self.     Functional mobility during ADLs: Minimal assistance;Rolling walker (2 wheels) General ADL  Comments: Limited by pain in BUEs and R knee     Vision Baseline Vision/History: 1 Wears glasses Ability to See in Adequate Light: 0 Adequate Patient Visual Report: No change from baseline Vision Assessment?: No apparent visual deficits     Perception Perception: Within Functional Limits       Praxis Praxis: Not tested       Pertinent Vitals/Pain Pain Assessment Pain Assessment: Faces Faces Pain Scale: Hurts little more Pain Location: R knee, L shoulder, r arm Pain Descriptors / Indicators: Aching, Sore, Grimacing, Guarding Pain Intervention(s): Monitored during session, Repositioned, Limited activity within patient's tolerance, Patient requesting pain meds-RN notified     Extremity/Trunk Assessment Upper Extremity Assessment Upper Extremity Assessment: Right hand dominant;RUE deficits/detail;LUE deficits/detail RUE Deficits / Details: PROM limited in shoulder to 90 degrees due to pain. othreweise WFL. Strength:  shoulder 2/5, biceps/triceps 3+/5, hand 3/5 RUE: Unable to fully assess due to pain RUE Sensation: decreased light touch RUE Coordination: decreased fine motor;decreased gross motor LUE Deficits / Details: AROM WFL Strength shoulder 3+/5, biceps/trieps 4/5, grip 4/5 LUE Sensation: WNL LUE Coordination: WNL   Lower Extremity Assessment Lower Extremity Assessment: Defer to PT evaluation   Cervical / Trunk Assessment Cervical / Trunk Assessment: Neck Surgery   Communication Communication Communication: Impaired Factors Affecting Communication: Hearing impaired   Cognition Arousal: Alert Behavior During Therapy: WFL for tasks assessed/performed Cognition: History of cognitive impairments             OT - Cognition Comments: pt at baseline with STM deficits                 Following commands: Intact       Cueing  General Comments      Pt limited by chronic pain in BUES, new pain in R knee, and SOB wtih activity which is normal for him per  report.   Exercises     Shoulder Instructions      Home Living Family/patient expects to be discharged to:: Private residence Living Arrangements: Spouse/significant other Available Help at Discharge: Family;Available 24 hours/day;Other (Comment) (wife has had 3 strokes) Type of Home: House Home Access: Stairs to enter [employer] of Steps: 3 Entrance Stairs-Rails: Right Home Layout: One level     Bathroom Shower/Tub: Producer, Television/film/video: Handicapped height Bathroom Accessibility: Yes   Home Equipment: [employer]ant (2 wheels);Shower seat;Grab bars - tub/shower;Grab bars - toilet;Other (comment);Rollator (4 wheels)   Additional Comments: wears 3 L O2 at night , CPAP at night but if gets short of breath during day will put it on.      Prior Functioning/Environment Prior Level of Function : Independent/Modified Independent;History of Falls (last six months)             Mobility Comments: MODI with use of RW or rollator at all times ADLs Comments: Pt is Mod I for ADLs, supervision for bathing as a precaution. Wife prepares all meals and cleans. Some help from daughter. Wife drives. Pt does not    OT Problem List: Decreased strength;Decreased range of motion;Decreased activity tolerance;Impaired balance (sitting and/or standing);Decreased coordination;Decreased cognition;Decreased knowledge of use of DME or AE;Impaired  sensation;Obesity;Impaired UE functional use;Pain   OT Treatment/Interventions: Self-care/ADL training;DME and/or AE instruction;Therapeutic activities;Balance training      OT Goals(Current goals can be found in the care plan section)   Acute Rehab OT Goals Patient Stated Goal: to go home by thursday OT Goal Formulation: With patient Time For Goal Achievement: 05/24/24 Potential to Achieve Goals: Good ADL Goals Pt Will Perform Grooming: with supervision;standing Pt Will Perform Lower Body Bathing: with supervision;sit to/from  stand Pt Will Perform Lower Body Dressing: with supervision;sit to/from stand Pt Will Perform Tub/Shower Transfer: Shower transfer;with supervision;grab bars;shower seat;rolling walker;ambulating Additional ADL Goal #1: Pt will walk to bathroom w walker and complete all toileting with supervision   OT Frequency:  Min 2X/week    Co-evaluation              AM-PAC OT 6 Clicks Daily Activity     Outcome Measure Help from another person eating meals?: A Little Help from another person taking care of personal grooming?: A Little Help from another person toileting, which includes using toliet, bedpan, or urinal?: A Lot Help from another person bathing (including washing, rinsing, drying)?: A Lot Help from another person to put on and taking off regular upper body clothing?: A Little Help from another person to put on and taking off regular lower body clothing?: A Lot 6 Click Score: 15   End of Session Equipment Utilized During Treatment: Rolling walker (2 wheels);Oxygen Nurse Communication: Mobility status;Other (comment) (new pain R knee since fall)  Activity Tolerance: Patient limited by fatigue;No increased pain Patient left: in bed;with call bell/phone within reach  OT Visit Diagnosis: Unsteadiness on feet (R26.81);Other abnormalities of gait and mobility (R26.89);Repeated falls (R29.6);Pain Pain - Right/Left: Right Pain - part of body: Knee;Arm                Time: 9174-9147 OT Time Calculation (min): 27 min Charges:  OT General Charges $OT Visit: 1 Visit OT Evaluation $OT Eval Moderate Complexity: 1 Mod OT Treatments $Self Care/Home Management : 8-22 mins  Joshua Silvano Dragon 05/10/2024, 10:51 AM

## 2024-05-10 NOTE — ED Notes (Signed)
 OT at bedside.

## 2024-05-10 NOTE — Progress Notes (Signed)
 Pt's HR still in the 140's-150's sustained. Amiodarone  drip started per Dr. Franky.

## 2024-05-10 NOTE — Progress Notes (Signed)
  Made aware of pts admission via [facility] alert and Dr. Almetta.   By history, pt reports he was having a nightmare and next thing he knew he was on the floor.  He felt like something jolted him out of bed.   Device interrogation previously reviewed by device clinic came in ~ 1030. Falling out of bed happened prior to 0600.   No device therapy.   Rate control variable.   Pt took farxiga yesterday and missed dose of Xarelto  yesterday.   Will not be able to get anasthesia tomorrow with Farxiga dose 11/24.   Will add TEE to procedure 12/1, cath lab and Dr. Almetta aware.   Increase toprol  to 75 mg BID to try and control rate better leading up to ablation.   No further cardiac work up planned this admission.   Mike Jodie Passey, PA-C  05/10/2024 9:48 AM

## 2024-05-10 NOTE — Progress Notes (Signed)
 Progress Note   Patient: Mike Hunter FMW:[id number] DOB: 1951-02-27 DOA: 05/09/2024     1 DOS: the patient was seen and examined on 05/10/2024   Brief hospital course: 73 y.o. male with medical history significant of COPD, paroxysmal atrial fibrillation s/p pacemaker  on Xarelto , HFrEF 35 to 40%, CKD 3B, C5-C7 fusion surgery with hardware placement, chronic anemia, hyperlipidemia, urinary incontinence, anemia of chronic disease, bipolar disorder, dementia, obstructive sleep apnea on CPAP, morbid obesity, and insulin -dependent DM type II presented to emergency department with complaining of falling out of his bed and ICD firing.  Imaging, chest x-ray bibasilar atelectasis.  Cardiomegaly without overt heart failure.   CT chest abdomen pelvis no acute intrathoracic, intra-abdominal and intrapelvic finding. Patchy dependent bilateral lower lobe consolidation, favor atelectasis over airspace disease.   CT cervical spine no acute abnormality.Stable appearing Anterior fusion hardware from C5 through C7. CT head no acute intracranial abnormality. Seen by cardiology and no cardiac testing needed He is scheduled for cardiac ablation on 12/1 as an outpatient. Lives at home with his wife.  Assessment and Plan:  Acute kidney injury superimposed CKD stage IIIb  - CT chest abdomen pelvis no acute intrathoracic, intra-abdominal and intrapelvic finding.  - Holding torsemide , Jardiance and losartan  - UA unremarkable.  urine creatinine and sodium - Received gentle IVF. Stopped later. -f/u BMP in am.     Mechanical fall at home/Physical deconditioning,POA:. - Consulted PT and OT for evaluation and requirement of any DME.   Paroxysmal atrial fibrillation status post pacemaker placement History of paroxysmal A-fib status post pacemaker placement    -Continue amiodarone  200 mg twice daily, Toprol -XL 75 mg twice daily and Xarelto  15 mg daily. -Cardiology on board. -Plan for cardiac ablation to be done  on 12/1 -Continue cardiac monitoring for development of any arrhythmia.     HFrEF,POA: chronic, NYHA III, EF 35 to 40% Continue Toprol -XL and Imdur .  In the setting of AKI from dehydration and polypharmacy holding torsemide , Jardiance, losartan  and spironolactone .  Restart once appropriate     Insulin -dependent DM type II -Continue Tresiba  40 units daily, sliding scale insulin  with mealtime coverage and bedtime insulin .     History of COPD -Stable.  Continue Symbicort  twice daily and Xopenex  as needed.   History of dementia -Continue memantine  and donepezil    Obstructive sleep apnea on CPAP at bedtime Continue CPAP at bedtime  Hyperlipidemia,POA: Continue with atorvastatin  80 mg daily.   History of bipolar disorder/depression -Continue zoloft  100 mg PO Daily.   History of chronic anemia -Stable H&H.  Continue oral iron  supplement   Disposition: Lives at home with his wife.     Subjective: Denies shortness of breath but has slight chest discomfort. He was seen by cardiology this morning. He is scheduled for cardiac ablation on 12/1. He said that he has been cardioverted multiple times in the past and wants to get rid of this afib permanently. He does h/o significant depression and has attempted suicide multiple times in the past but has no active suicidal or homicidal ideations.  Physical Exam: Vitals:   05/10/24 1140 05/10/24 1255 05/10/24 1330 05/10/24 1412  BP:   (!) 117/92 115/79  Pulse: (!) 105  (!) 103 (!) 101  Resp: 19  19 15   Temp:    97.9 F (36.6 C)  TempSrc:    Oral  SpO2: 100% 100% 99% 99%  Weight:       Constitutional: NAD, calm, comfortable Eyes: PERRL, lids and conjunctivae normal ENMT: Mucous membranes  are moist. Posterior pharynx clear of any exudate or lesions.Normal dentition.  Neck: normal, supple, no masses, no thyromegaly Respiratory: clear to auscultation bilaterally, no wheezing, no crackles. Normal respiratory effort. No accessory muscle  use.  Cardiovascular: Regular rate and rhythm, no murmurs / rubs / gallops. No extremity edema. 2+ pedal pulses. No carotid bruits.  Abdomen: no tenderness, no masses palpated. No hepatosplenomegaly. Bowel sounds positive.  Musculoskeletal: no clubbing / cyanosis. No joint deformity upper and lower extremities. Good ROM, no contractures. Normal muscle tone.  Skin: no rashes, lesions, ulcers. No induration Neurologic: CN 2-12 grossly intact. Sensation intact, DTR normal. Strength 5/5 x all 4 extremities.   Data Reviewed:  There are no new results to review at this time.  Family Communication: Wife, [place] on the phone  Disposition: Status is: Inpatient Remains inpatient appropriate because: AKI, Fall, chest discomfort  Planned Discharge Destination: Home    Time spent: 40 minutes  Author: Deliliah Room, MD 05/10/2024 2:32 PM  For on call review [email or web address].

## 2024-05-10 NOTE — Evaluation (Signed)
 Physical Therapy Evaluation Patient Details Name: Mike Hunter MRN: [id number] DOB: 10-05-1950 Today's Date: 05/10/2024  History of Present Illness  73 y/o M adm 05/09/24 after fall out of bed with Lt chest pain and ICD shock. Pt with AKI on CKD. PMHx:  Afib, bipolar d/o, CHF, COPD, BPH, T2DM, HTN, reflux, CAD, ICD, cervical fusion  Clinical Impression  Pt familiar from prior admission and reports using O2 all the time at home. Pt remained >92% on RA the entire session including gait. Pt lives with wife, active with HHPT per his report and uses RW or rollator at all times. Pt with decreased transfers, function and mobility who will benefit from acute therapy to maximize safety and independence.        If plan is discharge home, recommend the following: A little help with walking and/or transfers;Assist for transportation;Help with stairs or ramp for entrance   Can travel by private vehicle        Equipment Recommendations None recommended by PT  Recommendations for Other Services       Functional Status Assessment Patient has had a recent decline in their functional status and/or demonstrates limited ability to make significant improvements in function in a reasonable and predictable amount of time     Precautions / Restrictions Precautions Precautions: Fall Recall of Precautions/Restrictions: Intact      Mobility  Bed Mobility Overal bed mobility: Needs Assistance Bed Mobility: Supine to Sit, Sit to Supine     Supine to sit: Mod assist, HOB elevated Sit to supine: Contact guard assist   General bed mobility comments: HOB elevated grossly 25 degrees on strecher, mod assist to pivot to EOB on stretcher, pt able to return to surface without assist    Transfers Overall transfer level: Needs assistance   Transfers: Sit to/from Stand Sit to Stand: Contact guard assist           General transfer comment: cues for hand placement    Ambulation/Gait Ambulation/Gait  assistance: Contact guard assist Gait Distance (Feet): 100 Feet Assistive device: Rolling walker (2 wheels) Gait Pattern/deviations: Step-through pattern, Decreased stride length   Gait velocity interpretation: <1.8 ft/sec, indicate of risk for recurrent falls   General Gait Details: pt with cues for proximity to RW and direction. SPO2 >92% on RA throughout. Pt limited by Rt knee pain with gait  Stairs            Wheelchair Mobility     Tilt Bed    Modified Rankin (Stroke Patients Only)       Balance Overall balance assessment: History of Falls, Needs assistance Sitting-balance support: Feet supported, No upper extremity supported Sitting balance-Leahy Scale: Good     Standing balance support: During functional activity, Reliant on assistive device for balance, Bilateral upper extremity supported Standing balance-Leahy Scale: Poor Standing balance comment: RW in standing                             Pertinent Vitals/Pain Pain Assessment Faces Pain Scale: Hurts little more Pain Location: Rt knee Pain Descriptors / Indicators: Aching, Sore Pain Intervention(s): Limited activity within patient's tolerance, Monitored during session    Home Living Family/patient expects to be discharged to:: Private residence Living Arrangements: Spouse/significant other Available Help at Discharge: Family;Available 24 hours/day Type of Home: House Home Access: Stairs to enter   [employer] of Steps: 3   Home Layout: One level Home Equipment: [employer]ant (2 wheels);Shower seat;Grab  bars - tub/shower;Grab bars - toilet;Rollator (4 wheels) Additional Comments: wears 3 L O2 at night , CPAP at night but if gets short of breath during day will put it on.    Prior Function Prior Level of Function : Independent/Modified Independent;History of Falls (last six months)             Mobility Comments: MODI with use of RW or rollator at all times ADLs Comments:  Pt is Mod I for ADLs, supervision for bathing as a precaution. Wife prepares all meals and cleans. Some help from daughter. Wife drives. Pt does not     Extremity/Trunk Assessment   Upper Extremity Assessment Upper Extremity Assessment: Defer to OT evaluation    Lower Extremity Assessment Lower Extremity Assessment: Generalized weakness    Cervical / Trunk Assessment Cervical / Trunk Assessment: Normal  Communication   Communication Communication: Impaired Factors Affecting Communication: Hearing impaired    Cognition Arousal: Alert Behavior During Therapy: WFL for tasks assessed/performed   PT - Cognitive impairments: Problem solving                         Following commands: Intact       Cueing Cueing Techniques: Verbal cues     General Comments      Exercises     Assessment/Plan    PT Assessment Patient needs continued PT services  PT Problem List Decreased strength;Decreased activity tolerance;Decreased balance;Decreased knowledge of use of DME;Pain       PT Treatment Interventions DME instruction;Gait training;Stair training;Functional mobility training;Therapeutic activities;Patient/family education;Balance training;Therapeutic exercise    PT Goals (Current goals can be found in the Care Plan section)  Acute Rehab PT Goals Patient Stated Goal: return home, get my ablation next week PT Goal Formulation: With patient Time For Goal Achievement: 05/24/24 Potential to Achieve Goals: Fair    Frequency Min 2X/week     Co-evaluation               AM-PAC PT 6 Clicks Mobility  Outcome Measure Help needed turning from your back to your side while in a flat bed without using bedrails?: A Little Help needed moving from lying on your back to sitting on the side of a flat bed without using bedrails?: A Lot Help needed moving to and from a bed to a chair (including a wheelchair)?: A Little Help needed standing up from a chair using your arms  (e.g., wheelchair or bedside chair)?: A Little Help needed to walk in hospital room?: A Little Help needed climbing 3-5 steps with a railing? : A Little 6 Click Score: 17    End of Session Equipment Utilized During Treatment: Gait belt Activity Tolerance: Patient tolerated treatment well Patient left: in bed;with call bell/phone within reach Nurse Communication: Mobility status PT Visit Diagnosis: Other abnormalities of gait and mobility (R26.89)    Time: 1131-1150 PT Time Calculation (min) (ACUTE ONLY): 19 min   Charges:   PT Evaluation $PT Eval Moderate Complexity: 1 Mod   PT General Charges $$ ACUTE PT VISIT: 1 Visit         Lenoard SQUIBB, PT Acute Rehabilitation Services Office: [phone]   Lenoard NOVAK Elira Colasanti 05/10/2024, 1:02 PM

## 2024-05-10 NOTE — ED Notes (Signed)
 Walking [employer] with walker with PT, steady on feet.

## 2024-05-10 NOTE — Significant Event (Signed)
 Patient's nurse notified me that patient was complaining of shortness of breath and monitor showing tachycardia.  On exam at bedside not in distress.  Blood pressure 130/60 pulse 140/min and A-fib RVR.  Chest x-ray shows some congestion.  Patient was already given his p.o. metoprolol  and amiodarone  an hour ago.  Patient is still tachycardic.  In A-fib with RVR I discussed with on-call cardiologist Dr. Gail plan is to start patient on amiodarone  infusion and hold p.o. amiodarone .  Will check basic labs including TSH metabolic panel CBC and troponin.  Redia Cleaver MD.

## 2024-05-10 NOTE — Progress Notes (Signed)
   05/10/24 2035  Assess: MEWS Score  Temp 99.9 F (37.7 C)  BP 133/64  MAP (mmHg) 85  Pulse Rate (!) 129  ECG Heart Rate (!) 131  Resp (!) 22  Level of Consciousness Alert  SpO2 92 %  O2 Device Room Air  Assess: MEWS Score  MEWS Temp 0  MEWS Systolic 0  MEWS Pulse 3  MEWS RR 1  MEWS LOC 0  MEWS Score 4  MEWS Score Color Red  Assess: if the MEWS score is Yellow or Red  Were vital signs accurate and taken at a resting state? Yes  Does the patient meet 2 or more of the SIRS criteria? No  MEWS guidelines implemented  Yes, red  Treat  MEWS Interventions Considered administering scheduled or prn medications/treatments as ordered  Take Vital Signs  Increase Vital Sign Frequency  Red: Q1hr x2, continue Q4hrs until patient remains green for 12hrs  Escalate  MEWS: Escalate Red: Discuss with charge nurse and notify provider. Consider notifying RRT. If remains red for 2 hours consider need for higher level of care  Notify: Charge Nurse/RN  Name of Charge Nurse/RN Notified Matt RN  Provider Notification  Provider Name/Title Dr. Franky  Date Provider Notified 05/10/24  Time Provider Notified 2045  Method of Notification  (secure chat)  Notification Reason Change in status (HR in the 130's-150's, Afib with rvr (red MEWS))  Provider response Other (Comment) (EKG)  Date of Provider Response 05/10/24  Time of Provider Response 2050  Notify: Rapid Response  Name of Rapid Response RN Notified Lindbergh RN  Date Rapid Response Notified 05/10/24  Time Rapid Response Notified 2045  Assess: SIRS CRITERIA  SIRS Temperature  0  SIRS Respirations  1  SIRS Pulse 1  SIRS WBC 0  SIRS Score Sum  2

## 2024-05-10 NOTE — Progress Notes (Signed)
 PT Cancellation Note  Patient Details Name: Mike Hunter MRN: [id number] DOB: 29-Jun-1950   Cancelled Treatment:    Reason Eval/Treat Not Completed: Other (comment) (pt with Rt knee pain, await MD decision for imaging of Rt knee)   Kyren Knick B Aquilla Shambley 05/10/2024, 9:15 AM Lenoard SQUIBB, PT Acute Rehabilitation Services Office: [phone]

## 2024-05-11 DIAGNOSIS — N179 Acute kidney failure, unspecified: Secondary | ICD-10-CM | POA: Diagnosis not present

## 2024-05-11 DIAGNOSIS — I4891 Unspecified atrial fibrillation: Secondary | ICD-10-CM | POA: Diagnosis not present

## 2024-05-11 DIAGNOSIS — N189 Chronic kidney disease, unspecified: Secondary | ICD-10-CM | POA: Diagnosis not present

## 2024-05-11 LAB — URINALYSIS, ROUTINE W REFLEX MICROSCOPIC
Bacteria, UA: NONE SEEN
Bilirubin Urine: NEGATIVE
Glucose, UA: >=500 mg/dL — AB
Hgb urine dipstick: NEGATIVE
Ketones, ur: NEGATIVE mg/dL
Leukocytes,Ua: NEGATIVE
Nitrite: NEGATIVE
Protein, ur: NEGATIVE mg/dL
Specific Gravity, Urine: 1.021 (ref 1.005–1.030)
pH: 6 (ref 5.0–8.0)

## 2024-05-11 LAB — BRAIN NATRIURETIC PEPTIDE: B Natriuretic Peptide: 555.5 pg/mL — ABNORMAL HIGH (ref 0.0–100.0)

## 2024-05-11 LAB — BASIC METABOLIC PANEL WITH GFR
Anion gap: 10 (ref 5–15)
BUN: 19 mg/dL (ref 8–23)
CO2: 25 mmol/L (ref 22–32)
Calcium: 8.6 mg/dL — ABNORMAL LOW (ref 8.9–10.3)
Chloride: 105 mmol/L (ref 98–111)
Creatinine, Ser: 1.57 mg/dL — ABNORMAL HIGH (ref 0.61–1.24)
GFR, Estimated: 46 mL/min — ABNORMAL LOW (ref >60)
Glucose, Bld: 208 mg/dL — ABNORMAL HIGH (ref 70–99)
Potassium: 3.9 mmol/L (ref 3.5–5.1)
Sodium: 140 mmol/L (ref 135–145)

## 2024-05-11 LAB — GLUCOSE, CAPILLARY
Glucose-Capillary: 206 mg/dL — ABNORMAL HIGH (ref 70–99)
Glucose-Capillary: 205 mg/dL — ABNORMAL HIGH (ref 70–99)
Glucose-Capillary: 178 mg/dL — ABNORMAL HIGH (ref 70–99)
Glucose-Capillary: 167 mg/dL — ABNORMAL HIGH (ref 70–99)
Glucose-Capillary: 178 mg/dL — ABNORMAL HIGH (ref 70–99)

## 2024-05-11 LAB — CBC WITH DIFFERENTIAL/PLATELET
Abs Immature Granulocytes: 0.1 K/uL — ABNORMAL HIGH (ref 0.00–0.07)
Basophils Absolute: 0.1 K/uL (ref 0.0–0.1)
Basophils Relative: 1 %
Eosinophils Absolute: 0.1 K/uL (ref 0.0–0.5)
Eosinophils Relative: 1 %
HCT: 34.3 % — ABNORMAL LOW (ref 39.0–52.0)
Hemoglobin: 9.9 g/dL — ABNORMAL LOW (ref 13.0–17.0)
Immature Granulocytes: 1 %
Lymphocytes Relative: 5 %
Lymphs Abs: 0.8 K/uL (ref 0.7–4.0)
MCH: 21.6 pg — ABNORMAL LOW (ref 26.0–34.0)
MCHC: 28.9 g/dL — ABNORMAL LOW (ref 30.0–36.0)
MCV: 74.7 fL — ABNORMAL LOW (ref 80.0–100.0)
Monocytes Absolute: 1.1 K/uL — ABNORMAL HIGH (ref 0.1–1.0)
Monocytes Relative: 7 %
Neutro Abs: 14 K/uL — ABNORMAL HIGH (ref 1.7–7.7)
Neutrophils Relative %: 85 %
Platelets: 358 K/uL (ref 150–400)
RBC: 4.59 MIL/uL (ref 4.22–5.81)
RDW: 18.1 % — ABNORMAL HIGH (ref 11.5–15.5)
WBC: 16.2 K/uL — ABNORMAL HIGH (ref 4.0–10.5)
nRBC: 0 % (ref 0.0–0.2)

## 2024-05-11 LAB — LACTIC ACID, PLASMA
Lactic Acid, Venous: 1.6 mmol/L (ref 0.5–1.9)
Lactic Acid, Venous: 2.5 mmol/L (ref 0.5–1.9)
Lactic Acid, Venous: 1.1 mmol/L (ref 0.5–1.9)
Lactic Acid, Venous: 0.8 mmol/L (ref 0.5–1.9)

## 2024-05-11 LAB — TROPONIN I (HIGH SENSITIVITY)
Troponin I (High Sensitivity): 11 ng/L (ref <18)
Troponin I (High Sensitivity): 10 ng/L (ref <18)
Troponin I (High Sensitivity): 9 ng/L (ref <18)

## 2024-05-11 LAB — MAGNESIUM: Magnesium: 2.5 mg/dL — ABNORMAL HIGH (ref 1.7–2.4)

## 2024-05-11 LAB — TSH: TSH: 1.534 u[IU]/mL (ref 0.350–4.500)

## 2024-05-11 MED ORDER — SODIUM CHLORIDE 0.9 % IV SOLN
3.0000 g | Freq: Four times a day (QID) | INTRAVENOUS | Status: DC
  Administered 2024-05-11 – 2024-05-21 (×39): 3 g via INTRAVENOUS
  Filled 2024-05-11 (×40): qty 8

## 2024-05-11 MED ORDER — METOPROLOL TARTRATE 25 MG PO TABS
25.0000 mg | ORAL_TABLET | Freq: Once | ORAL | Status: AC
  Administered 2024-05-11: 25 mg via ORAL
  Filled 2024-05-11: qty 1

## 2024-05-11 MED ORDER — VANCOMYCIN HCL 10 G IV SOLR
2500.0000 mg | Freq: Once | INTRAVENOUS | Status: AC
  Administered 2024-05-11: 2500 mg via INTRAVENOUS
  Filled 2024-05-11: qty 2500

## 2024-05-11 MED ORDER — VANCOMYCIN HCL 500 MG/100ML IV SOLN
500.0000 mg | Freq: Two times a day (BID) | INTRAVENOUS | Status: DC
Start: 2024-05-12 — End: 2024-05-12
  Filled 2024-05-11 (×2): qty 100

## 2024-05-11 MED ORDER — METOPROLOL SUCCINATE ER 100 MG PO TB24
100.0000 mg | ORAL_TABLET | Freq: Two times a day (BID) | ORAL | Status: DC
  Administered 2024-05-11 – 2024-05-21 (×19): 100 mg via ORAL
  Filled 2024-05-11 (×19): qty 1

## 2024-05-11 MED ORDER — PANTOPRAZOLE SODIUM 40 MG IV SOLR
40.0000 mg | Freq: Two times a day (BID) | INTRAVENOUS | Status: DC
  Administered 2024-05-11 – 2024-05-21 (×19): 40 mg via INTRAVENOUS
  Filled 2024-05-11 (×19): qty 10

## 2024-05-11 MED ORDER — RIVAROXABAN 20 MG PO TABS
20.0000 mg | ORAL_TABLET | Freq: Every day | ORAL | Status: DC
  Administered 2024-05-11 – 2024-05-12 (×2): 20 mg via ORAL
  Filled 2024-05-11 (×2): qty 1

## 2024-05-11 MED ORDER — LACTATED RINGERS IV BOLUS
500.0000 mL | Freq: Once | INTRAVENOUS | Status: AC
  Administered 2024-05-11: 500 mL via INTRAVENOUS

## 2024-05-11 MED ORDER — TRAMADOL HCL 50 MG PO TABS
25.0000 mg | ORAL_TABLET | Freq: Four times a day (QID) | ORAL | Status: DC | PRN
  Administered 2024-05-11 – 2024-05-18 (×8): 25 mg via ORAL
  Filled 2024-05-11 (×8): qty 1

## 2024-05-11 NOTE — Progress Notes (Signed)
 OT Cancellation Note  Patient Details Name: Mike Hunter MRN: [id number] DOB: 07-15-1950   Cancelled Treatment:    Reason Eval/Treat Not Completed: Medical issues which prohibited therapy (Patient's HR 120-130 resting in bed  OT to hold)  Lamarr JONETTA Pouch 05/11/2024, 3:25 PM

## 2024-05-11 NOTE — Progress Notes (Signed)
 Mobility Specialist Progress Note:    05/11/24 1102  Mobility  Activity Turned to back - supine (Ankle Pump, Leg Ext, Leg Lift)  Level of Assistance Standby assist, set-up cues, supervision of patient - no hands on  Assistive Device None  Range of Motion/Exercises Active  Activity Response Tolerated fair;RN notified;Other (Comment) (Started to have chest pain)  Mobility Referral Yes  Mobility visit 1 Mobility  Mobility Specialist Start Time (ACUTE ONLY) 1102  Mobility Specialist Stop Time (ACUTE ONLY) 1129  Mobility Specialist Time Calculation (min) (ACUTE ONLY) 27 min   Received pt laying in bed agreeable to session. In beginning, no c/o any symptoms but throughout session some quad soreness and chest pains. Pt w/ high anxiety at the moment. Pt able to perform movements on own w/ slight pain. Left pt in bed w/ all needs met. RN in room.  Venetia Keel Mobility Specialist Please Neurosurgeon or Rehab Office at [phone]

## 2024-05-11 NOTE — Evaluation (Signed)
 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   High cholesterol    History of methicillin resistant staphylococcus aureus (MRSA)    Hypertension    Reflux    Renal disorder    Sleep apnea    Past Surgical History:  Past Surgical History:  Procedure Laterality Date   CARDIOVERSION N/A 08/26/2023   Procedure: CARDIOVERSION;  Surgeon: Barbaraann Darryle Ned, MD;  Location: [facility] INVASIVE CV LAB;  Service: Cardiovascular;  Laterality: N/A;   CARDIOVERSION N/A 02/26/2024   Procedure: CARDIOVERSION;  Surgeon: Lonni Slain, MD;  Location: [facility] INVASIVE CV LAB;  Service: Cardiovascular;  Laterality: N/A;   CARDIOVERSION N/A 03/04/2024   Procedure: CARDIOVERSION;  Surgeon: Francyne Headland, MD;  Location: [facility] INVASIVE CV LAB;  Service: Cardiovascular;  Laterality: N/A;   CATARACT EXTRACTION     CERVICAL FUSION     CHOLECYSTECTOMY     ICD IMPLANT N/A 03/01/2024   Procedure: ICD IMPLANT;  Surgeon: Kennyth Chew, MD;  Location: [facility] INVASIVE CV LAB;  Service: Cardiovascular;  Laterality: N/A;   KIDNEY STONE SURGERY     KNEE ARTHROSCOPY     TRANSESOPHAGEAL ECHOCARDIOGRAM (CATH LAB) N/A 03/04/2024   Procedure: TRANSESOPHAGEAL ECHOCARDIOGRAM;  Surgeon: Francyne Headland, MD;  Location: [facility] INVASIVE CV LAB;  Service: Cardiovascular;  Laterality: N/A;   HPI:  73 yo male presenting to ED after falling out of bed and ICD firing. Admitted with AKI  superimposed on CKD and A-fib with RVR, initially scheduled for an OP ablation 12/1 (cardiology following). CT Chest shows patchy dependent bilateral lower lobe consolidation, favor atelectasis over airspace disease. Most recent SLP visit July 2025 with functional-appearing oropharyngeal swallow clinically and recommendations to f/u for an esophagram. FEES 06/04/23 also WFL. PMH: COPD, paroxysmal A-fib s/p pacemaker on Xarelto , HF reduced EF 35-40%, CKD 3B, C5-C7 fusion surgery with hardware placement, HLD, dementia, T2DM    Assessment / Plan / Recommendation  Clinical Impression  Resume regular diet and thin liquids. Could consider dedicated esophageal assessment given pt's consistent reports of uncomfortable globus sensation. Discussed with MD and will sign off.   Pt exhibits functional mastication and oral clearance with regular solids despite missing some lower dentition. Consecutive sips of water were observed without signs clinically concerning for aspiration. He endorses persistent globus sensation with all POs. Discussed that the symptoms he describes sound more consistent with an esophageal component as he does not exhibit s/s of oropharyngeal dysphagia. This appears overall similar to recent admission in July when further esophageal assessment was recommended but never completed. He independently demonstrates carryover of previously recommended strategies including thorough mastication, following solids with liquids, and sitting upright ~30 minutes after meals.   SLP Visit Diagnosis: Dysphagia, unspecified (R13.10)    Aspiration Risk  Mild aspiration risk    Diet Recommendation Regular;Thin liquid    Liquid Administration via: Cup;Straw Medication Administration: Whole meds with puree Supervision: Patient  able to self feed;Intermittent supervision to cue for compensatory strategies Compensations: Minimize environmental distractions;Slow rate;Small sips/bites Postural Changes: Seated  upright at 90 degrees;Remain upright for at least 30 minutes after po intake    Other  Recommendations Recommended Consults: Consider esophageal assessment Oral Care Recommendations: Oral care BID     Assistance Recommended at Discharge    Functional Status Assessment Patient has not had a recent decline in their functional status  Frequency and Duration            Prognosis Prognosis for improved oropharyngeal function: Good Barriers to Reach Goals: Cognitive deficits;Time post onset      Swallow Study   General HPI: 73 yo male presenting to ED after falling out of bed and ICD firing. Admitted with AKI superimposed on CKD and A-fib with RVR, initially scheduled for an OP ablation 12/1 (cardiology following). CT Chest shows patchy dependent bilateral lower lobe consolidation, favor atelectasis over airspace disease. Most recent SLP visit July 2025 with functional-appearing oropharyngeal swallow clinically and recommendations to f/u for an esophagram. FEES 06/04/23 also WFL. PMH: COPD, paroxysmal A-fib s/p pacemaker on Xarelto , HF reduced EF 35-40%, CKD 3B, C5-C7 fusion surgery with hardware placement, HLD, dementia, T2DM Type of Study: Bedside Swallow Evaluation Previous Swallow Assessment: see HPI Diet Prior to this Study: NPO Temperature Spikes Noted: Yes Respiratory Status: Nasal cannula History of Recent Intubation: No Behavior/Cognition: Alert;Cooperative Oral Cavity Assessment: Within Functional Limits Oral Care Completed by SLP: No Oral Cavity - Dentition: Missing dentition Vision: Functional for self-feeding Self-Feeding Abilities: Able to feed self Patient Positioning: Upright in bed Baseline Vocal Quality: Normal Volitional Cough: Strong Volitional Swallow: Able to elicit    Oral/Motor/Sensory Function Overall Oral Motor/Sensory Function: Within functional limits   Ice Chips Ice chips: Not tested   Thin Liquid Thin Liquid: Within functional limits Presentation:  Straw;Self Fed    Nectar Thick Nectar Thick Liquid: Not tested   Honey Thick Honey Thick Liquid: Not tested   Puree Puree: Not tested   Solid     Solid: Within functional limits Presentation: Self Fed      Damien Blumenthal, M.A., CCC-SLP Speech Language Pathology, Acute Rehabilitation Services  Secure Chat preferred [phone]  05/11/2024,1:33 PM

## 2024-05-11 NOTE — Progress Notes (Signed)
  Paged for pt complaining of Chest pain.   Reported 8/10 and pain meds requested.   On my assessment pt resting comfortably in bed , states he was just about to take a nap. Has chest discomfort that is worse with deep breathing and with palpitation. Also reports he has been coughing.   HS troponin WNL, very re-assuring.   Will start with tylenol .  RN to see if IM willing to given anxiolytics.   HRs in 120-130s currently. Will give lopressor  dose and increase toprol .   Ozell Jodie Passey, PA-C  05/11/2024 3:13 PM

## 2024-05-11 NOTE — Consult Note (Signed)
 ELECTROPHYSIOLOGY CONSULT NOTE    Patient ID: FRANDY BASNETT MRN: [id number], DOB/AGE: 75-Dec-1952 75 y.o.  Admit date: 05/09/2024 Date of Consult: 05/11/2024  Primary Physician: [name], [name], MD Primary Cardiologist: Jerel Balding, MD  Electrophysiologist: Dr. Almetta   Referring Provider: Dr. Nichole  Patient Profile: Davidson Palmieri Citro is a 73 y.o. male with a history of CAD s/p LAD PCI (10/2023), HFrEF/ICM, sustained VT s/p 2' prevention LEFT BSCI DC ICD (DOI 03/01/24), PVD s/p L ICA (05/2023), HTN, HLD, COPD, OSA on CPAP, neuropathy, DM2, BPD, CKD3, and HPpEF who is being seen today for the evaluation of AF/AFL RVR at the request of Dr. Madelyne.  HPI:  Zafar Debrosse Armel is a 73 y.o. male well known to EP team.   Currently scheduled for AF/AFL ablation 05/16/2024.  Presented 11/24 with fall. EP asked to review device interrogation. Felt overall stable rhythm wise. Pt reported he jolted awake out of bed and fell into floor. CT and Knee Xray without acute finding. HRs 90-110s.   Had planned to keep ablation scheduled for 12/1. TEE added in setting of missing Xarelto  evening of 11/24. Toprol  increased.  Pt observed overnight by medicine team. Noted to flip into AFL with rates in 130-140s. Started on IV amiodarone . Tmax 100.1 with new complaints of nausea, malaise, and lack of appetite. CBC 10.3 -> 16.2.  EP asked to formally see given AF with RVR.   Pt feeling very poorly currently, tearful about it being the holidays and having to stay admitted. States he feels very nauseated with no appetite. He asks if he has COVID, though no specific sick contacts. He is very anxious to get ablation over with.    Labs Potassium3.9 (11/25 2324) Magnesium   2.5* (11/25 2324) Creatinine, ser  1.57* (11/25 2324) PLT  358 (11/25 2324) HGB  9.9* (11/25 2324) WBC 16.2* (11/25 2324) Troponin I (High Sensitivity)11 (11/25 2324).    Past Medical History:  Diagnosis Date   Asthma    Atrial  fibrillation (HCC)    Bipolar affective (HCC)    Cataract    CHF (congestive heart failure) (HCC)    Conversion disorder    COPD (chronic obstructive pulmonary disease) (HCC)    Cornea disorder    Diabetes mellitus    Gallstones    born without a gallbladder   High cholesterol    History of methicillin resistant staphylococcus aureus (MRSA)    Hypertension    Reflux    Renal disorder    Sleep apnea      Surgical History:  Past Surgical History:  Procedure Laterality Date   CARDIOVERSION N/A 08/26/2023   Procedure: CARDIOVERSION;  Surgeon: Barbaraann Darryle Ned, MD;  Location: [facility] INVASIVE CV LAB;  Service: Cardiovascular;  Laterality: N/A;   CARDIOVERSION N/A 02/26/2024   Procedure: CARDIOVERSION;  Surgeon: Lonni Slain, MD;  Location: [facility] INVASIVE CV LAB;  Service: Cardiovascular;  Laterality: N/A;   CARDIOVERSION N/A 03/04/2024   Procedure: CARDIOVERSION;  Surgeon: Balding Jerel, MD;  Location: [facility] INVASIVE CV LAB;  Service: Cardiovascular;  Laterality: N/A;   CATARACT EXTRACTION     CERVICAL FUSION     CHOLECYSTECTOMY     ICD IMPLANT N/A 03/01/2024   Procedure: ICD IMPLANT;  Surgeon: Kennyth Chew, MD;  Location: [facility] INVASIVE CV LAB;  Service: Cardiovascular;  Laterality: N/A;   KIDNEY STONE SURGERY     KNEE ARTHROSCOPY     TRANSESOPHAGEAL ECHOCARDIOGRAM (CATH LAB) N/A 03/04/2024   Procedure: TRANSESOPHAGEAL ECHOCARDIOGRAM;  Surgeon: Balding Jerel,  MD;  Location: [facility] INVASIVE CV LAB;  Service: Cardiovascular;  Laterality: N/A;     Medications Prior to Admission  Medication Sig Dispense Refill Last Dose/Taking   acetaminophen  (TYLENOL ) 650 MG CR tablet Take 1,300 mg by mouth every 8 (eight) hours as needed for pain.   05/08/2024   albuterol  (PROVENTIL  HFA;VENTOLIN  HFA) 108 (90 BASE) MCG/ACT inhaler Inhale 2 puffs into the lungs every 4 (four) hours as needed for wheezing or shortness of breath.   05/09/2024 Morning   amiodarone  (PACERONE ) 200 MG tablet Take 1 tablet  (200 mg total) by mouth 2 (two) times daily. 60 tablet 3 05/09/2024 Morning   atorvastatin  (LIPITOR ) 80 MG tablet Take 80 mg by mouth at bedtime.   05/08/2024   azithromycin  (ZITHROMAX ) 250 MG tablet Take 250 mg by mouth every Monday, Wednesday, and Friday.   05/09/2024 Morning   budesonide  (PULMICORT ) 0.5 MG/2ML nebulizer solution Take 2 mLs by nebulization 2 (two) times daily.   05/09/2024 Morning   clopidogrel  (PLAVIX ) 75 MG tablet Take 75 mg by mouth daily.   05/09/2024 at  9:00 AM   dapagliflozin propanediol (FARXIGA) 10 MG TABS tablet Take 10 mg by mouth daily.   05/09/2024 Morning   donepezil  (ARICEPT ) 10 MG tablet Take 10 mg by mouth at bedtime.   05/08/2024   doxepin  (SINEQUAN ) 10 MG capsule Take 10-20 mg by mouth at bedtime.   05/08/2024   insulin  aspart (NOVOLOG  FLEXPEN) 100 UNIT/ML injection Inject 0-13 Units into the skin See admin instructions. Inject 0-13 units into the skin three times a day with meals, PER SLIDING SCALE   05/09/2024 Morning   iron  polysaccharides (NIFEREX) 150 MG capsule Take 150 mg by mouth 2 (two) times daily.   05/09/2024 Morning   isosorbide  mononitrate (IMDUR ) 30 MG 24 hr tablet Take 30 mg by mouth daily.   05/09/2024 Morning   latanoprost  (XALATAN ) 0.005 % ophthalmic solution Place 1 drop into both eyes at bedtime.     05/08/2024   lidocaine  4 % Place 1 patch onto the skin daily. Apply to lower back.   Taking   losartan  (COZAAR ) 25 MG tablet Take 1 tablet (25 mg total) by mouth daily. 90 tablet 2 05/09/2024 Morning   memantine  (NAMENDA ) 10 MG tablet Take 10 mg by mouth 2 (two) times daily.   05/09/2024 Morning   metoprolol  succinate (TOPROL -XL) 50 MG 24 hr tablet Take 1 tablet (50 mg total) by mouth in the morning and at bedtime. Take with or immediately following a meal. 180 tablet 3 05/09/2024 at  9:00 AM   mirabegron  ER (MYRBETRIQ ) 50 MG TB24 tablet Take 50 mg by mouth daily.   05/09/2024 Morning   montelukast  (SINGULAIR ) 10 MG tablet Take 10 mg by mouth at  bedtime.   05/08/2024   Multiple Vitamin (MULTIVITAMIN WITH MINERALS) TABS tablet Take 1 tablet by mouth daily with breakfast.   05/09/2024 Morning   Multiple Vitamins-Minerals (PRESERVISION AREDS PO) Take 2 capsules by mouth in the morning.   05/09/2024 Morning   omeprazole (PRILOSEC) 40 MG capsule Take 1 capsule by mouth daily after supper.   05/08/2024   oxybutynin  (DITROPAN -XL) 10 MG 24 hr tablet Take 10 mg by mouth daily.   05/09/2024 Morning   OXYGEN Inhale 3 L into the lungs daily as needed (Shortness of breath, wheezing).   05/09/2024   sertraline  (ZOLOFT ) 100 MG tablet Take 100 mg by mouth daily.   05/09/2024 Morning   spironolactone  (ALDACTONE ) 25 MG tablet Take 25 mg  by mouth daily.   05/09/2024 Morning   Teriparatide  560 MCG/2.24ML SOPN Inject 20 mcg into the skin at bedtime.   05/08/2024   torsemide  (DEMADEX ) 20 MG tablet Take 1 tablet (20 mg total) by mouth daily. (Patient taking differently: Take 20 mg by mouth 2 (two) times daily at 10 AM and 5 PM.)   05/09/2024 Morning   TRESIBA  FLEXTOUCH 100 UNIT/ML FlexTouch Pen Inject 40-46 Units into the skin See admin instructions. Inject 40 units into the skin in the morning and 46 units at bedtime   05/09/2024 Morning   WIXELA INHUB 250-50 MCG/ACT AEPB Inhale 2 puffs into the lungs in the morning.   05/09/2024 Morning   XARELTO  15 MG TABS tablet Take 15 mg by mouth daily with supper.   Taking   benzonatate  (TESSALON ) 200 MG capsule Take 1 capsule (200 mg total) by mouth 3 (three) times daily as needed for cough. (Patient not taking: Reported on 05/10/2024) 20 capsule 0 Not Taking    Inpatient Medications:   atorvastatin   80 mg Oral QHS   budesonide   2 mL Nebulization BID   budesonide -formoterol   2 puff Inhalation BID   clopidogrel   75 mg Oral Daily   docusate sodium   100 mg Oral BID   donepezil   10 mg Oral QHS   guaiFENesin   600 mg Oral BID   insulin  aspart  0-5 Units Subcutaneous QHS   insulin  aspart  0-6 Units Subcutaneous TID WC    insulin  glargine-yfgn  40 Units Subcutaneous q morning   iron  polysaccharides  150 mg Oral BID   isosorbide  mononitrate  30 mg Oral Daily   memantine   10 mg Oral BID   metoprolol  succinate  75 mg Oral BID   mirabegron  ER  50 mg Oral Daily   montelukast   10 mg Oral QHS   oxybutynin   10 mg Oral QHS   pantoprazole   40 mg Oral Daily   Rivaroxaban   15 mg Oral Q supper   sertraline   100 mg Oral Daily   sodium chloride  flush  3 mL Intravenous Q12H   sodium chloride  flush  3 mL Intravenous Q12H    Allergies:  Allergies  Allergen Reactions   Buprenorphine Hcl Nausea And Vomiting   Hydromorphone  Other (See Comments)    Mental Status Changes/HALLUCINATIONS   Mirtazapine Other (See Comments)    Nightmares & altered mental status    Codeine Nausea And Vomiting   Morphine  And Codeine Nausea And Vomiting    History reviewed. No pertinent family history.   Physical Exam: Vitals:   05/11/24 0500 05/11/24 0700 05/11/24 0713 05/11/24 0855  BP: 107/76 112/82  112/82  Pulse:  96  (!) 108  Resp: 19 (!) 21    Temp: 98.7 F (37.1 C) 98 F (36.7 C)  98.1 F (36.7 C)  TempSrc: Oral Oral  Oral  SpO2: 98% 98% 98% 99%  Weight:        GEN- NAD, A&O x 3, Tearful and anxious affect HEENT: Normocephalic, atraumatic Lungs- CTAB, Normal effort.  Heart- Irregularly irregular rate and rhythm, No M/G/R.  GI- Soft, NT, ND.  Extremities- No clubbing, cyanosis, or edema   Radiology/Studies: DG Chest Port 1 View Result Date: 05/10/2024 EXAM: 1 VIEW(S) XRAY OF THE CHEST 05/10/2024 09:56:00 PM COMPARISON: 05/09/2024 CLINICAL HISTORY: SOB (shortness of breath) FINDINGS: LINES, TUBES AND DEVICES: Cardiac pacemaker. LUNGS AND PLEURA: Mild pulmonary vascular congestion. No edema or consolidation. No focal pulmonary opacity. No pleural effusion. No pneumothorax. HEART AND MEDIASTINUM: Cardiac  enlargement. Calcification of the aorta. BONES AND SOFT TISSUES: Postoperative changes in the cervical spine.  LIMITATIONS/ARTIFACTS: Shallow inspiration. IMPRESSION: 1. Cardiomegaly with mild pulmonary vascular congestion, without edema or consolidation. Electronically signed by: Elsie Gravely MD 05/10/2024 10:06 PM EST RP Workstation: [id number]   DG Knee 1-2 Views Right Result Date: 05/10/2024 CLINICAL DATA:  Fall.  Right knee pain. EXAM: RIGHT KNEE - 2 VIEW COMPARISON:  None Available. FINDINGS: Prior knee arthroplasty with all 3 components in expected position. No No evidence of acute fracture or dislocation. Small to moderate knee joint effusion is seen. No other bone lesions identified. IMPRESSION: No acute fracture or dislocation. Small to moderate knee joint effusion. Prior knee arthroplasty. Electronically Signed   By: Norleen DELENA Kil M.D.   On: 05/10/2024 10:46   CT CHEST ABDOMEN PELVIS W CONTRAST Result Date: 05/09/2024 CLINICAL DATA:  Blunt trauma EXAM: CT CHEST, ABDOMEN, AND PELVIS WITH CONTRAST TECHNIQUE: Multidetector CT imaging of the chest, abdomen and pelvis was performed following the standard protocol during bolus administration of intravenous contrast. RADIATION DOSE REDUCTION: This exam was performed according to the departmental dose-optimization program which includes automated exposure control, adjustment of the mA and/or kV according to patient size and/or use of iterative reconstruction technique. CONTRAST:  75mL OMNIPAQUE  IOHEXOL  350 MG/ML SOLN COMPARISON:  02/25/2024 FINDINGS: CT CHEST FINDINGS Cardiovascular: The heart is unremarkable without pericardial effusion. Dual lead pacer is identified. No evidence of thoracic aortic aneurysm or dissection. 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 Atherosclerosis of the aorta and coronary vasculature. Mediastinum/Nodes: No enlarged mediastinal, hilar, or axillary lymph nodes. Thyroid gland, trachea, and esophagus demonstrate no significant findings. Lungs/Pleura: There is patchy bilateral lower lobe  consolidation, favor hypoventilatory changes over airspace disease. No effusion or pneumothorax. The central airways are patent. Musculoskeletal: No acute or destructive bony abnormalities. Chronic T10 compression fracture again noted. Reconstructed images demonstrate no additional findings. CT ABDOMEN PELVIS FINDINGS Hepatobiliary: No focal liver abnormality is seen. Status post cholecystectomy. No biliary dilatation. Pancreas: Unremarkable. No pancreatic ductal dilatation or surrounding inflammatory changes. Spleen: No splenic injury or perisplenic hematoma. Adrenals/Urinary Tract: Kidneys are stable. No evidence of acute renal injury. The adrenals and bladder are unremarkable. Stomach/Bowel: No bowel obstruction or ileus. No bowel wall thickening or inflammatory change. Vascular/Lymphatic: Aortic atherosclerosis. No enlarged abdominal or pelvic lymph nodes. Reproductive: Prostate is unremarkable. Other: No free fluid or free intraperitoneal gas. No abdominal wall hernia. Musculoskeletal: No acute or destructive bony abnormalities. Prominent Schmorl's nodes within the inferior endplate of L2 and superior endplate of L3. Reconstructed images demonstrate no additional findings. IMPRESSION: 1. No acute intrathoracic, intra-abdominal, or intrapelvic trauma. 2. Chronic T10 compression deformity, stable.  No acute fractures. 3. Patchy dependent bilateral lower lobe consolidation, favor atelectasis over airspace disease. 4.  Aortic Atherosclerosis (ICD10-I70.0). Electronically Signed   By: Ozell Daring M.D.   On: 05/09/2024 21:32   CT Cervical Spine Wo Contrast Result Date: 05/09/2024 EXAM: CT CERVICAL SPINE WITHOUT CONTRAST 05/09/2024 94:73:77 PM TECHNIQUE: CT of the cervical spine was performed without the administration of intravenous contrast. Multiplanar reformatted images are provided for review. Automated exposure control, iterative reconstruction, and/or weight based adjustment of the mA/kV was utilized to  reduce the radiation dose to as low as reasonably achievable. COMPARISON: Comparison is made with 01/30/2023. CLINICAL HISTORY: Neck trauma (Age >= 65y). FINDINGS: CERVICAL SPINE: BONES AND ALIGNMENT: Straightening of the cervical spine. Trace anterior listhesis C4 on C5 and C3 on C4. Anterior fusion hardware from  C5 through C7 with solid bone fusion. Facet alignment is maintained. No acute fracture or traumatic malalignment. DEGENERATIVE CHANGES: Multilevel facet degenerative changes with left greater than right foraminal narrowing. SOFT TISSUES: No prevertebral soft tissue swelling. Left carotid stent is noted. IMPRESSION: 1. No acute abnormality of the cervical spine. 2. Stable appearing Anterior fusion hardware from C5 through C7 Electronically signed by: Luke Bun MD 05/09/2024 06:41 PM EST RP Workstation: [id number]   DG Chest 2 View Result Date: 05/09/2024 EXAM: 2 VIEW(S) XRAY OF THE CHEST 05/09/2024 05:35:00 PM COMPARISON: 04/05/2024 CLINICAL HISTORY: CP FINDINGS: LINES, TUBES AND DEVICES: Left subclavian approach dual lead cardiac rhythm maintenance device noted. LUNGS AND PLEURA: Patchy left basilar opacities. No pleural effusion. No pneumothorax. HEART AND MEDIASTINUM: Cardiomegaly. Aortic arch atherosclerosis. BONES AND SOFT TISSUES: Multilevel thoracic osteophytosis. ACDF hardware noted. Chronic lower thoracic spine compression fracture. IMPRESSION: 1. Patchy left basilar opacities favored to represent atelectasis or scarring . 2. Cardiomegaly without overt failure Electronically signed by: Luke Bun MD 05/09/2024 06:33 PM EST RP Workstation: [id number]   CT HEAD WO CONTRAST Result Date: 05/09/2024 EXAM: CT HEAD WITHOUT CONTRAST 05/09/2024 05:26:22 PM TECHNIQUE: CT of the head was performed without the administration of intravenous contrast. Automated exposure control, iterative reconstruction, and/or weight based adjustment of the mA/kV was utilized to reduce the radiation dose to as low  as reasonably achievable. COMPARISON: 06/20/2023 , head [place] [id number], MRI 03/03/2023 CLINICAL HISTORY: Head trauma, Bracy (Age >= 65y) FINDINGS: BRAIN AND VENTRICLES: No acute hemorrhage. No evidence of acute infarct. No hydrocephalus. No extra-axial collection. No mass effect or midline shift. Cavum septum pellucidum variant noted. Similar-appearing left occipital hyperdense lesion. This measures 9 mm, series to image 14. Atherosclerotic calcifications within cavernous internal carotid and vertebral arteries. ORBITS: No acute abnormality. Bilateral lens replacement noted. SINUSES: No acute abnormality. SOFT TISSUES AND SKULL: No acute soft tissue abnormality. No skull fracture. IMPRESSION: 1. No acute intracranial abnormality. 2. 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 Electronically signed by: Luke Bun MD 05/09/2024 06:32 PM EST RP Workstation: [id number]   CUP PACEART REMOTE DEVICE CHECK Result Date: 04/13/2024 ICD: Scheduled remote reviewed. Normal device function.  Presenting rhythm: AFL/VP Persistent AFL since 04/09/2024, Xarelto  per EPIC EMR, V-rates controlled, AFL ablation scheduled for 05/16/2024 per EPIC notes Next remote transmission per protocol. ML, CVRS   EKG:on arrival showed AFL with RVR ~ 111 bpm (personally reviewed)  TELEMETRY: AF/AFL with rates 100-140s (personally reviewed)  DEVICE HISTORY: [employer] Sci Dual Chamber ICD implanted 03/01/2024 for VT and CHF / Secondary prevention  Assessment/Plan:  AF with RVR AFL with RVR Continue IV amiodarone  for now Continue Xarelto  - missed dose 11/24, will need TEE if ablation occurs prior to 3-4 weeks.  Continue toprol  75 mg BID, can titrate up as tolerated.  Had initially felt not related to admission, though confounded by general malaise when rates are up.  With elevated CBC, low grade temp, and Nausea despite relatively improve rates, ? Underlying process exacerbating  his AF rates.   Nausea/Lack of appetite Malaise Elevated WBC Per primary. ? Underlying process Tmax 100.1  Dr. Almetta to see and further consider timing of ablation. May need to resolve potential underlying process.   For questions or updates, please contact [facility] Please consult [email or web address] for contact info under     Signed, Ozell Prentice Passey, PA-C  05/11/2024, 9:34 AM

## 2024-05-11 NOTE — TOC Initial Note (Addendum)
 Transition of Care [facility]) - Initial/Assessment Note    Patient Details  Name: Mike Hunter MRN: [id number] Date of Birth: 05-Aug-1950  [facility] CM/SW Contact:    Roxie KANDICE Stain, RN Phone Number: 05/11/2024, 4:09 PM  Clinical Narrative:                 Spoke to patient's wife regarding transition needs,  Patient lives with wife and has walker and rollator.  Patient is active with [facility] home health PT. [facility] with [facility] is aware of admission. .  Address, Phone number and PCP verified.   Need home health PT orders.   Expected Discharge Plan: Home w Home Health Services Barriers to Discharge: Continued Medical Work up   Patient Goals and CMS Choice Patient states their goals for this hospitalization and ongoing recovery are:: return home CMS Medicare.gov Compare Post Acute Care list provided to:: Patient Choice offered to / list presented to : Patient      Expected Discharge Plan and Services   Discharge Planning Services: CM Consult Post Acute Care Choice: Home Health Living arrangements for the past 2 months: Single Family Home                           HH Arranged: PT HH Agency: [facility] Date [facility] Agency Contacted: 05/11/24 Time HH Agency Contacted: [id number] Representative spoke with at [facility] Agency: Arna  Prior Living Arrangements/Services Living arrangements for the past 2 months: Single Family Home Lives with:: Spouse Patient language and need for interpreter reviewed:: Yes Do you feel safe going back to the place where you live?: Yes      Need for Family Participation in Patient Care: Yes (Comment) Care giver support system in place?: Yes (comment) Current home services: DME (walker, rollator) Criminal Activity/Legal Involvement Pertinent to Current Situation/Hospitalization: No - Comment as needed  Activities of Daily Living   ADL Screening (condition at time of admission) Independently performs ADLs?: Yes (appropriate for  developmental age) Is the patient deaf or have difficulty hearing?: No Does the patient have difficulty seeing, even when wearing glasses/contacts?: No Does the patient have difficulty concentrating, remembering, or making decisions?: No  Permission Sought/Granted         Permission granted to share info w AGENCY: [facility]        Emotional Assessment   Attitude/Demeanor/Rapport: Gracious Affect (typically observed): Accepting Orientation: : Oriented to Situation, Oriented to  Time, Oriented to Place, Oriented to Self Alcohol / Substance Use: Not Applicable Psych Involvement: No (comment)  Admission diagnosis:  Fall, initial encounter [W19.XXXA] Acute kidney injury superimposed on chronic kidney disease [N17.9, N18.9] Patient Active Problem List   Diagnosis Date Noted   Fall at home, initial encounter 05/09/2024   HFrEF (heart failure with reduced ejection fraction) (HCC) 05/09/2024   History of COPD 05/09/2024   History of dementia 05/09/2024   History of fusion of cervical spine 05/09/2024   Chronic anemia 05/09/2024   History of bipolar disorder 05/09/2024   Obstructive sleep apnea 05/09/2024   Acute kidney injury superimposed on chronic kidney disease 05/09/2024   Bipolar affective disorder (HCC) 04/07/2024   Dementia with behavioral disturbance (HCC) 04/07/2024   Rhinovirus infection 04/06/2024   Hyperlipidemia 04/06/2024   Paroxysmal atrial fibrillation (HCC) 04/06/2024   Chronic systolic CHF (congestive heart failure) (HCC) 04/06/2024   Chronic iron  deficiency anemia 04/06/2024   SOB (shortness of breath) 04/05/2024   Pressure injury of  skin 02/26/2024   Chest pain 02/25/2024   Atrial fibrillation with rapid ventricular response (HCC) 08/24/2023   Moderate persistent asthma with exacerbation 06/07/2023   COPD with acute exacerbation (HCC) 06/07/2023   CAP (community acquired pneumonia) 06/07/2023   Sepsis due to pneumonia (HCC) 06/06/2023   CKD stage 3b, GFR  30-44 ml/min (HCC) 08/01/2021   Hypokalemia 09/26/2018   Hypomagnesemia 09/26/2018   Acute on chronic diastolic CHF (congestive heart failure) (HCC) 09/26/2018   CAD (coronary artery disease) 09/26/2018   Asthma 09/26/2018   Depression with anxiety 09/26/2018   Asthma with status asthmaticus    Acute kidney injury superimposed on stage 3b chronic kidney disease (HCC) 09/25/2018   Benign prostatic hyperplasia 01/17/2014   Type II or unspecified type diabetes mellitus with neurological manifestations, not stated as uncontrolled(250.60) 02/09/2013   Unspecified urinary incontinence 02/09/2013   Esophageal reflux 02/09/2013   Insulin  dependent type 2 diabetes mellitus (HCC) 12/30/2012   Pyogenic arthritis of knee (HCC) 12/30/2012   PCP:  Nikki Hansel Atlas, MD Pharmacy:   [facility] [address] - [place], [place] - [address], [address] [place] [address] Phone: [phone] Fax: [phone]     Social Drivers of Health (SDOH) Social History: SDOH Screenings   Food Insecurity: No Food Insecurity (05/10/2024)  Housing: Low Risk  (05/10/2024)  Transportation Needs: No Transportation Needs (05/10/2024)  Utilities: Not At Risk (05/10/2024)  Social Connections: Socially Integrated (05/10/2024)  Tobacco Use: Medium Risk (05/09/2024)   SDOH Interventions:     Readmission Risk Interventions    02/29/2024    3:28 PM 12/31/2023   10:58 AM  Readmission Risk Prevention Plan  Transportation Screening Complete Complete  Medication Review Oceanographer) Complete Referral to Pharmacy  HRI or Home Care Consult Complete Complete  SW Recovery Care/Counseling Consult  Complete  Palliative Care Screening Not Applicable Not Applicable  Skilled Nursing Facility Not Applicable Not Applicable

## 2024-05-11 NOTE — Progress Notes (Signed)
 PROGRESS NOTE    Mike Hunter  FMW:[id number] DOB: Jan 26, 1951 DOA: 05/09/2024 PCP: Nikki Hansel Atlas, MD   Brief Narrative: 73 year old with past medical history significant for COPD, paroxysmal A-fib status post pacemaker on Xarelto , heart failure reduced ejection fraction 35 to 40%, CKD 3B, C5-C7 fusion surgery with hardware placement, chronic anemia, hyperlipidemia, urinary incontinence, anemia of chronic disease, bipolar disorder, dementia, obstructive sleep apnea on CPAP, morbid obesity, insulin -dependent diabetes type 2 presented to the ED complaining of falling out of his bed and ICD firing.  Chest x-ray showed bibasilar atelectasis.  Cardiomegaly without overt heart failure.  CT chest abdomen and pelvis no acute intrathoracic and intra-abdominal pelvic pathology.  Patchy dependent bilateral lower lobe consolidation, favor atelectasis over airspace disease.  CT cervical spine no acute abnormality.  Stable appearing anterior fusion hardware from C5-C7.  CT head no acute intracranial abnormality. Seen by cardiology no stress test needed.  He is a scheduled for cardiac ablation 12/1 as an outpatient.  Overnight developed worsening A-fib with RVR, he was started on IV amiodarone . Had low grade fever, cough, Started on IV Unasyn .    Assessment & Plan:   Principal Problem:   Acute kidney injury superimposed on chronic kidney disease Active Problems:   Acute kidney injury superimposed on stage 3b chronic kidney disease (HCC)   Fall at home, initial encounter   HFrEF (heart failure with reduced ejection fraction) (HCC)   Insulin  dependent type 2 diabetes mellitus (HCC)   Hyperlipidemia   History of COPD   History of dementia   History of fusion of cervical spine   Chronic anemia   History of bipolar disorder   Obstructive sleep apnea  1-AKI on superimposed CKD stage IIIb: - CT abdomen and pelvis no acute intrathoracic abdominal or pelvic finding. - Holding torsemide , Jardiance and  losartan . - Received IV fluids. -Cr base line 1.4--1.6 Renal function back to baseline.   Fever, leukocytosis.  Aspiration PNA.  Had low grade fever, cough, leukocytosis . CT showed atelectasis vs infiltrates bases.  Report dysphagia.  Speech consulted. Clear for diet.  Start IV Protonix .  Start IV Unasyn .  Check blood culture. Lactic acid.   Dysphagia;  Evaluated by speech, think more esophageal dysphagia.  Esophagogram ordered.  IV protonix .   Mechanical fall at home/physical deconditioning -PT OT consulted  Paroxysmal A-fib status post pacemaker placement On amiodarone  and Toprol , Xarelto  Initially scheduled for cardiac ablation 12/1 Developed A-fib with RVR and was started on IV amiodarone  overnight Cardiology consulted.   Chest pain;  Sharp in quality EKG ordered.  Troponin order.  Cardiology following.  On IV amiodarone  for A fib  Heart failure reduced ejection fraction, POA, chronic NYHA III, ejection fraction 35 to 40% - Continue Toprol  and Imdur  Holding torsemide  Jardiance and losartan  and spironolactone  due to AKI   Insulin -dependent diabetes type 2: - On Tresiba   History of dementia: - Continue memantine  and donezepil  Obstructive sleep apnea on CPAP - Continue CPAP at bedtime  Hyperlipidemia: - Continue atorvastatin   History of bipolar disorder/depression - Continue Zoloft   History of chronic anemia - Monitor hemoglobin     Estimated body mass index is 39.83 kg/m as calculated from the following:   Height as of 04/05/24: 5' 10 (1.778 m).   Weight as of this encounter: 125.9 kg.   DVT prophylaxis: Xarelto  Code Status: Full code Family Communication: Care discussed with patient Disposition Plan:  Status is: Inpatient Remains inpatient appropriate because: Management of A-fib RVR, fever leukocytosis  Consultants:  Urology  Procedures:    Antimicrobials:    Subjective: Alert, is not feeling well, still feeling weak and  without appetite.  Reports nausea.  Denies abdominal pain.  He reports chest pain ongoing on and off most of the part of the morning. He reports problem with swallowing Objective: Vitals:   05/11/24 0155 05/11/24 0300 05/11/24 0500 05/11/24 0713  BP: 118/74 121/71 107/76   Pulse: (!) 118 (!) 120    Resp: (!) 21 20 19    Temp: 99.2 F (37.3 C)  98.7 F (37.1 C)   TempSrc: Oral  Oral   SpO2: 98% 99% 98% 98%  Weight:        Intake/Output Summary (Last 24 hours) at 05/11/2024 0750 Last data filed at 05/11/2024 0518 Gross per 24 hour  Intake 240 ml  Output 3950 ml  Net -3710 ml   Filed Weights   05/09/24 2300  Weight: 125.9 kg    Examination:  General exam: Appears calm and comfortable  Respiratory system: Bilateral crackles. Respiratory effort normal. Cardiovascular system: S1 & S2 heard, irregular tachycardic Gastrointestinal system: Abdomen is nondistended, soft and nontender. No organomegaly or masses felt. Normal bowel sounds heard. Central nervous system: Alert and oriented.  Extremities: Symmetric 5 x 5 power.   Data Reviewed: I have personally reviewed following labs and imaging studies  CBC: Recent Labs  Lab 05/09/24 1715 05/10/24 0240 05/10/24 2324  WBC 10.6* 10.3 16.2*  NEUTROABS  --   --  14.0*  HGB 9.8* 9.2* 9.9*  HCT 34.9* 32.7* 34.3*  MCV 76.4* 77.1* 74.7*  PLT 366 288 358   Basic Metabolic Panel: Recent Labs  Lab 05/09/24 1715 05/10/24 0240 05/10/24 2324  NA 140 143 140  K 3.6 3.3* 3.9  CL 105 106 105  CO2 23 22 25   GLUCOSE 284* 249* 208*  BUN 32* 29* 19  CREATININE 1.87* 1.76* 1.57*  CALCIUM  8.0* 8.1* 8.6*  MG  --   --  2.5*   GFR: Estimated Creatinine Clearance: 54.2 mL/min (A) (by C-G formula based on SCr of 1.57 mg/dL (H)). Liver Function Tests: Recent Labs  Lab 05/09/24 1832 05/10/24 0240  AST 25 19  ALT 31 28  ALKPHOS 85 81  BILITOT 0.8 0.7  PROT 6.4* 6.0*  ALBUMIN  3.0* 2.8*   No results for input(s): LIPASE,  AMYLASE in the last 168 hours. No results for input(s): AMMONIA in the last 168 hours. Coagulation Profile: No results for input(s): INR, PROTIME in the last 168 hours. Cardiac Enzymes: No results for input(s): CKTOTAL, CKMB, CKMBINDEX, TROPONINI in the last 168 hours. BNP (last 3 results) Recent Labs    02/25/24 1520 04/05/24 1343 04/06/24 0554  PROBNP 2,137.0* 2,191.0* 2,599.0*   HbA1C: No results for input(s): HGBA1C in the last 72 hours. CBG: Recent Labs  Lab 05/10/24 0813 05/10/24 1153 05/10/24 1622 05/10/24 2142 05/11/24 0608  GLUCAP 203* 186* 187* 184* 206*   Lipid Profile: No results for input(s): CHOL, HDL, LDLCALC, TRIG, CHOLHDL, LDLDIRECT in the last 72 hours. Thyroid Function Tests: Recent Labs    05/10/24 2324  TSH 1.534   Anemia Panel: No results for input(s): VITAMINB12, FOLATE, FERRITIN, TIBC, IRON , RETICCTPCT in the last 72 hours. Sepsis Labs: No results for input(s): PROCALCITON, LATICACIDVEN in the last 168 hours.  No results found for this or any previous visit (from the past 240 hours).       Radiology Studies: DG Chest Port 1 View Result Date: 05/10/2024 EXAM: 1 VIEW(S)  XRAY OF THE CHEST 05/10/2024 09:56:00 PM COMPARISON: 05/09/2024 CLINICAL HISTORY: SOB (shortness of breath) FINDINGS: LINES, TUBES AND DEVICES: Cardiac pacemaker. LUNGS AND PLEURA: Mild pulmonary vascular congestion. No edema or consolidation. No focal pulmonary opacity. No pleural effusion. No pneumothorax. HEART AND MEDIASTINUM: Cardiac enlargement. Calcification of the aorta. BONES AND SOFT TISSUES: Postoperative changes in the cervical spine. LIMITATIONS/ARTIFACTS: Shallow inspiration. IMPRESSION: 1. Cardiomegaly with mild pulmonary vascular congestion, without edema or consolidation. Electronically signed by: Elsie Gravely MD 05/10/2024 10:06 PM EST RP Workstation: [id number]   DG Knee 1-2 Views Right Result Date:  05/10/2024 CLINICAL DATA:  Fall.  Right knee pain. EXAM: RIGHT KNEE - 2 VIEW COMPARISON:  None Available. FINDINGS: Prior knee arthroplasty with all 3 components in expected position. No No evidence of acute fracture or dislocation. Small to moderate knee joint effusion is seen. No other bone lesions identified. IMPRESSION: No acute fracture or dislocation. Small to moderate knee joint effusion. Prior knee arthroplasty. Electronically Signed   By: Norleen DELENA Kil M.D.   On: 05/10/2024 10:46   CT CHEST ABDOMEN PELVIS W CONTRAST Result Date: 05/09/2024 CLINICAL DATA:  Blunt trauma EXAM: CT CHEST, ABDOMEN, AND PELVIS WITH CONTRAST TECHNIQUE: Multidetector CT imaging of the chest, abdomen and pelvis was performed following the standard protocol during bolus administration of intravenous contrast. RADIATION DOSE REDUCTION: This exam was performed according to the departmental dose-optimization program which includes automated exposure control, adjustment of the mA and/or kV according to patient size and/or use of iterative reconstruction technique. CONTRAST:  75mL OMNIPAQUE  IOHEXOL  350 MG/ML SOLN COMPARISON:  02/25/2024 FINDINGS: CT CHEST FINDINGS Cardiovascular: The heart is unremarkable without pericardial effusion. Dual lead pacer is identified. No evidence of thoracic aortic aneurysm or dissection. 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 Atherosclerosis of the aorta and coronary vasculature. Mediastinum/Nodes: No enlarged mediastinal, hilar, or axillary lymph nodes. Thyroid gland, trachea, and esophagus demonstrate no significant findings. Lungs/Pleura: There is patchy bilateral lower lobe consolidation, favor hypoventilatory changes over airspace disease. No effusion or pneumothorax. The central airways are patent. Musculoskeletal: No acute or destructive bony abnormalities. Chronic T10 compression fracture again noted. Reconstructed images demonstrate no additional  findings. CT ABDOMEN PELVIS FINDINGS Hepatobiliary: No focal liver abnormality is seen. Status post cholecystectomy. No biliary dilatation. Pancreas: Unremarkable. No pancreatic ductal dilatation or surrounding inflammatory changes. Spleen: No splenic injury or perisplenic hematoma. Adrenals/Urinary Tract: Kidneys are stable. No evidence of acute renal injury. The adrenals and bladder are unremarkable. Stomach/Bowel: No bowel obstruction or ileus. No bowel wall thickening or inflammatory change. Vascular/Lymphatic: Aortic atherosclerosis. No enlarged abdominal or pelvic lymph nodes. Reproductive: Prostate is unremarkable. Other: No free fluid or free intraperitoneal gas. No abdominal wall hernia. Musculoskeletal: No acute or destructive bony abnormalities. Prominent Schmorl's nodes within the inferior endplate of L2 and superior endplate of L3. Reconstructed images demonstrate no additional findings. IMPRESSION: 1. No acute intrathoracic, intra-abdominal, or intrapelvic trauma. 2. Chronic T10 compression deformity, stable.  No acute fractures. 3. Patchy dependent bilateral lower lobe consolidation, favor atelectasis over airspace disease. 4.  Aortic Atherosclerosis (ICD10-I70.0). Electronically Signed   By: Ozell Daring M.D.   On: 05/09/2024 21:32   CT Cervical Spine Wo Contrast Result Date: 05/09/2024 EXAM: CT CERVICAL SPINE WITHOUT CONTRAST 05/09/2024 94:73:77 PM TECHNIQUE: CT of the cervical spine was performed without the administration of intravenous contrast. Multiplanar reformatted images are provided for review. Automated exposure control, iterative reconstruction, and/or weight based adjustment of the mA/kV was utilized to reduce the radiation  dose to as low as reasonably achievable. COMPARISON: Comparison is made with 01/30/2023. CLINICAL HISTORY: Neck trauma (Age >= 65y). FINDINGS: CERVICAL SPINE: BONES AND ALIGNMENT: Straightening of the cervical spine. Trace anterior listhesis C4 on C5 and C3 on  C4. Anterior fusion hardware from C5 through C7 with solid bone fusion. Facet alignment is maintained. No acute fracture or traumatic malalignment. DEGENERATIVE CHANGES: Multilevel facet degenerative changes with left greater than right foraminal narrowing. SOFT TISSUES: No prevertebral soft tissue swelling. Left carotid stent is noted. IMPRESSION: 1. No acute abnormality of the cervical spine. 2. Stable appearing Anterior fusion hardware from C5 through C7 Electronically signed by: Luke Bun MD 05/09/2024 06:41 PM EST RP Workstation: [id number]   DG Chest 2 View Result Date: 05/09/2024 EXAM: 2 VIEW(S) XRAY OF THE CHEST 05/09/2024 05:35:00 PM COMPARISON: 04/05/2024 CLINICAL HISTORY: CP FINDINGS: LINES, TUBES AND DEVICES: Left subclavian approach dual lead cardiac rhythm maintenance device noted. LUNGS AND PLEURA: Patchy left basilar opacities. No pleural effusion. No pneumothorax. HEART AND MEDIASTINUM: Cardiomegaly. Aortic arch atherosclerosis. BONES AND SOFT TISSUES: Multilevel thoracic osteophytosis. ACDF hardware noted. Chronic lower thoracic spine compression fracture. IMPRESSION: 1. Patchy left basilar opacities favored to represent atelectasis or scarring . 2. Cardiomegaly without overt failure Electronically signed by: Luke Bun MD 05/09/2024 06:33 PM EST RP Workstation: [id number]   CT HEAD WO CONTRAST Result Date: 05/09/2024 EXAM: CT HEAD WITHOUT CONTRAST 05/09/2024 05:26:22 PM TECHNIQUE: CT of the head was performed without the administration of intravenous contrast. Automated exposure control, iterative reconstruction, and/or weight based adjustment of the mA/kV was utilized to reduce the radiation dose to as low as reasonably achievable. COMPARISON: 06/20/2023 , [place] [id number], MRI 03/03/2023 CLINICAL HISTORY: Head trauma, [place] (Age >= 65y) FINDINGS: BRAIN AND VENTRICLES: No acute hemorrhage. No evidence of acute infarct. No hydrocephalus. No extra-axial collection. No mass effect or  midline shift. Cavum septum pellucidum variant noted. Similar-appearing left occipital hyperdense lesion. This measures 9 mm, series to image 14. Atherosclerotic calcifications within cavernous internal carotid and vertebral arteries. ORBITS: No acute abnormality. Bilateral lens replacement noted. SINUSES: No acute abnormality. SOFT TISSUES AND SKULL: No acute soft tissue abnormality. No skull fracture. IMPRESSION: 1. No acute intracranial abnormality. 2. 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 Electronically signed by: Luke Bun MD 05/09/2024 06:32 PM EST RP Workstation: [id number]        Scheduled Meds:  atorvastatin   80 mg Oral QHS   budesonide   2 mL Nebulization BID   budesonide -formoterol   2 puff Inhalation BID   clopidogrel   75 mg Oral Daily   docusate sodium   100 mg Oral BID   donepezil   10 mg Oral QHS   guaiFENesin   600 mg Oral BID   insulin  aspart  0-5 Units Subcutaneous QHS   insulin  aspart  0-6 Units Subcutaneous TID WC   insulin  glargine-yfgn  40 Units Subcutaneous q morning   iron  polysaccharides  150 mg Oral BID   isosorbide  mononitrate  30 mg Oral Daily   memantine   10 mg Oral BID   metoprolol  succinate  75 mg Oral BID   mirabegron  ER  50 mg Oral Daily   montelukast   10 mg Oral QHS   oxybutynin   10 mg Oral QHS   pantoprazole   40 mg Oral Daily   Rivaroxaban   15 mg Oral Q supper   sertraline   100 mg Oral Daily   sodium chloride  flush  3 mL Intravenous Q12H   sodium chloride  flush  3  mL Intravenous Q12H   Continuous Infusions:  amiodarone  30 mg/hr (05/11/24 0516)     LOS: 2 days    Time spent: 35 minutes    Orlen Leedy A Dhruva Orndoff, MD [facility]   If 7PM-7AM, please contact night-coverage [email or web address]  05/11/2024, 7:50 AM

## 2024-05-11 NOTE — Progress Notes (Signed)
 Pharmacy Antibiotic Note  Mike Hunter is a 73 y.o. male admitted on 05/09/2024 with sepsis.  Pharmacy has been consulted for vancomycin  dosing.  CXR Cardiomegaly with mild pulmonary vascular congestion, without edema or Consolidation; UA negative   Plan: Vancomycin  2500mg  IV x1 followed by 500 q12h (eAUC 404, Scr 1.57) Goal trough 15-64mcg/mL, Goal AUC 400-600 -F/u renal function, LOT, and culture data -F/u vancomycin  levels PRN per protocol   Weight: 125.9 kg (277 lb 9 oz) (Wt from admission in 03/2024)  Temp (24hrs), Avg:99.2 F (37.3 C), Min:98 F (36.7 C), Max:103.1 F (39.5 C)  Recent Labs  Lab 05/09/24 1715 05/10/24 0240 05/10/24 2324 05/11/24 0955 05/11/24 1322  WBC 10.6* 10.3 16.2*  --   --   CREATININE 1.87* 1.76* 1.57*  --   --   LATICACIDVEN  --   --   --  1.6 2.5*    Estimated Creatinine Clearance: 54.2 mL/min (A) (by C-G formula based on SCr of 1.57 mg/dL (H)).    Allergies  Allergen Reactions   Buprenorphine Hcl Nausea And Vomiting   Hydromorphone  Other (See Comments)    Mental Status Changes/HALLUCINATIONS   Mirtazapine Other (See Comments)    Nightmares & altered mental status    Codeine Nausea And Vomiting   Morphine  And Codeine Nausea And Vomiting    Antimicrobials this admission: Vanc 11/26> Unasyn  11/26>  Dose adjustments this admission:   Microbiology results: 11/26 [facility] 11/26 MRSA PCR  Thank you for allowing pharmacy to be a part of this patient's care.  Sharyne Glatter, PharmD, BCCCP Critical Care Clinical Pharmacist 05/11/2024 6:44 PM

## 2024-05-11 NOTE — Progress Notes (Signed)
 Pt's HR still in the 140's-150's sustained, pt on amiodarone  drip. WBC is 16.2, Dr. Franky made aware through secure chat, awaiting response at this time, rapid response nurse Alm made aware. Will continue to monitor for changes

## 2024-05-11 NOTE — Inpatient Diabetes Management (Signed)
 Inpatient Diabetes Program Recommendations  AACE/ADA: New Consensus Statement on Inpatient Glycemic Control (2015)  Target Ranges:  Prepandial:   less than 140 mg/dL      Peak postprandial:   less than 180 mg/dL (1-2 hours)      Critically ill patients:  140 - 180 mg/dL   Lab Results  Component Value Date   GLUCAP 206 (H) 05/11/2024   HGBA1C 6.7 (H) 01/04/2024    Review of Glycemic Control  Latest Reference Range & Units 05/10/24 08:13 05/10/24 11:53 05/10/24 16:22 05/10/24 21:42 05/11/24 06:08  Glucose-Capillary 70 - 99 mg/dL 796 (H) 813 (H) 812 (H) 184 (H) 206 (H)   Diabetes history: DM 2 Outpatient Diabetes medications: Tresiba  40 units in am, 46 units in pm, Novolog  0-13 units scale TID, Farxiga 10 mg daily  Current orders for Inpatient glycemic control:  Semglee  40 units Daily Novolog  0-6 units tid + hs  Inpatient Diabetes Program Recommendations:    -   Increase Semglee  to 45 units  Thanks,  Clotilda Bull RN, MSN, BC-ADM Inpatient Diabetes Coordinator Team Pager [phone] (8a-5p)

## 2024-05-12 DIAGNOSIS — N189 Chronic kidney disease, unspecified: Secondary | ICD-10-CM | POA: Diagnosis not present

## 2024-05-12 DIAGNOSIS — I4891 Unspecified atrial fibrillation: Secondary | ICD-10-CM | POA: Diagnosis not present

## 2024-05-12 DIAGNOSIS — N179 Acute kidney failure, unspecified: Secondary | ICD-10-CM | POA: Diagnosis not present

## 2024-05-12 LAB — CBC
HCT: 29.7 % — ABNORMAL LOW (ref 39.0–52.0)
Hemoglobin: 8.7 g/dL — ABNORMAL LOW (ref 13.0–17.0)
MCH: 22 pg — ABNORMAL LOW (ref 26.0–34.0)
MCHC: 29.3 g/dL — ABNORMAL LOW (ref 30.0–36.0)
MCV: 75 fL — ABNORMAL LOW (ref 80.0–100.0)
Platelets: 275 K/uL (ref 150–400)
RBC: 3.96 MIL/uL — ABNORMAL LOW (ref 4.22–5.81)
RDW: 18.3 % — ABNORMAL HIGH (ref 11.5–15.5)
WBC: 14.5 K/uL — ABNORMAL HIGH (ref 4.0–10.5)
nRBC: 0 % (ref 0.0–0.2)

## 2024-05-12 LAB — RESPIRATORY PANEL BY PCR

## 2024-05-12 LAB — BASIC METABOLIC PANEL WITH GFR
Anion gap: 8 (ref 5–15)
BUN: 16 mg/dL (ref 8–23)
CO2: 23 mmol/L (ref 22–32)
Calcium: 8.3 mg/dL — ABNORMAL LOW (ref 8.9–10.3)
Chloride: 105 mmol/L (ref 98–111)
Creatinine, Ser: 1.32 mg/dL — ABNORMAL HIGH (ref 0.61–1.24)
GFR, Estimated: 56 mL/min — ABNORMAL LOW (ref >60)
Glucose, Bld: 171 mg/dL — ABNORMAL HIGH (ref 70–99)
Potassium: 3.8 mmol/L (ref 3.5–5.1)
Sodium: 136 mmol/L (ref 135–145)

## 2024-05-12 LAB — GLUCOSE, CAPILLARY
Glucose-Capillary: 176 mg/dL — ABNORMAL HIGH (ref 70–99)
Glucose-Capillary: 206 mg/dL — ABNORMAL HIGH (ref 70–99)
Glucose-Capillary: 214 mg/dL — ABNORMAL HIGH (ref 70–99)
Glucose-Capillary: 271 mg/dL — ABNORMAL HIGH (ref 70–99)

## 2024-05-12 MED ORDER — VANCOMYCIN HCL 750 MG/150ML IV SOLN
750.0000 mg | Freq: Two times a day (BID) | INTRAVENOUS | Status: DC
  Administered 2024-05-12 – 2024-05-13 (×3): 750 mg via INTRAVENOUS
  Filled 2024-05-12 (×4): qty 150

## 2024-05-12 MED ORDER — POTASSIUM CHLORIDE CRYS ER 20 MEQ PO TBCR
20.0000 meq | EXTENDED_RELEASE_TABLET | Freq: Once | ORAL | Status: AC
  Administered 2024-05-12: 20 meq via ORAL
  Filled 2024-05-12: qty 1

## 2024-05-12 MED ORDER — NITROGLYCERIN 0.4 MG SL SUBL
0.4000 mg | SUBLINGUAL_TABLET | SUBLINGUAL | Status: DC | PRN
  Administered 2024-05-12: 0.4 mg via SUBLINGUAL
  Filled 2024-05-12: qty 1

## 2024-05-12 MED ORDER — ONDANSETRON HCL 4 MG/2ML IJ SOLN
4.0000 mg | Freq: Four times a day (QID) | INTRAMUSCULAR | Status: DC | PRN
  Administered 2024-05-12: 4 mg via INTRAVENOUS
  Filled 2024-05-12 (×2): qty 2

## 2024-05-12 MED ORDER — FUROSEMIDE 10 MG/ML IJ SOLN
20.0000 mg | Freq: Once | INTRAMUSCULAR | Status: AC
  Administered 2024-05-12: 20 mg via INTRAVENOUS
  Filled 2024-05-12: qty 2

## 2024-05-12 MED ORDER — AMIODARONE LOAD VIA INFUSION
150.0000 mg | Freq: Once | INTRAVENOUS | Status: AC
  Administered 2024-05-12: 150 mg via INTRAVENOUS
  Filled 2024-05-12: qty 83.34

## 2024-05-12 NOTE — Progress Notes (Signed)
  Progress Note  Patient Name: Mike Hunter Date of Encounter: 05/12/2024 [facility] Cardiologist: Jerel Balding, MD   Interval Summary   No acute overnight events. Patient reports feeling relatively well. No new or acute complaints.   Vital Signs Vitals:   05/12/24 0749 05/12/24 0752 05/12/24 0800 05/12/24 0855  BP:   115/60 115/60  Pulse:   (!) 107 100  Resp:   (!) 22   Temp:   97.6 F (36.4 C)   TempSrc:   Oral   SpO2: 98% 100% 100%   Weight:        Intake/Output Summary (Last 24 hours) at 05/12/2024 1110 Last data filed at 05/12/2024 1037 Gross per 24 hour  Intake 630 ml  Output 1660 ml  Net -1030 ml      05/09/2024   11:00 PM 04/07/2024    5:31 AM 04/05/2024   10:37 PM  Last 3 Weights  Weight (lbs) 277 lb 9 oz 277 lb 9.6 oz 267 lb 12.8 oz  Weight (kg) 125.9 kg 125.919 kg 121.473 kg      Telemetry/ECG  AF - Personally Reviewed  Physical Exam  General: Chronically ill appearing, in no acute distress.  Neck: No JVD.  Cardiac: Tachycardic, irregular rhythm.  Resp: Normal work of breathing.  Ext: No edema.  Neuro: No gross focal deficits.  Psych: Normal affect.   Assessment & Plan  Mike Hunter is a 73 y.o. 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   Unfortunately, found to have likely aspiration pneumonia with fever and leukocytosis.   Problem List:  AF/RVR AFL Sustained VT s/p LEFT BSCI DC ICD (DOI 03/01/24)  HFrEF CAD s/p LAD PCI  Aspiration pneumonia  Plan: -Continue amiodarone  for now. Continue metoprolol  XL 100mg  BID.  -Continue Xarelto  for now.  -Will need infectious issues to resolve prior to any EP procedure. Ultimately, likely to have AVN ablation and upgrade to CRT given his functional status.    Signed, Fonda Kitty, MD

## 2024-05-12 NOTE — Progress Notes (Signed)
 PROGRESS NOTE    Mike Hunter  FMW:[id number] DOB: Jun 10, 1951 DOA: 05/09/2024 PCP: Nikki Hansel Atlas, MD   Brief Narrative: 73 year old with past medical history significant for COPD, paroxysmal A-fib status post pacemaker on Xarelto , heart failure reduced ejection fraction 35 to 40%, CKD 3B, C5-C7 fusion surgery with hardware placement, chronic anemia, hyperlipidemia, urinary incontinence, anemia of chronic disease, bipolar disorder, dementia, obstructive sleep apnea on CPAP, morbid obesity, insulin -dependent diabetes type 2 presented to the ED complaining of falling out of his bed and ICD firing.  Chest x-ray showed bibasilar atelectasis.  Cardiomegaly without overt heart failure.  CT chest abdomen and pelvis no acute intrathoracic and intra-abdominal pelvic pathology.  Patchy dependent bilateral lower lobe consolidation, favor atelectasis over airspace disease.  CT cervical spine no acute abnormality.  Stable appearing anterior fusion hardware from C5-C7.  CT head no acute intracranial abnormality. Seen by cardiology no stress test needed.  He is a scheduled for cardiac ablation 12/1 as an outpatient.  Overnight developed worsening A-fib with RVR, he was started on IV amiodarone . Had low grade fever, cough, Started on IV Unasyn .    Assessment & Plan:   Principal Problem:   Acute kidney injury superimposed on chronic kidney disease Active Problems:   Acute kidney injury superimposed on stage 3b chronic kidney disease (HCC)   Fall at home, initial encounter   HFrEF (heart failure with reduced ejection fraction) (HCC)   Insulin  dependent type 2 diabetes mellitus (HCC)   Hyperlipidemia   History of COPD   History of dementia   History of fusion of cervical spine   Chronic anemia   History of bipolar disorder   Obstructive sleep apnea  1-AKI on superimposed CKD stage IIIb: - CT abdomen and pelvis no acute intrathoracic abdominal or pelvic finding. - Holding torsemide , Jardiance and  losartan . - Received IV fluids. -Cr base line 1.4--1.6 Renal function back to baseline.   Fever, leukocytosis.  Aspiration PNA.  Had  fever, cough, leukocytosis . CT showed atelectasis vs infiltrates bases.  Report dysphagia.  Speech consulted. Concern for esophageal dysphagia.  Continue with  IV Protonix .  Continue with IV Unasyn , vancomycin .  Blood culture. Lactic acid.  Respiratory panel ordered.   Dysphagia;  Evaluated by speech, think more esophageal dysphagia.  Esophagogram ordered.  IV protonix .   Mechanical fall at home/physical deconditioning -PT OT consulted  Paroxysmal A-fib status post pacemaker placement On amiodarone  and Toprol , Xarelto  Initially scheduled for cardiac ablation 12/1 Developed A-fib with RVR and was started on IV amiodarone  overnight Cardiology consulted.  Continue with amiodarone  gtt.   Chest pain;  Sharp in quality EKG ordered.  Troponin order.  Cardiology following.  On IV amiodarone  for A fib  Heart failure reduced ejection fraction, POA, chronic NYHA III, ejection fraction 35 to 40% - Continue Toprol  and Imdur  Holding torsemide  Jardiance and losartan  and spironolactone  due to AKI -wheezing on lung exam, will give a dose of IV lasix .   Insulin -dependent diabetes type 2: - On Tresiba   History of dementia: - Continue memantine  and donezepil  Obstructive sleep apnea on CPAP - Continue CPAP at bedtime  Hyperlipidemia: - Continue atorvastatin   History of bipolar disorder/depression - Continue Zoloft   History of chronic anemia - Monitor hemoglobin     Estimated body mass index is 39.83 kg/m as calculated from the following:   Height as of 04/05/24: 5' 10 (1.778 m).   Weight as of this encounter: 125.9 kg.   DVT prophylaxis: Xarelto  Code Status: Full code Family  Communication: Care discussed with patient Disposition Plan:  Status is: Inpatient Remains inpatient appropriate because: Management of A-fib RVR, fever  leukocytosis    Consultants:  Urology  Procedures:    Antimicrobials:    Subjective: He was able to sleep better last night. No further fever since yesterday afternoon.  Report dysphagia.   Objective: Vitals:   05/12/24 0000 05/12/24 0200 05/12/24 0402 05/12/24 0600  BP: 113/73 108/74 (!) 100/56 106/66  Pulse:   87 93  Resp: 18 18 20 18   Temp:   97.7 F (36.5 C)   TempSrc:   Axillary   SpO2:   99%   Weight:        Intake/Output Summary (Last 24 hours) at 05/12/2024 0726 Last data filed at 05/12/2024 0527 Gross per 24 hour  Intake 510 ml  Output 1000 ml  Net -490 ml   Filed Weights   05/09/24 2300  Weight: 125.9 kg    Examination:  General exam: NAD Respiratory system:BL crackles.  Cardiovascular system:S 1, S 2 IRR Gastrointestinal system: BS present, soft, nt Central nervous system: alert Extremities: Symmetric 5 x 5 power.   Data Reviewed: I have personally reviewed following labs and imaging studies  CBC: Recent Labs  Lab 05/09/24 1715 05/10/24 0240 05/10/24 2324 05/12/24 0230  WBC 10.6* 10.3 16.2* 14.5*  NEUTROABS  --   --  14.0*  --   HGB 9.8* 9.2* 9.9* 8.7*  HCT 34.9* 32.7* 34.3* 29.7*  MCV 76.4* 77.1* 74.7* 75.0*  PLT 366 288 358 275   Basic Metabolic Panel: Recent Labs  Lab 05/09/24 1715 05/10/24 0240 05/10/24 2324 05/12/24 0230  NA 140 143 140 136  K 3.6 3.3* 3.9 3.8  CL 105 106 105 105  CO2 23 22 25 23   GLUCOSE 284* 249* 208* 171*  BUN 32* 29* 19 16  CREATININE 1.87* 1.76* 1.57* 1.32*  CALCIUM  8.0* 8.1* 8.6* 8.3*  MG  --   --  2.5*  --    GFR: Estimated Creatinine Clearance: 64.4 mL/min (A) (by C-G formula based on SCr of 1.32 mg/dL (H)). Liver Function Tests: Recent Labs  Lab 05/09/24 1832 05/10/24 0240  AST 25 19  ALT 31 28  ALKPHOS 85 81  BILITOT 0.8 0.7  PROT 6.4* 6.0*  ALBUMIN  3.0* 2.8*   No results for input(s): LIPASE, AMYLASE in the last 168 hours. No results for input(s): AMMONIA in the last  168 hours. Coagulation Profile: No results for input(s): INR, PROTIME in the last 168 hours. Cardiac Enzymes: No results for input(s): CKTOTAL, CKMB, CKMBINDEX, TROPONINI in the last 168 hours. BNP (last 3 results) Recent Labs    02/25/24 1520 04/05/24 1343 04/06/24 0554  PROBNP 2,137.0* 2,191.0* 2,599.0*   HbA1C: No results for input(s): HGBA1C in the last 72 hours. CBG: Recent Labs  Lab 05/11/24 1139 05/11/24 1405 05/11/24 1520 05/11/24 2146 05/12/24 0621  GLUCAP 205* 178* 167* 178* 176*   Lipid Profile: No results for input(s): CHOL, HDL, LDLCALC, TRIG, CHOLHDL, LDLDIRECT in the last 72 hours. Thyroid Function Tests: Recent Labs    05/10/24 2324  TSH 1.534   Anemia Panel: No results for input(s): VITAMINB12, FOLATE, FERRITIN, TIBC, IRON , RETICCTPCT in the last 72 hours. Sepsis Labs: Recent Labs  Lab 05/11/24 0955 05/11/24 1322 05/11/24 1923 05/11/24 2140  LATICACIDVEN 1.6 2.5* 1.1 0.8    No results found for this or any previous visit (from the past 240 hours).       Radiology Studies: [facility] Chest [facility]  1 View Result Date: 05/10/2024 EXAM: 1 VIEW(S) XRAY OF THE CHEST 05/10/2024 09:56:00 PM COMPARISON: 05/09/2024 CLINICAL HISTORY: SOB (shortness of breath) FINDINGS: LINES, TUBES AND DEVICES: Cardiac pacemaker. LUNGS AND PLEURA: Mild pulmonary vascular congestion. No edema or consolidation. No focal pulmonary opacity. No pleural effusion. No pneumothorax. HEART AND MEDIASTINUM: Cardiac enlargement. Calcification of the aorta. BONES AND SOFT TISSUES: Postoperative changes in the cervical spine. LIMITATIONS/ARTIFACTS: Shallow inspiration. IMPRESSION: 1. Cardiomegaly with mild pulmonary vascular congestion, without edema or consolidation. Electronically signed by: Elsie Gravely MD 05/10/2024 10:06 PM EST RP Workstation: [id number]   DG Knee 1-2 Views Right Result Date: 05/10/2024 CLINICAL DATA:  Fall.  Right knee pain. EXAM:  RIGHT KNEE - 2 VIEW COMPARISON:  None Available. FINDINGS: Prior knee arthroplasty with all 3 components in expected position. No No evidence of acute fracture or dislocation. Small to moderate knee joint effusion is seen. No other bone lesions identified. IMPRESSION: No acute fracture or dislocation. Small to moderate knee joint effusion. Prior knee arthroplasty. Electronically Signed   By: Norleen DELENA Kil M.D.   On: 05/10/2024 10:46        Scheduled Meds:  atorvastatin   80 mg Oral QHS   budesonide   2 mL Nebulization BID   budesonide -formoterol   2 puff Inhalation BID   clopidogrel   75 mg Oral Daily   docusate sodium   100 mg Oral BID   donepezil   10 mg Oral QHS   guaiFENesin   600 mg Oral BID   insulin  aspart  0-5 Units Subcutaneous QHS   insulin  aspart  0-6 Units Subcutaneous TID WC   insulin  glargine-yfgn  40 Units Subcutaneous q morning   iron  polysaccharides  150 mg Oral BID   isosorbide  mononitrate  30 mg Oral Daily   memantine   10 mg Oral BID   metoprolol  succinate  100 mg Oral BID   mirabegron  ER  50 mg Oral Daily   montelukast   10 mg Oral QHS   oxybutynin   10 mg Oral QHS   pantoprazole  (PROTONIX ) IV  40 mg Intravenous Q12H   Rivaroxaban   20 mg Oral Q supper   sertraline   100 mg Oral Daily   sodium chloride  flush  3 mL Intravenous Q12H   sodium chloride  flush  3 mL Intravenous Q12H   Continuous Infusions:  amiodarone  30 mg/hr (05/12/24 0527)   ampicillin -sulbactam (UNASYN ) IV 3 g (05/12/24 0638)   vancomycin        LOS: 3 days    Time spent: 35 minutes    Zhanae Proffit A Malini Flemings, MD [facility]   If 7PM-7AM, please contact night-coverage [email or web address]  05/12/2024, 7:26 AM

## 2024-05-12 NOTE — Plan of Care (Signed)
  Problem: Education: Goal: Ability to describe self-care measures that may prevent or decrease complications (Diabetes Survival Skills Education) will improve Outcome: Progressing   Problem: Coping: Goal: Ability to adjust to condition or change in health will improve Outcome: Progressing

## 2024-05-12 NOTE — Progress Notes (Signed)
   Received page from RN that patient was complaining of chest pain. Pain reportedly started around lunch time today. He also has some nausea. Denies vomiting. Patient has also had increasing HR all day. HR was in the 80s-90s around 8 AM. Have been increasing. Currently in the 130s-150s.   I evaluated the patient at bedside. He tells me that his chest pain is sharp and stabbing. Seems to radiate to his back. He does have pain on palpation of his back and of his chest. Patient believes that his chest pain worsens if his heart rate goes up. He has also been having nausea that has been ongoing for a few days. No vomiting. Tells me that he does have musculoskeletal back pain at baseline.   EKG obtained- showed atrial fibrillation with HR 133 BPM. No ischemic changes. Patient also reported chest pain yesterday. hsTn negative x2.   Ordered a dose of SL nitroglycerin . Also ordered IV amiodarone  bolus to hopefully help with HR control. Patient is already on metoprolol  succinate 100 mg BID.   For nausea, patient is on zofran , protonix .   Rollo FABIENE Louder, PA-C 05/12/2024 4:51 PM

## 2024-05-12 NOTE — Progress Notes (Signed)
 Mobility Specialist Progress Note:    05/12/24 1105  Mobility  Activity Pivoted/transferred from bed to chair  Level of Assistance Contact guard assist, steadying assist  Assistive Device Other (Comment) (HHA)  Distance Ambulated (ft) 3 ft  Range of Motion/Exercises Active Assistive  Activity Response Tolerated fair;RN notified  Mobility Referral Yes  Mobility visit 1 Mobility  Mobility Specialist Start Time (ACUTE ONLY) 1105  Mobility Specialist Stop Time (ACUTE ONLY) 1128  Mobility Specialist Time Calculation (min) (ACUTE ONLY) 23 min   Received pt laying in bed agreeable to session. Pt c/o fatigue, and difficulty breathing. Pt able to sit up with HOB elevated and can pull self to EOB. Pt able to stand, take steps,and transfer w/ CGA. Placed pt in recliner w/ all needs met. RN notified.   Venetia Keel Mobility Specialist Please Neurosurgeon or Rehab Office at [phone]

## 2024-05-12 NOTE — Progress Notes (Signed)
 Pharmacy Antibiotic Note  Mike Hunter is a 73 y.o. male admitted on 05/09/2024 with sepsis.  Pharmacy has been consulted for vancomycin  dosing.  CXR Cardiomegaly with mild pulmonary vascular congestion, without edema or Consolidation; UA negative   Plan: Vancomycin  2500mg  IV x1 followed by 750mg  q12h (AUC 519.8, Scr 1.32, Vd 0.5) Goal trough 15-24mcg/mL, Goal AUC 400-550 -F/u renal function, LOT, and culture data -F/u vancomycin  levels PRN per protocol   Weight: 125.9 kg (277 lb 9 oz) (Wt from admission in 03/2024)  Temp (24hrs), Avg:98.8 F (37.1 C), Min:97.7 F (36.5 C), Max:103.1 F (39.5 C)  Recent Labs  Lab 05/09/24 1715 05/10/24 0240 05/10/24 2324 05/11/24 0955 05/11/24 1322 05/11/24 1923 05/11/24 2140 05/12/24 0230  WBC 10.6* 10.3 16.2*  --   --   --   --  14.5*  CREATININE 1.87* 1.76* 1.57*  --   --   --   --  1.32*  LATICACIDVEN  --   --   --  1.6 2.5* 1.1 0.8  --     Estimated Creatinine Clearance: 64.4 mL/min (A) (by C-G formula based on SCr of 1.32 mg/dL (H)).    Allergies  Allergen Reactions   Buprenorphine Hcl Nausea And Vomiting   Hydromorphone  Other (See Comments)    Mental Status Changes/HALLUCINATIONS   Mirtazapine Other (See Comments)    Nightmares & altered mental status    Codeine Nausea And Vomiting   Morphine  And Codeine Nausea And Vomiting    Antimicrobials this admission: Vanc 11/26> Unasyn  11/26>   Microbiology results: 11/26 BCX in progress 11/26 MRSA PCR ordered 11/26 Urine culture ordered 11/27 respiratory panel ordered   Thank you for allowing pharmacy to be involved with this patient's care.  Mendel Barter, PharmD PGY1 Clinical Pharmacist [facility]  05/12/2024 7:55 AM

## 2024-05-13 ENCOUNTER — Inpatient Hospital Stay (HOSPITAL_COMMUNITY)

## 2024-05-13 DIAGNOSIS — R09A2 Foreign body sensation, throat: Secondary | ICD-10-CM

## 2024-05-13 DIAGNOSIS — R1310 Dysphagia, unspecified: Secondary | ICD-10-CM

## 2024-05-13 DIAGNOSIS — Z7901 Long term (current) use of anticoagulants: Secondary | ICD-10-CM | POA: Diagnosis not present

## 2024-05-13 DIAGNOSIS — Z7902 Long term (current) use of antithrombotics/antiplatelets: Secondary | ICD-10-CM

## 2024-05-13 DIAGNOSIS — I4891 Unspecified atrial fibrillation: Secondary | ICD-10-CM | POA: Diagnosis not present

## 2024-05-13 DIAGNOSIS — J690 Pneumonitis due to inhalation of food and vomit: Secondary | ICD-10-CM | POA: Diagnosis not present

## 2024-05-13 LAB — BASIC METABOLIC PANEL WITH GFR
Anion gap: 11 (ref 5–15)
BUN: 18 mg/dL (ref 8–23)
CO2: 21 mmol/L — ABNORMAL LOW (ref 22–32)
Calcium: 8.3 mg/dL — ABNORMAL LOW (ref 8.9–10.3)
Chloride: 106 mmol/L (ref 98–111)
Creatinine, Ser: <0.3 mg/dL — ABNORMAL LOW (ref 0.61–1.24)
Glucose, Bld: 271 mg/dL — ABNORMAL HIGH (ref 70–99)
Potassium: 3.9 mmol/L (ref 3.5–5.1)
Sodium: 138 mmol/L (ref 135–145)

## 2024-05-13 LAB — URINE CULTURE: Culture: NO GROWTH

## 2024-05-13 LAB — SARS CORONAVIRUS 2 BY RT PCR: SARS Coronavirus 2 by RT PCR: NEGATIVE

## 2024-05-13 LAB — CBC
HCT: 31.1 % — ABNORMAL LOW (ref 39.0–52.0)
Hemoglobin: 8.9 g/dL — ABNORMAL LOW (ref 13.0–17.0)
MCH: 21.4 pg — ABNORMAL LOW (ref 26.0–34.0)
MCHC: 28.6 g/dL — ABNORMAL LOW (ref 30.0–36.0)
MCV: 74.9 fL — ABNORMAL LOW (ref 80.0–100.0)
Platelets: 344 K/uL (ref 150–400)
RBC: 4.15 MIL/uL — ABNORMAL LOW (ref 4.22–5.81)
RDW: 18.4 % — ABNORMAL HIGH (ref 11.5–15.5)
WBC: 16 K/uL — ABNORMAL HIGH (ref 4.0–10.5)
nRBC: 0 % (ref 0.0–0.2)

## 2024-05-13 LAB — MRSA NEXT GEN BY PCR, NASAL: MRSA by PCR Next Gen: NOT DETECTED

## 2024-05-13 LAB — GLUCOSE, CAPILLARY
Glucose-Capillary: 228 mg/dL — ABNORMAL HIGH (ref 70–99)
Glucose-Capillary: 291 mg/dL — ABNORMAL HIGH (ref 70–99)
Glucose-Capillary: 205 mg/dL — ABNORMAL HIGH (ref 70–99)
Glucose-Capillary: 194 mg/dL — ABNORMAL HIGH (ref 70–99)

## 2024-05-13 LAB — MAGNESIUM: Magnesium: 2.1 mg/dL (ref 1.7–2.4)

## 2024-05-13 MED ORDER — HEPARIN (PORCINE) 25000 UT/250ML-% IV SOLN
1950.0000 [IU]/h | INTRAVENOUS | Status: AC
  Administered 2024-05-13: 1400 [IU]/h via INTRAVENOUS
  Administered 2024-05-14: 1550 [IU]/h via INTRAVENOUS
  Administered 2024-05-15: 1900 [IU]/h via INTRAVENOUS
  Administered 2024-05-15: 1750 [IU]/h via INTRAVENOUS
  Administered 2024-05-15: 1900 [IU]/h via INTRAVENOUS
  Administered 2024-05-16 – 2024-05-19 (×7): 1950 [IU]/h via INTRAVENOUS
  Filled 2024-05-13 (×11): qty 250

## 2024-05-13 MED ORDER — VANCOMYCIN HCL IN DEXTROSE 1-5 GM/200ML-% IV SOLN
1000.0000 mg | Freq: Two times a day (BID) | INTRAVENOUS | Status: DC
  Administered 2024-05-13 – 2024-05-14 (×2): 1000 mg via INTRAVENOUS
  Filled 2024-05-13 (×3): qty 200

## 2024-05-13 MED ORDER — BISACODYL 10 MG RE SUPP
10.0000 mg | Freq: Once | RECTAL | Status: AC
  Administered 2024-05-13: 10 mg via RECTAL
  Filled 2024-05-13: qty 1

## 2024-05-13 MED ORDER — FUROSEMIDE 10 MG/ML IJ SOLN
20.0000 mg | Freq: Once | INTRAMUSCULAR | Status: AC
  Administered 2024-05-13: 20 mg via INTRAVENOUS
  Filled 2024-05-13: qty 2

## 2024-05-13 MED ORDER — DIGOXIN 125 MCG PO TABS
0.1250 mg | ORAL_TABLET | Freq: Every day | ORAL | Status: DC
  Administered 2024-05-13 – 2024-05-21 (×8): 0.125 mg via ORAL
  Filled 2024-05-13 (×8): qty 1

## 2024-05-13 NOTE — Progress Notes (Signed)
 PROGRESS NOTE    Mike Hunter  FMW:[id number] DOB: 03/27/1951 DOA: 05/09/2024 PCP: Nikki Hansel Atlas, MD   Brief Narrative: 73 year old with past medical history significant for COPD, paroxysmal A-fib status post pacemaker on Xarelto , heart failure reduced ejection fraction 35 to 40%, CKD 3B, C5-C7 fusion surgery with hardware placement, chronic anemia, hyperlipidemia, urinary incontinence, anemia of chronic disease, bipolar disorder, dementia, obstructive sleep apnea on CPAP, morbid obesity, insulin -dependent diabetes type 2 presented to the ED complaining of falling out of his bed and ICD firing.  Chest x-ray showed bibasilar atelectasis.  Cardiomegaly without overt heart failure.  CT chest abdomen and pelvis no acute intrathoracic and intra-abdominal pelvic pathology.  Patchy dependent bilateral lower lobe consolidation, favor atelectasis over airspace disease.  CT cervical spine no acute abnormality.  Stable appearing anterior fusion hardware from C5-C7.  CT head no acute intracranial abnormality. Seen by cardiology no stress test needed.  He is a scheduled for cardiac ablation 12/1 as an outpatient.  During hospitalization patient develops A-fib with RVR, he was started on IV amiodarone .He develops  fever, cough, Started on IV Unasyn . Concern for aspiration, concern for esophageal dysphagia. Plan to consult GI>    Assessment & Plan:   Principal Problem:   Acute kidney injury superimposed on chronic kidney disease Active Problems:   Acute kidney injury superimposed on stage 3b chronic kidney disease (HCC)   Fall at home, initial encounter   HFrEF (heart failure with reduced ejection fraction) (HCC)   Insulin  dependent type 2 diabetes mellitus (HCC)   Hyperlipidemia   History of COPD   History of dementia   History of fusion of cervical spine   Chronic anemia   History of bipolar disorder   Obstructive sleep apnea   Atrial fibrillation with RVR (HCC)  1-AKI on superimposed CKD  stage IIIb: - CT abdomen and pelvis no acute intrathoracic abdominal or pelvic finding. - Holding torsemide , Jardiance and losartan . - Received IV fluids. -Cr base line 1.4--1.6 Renal function back to baseline.   Fever, leukocytosis.  Aspiration PNA.  Had  fever, cough, leukocytosis . CT showed atelectasis vs infiltrates bases.  Report dysphagia.  Speech consulted. Concern for esophageal dysphagia.  Continue with  IV Protonix .  Continue with IV Unasyn , vancomycin .  Blood culture: no growth to date.  . Lactic acid normalized.  Respiratory panel negative. Will also check for covid.  11/28: report aspiration event this am, after drinking water.  -plan to repeat chest x ray. -Continue with antibiotics.  -NPO status. Re consult speech.   Paroxysmal A-fib status post pacemaker placement On amiodarone  and Toprol , Xarelto  Initially scheduled for cardiac ablation 12/1 Developed A-fib with RVR and was started on IV amiodarone  Cardiology consulted.  Continue with amiodarone  gtt.  Cardiology considering digoxin .   Dysphagia;  Evaluated by speech, think more esophageal dysphagia.  Esophagogram ordered.  IV protonix .  Recurrent aspiration event.  GI consulted.   Mechanical fall at home/physical deconditioning -PT OT consulted  Chest pain;  Sharp in quality EKG ordered.  Troponin negative.  Cardiology following. Associated with a ib, and pr PNA On IV amiodarone  for A fib  Heart failure reduced ejection fraction, POA, chronic NYHA III, ejection fraction 35 to 40% - Continue Toprol  and Imdur  Holding torsemide  Jardiance and losartan  and spironolactone  due to AKI -Received IV lasix  11/27. -awaiting renal function today, will consider ordering IV lasix .   Insulin -dependent diabetes type 2: - On Tresiba   History of dementia: - Continue memantine  and donezepil  Obstructive sleep  apnea on CPAP - Continue CPAP at bedtime  Hyperlipidemia: - Continue atorvastatin   History of  bipolar disorder/depression - Continue Zoloft   History of chronic anemia - Monitor hemoglobin     Estimated body mass index is 39.83 kg/m as calculated from the following:   Height as of 04/05/24: 5' 10 (1.778 m).   Weight as of this encounter: 125.9 kg.   DVT prophylaxis: Xarelto  Code Status: Full code Family Communication: Care discussed with patient, wife over phone.  Disposition Plan:  Status is: Inpatient Remains inpatient appropriate because: Management of A-fib RVR, fever leukocytosis    Consultants:  Urology  Procedures:    Antimicrobials:    Subjective: He is not feeling well today.  Had aspiration event this am around 7;15 am. After drinking water, think his mouth was very dry.  Report some chest pain at times.    Objective: Vitals:   05/12/24 2112 05/12/24 2332 05/13/24 0000 05/13/24 0300  BP: 115/72 113/84 119/84 119/85  Pulse: (!) 126 (!) 134  99  Resp: (!) 21 (!) 24 (!) 22 20  Temp:  (!) 97.5 F (36.4 C)  98.4 F (36.9 C)  TempSrc:  Oral  Oral  SpO2:  99%  100%  Weight:        Intake/Output Summary (Last 24 hours) at 05/13/2024 9271 Last data filed at 05/13/2024 0548 Gross per 24 hour  Intake 120 ml  Output 3035 ml  Net -2915 ml   Filed Weights   05/09/24 2300  Weight: 125.9 kg    Examination:  General exam: NAD Respiratory system: BL crackles, wheezing Cardiovascular system: S1, S 2 RRR Gastrointestinal system: BS present, soft nt Central nervous system: alert Extremities: Symmetric 5 x 5 power.   Data Reviewed: I have personally reviewed following labs and imaging studies  CBC: Recent Labs  Lab 05/09/24 1715 05/10/24 0240 05/10/24 2324 05/12/24 0230  WBC 10.6* 10.3 16.2* 14.5*  NEUTROABS  --   --  14.0*  --   HGB 9.8* 9.2* 9.9* 8.7*  HCT 34.9* 32.7* 34.3* 29.7*  MCV 76.4* 77.1* 74.7* 75.0*  PLT 366 288 358 275   Basic Metabolic Panel: Recent Labs  Lab 05/09/24 1715 05/10/24 0240 05/10/24 2324  05/12/24 0230  NA 140 143 140 136  K 3.6 3.3* 3.9 3.8  CL 105 106 105 105  CO2 23 22 25 23   GLUCOSE 284* 249* 208* 171*  BUN 32* 29* 19 16  CREATININE 1.87* 1.76* 1.57* 1.32*  CALCIUM  8.0* 8.1* 8.6* 8.3*  MG  --   --  2.5*  --    GFR: Estimated Creatinine Clearance: 64.4 mL/min (A) (by C-G formula based on SCr of 1.32 mg/dL (H)). Liver Function Tests: Recent Labs  Lab 05/09/24 1832 05/10/24 0240  AST 25 19  ALT 31 28  ALKPHOS 85 81  BILITOT 0.8 0.7  PROT 6.4* 6.0*  ALBUMIN  3.0* 2.8*   No results for input(s): LIPASE, AMYLASE in the last 168 hours. No results for input(s): AMMONIA in the last 168 hours. Coagulation Profile: No results for input(s): INR, PROTIME in the last 168 hours. Cardiac Enzymes: No results for input(s): CKTOTAL, CKMB, CKMBINDEX, TROPONINI in the last 168 hours. BNP (last 3 results) Recent Labs    02/25/24 1520 04/05/24 1343 04/06/24 0554  PROBNP 2,137.0* 2,191.0* 2,599.0*   HbA1C: No results for input(s): HGBA1C in the last 72 hours. CBG: Recent Labs  Lab 05/12/24 0621 05/12/24 1203 05/12/24 1558 05/12/24 2140 05/13/24 0618  GLUCAP 176* 206*  214* 271* 228*   Lipid Profile: No results for input(s): CHOL, HDL, LDLCALC, TRIG, CHOLHDL, LDLDIRECT in the last 72 hours. Thyroid Function Tests: Recent Labs    05/10/24 2324  TSH 1.534   Anemia Panel: No results for input(s): VITAMINB12, FOLATE, FERRITIN, TIBC, IRON , RETICCTPCT in the last 72 hours. Sepsis Labs: Recent Labs  Lab 05/11/24 0955 05/11/24 1322 05/11/24 1923 05/11/24 2140  LATICACIDVEN 1.6 2.5* 1.1 0.8    Recent Results (from the past 240 hours)  Culture, blood (Routine X 2) w Reflex to ID Panel     Status: None (Preliminary result)   Collection Time: 05/11/24 10:37 AM   Specimen: BLOOD  Result Value Ref Range Status   Specimen Description BLOOD RIGHT ANTECUBITAL  Final   Special Requests   Final    BOTTLES DRAWN AEROBIC  ONLY Blood Culture results may not be optimal due to an inadequate volume of blood received in culture bottles   Culture   Final    NO GROWTH < 24 HOURS Performed at [facility], [address], [place], [place] [address]    Report Status PENDING  Incomplete  Culture, blood (Routine X 2) w Reflex to ID Panel     Status: None (Preliminary result)   Collection Time: 05/11/24 10:43 AM   Specimen: BLOOD  Result Value Ref Range Status   Specimen Description BLOOD RIGHT ANTECUBITAL  Final   Special Requests   Final    BOTTLES DRAWN AEROBIC AND ANAEROBIC Blood Culture adequate volume   Culture   Final    NO GROWTH < 24 HOURS Performed at [facility], [address], [place], [place] [address]    Report Status PENDING  Incomplete  Respiratory (~20 pathogens) panel by PCR     Status: None   Collection Time: 05/12/24  7:26 AM   Specimen: Nasopharyngeal Swab; Respiratory  Result Value Ref Range Status   Adenovirus NOT DETECTED NOT DETECTED Final   Coronavirus 229E NOT DETECTED NOT DETECTED Final    Comment: (NOTE) The Coronavirus on the Respiratory Panel, DOES NOT test for the novel  Coronavirus (2019 nCoV)    Coronavirus HKU1 NOT DETECTED NOT DETECTED Final   Coronavirus NL63 NOT DETECTED NOT DETECTED Final   Coronavirus OC43 NOT DETECTED NOT DETECTED Final   Metapneumovirus NOT DETECTED NOT DETECTED Final   Rhinovirus / Enterovirus NOT DETECTED NOT DETECTED Final   Influenza A NOT DETECTED NOT DETECTED Final   Influenza B NOT DETECTED NOT DETECTED Final   Parainfluenza Virus 1 NOT DETECTED NOT DETECTED Final   Parainfluenza Virus 2 NOT DETECTED NOT DETECTED Final   Parainfluenza Virus 3 NOT DETECTED NOT DETECTED Final   Parainfluenza Virus 4 NOT DETECTED NOT DETECTED Final   Respiratory Syncytial Virus NOT DETECTED NOT DETECTED Final   Bordetella pertussis NOT DETECTED NOT DETECTED Final   Bordetella Parapertussis NOT DETECTED NOT DETECTED Final   Chlamydophila  pneumoniae NOT DETECTED NOT DETECTED Final   Mycoplasma pneumoniae NOT DETECTED NOT DETECTED Final    Comment: Performed at [facility], [address] [place], [place] [address]         Radiology Studies: No results found.       Scheduled Meds:  atorvastatin   80 mg Oral QHS   budesonide   2 mL Nebulization BID   budesonide -formoterol   2 puff Inhalation BID   clopidogrel   75 mg Oral Daily   docusate sodium   100 mg Oral BID   donepezil   10 mg Oral QHS   guaiFENesin   600 mg Oral BID   insulin  aspart  0-5 Units Subcutaneous QHS   insulin  aspart  0-6 Units Subcutaneous TID WC   insulin  glargine-yfgn  40 Units Subcutaneous q morning   iron  polysaccharides  150 mg Oral BID   isosorbide  mononitrate  30 mg Oral Daily   memantine   10 mg Oral BID   metoprolol  succinate  100 mg Oral BID   mirabegron  ER  50 mg Oral Daily   montelukast   10 mg Oral QHS   oxybutynin   10 mg Oral QHS   pantoprazole  (PROTONIX ) IV  40 mg Intravenous Q12H   Rivaroxaban   20 mg Oral Q supper   sertraline   100 mg Oral Daily   sodium chloride  flush  3 mL Intravenous Q12H   sodium chloride  flush  3 mL Intravenous Q12H   Continuous Infusions:  amiodarone  30 mg/hr (05/13/24 0641)   ampicillin -sulbactam (UNASYN ) IV 3 g (05/13/24 0615)   vancomycin  750 mg (05/12/24 1847)     LOS: 4 days    Time spent: 35 minutes    Kayia Billinger A Jaiya Mooradian, MD [facility]   If 7PM-7AM, please contact night-coverage [email or web address]  05/13/2024, 7:28 AM

## 2024-05-13 NOTE — Progress Notes (Signed)
 Pharmacy Antibiotic Note  Mike Hunter is a 73 y.o. male admitted on 05/09/2024 with sepsis.  Pharmacy has been consulted for vancomycin  dosing.  CXR Cardiomegaly w/ mild pulmonary vascular congestion, w/o edema or consolidation;  SCr has dropped again to < 0.3, will adjust vancomycin  dose for this change  Plan: Adjust Vancomycin  1000 mg q12h (estAUC 436.6, used Scr 0.8, Vd 0.5) Goal AUC 400-550 -F/u renal function, LOT, and culture data -F/u vancomycin  levels PRN per protocol   Weight: 125.9 kg (277 lb 9 oz) (Wt from admission in 03/2024)  Temp (24hrs), Avg:98 F (36.7 C), Min:97.5 F (36.4 C), Max:98.4 F (36.9 C)  Recent Labs  Lab 05/09/24 1715 05/10/24 0240 05/10/24 2324 05/11/24 0955 05/11/24 1322 05/11/24 1923 05/11/24 2140 05/12/24 0230 05/13/24 1015  WBC 10.6* 10.3 16.2*  --   --   --   --  14.5* 16.0*  CREATININE 1.87* 1.76* 1.57*  --   --   --   --  1.32* <0.30*  LATICACIDVEN  --   --   --  1.6 2.5* 1.1 0.8  --   --     CrCl cannot be calculated (This lab value cannot be used to calculate CrCl because it is not a number: <0.30).    Allergies  Allergen Reactions   Buprenorphine Hcl Nausea And Vomiting   Hydromorphone  Other (See Comments)    Mental Status Changes/HALLUCINATIONS   Mirtazapine Other (See Comments)    Nightmares & altered mental status    Codeine Nausea And Vomiting   Morphine  And Codeine Nausea And Vomiting    Antimicrobials this admission: Vanc 11/26> Unasyn  11/26>   Microbiology results: 11/26 BCX ng x 2d 11/26 MRSA PCR not detected 11/26 Urine culture ngF 11/27 respiratory panel none detected   Thank you for allowing pharmacy to be a part of this patient's care.   Ardice Boyan C Rosemond Lyttle, PharmD 05/13/2024 1:50 PM  **Pharmacist phone directory can be found on [email or web address] listed under [facility]**

## 2024-05-13 NOTE — Progress Notes (Signed)
 Physical Therapy Treatment Patient Details Name: Mike Hunter MRN: [id number] DOB: 17-Dec-1950 Today's Date: 05/13/2024   History of Present Illness 73 y/o M adm 05/09/24 after fall out of bed with Lt chest pain and ICD shock. Pt with AKI on CKD. Plan for ablation possibly 12/1. PMHx:  Afib, bipolar d/o, CHF, COPD, BPH, T2DM, HTN, reflux, CAD, ICD, cervical fusion    PT Comments  Pt admitted with above diagnosis. Pt reports having a difficult night and didn't want to work with therapy. PT and nurse encouraged pt to get to chair. Pt was able to pivot to recliner with min assist and cues.  HR incr to 136 bpm with activity.  Pt continues to need 3LO2 to keep sats >90%.  Pt able to tolerate a few exercises however somewhat limited today due to several issues. Will continue to follow acutely.  Pt currently with functional limitations due to the deficits listed below (see PT Problem List). Pt will benefit from acute skilled PT to increase their independence and safety with mobility to allow discharge.       If plan is discharge home, recommend the following: A little help with walking and/or transfers;Assist for transportation;Help with stairs or ramp for entrance   Can travel by private vehicle        Equipment Recommendations  None recommended by PT    Recommendations for Other Services       Precautions / Restrictions Precautions Precautions: Fall Recall of Precautions/Restrictions: Intact Precaution/Restrictions Comments: Desaturates with little activity Restrictions Weight Bearing Restrictions Per Provider Order: No     Mobility  Bed Mobility Overal bed mobility: Needs Assistance Bed Mobility: Supine to Sit, Sit to Supine     Supine to sit: HOB elevated, Contact guard, Used rails Sit to supine: Contact guard assist   General bed mobility comments: Pt took incr time but was able to come to EOB without physical assist.    Transfers Overall transfer level: Needs  assistance Equipment used: Rolling walker (2 wheels) Transfers: Sit to/from Stand, Bed to chair/wheelchair/BSC Sit to Stand: Contact guard assist Stand pivot transfers: Min assist         General transfer comment: cues for hand placement. Pt needed no assist to stand and was safe once cued for hand placement. Pt was able to stand pivot with use of RW with min assist steadying at times. HR to 136 bpm with transfer to chair. Pt declined ambulation as he was upset stating,  I aspirated this am.  Nurse encouraged pt to at least get to chair with PT and pt agreeable with encouragement.    Ambulation/Gait                   Stairs             Wheelchair Mobility     Tilt Bed    Modified Rankin (Stroke Patients Only)       Balance Overall balance assessment: History of Falls, Needs assistance Sitting-balance support: Feet supported, No upper extremity supported Sitting balance-Leahy Scale: Good     Standing balance support: During functional activity, Reliant on assistive device for balance, Bilateral upper extremity supported Standing balance-Leahy Scale: Poor Standing balance comment: RW in standing                            Communication Communication Communication: Impaired Factors Affecting Communication: Hearing impaired  Cognition Arousal: Alert Behavior During Therapy: WFL for tasks  assessed/performed   PT - Cognitive impairments: Problem solving                         Following commands: Intact      Cueing Cueing Techniques: Verbal cues  Exercises General Exercises - Lower Extremity Ankle Circles/Pumps: AROM, Both, 5 reps, Supine Long Arc Quad: AROM, Both, 10 reps, Seated Hip Flexion/Marching: AROM, Both, 10 reps, Standing Other Exercises Other Exercises: Pt fatigued quickly with exercises and HR incr with little activity therfore limited in exercises.    General Comments General comments (skin integrity, edema,  etc.): 3LO2 with sats > 90% throughout. HR 103-136 bpm with activity.      Pertinent Vitals/Pain Pain Assessment Pain Assessment: No/denies pain    Home Living                          Prior Function            PT Goals (current goals can now be found in the care plan section) Acute Rehab PT Goals Patient Stated Goal: return home, get my ablation next week Progress towards PT goals: Progressing toward goals    Frequency    Min 2X/week      PT Plan      Co-evaluation              AM-PAC PT 6 Clicks Mobility   Outcome Measure  Help needed turning from your back to your side while in a flat bed without using bedrails?: A Little Help needed moving from lying on your back to sitting on the side of a flat bed without using bedrails?: A Little Help needed moving to and from a bed to a chair (including a wheelchair)?: A Little Help needed standing up from a chair using your arms (e.g., wheelchair or bedside chair)?: A Little Help needed to walk in hospital room?: Total Help needed climbing 3-5 steps with a railing? : Total 6 Click Score: 14    End of Session Equipment Utilized During Treatment: Gait belt;Oxygen Activity Tolerance: Patient limited by fatigue Patient left: with call bell/phone within reach;in chair;with chair alarm set Nurse Communication: Mobility status PT Visit Diagnosis: Other abnormalities of gait and mobility (R26.89)     Time: 9083-9060 PT Time Calculation (min) (ACUTE ONLY): 23 min  Charges:    $Therapeutic Exercise: 8-22 mins $Therapeutic Activity: 8-22 mins PT General Charges $$ ACUTE PT VISIT: 1 Visit                     Desirae Mancusi M,PT Acute Rehab Services [phone]    Stephane JULIANNA Bevel 05/13/2024, 11:19 AM

## 2024-05-13 NOTE — Progress Notes (Signed)
 Occupational Therapy Treatment Patient Details Name: Mike Hunter MRN: [id number] DOB: 10-19-1950 Today's Date: 05/13/2024   History of present illness 73 y/o M adm 05/09/24 after fall out of bed with Lt chest pain and ICD shock. Pt with AKI on CKD. Plan for ablation possibly 12/1. PMHx:  Afib, bipolar d/o, CHF, COPD, BPH, T2DM, HTN, reflux, CAD, ICD, cervical fusion   OT comments  Pt agreeable to participate although he recently underwent a swallowing test. HR 113 then increases to 147 when mobilizing to EOB and standing. Took several steps EOB. SpO2 above 90 on 2L however pt with increased WOB with minimal activity. Family prefers for pt to DC home with [facility] services however he will only have the assistance of his elderly wife. Recommend HHOT and HH Aide. Acute OT to continue to follow.  Pt needs to mobilize OOB to chair with nsg over the weekend. Recommend OOB for all meals.       If plan is discharge home, recommend the following:  A little help with walking and/or transfers;A little help with bathing/dressing/bathroom;Assistance with cooking/housework;Assist for transportation;Help with stairs or ramp for entrance   Equipment Recommendations  None recommended by OT    Recommendations for Other Services      Precautions / Restrictions Precautions Precautions: Fall Recall of Precautions/Restrictions: Intact Precaution/Restrictions Comments: watch HR Restrictions Weight Bearing Restrictions Per Provider Order: No       Mobility Bed Mobility Overal bed mobility: Needs Assistance Bed Mobility: Supine to Sit, Sit to Supine     Supine to sit: HOB elevated, Contact guard, Used rails Sit to supine: Contact guard assist   General bed mobility comments: Pt took incr time but was able to come to EOB without physical assist.    Transfers Overall transfer level: Needs assistance Equipment used: Rolling walker (2 wheels) Transfers: Sit to/from Stand, Sit to Stand: Contact guard  assist Stand pivot transfers: Min assist (stand step)           Balance Overall balance assessment: History of Falls, Needs assistance Sitting-balance support: Feet supported, No upper extremity supported Sitting balance-Leahy Scale: Good     Standing balance support: During functional activity, Reliant on assistive device for balance, Bilateral upper extremity supported Standing balance-Leahy Scale: Poor Standing balance comment: RW in standing                           ADL either performed or assessed with clinical judgement   ADL Overall ADL's : Needs assistance/impaired Eating/Feeding: Set up;Sitting   Grooming: Wash/dry hands;Wash/dry face;Oral care;Set up;Sitting   Upper Body Bathing: Set up;Sitting   Lower Body Bathing: Moderate assistance;Sit to/from stand Lower Body Bathing Details (indicate cue type and reason): limited by gurney Upper Body Dressing : Minimal assistance;Sitting   Lower Body Dressing: Moderate assistance;Sit to/from stand;Cueing for compensatory techniques   Toilet Transfer: Minimal assistance;Stand-pivot;BSC/3in1;Rolling walker (2 wheels)   Toileting- Clothing Manipulation and Hygiene: Moderate assistance;Sit to/from stand       Functional mobility during ADLs: Minimal assistance;Rolling walker (2 wheels)      Extremity/Trunk Assessment Upper Extremity Assessment RUE Deficits / Details: PROM limited in shoulder to 90 degrees due to pain. othreweise WFL. Strength:  shoulder 2/5, biceps/triceps 3+/5, hand 3/5 RUE Sensation: decreased light touch RUE Coordination: decreased fine motor;decreased gross motor LUE Deficits / Details: AROM WFL Strength shoulder 3+/5, biceps/trieps 4/5, grip 4/5 LUE Sensation: WNL LUE Coordination: WNL  Vision   Vision Assessment?: No apparent visual deficits   Perception Perception Perception: Within Functional Limits   Praxis Praxis Praxis: Not tested   Communication  Communication Communication: Impaired Factors Affecting Communication: Hearing impaired   Cognition Arousal: Alert Behavior During Therapy: WFL for tasks assessed/performed (BiPolar/hx of conversion disorder) Cognition: History of cognitive impairments (at baseline now per family) Will need direct S with medication management as family reports pt not managing meds safely             OT - Cognition Comments: pt at baseline with STM deficits                 Following commands: Intact        Cueing   Cueing Techniques: Verbal cues  Exercises Other Exercises Other Exercises: Pt fatigued quickly' HR incr with little activity    Shoulder Instructions       General Comments      Pertinent Vitals/ Pain       Pain Assessment Pain Assessment: No/denies pain  Home Living                                          Prior Functioning/Environment              Frequency  Min 2X/week        Progress Toward Goals  OT Goals(current goals can now be found in the care plan section)  Progress towards OT goals: Progressing toward goals  Acute Rehab OT Goals Patient Stated Goal: home with home health OT Goal Formulation: With patient Time For Goal Achievement: 05/24/24 Potential to Achieve Goals: Good ADL Goals Pt Will Perform Grooming: with supervision;standing Pt Will Perform Lower Body Bathing: with supervision;sit to/from stand Pt Will Perform Lower Body Dressing: with supervision;sit to/from stand Pt Will Perform Tub/Shower Transfer: Shower transfer;with supervision;grab bars;shower seat;rolling walker;ambulating Additional ADL Goal #1: Pt will walk to bathroom w walker and complete all toileting with supervision  Plan      Co-evaluation                 AM-PAC OT 6 Clicks Daily Activity     Outcome Measure   Help from another person eating meals?: A Little Help from another person taking care of personal grooming?: A  Little Help from another person toileting, which includes using toliet, bedpan, or urinal?: A Lot Help from another person bathing (including washing, rinsing, drying)?: A Lot Help from another person to put on and taking off regular upper body clothing?: A Little Help from another person to put on and taking off regular lower body clothing?: A Lot 6 Click Score: 15    End of Session Equipment Utilized During Treatment: Rolling walker (2 wheels);Oxygen  OT Visit Diagnosis: Unsteadiness on feet (R26.81);Other abnormalities of gait and mobility (R26.89);Repeated falls (R29.6);Pain   Activity Tolerance Patient limited by fatigue;No increased pain   Patient Left in bed;with call bell/phone within reach   Nurse Communication Mobility status;Other (comment) (new pain R knee since fall)        Time: 1350-1415 OT Time Calculation (min): 25 min  Charges: OT General Charges $OT Visit: 1 Visit OT Treatments $Self Care/Home Management : 23-37 mins  Kreg Sink, OT/L   Acute OT Clinical Specialist Acute Rehabilitation Services Pager [phone] Office [phone]   [facility] 05/13/2024, 2:27 PM

## 2024-05-13 NOTE — Plan of Care (Signed)
   Problem: Education: Goal: Knowledge of General Education information will improve Description: Including pain rating scale, medication(s)/side effects and non-pharmacologic comfort measures Outcome: Progressing   Problem: Health Behavior/Discharge Planning: Goal: Ability to manage health-related needs will improve Outcome: Progressing   Problem: Coping: Goal: Level of anxiety will decrease Outcome: Progressing   Problem: Pain Managment: Goal: General experience of comfort will improve and/or be controlled Outcome: Progressing   Problem: Safety: Goal: Ability to remain free from injury will improve Outcome: Progressing

## 2024-05-13 NOTE — Progress Notes (Signed)
 PHARMACY - ANTICOAGULATION CONSULT NOTE  Pharmacy Consult for heparin  gtt (transition from Xarelto ) Indication: atrial fibrillation  Allergies  Allergen Reactions   Buprenorphine Hcl Nausea And Vomiting   Hydromorphone  Other (See Comments)    Mental Status Changes/HALLUCINATIONS   Mirtazapine Other (See Comments)    Nightmares & altered mental status    Codeine Nausea And Vomiting   Morphine  And Codeine Nausea And Vomiting    Patient Measurements: Height: 5' 10 (177.8 cm) Weight: 125.9 kg (277 lb 9 oz) IBW/kg (Calculated) : 73 HEPARIN  DW (KG): 101.6  Vital Signs: Temp: 98 F (36.7 C) (11/28 1052) Temp Source: Oral (11/28 1052) BP: 114/72 (11/28 1052) Pulse Rate: 122 (11/28 1052)  Labs: Recent Labs    05/10/24 2324 05/11/24 1037 05/11/24 1322 05/12/24 0230 05/13/24 1015  HGB 9.9*  --   --  8.7* 8.9*  HCT 34.3*  --   --  29.7* 31.1*  PLT 358  --   --  275 344  CREATININE 1.57*  --   --  1.32* <0.30*  TROPONINIHS 11 10 9   --   --     CrCl cannot be calculated (This lab value cannot be used to calculate CrCl because it is not a number: <0.30).   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   05/08/2024   albuterol  (PROVENTIL  HFA;VENTOLIN  HFA) 108 (90 BASE) MCG/ACT inhaler Inhale 2 puffs into the lungs every 4 (four) hours  as needed for wheezing or shortness of breath.   05/09/2024 Morning   amiodarone  (PACERONE ) 200 MG tablet Take 1 tablet (200 mg total) by mouth 2 (two) times daily. 60 tablet 3 05/09/2024 Morning   atorvastatin  (LIPITOR ) 80 MG tablet Take 80 mg by mouth at bedtime.   05/08/2024   azithromycin  (ZITHROMAX ) 250 MG tablet Take 250 mg by mouth every Monday, Wednesday, and Friday.   05/09/2024 Morning   budesonide  (PULMICORT ) 0.5 MG/2ML nebulizer solution Take 2 mLs by nebulization 2 (two) times daily.   05/09/2024 Morning   clopidogrel  (PLAVIX ) 75 MG tablet Take 75 mg by mouth daily.   05/09/2024 at  9:00 AM   dapagliflozin propanediol (FARXIGA) 10 MG TABS tablet Take 10 mg by mouth daily.   05/09/2024 Morning   donepezil  (ARICEPT ) 10 MG tablet Take 10 mg by mouth at bedtime.   05/08/2024   doxepin  (SINEQUAN ) 10 MG capsule Take 10-20 mg by mouth at bedtime.   05/08/2024   insulin  aspart (NOVOLOG  FLEXPEN) 100 UNIT/ML injection Inject 0-13 Units into the skin See admin instructions. Inject 0-13 units into the skin three times a day with meals, PER SLIDING SCALE   05/09/2024 Morning   iron  polysaccharides (NIFEREX) 150 MG capsule Take 150 mg by mouth 2 (  two) times daily.   05/09/2024 Morning   isosorbide  mononitrate (IMDUR ) 30 MG 24 hr tablet Take 30 mg by mouth daily.   05/09/2024 Morning   latanoprost  (XALATAN ) 0.005 % ophthalmic solution Place 1 drop into both eyes at bedtime.     05/08/2024   lidocaine  4 % Place 1 patch onto the skin daily. Apply to lower back.   Taking   losartan  (COZAAR ) 25 MG tablet Take 1 tablet (25 mg total) by mouth daily. 90 tablet 2 05/09/2024 Morning   memantine  (NAMENDA ) 10 MG tablet Take 10 mg by mouth 2 (two) times daily.   05/09/2024 Morning   metoprolol  succinate (TOPROL -XL) 50 MG 24 hr tablet Take 1 tablet (50 mg total) by mouth in the morning and at bedtime. Take with or immediately following a meal. 180 tablet 3 05/09/2024 at  9:00 AM   mirabegron  ER (MYRBETRIQ ) 50 MG  TB24 tablet Take 50 mg by mouth daily.   05/09/2024 Morning   montelukast  (SINGULAIR ) 10 MG tablet Take 10 mg by mouth at bedtime.   05/08/2024   Multiple Vitamin (MULTIVITAMIN WITH MINERALS) TABS tablet Take 1 tablet by mouth daily with breakfast.   05/09/2024 Morning   Multiple Vitamins-Minerals (PRESERVISION AREDS PO) Take 2 capsules by mouth in the morning.   05/09/2024 Morning   omeprazole (PRILOSEC) 40 MG capsule Take 1 capsule by mouth daily after supper.   05/08/2024   oxybutynin  (DITROPAN -XL) 10 MG 24 hr tablet Take 10 mg by mouth daily.   05/09/2024 Morning   OXYGEN Inhale 3 L into the lungs daily as needed (Shortness of breath, wheezing).   05/09/2024   sertraline  (ZOLOFT ) 100 MG tablet Take 100 mg by mouth daily.   05/09/2024 Morning   spironolactone  (ALDACTONE ) 25 MG tablet Take 25 mg by mouth daily.   05/09/2024 Morning   Teriparatide  560 MCG/2.24ML SOPN Inject 20 mcg into the skin at bedtime.   05/08/2024   torsemide  (DEMADEX ) 20 MG tablet Take 1 tablet (20 mg total) by mouth daily. (Patient taking differently: Take 20 mg by mouth 2 (two) times daily at 10 AM and 5 PM.)   05/09/2024 Morning   TRESIBA  FLEXTOUCH 100 UNIT/ML FlexTouch Pen Inject 40-46 Units into the skin See admin instructions. Inject 40 units into the skin in the morning and 46 units at bedtime   05/09/2024 Morning   WIXELA INHUB 250-50 MCG/ACT AEPB Inhale 2 puffs into the lungs in the morning.   05/09/2024 Morning   XARELTO  15 MG TABS tablet Take 15 mg by mouth daily with supper.   Taking   benzonatate  (TESSALON ) 200 MG capsule Take 1 capsule (200 mg total) by mouth 3 (three) times daily as needed for cough. (Patient not taking: Reported on 05/10/2024) 20 capsule 0 Not Taking   Scheduled:   atorvastatin   80 mg Oral QHS   bisacodyl  10 mg Rectal Once   budesonide   2 mL Nebulization BID   budesonide -formoterol   2 puff Inhalation BID   clopidogrel   75 mg Oral Daily   digoxin   0.125 mg Oral Daily   docusate sodium    100 mg Oral BID   donepezil   10 mg Oral QHS   furosemide   20 mg Intravenous Once   guaiFENesin   600 mg Oral BID   insulin  aspart  0-5 Units Subcutaneous QHS   insulin  aspart  0-6 Units Subcutaneous TID WC   insulin  glargine-yfgn  40 Units Subcutaneous q morning   iron  polysaccharides  150 mg Oral BID  isosorbide  mononitrate  30 mg Oral Daily   memantine   10 mg Oral BID   metoprolol  succinate  100 mg Oral BID   mirabegron  ER  50 mg Oral Daily   montelukast   10 mg Oral QHS   oxybutynin   10 mg Oral QHS   pantoprazole  (PROTONIX ) IV  40 mg Intravenous Q12H   sertraline   100 mg Oral Daily   sodium chloride  flush  3 mL Intravenous Q12H   sodium chloride  flush  3 mL Intravenous Q12H    Assessment: 73 YOM w/ PMH significant for AF on Xarelto  PTA [last known dose 11/27 1659] who needs heparin  therapy 2/2 need for EGD w/ esophageal dilation. No history noted for stroke, intracranial bleed, or GIB from review of chart. He had an ICD placed 03/01/2024  Goal of Therapy:  Heparin  level 0.3-0.7 units/ml aPTT 66-102 seconds Monitor platelets by anticoagulation protocol: Yes   Plan:  Start heparin  infusion at 1400 units/hr at 1700 Check heparin  level and aPTT in 8 hours and daily while on heparin  DC aPTT once correlates w/ HL Continue to monitor H&H and platelets  Romanda Turrubiates BS, PharmD, BCPS Clinical Pharmacist 05/13/2024 2:43 PM  Contact: [phone] after 3 PM

## 2024-05-13 NOTE — Consult Note (Signed)
 Referring Provider: Dr. Madelyne Primary Care Physician:  [name], [name], MD Primary Gastroenterologist:  Dr. Murriel at [facility]  Reason for Consultation: Esophageal dysphagia  HPI: Mike Hunter is a 73 y.o. male with a past medical history of bipolar disorder, dementia on Donepezil , hypertension, paroxysmal atrial fibrillation s/p cardioversion 08/2023 on Xarelto , s/p ICD placement, AS, coronary artery disease s/p DES, CHF withi LV EF 35 to 40%, left carotid artery stenosis s/p left carotid stent placement, OSA on CPAP, morbid obesity, COPD, chronic hypoxic respiratory failure on home oxygen [place], DM type II, CKD stage IIIb, chronic iron  deficiency anemia, GERD and esophageal stricture.  He presented to the ED 05/05/2024 with left-sided chest pain, shortness of breath and palpitations after he fell out of bed early that morning.  Noted to have a small abrasion to the left forehead, presumably hit his head when he fell.  Labs in the ED showed a WBC count of 10.6.  BNP 284.  Creatinine 1.87.  Normal LFTs.  EKG showed atrial flutter with heart rate 97.  Chest/abdominal/pelvic CT without acute intrathoracic or intra-abdominal/pelvic pathology.  Patchy dependent bilateral lower lobe consolidation consistent with atelectasis or airspace disease.  Head CT was negative.  Cervical spine CT without acute abnormality, stable appearing anterior fusion hardware from C5-C7.  He developed A-fib with RVR overnight on 11/27, started on Amiodarone  IV.  He had a low-grade fever with a persistent cough.  CT showed atelectasis versus infiltrates in the bases concerning for aspiration pneumonia.  Started on Unasyn  and Vancomycin .  A GI consult was requested for further evaluation regarding esophageal dysphagia with recurrent aspiration pneumonia.   He endorsed having chronic dysphagia which progressively worsened since admission, at risk for recurrent aspiration pneumonia.  He drink a few sips of water this  morning and stated he immediately aspirated with significant coughing.  He has a history of GERD with esophageal stricture, previously followed by Dr. Zulema then Dr. Murriel at [facility].  He is a challenging historian but believes his last EGD with esophageal dilatation was approximately 5 years ago.  He stated undergoing at least 10 EGDs with dilatation since age 75.  He describes having food or liquid which gets stuck to the upper esophagus which occurs daily at home.  He takes omeprazole 40 mg once daily, if he skips 1 dose he develops heartburn.  He denies having any upper abdominal pain.  He has intermittent lower abdominal pain which is chronic.  Stools are consistently black at home since starting oral iron  several years ago.  No bright red blood per the rectum.  Last BM was 4 5 days ago.  He believes his last colonoscopy was 10 years ago, results unknown.  He stated having constant chest pain since admission with persistent shortness of breath.  Swallow study per speech pathologist 05/11/2024, no evidence of oral dysphagia or aspiration.  Patient endorses having globus sensation.  Further GI evaluation recommended.  A barium swallow study was ordered per the hospitalist, not yet completed.  TEE/ECHO 03/03/2024: Left ventricular ejection fraction, by estimation, is 35 to 40%. The left ventricle has moderately decreased function. The left ventricle demonstrates global hypokinesis. Left ventricular diastolic function could not be evaluated. 1. Right ventricular systolic function is mildly reduced. The right ventricular size is normal. The estimated right ventricular systolic pressure is 35.0 mmHg. 2. Left atrial size was moderately dilated. No left atrial/left atrial appendage thrombus was detected. The LAA emptying velocity was 23 cm/s. 3. 4. Right  atrial size was mildly dilated. The mitral valve is normal in structure. Trivial mitral valve regurgitation. No evidence of mitral stenosis. 5.  Possibly a functionally bicuspid aortic valve. There is a calcified raphe between the left and noncoronary cusps. The aortic valve is abnormal. There is mild calcification of the aortic valve. There is mild thickening of the aortic valve. Aortic valve regurgitation is not visualized. Aortic valve sclerosis/calcification is present, without any evidence of aortic stenosis. 6. 7. There is Moderate (Grade III) plaque involving the descending aorta.   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   High cholesterol    History of methicillin resistant staphylococcus aureus (MRSA)    Hypertension    Reflux    Renal disorder    Sleep apnea     Past Surgical History:  Procedure Laterality Date   CARDIOVERSION N/A 08/26/2023   Procedure: CARDIOVERSION;  Surgeon: Barbaraann Darryle Ned, MD;  Location: [facility] INVASIVE CV LAB;  Service: Cardiovascular;  Laterality: N/A;   CARDIOVERSION N/A 02/26/2024   Procedure: CARDIOVERSION;  Surgeon: Lonni Slain, MD;  Location: [facility] INVASIVE CV LAB;  Service: Cardiovascular;  Laterality: N/A;   CARDIOVERSION N/A 03/04/2024   Procedure: CARDIOVERSION;  Surgeon: Francyne Headland, MD;  Location: [facility] INVASIVE CV LAB;  Service: Cardiovascular;  Laterality: N/A;   CATARACT EXTRACTION     CERVICAL FUSION     CHOLECYSTECTOMY     ICD IMPLANT N/A 03/01/2024   Procedure: ICD IMPLANT;  Surgeon: Kennyth Chew, MD;  Location: [facility] INVASIVE CV LAB;  Service: Cardiovascular;  Laterality: N/A;   KIDNEY STONE SURGERY     KNEE ARTHROSCOPY     TRANSESOPHAGEAL ECHOCARDIOGRAM (CATH LAB) N/A 03/04/2024   Procedure:  TRANSESOPHAGEAL ECHOCARDIOGRAM;  Surgeon: Francyne Headland, MD;  Location: [facility] INVASIVE CV LAB;  Service: Cardiovascular;  Laterality: N/A;    Prior to Admission medications   Medication Sig Start Date End Date Taking? Authorizing Provider  acetaminophen  (TYLENOL ) 650 MG CR tablet Take 1,300 mg by mouth every 8 (eight) hours as needed for pain.   Yes [provider]  albuterol  (PROVENTIL  HFA;VENTOLIN  HFA) 108 (90 BASE) MCG/ACT inhaler Inhale 2 puffs into the lungs every 4 (four) hours as needed for wheezing or shortness of breath.   Yes [provider]  amiodarone  (PACERONE ) 200 MG tablet Take 1 tablet (200 mg total) by mouth 2 (two) times daily. 03/09/24  Yes Patsy Lenis, MD  atorvastatin  (LIPITOR ) 80 MG tablet Take 80 mg by mouth at bedtime.   Yes [provider]  azithromycin  (ZITHROMAX ) 250 MG tablet Take 250 mg by mouth every Monday, Wednesday, and Friday. 02/23/24  Yes [provider]  budesonide  (PULMICORT ) 0.5 MG/2ML nebulizer solution Take 2 mLs by nebulization 2 (two) times daily.   Yes [provider]  clopidogrel  (PLAVIX ) 75 MG tablet Take 75 mg by mouth daily. 05/18/23 05/17/24 Yes [provider]  dapagliflozin propanediol (FARXIGA) 10 MG TABS tablet Take 10 mg by mouth daily.   Yes [provider]  donepezil  (ARICEPT ) 10 MG tablet Take 10 mg  by mouth at bedtime.   Yes [provider]  doxepin  (SINEQUAN ) 10 MG capsule Take 10-20 mg by mouth at bedtime.   Yes [provider]  insulin  aspart (NOVOLOG  FLEXPEN) 100 UNIT/ML injection Inject 0-13 Units into the skin See admin instructions. Inject 0-13 units into the skin three times a day with meals, PER SLIDING SCALE   Yes [provider]  iron  polysaccharides (NIFEREX) 150 MG capsule Take 150 mg by mouth 2 (two) times daily. 02/25/24  Yes [provider]  isosorbide  mononitrate (IMDUR ) 30 MG 24 hr tablet Take 30 mg by mouth daily.   Yes [provider]  latanoprost  (XALATAN ) 0.005 % ophthalmic solution Place 1 drop into both eyes at bedtime.     Yes [provider]  lidocaine  4 % Place 1 patch onto the skin daily. Apply to lower back.   Yes [provider]  losartan  (COZAAR ) 25 MG tablet Take 1 tablet (25 mg total) by mouth daily. 03/09/24  Yes Patsy Lenis, MD  memantine  (NAMENDA ) 10 MG tablet Take 10 mg by mouth 2 (two) times daily.   Yes [provider]  metoprolol  succinate (TOPROL -XL) 50 MG 24 hr tablet Take 1 tablet (50 mg total) by mouth in the morning and at bedtime. Take with or immediately following a meal. 04/11/24  Yes Almetta Donnice LABOR, MD  mirabegron  ER (MYRBETRIQ ) 50 MG TB24 tablet Take 50 mg by mouth daily. 02/09/24 02/03/25 Yes [provider]  montelukast  (SINGULAIR ) 10 MG tablet Take 10 mg by mouth at bedtime.   Yes [provider]  Multiple Vitamin (MULTIVITAMIN WITH MINERALS) TABS tablet Take 1 tablet by mouth daily with breakfast.   Yes [provider]  Multiple Vitamins-Minerals (PRESERVISION AREDS PO) Take 2 capsules by mouth in the morning.   Yes [provider]  omeprazole (PRILOSEC) 40 MG capsule Take 1 capsule by mouth daily after supper. 02/02/17  Yes [provider]  oxybutynin  (DITROPAN -XL) 10 MG 24 hr tablet Take 10 mg by mouth daily.   Yes [provider]  OXYGEN Inhale 3 L into the lungs daily as needed (Shortness of breath, wheezing).   Yes [provider]  sertraline  (ZOLOFT ) 100 MG tablet Take 100 mg by mouth daily.   Yes [provider]  spironolactone  (ALDACTONE ) 25 MG tablet Take 25 mg by mouth daily.   Yes [provider]  Teriparatide  560 MCG/2.24ML SOPN Inject 20 mcg into the skin at bedtime. 02/25/24  Yes [provider]  torsemide  (DEMADEX ) 20 MG tablet Take 1 tablet (20 mg total) by mouth daily. Patient taking differently: Take 20 mg by mouth 2 (two) times daily at 10 AM  and 5 PM. 03/09/24  Yes Patsy Lenis, MD  TRESIBA  FLEXTOUCH 100 UNIT/ML FlexTouch Pen Inject 40-46 Units into the skin See admin instructions. Inject 40 units into the skin in the morning and 46 units at bedtime   Yes [provider]  NAPOLEON INHUB 250-50 MCG/ACT AEPB Inhale 2 puffs into the lungs in the morning. 04/20/23  Yes [provider]  XARELTO  15 MG TABS tablet Take 15 mg by mouth daily with supper. 03/08/24  Yes [provider]  benzonatate  (TESSALON ) 200 MG capsule Take 1 capsule (200 mg total) by mouth 3 (three) times daily as needed for cough. Patient not taking: Reported on 05/10/2024 04/07/24   Barbarann Nest, MD    Current Facility-Administered Medications  Medication Dose Route Frequency Provider Last Rate Last Admin  acetaminophen  (TYLENOL ) tablet 650 mg  650 mg Oral Q6H PRN Sundil, Subrina, MD   650 mg at 05/12/24 0454   Or   acetaminophen  (TYLENOL ) suppository 650 mg  650 mg Rectal Q6H PRN Sundil, Subrina, MD       amiodarone  (NEXTERONE  PREMIX) 360-4.14 MG/200ML-% (1.8 mg/mL) IV infusion  30 mg/hr Intravenous Continuous Franky Redia SAILOR, MD 16.67 mL/hr at 05/13/24 0641 30 mg/hr at 05/13/24 0641   Ampicillin -Sulbactam (UNASYN ) 3 g in sodium chloride  0.9 % 100 mL IVPB  3 g Intravenous Q6H Hammons, Kimberly B, RPH 200 mL/hr at 05/13/24 0615 3 g at 05/13/24 0615   atorvastatin  (LIPITOR ) tablet 80 mg  80 mg Oral QHS Sundil, Subrina, MD   80 mg at 05/12/24 2116   bisacodyl  (DULCOLAX) suppository 10 mg  10 mg Rectal Once Regalado, Belkys A, MD       budesonide  (PULMICORT ) nebulizer solution 0.5 mg  2 mL Nebulization BID Sundil, Subrina, MD   0.5 mg at 05/13/24 9248   budesonide -formoterol  (SYMBICORT ) 160-4.5 MCG/ACT inhaler 2 puff  2 puff Inhalation BID Sundil, Subrina, MD   2 puff at 05/13/24 0800   clopidogrel  (PLAVIX ) tablet 75 mg  75 mg Oral Daily Sundil, Subrina, MD   75 mg at 05/12/24 0857   digoxin  (LANOXIN ) tablet 0.125 mg  0.125 mg Oral Daily  Lesia Ozell Barter, PA-C       docusate sodium  (COLACE) capsule 100 mg  100 mg Oral BID Sundil, Subrina, MD   100 mg at 05/12/24 2116   donepezil  (ARICEPT ) tablet 10 mg  10 mg Oral QHS Sundil, Subrina, MD   10 mg at 05/12/24 2117   guaiFENesin  (MUCINEX ) 12 hr tablet 600 mg  600 mg Oral BID Sundil, Subrina, MD   600 mg at 05/12/24 2116   insulin  aspart (novoLOG ) injection 0-5 Units  0-5 Units Subcutaneous QHS Sundil, Subrina, MD   3 Units at 05/12/24 2234   insulin  aspart (novoLOG ) injection 0-6 Units  0-6 Units Subcutaneous TID WC Sundil, Subrina, MD   2 Units at 05/13/24 9371   insulin  glargine-yfgn (SEMGLEE ) injection 40 Units  40 Units Subcutaneous q morning Sundil, Subrina, MD   40 Units at 05/12/24 0909   iron  polysaccharides (NIFEREX) capsule 150 mg  150 mg Oral BID Sundil, Subrina, MD   150 mg at 05/12/24 2116   isosorbide  mononitrate (IMDUR ) 24 hr tablet 30 mg  30 mg Oral Daily Sundil, Subrina, MD   30 mg at 05/12/24 0858   levalbuterol  (XOPENEX ) nebulizer solution 0.63 mg  0.63 mg Nebulization Q6H PRN Sundil, Subrina, MD   0.63 mg at 05/13/24 1039   memantine  (NAMENDA ) tablet 10 mg  10 mg Oral BID Sundil, Subrina, MD   10 mg at 05/12/24 2116   metoprolol  succinate (TOPROL -XL) 24 hr tablet 100 mg  100 mg Oral BID Lesia Ozell Barter, PA-C   100 mg at 05/12/24 2117   mirabegron  ER (MYRBETRIQ ) tablet 50 mg  50 mg Oral Daily Sundil, Subrina, MD   50 mg at 05/12/24 0909   montelukast  (SINGULAIR ) tablet 10 mg  10 mg Oral QHS Sundil, Subrina, MD   10 mg at 05/12/24 2116   nitroGLYCERIN  (NITROSTAT ) SL tablet 0.4 mg  0.4 mg Sublingual Q5 min PRN Vicci Rollo SAUNDERS, PA-C   0.4 mg at 05/12/24 1712   ondansetron  (ZOFRAN ) injection 4 mg  4 mg Intravenous Q6H PRN Regalado, Belkys A, MD   4 mg at 05/12/24 1600   oxybutynin  (DITROPAN -XL) 24 hr  tablet 10 mg  10 mg Oral QHS Sundil, Subrina, MD   10 mg at 05/12/24 2119   pantoprazole  (PROTONIX ) injection 40 mg  40 mg Intravenous Q12H Regalado,  Belkys A, MD   40 mg at 05/12/24 2117   rivaroxaban  (XARELTO ) tablet 20 mg  20 mg Oral Q supper Tillery, Michael Andrew, PA-C   20 mg at 05/12/24 1658   sertraline  (ZOLOFT ) tablet 100 mg  100 mg Oral Daily Rashid, Farhan, MD   100 mg at 05/12/24 0858   sodium chloride  flush (NS) 0.9 % injection 3 mL  3 mL Intravenous Q12H Sundil, Subrina, MD   3 mL at 05/12/24 0855   sodium chloride  flush (NS) 0.9 % injection 3 mL  3 mL Intravenous Q12H Sundil, Subrina, MD   3 mL at 05/12/24 0855   sodium chloride  flush (NS) 0.9 % injection 3 mL  3 mL Intravenous PRN Sundil, Subrina, MD       traMADol  (ULTRAM ) tablet 25 mg  25 mg Oral Q6H PRN Regalado, Belkys A, MD   25 mg at 05/11/24 1638   vancomycin  (VANCOREADY) IVPB 750 mg/150 mL  750 mg Intravenous Q12H Ygnacio Limerick, RPH 150 mL/hr at 05/13/24 0922 750 mg at 05/13/24 9077    Allergies as of 05/09/2024 - Review Complete 05/09/2024  Allergen Reaction Noted   Buprenorphine hcl Nausea And Vomiting 03/17/2011   Hydromorphone  Other (See Comments) 10/14/2011   Codeine Nausea And Vomiting 03/17/2011   Mirtazapine Other (See Comments) 10/14/2011   Morphine  and codeine Nausea And Vomiting 03/17/2011    History reviewed. No pertinent family history.  Social History   Socioeconomic History   Marital status: Married    Spouse name: Not on file   Number of children: Not on file   Years of education: Not on file   Highest education level: Not on file  Occupational History   Not on file  Tobacco Use   Smoking status: Former   Smokeless tobacco: Never  Vaping Use   Vaping status: Never Used  Substance and Sexual Activity   Alcohol use: No   Drug use: No   Sexual activity: Not on file  Other Topics Concern   Not on file  Social History Narrative   Not on file   Social Drivers of Health   Financial Resource Strain: Not on file  Food Insecurity: No Food Insecurity (05/10/2024)   Hunger Vital Sign    Worried About Running Out of Food in the Last  Year: Never true    Ran Out of Food in the Last Year: Never true  Transportation Needs: No Transportation Needs (05/10/2024)   PRAPARE - Administrator, Civil Service (Medical): No    Lack of Transportation (Non-Medical): No  Physical Activity: Not on file  Stress: Not on file  Social Connections: Socially Integrated (05/10/2024)   Social Connection and Isolation Panel    Frequency of Communication with Friends and Family: Three times a week    Frequency of Social Gatherings with Friends and Family: Three times a week    Attends Religious Services: More than 4 times per year    Active Member of Clubs or Organizations: Yes    Attends Banker Meetings: 1 to 4 times per year    Marital Status: Married  Catering Manager Violence: Not At Risk (05/10/2024)   Humiliation, Afraid, Rape, and Kick questionnaire    Fear of Current or Ex-Partner: No    Emotionally Abused: No  Physically Abused: No    Sexually Abused: No    Review of Systems: Gen: Denies fever, sweats or chills. No weight loss.  CV: Denies chest pain, palpitations or edema. Resp: Denies cough, shortness of breath of hemoptysis.  GI: See HPI.   GU: + Urinary incontinence. MS: + Generalized weakness. Derm: Denies rash, itchiness, skin lesions or unhealing ulcers. Psych: + Depression. Heme: Denies easy bruising, bleeding. Neuro:  Denies headaches, dizziness or paresthesias. Endo:  + DM type II.  Physical Exam: Vital signs in last 24 hours: Temp:  [97.5 F (36.4 C)-98.4 F (36.9 C)] 98 F (36.7 C) (11/28 0700) Pulse Rate:  [99-139] 109 (11/28 0700) Resp:  [20-26] 26 (11/28 0700) BP: (93-125)/(58-85) 115/73 (11/28 0700) SpO2:  [94 %-100 %] 98 % (11/28 1030) Last BM Date : 05/09/24 General: Alert chronically ill-appearing, obese 73 year old male sitting up in the chair in no acute distress. Head:  Normocephalic and atraumatic. Eyes:  No scleral icterus. Conjunctiva pink. Ears:  Normal  auditory acuity. Nose:  No deformity, discharge or lesions. Mouth: Poor dentition.  No ulcers or lesions.  Neck:  Supple. No lymphadenopathy or thyromegaly.  Lungs: Breath sounds diminished throughout with crackles in the bases.  On oxygen 2 L nasal cannula. Heart: Irregular rhythm, no murmur. Abdomen: Obese abdomen, soft bowel sounds to all 4 quadrants.  No palpable mass. Rectal: Deferred. Musculoskeletal:  Symmetrical without gross deformities.  Pulses:  Normal pulses noted. Extremities:  Without clubbing or edema. Neurologic:  Alert and  oriented x 4. No focal deficits.  Skin:  Intact without significant lesions or rashes. Psych:  Alert and cooperative. Normal mood and affect.  Intake/Output from previous day: 11/27 0701 - 11/28 0700 In: 120 [P.O.:120] Out: 3035 [Urine:3035] Intake/Output this shift: Total I/O In: 240 [P.O.:240] Out: -   Lab Results: Recent Labs    05/10/24 2324 05/12/24 0230  WBC 16.2* 14.5*  HGB 9.9* 8.7*  HCT 34.3* 29.7*  PLT 358 275   BMET Recent Labs    05/10/24 2324 05/12/24 0230  NA 140 136  K 3.9 3.8  CL 105 105  CO2 25 23  GLUCOSE 208* 171*  BUN 19 16  CREATININE 1.57* 1.32*  CALCIUM  8.6* 8.3*   LFT No results for input(s): PROT, ALBUMIN , AST, ALT, ALKPHOS, BILITOT, BILIDIR, IBILI in the last 72 hours. PT/INR No results for input(s): LABPROT, INR in the last 72 hours. Hepatitis Panel No results for input(s): HEPBSAG, HCVAB, HEPAIGM, HEPBIGM in the last 72 hours.    Studies/Results: DG CHEST PORT 1 VIEW Result Date: 05/13/2024 CLINICAL DATA:  Dyspnea. EXAM: PORTABLE CHEST 1 VIEW COMPARISON:  05/10/2024 FINDINGS: Stable cardiomegaly. AICD again seen. Low lung volumes again noted. Stable pulmonary vascular congestion. New mild airspace opacity is seen in the right upper lobe, suspicious for pneumonia. IMPRESSION: New mild right upper lobe airspace opacity, suspicious for pneumonia. Electronically Signed    By: Norleen DELENA Kil M.D.   On: 05/13/2024 10:05    IMPRESSION/PLAN:  Aspiration pneumonia.  WBC 14,5 -> 16. On IV Unasyn  and Vancomycin .  SARS coronavirus testing ordered, not yet completed. - Management per the hospitalist  GERD with chronic dysphagia. Swallow study by speech pathology without evidence of oral dysphagia or evidence of aspiration.  Barium swallow study ordered by the hospitalist, not yet completed. - EGD with esophageal dilatation deferred until cardiac and respiratory status is stabilized, and Plavix  will need to be held for 5 days and Xarelto  for 2 days if esophageal dilatation pursued.  Patient is at high risk.  Await further recommendations per Dr. Leigh. - Continue Pantoprazole  40 mg IV twice daily - NPO for now, await barium swallow study result  AKI on CKD  Chronic anemia. Hg 9.9 -> 8.9. - Transfuse for Hg level < 8 - CBC in am  Paroxysmal atrial fibrillation on Xarelto , developed A-fib with RVR 11/27. On Amiodarone  IV.  Left artery stenosis s/p stent placement on Xarelto  and Plavix    CAD s/p DES on Plavix    Chronic systolic CHF. LV EF 35%.  DM type II    Elida CHRISTELLA Shawl  05/13/2024, 12:08 PM

## 2024-05-13 NOTE — Progress Notes (Addendum)
 Patient Name: Mike Hunter Date of Encounter: 05/13/2024  Primary Cardiologist: Jerel Balding, MD Electrophysiologist: [name], MD  Interval Summary   Feeling poorly this am.  Got choked up while drinking and feels like he has continued to aspirate.   Vital Signs    Vitals:   05/12/24 2332 05/13/24 0000 05/13/24 0300 05/13/24 0700  BP: 113/84 119/84 119/85 115/73  Pulse: (!) 134  99 (!) 109  Resp: (!) 24 (!) 22 20 (!) 26  Temp: (!) 97.5 F (36.4 C)  98.4 F (36.9 C) 98 F (36.7 C)  TempSrc: Oral  Oral Oral  SpO2: 99%  100% 98%  Weight:        Intake/Output Summary (Last 24 hours) at 05/13/2024 0800 Last data filed at 05/13/2024 0800 Gross per 24 hour  Intake 360 ml  Output 3035 ml  Net -2675 ml   Filed Weights   05/09/24 2300  Weight: 125.9 kg    Physical Exam    GEN- NAD, Alert and oriented  Lungs- Clear to ausculation bilaterally, normal work of breathing Cardiac- Irregularly irregular rate and rhythm, no murmurs, rubs or gallops GI- soft, NT, ND, + BS Extremities- no clubbing or cyanosis. No edema  Telemetry    AF rates 90-130s (personally reviewed)  Hospital Course    Mike Hunter is a 73 y.o. male with a history of CAD s/p LAD PCI (10/2023), HFrEF/ICM, sustained VT s/p 2' prevention LEFT BSCI DC ICD (DOI 03/01/24), PVD s/p L ICA (05/2023), HTN, HLD, COPD, OSA on CPAP, neuropathy, DM2, BPD, CKD3, and HPpEF who is being seen today for the evaluation of AF/AFL RVR at the request of Dr. Madelyne.  Assessment & Plan    AF with RVR AFL with RVR Continue IV amiodarone  for now Continue Xarelto  - missed dose 11/24, will need TEE if ablation occurs prior to 3-4 weeks.  Continue toprol  at 100 mg BID Consider digoxin .  Will need infectious process to resolve prior to EP procedure.  May ultimately need AVN ablation + CRT upgrade given his functional status He is currently a poor candidate for EP procedures with his on-going aspiration,  tachypnea, and likely aspiration PNA.    Chronic systolic CHF EF 35% by cMRI 02/29/2024  Nausea/Lack of appetite Malaise Elevated WBC ? Aspiration PNA No further fever ABx per primary Continues to aspirate, getting choked up with fluids.  Primary considering GI consultation.   For questions or updates, please contact [facility] Please consult [email or web address] for contact info under     Signed, Mike Prentice Passey, PA-C  05/13/2024, 8:00 AM    I have seen, examined the patient, and reviewed the above assessment and plan.    Interval:  Had another aspiration event this morning.   General: Chronically ill appearing, in no acute distress.  Neck: No JVD.  Cardiac: Tachycardic, irregular rhythm.  Resp: Normal work of breathing.  Ext: No edema.  Neuro: No gross focal deficits.  Psych: Normal affect.   Assessment:  Mike Hunter is a 73 y.o. 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    Unfortunately, found to have likely aspiration pneumonia with fever and leukocytosis.   Problem List:  AF/RVR AFL Sustained VT s/p LEFT BSCI DC ICD (DOI 03/01/24)  HFrEF CAD s/p  LAD PCI  Aspiration pneumonia   Plan: -Continue amiodarone  for now. Continue metoprolol  XL 100mg  BID.  Add digoxin  for improved rate control.  -Continue Xarelto  for now.  -Will need infectious issues to resolve prior to any EP procedure. Ultimately, likely to have AVN ablation and upgrade to CRT, hopefully prior to discharge, given his poor functional status and co-morbidities. Will need to be able to lay flat for procedure.    Fonda Kitty, MD 05/13/2024 10:02 AM

## 2024-05-13 NOTE — Progress Notes (Addendum)
 Modified Barium Swallow Study  Patient Details  Name: Mike Hunter MRN: [id number] Date of Birth: Apr 12, 1951  Today's Date: 05/13/2024  Modified Barium Swallow completed.  Full report located under Chart Review in the Imaging Section.  History of Present Illness 73 yo male presenting to ED after falling out of bed and ICD firing. Admitted with AKI superimposed on CKD and A-fib with RVR, initially scheduled for an OP ablation 12/1 (cardiology following). CT Chest shows patchy dependent bilateral lower lobe consolidation, favor atelectasis over airspace disease. CXR 11/28 shows new mild RUL opacity, suspicious for pneumonia. Most recent SLP visit July 2025 with functional-appearing oropharyngeal swallow clinically and recommendations to f/u for an esophagram. FEES 06/04/23 also WFL. PMH: COPD, paroxysmal A-fib s/p pacemaker on Xarelto , HF reduced EF 35-40%, CKD 3B, C5-C7 fusion surgery with hardware placement, HLD, dementia, T2DM   Clinical Impression Pt exhibits functional oropharyngeal swallowing. Trace, transient penetration was observed with thin liquids (PAS 2) but no further penetration/aspiration occurred even when challenged with large volumes. Residue along the BOT, valleculae, and pyriform sinues is overall trace. Coughing was observed on multiple occasions without airway invasion or a significant collection of pharyngeal residue. Retention was not noted during the esophageal sweep when given the 13 mm barium tablet (difficult to fully visualize due to body habitus) but pt immediately endorsed globus sensation and heartburn. Could consider more dedicated esophageal assessment (esophagram ordered per attending MD and GI). Will f/u at least briefly for education but do not anticipate post-acute SLP needs. Factors that may increase risk of adverse event in presence of aspiration Mike Hunter & Mike Hunter 2021): Poor general health and/or compromised immunity;Limited mobility  Swallow Evaluation  Recommendations Recommendations: PO diet PO Diet Recommendation: Regular;Thin liquids (Level 0) Liquid Administration via: Cup;Straw Medication Administration: Whole meds with liquid Supervision: Patient able to self-feed;Intermittent supervision/cueing for swallowing strategies Swallowing strategies  : Minimize environmental distractions;Slow rate;Small bites/sips Postural changes: Position pt fully upright for meals;Stay upright 30-60 min after meals Oral care recommendations: Oral care BID (2x/day) Recommended consults: Consider esophageal assessment   Mike Hunter, M.A., CCC-SLP Speech Language Pathology, Acute Rehabilitation Services  Secure Chat preferred [phone]  05/13/2024,2:34 PM

## 2024-05-14 DIAGNOSIS — Z95810 Presence of automatic (implantable) cardiac defibrillator: Secondary | ICD-10-CM

## 2024-05-14 DIAGNOSIS — N179 Acute kidney failure, unspecified: Secondary | ICD-10-CM | POA: Diagnosis not present

## 2024-05-14 DIAGNOSIS — I5022 Chronic systolic (congestive) heart failure: Secondary | ICD-10-CM

## 2024-05-14 DIAGNOSIS — N189 Chronic kidney disease, unspecified: Secondary | ICD-10-CM | POA: Diagnosis not present

## 2024-05-14 DIAGNOSIS — I4819 Other persistent atrial fibrillation: Secondary | ICD-10-CM

## 2024-05-14 LAB — CBC
HCT: 27.2 % — ABNORMAL LOW (ref 39.0–52.0)
Hemoglobin: 7.9 g/dL — ABNORMAL LOW (ref 13.0–17.0)
MCH: 21.6 pg — ABNORMAL LOW (ref 26.0–34.0)
MCHC: 29 g/dL — ABNORMAL LOW (ref 30.0–36.0)
MCV: 74.5 fL — ABNORMAL LOW (ref 80.0–100.0)
Platelets: 328 K/uL (ref 150–400)
RBC: 3.65 MIL/uL — ABNORMAL LOW (ref 4.22–5.81)
RDW: 18.4 % — ABNORMAL HIGH (ref 11.5–15.5)
WBC: 12.4 K/uL — ABNORMAL HIGH (ref 4.0–10.5)
nRBC: 0 % (ref 0.0–0.2)

## 2024-05-14 LAB — APTT
aPTT: 62 s — ABNORMAL HIGH (ref 24–36)
aPTT: 44 s — ABNORMAL HIGH (ref 24–36)
aPTT: 68 s — ABNORMAL HIGH (ref 24–36)

## 2024-05-14 LAB — BASIC METABOLIC PANEL WITH GFR
Anion gap: 10 (ref 5–15)
BUN: 16 mg/dL (ref 8–23)
CO2: 21 mmol/L — ABNORMAL LOW (ref 22–32)
Calcium: 8 mg/dL — ABNORMAL LOW (ref 8.9–10.3)
Chloride: 107 mmol/L (ref 98–111)
Creatinine, Ser: 1.25 mg/dL — ABNORMAL HIGH (ref 0.61–1.24)
GFR, Estimated: >60 mL/min (ref >60)
Glucose, Bld: 167 mg/dL — ABNORMAL HIGH (ref 70–99)
Potassium: 3.2 mmol/L — ABNORMAL LOW (ref 3.5–5.1)
Sodium: 138 mmol/L (ref 135–145)

## 2024-05-14 LAB — GLUCOSE, CAPILLARY
Glucose-Capillary: 146 mg/dL — ABNORMAL HIGH (ref 70–99)
Glucose-Capillary: 268 mg/dL — ABNORMAL HIGH (ref 70–99)
Glucose-Capillary: 299 mg/dL — ABNORMAL HIGH (ref 70–99)
Glucose-Capillary: 172 mg/dL — ABNORMAL HIGH (ref 70–99)

## 2024-05-14 LAB — HEPARIN LEVEL (UNFRACTIONATED)
Heparin Unfractionated: 0.64 [IU]/mL (ref 0.30–0.70)
Heparin Unfractionated: 0.3 [IU]/mL (ref 0.30–0.70)
Heparin Unfractionated: 0.32 [IU]/mL (ref 0.30–0.70)

## 2024-05-14 MED ORDER — GUAIFENESIN ER 600 MG PO TB12
1200.0000 mg | ORAL_TABLET | Freq: Two times a day (BID) | ORAL | Status: DC
  Administered 2024-05-14 – 2024-05-21 (×13): 1200 mg via ORAL
  Filled 2024-05-14 (×13): qty 2

## 2024-05-14 MED ORDER — VANCOMYCIN HCL 750 MG/150ML IV SOLN
750.0000 mg | Freq: Two times a day (BID) | INTRAVENOUS | Status: DC
  Administered 2024-05-14 – 2024-05-15 (×3): 750 mg via INTRAVENOUS
  Filled 2024-05-14 (×4): qty 150

## 2024-05-14 MED ORDER — BOOST / RESOURCE BREEZE PO LIQD CUSTOM
1.0000 | Freq: Two times a day (BID) | ORAL | Status: DC
  Administered 2024-05-14 – 2024-05-21 (×10): 1 via ORAL

## 2024-05-14 MED ORDER — POTASSIUM CHLORIDE 10 MEQ/100ML IV SOLN
10.0000 meq | INTRAVENOUS | Status: AC
  Administered 2024-05-14 (×2): 10 meq via INTRAVENOUS
  Filled 2024-05-14 (×2): qty 100

## 2024-05-14 NOTE — Plan of Care (Signed)
   Problem: Coping: Goal: Ability to adjust to condition or change in health will improve Outcome: Progressing   Problem: Fluid Volume: Goal: Ability to maintain a balanced intake and output will improve Outcome: Progressing   Problem: Health Behavior/Discharge Planning: Goal: Ability to identify and utilize available resources and services will improve Outcome: Progressing

## 2024-05-14 NOTE — Progress Notes (Signed)
 PHARMACY - ANTICOAGULATION CONSULT NOTE  Pharmacy Consult for heparin  (transition from Xarelto ) Indication: atrial fibrillation  Allergies  Allergen Reactions   Buprenorphine Hcl Nausea And Vomiting   Hydromorphone  Other (See Comments)    Mental Status Changes/HALLUCINATIONS   Mirtazapine Other (See Comments)    Nightmares & altered mental status    Codeine Nausea And Vomiting   Morphine  And Codeine Nausea And Vomiting    Patient Measurements: Height: 5' 10 (177.8 cm) Weight: 125.9 kg (277 lb 9 oz) IBW/kg (Calculated) : 73 HEPARIN  DW (KG): 101.6  Vital Signs: Temp: 98.6 F (37 C) (11/29 1100) Temp Source: Oral (11/29 1100) BP: 106/71 (11/29 1100) Pulse Rate: 76 (11/29 1100)  Labs: Recent Labs    05/11/24 1322 05/12/24 0230 05/12/24 0230 05/13/24 1015 05/14/24 0221 05/14/24 1120  HGB  --  8.7*   < > 8.9* 7.9*  --   HCT  --  29.7*  --  31.1* 27.2*  --   PLT  --  275  --  344 328  --   APTT  --   --   --   --  62* 44*  HEPARINUNFRC  --   --   --   --  0.64 0.30  CREATININE  --  1.32*  --  <0.30* 1.25*  --   TROPONINIHS 9  --   --   --   --   --    < > = values in this interval not displayed.    Estimated Creatinine Clearance: 68 mL/min (A) (by C-G formula based on SCr of 1.25 mg/dL (H)).   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   05/08/2024   albuterol  (PROVENTIL  HFA;VENTOLIN  HFA) 108 (90 BASE) MCG/ACT inhaler Inhale 2 puffs into the lungs every 4 (four) hours as needed for wheezing or shortness of breath.   05/09/2024 Morning   amiodarone  (PACERONE ) 200 MG tablet Take 1 tablet (200 mg total) by mouth 2 (two) times daily. 60 tablet 3 05/09/2024 Morning   atorvastatin  (LIPITOR ) 80 MG tablet Take 80 mg by mouth at bedtime.   05/08/2024   azithromycin  (ZITHROMAX ) 250 MG tablet Take 250 mg by mouth every Monday, Wednesday, and Friday.   05/09/2024 Morning   budesonide  (PULMICORT ) 0.5 MG/2ML nebulizer solution Take 2 mLs by nebulization 2 (two) times daily.   05/09/2024 Morning   clopidogrel  (PLAVIX ) 75 MG tablet Take 75 mg by mouth daily.   05/09/2024 at  9:00 AM   dapagliflozin propanediol (FARXIGA) 10 MG TABS tablet Take 10 mg by mouth daily.   05/09/2024 Morning   donepezil  (ARICEPT ) 10 MG tablet Take 10 mg by mouth at bedtime.   05/08/2024   doxepin  (SINEQUAN ) 10 MG capsule Take  10-20 mg by mouth at bedtime.   05/08/2024   insulin  aspart (NOVOLOG  FLEXPEN) 100 UNIT/ML injection Inject 0-13 Units into the skin See admin instructions. Inject 0-13 units into the skin three times a day with meals, PER SLIDING SCALE   05/09/2024 Morning   iron  polysaccharides (NIFEREX) 150 MG capsule Take 150 mg by mouth 2 (two) times daily.   05/09/2024 Morning   isosorbide  mononitrate (IMDUR ) 30 MG 24 hr tablet Take 30 mg by mouth daily.   05/09/2024 Morning   latanoprost  (XALATAN ) 0.005 % ophthalmic solution Place 1 drop into both eyes at bedtime.     05/08/2024   lidocaine  4 % Place 1 patch onto the skin daily. Apply to lower back.   Taking   losartan  (COZAAR ) 25 MG tablet Take 1 tablet (25 mg total) by mouth daily. 90 tablet 2 05/09/2024 Morning   memantine  (NAMENDA ) 10 MG tablet Take 10 mg by mouth 2 (two) times daily.   05/09/2024 Morning   metoprolol  succinate (TOPROL -XL) 50 MG 24 hr tablet Take 1 tablet (50 mg total) by  mouth in the morning and at bedtime. Take with or immediately following a meal. 180 tablet 3 05/09/2024 at  9:00 AM   mirabegron  ER (MYRBETRIQ ) 50 MG TB24 tablet Take 50 mg by mouth daily.   05/09/2024 Morning   montelukast  (SINGULAIR ) 10 MG tablet Take 10 mg by mouth at bedtime.   05/08/2024   Multiple Vitamin (MULTIVITAMIN WITH MINERALS) TABS tablet Take 1 tablet by mouth daily with breakfast.   05/09/2024 Morning   Multiple Vitamins-Minerals (PRESERVISION AREDS PO) Take 2 capsules by mouth in the morning.   05/09/2024 Morning   omeprazole (PRILOSEC) 40 MG capsule Take 1 capsule by mouth daily after supper.   05/08/2024   oxybutynin  (DITROPAN -XL) 10 MG 24 hr tablet Take 10 mg by mouth daily.   05/09/2024 Morning   OXYGEN Inhale 3 L into the lungs daily as needed (Shortness of breath, wheezing).   05/09/2024   sertraline  (ZOLOFT ) 100 MG tablet Take 100 mg by mouth daily.   05/09/2024 Morning   spironolactone  (ALDACTONE ) 25 MG tablet Take 25 mg by mouth daily.   05/09/2024 Morning   Teriparatide  560 MCG/2.24ML SOPN Inject 20 mcg into the skin at bedtime.   05/08/2024   torsemide  (DEMADEX ) 20 MG tablet Take 1 tablet (20 mg total) by mouth daily. (Patient taking differently: Take 20 mg by mouth 2 (two) times daily at 10 AM and 5 PM.)   05/09/2024 Morning   TRESIBA  FLEXTOUCH 100 UNIT/ML FlexTouch Pen Inject 40-46 Units into the skin See admin instructions. Inject 40 units into the skin in the morning and 46 units at bedtime   05/09/2024 Morning   WIXELA INHUB 250-50 MCG/ACT AEPB Inhale 2 puffs into the lungs in the morning.   05/09/2024 Morning   XARELTO  15 MG TABS tablet Take 15 mg by mouth daily with supper.   Taking   benzonatate  (TESSALON ) 200 MG capsule Take 1 capsule (200 mg total) by mouth 3 (three) times daily as needed for cough. (Patient not taking: Reported on 05/10/2024) 20 capsule 0 Not Taking   Scheduled:   atorvastatin   80 mg Oral QHS   budesonide   2 mL Nebulization BID    budesonide -formoterol   2 puff Inhalation BID   clopidogrel   75 mg Oral Daily   digoxin   0.125 mg Oral Daily   docusate sodium   100 mg Oral BID   donepezil   10 mg Oral QHS   feeding  supplement  1 Container Oral BID BM   guaiFENesin   1,200 mg Oral BID   insulin  aspart  0-5 Units Subcutaneous QHS   insulin  aspart  0-6 Units Subcutaneous TID WC   insulin  glargine-yfgn  40 Units Subcutaneous q morning   iron  polysaccharides  150 mg Oral BID   isosorbide  mononitrate  30 mg Oral Daily   memantine   10 mg Oral BID   metoprolol  succinate  100 mg Oral BID   mirabegron  ER  50 mg Oral Daily   montelukast   10 mg Oral QHS   oxybutynin   10 mg Oral QHS   pantoprazole  (PROTONIX ) IV  40 mg Intravenous Q12H   sertraline   100 mg Oral Daily   sodium chloride  flush  3 mL Intravenous Q12H   sodium chloride  flush  3 mL Intravenous Q12H    Assessment: 73 YOM w/ PMH significant for AF on Xarelto  PTA [last known dose 11/27 1659] who needs heparin  therapy 2/2 need for EGD w/ esophageal dilation. No history noted for stroke, intracranial bleed, or GIB from review of chart. He had an ICD placed 03/01/2024  aPTT 44 is subtherapeutic. Heparin  level 0.30 is at the lower threshold of therapeutic with heparin  running at 1550 units/hr. Hgb (7.9) is slightly downtrending and PLTs (328) are stable. Patient renal function is stable. Per RN, no report of pauses, issues with the line, or signs of bleeding. Of note, no baseline aPTT and heparin  levels were collected. Xarelto  use can impact heparin  level. Will continue to collect both aPTT and heparin  levels, and will dose based on aPTT until levels correlate.   Goal of Therapy:  Heparin  level 0.3-0.7 units/ml aPTT 66-102 seconds Monitor platelets by anticoagulation protocol: Yes   Plan:  Increase heparin  to 1750 units/hr Recheck aPTT after 8 hours to confirm therapeutic Monitor daily aPTT, heparin  levels, and CBC Monitor for any signs/symptoms of bleeding Continue to use  aPTT for monitoring until aPTT and heparin  level correlate  Thank you for allowing pharmacy to be involved with this patient's care.  Mendel Barter, PharmD PGY1 Clinical Pharmacist [facility]  05/14/2024 12:39 PM

## 2024-05-14 NOTE — Progress Notes (Signed)
 PHARMACY - ANTICOAGULATION CONSULT NOTE  Pharmacy Consult for heparin  (transition from Xarelto ) Indication: atrial fibrillation  Allergies  Allergen Reactions   Buprenorphine Hcl Nausea And Vomiting   Hydromorphone  Other (See Comments)    Mental Status Changes/HALLUCINATIONS   Mirtazapine Other (See Comments)    Nightmares & altered mental status    Codeine Nausea And Vomiting   Morphine  And Codeine Nausea And Vomiting    Patient Measurements: Height: 5' 10 (177.8 cm) Weight: 125.9 kg (277 lb 9 oz) IBW/kg (Calculated) : 73 HEPARIN  DW (KG): 101.6  Vital Signs: Temp: 97.6 F (36.4 C) (11/29 0035) Temp Source: Tympanic (11/29 0035) BP: 121/73 (11/29 0035) Pulse Rate: 107 (11/29 0035)  Labs: Recent Labs    05/11/24 1037 05/11/24 1322 05/12/24 0230 05/12/24 0230 05/13/24 1015 05/14/24 0221  HGB  --   --  8.7*   < > 8.9* 7.9*  HCT  --   --  29.7*  --  31.1* 27.2*  PLT  --   --  275  --  344 328  APTT  --   --   --   --   --  62*  HEPARINUNFRC  --   --   --   --   --  0.64  CREATININE  --   --  1.32*  --  <0.30* 1.25*  TROPONINIHS 10 9  --   --   --   --    < > = values in this interval not displayed.    Estimated Creatinine Clearance: 68 mL/min (A) (by C-G formula based on SCr of 1.25 mg/dL (H)).   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   05/08/2024   albuterol  (PROVENTIL  HFA;VENTOLIN  HFA) 108 (90 BASE) MCG/ACT inhaler Inhale 2 puffs into the lungs every 4 (four) hours as needed for wheezing or shortness of breath.   05/09/2024 Morning   amiodarone  (PACERONE ) 200 MG tablet Take 1 tablet (200 mg total) by mouth 2 (two) times daily. 60 tablet 3 05/09/2024 Morning   atorvastatin  (LIPITOR ) 80 MG tablet Take 80 mg by mouth at bedtime.   05/08/2024   azithromycin  (ZITHROMAX ) 250 MG tablet Take 250 mg by mouth every Monday, Wednesday, and Friday.   05/09/2024 Morning   budesonide  (PULMICORT ) 0.5 MG/2ML nebulizer solution Take 2 mLs by nebulization 2 (two) times daily.   05/09/2024 Morning   clopidogrel  (PLAVIX ) 75 MG tablet Take 75 mg by mouth daily.   05/09/2024 at  9:00 AM   dapagliflozin propanediol (FARXIGA) 10 MG TABS tablet Take 10 mg by mouth daily.   05/09/2024 Morning   donepezil  (ARICEPT ) 10 MG tablet Take 10 mg by mouth at bedtime.   05/08/2024   doxepin  (SINEQUAN ) 10 MG  capsule Take 10-20 mg by mouth at bedtime.   05/08/2024   insulin  aspart (NOVOLOG  FLEXPEN) 100 UNIT/ML injection Inject 0-13 Units into the skin See admin instructions. Inject 0-13 units into the skin three times a day with meals, PER SLIDING SCALE   05/09/2024 Morning   iron  polysaccharides (NIFEREX) 150 MG capsule Take 150 mg by mouth 2 (two) times daily.   05/09/2024 Morning   isosorbide  mononitrate (IMDUR ) 30 MG 24 hr tablet Take 30 mg by mouth daily.   05/09/2024 Morning   latanoprost  (XALATAN ) 0.005 % ophthalmic solution Place 1 drop into both eyes at bedtime.     05/08/2024   lidocaine  4 % Place 1 patch onto the skin daily. Apply to lower back.   Taking   losartan  (COZAAR ) 25 MG tablet Take 1 tablet (25 mg total) by mouth daily. 90 tablet 2 05/09/2024 Morning   memantine  (NAMENDA ) 10 MG tablet Take 10 mg by mouth 2 (two) times daily.   05/09/2024 Morning   metoprolol  succinate (TOPROL -XL) 50 MG 24 hr tablet Take 1 tablet (50 mg  total) by mouth in the morning and at bedtime. Take with or immediately following a meal. 180 tablet 3 05/09/2024 at  9:00 AM   mirabegron  ER (MYRBETRIQ ) 50 MG TB24 tablet Take 50 mg by mouth daily.   05/09/2024 Morning   montelukast  (SINGULAIR ) 10 MG tablet Take 10 mg by mouth at bedtime.   05/08/2024   Multiple Vitamin (MULTIVITAMIN WITH MINERALS) TABS tablet Take 1 tablet by mouth daily with breakfast.   05/09/2024 Morning   Multiple Vitamins-Minerals (PRESERVISION AREDS PO) Take 2 capsules by mouth in the morning.   05/09/2024 Morning   omeprazole (PRILOSEC) 40 MG capsule Take 1 capsule by mouth daily after supper.   05/08/2024   oxybutynin  (DITROPAN -XL) 10 MG 24 hr tablet Take 10 mg by mouth daily.   05/09/2024 Morning   OXYGEN Inhale 3 L into the lungs daily as needed (Shortness of breath, wheezing).   05/09/2024   sertraline  (ZOLOFT ) 100 MG tablet Take 100 mg by mouth daily.   05/09/2024 Morning   spironolactone  (ALDACTONE ) 25 MG tablet Take 25 mg by mouth daily.   05/09/2024 Morning   Teriparatide  560 MCG/2.24ML SOPN Inject 20 mcg into the skin at bedtime.   05/08/2024   torsemide  (DEMADEX ) 20 MG tablet Take 1 tablet (20 mg total) by mouth daily. (Patient taking differently: Take 20 mg by mouth 2 (two) times daily at 10 AM and 5 PM.)   05/09/2024 Morning   TRESIBA  FLEXTOUCH 100 UNIT/ML FlexTouch Pen Inject 40-46 Units into the skin See admin instructions. Inject 40 units into the skin in the morning and 46 units at bedtime   05/09/2024 Morning   WIXELA INHUB 250-50 MCG/ACT AEPB Inhale 2 puffs into the lungs in the morning.   05/09/2024 Morning   XARELTO  15 MG TABS tablet Take 15 mg by mouth daily with supper.   Taking   benzonatate  (TESSALON ) 200 MG capsule Take 1 capsule (200 mg total) by mouth 3 (three) times daily as needed for cough. (Patient not taking: Reported on 05/10/2024) 20 capsule 0 Not Taking   Scheduled:   atorvastatin   80 mg Oral QHS   budesonide   2 mL Nebulization BID    budesonide -formoterol   2 puff Inhalation BID   clopidogrel   75 mg Oral Daily   digoxin   0.125 mg Oral Daily   docusate sodium   100 mg Oral BID   donepezil   10 mg Oral QHS  guaiFENesin   600 mg Oral BID   insulin  aspart  0-5 Units Subcutaneous QHS   insulin  aspart  0-6 Units Subcutaneous TID WC   insulin  glargine-yfgn  40 Units Subcutaneous q morning   iron  polysaccharides  150 mg Oral BID   isosorbide  mononitrate  30 mg Oral Daily   memantine   10 mg Oral BID   metoprolol  succinate  100 mg Oral BID   mirabegron  ER  50 mg Oral Daily   montelukast   10 mg Oral QHS   oxybutynin   10 mg Oral QHS   pantoprazole  (PROTONIX ) IV  40 mg Intravenous Q12H   sertraline   100 mg Oral Daily   sodium chloride  flush  3 mL Intravenous Q12H   sodium chloride  flush  3 mL Intravenous Q12H    Assessment: 73 YOM w/ PMH significant for AF on Xarelto  PTA [last known dose 11/27 1659] who needs heparin  therapy 2/2 need for EGD w/ esophageal dilation. No history noted for stroke, intracranial bleed, or GIB from review of chart. He had an ICD placed 03/01/2024  11/29 AM update:  aPTT sub-therapeutic   Goal of Therapy:  Heparin  level 0.3-0.7 units/ml aPTT 66-102 seconds Monitor platelets by anticoagulation protocol: Yes   Plan:  Inc heparin  to 1550 units/hr Heparin  level and aPTT in 8 hours DC aPTT once correlates with heparin  level Continue to monitor H&H and platelets  Lynwood Mckusick, PharmD, BCPS Clinical Pharmacist Phone: [phone]

## 2024-05-14 NOTE — Progress Notes (Signed)
  Progress Note  Patient Name: Mike Hunter Date of Encounter: 05/14/2024 [facility] Cardiologist: Jerel Balding, MD   Interval Summary   Reports feeling worse today. More aspiration. Some palpitations. Generally fatigued. Did have small BM which he was grateful for.  Vital Signs Vitals:   05/14/24 0529 05/14/24 0835 05/14/24 0847 05/14/24 1100  BP: 122/74  105/73 106/71  Pulse: 93  86 76  Resp: 20  20 (!) 25  Temp: 97.6 F (36.4 C)  97.9 F (36.6 C) 98.6 F (37 C)  TempSrc: Axillary  Oral Oral  SpO2: 98% 99% 100% 90%  Weight:      Height:        Intake/Output Summary (Last 24 hours) at 05/14/2024 1207 Last data filed at 05/14/2024 9394 Gross per 24 hour  Intake 1400.43 ml  Output 1500 ml  Net -99.57 ml      05/13/2024    3:00 PM 05/09/2024   11:00 PM 04/07/2024    5:31 AM  Last 3 Weights  Weight (lbs) 277 lb 9 oz 277 lb 9 oz 277 lb 9.6 oz  Weight (kg) 125.9 kg 125.9 kg 125.919 kg      Telemetry/ECG  AF with rates predominantly <100bpm since yesterday evening - Personally Reviewed  Physical Exam  General: Chronically ill appearing, in no acute distress.  Neck: No JVD.  Cardiac: Tachycardic, irregular rhythm.  Resp: Normal work of breathing.  Ext: No edema.  Neuro: No gross focal deficits.  Psych: Normal affect.   Assessment & Plan  Mike Hunter is a 73 y.o. 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    Unfortunately, found to have likely aspiration pneumonia with fever and leukocytosis. Ongoing aspiration events.    Problem List:  AF/RVR AFL Sustained VT s/p LEFT BSCI DC ICD (DOI 03/01/24)  HFrEF CAD s/p LAD PCI  Aspiration pneumonia   Plan: -Continue amiodarone  for now. Continue metoprolol  XL 100mg  BID.  Continue digoxin  0.125mg   daily which appears to have helped with rate control; hopefully, will improve further after receiving more doses and becoming therapeutic.   -Transitioned to heparin  in lieu of Xarelto  for possible upcoming procedures.  -Will need antibiotic course for any infectious issues to be complete prior to any device upgrade.  -Ultimately, likely to have AVN ablation and upgrade to CRT, hopefully prior to discharge, given his poor functional status and co-morbidities. Will need to be able to lay flat for procedure without significant risk of aspiration. This may require GI intervention beforehand.  Signed, Fonda Kitty, MD

## 2024-05-14 NOTE — Progress Notes (Addendum)
 PROGRESS NOTE    Mike Hunter  FMW:[id number] DOB: [birth year] DOA: 05/09/2024 PCP: Nikki Hansel Atlas, MD   Brief Narrative: 73 year old with past medical history significant for COPD, paroxysmal A-fib status post pacemaker on Xarelto , heart failure reduced ejection fraction 35 to 40%, CKD 3B, C5-C7 fusion surgery with hardware placement, chronic anemia, hyperlipidemia, urinary incontinence, anemia of chronic disease, bipolar disorder, dementia, obstructive sleep apnea on CPAP, morbid obesity, insulin -dependent diabetes type 2 presented to the ED complaining of falling out of his bed and ICD firing.  Chest x-ray showed bibasilar atelectasis.  Cardiomegaly without overt heart failure.  CT chest abdomen and pelvis no acute intrathoracic and intra-abdominal pelvic pathology.  Patchy dependent bilateral lower lobe consolidation, favor atelectasis over airspace disease.  CT cervical spine no acute abnormality.  Stable appearing anterior fusion hardware from C5-C7.  CT head no acute intracranial abnormality. Seen by cardiology no stress test needed.  He is a scheduled for cardiac ablation 12/1 as an outpatient.  During hospitalization patient develops A-fib with RVR, he was started on IV amiodarone .He develops  fever, cough, Started on IV Unasyn . Concern for aspiration, concern for esophageal dysphagia. Plan to consult GI>    Assessment & Plan:   Principal Problem:   Acute kidney injury superimposed on chronic kidney disease Active Problems:   Acute kidney injury superimposed on stage 3b chronic kidney disease (HCC)   Fall at home, initial encounter   HFrEF (heart failure with reduced ejection fraction) (HCC)   Insulin  dependent type 2 diabetes mellitus (HCC)   Hyperlipidemia   History of COPD   History of dementia   History of fusion of cervical spine   Chronic anemia   History of bipolar disorder   Obstructive sleep apnea   Atrial fibrillation with RVR (HCC)   Aspiration pneumonia (HCC)    Dysphagia   Globus sensation   Anticoagulated   Antiplatelet or antithrombotic long-term use  1-AKI on superimposed CKD stage IIIb: - CT abdomen and pelvis no acute intrathoracic abdominal or pelvic finding. - Holding torsemide , Jardiance and losartan . - Received IV fluids. -Cr base line 1.4--1.6 Renal function back to baseline.   Aspiration PNA.  Had  fever, cough, leukocytosis . CT showed atelectasis vs infiltrates bases.  Report dysphagia.  Speech consulted. Concern for esophageal dysphagia.  Continue with  IV Protonix .  Continue with IV Unasyn , vancomycin .  Blood culture: no growth to date.  . Lactic acid normalized.  Respiratory panel negative. Covid negative 11/28: report aspiration event in the morning after drinking water.  -11/18 chest x ray. Shows New mild right upper lobe airspace opacity, suspicious for pneumonia. -Continue with antibiotics.  Clear liquid diet.   Paroxysmal A-fib status post pacemaker placement On amiodarone  and Toprol , Xarelto  Initially scheduled for cardiac ablation 12/1 Developed A-fib with RVR and was started on IV amiodarone  Cardiology consulted.  Continue with amiodarone  gtt.  Started on  digoxin .   Dysphagia;  Evaluated by speech, think more esophageal dysphagia.  Esophagogram ordered.  IV protonix .  Recurrent aspiration event.  GI consulted.   Mechanical fall at home/physical deconditioning -PT OT consulted  Chest pain;  Sharp in quality EKG ordered.  Troponin negative.  Cardiology following. Associated with a ib, and pr PNA On IV amiodarone  for A fib  Heart failure reduced ejection fraction, POA, chronic NYHA III, ejection fraction 35 to 40% - Continue Toprol  and Imdur  Holding torsemide  Jardiance and losartan  and spironolactone  due to AKI -Received IV lasix  11/27 11/28 -negative 7 L  Insulin -dependent  diabetes type 2: - On Tresiba   History of dementia: - Continue memantine  and donezepil  Obstructive sleep apnea on  CPAP - Continue CPAP at bedtime  Hyperlipidemia: - Continue atorvastatin   History of bipolar disorder/depression - Continue Zoloft   History of chronic anemia - Monitor hemoglobin  Hypokalemia; replete IV   Estimated body mass index is 39.83 kg/m as calculated from the following:   Height as of this encounter: 5' 10 (1.778 m).   Weight as of this encounter: 125.9 kg.   DVT prophylaxis: Xarelto  Code Status: Full code Family Communication: Care discussed with patient, wife over phone 11/29.  Disposition Plan:  Status is: Inpatient Remains inpatient appropriate because: Management of A-fib RVR, fever leukocytosis    Consultants:  Urology  Procedures:    Antimicrobials:    Subjective: Complaints of neck , back pain chronic pain.  Also still has chest pain.  He would like to try clear liquid.  Continue to cough.  He doesn't feel well.   Objective: Vitals:   05/13/24 2043 05/13/24 2048 05/14/24 0035 05/14/24 0529  BP:  108/69 121/73 122/74  Pulse:  (!) 117 (!) 107 93  Resp:  (!) 23 18 20   Temp:  98.1 F (36.7 C) 97.6 F (36.4 C) 97.6 F (36.4 C)  TempSrc:  Oral Tympanic Axillary  SpO2: 94% 100% 97% 98%  Weight:      Height:        Intake/Output Summary (Last 24 hours) at 05/14/2024 0805 Last data filed at 05/14/2024 9394 Gross per 24 hour  Intake 1400.43 ml  Output 1900 ml  Net -499.57 ml   Filed Weights   05/09/24 2300 05/13/24 1500  Weight: 125.9 kg 125.9 kg    Examination:  General exam: NAD Respiratory system: Normal Respiratory effort. Sporadic wheezing.  Cardiovascular system: SS 1, S IRR Gastrointestinal system: BS present, soft nt Central nervous system: Alert.  Extremities: Symmetric 5 x 5 power.   Data Reviewed: I have personally reviewed following labs and imaging studies  CBC: Recent Labs  Lab 05/10/24 0240 05/10/24 2324 05/12/24 0230 05/13/24 1015 05/14/24 0221  WBC 10.3 16.2* 14.5* 16.0* 12.4*  NEUTROABS  --  14.0*   --   --   --   HGB 9.2* 9.9* 8.7* 8.9* 7.9*  HCT 32.7* 34.3* 29.7* 31.1* 27.2*  MCV 77.1* 74.7* 75.0* 74.9* 74.5*  PLT 288 358 275 344 328   Basic Metabolic Panel: Recent Labs  Lab 05/10/24 0240 05/10/24 2324 05/12/24 0230 05/13/24 1015 05/14/24 0221  NA 143 140 136 138 138  K 3.3* 3.9 3.8 3.9 3.2*  CL 106 105 105 106 107  CO2 22 25 23  21* 21*  GLUCOSE 249* 208* 171* 271* 167*  BUN 29* 19 16 18 16   CREATININE 1.76* 1.57* 1.32* <0.30* 1.25*  CALCIUM  8.1* 8.6* 8.3* 8.3* 8.0*  MG  --  2.5*  --  2.1  --    GFR: Estimated Creatinine Clearance: 68 mL/min (A) (by C-G formula based on SCr of 1.25 mg/dL (H)). Liver Function Tests: Recent Labs  Lab 05/09/24 1832 05/10/24 0240  AST 25 19  ALT 31 28  ALKPHOS 85 81  BILITOT 0.8 0.7  PROT 6.4* 6.0*  ALBUMIN  3.0* 2.8*   No results for input(s): LIPASE, AMYLASE in the last 168 hours. No results for input(s): AMMONIA in the last 168 hours. Coagulation Profile: No results for input(s): INR, PROTIME in the last 168 hours. Cardiac Enzymes: No results for input(s): CKTOTAL, CKMB, CKMBINDEX, TROPONINI in  the last 168 hours. BNP (last 3 results) Recent Labs    02/25/24 1520 04/05/24 1343 04/06/24 0554  PROBNP 2,137.0* 2,191.0* 2,599.0*   HbA1C: No results for input(s): HGBA1C in the last 72 hours. CBG: Recent Labs  Lab 05/13/24 0618 05/13/24 1136 05/13/24 1624 05/13/24 2044 05/14/24 0600  GLUCAP 228* 291* 205* 194* 146*   Lipid Profile: No results for input(s): CHOL, HDL, LDLCALC, TRIG, CHOLHDL, LDLDIRECT in the last 72 hours. Thyroid Function Tests: No results for input(s): TSH, T4TOTAL, FREET4, T3FREE, THYROIDAB in the last 72 hours.  Anemia Panel: No results for input(s): VITAMINB12, FOLATE, FERRITIN, TIBC, IRON , RETICCTPCT in the last 72 hours. Sepsis Labs: Recent Labs  Lab 05/11/24 0955 05/11/24 1322 05/11/24 1923 05/11/24 2140  LATICACIDVEN 1.6 2.5*  1.1 0.8    Recent Results (from the past 240 hours)  Urine Culture (for pregnant, neutropenic or urologic patients or patients with an indwelling urinary catheter)     Status: None   Collection Time: 05/11/24 10:34 AM   Specimen: Urine, Clean Catch  Result Value Ref Range Status   Specimen Description URINE, CLEAN CATCH  Final   Special Requests NONE  Final   Culture   Final    NO GROWTH Performed at [facility], [address], [place], [place] [address]    Report Status 05/13/2024 FINAL  Final  Culture, blood (Routine X 2) w Reflex to ID Panel     Status: None (Preliminary result)   Collection Time: 05/11/24 10:37 AM   Specimen: BLOOD  Result Value Ref Range Status   Specimen Description BLOOD RIGHT ANTECUBITAL  Final   Special Requests   Final    BOTTLES DRAWN AEROBIC ONLY Blood Culture results may not be optimal due to an inadequate volume of blood received in culture bottles   Culture   Final    NO GROWTH 2 DAYS Performed at [facility], [address] [place], [place] [address]    Report Status PENDING  Incomplete  Culture, blood (Routine X 2) w Reflex to ID Panel     Status: None (Preliminary result)   Collection Time: 05/11/24 10:43 AM   Specimen: BLOOD  Result Value Ref Range Status   Specimen Description BLOOD RIGHT ANTECUBITAL  Final   Special Requests   Final    BOTTLES DRAWN AEROBIC AND ANAEROBIC Blood Culture adequate volume   Culture   Final    NO GROWTH 2 DAYS Performed at [facility], [address] [place], [place] [address]    Report Status PENDING  Incomplete  MRSA Next Gen by PCR, Nasal     Status: None   Collection Time: 05/11/24  6:47 PM   Specimen: Nasal Mucosa; Nasal Swab  Result Value Ref Range Status   MRSA by PCR Next Gen NOT DETECTED NOT DETECTED Final    Comment: (NOTE) The GeneXpert MRSA Assay (FDA approved for NASAL specimens only), is one component of a comprehensive MRSA colonization surveillance program. It is  not intended to diagnose MRSA infection nor to guide or monitor treatment for MRSA infections. Test performance is not FDA approved in patients less than 45 years old. Performed at [facility], [address] [place], [place] [address]   Respiratory (~20 pathogens) panel by PCR     Status: None   Collection Time: 05/12/24  7:26 AM   Specimen: Nasopharyngeal Swab; Respiratory  Result Value Ref Range Status   Adenovirus NOT DETECTED NOT DETECTED Final  Coronavirus 229E NOT DETECTED NOT DETECTED Final    Comment: (NOTE) The Coronavirus on the Respiratory Panel, DOES NOT test for the novel  Coronavirus (2019 nCoV)    Coronavirus HKU1 NOT DETECTED NOT DETECTED Final   Coronavirus NL63 NOT DETECTED NOT DETECTED Final   Coronavirus OC43 NOT DETECTED NOT DETECTED Final   Metapneumovirus NOT DETECTED NOT DETECTED Final   Rhinovirus / Enterovirus NOT DETECTED NOT DETECTED Final   Influenza A NOT DETECTED NOT DETECTED Final   Influenza B NOT DETECTED NOT DETECTED Final   Parainfluenza Virus 1 NOT DETECTED NOT DETECTED Final   Parainfluenza Virus 2 NOT DETECTED NOT DETECTED Final   Parainfluenza Virus 3 NOT DETECTED NOT DETECTED Final   Parainfluenza Virus 4 NOT DETECTED NOT DETECTED Final   Respiratory Syncytial Virus NOT DETECTED NOT DETECTED Final   Bordetella pertussis NOT DETECTED NOT DETECTED Final   Bordetella Parapertussis NOT DETECTED NOT DETECTED Final   Chlamydophila pneumoniae NOT DETECTED NOT DETECTED Final   Mycoplasma pneumoniae NOT DETECTED NOT DETECTED Final    Comment: Performed at [facility], [address], [place], [place] [address]  SARS Coronavirus 2 by RT PCR (hospital order, performed in [facility] hospital lab) *cepheid single result test* Anterior Nasal Swab     Status: None   Collection Time: 05/13/24  3:11 PM   Specimen: Anterior Nasal Swab  Result Value Ref Range Status   SARS Coronavirus 2 by RT PCR NEGATIVE NEGATIVE Final    Comment:  Performed at [facility], [address] [place], [place] [address]         Radiology Studies: DG Swallowing Func-Speech Pathology Result Date: 05/13/2024 Table formatting from the original result was not included. Modified Barium Swallow Study Patient Details Name: Mike Hunter MRN: [id number] Date of Birth: [birth year] Today's Date: 05/13/2024 HPI/PMH: HPI: 73 yo male presenting to ED after falling out of bed and ICD firing. Admitted with AKI superimposed on CKD and A-fib with RVR, initially scheduled for an OP ablation 12/1 (cardiology following). CT Chest shows patchy dependent bilateral lower lobe consolidation, favor atelectasis over airspace disease. CXR 11/28 shows new mild RUL opacity, suspicious for pneumonia. Most recent SLP visit July 2025 with functional-appearing oropharyngeal swallow clinically and recommendations to f/u for an esophagram. FEES 06/04/23 also WFL. PMH: COPD, paroxysmal A-fib s/p pacemaker on Xarelto , HF reduced EF 35-40%, CKD 3B, C5-C7 fusion surgery with hardware placement, HLD, dementia, T2DM Clinical Impression: Pt exhibits functional oropharyngeal swallowing. Trace, transient penetration was observed with thin liquids (PAS 2) but no further penetration/aspiration occurred even when challenged with large volumes. Residue along the BOT, valleculae, and pyriform sinues is overall trace. Coughing was observed on multiple occasions without airway invasion or a significant collection of pharyngeal residue. Retention was not noted during the esophageal sweep when given the 13 mm barium tablet (difficult to fully visualize due to body habitus) but pt immediately endorsed globus sensation and heartburn. Could consider more dedicated esophageal assessment (esophagram ordered per attending MD and GI). Will f/u at least briefly for education but do not anticipate post-acute SLP needs. Factors that may increase risk of adverse event in presence of aspiration [name] & [name]  2021): Factors that may increase risk of adverse event in presence of aspiration [name] & [name] 2021): Poor general health and/or compromised immunity; Limited mobility Recommendations/Plan: Swallowing Evaluation Recommendations Swallowing Evaluation Recommendations Recommendations: PO diet PO Diet Recommendation: Regular; Thin liquids (Level 0) Liquid Administration via: Cup; Straw Medication Administration:  Whole meds with liquid Supervision: Patient able to self-feed; Intermittent supervision/cueing for swallowing strategies Swallowing strategies  : Minimize environmental distractions; Slow rate; Small bites/sips Postural changes: Position pt fully upright for meals; Stay upright 30-60 min after meals Oral care recommendations: Oral care BID (2x/day) Recommended consults: Consider esophageal assessment Treatment Plan Treatment Plan Treatment recommendations: Therapy as outlined in treatment plan below Follow-up recommendations: No SLP follow up Functional status assessment: Patient has had a recent decline in their functional status and demonstrates the ability to make significant improvements in function in a reasonable and predictable amount of time. Treatment frequency: Min 2x/week Treatment duration: 1 week Interventions: Aspiration precaution training; Patient/family education; Trials of upgraded texture/liquids; Diet toleration management by SLP Recommendations Recommendations for follow up therapy are one component of a multi-disciplinary discharge planning process, led by the attending physician.  Recommendations may be updated based on patient status, additional functional criteria and insurance authorization. Assessment: Orofacial Exam: Orofacial Exam Oral Cavity: Oral Hygiene: WFL Oral Cavity - Dentition: Missing dentition Orofacial Anatomy: WFL Oral Motor/Sensory Function: WFL Anatomy: Anatomy: Suspected cervical osteophytes Boluses Administered: Boluses Administered Boluses Administered: Thin  liquids (Level 0); Mildly thick liquids (Level 2, nectar thick); Moderately thick liquids (Level 3, honey thick); Puree; Solid  Oral Impairment Domain: Oral Impairment Domain Lip Closure: No labial escape Tongue control during bolus hold: Cohesive bolus between tongue to palatal seal Bolus preparation/mastication: Timely and efficient chewing and mashing Bolus transport/lingual motion: Brisk tongue motion Oral residue: Complete oral clearance Location of oral residue : N/A Initiation of pharyngeal swallow : Valleculae  Pharyngeal Impairment Domain: Pharyngeal Impairment Domain Soft palate elevation: No bolus between soft palate (SP)/pharyngeal wall (PW) Laryngeal elevation: Complete superior movement of thyroid cartilage with complete approximation of arytenoids to epiglottic petiole Anterior hyoid excursion: Complete anterior movement Epiglottic movement: Complete inversion Laryngeal vestibule closure: Complete, no air/contrast in laryngeal vestibule Pharyngeal stripping wave : Present - complete Pharyngeal contraction (A/P view only): N/A Pharyngoesophageal segment opening: Complete distension and complete duration, no obstruction of flow Tongue base retraction: Trace column of contrast or air between tongue base and PPW Pharyngeal residue: Trace residue within or on pharyngeal structures Location of pharyngeal residue: Tongue base; Valleculae; Pyriform sinuses  Esophageal Impairment Domain: Esophageal Impairment Domain Esophageal clearance upright position: Complete clearance, esophageal coating Pill: Pill Consistency administered: Thin liquids (Level 0) Thin liquids (Level 0): [facility] Penetration/Aspiration Scale Score: Penetration/Aspiration Scale Score 1.  Material does not enter airway: Mildly thick liquids (Level 2, nectar thick); Moderately thick liquids (Level 3, honey thick); Puree; Solid; Pill 2.  Material enters airway, remains ABOVE vocal cords then ejected out: Thin liquids (Level 0) Compensatory  Strategies: Compensatory Strategies Compensatory strategies: No   General Information: Caregiver present: Yes (RN)  Diet Prior to this Study: Regular; Thin liquids (Level 0)   Temperature : Normal   Respiratory Status: Increased WOB   Supplemental O2: Nasal cannula   History of Recent Intubation: No  Behavior/Cognition: Alert; Cooperative Self-Feeding Abilities: Able to self-feed Baseline vocal quality/speech: Normal Volitional Cough: Able to elicit Volitional Swallow: Able to elicit Exam Limitations: No limitations Goal Planning: Prognosis for improved oropharyngeal function: Good Barriers to Reach Goals: Time post onset No data recorded Patient/Family Stated Goal: none stated Consulted and agree with results and recommendations: Patient; Nurse; Physician Pain: Pain Assessment Pain Assessment: No/denies pain End of Session: Start Time:SLP Start Time (ACUTE ONLY): 1309 Stop Time: SLP Stop Time (ACUTE ONLY): 1329 Time Calculation:SLP Time Calculation (min) (ACUTE ONLY): 20 min Charges: SLP  Evaluations $ SLP Speech Visit: 1 Visit SLP Evaluations $MBS Swallow: 1 Procedure SLP visit diagnosis: SLP Visit Diagnosis: Dysphagia, unspecified (R13.10) Past Medical History: Past Medical History: Diagnosis Date  Asthma   Atrial fibrillation (HCC)   Bipolar affective (HCC)   Cataract   CHF (congestive heart failure) (HCC)   Conversion disorder   COPD (chronic obstructive pulmonary disease) (HCC)   Cornea disorder   Diabetes mellitus   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  High cholesterol   History of methicillin resistant staphylococcus aureus (MRSA)   Hypertension   Reflux   Renal disorder   Sleep apnea  Past Surgical History: Past Surgical History: Procedure Laterality Date  CARDIOVERSION N/A 08/26/2023  Procedure: CARDIOVERSION;  Surgeon: Barbaraann Darryle Ned, MD;  Location: [facility] INVASIVE CV LAB;  Service: Cardiovascular;  Laterality: N/A;  CARDIOVERSION N/A 02/26/2024  Procedure: CARDIOVERSION;  Surgeon: Lonni Slain, MD;  Location: [facility] INVASIVE CV LAB;  Service: Cardiovascular;  Laterality: N/A;  CARDIOVERSION N/A 03/04/2024  Procedure: CARDIOVERSION;  Surgeon: Francyne Headland, MD;  Location: [facility] INVASIVE CV LAB;  Service: Cardiovascular;  Laterality: N/A;  CATARACT EXTRACTION    CERVICAL FUSION    CHOLECYSTECTOMY    ICD IMPLANT N/A 03/01/2024  Procedure: ICD IMPLANT;  Surgeon: Kennyth Chew, MD;  Location: [facility] INVASIVE CV LAB;  Service: Cardiovascular;  Laterality: N/A;  KIDNEY STONE SURGERY    KNEE ARTHROSCOPY    TRANSESOPHAGEAL ECHOCARDIOGRAM (CATH LAB) N/A 03/04/2024  Procedure: TRANSESOPHAGEAL ECHOCARDIOGRAM;  Surgeon: Francyne Headland, MD;  Location: [facility] INVASIVE CV LAB;  Service: Cardiovascular;  Laterality: N/A; Damien Blumenthal, M.A., CCC-SLP Speech Language Pathology, Acute Rehabilitation Services Secure Chat preferred [phone] 05/13/2024, 2:38 PM  DG CHEST PORT 1 VIEW Result Date: 05/13/2024 CLINICAL DATA:  Dyspnea. EXAM: PORTABLE CHEST 1 VIEW COMPARISON:  05/10/2024 FINDINGS: Stable cardiomegaly. AICD again seen. Low lung volumes again noted. Stable pulmonary vascular congestion. New mild airspace opacity is seen in the right upper lobe, suspicious for pneumonia. IMPRESSION: New mild right upper lobe airspace opacity, suspicious for pneumonia. Electronically Signed   By: Norleen DELENA Kil M.D.   On: 05/13/2024 10:05         Scheduled Meds:  atorvastatin   80 mg Oral QHS   budesonide   2 mL Nebulization BID   budesonide -formoterol   2 puff Inhalation BID   clopidogrel   75 mg Oral Daily   digoxin   0.125 mg Oral Daily   docusate sodium   100 mg Oral BID   donepezil   10 mg Oral QHS   guaiFENesin   600 mg Oral BID   insulin  aspart  0-5 Units Subcutaneous QHS   insulin  aspart  0-6 Units Subcutaneous TID WC   insulin  glargine-yfgn  40 Units Subcutaneous q morning   iron  polysaccharides  150 mg Oral BID   isosorbide  mononitrate  30 mg Oral Daily   memantine   10 mg Oral BID   metoprolol  succinate  100 mg  Oral BID   mirabegron  ER  50 mg Oral Daily   montelukast   10 mg Oral QHS   oxybutynin   10 mg Oral QHS   pantoprazole  (PROTONIX ) IV  40 mg Intravenous Q12H   sertraline   100 mg Oral Daily   sodium chloride  flush  3 mL Intravenous Q12H   sodium chloride  flush  3 mL Intravenous Q12H   Continuous Infusions:  amiodarone  30 mg/hr (05/14/24 0719)   ampicillin -sulbactam (UNASYN ) IV 200 mL/hr at 05/14/24 0605   heparin  1,550 Units/hr (05/14/24 0605)   potassium chloride      vancomycin   Stopped (05/13/24 2303)     LOS: 5 days    Time spent: 35 minutes    Sybel Standish A Constanza Mincy, MD [facility]   If 7PM-7AM, please contact night-coverage [email or web address]  05/14/2024, 8:05 AM

## 2024-05-14 NOTE — Progress Notes (Signed)
 PHARMACY - ANTICOAGULATION CONSULT NOTE  Pharmacy Consult for heparin  (transition from Xarelto ) Indication: atrial fibrillation  Allergies  Allergen Reactions   Buprenorphine Hcl Nausea And Vomiting   Hydromorphone  Other (See Comments)    Mental Status Changes/HALLUCINATIONS   Mirtazapine Other (See Comments)    Nightmares & altered mental status    Codeine Nausea And Vomiting   Morphine  And Codeine Nausea And Vomiting    Patient Measurements: Height: 5' 10 (177.8 cm) Weight: 125.9 kg (277 lb 9 oz) IBW/kg (Calculated) : 73 HEPARIN  DW (KG): 101.6  Vital Signs: Temp: 98 F (36.7 C) (11/29 1500) Temp Source: Oral (11/29 1500) BP: 135/78 (11/29 2006) Pulse Rate: 102 (11/29 2006)  Labs: Recent Labs    05/12/24 0230 05/13/24 1015 05/14/24 0221 05/14/24 1120 05/14/24 2058  HGB 8.7* 8.9* 7.9*  --   --   HCT 29.7* 31.1* 27.2*  --   --   PLT 275 344 328  --   --   APTT  --   --  62* 44* 68*  HEPARINUNFRC  --   --  0.64 0.30 0.32  CREATININE 1.32* <0.30* 1.25*  --   --     Estimated Creatinine Clearance: 68 mL/min (A) (by C-G formula based on SCr of 1.25 mg/dL (H)).   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   05/08/2024   albuterol  (PROVENTIL  HFA;VENTOLIN  HFA) 108 (90 BASE) MCG/ACT inhaler Inhale 2 puffs into the lungs  every 4 (four) hours as needed for wheezing or shortness of breath.   05/09/2024 Morning   amiodarone  (PACERONE ) 200 MG tablet Take 1 tablet (200 mg total) by mouth 2 (two) times daily. 60 tablet 3 05/09/2024 Morning   atorvastatin  (LIPITOR ) 80 MG tablet Take 80 mg by mouth at bedtime.   05/08/2024   azithromycin  (ZITHROMAX ) 250 MG tablet Take 250 mg by mouth every Monday, Wednesday, and Friday.   05/09/2024 Morning   budesonide  (PULMICORT ) 0.5 MG/2ML nebulizer solution Take 2 mLs by nebulization 2 (two) times daily.   05/09/2024 Morning   clopidogrel  (PLAVIX ) 75 MG tablet Take 75 mg by mouth daily.   05/09/2024 at  9:00 AM   dapagliflozin propanediol (FARXIGA) 10 MG TABS tablet Take 10 mg by mouth daily.   05/09/2024 Morning   donepezil  (ARICEPT ) 10 MG tablet Take 10 mg by mouth at bedtime.   05/08/2024   doxepin  (SINEQUAN ) 10 MG capsule Take 10-20 mg by mouth at bedtime.   05/08/2024   insulin  aspart (NOVOLOG  FLEXPEN) 100 UNIT/ML injection Inject 0-13 Units into the skin See admin instructions. Inject 0-13 units into the skin three times a day with meals, PER SLIDING SCALE   05/09/2024 Morning   iron  polysaccharides (NIFEREX) 150 MG capsule  Take 150 mg by mouth 2 (two) times daily.   05/09/2024 Morning   isosorbide  mononitrate (IMDUR ) 30 MG 24 hr tablet Take 30 mg by mouth daily.   05/09/2024 Morning   latanoprost  (XALATAN ) 0.005 % ophthalmic solution Place 1 drop into both eyes at bedtime.     05/08/2024   lidocaine  4 % Place 1 patch onto the skin daily. Apply to lower back.   Taking   losartan  (COZAAR ) 25 MG tablet Take 1 tablet (25 mg total) by mouth daily. 90 tablet 2 05/09/2024 Morning   memantine  (NAMENDA ) 10 MG tablet Take 10 mg by mouth 2 (two) times daily.   05/09/2024 Morning   metoprolol  succinate (TOPROL -XL) 50 MG 24 hr tablet Take 1 tablet (50 mg total) by mouth in the morning and at bedtime. Take with or immediately following a meal. 180 tablet 3 05/09/2024 at  9:00 AM   mirabegron   ER (MYRBETRIQ ) 50 MG TB24 tablet Take 50 mg by mouth daily.   05/09/2024 Morning   montelukast  (SINGULAIR ) 10 MG tablet Take 10 mg by mouth at bedtime.   05/08/2024   Multiple Vitamin (MULTIVITAMIN WITH MINERALS) TABS tablet Take 1 tablet by mouth daily with breakfast.   05/09/2024 Morning   Multiple Vitamins-Minerals (PRESERVISION AREDS PO) Take 2 capsules by mouth in the morning.   05/09/2024 Morning   omeprazole (PRILOSEC) 40 MG capsule Take 1 capsule by mouth daily after supper.   05/08/2024   oxybutynin  (DITROPAN -XL) 10 MG 24 hr tablet Take 10 mg by mouth daily.   05/09/2024 Morning   OXYGEN Inhale 3 L into the lungs daily as needed (Shortness of breath, wheezing).   05/09/2024   sertraline  (ZOLOFT ) 100 MG tablet Take 100 mg by mouth daily.   05/09/2024 Morning   spironolactone  (ALDACTONE ) 25 MG tablet Take 25 mg by mouth daily.   05/09/2024 Morning   Teriparatide  560 MCG/2.24ML SOPN Inject 20 mcg into the skin at bedtime.   05/08/2024   torsemide  (DEMADEX ) 20 MG tablet Take 1 tablet (20 mg total) by mouth daily. (Patient taking differently: Take 20 mg by mouth 2 (two) times daily at 10 AM and 5 PM.)   05/09/2024 Morning   TRESIBA  FLEXTOUCH 100 UNIT/ML FlexTouch Pen Inject 40-46 Units into the skin See admin instructions. Inject 40 units into the skin in the morning and 46 units at bedtime   05/09/2024 Morning   WIXELA INHUB 250-50 MCG/ACT AEPB Inhale 2 puffs into the lungs in the morning.   05/09/2024 Morning   XARELTO  15 MG TABS tablet Take 15 mg by mouth daily with supper.   Taking   benzonatate  (TESSALON ) 200 MG capsule Take 1 capsule (200 mg total) by mouth 3 (three) times daily as needed for cough. (Patient not taking: Reported on 05/10/2024) 20 capsule 0 Not Taking   Scheduled:   atorvastatin   80 mg Oral QHS   budesonide   2 mL Nebulization BID   budesonide -formoterol   2 puff Inhalation BID   clopidogrel   75 mg Oral Daily   digoxin   0.125 mg Oral Daily   docusate sodium   100 mg Oral  BID   donepezil   10 mg Oral QHS   feeding supplement  1 Container Oral BID BM   guaiFENesin   1,200 mg Oral BID   insulin  aspart  0-5 Units Subcutaneous QHS   insulin  aspart  0-6 Units Subcutaneous TID WC   insulin  glargine-yfgn  40 Units Subcutaneous q morning   iron  polysaccharides  150 mg Oral BID  isosorbide  mononitrate  30 mg Oral Daily   memantine   10 mg Oral BID   metoprolol  succinate  100 mg Oral BID   mirabegron  ER  50 mg Oral Daily   montelukast   10 mg Oral QHS   oxybutynin   10 mg Oral QHS   pantoprazole  (PROTONIX ) IV  40 mg Intravenous Q12H   sertraline   100 mg Oral Daily   sodium chloride  flush  3 mL Intravenous Q12H   sodium chloride  flush  3 mL Intravenous Q12H    Assessment: 73 YOM w/ PMH significant for AF on Xarelto  PTA [last known dose 11/27 1659] who needs heparin  therapy 2/2 need for EGD w/ esophageal dilation. No history noted for stroke, intracranial bleed, or GIB from review of chart. He had an ICD placed 03/01/2024  Repeat aPTT and heparin  level therapeutic and correlating. Will stop aPTT checks.  Goal of Therapy:  Heparin  level 0.3-0.7 units/ml aPTT 66-102 seconds Monitor platelets by anticoagulation protocol: Yes   Plan:  Heparin  1750 units/h Daily heparin  level and CBC  Ozell Jamaica, PharmD, BCPS, [facility] Clinical Pharmacist [phone] Please check AMION for all [facility] Pharmacy numbers 05/14/2024

## 2024-05-14 NOTE — Progress Notes (Signed)
 RN assessed patients Left hand PIV pink around the site with amiodarone  gtt infusing. RN removed PIV. Pharmacy notified. MD made aware.

## 2024-05-14 NOTE — Progress Notes (Signed)
 Pharmacy Antibiotic Note  Mike Hunter is a 73 y.o. male admitted on 05/09/2024 with sepsis.  Pharmacy has been consulted for vancomycin  dosing.  CXR Cardiomegaly w/ mild pulmonary vascular congestion, w/o edema or consolidation;  SCr has dropped again to < 0.3, will adjust vancomycin  dose for this change  Plan: Adjust Vancomycin  to 750 mg q12h (estAUC 494.8, used Scr 1.25, Vd 0.5) Goal AUC 400-550 -F/u renal function, LOT, and culture data -F/u vancomycin  levels PRN per protocol   Height: 5' 10 (177.8 cm) Weight: 125.9 kg (277 lb 9 oz) IBW/kg (Calculated) : 73  Temp (24hrs), Avg:98 F (36.7 C), Min:97.6 F (36.4 C), Max:98.6 F (37 C)  Recent Labs  Lab 05/10/24 0240 05/10/24 2324 05/11/24 0955 05/11/24 1322 05/11/24 1923 05/11/24 2140 05/12/24 0230 05/13/24 1015 05/14/24 0221  WBC 10.3 16.2*  --   --   --   --  14.5* 16.0* 12.4*  CREATININE 1.76* 1.57*  --   --   --   --  1.32* <0.30* 1.25*  LATICACIDVEN  --   --  1.6 2.5* 1.1 0.8  --   --   --     Estimated Creatinine Clearance: 68 mL/min (A) (by C-G formula based on SCr of 1.25 mg/dL (H)).    Allergies  Allergen Reactions   Buprenorphine Hcl Nausea And Vomiting   Hydromorphone  Other (See Comments)    Mental Status Changes/HALLUCINATIONS   Mirtazapine Other (See Comments)    Nightmares & altered mental status    Codeine Nausea And Vomiting   Morphine  And Codeine Nausea And Vomiting    Antimicrobials this admission: Vanc 11/26> Unasyn  11/26>   Microbiology results: 11/26 BCX ng x 3 days 11/26 MRSA PCR not detected 11/26 Urine culture ngF 11/27 respiratory panel none detected   Thank you for allowing pharmacy to be involved with this patient's care.  Mendel Barter, PharmD PGY1 Clinical Pharmacist [facility]  05/14/2024 12:44 PM

## 2024-05-14 NOTE — Progress Notes (Signed)
   05/14/24 2256  BiPAP/CPAP/SIPAP  BiPAP/CPAP/SIPAP Pt Type Adult  BiPAP/CPAP/SIPAP Resmed  Mask Type Full face mask  Dentures removed? Not applicable  Mask Size Large  EPAP 10 cmH2O  FiO2 (%) 32 %  CPAP/SIPAP surface wiped down Yes  Device Plugged into RED Power Outlet Yes

## 2024-05-15 ENCOUNTER — Inpatient Hospital Stay (HOSPITAL_COMMUNITY)

## 2024-05-15 DIAGNOSIS — N189 Chronic kidney disease, unspecified: Secondary | ICD-10-CM | POA: Diagnosis not present

## 2024-05-15 DIAGNOSIS — N179 Acute kidney failure, unspecified: Secondary | ICD-10-CM | POA: Diagnosis not present

## 2024-05-15 DIAGNOSIS — I4891 Unspecified atrial fibrillation: Secondary | ICD-10-CM | POA: Diagnosis not present

## 2024-05-15 LAB — BASIC METABOLIC PANEL WITH GFR
Anion gap: 9 (ref 5–15)
BUN: 14 mg/dL (ref 8–23)
CO2: 22 mmol/L (ref 22–32)
Calcium: 8.1 mg/dL — ABNORMAL LOW (ref 8.9–10.3)
Chloride: 103 mmol/L (ref 98–111)
Creatinine, Ser: 1.05 mg/dL (ref 0.61–1.24)
GFR, Estimated: >60 mL/min (ref >60)
Glucose, Bld: 164 mg/dL — ABNORMAL HIGH (ref 70–99)
Potassium: 3.3 mmol/L — ABNORMAL LOW (ref 3.5–5.1)
Sodium: 134 mmol/L — ABNORMAL LOW (ref 135–145)

## 2024-05-15 LAB — CBC
HCT: 27.9 % — ABNORMAL LOW (ref 39.0–52.0)
Hemoglobin: 8.2 g/dL — ABNORMAL LOW (ref 13.0–17.0)
MCH: 21.9 pg — ABNORMAL LOW (ref 26.0–34.0)
MCHC: 29.4 g/dL — ABNORMAL LOW (ref 30.0–36.0)
MCV: 74.4 fL — ABNORMAL LOW (ref 80.0–100.0)
Platelets: 335 K/uL (ref 150–400)
RBC: 3.75 MIL/uL — ABNORMAL LOW (ref 4.22–5.81)
RDW: 18.2 % — ABNORMAL HIGH (ref 11.5–15.5)
WBC: 12.3 K/uL — ABNORMAL HIGH (ref 4.0–10.5)
nRBC: 0 % (ref 0.0–0.2)

## 2024-05-15 LAB — GLUCOSE, CAPILLARY
Glucose-Capillary: 162 mg/dL — ABNORMAL HIGH (ref 70–99)
Glucose-Capillary: 273 mg/dL — ABNORMAL HIGH (ref 70–99)
Glucose-Capillary: 383 mg/dL — ABNORMAL HIGH (ref 70–99)

## 2024-05-15 LAB — TROPONIN I (HIGH SENSITIVITY): Troponin I (High Sensitivity): 7 ng/L (ref <18)

## 2024-05-15 LAB — HEPARIN LEVEL (UNFRACTIONATED)
Heparin Unfractionated: 0.26 [IU]/mL — ABNORMAL LOW (ref 0.30–0.70)
Heparin Unfractionated: 0.31 [IU]/mL (ref 0.30–0.70)

## 2024-05-15 MED ORDER — POTASSIUM CHLORIDE 20 MEQ PO PACK
40.0000 meq | PACK | Freq: Once | ORAL | Status: DC
  Filled 2024-05-15: qty 2

## 2024-05-15 MED ORDER — POTASSIUM CHLORIDE 10 MEQ/100ML IV SOLN
10.0000 meq | INTRAVENOUS | Status: AC
  Administered 2024-05-15 (×3): 10 meq via INTRAVENOUS
  Filled 2024-05-15 (×3): qty 100

## 2024-05-15 MED ORDER — INSULIN GLARGINE-YFGN 100 UNIT/ML ~~LOC~~ SOLN
15.0000 [IU] | Freq: Every morning | SUBCUTANEOUS | Status: DC
  Administered 2024-05-15: 15 [IU] via SUBCUTANEOUS
  Filled 2024-05-15 (×2): qty 0.15

## 2024-05-15 MED ORDER — FUROSEMIDE 10 MG/ML IJ SOLN
20.0000 mg | Freq: Once | INTRAMUSCULAR | Status: AC
  Administered 2024-05-15: 20 mg via INTRAVENOUS
  Filled 2024-05-15: qty 2

## 2024-05-15 MED ORDER — DOXEPIN HCL 10 MG PO CAPS
10.0000 mg | ORAL_CAPSULE | Freq: Every day | ORAL | Status: DC
  Administered 2024-05-15 – 2024-05-20 (×6): 20 mg via ORAL
  Filled 2024-05-15 (×6): qty 2

## 2024-05-15 MED ORDER — METHYLPREDNISOLONE SODIUM SUCC 40 MG IJ SOLR
20.0000 mg | Freq: Once | INTRAMUSCULAR | Status: AC
  Administered 2024-05-15: 20 mg via INTRAVENOUS
  Filled 2024-05-15: qty 1

## 2024-05-15 NOTE — Progress Notes (Signed)
 PHARMACY - ANTICOAGULATION CONSULT NOTE  Pharmacy Consult for heparin  (transition from Xarelto ) Indication: atrial fibrillation  Allergies  Allergen Reactions   Buprenorphine Hcl Nausea And Vomiting   Hydromorphone  Other (See Comments)    Mental Status Changes/HALLUCINATIONS   Mirtazapine Other (See Comments)    Nightmares & altered mental status    Codeine Nausea And Vomiting   Morphine  And Codeine Nausea And Vomiting    Patient Measurements: Height: 5' 10 (177.8 cm) Weight: 125.9 kg (277 lb 9 oz) IBW/kg (Calculated) : 73 HEPARIN  DW (KG): 101.6  Vital Signs: Temp: 97.7 F (36.5 C) (11/30 1120) Temp Source: Oral (11/30 1120) BP: 106/76 (11/30 1238) Pulse Rate: 103 (11/30 1308)  Labs: Recent Labs    05/13/24 1015 05/13/24 1015 05/14/24 0221 05/14/24 1120 05/14/24 2058 05/15/24 0200 05/15/24 1610  HGB 8.9*  --  7.9*  --   --  8.2*  --   HCT 31.1*  --  27.2*  --   --  27.9*  --   PLT 344  --  328  --   --  335  --   APTT  --   --  62* 44* 68*  --   --   HEPARINUNFRC  --    < > 0.64 0.30 0.32 0.26* 0.31  CREATININE <0.30*  --  1.25*  --   --  1.05  --   TROPONINIHS  --   --   --   --   --   --  7   < > = values in this interval not displayed.    Estimated Creatinine Clearance: 81 mL/min (by C-G formula based on SCr of 1.05 mg/dL).   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   05/08/2024   albuterol  (PROVENTIL  HFA;VENTOLIN  HFA) 108 (90 BASE) MCG/ACT inhaler Inhale 2 puffs into the lungs every 4 (four) hours as needed for wheezing or shortness of breath.   05/09/2024 Morning   amiodarone  (PACERONE ) 200 MG tablet Take 1 tablet (200 mg total) by mouth 2 (two) times daily. 60 tablet 3 05/09/2024 Morning   atorvastatin  (LIPITOR ) 80 MG tablet Take 80 mg by mouth at bedtime.   05/08/2024   azithromycin  (ZITHROMAX ) 250 MG tablet Take 250 mg by mouth every Monday, Wednesday, and Friday.   05/09/2024 Morning   budesonide  (PULMICORT ) 0.5 MG/2ML nebulizer solution Take 2 mLs by nebulization 2 (two) times daily.   05/09/2024 Morning   clopidogrel  (PLAVIX ) 75 MG tablet Take 75 mg by mouth daily.   05/09/2024 at  9:00 AM   dapagliflozin propanediol (FARXIGA) 10 MG TABS tablet Take 10 mg by mouth daily.   05/09/2024 Morning   donepezil  (ARICEPT ) 10 MG tablet Take 10 mg by mouth  at bedtime.   05/08/2024   doxepin  (SINEQUAN ) 10 MG capsule Take 10-20 mg by mouth at bedtime.   05/08/2024   insulin  aspart (NOVOLOG  FLEXPEN) 100 UNIT/ML injection Inject 0-13 Units into the skin See admin instructions. Inject 0-13 units into the skin three times a day with meals, PER SLIDING SCALE   05/09/2024 Morning   iron  polysaccharides (NIFEREX) 150 MG capsule Take 150 mg by mouth 2 (two) times daily.   05/09/2024 Morning   isosorbide  mononitrate (IMDUR ) 30 MG 24 hr tablet Take 30 mg by mouth daily.   05/09/2024 Morning   latanoprost  (XALATAN ) 0.005 % ophthalmic solution Place 1 drop into both eyes at bedtime.     05/08/2024   lidocaine  4 % Place 1 patch onto the skin daily. Apply to lower back.   Taking   losartan  (COZAAR ) 25 MG tablet Take 1 tablet (25 mg total) by mouth daily. 90 tablet 2 05/09/2024 Morning   memantine  (NAMENDA ) 10 MG tablet Take 10 mg by mouth 2 (two) times daily.   05/09/2024 Morning   metoprolol  succinate (TOPROL -XL) 50 MG  24 hr tablet Take 1 tablet (50 mg total) by mouth in the morning and at bedtime. Take with or immediately following a meal. 180 tablet 3 05/09/2024 at  9:00 AM   mirabegron  ER (MYRBETRIQ ) 50 MG TB24 tablet Take 50 mg by mouth daily.   05/09/2024 Morning   montelukast  (SINGULAIR ) 10 MG tablet Take 10 mg by mouth at bedtime.   05/08/2024   Multiple Vitamin (MULTIVITAMIN WITH MINERALS) TABS tablet Take 1 tablet by mouth daily with breakfast.   05/09/2024 Morning   Multiple Vitamins-Minerals (PRESERVISION AREDS PO) Take 2 capsules by mouth in the morning.   05/09/2024 Morning   omeprazole (PRILOSEC) 40 MG capsule Take 1 capsule by mouth daily after supper.   05/08/2024   oxybutynin  (DITROPAN -XL) 10 MG 24 hr tablet Take 10 mg by mouth daily.   05/09/2024 Morning   OXYGEN Inhale 3 L into the lungs daily as needed (Shortness of breath, wheezing).   05/09/2024   sertraline  (ZOLOFT ) 100 MG tablet Take 100 mg by mouth daily.   05/09/2024 Morning   spironolactone  (ALDACTONE ) 25 MG tablet Take 25 mg by mouth daily.   05/09/2024 Morning   Teriparatide  560 MCG/2.24ML SOPN Inject 20 mcg into the skin at bedtime.   05/08/2024   torsemide  (DEMADEX ) 20 MG tablet Take 1 tablet (20 mg total) by mouth daily. (Patient taking differently: Take 20 mg by mouth 2 (two) times daily at 10 AM and 5 PM.)   05/09/2024 Morning   TRESIBA  FLEXTOUCH 100 UNIT/ML FlexTouch Pen Inject 40-46 Units into the skin See admin instructions. Inject 40 units into the skin in the morning and 46 units at bedtime   05/09/2024 Morning   WIXELA INHUB 250-50 MCG/ACT AEPB Inhale 2 puffs into the lungs in the morning.   05/09/2024 Morning   XARELTO  15 MG TABS tablet Take 15 mg by mouth daily with supper.   Taking   benzonatate  (TESSALON ) 200 MG capsule Take 1 capsule (200 mg total) by mouth 3 (three) times daily as needed for cough. (Patient not taking: Reported on 05/10/2024) 20 capsule 0 Not Taking   Scheduled:   atorvastatin   80 mg Oral QHS    budesonide   2 mL Nebulization BID   budesonide -formoterol   2 puff Inhalation BID   clopidogrel   75 mg Oral Daily   digoxin   0.125 mg Oral Daily   docusate sodium   100 mg  Oral BID   donepezil   10 mg Oral QHS   doxepin   10-20 mg Oral QHS   feeding supplement  1 Container Oral BID BM   guaiFENesin   1,200 mg Oral BID   insulin  aspart  0-5 Units Subcutaneous QHS   insulin  aspart  0-6 Units Subcutaneous TID WC   insulin  glargine-yfgn  15 Units Subcutaneous q morning   iron  polysaccharides  150 mg Oral BID   isosorbide  mononitrate  30 mg Oral Daily   memantine   10 mg Oral BID   metoprolol  succinate  100 mg Oral BID   mirabegron  ER  50 mg Oral Daily   montelukast   10 mg Oral QHS   oxybutynin   10 mg Oral QHS   pantoprazole  (PROTONIX ) IV  40 mg Intravenous Q12H   sertraline   100 mg Oral Daily   sodium chloride  flush  3 mL Intravenous Q12H   sodium chloride  flush  3 mL Intravenous Q12H    Assessment: 73 YOM w/ PMH significant for AF on Xarelto  PTA [last known dose 11/27 1659] who needs heparin  therapy 2/2 need for EGD w/ esophageal dilation. No history noted for stroke, intracranial bleed, or GIB from review of chart. He had an ICD placed 03/01/2024  Heparin  level therapeutic at 0.31, will increase slightly to keep in range.  Goal of Therapy:  Heparin  level 0.3-0.7 units/ml Monitor platelets by anticoagulation protocol: Yes   Plan:  Increase heparin  1950 units/h Daily heparin  level and CBC  Ozell Jamaica, PharmD, BCPS, [facility] Clinical Pharmacist [phone] Please check AMION for all [facility] Pharmacy numbers 05/15/2024

## 2024-05-15 NOTE — Progress Notes (Signed)
  Progress Note  Patient Name: Mike Hunter Date of Encounter: 05/15/2024 [facility] Cardiologist: Jerel Balding, MD   Interval Summary   Continues to complain of aspiration with his clear liquid diet. Otherwise no new or acute complaints.  Vital Signs Vitals:   05/15/24 0014 05/15/24 0400 05/15/24 0836 05/15/24 0844  BP: 125/78 114/65  115/65  Pulse:  (!) 102 90 (!) 101  Resp: (!) 24 (!) 22 (!) 21 20  Temp: 98.9 F (37.2 C) 98.9 F (37.2 C)  98.1 F (36.7 C)  TempSrc: Axillary Axillary  Oral  SpO2: 99% 99% 100% 100%  Weight:      Height:        Intake/Output Summary (Last 24 hours) at 05/15/2024 0915 Last data filed at 05/15/2024 0911 Gross per 24 hour  Intake 3155.25 ml  Output 950 ml  Net 2205.25 ml      05/13/2024    3:00 PM 05/09/2024   11:00 PM 04/07/2024    5:31 AM  Last 3 Weights  Weight (lbs) 277 lb 9 oz 277 lb 9 oz 277 lb 9.6 oz  Weight (kg) 125.9 kg 125.9 kg 125.919 kg      Telemetry/ECG  Predominantly rate controlled AF - Personally Reviewed  Physical Exam  General: Chronically ill appearing, in no acute distress.  Neck: No JVD.  Cardiac: Tachycardic, irregular rhythm.  Resp: Normal work of breathing.  Ext: No edema.  Neuro: No gross focal deficits.  Psych: Normal affect.   Assessment & Plan  Mike Hunter is a 73 y.o. 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    Unfortunately, found to have likely aspiration pneumonia with fever and leukocytosis. Ongoing aspiration events.    Problem List:  AF/RVR AFL Sustained VT s/p LEFT BSCI DC ICD (DOI 03/01/24)  HFrEF CAD s/p LAD PCI  Aspiration pneumonia   Plan: -Continue amiodarone  for now. Continue metoprolol  XL 100mg  BID.  Continue digoxin  0.125mg  daily which appears to have  helped with rate control; hopefully, will improve further after receiving more doses and becoming therapeutic.   -Transitioned to heparin  in lieu of Xarelto  for possible upcoming procedures.  -Will need antibiotic course for any infectious issues to be complete prior to any device upgrade. Still with leukocytosis but no fevers. -Ultimately, likely to have AVN ablation and upgrade to CRT, hopefully prior to discharge, given his poor functional status and co-morbidities. Will need to be able to lay flat for procedure without significant risk of aspiration. This may require GI intervention beforehand. He is having an esophogram tomorrow.    Signed, Fonda Kitty, MD

## 2024-05-15 NOTE — Progress Notes (Signed)
   Notified by RN that patient had a severe episode of chest pain prior to esophagram.  Reviewed note from IR PA.  Patient reported chest pain prior to esophagram that suddenly worsened to an 8 /10 pain while down in IR.  He then began to have audible wheezing.  Therefore, procedure was aborted. Repeat EKG showed atrial flutter, rate 117 bpm, with no acute ischemic changes.  I went to bedside to evaluate patient.  He is resting comfortably.  He is currently chest pain-free and reports a central chest heaviness with palp patient to that area.  He states this is the same type of pain that he had while down in IR.  Spoke with nurse, he continues to struggle with aspiration and is coughing significantly.  I suspect this is musculoskeletal pain from coughing/aspiration.  Out of abundance of caution, I will repeat a troponin given the sudden severe episode of pain down in IR (that did not occur with palpation) but do not think this is cardiac in nature.  Will defer management of coughing and aspiration to primary team.  Rates currently in the low 100s on telemetry. Please see Dr. Shaune rounding note from earlier today for more information on plans regarding atrial flutter. He is currently on the schedule for an atrial fibrillation ablation tomorrow but this may need to be postpone given continue issues with aspiration.  Yatzari Jonsson E Meleana Commerford, PA-C 05/15/2024 2:31 PM

## 2024-05-15 NOTE — Progress Notes (Signed)
 PHARMACY - ANTICOAGULATION CONSULT NOTE  Pharmacy Consult for heparin  (transition from Xarelto ) Indication: atrial fibrillation  Allergies  Allergen Reactions   Buprenorphine Hcl Nausea And Vomiting   Hydromorphone  Other (See Comments)    Mental Status Changes/HALLUCINATIONS   Mirtazapine Other (See Comments)    Nightmares & altered mental status    Codeine Nausea And Vomiting   Morphine  And Codeine Nausea And Vomiting    Patient Measurements: Height: 5' 10 (177.8 cm) Weight: 125.9 kg (277 lb 9 oz) IBW/kg (Calculated) : 73 HEPARIN  DW (KG): 101.6  Vital Signs: Temp: 98.9 F (37.2 C) (11/30 0400) Temp Source: Axillary (11/30 0400) BP: 114/65 (11/30 0400) Pulse Rate: 102 (11/30 0400)  Labs: Recent Labs    05/13/24 1015 05/13/24 1015 05/14/24 0221 05/14/24 1120 05/14/24 2058 05/15/24 0200  HGB 8.9*  --  7.9*  --   --  8.2*  HCT 31.1*  --  27.2*  --   --  27.9*  PLT 344  --  328  --   --  335  APTT  --   --  62* 44* 68*  --   HEPARINUNFRC  --    < > 0.64 0.30 0.32 0.26*  CREATININE <0.30*  --  1.25*  --   --  1.05   < > = values in this interval not displayed.    Estimated Creatinine Clearance: 81 mL/min (by C-G formula based on SCr of 1.05 mg/dL).   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   05/08/2024   albuterol   (PROVENTIL  HFA;VENTOLIN  HFA) 108 (90 BASE) MCG/ACT inhaler Inhale 2 puffs into the lungs every 4 (four) hours as needed for wheezing or shortness of breath.   05/09/2024 Morning   amiodarone  (PACERONE ) 200 MG tablet Take 1 tablet (200 mg total) by mouth 2 (two) times daily. 60 tablet 3 05/09/2024 Morning   atorvastatin  (LIPITOR ) 80 MG tablet Take 80 mg by mouth at bedtime.   05/08/2024   azithromycin  (ZITHROMAX ) 250 MG tablet Take 250 mg by mouth every Monday, Wednesday, and Friday.   05/09/2024 Morning   budesonide  (PULMICORT ) 0.5 MG/2ML nebulizer solution Take 2 mLs by nebulization 2 (two) times daily.   05/09/2024 Morning   clopidogrel  (PLAVIX ) 75 MG tablet Take 75 mg by mouth daily.   05/09/2024 at  9:00 AM   dapagliflozin propanediol (FARXIGA) 10 MG TABS tablet Take 10 mg by mouth daily.   05/09/2024 Morning   donepezil  (ARICEPT ) 10 MG tablet Take 10 mg by mouth at bedtime.   05/08/2024   doxepin  (SINEQUAN ) 10 MG capsule Take 10-20 mg by mouth at bedtime.   05/08/2024   insulin  aspart (NOVOLOG  FLEXPEN) 100 UNIT/ML injection Inject 0-13 Units into the skin See admin  instructions. Inject 0-13 units into the skin three times a day with meals, PER SLIDING SCALE   05/09/2024 Morning   iron  polysaccharides (NIFEREX) 150 MG capsule Take 150 mg by mouth 2 (two) times daily.   05/09/2024 Morning   isosorbide  mononitrate (IMDUR ) 30 MG 24 hr tablet Take 30 mg by mouth daily.   05/09/2024 Morning   latanoprost  (XALATAN ) 0.005 % ophthalmic solution Place 1 drop into both eyes at bedtime.     05/08/2024   lidocaine  4 % Place 1 patch onto the skin daily. Apply to lower back.   Taking   losartan  (COZAAR ) 25 MG tablet Take 1 tablet (25 mg total) by mouth daily. 90 tablet 2 05/09/2024 Morning   memantine  (NAMENDA ) 10 MG tablet Take 10 mg by mouth 2 (two) times daily.   05/09/2024 Morning   metoprolol  succinate (TOPROL -XL) 50 MG 24 hr tablet Take 1 tablet (50 mg total) by mouth in the morning and at bedtime. Take  with or immediately following a meal. 180 tablet 3 05/09/2024 at  9:00 AM   mirabegron  ER (MYRBETRIQ ) 50 MG TB24 tablet Take 50 mg by mouth daily.   05/09/2024 Morning   montelukast  (SINGULAIR ) 10 MG tablet Take 10 mg by mouth at bedtime.   05/08/2024   Multiple Vitamin (MULTIVITAMIN WITH MINERALS) TABS tablet Take 1 tablet by mouth daily with breakfast.   05/09/2024 Morning   Multiple Vitamins-Minerals (PRESERVISION AREDS PO) Take 2 capsules by mouth in the morning.   05/09/2024 Morning   omeprazole (PRILOSEC) 40 MG capsule Take 1 capsule by mouth daily after supper.   05/08/2024   oxybutynin  (DITROPAN -XL) 10 MG 24 hr tablet Take 10 mg by mouth daily.   05/09/2024 Morning   OXYGEN Inhale 3 L into the lungs daily as needed (Shortness of breath, wheezing).   05/09/2024   sertraline  (ZOLOFT ) 100 MG tablet Take 100 mg by mouth daily.   05/09/2024 Morning   spironolactone  (ALDACTONE ) 25 MG tablet Take 25 mg by mouth daily.   05/09/2024 Morning   Teriparatide  560 MCG/2.24ML SOPN Inject 20 mcg into the skin at bedtime.   05/08/2024   torsemide  (DEMADEX ) 20 MG tablet Take 1 tablet (20 mg total) by mouth daily. (Patient taking differently: Take 20 mg by mouth 2 (two) times daily at 10 AM and 5 PM.)   05/09/2024 Morning   TRESIBA  FLEXTOUCH 100 UNIT/ML FlexTouch Pen Inject 40-46 Units into the skin See admin instructions. Inject 40 units into the skin in the morning and 46 units at bedtime   05/09/2024 Morning   WIXELA INHUB 250-50 MCG/ACT AEPB Inhale 2 puffs into the lungs in the morning.   05/09/2024 Morning   XARELTO  15 MG TABS tablet Take 15 mg by mouth daily with supper.   Taking   benzonatate  (TESSALON ) 200 MG capsule Take 1 capsule (200 mg total) by mouth 3 (three) times daily as needed for cough. (Patient not taking: Reported on 05/10/2024) 20 capsule 0 Not Taking   Scheduled:   atorvastatin   80 mg Oral QHS   budesonide   2 mL Nebulization BID   budesonide -formoterol   2 puff Inhalation BID    clopidogrel   75 mg Oral Daily   digoxin   0.125 mg Oral Daily   docusate sodium   100 mg Oral BID   donepezil   10 mg Oral QHS   feeding supplement  1 Container Oral BID BM   guaiFENesin   1,200 mg Oral BID   insulin  aspart  0-5 Units Subcutaneous QHS  insulin  aspart  0-6 Units Subcutaneous TID WC   insulin  glargine-yfgn  40 Units Subcutaneous q morning   iron  polysaccharides  150 mg Oral BID   isosorbide  mononitrate  30 mg Oral Daily   memantine   10 mg Oral BID   metoprolol  succinate  100 mg Oral BID   mirabegron  ER  50 mg Oral Daily   montelukast   10 mg Oral QHS   oxybutynin   10 mg Oral QHS   pantoprazole  (PROTONIX ) IV  40 mg Intravenous Q12H   sertraline   100 mg Oral Daily   sodium chloride  flush  3 mL Intravenous Q12H   sodium chloride  flush  3 mL Intravenous Q12H    Assessment: 73 YOM w/ PMH significant for AF on Xarelto  PTA [last known dose 11/27 1659] who needs heparin  therapy 2/2 need for EGD w/ esophageal dilation. No history noted for stroke, intracranial bleed, or GIB from review of chart. 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 Hgb (8.2) and PLTs (335) are stable. Patient renal function is slightly improved. Per RN, no report of pauses, issues with the line, or signs of bleeding.   Goal of Therapy:  Heparin  level 0.3-0.7 units/ml Monitor platelets by anticoagulation protocol: Yes   Plan:  Increase heparin  to 1900 units/hr Recheck heparin  level after 8 hours to confirm therapeutic Monitor daily heparin  levels and CBC Monitor for any signs/symptoms of bleeding   Thank you for allowing pharmacy to be involved with this patient's care.  Mendel Barter, PharmD PGY1 Clinical Pharmacist [facility]  05/15/2024 7:23 AM

## 2024-05-15 NOTE — Progress Notes (Addendum)
 Patient transferred to Radiology for Esophogram.  VS remained stable throughout transfer to radiology.   Once patient transferred to table and laid flat, pt began to c/o 8/10 chest pain and pointing to heart.  Audible wheezing noted, HOB was elevated, pt still c/o chest pain.  Attempted to contact floor/ RN. After discussion with PA, it was decided to abort procedure.    Patient was transported back to room, report given to bedside RN.  BP 106/76, HR 103 appears A-Fib on monitor.

## 2024-05-15 NOTE — Progress Notes (Addendum)
  IR BRIEF PROGRESS NOTE:  Patient was requested for an Esophagram on 11/26 @13 :34. IR was unable to accommodate on 11/17. IR is also unable to accommodate routine Fluoroscopy requests over Holidays and weekends as IR is available on call for emergencies only.  IR team was made available today, and therefore, patient's esophagram was attempted.  On arrival to Fluoroscopy, patient immediately mentioned experiencing bothersome chest pain, and pointing to his left chest/heart. Patient mentioned he had been having chest pains since yesterday, though he felt worse this am. While attempting to gather more information from transport RN, patient stated his chest pain suddenly worsened, now an 8/10. Patient also began with audible wheezing. Monitor with A.fib- like wave form, though pulse unchanged from arrival at dept. Transport RN was unable to reach [employer] on floor.   Out of an abundance of caution, with patient in noticeable discomfort, esophagram was aborted, and patient transferred back to his bed for prompt transport back up to unit. Patient did not receive any radiation or oral contrast while in Fluoroscopy suite.  IR will re-attempt esophagram tomorrow, 12/1, if patient is deems stable and appropriate.   Electronically Signed: Carlin DELENA Griffon, PA-C 05/15/2024, 12:59 PM

## 2024-05-15 NOTE — Plan of Care (Signed)

## 2024-05-15 NOTE — Plan of Care (Signed)
  Problem: Education: Goal: Ability to describe self-care measures that may prevent or decrease complications (Diabetes Survival Skills Education) will improve 05/15/2024 0705 by Durenda Lieu, RN Outcome: Progressing 05/15/2024 0704 by Durenda Lieu, RN Outcome: Progressing Goal: Individualized Educational Video(s) 05/15/2024 0705 by Durenda Lieu, RN Outcome: Progressing 05/15/2024 0704 by Durenda Lieu, RN Outcome: Progressing   Problem: Coping: Goal: Ability to adjust to condition or change in health will improve 05/15/2024 0705 by Durenda Lieu, RN Outcome: Progressing 05/15/2024 0704 by Durenda Lieu, RN Outcome: Progressing   Problem: Fluid Volume: Goal: Ability to maintain a balanced intake and output will improve 05/15/2024 0705 by Durenda Lieu, RN Outcome: Progressing 05/15/2024 0704 by Durenda Lieu, RN Outcome: Progressing   Problem: Health Behavior/Discharge Planning: Goal: Ability to identify and utilize available resources and services will improve 05/15/2024 0705 by Durenda Lieu, RN Outcome: Progressing 05/15/2024 0704 by Durenda Lieu, RN Outcome: Progressing Goal: Ability to manage health-related needs will improve 05/15/2024 0705 by Durenda Lieu, RN Outcome: Progressing 05/15/2024 0704 by Durenda Lieu, RN Outcome: Progressing   Problem: Metabolic: Goal: Ability to maintain appropriate glucose levels will improve Outcome: Progressing

## 2024-05-15 NOTE — Progress Notes (Signed)
 PROGRESS NOTE    Mike Hunter  FMW:[id number] DOB: 1951-01-25 DOA: 05/09/2024 PCP: Nikki Hansel Atlas, MD   Brief Narrative: 73 year old with past medical history significant for COPD, paroxysmal A-fib status post pacemaker on Xarelto , heart failure reduced ejection fraction 35 to 40%, CKD 3B, C5-C7 fusion surgery with hardware placement, chronic anemia, hyperlipidemia, urinary incontinence, anemia of chronic disease, bipolar disorder, dementia, obstructive sleep apnea on CPAP, morbid obesity, insulin -dependent diabetes type 2 presented to the ED complaining of falling out of his bed and ICD firing.  Chest x-ray showed bibasilar atelectasis.  Cardiomegaly without overt heart failure.  CT chest abdomen and pelvis no acute intrathoracic and intra-abdominal pelvic pathology.  Patchy dependent bilateral lower lobe consolidation, favor atelectasis over airspace disease.  CT cervical spine no acute abnormality.  Stable appearing anterior fusion hardware from C5-C7.  CT head no acute intracranial abnormality. Seen by cardiology no stress test needed.  He is a scheduled for cardiac ablation 12/1 as an outpatient.  During hospitalization patient develops A-fib with RVR, he was started on IV amiodarone .He develops  fever, cough, Started on IV Unasyn . Concern for aspiration, concern for esophageal dysphagia. Plan to consult GI>    Assessment & Plan:   Principal Problem:   Acute kidney injury superimposed on chronic kidney disease Active Problems:   Acute kidney injury superimposed on stage 3b chronic kidney disease (HCC)   Fall at home, initial encounter   HFrEF (heart failure with reduced ejection fraction) (HCC)   Insulin  dependent type 2 diabetes mellitus (HCC)   Hyperlipidemia   History of COPD   History of dementia   History of fusion of cervical spine   Chronic anemia   History of bipolar disorder   Obstructive sleep apnea   Atrial fibrillation with RVR (HCC)   Aspiration pneumonia (HCC)    Dysphagia   Globus sensation   Anticoagulated   Antiplatelet or antithrombotic long-term use   Persistent atrial fibrillation (HCC)   ICD (implantable cardioverter-defibrillator) in place   Chronic systolic heart failure (HCC)  1-AKI on superimposed CKD stage IIIb: - CT abdomen and pelvis no acute intrathoracic abdominal or pelvic finding. - Holding torsemide , Jardiance and losartan . - Received IV fluids. -Cr base line 1.4--1.6 Renal function back to baseline.   Aspiration PNA.  Had  fever, cough, leukocytosis . CT showed atelectasis vs infiltrates bases.  Report dysphagia.  Speech consulted. Concern for esophageal dysphagia.  Continue with  IV Protonix .  Continue with IV Unasyn , vancomycin .  Blood culture: no growth to date.  . Lactic acid normalized.  Respiratory panel negative. Covid negative 11/28: report aspiration event in the morning after drinking water.  -11/18 chest x ray. Shows New mild right upper lobe airspace opacity, suspicious for pneumonia. -Continue with antibiotics.  -11/30: report aspiration this am, per nurse he had some cough, was very anxious.  Plan for esophagogram tomorrow. Continue with antibiotics.   Paroxysmal A-fib status post pacemaker placement On amiodarone  and Toprol , Xarelto  Initially scheduled for cardiac ablation 12/1 Developed A-fib with RVR and was started on IV amiodarone  Cardiology consulted.  Continue with amiodarone  gtt.  Started on  digoxin .   Dysphagia;  Evaluated by speech, think more esophageal dysphagia.  Esophagogram ordered.  IV protonix .  Recurrent aspiration event.  GI consulted.   Mechanical fall at home/physical deconditioning -PT OT consulted  Chest pain;  Sharp in quality EKG ordered.  Troponin negative.  Cardiology following. Associated with a ib, and pr PNA On IV amiodarone  for A fib  Heart failure reduced ejection fraction, POA, chronic NYHA III, ejection fraction 35 to 40% - Continue Toprol  and  Imdur  Holding torsemide  Jardiance and losartan  and spironolactone  due to AKI -Received IV lasix  11/27 11/28 -negative 4 L -Report dyspnea, will give a dose of lasix  today.   COPD Report chest tightness.  Continue with nebulizer.  Will give a dose of IV solumedrol.   Insulin -dependent diabetes type 2: - On Tresiba   History of dementia: - Continue memantine  and donezepil  Obstructive sleep apnea on CPAP - Continue CPAP at bedtime  Hyperlipidemia: - Continue atorvastatin   History of bipolar disorder/depression - Continue Zoloft   History of chronic anemia - Monitor hemoglobin  Hypokalemia; replete IV   Estimated body mass index is 39.83 kg/m as calculated from the following:   Height as of this encounter: 5' 10 (1.778 m).   Weight as of this encounter: 125.9 kg.   DVT prophylaxis: Xarelto  Code Status: Full code Family Communication: Care discussed with patient, wife over phone 11/29.  Disposition Plan:  Status is: Inpatient Remains inpatient appropriate because: Management of A-fib RVR, fever leukocytosis    Consultants:  Urology  Procedures:    Antimicrobials:    Subjective: Report having an episode of cough, SOB after eating jello.  He did good yesterday with liquids.  He was placed on his CPAP/.  He is feeling a little better.  Report- chest tightness, dyspnea. Will give him steroids and lasix  today   Objective: Vitals:   05/14/24 1500 05/14/24 2006 05/15/24 0014 05/15/24 0400  BP: 131/84 135/78 125/78 114/65  Pulse: (!) 102 (!) 102  (!) 102  Resp: (!) 23 20 (!) 24 (!) 22  Temp: 98 F (36.7 C) 97.8 F (36.6 C) 98.9 F (37.2 C) 98.9 F (37.2 C)  TempSrc: Oral Oral Axillary Axillary  SpO2: 100% 99% 99% 99%  Weight:      Height:        Intake/Output Summary (Last 24 hours) at 05/15/2024 0743 Last data filed at 05/15/2024 0525 Gross per 24 hour  Intake 3155.25 ml  Output 500 ml  Net 2655.25 ml   Filed Weights   05/09/24 2300 05/13/24  1500  Weight: 125.9 kg 125.9 kg    Examination:  General exam: NAD Respiratory system: Normal resp effort, sporadic wheezing.  Cardiovascular system: S 1, S 2 RRR Gastrointestinal system: BS present, soft, nt Central nervous system: Alert, conversant, follows command Extremities: no edema   Data Reviewed: I have personally reviewed following labs and imaging studies  CBC: Recent Labs  Lab 05/10/24 2324 05/12/24 0230 05/13/24 1015 05/14/24 0221 05/15/24 0200  WBC 16.2* 14.5* 16.0* 12.4* 12.3*  NEUTROABS 14.0*  --   --   --   --   HGB 9.9* 8.7* 8.9* 7.9* 8.2*  HCT 34.3* 29.7* 31.1* 27.2* 27.9*  MCV 74.7* 75.0* 74.9* 74.5* 74.4*  PLT 358 275 344 328 335   Basic Metabolic Panel: Recent Labs  Lab 05/10/24 2324 05/12/24 0230 05/13/24 1015 05/14/24 0221 05/15/24 0200  NA 140 136 138 138 134*  K 3.9 3.8 3.9 3.2* 3.3*  CL 105 105 106 107 103  CO2 25 23 21* 21* 22  GLUCOSE 208* 171* 271* 167* 164*  BUN 19 16 18 16 14   CREATININE 1.57* 1.32* <0.30* 1.25* 1.05  CALCIUM  8.6* 8.3* 8.3* 8.0* 8.1*  MG 2.5*  --  2.1  --   --    GFR: Estimated Creatinine Clearance: 81 mL/min (by C-G formula based on SCr of 1.05  mg/dL). Liver Function Tests: Recent Labs  Lab 05/09/24 1832 05/10/24 0240  AST 25 19  ALT 31 28  ALKPHOS 85 81  BILITOT 0.8 0.7  PROT 6.4* 6.0*  ALBUMIN  3.0* 2.8*   No results for input(s): LIPASE, AMYLASE in the last 168 hours. No results for input(s): AMMONIA in the last 168 hours. Coagulation Profile: No results for input(s): INR, PROTIME in the last 168 hours. Cardiac Enzymes: No results for input(s): CKTOTAL, CKMB, CKMBINDEX, TROPONINI in the last 168 hours. BNP (last 3 results) Recent Labs    02/25/24 1520 04/05/24 1343 04/06/24 0554  PROBNP 2,137.0* 2,191.0* 2,599.0*   HbA1C: No results for input(s): HGBA1C in the last 72 hours. CBG: Recent Labs  Lab 05/14/24 0600 05/14/24 1117 05/14/24 1600 05/14/24 2152  05/15/24 0626  GLUCAP 146* 268* 299* 172* 162*   Lipid Profile: No results for input(s): CHOL, HDL, LDLCALC, TRIG, CHOLHDL, LDLDIRECT in the last 72 hours. Thyroid Function Tests: No results for input(s): TSH, T4TOTAL, FREET4, T3FREE, THYROIDAB in the last 72 hours.  Anemia Panel: No results for input(s): VITAMINB12, FOLATE, FERRITIN, TIBC, IRON , RETICCTPCT in the last 72 hours. Sepsis Labs: Recent Labs  Lab 05/11/24 0955 05/11/24 1322 05/11/24 1923 05/11/24 2140  LATICACIDVEN 1.6 2.5* 1.1 0.8    Recent Results (from the past 240 hours)  Urine Culture (for pregnant, neutropenic or urologic patients or patients with an indwelling urinary catheter)     Status: None   Collection Time: 05/11/24 10:34 AM   Specimen: Urine, Clean Catch  Result Value Ref Range Status   Specimen Description URINE, CLEAN CATCH  Final   Special Requests NONE  Final   Culture   Final    NO GROWTH Performed at [facility], [address], [place], [place] [address]    Report Status 05/13/2024 FINAL  Final  Culture, blood (Routine X 2) w Reflex to ID Panel     Status: None (Preliminary result)   Collection Time: 05/11/24 10:37 AM   Specimen: BLOOD  Result Value Ref Range Status   Specimen Description BLOOD RIGHT ANTECUBITAL  Final   Special Requests   Final    BOTTLES DRAWN AEROBIC ONLY Blood Culture results may not be optimal due to an inadequate volume of blood received in culture bottles   Culture   Final    NO GROWTH 3 DAYS Performed at [facility], [address], [place], [place] [address]    Report Status PENDING  Incomplete  Culture, blood (Routine X 2) w Reflex to ID Panel     Status: None (Preliminary result)   Collection Time: 05/11/24 10:43 AM   Specimen: BLOOD  Result Value Ref Range Status   Specimen Description BLOOD RIGHT ANTECUBITAL  Final   Special Requests   Final    BOTTLES DRAWN AEROBIC AND ANAEROBIC Blood Culture adequate  volume   Culture   Final    NO GROWTH 3 DAYS Performed at [facility], [address] [place], [place] [address]    Report Status PENDING  Incomplete  MRSA Next Gen by PCR, Nasal     Status: None   Collection Time: 05/11/24  6:47 PM   Specimen: Nasal Mucosa; Nasal Swab  Result Value Ref Range Status   MRSA by PCR Next Gen NOT DETECTED NOT DETECTED Final    Comment: (NOTE) The GeneXpert MRSA Assay (FDA approved for NASAL specimens only), is one component of a comprehensive MRSA colonization surveillance program. It is  not intended to diagnose MRSA infection nor to guide or monitor treatment for MRSA infections. Test performance is not FDA approved in patients less than 88 years old. Performed at [facility], [address], [place], [place] [address]   Respiratory (~20 pathogens) panel by PCR     Status: None   Collection Time: 05/12/24  7:26 AM   Specimen: Nasopharyngeal Swab; Respiratory  Result Value Ref Range Status   Adenovirus NOT DETECTED NOT DETECTED Final   Coronavirus 229E NOT DETECTED NOT DETECTED Final    Comment: (NOTE) The Coronavirus on the Respiratory Panel, DOES NOT test for the novel  Coronavirus (2019 nCoV)    Coronavirus HKU1 NOT DETECTED NOT DETECTED Final   Coronavirus NL63 NOT DETECTED NOT DETECTED Final   Coronavirus OC43 NOT DETECTED NOT DETECTED Final   Metapneumovirus NOT DETECTED NOT DETECTED Final   Rhinovirus / Enterovirus NOT DETECTED NOT DETECTED Final   Influenza A NOT DETECTED NOT DETECTED Final   Influenza B NOT DETECTED NOT DETECTED Final   Parainfluenza Virus 1 NOT DETECTED NOT DETECTED Final   Parainfluenza Virus 2 NOT DETECTED NOT DETECTED Final   Parainfluenza Virus 3 NOT DETECTED NOT DETECTED Final   Parainfluenza Virus 4 NOT DETECTED NOT DETECTED Final   Respiratory Syncytial Virus NOT DETECTED NOT DETECTED Final   Bordetella pertussis NOT DETECTED NOT DETECTED Final   Bordetella Parapertussis NOT DETECTED NOT DETECTED  Final   Chlamydophila pneumoniae NOT DETECTED NOT DETECTED Final   Mycoplasma pneumoniae NOT DETECTED NOT DETECTED Final    Comment: Performed at [facility], [address] [place], [place] [address]  SARS Coronavirus 2 by RT PCR (hospital order, performed in [facility] hospital lab) *cepheid single result test* Anterior Nasal Swab     Status: None   Collection Time: 05/13/24  3:11 PM   Specimen: Anterior Nasal Swab  Result Value Ref Range Status   SARS Coronavirus 2 by RT PCR NEGATIVE NEGATIVE Final    Comment: Performed at [facility], [address], [place], [place] [address]         Radiology Studies: DG Swallowing Func-Speech Pathology Result Date: 05/13/2024 Table formatting from the original result was not included. Modified Barium Swallow Study Patient Details Name: ADAEL CULBREATH MRN: [id number] Date of Birth: 07-10-1950 Today's Date: 05/13/2024 HPI/PMH: HPI: 73 yo male presenting to ED after falling out of bed and ICD firing. Admitted with AKI superimposed on CKD and A-fib with RVR, initially scheduled for an OP ablation 12/1 (cardiology following). CT Chest shows patchy dependent bilateral lower lobe consolidation, favor atelectasis over airspace disease. CXR 11/28 shows new mild RUL opacity, suspicious for pneumonia. Most recent SLP visit July 2025 with functional-appearing oropharyngeal swallow clinically and recommendations to f/u for an esophagram. FEES 06/04/23 also WFL. PMH: COPD, paroxysmal A-fib s/p pacemaker on Xarelto , HF reduced EF 35-40%, CKD 3B, C5-C7 fusion surgery with hardware placement, HLD, dementia, T2DM Clinical Impression: Pt exhibits functional oropharyngeal swallowing. Trace, transient penetration was observed with thin liquids (PAS 2) but no further penetration/aspiration occurred even when challenged with large volumes. Residue along the BOT, valleculae, and pyriform sinues is overall trace. Coughing was observed on multiple occasions without  airway invasion or a significant collection of pharyngeal residue. Retention was not noted during the esophageal sweep when given the 13 mm barium tablet (difficult to fully visualize due to body habitus) but pt immediately endorsed globus sensation and heartburn. Could consider more dedicated esophageal assessment (esophagram  ordered per attending MD and GI). Will f/u at least briefly for education but do not anticipate post-acute SLP needs. Factors that may increase risk of adverse event in presence of aspiration [name] & [name] 2021): Factors that may increase risk of adverse event in presence of aspiration [name] & [name] 2021): Poor general health and/or compromised immunity; Limited mobility Recommendations/Plan: Swallowing Evaluation Recommendations Swallowing Evaluation Recommendations Recommendations: PO diet PO Diet Recommendation: Regular; Thin liquids (Level 0) Liquid Administration via: Cup; Straw Medication Administration: Whole meds with liquid Supervision: Patient able to self-feed; Intermittent supervision/cueing for swallowing strategies Swallowing strategies  : Minimize environmental distractions; Slow rate; Small bites/sips Postural changes: Position pt fully upright for meals; Stay upright 30-60 min after meals Oral care recommendations: Oral care BID (2x/day) Recommended consults: Consider esophageal assessment Treatment Plan Treatment Plan Treatment recommendations: Therapy as outlined in treatment plan below Follow-up recommendations: No SLP follow up Functional status assessment: Patient has had a recent decline in their functional status and demonstrates the ability to make significant improvements in function in a reasonable and predictable amount of time. Treatment frequency: Min 2x/week Treatment duration: 1 week Interventions: Aspiration precaution training; Patient/family education; Trials of upgraded texture/liquids; Diet toleration management by SLP Recommendations  Recommendations for follow up therapy are one component of a multi-disciplinary discharge planning process, led by the attending physician.  Recommendations may be updated based on patient status, additional functional criteria and insurance authorization. Assessment: Orofacial Exam: Orofacial Exam Oral Cavity: Oral Hygiene: WFL Oral Cavity - Dentition: Missing dentition Orofacial Anatomy: WFL Oral Motor/Sensory Function: WFL Anatomy: Anatomy: Suspected cervical osteophytes Boluses Administered: Boluses Administered Boluses Administered: Thin liquids (Level 0); Mildly thick liquids (Level 2, nectar thick); Moderately thick liquids (Level 3, honey thick); Puree; Solid  Oral Impairment Domain: Oral Impairment Domain Lip Closure: No labial escape Tongue control during bolus hold: Cohesive bolus between tongue to palatal seal Bolus preparation/mastication: Timely and efficient chewing and mashing Bolus transport/lingual motion: Brisk tongue motion Oral residue: Complete oral clearance Location of oral residue : N/A Initiation of pharyngeal swallow : Valleculae  Pharyngeal Impairment Domain: Pharyngeal Impairment Domain Soft palate elevation: No bolus between soft palate (SP)/pharyngeal wall (PW) Laryngeal elevation: Complete superior movement of thyroid cartilage with complete approximation of arytenoids to epiglottic petiole Anterior hyoid excursion: Complete anterior movement Epiglottic movement: Complete inversion Laryngeal vestibule closure: Complete, no air/contrast in laryngeal vestibule Pharyngeal stripping wave : Present - complete Pharyngeal contraction (A/P view only): N/A Pharyngoesophageal segment opening: Complete distension and complete duration, no obstruction of flow Tongue base retraction: Trace column of contrast or air between tongue base and PPW Pharyngeal residue: Trace residue within or on pharyngeal structures Location of pharyngeal residue: Tongue base; Valleculae; Pyriform sinuses  Esophageal  Impairment Domain: Esophageal Impairment Domain Esophageal clearance upright position: Complete clearance, esophageal coating Pill: Pill Consistency administered: Thin liquids (Level 0) Thin liquids (Level 0): [facility] Penetration/Aspiration Scale Score: Penetration/Aspiration Scale Score 1.  Material does not enter airway: Mildly thick liquids (Level 2, nectar thick); Moderately thick liquids (Level 3, honey thick); Puree; Solid; Pill 2.  Material enters airway, remains ABOVE vocal cords then ejected out: Thin liquids (Level 0) Compensatory Strategies: Compensatory Strategies Compensatory strategies: No   General Information: Caregiver present: Yes (RN)  Diet Prior to this Study: Regular; Thin liquids (Level 0)   Temperature : Normal   Respiratory Status: Increased WOB   Supplemental O2: Nasal cannula   History of Recent Intubation: No  Behavior/Cognition: Alert; Cooperative Self-Feeding Abilities: Able to self-feed Baseline vocal  quality/speech: Normal Volitional Cough: Able to elicit Volitional Swallow: Able to elicit Exam Limitations: No limitations Goal Planning: Prognosis for improved oropharyngeal function: Good Barriers to Reach Goals: Time post onset No data recorded Patient/Family Stated Goal: none stated Consulted and agree with results and recommendations: Patient; Nurse; Physician Pain: Pain Assessment Pain Assessment: No/denies pain End of Session: Start Time:SLP Start Time (ACUTE ONLY): 1309 Stop Time: SLP Stop Time (ACUTE ONLY): 1329 Time Calculation:SLP Time Calculation (min) (ACUTE ONLY): 20 min Charges: SLP Evaluations $ SLP Speech Visit: 1 Visit SLP Evaluations $MBS Swallow: 1 Procedure SLP visit diagnosis: SLP Visit Diagnosis: Dysphagia, unspecified (R13.10) Past Medical History: Past Medical History: Diagnosis Date  Asthma   Atrial fibrillation (HCC)   Bipolar affective (HCC)   Cataract   CHF (congestive heart failure) (HCC)   Conversion disorder   COPD (chronic obstructive pulmonary disease) (HCC)    Cornea disorder   Diabetes mellitus   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  High cholesterol   History of methicillin resistant staphylococcus aureus (MRSA)   Hypertension   Reflux   Renal disorder   Sleep apnea  Past Surgical History: Past Surgical History: Procedure Laterality Date  CARDIOVERSION N/A 08/26/2023  Procedure: CARDIOVERSION;  Surgeon: Barbaraann Darryle Ned, MD;  Location: [facility] INVASIVE CV LAB;  Service: Cardiovascular;  Laterality: N/A;  CARDIOVERSION N/A 02/26/2024  Procedure: CARDIOVERSION;  Surgeon: Lonni Slain, MD;  Location: [facility] INVASIVE CV LAB;  Service: Cardiovascular;  Laterality: N/A;  CARDIOVERSION N/A 03/04/2024  Procedure: CARDIOVERSION;  Surgeon: Francyne Headland, MD;  Location: [facility] INVASIVE CV LAB;  Service: Cardiovascular;  Laterality: N/A;  CATARACT EXTRACTION    CERVICAL FUSION    CHOLECYSTECTOMY    ICD IMPLANT N/A 03/01/2024  Procedure: ICD IMPLANT;  Surgeon: Kennyth Chew, MD;  Location: [facility] INVASIVE CV LAB;  Service: Cardiovascular;  Laterality: N/A;  KIDNEY STONE SURGERY    KNEE ARTHROSCOPY    TRANSESOPHAGEAL ECHOCARDIOGRAM (CATH LAB) N/A 03/04/2024  Procedure: TRANSESOPHAGEAL ECHOCARDIOGRAM;  Surgeon: Francyne Headland, MD;  Location: [facility] INVASIVE CV LAB;  Service: Cardiovascular;  Laterality: N/A; Damien Blumenthal, M.A., CCC-SLP Speech Language Pathology, Acute Rehabilitation Services Secure Chat preferred [phone] 05/13/2024, 2:38 PM  DG CHEST PORT 1 VIEW Result Date: 05/13/2024 CLINICAL DATA:  Dyspnea. EXAM: PORTABLE CHEST 1 VIEW COMPARISON:  05/10/2024 FINDINGS: Stable cardiomegaly. AICD again seen. Low lung volumes again noted. Stable pulmonary vascular congestion. New mild airspace opacity is seen in the right upper lobe, suspicious for pneumonia. IMPRESSION: New mild right upper lobe airspace opacity, suspicious for pneumonia. Electronically Signed   By: Norleen DELENA Kil M.D.   On: 05/13/2024 10:05         Scheduled Meds:  atorvastatin   80 mg Oral QHS    budesonide   2 mL Nebulization BID   budesonide -formoterol   2 puff Inhalation BID   clopidogrel   75 mg Oral Daily   digoxin   0.125 mg Oral Daily   docusate sodium   100 mg Oral BID   donepezil   10 mg Oral QHS   feeding supplement  1 Container Oral BID BM   guaiFENesin   1,200 mg Oral BID   insulin  aspart  0-5 Units Subcutaneous QHS   insulin  aspart  0-6 Units Subcutaneous TID WC   insulin  glargine-yfgn  40 Units Subcutaneous q morning   iron  polysaccharides  150 mg Oral BID   isosorbide  mononitrate  30 mg Oral Daily   memantine   10 mg Oral BID   metoprolol  succinate  100 mg Oral BID   mirabegron  ER  50 mg Oral Daily   montelukast   10 mg Oral QHS   oxybutynin   10 mg Oral QHS   pantoprazole  (PROTONIX ) IV  40 mg Intravenous Q12H   sertraline   100 mg Oral Daily   sodium chloride  flush  3 mL Intravenous Q12H   sodium chloride  flush  3 mL Intravenous Q12H   Continuous Infusions:  amiodarone  30 mg/hr (05/15/24 9366)   ampicillin -sulbactam (UNASYN ) IV 3 g (05/15/24 0634)   heparin  1,750 Units/hr (05/15/24 0321)   vancomycin  Stopped (05/14/24 2144)     LOS: 6 days    Time spent: 35 minutes    Lorin Hauck A Kevonta Phariss, MD [facility]   If 7PM-7AM, please contact night-coverage [email or web address]  05/15/2024, 7:43 AM

## 2024-05-16 ENCOUNTER — Inpatient Hospital Stay (HOSPITAL_COMMUNITY)

## 2024-05-16 ENCOUNTER — Ambulatory Visit (HOSPITAL_COMMUNITY)
Admission: RE | Admit: 2024-05-16 | Source: Home / Self Care | Admitting: Student in an Organized Health Care Education/Training Program

## 2024-05-16 ENCOUNTER — Encounter (HOSPITAL_COMMUNITY): Admission: EM | Disposition: A | Payer: Self-pay | Source: Home / Self Care | Attending: Internal Medicine

## 2024-05-16 DIAGNOSIS — I4819 Other persistent atrial fibrillation: Secondary | ICD-10-CM | POA: Diagnosis not present

## 2024-05-16 LAB — CULTURE, BLOOD (ROUTINE X 2)
Culture: NO GROWTH
Culture: NO GROWTH
Special Requests: ADEQUATE

## 2024-05-16 LAB — CBC
HCT: 27.6 % — ABNORMAL LOW (ref 39.0–52.0)
Hemoglobin: 8 g/dL — ABNORMAL LOW (ref 13.0–17.0)
MCH: 21.6 pg — ABNORMAL LOW (ref 26.0–34.0)
MCHC: 29 g/dL — ABNORMAL LOW (ref 30.0–36.0)
MCV: 74.4 fL — ABNORMAL LOW (ref 80.0–100.0)
Platelets: 344 K/uL (ref 150–400)
RBC: 3.71 MIL/uL — ABNORMAL LOW (ref 4.22–5.81)
RDW: 18 % — ABNORMAL HIGH (ref 11.5–15.5)
WBC: 9.6 K/uL (ref 4.0–10.5)
nRBC: 0.2 % (ref 0.0–0.2)

## 2024-05-16 LAB — GLUCOSE, CAPILLARY
Glucose-Capillary: 246 mg/dL — ABNORMAL HIGH (ref 70–99)
Glucose-Capillary: 163 mg/dL — ABNORMAL HIGH (ref 70–99)
Glucose-Capillary: 167 mg/dL — ABNORMAL HIGH (ref 70–99)
Glucose-Capillary: 241 mg/dL — ABNORMAL HIGH (ref 70–99)
Glucose-Capillary: 207 mg/dL — ABNORMAL HIGH (ref 70–99)

## 2024-05-16 LAB — BASIC METABOLIC PANEL WITH GFR
Anion gap: 10 (ref 5–15)
BUN: 15 mg/dL (ref 8–23)
CO2: 23 mmol/L (ref 22–32)
Calcium: 7.9 mg/dL — ABNORMAL LOW (ref 8.9–10.3)
Chloride: 104 mmol/L (ref 98–111)
Creatinine, Ser: 1.07 mg/dL (ref 0.61–1.24)
GFR, Estimated: >60 mL/min (ref >60)
Glucose, Bld: 205 mg/dL — ABNORMAL HIGH (ref 70–99)
Potassium: 3.6 mmol/L (ref 3.5–5.1)
Sodium: 137 mmol/L (ref 135–145)

## 2024-05-16 LAB — HEPARIN LEVEL (UNFRACTIONATED): Heparin Unfractionated: 0.47 [IU]/mL (ref 0.30–0.70)

## 2024-05-16 SURGERY — ATRIAL FIBRILLATION ABLATION
Anesthesia: General

## 2024-05-16 MED ORDER — INSULIN GLARGINE-YFGN 100 UNIT/ML ~~LOC~~ SOLN
40.0000 [IU] | Freq: Every morning | SUBCUTANEOUS | Status: DC
  Administered 2024-05-17 – 2024-05-21 (×4): 40 [IU] via SUBCUTANEOUS
  Filled 2024-05-16 (×6): qty 0.4

## 2024-05-16 NOTE — Progress Notes (Signed)
 Occupational Therapy Treatment Patient Details Name: Mike Hunter MRN: [id number] DOB: 1950/07/10 Today's Date: 05/16/2024   History of present illness 73 y/o M adm 05/09/24 after fall out of bed with Lt chest pain and ICD shock. Pt with AKI on CKD. Plan for ablation possibly 12/1. PMHx:  Afib, bipolar d/o, CHF, COPD, BPH, T2DM, HTN, reflux, CAD, ICD, cervical fusion   OT comments  Pt making slow progress with adls. Pt mobilizing better in regards to out of bed and stand pivot transfers to [facility] with CG assist. Pt becomes very SOB with all activity.  Talked to pt about pursed lip breathing techniques and energy conservation techniques for home. Talked to nursing about switching pt from purewick to urinal and using BSC for toileting needs instead of bed pan as pt is transferring with CG assist. Pt would like to d/c home instead of any kind of subacute rehab. In order to achieve this it would help to have pt up for all 3 meals and using 3:1 over bed pan.  Will continue to see with focus on adls. If d/c home, rec HHOT and HHAid to start.      If plan is discharge home, recommend the following:  A little help with walking and/or transfers;A little help with bathing/dressing/bathroom;Assistance with cooking/housework;Assist for transportation;Help with stairs or ramp for entrance   Equipment Recommendations  None recommended by OT    Recommendations for Other Services      Precautions / Restrictions Precautions Precautions: Fall Recall of Precautions/Restrictions: Intact Precaution/Restrictions Comments: watch HR Restrictions Weight Bearing Restrictions Per Provider Order: No       Mobility Bed Mobility Overal bed mobility: Needs Assistance Bed Mobility: Supine to Sit     Supine to sit: HOB elevated, Contact guard, Used rails     General bed mobility comments: Pt took incr time but was able to come to EOB without physical assist.    Transfers Overall transfer level: Needs  assistance Equipment used: Rolling walker (2 wheels) Transfers: Sit to/from Stand Sit to Stand: Contact guard assist Stand pivot transfers: Contact guard assist   Step pivot transfers: Contact guard assist     General transfer comment: Pt required assist with lines and cues for hand placement but was steady on feet this am. Pt gets very SOB with all activity and was instructed on pursed lip breathing.     Balance Overall balance assessment: History of Falls, Needs assistance Sitting-balance support: Feet supported, No upper extremity supported Sitting balance-Leahy Scale: Good     Standing balance support: During functional activity, Reliant on assistive device for balance, Bilateral upper extremity supported Standing balance-Leahy Scale: Poor Standing balance comment: RW in standing                           ADL either performed or assessed with clinical judgement   ADL Overall ADL's : Needs assistance/impaired Eating/Feeding: Set up;Sitting                   Lower Body Dressing: Minimal assistance;Sit to/from stand Lower Body Dressing Details (indicate cue type and reason): Pt able to sit in figure 4 this am to donn socks and shoes. Pt becomes very SOB with LE dressing activity but did not need as much physical assist; just rest breaks. Toilet Transfer: Geophysicist/field Seismologist Details (indicate cue type and reason): Pt pivoted to BSC. Has been using bedpan. Spoke with pt and nursing about using  BSC and switching from purewick to urinal. Toileting- Clothing Manipulation and Hygiene: Minimal assistance;Sit to/from stand Toileting - Clothing Manipulation Details (indicate cue type and reason): Pt stood to wash bottom in standing before sitting in recliner.     Functional mobility during ADLs: Contact guard assist;Rolling walker (2 wheels) General ADL Comments: Pt completing LE adls with more independence this am. Knee pain is  resolving.    Extremity/Trunk Assessment              Vision   Vision Assessment?: No apparent visual deficits   Perception     Praxis Praxis Praxis: Not tested   Communication Communication Communication: Impaired Factors Affecting Communication: Hearing impaired   Cognition Arousal: Alert Behavior During Therapy: WFL for tasks assessed/performed Cognition: History of cognitive impairments             OT - Cognition Comments: pt at baseline with STM deficits                 Following commands: Intact        Cueing   Cueing Techniques: Verbal cues  Exercises      Shoulder Instructions       General Comments Pt making slow improvements and does not want to go to any kind of rehab.  Need to get pt mobilizing better before able to go home.    Pertinent Vitals/ Pain       Pain Assessment Pain Assessment: Faces Faces Pain Scale: Hurts a little bit Pain Location: chest to touch possibly from coughing Pain Descriptors / Indicators: Aching, Sore Pain Intervention(s): Monitored during session, Repositioned  Home Living                                          Prior Functioning/Environment              Frequency  Min 2X/week        Progress Toward Goals  OT Goals(current goals can now be found in the care plan section)  Progress towards OT goals: Progressing toward goals  Acute Rehab OT Goals Patient Stated Goal: to go home in a few days OT Goal Formulation: With patient Time For Goal Achievement: 05/24/24 Potential to Achieve Goals: Good ADL Goals Pt Will Perform Grooming: with supervision;standing Pt Will Perform Lower Body Bathing: with supervision;sit to/from stand Pt Will Perform Lower Body Dressing: with supervision;sit to/from stand Pt Will Perform Tub/Shower Transfer: Shower transfer;with supervision;grab bars;shower seat;rolling walker;ambulating Additional ADL Goal #1: Pt will walk to bathroom w walker and  complete all toileting with supervision  Plan      Co-evaluation                 AM-PAC OT 6 Clicks Daily Activity     Outcome Measure   Help from another person eating meals?: A Little Help from another person taking care of personal grooming?: A Little Help from another person toileting, which includes using toliet, bedpan, or urinal?: A Little Help from another person bathing (including washing, rinsing, drying)?: A Lot Help from another person to put on and taking off regular upper body clothing?: A Little Help from another person to put on and taking off regular lower body clothing?: A Little 6 Click Score: 17    End of Session Equipment Utilized During Treatment: Rolling walker (2 wheels);Oxygen  OT Visit Diagnosis: Unsteadiness on feet (R26.81);Other abnormalities  of gait and mobility (R26.89);Repeated falls (R29.6);Pain Pain - part of body:  (chest to touch (MD in and things it is from coughing))   Activity Tolerance Patient limited by fatigue;No increased pain   Patient Left in chair;with call bell/phone within reach;with chair alarm set   Nurse Communication Mobility status        Time: 9199-9169 OT Time Calculation (min): 30 min  Charges: OT General Charges $OT Visit: 1 Visit OT Treatments $Self Care/Home Management : 23-37 mins    Joshua Silvano Dragon 05/16/2024, 8:43 AM

## 2024-05-16 NOTE — Progress Notes (Addendum)
 PROGRESS NOTE    Mike Hunter  FMW:[id number] DOB: 1951-02-16 DOA: 05/09/2024 PCP: Nikki Hansel Atlas, MD   Brief Narrative: 73 year old with past medical history significant for COPD, paroxysmal A-fib status post pacemaker on Xarelto , heart failure reduced ejection fraction 35 to 40%, CKD 3B, C5-C7 fusion surgery with hardware placement, chronic anemia, hyperlipidemia, urinary incontinence, anemia of chronic disease, bipolar disorder, dementia, obstructive sleep apnea on CPAP, morbid obesity, insulin -dependent diabetes type 2 presented to the ED complaining of falling out of his bed and ICD firing.  Chest x-ray showed bibasilar atelectasis.  Cardiomegaly without overt heart failure.  CT chest abdomen and pelvis no acute intrathoracic and intra-abdominal pelvic pathology.  Patchy dependent bilateral lower lobe consolidation, favor atelectasis over airspace disease.  CT cervical spine no acute abnormality.  Stable appearing anterior fusion hardware from C5-C7.  CT head no acute intracranial abnormality. Seen by cardiology no stress test needed.  He is a scheduled for cardiac ablation 12/1 as an outpatient.  During hospitalization patient develops A-fib with RVR, he was started on IV amiodarone .He develops  fever, cough, Started on IV Unasyn . Concern for aspiration, concern for esophageal dysphagia. Plan to consult GI>    Assessment & Plan:   Principal Problem:   Acute kidney injury superimposed on chronic kidney disease Active Problems:   Acute kidney injury superimposed on stage 3b chronic kidney disease (HCC)   Fall at home, initial encounter   HFrEF (heart failure with reduced ejection fraction) (HCC)   Insulin  dependent type 2 diabetes mellitus (HCC)   Hyperlipidemia   History of COPD   History of dementia   History of fusion of cervical spine   Chronic anemia   History of bipolar disorder   Obstructive sleep apnea   Atrial fibrillation with RVR (HCC)   Aspiration pneumonia (HCC)    Dysphagia   Globus sensation   Anticoagulated   Antiplatelet or antithrombotic long-term use   Persistent atrial fibrillation (HCC)   ICD (implantable cardioverter-defibrillator) in place   Chronic systolic heart failure (HCC)  1-AKI on superimposed CKD stage IIIb: - CT abdomen and pelvis no acute intrathoracic abdominal or pelvic finding. - Holding torsemide , Jardiance and losartan . - Received IV fluids. -Cr base line 1.4--1.6 Renal function back to baseline.   Aspiration PNA.  Had  fever, cough, leukocytosis . CT showed atelectasis vs infiltrates bases.  Report dysphagia.  Speech consulted. Concern for esophageal dysphagia.  Continue with  IV Protonix .  Continue with IV Unasyn , discontinue Vancomycin .  Blood culture: no growth to date.  . Lactic acid normalized.  Respiratory panel negative. Covid negative 11/28: report aspiration event in the morning after drinking water.  -11/18 chest x ray. Shows New mild right upper lobe airspace opacity, suspicious for pneumonia. -Continue with antibiotics.  -11/30: report aspiration this am, per nurse he had some cough, was very anxious.  -Esophagogram: Barium tablet got stuck near the gastroesophageal junction. Mild tapering in this area. Difficult to exclude an esophageal stricture.  GI will follow up on patient.   Paroxysmal A-fib status post pacemaker placement On amiodarone  and Toprol , Xarelto  Initially scheduled for cardiac ablation 12/1 Developed A-fib with RVR and was started on IV amiodarone  Cardiology consulted.  Continue with amiodarone  gtt.  Started on  digoxin .   Dysphagia;  Evaluated by speech, think more esophageal dysphagia.  Esophagogram ordered.  IV protonix .  Recurrent aspiration event.  GI consulted. They will follow up on him today.   Mechanical fall at home/physical deconditioning -PT OT consulted  Chest pain;  Sharp in quality EKG ordered.  Troponin negative.  Cardiology following. Associated with a  ib, and pr PNA On IV amiodarone  for A fib  Heart failure reduced ejection fraction, POA, chronic NYHA III, ejection fraction 35 to 40% - Continue Toprol  and Imdur  Holding torsemide  Jardiance and losartan  and spironolactone  due to AKI -Received IV lasix  11/27 11/28, 12/30. -negative 5 L PRN lasix .   COPD Report chest tightness.  Continue with nebulizer.  Received one dose of IV solumedrol 11/30.   Insulin -dependent diabetes type 2: - On Tresiba   History of dementia: - Continue memantine  and donezepil  Obstructive sleep apnea on CPAP - Continue CPAP at bedtime  Hyperlipidemia: - Continue atorvastatin   History of bipolar disorder/depression - Continue Zoloft   History of chronic anemia - Monitor hemoglobin  Hypokalemia; replete IV   Estimated body mass index is 39.83 kg/m as calculated from the following:   Height as of this encounter: 5' 10 (1.778 m).   Weight as of this encounter: 125.9 kg.   DVT prophylaxis: Xarelto  Code Status: Full code Family Communication: Care discussed with patient, wife over phone 11/29. Try to contact wife today 12/01  Disposition Plan:  Status is: Inpatient Remains inpatient appropriate because: Management of A-fib RVR, fever leukocytosis    Consultants:  Urology  Procedures:    Antimicrobials:    Subjective: He is alert, sitting recliner. Still having wheezing and feeling fair.  He is ready for esophagogram.   Objective: Vitals:   05/16/24 0402 05/16/24 0720 05/16/24 0848 05/16/24 0854  BP: 104/82 117/79    Pulse: 74 73    Resp: 14 17    Temp: 97.6 F (36.4 C) 98 F (36.7 C)    TempSrc: Oral Oral    SpO2: 99% 98% 99% 98%  Weight:      Height:        Intake/Output Summary (Last 24 hours) at 05/16/2024 1321 Last data filed at 05/16/2024 9495 Gross per 24 hour  Intake 2357.34 ml  Output 2300 ml  Net 57.34 ml   Filed Weights   05/09/24 2300 05/13/24 1500  Weight: 125.9 kg 125.9 kg     Examination:  General exam: NAD Respiratory system: No wheezing when he breath through nose Cardiovascular system: S 1, S 2 RRR Gastrointestinal system: BS present, soft, nt Central nervous system: Aler,t follows command.  Extremities: no edema   Data Reviewed: I have personally reviewed following labs and imaging studies  CBC: Recent Labs  Lab 05/10/24 2324 05/12/24 0230 05/13/24 1015 05/14/24 0221 05/15/24 0200 05/16/24 0141  WBC 16.2* 14.5* 16.0* 12.4* 12.3* 9.6  NEUTROABS 14.0*  --   --   --   --   --   HGB 9.9* 8.7* 8.9* 7.9* 8.2* 8.0*  HCT 34.3* 29.7* 31.1* 27.2* 27.9* 27.6*  MCV 74.7* 75.0* 74.9* 74.5* 74.4* 74.4*  PLT 358 275 344 328 335 344   Basic Metabolic Panel: Recent Labs  Lab 05/10/24 2324 05/12/24 0230 05/13/24 1015 05/14/24 0221 05/15/24 0200 05/16/24 0141  NA 140 136 138 138 134* 137  K 3.9 3.8 3.9 3.2* 3.3* 3.6  CL 105 105 106 107 103 104  CO2 25 23 21* 21* 22 23  GLUCOSE 208* 171* 271* 167* 164* 205*  BUN 19 16 18 16 14 15   CREATININE 1.57* 1.32* <0.30* 1.25* 1.05 1.07  CALCIUM  8.6* 8.3* 8.3* 8.0* 8.1* 7.9*  MG 2.5*  --  2.1  --   --   --  GFR: Estimated Creatinine Clearance: 79.5 mL/min (by C-G formula based on SCr of 1.07 mg/dL). Liver Function Tests: Recent Labs  Lab 05/09/24 1832 05/10/24 0240  AST 25 19  ALT 31 28  ALKPHOS 85 81  BILITOT 0.8 0.7  PROT 6.4* 6.0*  ALBUMIN  3.0* 2.8*   No results for input(s): LIPASE, AMYLASE in the last 168 hours. No results for input(s): AMMONIA in the last 168 hours. Coagulation Profile: No results for input(s): INR, PROTIME in the last 168 hours. Cardiac Enzymes: No results for input(s): CKTOTAL, CKMB, CKMBINDEX, TROPONINI in the last 168 hours. BNP (last 3 results) Recent Labs    02/25/24 1520 04/05/24 1343 04/06/24 0554  PROBNP 2,137.0* 2,191.0* 2,599.0*   HbA1C: No results for input(s): HGBA1C in the last 72 hours. CBG: Recent Labs  Lab  05/15/24 1126 05/15/24 1611 05/15/24 2117 05/16/24 0625 05/16/24 1114  GLUCAP 273* 383* 246* 163* 167*   Lipid Profile: No results for input(s): CHOL, HDL, LDLCALC, TRIG, CHOLHDL, LDLDIRECT in the last 72 hours. Thyroid Function Tests: No results for input(s): TSH, T4TOTAL, FREET4, T3FREE, THYROIDAB in the last 72 hours.  Anemia Panel: No results for input(s): VITAMINB12, FOLATE, FERRITIN, TIBC, IRON , RETICCTPCT in the last 72 hours. Sepsis Labs: Recent Labs  Lab 05/11/24 0955 05/11/24 1322 05/11/24 1923 05/11/24 2140  LATICACIDVEN 1.6 2.5* 1.1 0.8    Recent Results (from the past 240 hours)  Urine Culture (for pregnant, neutropenic or urologic patients or patients with an indwelling urinary catheter)     Status: None   Collection Time: 05/11/24 10:34 AM   Specimen: Urine, Clean Catch  Result Value Ref Range Status   Specimen Description URINE, CLEAN CATCH  Final   Special Requests NONE  Final   Culture   Final    NO GROWTH Performed at [facility], [address], [place], [place] [address]    Report Status 05/13/2024 FINAL  Final  Culture, blood (Routine X 2) w Reflex to ID Panel     Status: None   Collection Time: 05/11/24 10:37 AM   Specimen: BLOOD  Result Value Ref Range Status   Specimen Description BLOOD RIGHT ANTECUBITAL  Final   Special Requests   Final    BOTTLES DRAWN AEROBIC ONLY Blood Culture results may not be optimal due to an inadequate volume of blood received in culture bottles   Culture   Final    NO GROWTH 5 DAYS Performed at [facility], [address] [place], [place] [address]    Report Status 05/16/2024 FINAL  Final  Culture, blood (Routine X 2) w Reflex to ID Panel     Status: None   Collection Time: 05/11/24 10:43 AM   Specimen: BLOOD  Result Value Ref Range Status   Specimen Description BLOOD RIGHT ANTECUBITAL  Final   Special Requests   Final    BOTTLES DRAWN AEROBIC AND ANAEROBIC Blood  Culture adequate volume   Culture   Final    NO GROWTH 5 DAYS Performed at [facility], [address], [place], [place] [address]    Report Status 05/16/2024 FINAL  Final  MRSA Next Gen by PCR, Nasal     Status: None   Collection Time: 05/11/24  6:47 PM   Specimen: Nasal Mucosa; Nasal Swab  Result Value Ref Range Status   MRSA by PCR Next Gen NOT DETECTED NOT DETECTED Final    Comment: (NOTE) The GeneXpert MRSA Assay (FDA approved for NASAL specimens only),  is one component of a comprehensive MRSA colonization surveillance program. It is not intended to diagnose MRSA infection nor to guide or monitor treatment for MRSA infections. Test performance is not FDA approved in patients less than 12 years old. Performed at [facility], [address], [place], [place] [address]   Respiratory (~20 pathogens) panel by PCR     Status: None   Collection Time: 05/12/24  7:26 AM   Specimen: Nasopharyngeal Swab; Respiratory  Result Value Ref Range Status   Adenovirus NOT DETECTED NOT DETECTED Final   Coronavirus 229E NOT DETECTED NOT DETECTED Final    Comment: (NOTE) The Coronavirus on the Respiratory Panel, DOES NOT test for the novel  Coronavirus (2019 nCoV)    Coronavirus HKU1 NOT DETECTED NOT DETECTED Final   Coronavirus NL63 NOT DETECTED NOT DETECTED Final   Coronavirus OC43 NOT DETECTED NOT DETECTED Final   Metapneumovirus NOT DETECTED NOT DETECTED Final   Rhinovirus / Enterovirus NOT DETECTED NOT DETECTED Final   Influenza A NOT DETECTED NOT DETECTED Final   Influenza B NOT DETECTED NOT DETECTED Final   Parainfluenza Virus 1 NOT DETECTED NOT DETECTED Final   Parainfluenza Virus 2 NOT DETECTED NOT DETECTED Final   Parainfluenza Virus 3 NOT DETECTED NOT DETECTED Final   Parainfluenza Virus 4 NOT DETECTED NOT DETECTED Final   Respiratory Syncytial Virus NOT DETECTED NOT DETECTED Final   Bordetella pertussis NOT DETECTED NOT DETECTED Final   Bordetella Parapertussis NOT  DETECTED NOT DETECTED Final   Chlamydophila pneumoniae NOT DETECTED NOT DETECTED Final   Mycoplasma pneumoniae NOT DETECTED NOT DETECTED Final    Comment: Performed at [facility], [address], [place], [place] [address]  SARS Coronavirus 2 by RT PCR (hospital order, performed in [facility] hospital lab) *cepheid single result test* Anterior Nasal Swab     Status: None   Collection Time: 05/13/24  3:11 PM   Specimen: Anterior Nasal Swab  Result Value Ref Range Status   SARS Coronavirus 2 by RT PCR NEGATIVE NEGATIVE Final    Comment: Performed at [facility], [address], [place], [place] [address]         Radiology Studies: DG ESOPHAGUS W SINGLE CM (SOL OR THIN BA) Result Date: 05/16/2024 CLINICAL DATA:  73 year old male. Endorses dysphagia. Request is for esophagram for further evaluation. EXAM: ESOPHAGUS/BARIUM SWALLOW/TABLET STUDY TECHNIQUE: Single contrast examination was performed using thin liquid barium. This exam was performed by Delon Beagle NP, and was supervised and interpreted by Dr. Juliene Balder. FLUOROSCOPY: Radiation Exposure Index (as provided by the fluoroscopic device): 21.4 mGy Kerma COMPARISON:  None Available. FINDINGS: Swallowing: Appears normal. No vestibular penetration or aspiration seen. Pharynx: Unremarkable. Esophagus: Normal appearance of the esophagus. Mild tapering near the GE junction. Esophageal motility: Within normal limits. Hiatal Hernia: Not assessed Gastroesophageal reflux: None visualized. Ingested 13mm barium tablet: Got stuck at the GE junction. Did not pass with additional thin liquids. Other: None. IMPRESSION: 1. Barium tablet got stuck near the gastroesophageal junction. Mild tapering in this area. Difficult to exclude an esophageal stricture. Consider further evaluation with endoscopy. Electronically Signed   By: Juliene Balder M.D.   On: 05/16/2024 11:46         Scheduled Meds:  atorvastatin   80 mg Oral QHS   budesonide   2  mL Nebulization BID   budesonide -formoterol   2 puff Inhalation BID   digoxin   0.125 mg Oral Daily   docusate sodium   100 mg Oral BID  donepezil   10 mg Oral QHS   doxepin   10-20 mg Oral QHS   feeding supplement  1 Container Oral BID BM   guaiFENesin   1,200 mg Oral BID   insulin  aspart  0-5 Units Subcutaneous QHS   insulin  aspart  0-6 Units Subcutaneous TID WC   insulin  glargine-yfgn  40 Units Subcutaneous q morning   iron  polysaccharides  150 mg Oral BID   isosorbide  mononitrate  30 mg Oral Daily   memantine   10 mg Oral BID   metoprolol  succinate  100 mg Oral BID   mirabegron  ER  50 mg Oral Daily   montelukast   10 mg Oral QHS   oxybutynin   10 mg Oral QHS   pantoprazole  (PROTONIX ) IV  40 mg Intravenous Q12H   sertraline   100 mg Oral Daily   sodium chloride  flush  3 mL Intravenous Q12H   sodium chloride  flush  3 mL Intravenous Q12H   Continuous Infusions:  amiodarone  30 mg/hr (05/16/24 0534)   ampicillin -sulbactam (UNASYN ) IV 200 mL/hr at 05/16/24 1255   heparin  1,950 Units/hr (05/16/24 0545)     LOS: 7 days    Time spent: 35 minutes    Laurrie Toppin A Bridgid Printz, MD [facility]   If 7PM-7AM, please contact night-coverage [email or web address]  05/16/2024, 1:21 PM

## 2024-05-16 NOTE — Progress Notes (Signed)
 PHARMACY - ANTICOAGULATION CONSULT NOTE  Pharmacy Consult for heparin  (transition from Xarelto ) Indication: atrial fibrillation  Allergies  Allergen Reactions   Buprenorphine Hcl Nausea And Vomiting   Hydromorphone  Other (See Comments)    Mental Status Changes/HALLUCINATIONS   Mirtazapine Other (See Comments)    Nightmares & altered mental status    Codeine Nausea And Vomiting   Morphine  And Codeine Nausea And Vomiting    Patient Measurements: Height: 5' 10 (177.8 cm) Weight: 125.9 kg (277 lb 9 oz) IBW/kg (Calculated) : 73 HEPARIN  DW (KG): 101.6  Vital Signs: Temp: 98 F (36.7 C) (12/01 0720) Temp Source: Oral (12/01 0720) BP: 117/79 (12/01 0720) Pulse Rate: 73 (12/01 0720)  Labs: Recent Labs    05/14/24 0221 05/14/24 1120 05/14/24 2058 05/15/24 0200 05/15/24 1610 05/16/24 0141  HGB 7.9*  --   --  8.2*  --  8.0*  HCT 27.2*  --   --  27.9*  --  27.6*  PLT 328  --   --  335  --  344  APTT 62* 44* 68*  --   --   --   HEPARINUNFRC 0.64 0.30 0.32 0.26* 0.31 0.47  CREATININE 1.25*  --   --  1.05  --  1.07  TROPONINIHS  --   --   --   --  7  --     Estimated Creatinine Clearance: 79.5 mL/min (by C-G formula based on SCr of 1.07 mg/dL).   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   05/08/2024   albuterol  (PROVENTIL   HFA;VENTOLIN  HFA) 108 (90 BASE) MCG/ACT inhaler Inhale 2 puffs into the lungs every 4 (four) hours as needed for wheezing or shortness of breath.   05/09/2024 Morning   amiodarone  (PACERONE ) 200 MG tablet Take 1 tablet (200 mg total) by mouth 2 (two) times daily. 60 tablet 3 05/09/2024 Morning   atorvastatin  (LIPITOR ) 80 MG tablet Take 80 mg by mouth at bedtime.   05/08/2024   azithromycin  (ZITHROMAX ) 250 MG tablet Take 250 mg by mouth every Monday, Wednesday, and Friday.   05/09/2024 Morning   budesonide  (PULMICORT ) 0.5 MG/2ML nebulizer solution Take 2 mLs by nebulization 2 (two) times daily.   05/09/2024 Morning   clopidogrel  (PLAVIX ) 75 MG tablet Take 75 mg by mouth daily.   05/09/2024 at  9:00 AM   dapagliflozin propanediol (FARXIGA) 10 MG TABS tablet Take 10 mg by mouth daily.   05/09/2024 Morning   donepezil  (ARICEPT ) 10 MG tablet Take 10 mg by mouth at bedtime.   05/08/2024   doxepin  (SINEQUAN ) 10 MG capsule Take 10-20 mg by mouth at bedtime.   05/08/2024   insulin  aspart (NOVOLOG  FLEXPEN) 100 UNIT/ML injection Inject 0-13 Units into the skin  See admin instructions. Inject 0-13 units into the skin three times a day with meals, PER SLIDING SCALE   05/09/2024 Morning   iron  polysaccharides (NIFEREX) 150 MG capsule Take 150 mg by mouth 2 (two) times daily.   05/09/2024 Morning   isosorbide  mononitrate (IMDUR ) 30 MG 24 hr tablet Take 30 mg by mouth daily.   05/09/2024 Morning   latanoprost  (XALATAN ) 0.005 % ophthalmic solution Place 1 drop into both eyes at bedtime.     05/08/2024   lidocaine  4 % Place 1 patch onto the skin daily. Apply to lower back.   Taking   losartan  (COZAAR ) 25 MG tablet Take 1 tablet (25 mg total) by mouth daily. 90 tablet 2 05/09/2024 Morning   memantine  (NAMENDA ) 10 MG tablet Take 10 mg by mouth 2 (two) times daily.   05/09/2024 Morning   metoprolol  succinate (TOPROL -XL) 50 MG 24 hr tablet Take 1 tablet (50 mg total) by mouth in the morning and at bedtime. Take with or  immediately following a meal. 180 tablet 3 05/09/2024 at  9:00 AM   mirabegron  ER (MYRBETRIQ ) 50 MG TB24 tablet Take 50 mg by mouth daily.   05/09/2024 Morning   montelukast  (SINGULAIR ) 10 MG tablet Take 10 mg by mouth at bedtime.   05/08/2024   Multiple Vitamin (MULTIVITAMIN WITH MINERALS) TABS tablet Take 1 tablet by mouth daily with breakfast.   05/09/2024 Morning   Multiple Vitamins-Minerals (PRESERVISION AREDS PO) Take 2 capsules by mouth in the morning.   05/09/2024 Morning   omeprazole (PRILOSEC) 40 MG capsule Take 1 capsule by mouth daily after supper.   05/08/2024   oxybutynin  (DITROPAN -XL) 10 MG 24 hr tablet Take 10 mg by mouth daily.   05/09/2024 Morning   OXYGEN Inhale 3 L into the lungs daily as needed (Shortness of breath, wheezing).   05/09/2024   sertraline  (ZOLOFT ) 100 MG tablet Take 100 mg by mouth daily.   05/09/2024 Morning   spironolactone  (ALDACTONE ) 25 MG tablet Take 25 mg by mouth daily.   05/09/2024 Morning   Teriparatide  560 MCG/2.24ML SOPN Inject 20 mcg into the skin at bedtime.   05/08/2024   torsemide  (DEMADEX ) 20 MG tablet Take 1 tablet (20 mg total) by mouth daily. (Patient taking differently: Take 20 mg by mouth 2 (two) times daily at 10 AM and 5 PM.)   05/09/2024 Morning   TRESIBA  FLEXTOUCH 100 UNIT/ML FlexTouch Pen Inject 40-46 Units into the skin See admin instructions. Inject 40 units into the skin in the morning and 46 units at bedtime   05/09/2024 Morning   WIXELA INHUB 250-50 MCG/ACT AEPB Inhale 2 puffs into the lungs in the morning.   05/09/2024 Morning   XARELTO  15 MG TABS tablet Take 15 mg by mouth daily with supper.   Taking   benzonatate  (TESSALON ) 200 MG capsule Take 1 capsule (200 mg total) by mouth 3 (three) times daily as needed for cough. (Patient not taking: Reported on 05/10/2024) 20 capsule 0 Not Taking   Scheduled:   atorvastatin   80 mg Oral QHS   budesonide   2 mL Nebulization BID   budesonide -formoterol   2 puff Inhalation BID   clopidogrel    75 mg Oral Daily   digoxin   0.125 mg Oral Daily   docusate sodium   100 mg Oral BID   donepezil   10 mg Oral QHS   doxepin   10-20 mg Oral QHS   feeding supplement  1 Container Oral BID BM   guaiFENesin   1,200 mg Oral BID  insulin  aspart  0-5 Units Subcutaneous QHS   insulin  aspart  0-6 Units Subcutaneous TID WC   insulin  glargine-yfgn  40 Units Subcutaneous q morning   iron  polysaccharides  150 mg Oral BID   isosorbide  mononitrate  30 mg Oral Daily   memantine   10 mg Oral BID   metoprolol  succinate  100 mg Oral BID   mirabegron  ER  50 mg Oral Daily   montelukast   10 mg Oral QHS   oxybutynin   10 mg Oral QHS   pantoprazole  (PROTONIX ) IV  40 mg Intravenous Q12H   sertraline   100 mg Oral Daily   sodium chloride  flush  3 mL Intravenous Q12H   sodium chloride  flush  3 mL Intravenous Q12H    Assessment: 73 YOM w/ PMH significant for AF on Xarelto  PTA [last known dose 11/27 1659] who needs heparin  therapy 2/2 need for EGD w/ esophageal dilation. No history noted for stroke, intracranial bleed, or GIB from review of chart. He had an ICD placed 03/01/2024  Heparin  level therapeutic at 0.47, will continue. CBC stable: Hgb 8.0, plt 344. No interruptions or signs/sx of bleeding noted.  Goal of Therapy:  Heparin  level 0.3-0.7 units/ml Monitor platelets by anticoagulation protocol: Yes   Plan:  Continue IV heparin  at 1950 units/h Daily heparin  level and CBC  Elma Fail, PharmD PGY1 Clinical Pharmacist [facility]  05/16/2024 9:12 AM

## 2024-05-16 NOTE — Progress Notes (Signed)
 Physical Therapy Treatment Patient Details Name: Mike Hunter MRN: [id number] DOB: 1951-06-03 Today's Date: 05/16/2024   History of Present Illness 73 y/o M adm 05/09/24 after fall out of bed with Lt chest pain and ICD shock. Pt with AKI on CKD. Plan for ablation possibly 12/1. PMHx:  Afib, bipolar d/o, CHF, COPD, BPH, T2DM, HTN, reflux, CAD, ICD, cervical fusion    PT Comments  Pt is progressing towards goals. Currently pt is CGA to supervision for sit to stand, transfers and gait. CGA for safety but no buckling or overt LOB noted with mobility. Pt requires supervision for safety cues and assist with lines. Due to pt current functional status, home set up and available assistance at home recommending skilled physical therapy services 3x/week in order to address strength, balance and functional mobility to decrease risk for falls, injury and re-hospitalization.       If plan is discharge home, recommend the following: A little help with walking and/or transfers;Assist for transportation;Help with stairs or ramp for entrance     Equipment Recommendations  None recommended by PT       Precautions / Restrictions Precautions Precautions: Fall Recall of Precautions/Restrictions: Intact Precaution/Restrictions Comments: watch HR Restrictions Weight Bearing Restrictions Per Provider Order: No     Mobility  Bed Mobility   General bed mobility comments: pt in recliner at beginning/end of session    Transfers Overall transfer level: Needs assistance Equipment used: Rolling walker (2 wheels) Transfers: Sit to/from Stand Sit to Stand: Contact guard assist           General transfer comment: Pt required assist with lines and cues for hand placement but was steady on feet this am.    Ambulation/Gait Ambulation/Gait assistance: Contact guard assist Gait Distance (Feet): 60 Feet Assistive device: Rolling walker (2 wheels) Gait Pattern/deviations: Step-through pattern, Decreased stride  length Gait velocity: decreased Gait velocity interpretation: <1.8 ft/sec, indicate of risk for recurrent falls   General Gait Details: pt with cues for proximity to RW and direction. SPO2 >95% on 2L O2 via [place]      Balance Overall balance assessment: History of Falls, Needs assistance Sitting-balance support: Feet supported, No upper extremity supported Sitting balance-Leahy Scale: Good     Standing balance support: During functional activity, Reliant on assistive device for balance, Bilateral upper extremity supported Standing balance-Leahy Scale: Fair Standing balance comment: RW in standing      Communication Communication Communication: No apparent difficulties  Cognition Arousal: Alert Behavior During Therapy: WFL for tasks assessed/performed   PT - Cognitive impairments: No apparent impairments       Following commands: Intact      Cueing Cueing Techniques: Verbal cues         Pertinent Vitals/Pain Pain Assessment Pain Assessment: No/denies pain Pain Intervention(s): Monitored during session     PT Goals (current goals can now be found in the care plan section) Acute Rehab PT Goals Patient Stated Goal: return home, get my ablation next week PT Goal Formulation: With patient Time For Goal Achievement: 05/24/24 Potential to Achieve Goals: Fair Progress towards PT goals: Progressing toward goals    Frequency    Min 2X/week      PT Plan  Continue with current POC        AM-PAC PT 6 Clicks Mobility   Outcome Measure  Help needed turning from your back to your side while in a flat bed without using bedrails?: A Little Help needed moving from lying on your back to  sitting on the side of a flat bed without using bedrails?: A Little Help needed moving to and from a bed to a chair (including a wheelchair)?: A Little Help needed standing up from a chair using your arms (e.g., wheelchair or bedside chair)?: A Little Help needed to walk in hospital  room?: A Little Help needed climbing 3-5 steps with a railing? : A Lot 6 Click Score: 17    End of Session Equipment Utilized During Treatment: Gait belt;Oxygen Activity Tolerance: Patient tolerated treatment well Patient left: with call bell/phone within reach;in chair;with chair alarm set Nurse Communication: Mobility status PT Visit Diagnosis: Other abnormalities of gait and mobility (R26.89)     Time: 8861-8798 PT Time Calculation (min) (ACUTE ONLY): 23 min  Charges:    $Therapeutic Activity: 23-37 mins PT General Charges $$ ACUTE PT VISIT: 1 Visit                     Dorothyann Maier, DPT, CLT  Acute Rehabilitation Services Office: [phone] (Secure chat preferred)    Dorothyann VEAR Maier 05/16/2024, 12:08 PM

## 2024-05-16 NOTE — Plan of Care (Signed)
  Problem: Education: Goal: Ability to describe self-care measures that may prevent or decrease complications (Diabetes Survival Skills Education) will improve Outcome: Progressing   Problem: Coping: Goal: Ability to adjust to condition or change in health will improve Outcome: Progressing

## 2024-05-16 NOTE — Progress Notes (Signed)
 Progress Note  Patient Name: Mike Hunter Date of Encounter: 05/16/2024 [facility] Cardiologist: Jerel Balding, MD   Interval Summary    Was unable to tolerate liquids last night, provoked coughing, nausea C/w chest wall pain  Vital Signs Vitals:   05/15/24 2145 05/15/24 2300 05/16/24 0356 05/16/24 0402  BP:  (!) 125/93 114/81 104/82  Pulse: 91 88 79 74  Resp: (!) 27 20 (!) 21 14  Temp:  98.2 F (36.8 C) 98.3 F (36.8 C) 97.6 F (36.4 C)  TempSrc:  Oral Oral Oral  SpO2: 98% 99% 100% 99%  Weight:      Height:        Intake/Output Summary (Last 24 hours) at 05/16/2024 0759 Last data filed at 05/16/2024 0504 Gross per 24 hour  Intake 2407.34 ml  Output 3150 ml  Net -742.66 ml      05/13/2024    3:00 PM 05/09/2024   11:00 PM 04/07/2024    5:31 AM  Last 3 Weights  Weight (lbs) 277 lb 9 oz 277 lb 9 oz 277 lb 9.6 oz  Weight (kg) 125.9 kg 125.9 kg 125.919 kg      Telemetry/ECG/cardiology data  03/04/24: TEE 1. Left ventricular ejection fraction, by estimation, is 35 to 40%. The  left ventricle has moderately decreased function. The left ventricle  demonstrates global hypokinesis. Left ventricular diastolic function could  not be evaluated.   2. Right ventricular systolic function is mildly reduced. The right  ventricular size is normal. The estimated right ventricular systolic  pressure is 35.0 mmHg.   3. Left atrial size was moderately dilated. No left atrial/left atrial  appendage thrombus was detected. The LAA emptying velocity was 23 cm/s.   4. Right atrial size was mildly dilated.   5. The mitral valve is normal in structure. Trivial mitral valve  regurgitation. No evidence of mitral stenosis.   6. Possibly a functionally bicuspid aortic valve. There is a calcified  raphe between the left and noncoronary cusps. The aortic valve is  abnormal. There is mild calcification of the aortic valve. There is mild  thickening of the aortic valve. Aortic   valve regurgitation is not visualized. Aortic valve  sclerosis/calcification is present, without any evidence of aortic  stenosis.   7. There is Moderate (Grade III) plaque involving the descending aorta.    C.MRI 02/29/24 IMPRESSION: 1. LVEF 35% in the setting of AF RVR. 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 In correlation with lab testing showing no acute infarction this admission, this is less likely a viable territory.   2.  RVEF 31% in the setting of AF RVR.   3.  Bilateral pleural effusions incidentally noted.     02/26/24: TTE 1. Left ventricular ejection fraction, by estimation, is 45 to 50%. The  left ventricle has mildly decreased function. The left ventricle  demonstrates global hypokinesis. The left ventricular internal cavity size  was mildly dilated. Left ventricular  diastolic parameters are indeterminate.   2. Right ventricular systolic function is normal. The right ventricular  size is normal. There is mildly elevated pulmonary artery systolic  pressure. The estimated right ventricular systolic pressure is 37.8 mmHg.   3. Left atrial size was mildly dilated.   4. The mitral valve is normal in structure. Mild mitral valve  regurgitation.   5. The aortic valve is calcified. Aortic valve regurgitation is not  visualized. Mild to moderate aortic valve stenosis. Vmax 2.8 m/s, MG 21  mmHg, AVA 1.6 cm^2, DI 0.45   6. The inferior vena cava is normal in size with greater than 50%  respiratory variability, suggesting right atrial pressure of 3 mmHg.      11/06/23: LHC/PCI DIAGNOSTIC FINDINGS:    1.  Borderline stenosis of the mid LAD.  RFR abnormal, 0.73.  2.  Otherwise nonobstructive CAD.  3.  Aortic stenosis with mean gradient 23 mmHg by pullback.   PCI:    Successful angioplasty and stenting of the LAD with 2 overlapping stents.   The area of borderline stenosis was focal, but there is atherosclerosis  throughout the LAD  which required long segment stenting.  A 2.75 x 48 mm  had a 3.5 x 38 mm Xience drug-eluting stent were used.  This was  aggressively postdilated with noncompliant balloons and IVUS was used for  stent optimization.      TELEMETRY AFib, mostly 80's, no brady, - Personally Reviewed   No new EKGs   Physical Exam  General: Chronically ill appearing, in no acute distress.  Neck: No JVD.  Cardiac: irreg-irreg Resp: not actively wheezing, deep breaths provoke coughing Ext: No edema.  Neuro: No gross focal deficits.  Psych: Normal affect.   Assessment & Plan  Mike Hunter is a 73 y.o. 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  Found to have likely aspiration pneumonia with fever and leukocytosis. Ongoing aspiration events.    Problem List:  AF/RVR AFL Known for him on Xarelto  out pt > heparin  here Hx of Sustained VT w/ICD in place None here HFrEF  CAD s/p LAD PCI  S/p PCI to LAD May 2025, on Plavix  CP felt to be MSK/reproducible >neg Trop, no acute EKG changes appreciated Is absolutely reproduced/worsened with palpation of his chest wall     Aspiration pneumonia Anemia Deferred to attending team  H/H trickling down > follow, deferred to IM Was planned for esophagram yesterday, canceled with c/o CP > today Reports wheezing with ambulation Deep breaths for exam provoke coughing fit      Plan: -Continue amiodarone  gtt for now (on PO outpt) -Continue metoprolol  XL 100mg  BID (increased from home dose of 50mg  BID) -Continue digoxin  0.125mg  daily which appears to have helped with rate control (new med for him)   -Transitioned to heparin  in lieu of Xarelto  for possible upcoming procedures.  Anemia >> deferred to attending BP  stable  -Will need antibiotic course for any infectious issues to be complete prior to any device upgrade. WBC back wo WNL today, no fever.  -Ultimately, likely to have AVN ablation and upgrade to CRT, hopefully prior to discharge, given his poor functional status and co-morbidities. Will need to be able to lay flat for procedure without significant risk of aspiration.     Signed, Charlies Macario Arthur, PA-C

## 2024-05-16 NOTE — Progress Notes (Signed)
 Mobility Specialist Progress Note:    05/16/24 1015  Mobility  Activity Pivoted/transferred from chair to bed  Level of Assistance Contact guard assist, steadying assist  Assistive Device Other (Comment) (HHA)  Distance Ambulated (ft) 3 ft  Range of Motion/Exercises Active  Activity Response Tolerated fair  Mobility Referral Yes  Mobility visit 1 Mobility  Mobility Specialist Start Time (ACUTE ONLY) 1015  Mobility Specialist Stop Time (ACUTE ONLY) 1025  Mobility Specialist Time Calculation (min) (ACUTE ONLY) 10 min   Received pt sitting in recliner needing to be transferred back to bed to go down for procedure. Pt c/o legs feeling weak and somewhat dizzy. Pt able to stand w/ light CGA and take steps to transfer w/ regular CGA. D/t anxiety, pt needs constant cueing to breathe and redirection to stay on task. Returned pt to bed w/ all needs met.   Venetia Keel Mobility Specialist Please Neurosurgeon or Rehab Office at [phone]

## 2024-05-17 DIAGNOSIS — K222 Esophageal obstruction: Secondary | ICD-10-CM

## 2024-05-17 DIAGNOSIS — R1319 Other dysphagia: Secondary | ICD-10-CM | POA: Diagnosis not present

## 2024-05-17 DIAGNOSIS — Z7901 Long term (current) use of anticoagulants: Secondary | ICD-10-CM | POA: Diagnosis not present

## 2024-05-17 DIAGNOSIS — I4819 Other persistent atrial fibrillation: Secondary | ICD-10-CM | POA: Diagnosis not present

## 2024-05-17 LAB — CBC
HCT: 30.2 % — ABNORMAL LOW (ref 39.0–52.0)
Hemoglobin: 8.6 g/dL — ABNORMAL LOW (ref 13.0–17.0)
MCH: 21.5 pg — ABNORMAL LOW (ref 26.0–34.0)
MCHC: 28.5 g/dL — ABNORMAL LOW (ref 30.0–36.0)
MCV: 75.5 fL — ABNORMAL LOW (ref 80.0–100.0)
Platelets: 367 K/uL (ref 150–400)
RBC: 4 MIL/uL — ABNORMAL LOW (ref 4.22–5.81)
RDW: 18 % — ABNORMAL HIGH (ref 11.5–15.5)
WBC: 12.6 K/uL — ABNORMAL HIGH (ref 4.0–10.5)
nRBC: 0.2 % (ref 0.0–0.2)

## 2024-05-17 LAB — GLUCOSE, CAPILLARY
Glucose-Capillary: 159 mg/dL — ABNORMAL HIGH (ref 70–99)
Glucose-Capillary: 184 mg/dL — ABNORMAL HIGH (ref 70–99)
Glucose-Capillary: 215 mg/dL — ABNORMAL HIGH (ref 70–99)
Glucose-Capillary: 214 mg/dL — ABNORMAL HIGH (ref 70–99)

## 2024-05-17 LAB — BASIC METABOLIC PANEL WITH GFR
Anion gap: 9 (ref 5–15)
BUN: 14 mg/dL (ref 8–23)
CO2: 22 mmol/L (ref 22–32)
Calcium: 8.3 mg/dL — ABNORMAL LOW (ref 8.9–10.3)
Chloride: 108 mmol/L (ref 98–111)
Creatinine, Ser: 1.14 mg/dL (ref 0.61–1.24)
GFR, Estimated: >60 mL/min (ref >60)
Glucose, Bld: 161 mg/dL — ABNORMAL HIGH (ref 70–99)
Potassium: 3.6 mmol/L (ref 3.5–5.1)
Sodium: 139 mmol/L (ref 135–145)

## 2024-05-17 LAB — HEPARIN LEVEL (UNFRACTIONATED): Heparin Unfractionated: 0.47 [IU]/mL (ref 0.30–0.70)

## 2024-05-17 MED ORDER — FUROSEMIDE 10 MG/ML IJ SOLN
20.0000 mg | Freq: Every day | INTRAMUSCULAR | Status: DC
  Administered 2024-05-17 – 2024-05-18 (×2): 20 mg via INTRAVENOUS
  Filled 2024-05-17 (×2): qty 2

## 2024-05-17 NOTE — Progress Notes (Signed)
 PROGRESS NOTE    Mike Hunter  FMW:[id number] DOB: Sep 01, 1950 DOA: 05/09/2024 PCP: Nikki Hansel Atlas, MD   Brief Narrative: 73 year old with past medical history significant for COPD, paroxysmal A-fib status post pacemaker on Xarelto , heart failure reduced ejection fraction 35 to 40%, CKD 3B, C5-C7 fusion surgery with hardware placement, chronic anemia, hyperlipidemia, urinary incontinence, anemia of chronic disease, bipolar disorder, dementia, obstructive sleep apnea on CPAP, morbid obesity, insulin -dependent diabetes type 2 presented to the ED complaining of falling out of his bed and ICD firing.  Chest x-ray showed bibasilar atelectasis.  Cardiomegaly without overt heart failure.  CT chest abdomen and pelvis no acute intrathoracic and intra-abdominal pelvic pathology.  Patchy dependent bilateral lower lobe consolidation, favor atelectasis over airspace disease.  CT cervical spine no acute abnormality.  Stable appearing anterior fusion hardware from C5-C7.  CT head no acute intracranial abnormality. Seen by cardiology no stress test needed.  He is a scheduled for cardiac ablation 12/1 as an outpatient.  During hospitalization patient develops A-fib with RVR, he was started on IV amiodarone . He develops  fever, cough, Started on IV Unasyn . Concern for aspiration, concern for esophageal dysphagia. Esophagogram abnormal. GI consulted. Needs to hold plavix  for 5 days prior to endoscopy.    Assessment & Plan:   Principal Problem:   Acute kidney injury superimposed on chronic kidney disease Active Problems:   Acute kidney injury superimposed on stage 3b chronic kidney disease (HCC)   Fall at home, initial encounter   HFrEF (heart failure with reduced ejection fraction) (HCC)   Insulin  dependent type 2 diabetes mellitus (HCC)   Hyperlipidemia   History of COPD   History of dementia   History of fusion of cervical spine   Chronic anemia   History of bipolar disorder   Obstructive sleep  apnea   Atrial fibrillation with RVR (HCC)   Aspiration pneumonia (HCC)   Dysphagia   Globus sensation   Anticoagulated   Antiplatelet or antithrombotic long-term use   Persistent atrial fibrillation (HCC)   ICD (implantable cardioverter-defibrillator) in place   Chronic systolic heart failure (HCC)  1-AKI on superimposed CKD stage IIIb: - CT abdomen and pelvis no acute intrathoracic abdominal or pelvic finding. - Holding torsemide , Jardiance and losartan . - Received IV fluids. -Cr base line 1.4--1.6 Renal function back to baseline.   Aspiration PNA.  Had  fever, cough, leukocytosis . CT showed atelectasis vs infiltrates bases.  Speech consulted. Concern for esophageal dysphagia.  Continue with  IV Protonix .  Continue with IV Unasyn , discontinue Vancomycin .  Blood culture: no growth to date.  . Lactic acid normalized.  Respiratory panel negative. Covid negative -11/18 chest x ray. Shows New mild right upper lobe airspace opacity, suspicious for pneumonia. -Continue with antibiotics.  -Several episodes of aspiration.  -Esophagogram: Barium tablet got stuck near the gastroesophageal junction. Mild tapering in this area. Difficult to exclude an esophageal stricture.  GI will follow up on patient. Holding Plavix .   Paroxysmal A-fib status post pacemaker placement On amiodarone  and Toprol , Xarelto  Initially scheduled for cardiac ablation 12/1 Developed A-fib with RVR and was started on IV amiodarone  Cardiology consulted.  Continue with amiodarone  gtt.  Started on  digoxin .   Dysphagia;  -Evaluated by speech, think more esophageal dysphagia.  -IV protonix .  -Recurrent aspiration event.  -Esophagogram: Barium tablet got stuck near the gastroesophageal junction. Mild tapering in this area. Difficult to exclude an esophageal stricture. -GI consulted. They will follow up on him today.   Mechanical fall at  home/physical deconditioning -PT OT consulted  Chest pain;  Sharp in  quality EKG ordered.  Multiples Troponin negative.  Cardiology following. Associated with aspiration PNA On IV amiodarone  for A fib  Heart failure reduced ejection fraction, POA, chronic NYHA III, ejection fraction 35 to 40% - Continue Toprol  and Imdur  Holding torsemide  Jardiance and losartan  and spironolactone  due to AKI -Received IV lasix  11/27 11/28, 12/30. -negative 6 L -Will schedule IV lasix .   COPD Report chest tightness.  Continue with nebulizer.  Received one dose of IV solumedrol 11/30.   Insulin -dependent diabetes type 2: - On Tresiba   History of dementia: - Continue memantine  and donezepil  Obstructive sleep apnea on CPAP - Continue CPAP at bedtime  Hyperlipidemia: - Continue atorvastatin   History of bipolar disorder/depression - Continue Zoloft   History of chronic anemia - Monitor hemoglobin  Hypokalemia; Replaced.    Estimated body mass index is 41.31 kg/m as calculated from the following:   Height as of this encounter: 5' 10 (1.778 m).   Weight as of this encounter: 130.6 kg.   DVT prophylaxis: Xarelto  Code Status: Full code Family Communication: Care discussed with patient, wife over phone 11/29.  Disposition Plan:  Status is: Inpatient Remains inpatient appropriate because: Management of A-fib RVR, fever leukocytosis    Consultants:  Urology  Procedures:    Antimicrobials:    Subjective: He is feeling fair. Report wheezing at times.  Was able to tolerates clear breakfast  Objective: Vitals:   05/17/24 0522 05/17/24 0731 05/17/24 0745 05/17/24 1110  BP:  123/87  122/64  Pulse:  80 81 75  Resp: (!) 25 19 20 18   Temp:  97.6 F (36.4 C)  (!) 97.5 F (36.4 C)  TempSrc:  Oral  Oral  SpO2:  100% 98% 100%  Weight: 130.6 kg     Height:        Intake/Output Summary (Last 24 hours) at 05/17/2024 1325 Last data filed at 05/17/2024 9081 Gross per 24 hour  Intake 944.19 ml  Output 2400 ml  Net -1455.81 ml   Filed Weights    05/13/24 1500 05/17/24 0500 05/17/24 0522  Weight: 125.9 kg 130.6 kg 130.6 kg    Examination:  General exam: NAD Respiratory system: Crackles bases Cardiovascular system: S 1., S 2 RRR Gastrointestinal system: BS present, soft, nt Central nervous system: alert Extremities: no edema   Data Reviewed: I have personally reviewed following labs and imaging studies  CBC: Recent Labs  Lab 05/10/24 2324 05/12/24 0230 05/13/24 1015 05/14/24 0221 05/15/24 0200 05/16/24 0141 05/17/24 0232  WBC 16.2*   < > 16.0* 12.4* 12.3* 9.6 12.6*  NEUTROABS 14.0*  --   --   --   --   --   --   HGB 9.9*   < > 8.9* 7.9* 8.2* 8.0* 8.6*  HCT 34.3*   < > 31.1* 27.2* 27.9* 27.6* 30.2*  MCV 74.7*   < > 74.9* 74.5* 74.4* 74.4* 75.5*  PLT 358   < > 344 328 335 344 367   < > = values in this interval not displayed.   Basic Metabolic Panel: Recent Labs  Lab 05/10/24 2324 05/12/24 0230 05/13/24 1015 05/14/24 0221 05/15/24 0200 05/16/24 0141 05/17/24 0821  NA 140   < > 138 138 134* 137 139  K 3.9   < > 3.9 3.2* 3.3* 3.6 3.6  CL 105   < > 106 107 103 104 108  CO2 25   < > 21* 21* 22 23 22  GLUCOSE 208*   < > 271* 167* 164* 205* 161*  BUN 19   < > 18 16 14 15 14   CREATININE 1.57*   < > <0.30* 1.25* 1.05 1.07 1.14  CALCIUM  8.6*   < > 8.3* 8.0* 8.1* 7.9* 8.3*  MG 2.5*  --  2.1  --   --   --   --    < > = values in this interval not displayed.   GFR: Estimated Creatinine Clearance: 76 mL/min (by C-G formula based on SCr of 1.14 mg/dL). Liver Function Tests: No results for input(s): AST, ALT, ALKPHOS, BILITOT, PROT, ALBUMIN  in the last 168 hours.  No results for input(s): LIPASE, AMYLASE in the last 168 hours. No results for input(s): AMMONIA in the last 168 hours. Coagulation Profile: No results for input(s): INR, PROTIME in the last 168 hours. Cardiac Enzymes: No results for input(s): CKTOTAL, CKMB, CKMBINDEX, TROPONINI in the last 168 hours. BNP (last 3  results) Recent Labs    02/25/24 1520 04/05/24 1343 04/06/24 0554  PROBNP 2,137.0* 2,191.0* 2,599.0*   HbA1C: No results for input(s): HGBA1C in the last 72 hours. CBG: Recent Labs  Lab 05/16/24 1114 05/16/24 1517 05/16/24 2033 05/17/24 0606 05/17/24 1109  GLUCAP 167* 241* 207* 159* 184*   Lipid Profile: No results for input(s): CHOL, HDL, LDLCALC, TRIG, CHOLHDL, LDLDIRECT in the last 72 hours. Thyroid Function Tests: No results for input(s): TSH, T4TOTAL, FREET4, T3FREE, THYROIDAB in the last 72 hours.  Anemia Panel: No results for input(s): VITAMINB12, FOLATE, FERRITIN, TIBC, IRON , RETICCTPCT in the last 72 hours. Sepsis Labs: Recent Labs  Lab 05/11/24 0955 05/11/24 1322 05/11/24 1923 05/11/24 2140  LATICACIDVEN 1.6 2.5* 1.1 0.8    Recent Results (from the past 240 hours)  Urine Culture (for pregnant, neutropenic or urologic patients or patients with an indwelling urinary catheter)     Status: None   Collection Time: 05/11/24 10:34 AM   Specimen: Urine, Clean Catch  Result Value Ref Range Status   Specimen Description URINE, CLEAN CATCH  Final   Special Requests NONE  Final   Culture   Final    NO GROWTH Performed at [facility], [address] [place], [place] [address]    Report Status 05/13/2024 FINAL  Final  Culture, blood (Routine X 2) w Reflex to ID Panel     Status: None   Collection Time: 05/11/24 10:37 AM   Specimen: BLOOD  Result Value Ref Range Status   Specimen Description BLOOD RIGHT ANTECUBITAL  Final   Special Requests   Final    BOTTLES DRAWN AEROBIC ONLY Blood Culture results may not be optimal due to an inadequate volume of blood received in culture bottles   Culture   Final    NO GROWTH 5 DAYS Performed at [facility], [address], [place], [place] [address]    Report Status 05/16/2024 FINAL  Final  Culture, blood (Routine X 2) w Reflex to ID Panel     Status: None   Collection  Time: 05/11/24 10:43 AM   Specimen: BLOOD  Result Value Ref Range Status   Specimen Description BLOOD RIGHT ANTECUBITAL  Final   Special Requests   Final    BOTTLES DRAWN AEROBIC AND ANAEROBIC Blood Culture adequate volume   Culture   Final    NO GROWTH 5 DAYS Performed at [facility], [address], [place], [place] [address]    Report Status 05/16/2024 FINAL  Final  MRSA Next Gen by PCR, Nasal     Status: None   Collection Time: 05/11/24  6:47 PM   Specimen: Nasal Mucosa; Nasal Swab  Result Value Ref Range Status   MRSA by PCR Next Gen NOT DETECTED NOT DETECTED Final    Comment: (NOTE) The GeneXpert MRSA Assay (FDA approved for NASAL specimens only), is one component of a comprehensive MRSA colonization surveillance program. It is not intended to diagnose MRSA infection nor to guide or monitor treatment for MRSA infections. Test performance is not FDA approved in patients less than 60 years old. Performed at [facility], [address], [place], [place] [address]   Respiratory (~20 pathogens) panel by PCR     Status: None   Collection Time: 05/12/24  7:26 AM   Specimen: Nasopharyngeal Swab; Respiratory  Result Value Ref Range Status   Adenovirus NOT DETECTED NOT DETECTED Final   Coronavirus 229E NOT DETECTED NOT DETECTED Final    Comment: (NOTE) The Coronavirus on the Respiratory Panel, DOES NOT test for the novel  Coronavirus (2019 nCoV)    Coronavirus HKU1 NOT DETECTED NOT DETECTED Final   Coronavirus NL63 NOT DETECTED NOT DETECTED Final   Coronavirus OC43 NOT DETECTED NOT DETECTED Final   Metapneumovirus NOT DETECTED NOT DETECTED Final   Rhinovirus / Enterovirus NOT DETECTED NOT DETECTED Final   Influenza A NOT DETECTED NOT DETECTED Final   Influenza B NOT DETECTED NOT DETECTED Final   Parainfluenza Virus 1 NOT DETECTED NOT DETECTED Final   Parainfluenza Virus 2 NOT DETECTED NOT DETECTED Final   Parainfluenza Virus 3 NOT DETECTED NOT DETECTED Final    Parainfluenza Virus 4 NOT DETECTED NOT DETECTED Final   Respiratory Syncytial Virus NOT DETECTED NOT DETECTED Final   Bordetella pertussis NOT DETECTED NOT DETECTED Final   Bordetella Parapertussis NOT DETECTED NOT DETECTED Final   Chlamydophila pneumoniae NOT DETECTED NOT DETECTED Final   Mycoplasma pneumoniae NOT DETECTED NOT DETECTED Final    Comment: Performed at [facility], [address] [place], [place] [address]  SARS Coronavirus 2 by RT PCR (hospital order, performed in [facility] hospital lab) *cepheid single result test* Anterior Nasal Swab     Status: None   Collection Time: 05/13/24  3:11 PM   Specimen: Anterior Nasal Swab  Result Value Ref Range Status   SARS Coronavirus 2 by RT PCR NEGATIVE NEGATIVE Final    Comment: Performed at [facility], [address], [place], [place] [address]         Radiology Studies: DG ESOPHAGUS W SINGLE CM (SOL OR THIN BA) Result Date: 05/16/2024 CLINICAL DATA:  73 year old male. Endorses dysphagia. Request is for esophagram for further evaluation. EXAM: ESOPHAGUS/BARIUM SWALLOW/TABLET STUDY TECHNIQUE: Single contrast examination was performed using thin liquid barium. This exam was performed by Delon Beagle NP, and was supervised and interpreted by Dr. Juliene Balder. FLUOROSCOPY: Radiation Exposure Index (as provided by the fluoroscopic device): 21.4 mGy Kerma COMPARISON:  None Available. FINDINGS: Swallowing: Appears normal. No vestibular penetration or aspiration seen. Pharynx: Unremarkable. Esophagus: Normal appearance of the esophagus. Mild tapering near the GE junction. Esophageal motility: Within normal limits. Hiatal Hernia: Not assessed Gastroesophageal reflux: None visualized. Ingested 13mm barium tablet: Got stuck at the GE junction. Did not pass with additional thin liquids. Other: None. IMPRESSION: 1. Barium tablet got stuck near the gastroesophageal junction. Mild tapering in this area. Difficult to exclude an  esophageal stricture. Consider further evaluation with endoscopy. Electronically Signed  By: Juliene Balder M.D.   On: 05/16/2024 11:46         Scheduled Meds:  atorvastatin   80 mg Oral QHS   budesonide   2 mL Nebulization BID   budesonide -formoterol   2 puff Inhalation BID   digoxin   0.125 mg Oral Daily   docusate sodium   100 mg Oral BID   donepezil   10 mg Oral QHS   doxepin   10-20 mg Oral QHS   feeding supplement  1 Container Oral BID BM   furosemide   20 mg Intravenous Daily   guaiFENesin   1,200 mg Oral BID   insulin  aspart  0-5 Units Subcutaneous QHS   insulin  aspart  0-6 Units Subcutaneous TID WC   insulin  glargine-yfgn  40 Units Subcutaneous q morning   iron  polysaccharides  150 mg Oral BID   isosorbide  mononitrate  30 mg Oral Daily   memantine   10 mg Oral BID   metoprolol  succinate  100 mg Oral BID   mirabegron  ER  50 mg Oral Daily   montelukast   10 mg Oral QHS   oxybutynin   10 mg Oral QHS   pantoprazole  (PROTONIX ) IV  40 mg Intravenous Q12H   sertraline   100 mg Oral Daily   sodium chloride  flush  3 mL Intravenous Q12H   sodium chloride  flush  3 mL Intravenous Q12H   Continuous Infusions:  amiodarone  30 mg/hr (05/17/24 9386)   ampicillin -sulbactam (UNASYN ) IV 3 g (05/17/24 1152)   heparin  1,950 Units/hr (05/17/24 0904)     LOS: 8 days    Time spent: 35 minutes    Edin Kon A Lowen Barringer, MD [facility]   If 7PM-7AM, please contact night-coverage [email or web address]  05/17/2024, 1:25 PM

## 2024-05-17 NOTE — Progress Notes (Signed)
 Speech Language Pathology Treatment: Dysphagia  Patient Details Name: Mike Hunter MRN: [id number] DOB: [birth year] Today's Date: 05/17/2024 Time: 1051-1101 SLP Time Calculation (min) (ACUTE ONLY): 10 min  Assessment / Plan / Recommendation Clinical Impression  Reviewed results of 11/28 MBS, reinforcing that no aspiration was observed with large volumes of liquids and solids. Discussed that thin liquids remained above the vocal folds and were spontaneously expelled, which is considered WFL. Pt is on a clear liquid diet per MD order so was observed drinking thin liquids without overt s/s of dysphagia or aspiration. Suspect post-prandial aspiration secondary to an esophageal component is most likely (GI consulted). From an oropharyngeal standpoint, pt can resume regular solids and thin liquids when deemed medically appropriate. Will continue following at least briefly for ongoing education.    HPI HPI: 73 yo male presenting to ED after falling out of bed and ICD firing. Admitted with AKI superimposed on CKD and A-fib with RVR, initially scheduled for an OP ablation 12/1 (cardiology following). CT Chest shows patchy dependent bilateral lower lobe consolidation, favor atelectasis over airspace disease. CXR 11/28 shows new mild RUL opacity, suspicious for pneumonia. Most recent SLP visit July 2025 with functional-appearing oropharyngeal swallow clinically and recommendations to f/u for an esophagram. FEES 06/04/23 also WFL. MBS 05/13/24 shows overall functional oropharyngeal swallowing with trace, transient penetration of thin liquids and frequent coughing in the absence of airway invasion. Barium tablet got stuck near the gastroesophageal junction with mild tapering. Difficult to exclude an esophageal stricture per esophagram 12/1. GI consult pending.   PMH: COPD, paroxysmal A-fib s/p pacemaker on Xarelto , HF reduced EF 35-40%, CKD 3B, C5-C7 fusion surgery with hardware placement, HLD, dementia, T2DM       SLP Plan  Continue with current plan of care        Swallow Evaluation Recommendations   Recommendations: PO diet PO Diet Recommendation: Regular;Thin liquids (Level 0) Liquid Administration via: Cup;Straw Medication Administration: Crushed with puree Supervision: Staff to assist with self-feeding;Intermittent supervision/cueing for swallowing strategies Postural changes: Position pt fully upright for meals;Stay upright 30-60 min after meals Oral care recommendations: Oral care BID (2x/day) Recommended consults: Consider esophageal assessment;Consider GI consultation     Recommendations                     Oral care BID   PRN Dysphagia, unspecified (R13.10)     Continue with current plan of care     Damien Blumenthal, M.A., CCC-SLP Speech Language Pathology, Acute Rehabilitation Services  Secure Chat preferred [phone]   05/17/2024, 11:36 AM

## 2024-05-17 NOTE — Plan of Care (Signed)

## 2024-05-17 NOTE — Progress Notes (Addendum)
 Patient ID: Mike Hunter, male   DOB: 1951-03-24, 73 y.o.   MRN: [id number]    Progress Note   Subjective   Day # 7 CC; dysphagia  IV heparin  Plavix  on hold since yesterday IV Unasyn   Barium swallow 05/16/2024-normal appearance of the esophagus with mild tapering near the GE junction, normal motility, ingested barium tablet stuck at GE junction and did not pass with additional thin liquids  Patient on O2 at 3 L, says he feels short of breath and definitely is not back to his baseline, no current GI complaints though has been having solid food dysphagia.  He also mentions that he was initially to be scheduled for outpatient EGD and colonoscopy with Dr. Joana Mary.  Colonoscopy was just for routine follow-up as it had been about 5 years, not having any current issues and not sure whether he had polyps at last exam.     Objective   Vital signs in last 24 hours: Temp:  [97.5 F (36.4 C)-97.8 F (36.6 C)] 97.5 F (36.4 C) (12/02 1110) Pulse Rate:  [69-89] 75 (12/02 1110) Resp:  [16-26] 18 (12/02 1110) BP: (113-142)/(64-90) 122/64 (12/02 1110) SpO2:  [98 %-100 %] 100 % (12/02 1110) Weight:  [130.6 kg] 130.6 kg (12/02 0522) Last BM Date : 05/17/24 General:   Chronically ill-appearing older white male, on O2, mild increased work of breathing Heart: Irregular regular rate and rhythm; no murmurs Lungs: Respirations even, mild increased work of breathing, scattered basilar rhonchi right greater than left Abdomen:  Soft, obese nontender and nondistended. Normal bowel sounds. Extremities:  Without edema. Neurologic:  Alert and oriented,  grossly normal neurologically. Psych:  Cooperative. Normal mood and affect.  Intake/Output from previous day: 12/01 0701 - 12/02 0700 In: 944.2 [P.O.:580; I.V.:364.2] Out: 1500 [Urine:1500] Intake/Output this shift: Total I/O In: -  Out: 900 [Urine:900]  Lab Results: Recent Labs    05/15/24 0200 05/16/24 0141 05/17/24 0232  WBC 12.3*  9.6 12.6*  HGB 8.2* 8.0* 8.6*  HCT 27.9* 27.6* 30.2*  PLT 335 344 367   BMET Recent Labs    05/15/24 0200 05/16/24 0141 05/17/24 0821  NA 134* 137 139  K 3.3* 3.6 3.6  CL 103 104 108  CO2 22 23 22   GLUCOSE 164* 205* 161*  BUN 14 15 14   CREATININE 1.05 1.07 1.14  CALCIUM  8.1* 7.9* 8.3*   LFT No results for input(s): PROT, ALBUMIN , AST, ALT, ALKPHOS, BILITOT, BILIDIR, IBILI in the last 72 hours. PT/INR No results for input(s): LABPROT, INR in the last 72 hours.  Studies/Results: DG ESOPHAGUS W SINGLE CM (SOL OR THIN BA) Result Date: 05/16/2024 CLINICAL DATA:  73 year old male. Endorses dysphagia. Request is for esophagram for further evaluation. EXAM: ESOPHAGUS/BARIUM SWALLOW/TABLET STUDY TECHNIQUE: Single contrast examination was performed using thin liquid barium. This exam was performed by Mike Beagle NP, and was supervised and interpreted by Dr. Juliene Hunter. FLUOROSCOPY: Radiation Exposure Index (as provided by the fluoroscopic device): 21.4 mGy Kerma COMPARISON:  None Available. FINDINGS: Swallowing: Appears normal. No vestibular penetration or aspiration seen. Pharynx: Unremarkable. Esophagus: Normal appearance of the esophagus. Mild tapering near the GE junction. Esophageal motility: Within normal limits. Hiatal Hernia: Not assessed Gastroesophageal reflux: None visualized. Ingested 13mm barium tablet: Got stuck at the GE junction. Did not pass with additional thin liquids. Other: None. IMPRESSION: 1. Barium tablet got stuck near the gastroesophageal junction. Mild tapering in this area. Difficult to exclude an esophageal stricture. Consider further evaluation with endoscopy. Electronically Signed  By: Mike Hunter M.D.   On: 05/16/2024 11:46       Assessment / Plan:    #18 73 year old white male admitted 1 week ago with complaint of chest pain had apparently fallen out of bed and had associated shortness of breath and palpitations, generalized  weakness  #2Atrial fibrillation, status post pacemaker, status post AICD-cardiology following, plan is for eventual cardioversion (after GI evaluation)-had been on Xarelto , on hold-currently on IV heparin   #3 acute kidney injury/superimposed on chronic kidney disease stage IIIb #4 chronic heart failure with EF 35 to 40% #5 insulin -dependent diabetes mellitus  #6 COPD uses home O2 at nighttime 3 L #7 community-acquired pneumonia-currently on IV Unasyn  #8 history of dementia, bipolar disorder  #9 coronary artery disease status post LAD PCI 10/2023-on chronic Plavix -on hold since yesterday #10 peripheral vascular disease status post left ICA  #11 dysphagia-solid food, gradually progressive-patient relates previous history of several EGDs with esophageal dilations, last done several years ago. Ba swallow yesterday with barium swallow hung up at GE junction and subtle narrowing at distal esophagus most likely distal esophageal stricture  Plan; soft diet Continue to hold Plavix -needs to be off for 5 days prior to EGD with dilation Patient is also on IV heparin , will need to be held for 6 hours preprocedures Plan for EGD with esophageal dilation for Friday, 05/20/2024 with Dr. Shila, procedure has been discussed in detail with the patient including indications risk benefits and he is agreeable to proceed. With his multiple comorbidities as outlined above he is at increased risk for complications with sedation. Daily Protonix  40 mg  He had mentioned that he was going to undergo a screening/surveillance colonoscopy.  I do not think he is appropriate for colonoscopy at this time unless he is having significant issues.  I believe cardiology planning AV node ablation which will need to be done after he completes his GI procedure as he will need to be anticoagulated.  GI will follow with you      Principal Problem:   Acute kidney injury superimposed on chronic kidney disease Active  Problems:   Insulin  dependent type 2 diabetes mellitus (HCC)   Acute kidney injury superimposed on stage 3b chronic kidney disease (HCC)   Hyperlipidemia   Fall at home, initial encounter   HFrEF (heart failure with reduced ejection fraction) (HCC)   History of COPD   History of dementia   History of fusion of cervical spine   Chronic anemia   History of bipolar disorder   Obstructive sleep apnea   Atrial fibrillation with RVR (HCC)   Aspiration pneumonia (HCC)   Dysphagia   Globus sensation   Anticoagulated   Antiplatelet or antithrombotic long-term use   Persistent atrial fibrillation (HCC)   ICD (implantable cardioverter-defibrillator) in place   Chronic systolic heart failure (HCC)     LOS: 8 days   Mike Esterwood PA-C 05/17/2024, 1:39 PM   Attending physician's note   I personally saw the patient and performed a substantive portion of the medical decision making process for this encounter (including a complete performance of the key components : MDM, Hx and Exam), in conjunction with the APP.  I agree with the APP's note, impression, and  the management plan for the number and complexity of problems addressed at the encounter for the patient and take responsibility for that plan with its inherent risk of complications, morbidity, or mortality with additional input as follows.      73 year old male with A-fib on Xarelto ,  CHF, CAD s/p PCI on chronic Plavix , CKD with worsening dysphagia Known history of esophageal stricture s/p multiple dilation in the past Reviewed barium esophagram, 13 mm barium tablet was hung up in the distal esophagus  Plan for EGD with esophageal dilation on Friday after Plavix  washout Hold IV heparin   6 hours prior to procedure on Friday N.p.o. after midnight on Thursday Continue PPI and soft diet as tolerated  I have spent >35 minutes of patient care (this includes precharting, chart review, review of results, face-to-face time used for counseling as  well as treatment plan and follow-up. The patient was provided an opportunity to ask questions and all were answered. The patient agreed with the plan and demonstrated an understanding of the instructions.   Mike Hunter , MD [phone]

## 2024-05-17 NOTE — Progress Notes (Signed)
 PT placed on CPAP at this time.   05/17/24 2214  BiPAP/CPAP/SIPAP  BiPAP/CPAP/SIPAP Pt Type Adult  BiPAP/CPAP/SIPAP Resmed  Mask Type Full face mask  Dentures removed? Not applicable  Mask Size Large  Set Rate 0 breaths/min  Respiratory Rate 18 breaths/min  FiO2 (%) 32 %  Flow Rate 3 lpm  Minute Ventilation 12  Leak 16  Peak Inspiratory Pressure (PIP) 9  Tidal Volume (Vt) 489  Patient Home Machine No  Patient Home Mask No  Patient Home Tubing No  Auto Titrate Yes  Minimum cmH2O 5 cmH2O  Maximum cmH2O 18 cmH2O  Nasal massage performed Yes  CPAP/SIPAP surface wiped down Yes  Device Plugged into RED Power Outlet Yes

## 2024-05-17 NOTE — Progress Notes (Signed)
 PHARMACY - ANTICOAGULATION CONSULT NOTE  Pharmacy Consult for heparin  (transition from Xarelto ) Indication: atrial fibrillation  Allergies  Allergen Reactions   Buprenorphine Hcl Nausea And Vomiting   Hydromorphone  Other (See Comments)    Mental Status Changes/HALLUCINATIONS   Mirtazapine Other (See Comments)    Nightmares & altered mental status    Codeine Nausea And Vomiting   Morphine  And Codeine Nausea And Vomiting    Patient Measurements: Height: 5' 10 (177.8 cm) Weight: 130.6 kg (287 lb 14.4 oz) IBW/kg (Calculated) : 73 HEPARIN  DW (KG): 101.6  Vital Signs: Temp: 97.6 F (36.4 C) (12/02 0731) Temp Source: Oral (12/02 0731) BP: 123/87 (12/02 0731) Pulse Rate: 81 (12/02 0745)  Labs: Recent Labs    05/14/24 1120 05/14/24 2058 05/14/24 2058 05/15/24 0200 05/15/24 1610 05/16/24 0141 05/17/24 0232 05/17/24 0821  HGB  --   --    < > 8.2*  --  8.0* 8.6*  --   HCT  --   --   --  27.9*  --  27.6* 30.2*  --   PLT  --   --   --  335  --  344 367  --   APTT 44* 68*  --   --   --   --   --   --   HEPARINUNFRC 0.30 0.32  --  0.26* 0.31 0.47 0.47  --   CREATININE  --   --   --  1.05  --  1.07  --  1.14  TROPONINIHS  --   --   --   --  7  --   --   --    < > = values in this interval not displayed.    Estimated Creatinine Clearance: 76 mL/min (by C-G formula based on SCr of 1.14 mg/dL).   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   05/08/2024   albuterol  (PROVENTIL  HFA;VENTOLIN  HFA) 108 (90 BASE) MCG/ACT inhaler Inhale 2 puffs into the lungs every 4 (four) hours as needed for wheezing or shortness of breath.   05/09/2024 Morning   amiodarone  (PACERONE ) 200 MG tablet Take 1 tablet (200 mg total) by mouth 2 (two) times daily. 60 tablet 3 05/09/2024 Morning   atorvastatin  (LIPITOR ) 80 MG tablet Take 80 mg by mouth at bedtime.   05/08/2024   azithromycin  (ZITHROMAX ) 250 MG tablet Take 250 mg by mouth every Monday, Wednesday, and Friday.   05/09/2024 Morning   budesonide  (PULMICORT ) 0.5 MG/2ML nebulizer solution Take 2 mLs by nebulization 2 (two) times daily.   05/09/2024 Morning   clopidogrel  (PLAVIX ) Mike MG tablet Take Mike mg by mouth daily.   05/09/2024 at  9:00 AM   dapagliflozin propanediol (FARXIGA) 10 MG TABS  tablet Take 10 mg by mouth daily.   05/09/2024 Morning   donepezil  (ARICEPT ) 10 MG tablet Take 10 mg by mouth at bedtime.   05/08/2024   doxepin  (SINEQUAN ) 10 MG capsule Take 10-20 mg by mouth at bedtime.   05/08/2024   insulin  aspart (NOVOLOG  FLEXPEN) 100 UNIT/ML injection Inject 0-13 Units into the skin See admin instructions. Inject 0-13 units into the skin three times a day with meals, PER SLIDING SCALE   05/09/2024 Morning   iron  polysaccharides (NIFEREX) 150 MG capsule Take 150 mg by mouth 2 (two) times daily.   05/09/2024 Morning   isosorbide  mononitrate (IMDUR ) 30 MG 24 hr tablet Take 30 mg by mouth daily.   05/09/2024 Morning   latanoprost  (XALATAN ) 0.005 % ophthalmic solution Place 1 drop into both eyes at bedtime.     05/08/2024   lidocaine  4 % Place 1 patch onto the skin daily. Apply to lower back.   Taking   losartan  (COZAAR ) 25 MG tablet Take 1 tablet (25 mg total) by mouth daily. 90 tablet 2 05/09/2024 Morning   memantine  (NAMENDA ) 10 MG tablet Take 10 mg by mouth 2 (two) times daily.    05/09/2024 Morning   metoprolol  succinate (TOPROL -XL) 50 MG 24 hr tablet Take 1 tablet (50 mg total) by mouth in the morning and at bedtime. Take with or immediately following a meal. 180 tablet 3 05/09/2024 at  9:00 AM   mirabegron  ER (MYRBETRIQ ) 50 MG TB24 tablet Take 50 mg by mouth daily.   05/09/2024 Morning   montelukast  (SINGULAIR ) 10 MG tablet Take 10 mg by mouth at bedtime.   05/08/2024   Multiple Vitamin (MULTIVITAMIN WITH MINERALS) TABS tablet Take 1 tablet by mouth daily with breakfast.   05/09/2024 Morning   Multiple Vitamins-Minerals (PRESERVISION AREDS PO) Take 2 capsules by mouth in the morning.   05/09/2024 Morning   omeprazole (PRILOSEC) 40 MG capsule Take 1 capsule by mouth daily after supper.   05/08/2024   oxybutynin  (DITROPAN -XL) 10 MG 24 hr tablet Take 10 mg by mouth daily.   05/09/2024 Morning   OXYGEN Inhale 3 L into the lungs daily as needed (Shortness of breath, wheezing).   05/09/2024   sertraline  (ZOLOFT ) 100 MG tablet Take 100 mg by mouth daily.   05/09/2024 Morning   spironolactone  (ALDACTONE ) 25 MG tablet Take 25 mg by mouth daily.   05/09/2024 Morning   Teriparatide  560 MCG/2.24ML SOPN Inject 20 mcg into the skin at bedtime.   05/08/2024   torsemide  (DEMADEX ) 20 MG tablet Take 1 tablet (20 mg total) by mouth daily. (Patient taking differently: Take 20 mg by mouth 2 (two) times daily at 10 AM and 5 PM.)   05/09/2024 Morning   TRESIBA  FLEXTOUCH 100 UNIT/ML FlexTouch Pen Inject 40-46 Units into the skin See admin instructions. Inject 40 units into the skin in the morning and 46 units at bedtime   05/09/2024 Morning   WIXELA INHUB 250-50 MCG/ACT AEPB Inhale 2 puffs into the lungs in the morning.   05/09/2024 Morning   XARELTO  15 MG TABS tablet Take 15 mg by mouth daily with supper.   Taking   benzonatate  (TESSALON ) 200 MG capsule Take 1 capsule (200 mg total) by mouth 3 (three) times daily as needed for cough. (Patient not taking: Reported on 05/10/2024) 20 capsule 0 Not  Taking   Scheduled:   atorvastatin   80 mg Oral QHS   budesonide   2 mL Nebulization BID   budesonide -formoterol   2 puff Inhalation BID  digoxin   0.125 mg Oral Daily   docusate sodium   100 mg Oral BID   donepezil   10 mg Oral QHS   doxepin   10-20 mg Oral QHS   feeding supplement  1 Container Oral BID BM   guaiFENesin   1,200 mg Oral BID   insulin  aspart  0-5 Units Subcutaneous QHS   insulin  aspart  0-6 Units Subcutaneous TID WC   insulin  glargine-yfgn  40 Units Subcutaneous q morning   iron  polysaccharides  150 mg Oral BID   isosorbide  mononitrate  30 mg Oral Daily   memantine   10 mg Oral BID   metoprolol  succinate  100 mg Oral BID   mirabegron  ER  50 mg Oral Daily   montelukast   10 mg Oral QHS   oxybutynin   10 mg Oral QHS   pantoprazole  (PROTONIX ) IV  40 mg Intravenous Q12H   sertraline   100 mg Oral Daily   sodium chloride  flush  3 mL Intravenous Q12H   sodium chloride  flush  3 mL Intravenous Q12H    Assessment: Mike Hunter w/ PMH significant for AF on Xarelto  PTA [last known dose 11/27 1659] who needs heparin  therapy 2/2 need for EGD w/ esophageal dilation.  No history noted for stroke, intracranial bleed, or GIB from review of chart. He had an ICD placed 03/01/2024 - possible upgrade this admit   Heparin  level therapeutic at 0.47 on heparin  drip rate 1950 uts/hr. CBC stable: Hgb 8.0, plt 300s. No signs/sx of bleeding noted.  Goal of Therapy:  Heparin  level 0.3-0.7 units/ml Monitor platelets by anticoagulation protocol: Yes   Plan:  Continue IV heparin  at 1950 units/h Daily heparin  level and CBC Monitor s/s bleeding  F/u transition back to rivaroxaban  20mg  every day after procedures complete   Olam Chalk Pharm.D. CPP, BCPS Clinical Pharmacist [phone] 05/17/2024 10:33 AM

## 2024-05-17 NOTE — Progress Notes (Signed)
 Progress Note  Patient Name: Mike Hunter Floor Date of Encounter: 05/17/2024 [facility] Cardiologist: Jerel Balding, MD   Interval Summary    No new complaints or concerns He would be agreeable for EP procedures to be out patient pending GI eval completion  Vital Signs Vitals:   05/17/24 0500 05/17/24 0522 05/17/24 0731 05/17/24 0745  BP:   123/87   Pulse:   80 81  Resp:  (!) 25 19 20   Temp:   97.6 F (36.4 C)   TempSrc:   Oral   SpO2:   100% 98%  Weight: 130.6 kg 130.6 kg    Height:        Intake/Output Summary (Last 24 hours) at 05/17/2024 0914 Last data filed at 05/17/2024 0401 Gross per 24 hour  Intake 944.19 ml  Output 1500 ml  Net -555.81 ml      05/17/2024    5:22 AM 05/17/2024    5:00 AM 05/13/2024    3:00 PM  Last 3 Weights  Weight (lbs) 287 lb 14.4 oz 287 lb 14.4 oz 277 lb 9 oz  Weight (kg) 130.591 kg 130.591 kg 125.9 kg      Telemetry/ECG/cardiology data  03/04/24: TEE 1. Left ventricular ejection fraction, by estimation, is 35 to 40%. The  left ventricle has moderately decreased function. The left ventricle  demonstrates global hypokinesis. Left ventricular diastolic function could  not be evaluated.   2. Right ventricular systolic function is mildly reduced. The right  ventricular size is normal. The estimated right ventricular systolic  pressure is 35.0 mmHg.   3. Left atrial size was moderately dilated. No left atrial/left atrial  appendage thrombus was detected. The LAA emptying velocity was 23 cm/s.   4. Right atrial size was mildly dilated.   5. The mitral valve is normal in structure. Trivial mitral valve  regurgitation. No evidence of mitral stenosis.   6. Possibly a functionally bicuspid aortic valve. There is a calcified  raphe between the left and noncoronary cusps. The aortic valve is  abnormal. There is mild calcification of the aortic valve. There is mild  thickening of the aortic valve. Aortic  valve regurgitation is not  visualized. Aortic valve  sclerosis/calcification is present, without any evidence of aortic  stenosis.   7. There is Moderate (Grade III) plaque involving the descending aorta.    C.MRI 02/29/24 IMPRESSION: 1. LVEF 35% in the setting of AF RVR. 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 In correlation with lab testing showing no acute infarction this admission, this is less likely a viable territory.   2.  RVEF 31% in the setting of AF RVR.   3.  Bilateral pleural effusions incidentally noted.     02/26/24: TTE 1. Left ventricular ejection fraction, by estimation, is 45 to 50%. The  left ventricle has mildly decreased function. The left ventricle  demonstrates global hypokinesis. The left ventricular internal cavity size  was mildly dilated. Left ventricular  diastolic parameters are indeterminate.   2. Right ventricular systolic function is normal. The right ventricular  size is normal. There is mildly elevated pulmonary artery systolic  pressure. The estimated right ventricular systolic pressure is 37.8 mmHg.   3. Left atrial size was mildly dilated.   4. The mitral valve is normal in structure. Mild mitral valve  regurgitation.   5. The aortic valve is calcified. Aortic valve regurgitation is not  visualized. Mild to moderate aortic valve stenosis. Vmax 2.8 m/s, MG  21  mmHg, AVA 1.6 cm^2, DI 0.45   6. The inferior vena cava is normal in size with greater than 50%  respiratory variability, suggesting right atrial pressure of 3 mmHg.      11/06/23: LHC/PCI DIAGNOSTIC FINDINGS:    1.  Borderline stenosis of the mid LAD.  RFR abnormal, 0.73.  2.  Otherwise nonobstructive CAD.  3.  Aortic stenosis with mean gradient 23 mmHg by pullback.   PCI:    Successful angioplasty and stenting of the LAD with 2 overlapping stents.   The area of borderline stenosis was focal, but there is atherosclerosis  throughout the LAD which required long segment  stenting.  A 2.75 x 48 mm  had a 3.5 x 38 mm Xience drug-eluting stent were used.  This was  aggressively postdilated with noncompliant balloons and IVUS was used for  stent optimization.      TELEMETRY AFib, mostly 80's, no brady, - Personally Reviewed   No new EKGs   Physical Exam   The patient was seen/examined by Dr. Almetta this morning Unchanged from yesterday General: Chronically ill appearing, in no acute distress.  Neck: No JVD.  Cardiac: irreg-irreg Resp: not actively wheezing, deep breaths provoke coughing Ext: No edema.  Neuro: No gross focal deficits.  Psych: Normal affect.      Assessment & Plan  Mike Hunter is a 73 y.o. 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  Found to have likely aspiration pneumonia with fever and leukocytosis. Ongoing aspiration events.    Problem List:  AF/RVR AFL Known for him on Xarelto  out pt > heparin  here Had some RVR though fairly brief last night > was aware with some heaviness in his chest Rate controlled otherwise  For now: Continue amiodarone  gtt,, dig, toprol , heparin   -Will need antibiotic course for any infectious issues, and GI w/u to be complete prior to any device upgrade. WBC back up a little again today, no fever.  -Ultimately, plan will be to have AVN ablation and upgrade to CRT, hopefully prior to discharge, though discussed this may be out patient given need to complete GI evaluation/procedures 1st   Hx of Sustained VT w/ICD in place None here or by device interrogation HFrEF  CAD s/p LAD PCI  S/p PCI to LAD May 2025, on Plavix  CP felt to be MSK/reproducible >neg Trop, no acute EKG changes appreciated Is absolutely reproduced/worsened with palpation of his chest  wall  Yesterday d/w his attending cardiologist > ok for Plavix  hold for EGD (5 days requested)    Further with IM/attending, GI teams   Aspiration pneumonia Anemia Deferred to attending team  H/H trickling down > stable this AM, follow, deferred to IM Reports wheezing with ambulation Deep breaths for exam provoke coughing fit  Abnormal esophogram >> pending EGD it seems, await GI comments/recs today Though in secure chat discussion yesterday >> likely not until Friday >> off Plavix       Signed, Charlies Macario Arthur, PA-C

## 2024-05-17 NOTE — Care Management Important Message (Signed)
 Important Message  Patient Details  Name: Mike Hunter MRN: [id number] Date of Birth: 04-07-1951   Important Message Given:  Yes - Medicare IM     Claretta Deed 05/17/2024, 12:12 PM

## 2024-05-17 NOTE — H&P (View-Only) (Signed)
 Patient ID: Mike Hunter, male   DOB: 1951-03-24, 73 y.o.   MRN: [id number]    Progress Note   Subjective   Day # 7 CC; dysphagia  IV heparin  Plavix  on hold since yesterday IV Unasyn   Barium swallow 05/16/2024-normal appearance of the esophagus with mild tapering near the GE junction, normal motility, ingested barium tablet stuck at GE junction and did not pass with additional thin liquids  Patient on O2 at 3 L, says he feels short of breath and definitely is not back to his baseline, no current GI complaints though has been having solid food dysphagia.  He also mentions that he was initially to be scheduled for outpatient EGD and colonoscopy with Dr. Joana Mary.  Colonoscopy was just for routine follow-up as it had been about 5 years, not having any current issues and not sure whether he had polyps at last exam.     Objective   Vital signs in last 24 hours: Temp:  [97.5 F (36.4 C)-97.8 F (36.6 C)] 97.5 F (36.4 C) (12/02 1110) Pulse Rate:  [69-89] 75 (12/02 1110) Resp:  [16-26] 18 (12/02 1110) BP: (113-142)/(64-90) 122/64 (12/02 1110) SpO2:  [98 %-100 %] 100 % (12/02 1110) Weight:  [130.6 kg] 130.6 kg (12/02 0522) Last BM Date : 05/17/24 General:   Chronically ill-appearing older white male, on O2, mild increased work of breathing Heart: Irregular regular rate and rhythm; no murmurs Lungs: Respirations even, mild increased work of breathing, scattered basilar rhonchi right greater than left Abdomen:  Soft, obese nontender and nondistended. Normal bowel sounds. Extremities:  Without edema. Neurologic:  Alert and oriented,  grossly normal neurologically. Psych:  Cooperative. Normal mood and affect.  Intake/Output from previous day: 12/01 0701 - 12/02 0700 In: 944.2 [P.O.:580; I.V.:364.2] Out: 1500 [Urine:1500] Intake/Output this shift: Total I/O In: -  Out: 900 [Urine:900]  Lab Results: Recent Labs    05/15/24 0200 05/16/24 0141 05/17/24 0232  WBC 12.3*  9.6 12.6*  HGB 8.2* 8.0* 8.6*  HCT 27.9* 27.6* 30.2*  PLT 335 344 367   BMET Recent Labs    05/15/24 0200 05/16/24 0141 05/17/24 0821  NA 134* 137 139  K 3.3* 3.6 3.6  CL 103 104 108  CO2 22 23 22   GLUCOSE 164* 205* 161*  BUN 14 15 14   CREATININE 1.05 1.07 1.14  CALCIUM  8.1* 7.9* 8.3*   LFT No results for input(s): PROT, ALBUMIN , AST, ALT, ALKPHOS, BILITOT, BILIDIR, IBILI in the last 72 hours. PT/INR No results for input(s): LABPROT, INR in the last 72 hours.  Studies/Results: DG ESOPHAGUS W SINGLE CM (SOL OR THIN BA) Result Date: 05/16/2024 CLINICAL DATA:  73 year old male. Endorses dysphagia. Request is for esophagram for further evaluation. EXAM: ESOPHAGUS/BARIUM SWALLOW/TABLET STUDY TECHNIQUE: Single contrast examination was performed using thin liquid barium. This exam was performed by Delon Beagle NP, and was supervised and interpreted by Dr. Juliene Balder. FLUOROSCOPY: Radiation Exposure Index (as provided by the fluoroscopic device): 21.4 mGy Kerma COMPARISON:  None Available. FINDINGS: Swallowing: Appears normal. No vestibular penetration or aspiration seen. Pharynx: Unremarkable. Esophagus: Normal appearance of the esophagus. Mild tapering near the GE junction. Esophageal motility: Within normal limits. Hiatal Hernia: Not assessed Gastroesophageal reflux: None visualized. Ingested 13mm barium tablet: Got stuck at the GE junction. Did not pass with additional thin liquids. Other: None. IMPRESSION: 1. Barium tablet got stuck near the gastroesophageal junction. Mild tapering in this area. Difficult to exclude an esophageal stricture. Consider further evaluation with endoscopy. Electronically Signed  By: Juliene Balder M.D.   On: 05/16/2024 11:46       Assessment / Plan:    #18 73 year old white male admitted 1 week ago with complaint of chest pain had apparently fallen out of bed and had associated shortness of breath and palpitations, generalized  weakness  #2Atrial fibrillation, status post pacemaker, status post AICD-cardiology following, plan is for eventual cardioversion (after GI evaluation)-had been on Xarelto , on hold-currently on IV heparin   #3 acute kidney injury/superimposed on chronic kidney disease stage IIIb #4 chronic heart failure with EF 35 to 40% #5 insulin -dependent diabetes mellitus  #6 COPD uses home O2 at nighttime 3 L #7 community-acquired pneumonia-currently on IV Unasyn  #8 history of dementia, bipolar disorder  #9 coronary artery disease status post LAD PCI 10/2023-on chronic Plavix -on hold since yesterday #10 peripheral vascular disease status post left ICA  #11 dysphagia-solid food, gradually progressive-patient relates previous history of several EGDs with esophageal dilations, last done several years ago. Ba swallow yesterday with barium swallow hung up at GE junction and subtle narrowing at distal esophagus most likely distal esophageal stricture  Plan; soft diet Continue to hold Plavix -needs to be off for 5 days prior to EGD with dilation Patient is also on IV heparin , will need to be held for 6 hours preprocedures Plan for EGD with esophageal dilation for Friday, 05/20/2024 with Dr. Shila, procedure has been discussed in detail with the patient including indications risk benefits and he is agreeable to proceed. With his multiple comorbidities as outlined above he is at increased risk for complications with sedation. Daily Protonix  40 mg  He had mentioned that he was going to undergo a screening/surveillance colonoscopy.  I do not think he is appropriate for colonoscopy at this time unless he is having significant issues.  I believe cardiology planning AV node ablation which will need to be done after he completes his GI procedure as he will need to be anticoagulated.  GI will follow with you      Principal Problem:   Acute kidney injury superimposed on chronic kidney disease Active  Problems:   Insulin  dependent type 2 diabetes mellitus (HCC)   Acute kidney injury superimposed on stage 3b chronic kidney disease (HCC)   Hyperlipidemia   Fall at home, initial encounter   HFrEF (heart failure with reduced ejection fraction) (HCC)   History of COPD   History of dementia   History of fusion of cervical spine   Chronic anemia   History of bipolar disorder   Obstructive sleep apnea   Atrial fibrillation with RVR (HCC)   Aspiration pneumonia (HCC)   Dysphagia   Globus sensation   Anticoagulated   Antiplatelet or antithrombotic long-term use   Persistent atrial fibrillation (HCC)   ICD (implantable cardioverter-defibrillator) in place   Chronic systolic heart failure (HCC)     LOS: 8 days   Amy Esterwood PA-C 05/17/2024, 1:39 PM   Attending physician's note   I personally saw the patient and performed a substantive portion of the medical decision making process for this encounter (including a complete performance of the key components : MDM, Hx and Exam), in conjunction with the APP.  I agree with the APP's note, impression, and  the management plan for the number and complexity of problems addressed at the encounter for the patient and take responsibility for that plan with its inherent risk of complications, morbidity, or mortality with additional input as follows.      73 year old male with A-fib on Xarelto ,  CHF, CAD s/p PCI on chronic Plavix , CKD with worsening dysphagia Known history of esophageal stricture s/p multiple dilation in the past Reviewed barium esophagram, 13 mm barium tablet was hung up in the distal esophagus  Plan for EGD with esophageal dilation on Friday after Plavix  washout Hold IV heparin   6 hours prior to procedure on Friday N.p.o. after midnight on Thursday Continue PPI and soft diet as tolerated  I have spent >35 minutes of patient care (this includes precharting, chart review, review of results, face-to-face time used for counseling as  well as treatment plan and follow-up. The patient was provided an opportunity to ask questions and all were answered. The patient agreed with the plan and demonstrated an understanding of the instructions.   LOIS Wilkie Mcgee , MD [phone]

## 2024-05-18 DIAGNOSIS — I4819 Other persistent atrial fibrillation: Secondary | ICD-10-CM | POA: Diagnosis not present

## 2024-05-18 LAB — BASIC METABOLIC PANEL WITH GFR
Anion gap: 9 (ref 5–15)
BUN: 12 mg/dL (ref 8–23)
CO2: 25 mmol/L (ref 22–32)
Calcium: 8.1 mg/dL — ABNORMAL LOW (ref 8.9–10.3)
Chloride: 106 mmol/L (ref 98–111)
Creatinine, Ser: 1.22 mg/dL (ref 0.61–1.24)
GFR, Estimated: >60 mL/min (ref >60)
Glucose, Bld: 214 mg/dL — ABNORMAL HIGH (ref 70–99)
Potassium: 3.3 mmol/L — ABNORMAL LOW (ref 3.5–5.1)
Sodium: 140 mmol/L (ref 135–145)

## 2024-05-18 LAB — CBC
HCT: 30.2 % — ABNORMAL LOW (ref 39.0–52.0)
Hemoglobin: 8.7 g/dL — ABNORMAL LOW (ref 13.0–17.0)
MCH: 21.3 pg — ABNORMAL LOW (ref 26.0–34.0)
MCHC: 28.8 g/dL — ABNORMAL LOW (ref 30.0–36.0)
MCV: 74 fL — ABNORMAL LOW (ref 80.0–100.0)
Platelets: 367 K/uL (ref 150–400)
RBC: 4.08 MIL/uL — ABNORMAL LOW (ref 4.22–5.81)
RDW: 18.2 % — ABNORMAL HIGH (ref 11.5–15.5)
WBC: 10.3 K/uL (ref 4.0–10.5)
nRBC: 0.2 % (ref 0.0–0.2)

## 2024-05-18 LAB — HEPARIN LEVEL (UNFRACTIONATED): Heparin Unfractionated: 0.41 [IU]/mL (ref 0.30–0.70)

## 2024-05-18 LAB — GLUCOSE, CAPILLARY
Glucose-Capillary: 211 mg/dL — ABNORMAL HIGH (ref 70–99)
Glucose-Capillary: 189 mg/dL — ABNORMAL HIGH (ref 70–99)
Glucose-Capillary: 203 mg/dL — ABNORMAL HIGH (ref 70–99)
Glucose-Capillary: 206 mg/dL — ABNORMAL HIGH (ref 70–99)

## 2024-05-18 LAB — MAGNESIUM: Magnesium: 1.9 mg/dL (ref 1.7–2.4)

## 2024-05-18 MED ORDER — POTASSIUM CHLORIDE CRYS ER 20 MEQ PO TBCR: 40.0000 meq | EXTENDED_RELEASE_TABLET | Freq: Once | ORAL | Status: DC

## 2024-05-18 MED ORDER — POTASSIUM CHLORIDE CRYS ER 20 MEQ PO TBCR
30.0000 meq | EXTENDED_RELEASE_TABLET | Freq: Two times a day (BID) | ORAL | Status: AC
  Administered 2024-05-18 (×2): 30 meq via ORAL
  Filled 2024-05-18 (×2): qty 1

## 2024-05-18 NOTE — Progress Notes (Signed)
 PROGRESS NOTE    Mike Hunter  FMW:[id number] DOB: 1951-03-14 DOA: 05/09/2024 PCP: Nikki Hansel Atlas, MD   Brief Narrative: 73 year old with past medical history significant for COPD, paroxysmal A-fib status post pacemaker on Xarelto , heart failure reduced ejection fraction 35 to 40%, CKD 3B, C5-C7 fusion surgery with hardware placement, chronic anemia, hyperlipidemia, urinary incontinence, anemia of chronic disease, bipolar disorder, dementia, obstructive sleep apnea on CPAP, morbid obesity, insulin -dependent diabetes type 2 presented to the ED complaining of falling out of his bed and ICD firing.  Chest x-ray showed bibasilar atelectasis.  Cardiomegaly without overt heart failure.  CT chest abdomen and pelvis no acute intrathoracic and intra-abdominal pelvic pathology.  Patchy dependent bilateral lower lobe consolidation, favor atelectasis over airspace disease.  CT cervical spine no acute abnormality.  Stable appearing anterior fusion hardware from C5-C7.  CT head no acute intracranial abnormality. Seen by cardiology no stress test needed.  He is a scheduled for cardiac ablation 12/1 as an outpatient.  During hospitalization patient develops A-fib with RVR, he was started on IV amiodarone . He develops  fever, cough, Started on IV Unasyn . Concern for aspiration, concern for esophageal dysphagia. Esophagogram abnormal. GI consulted. Needs to hold plavix  for 5 days prior to endoscopy.    Assessment & Plan:   Principal Problem:   Acute kidney injury superimposed on chronic kidney disease Active Problems:   Acute kidney injury superimposed on stage 3b chronic kidney disease (HCC)   Fall at home, initial encounter   HFrEF (heart failure with reduced ejection fraction) (HCC)   Insulin  dependent type 2 diabetes mellitus (HCC)   Hyperlipidemia   History of COPD   History of dementia   History of fusion of cervical spine   Chronic anemia   History of bipolar disorder   Obstructive sleep  apnea   Atrial fibrillation with RVR (HCC)   Aspiration pneumonia (HCC)   Dysphagia   Globus sensation   Anticoagulated   Antiplatelet or antithrombotic long-term use   Persistent atrial fibrillation (HCC)   ICD (implantable cardioverter-defibrillator) in place   Chronic systolic heart failure (HCC)   Esophageal stricture  1-AKI on superimposed CKD stage IIIb: - CT abdomen and pelvis no acute intrathoracic abdominal or pelvic finding. - Holding torsemide , Jardiance and losartan . - Received IV fluids. -Cr base line 1.4--1.6 Renal function back to baseline.   Aspiration PNA.  Had  fever, cough, leukocytosis . CT showed atelectasis vs infiltrates bases.  Speech consulted. Concern for esophageal dysphagia.  Continue with  IV Protonix .  Continue with IV Unasyn , discontinue Vancomycin .  Blood culture: no growth to date.  . Lactic acid normalized.  Respiratory panel negative. Covid negative -11/18 chest x ray. Shows New mild right upper lobe airspace opacity, suspicious for pneumonia. -Continue with antibiotics.  -Several episodes of aspiration.  -Esophagogram: Barium tablet got stuck near the gastroesophageal junction. Mild tapering in this area. Difficult to exclude an esophageal stricture.  GI will follow up on patient. Holding Plavix .   Paroxysmal A-fib status post pacemaker placement On amiodarone  and Toprol , Xarelto  Initially scheduled for cardiac ablation 12/1 Developed A-fib with RVR and was started on IV amiodarone  Cardiology plans for AVN ablation and upgrade to CRT prior to discharge. Continue with amiodarone  gtt.  Started on  digoxin .   Dysphagia;  -Evaluated by speech, think more esophageal dysphagia.  -IV protonix .  -Recurrent aspiration event.  -Esophagogram: Barium tablet got stuck near the gastroesophageal junction. Mild tapering in this area. Difficult to exclude an esophageal stricture. GI  plans for EGD on Friday  Mechanical fall at home/physical  deconditioning -PT OT consulted  Chest pain;  Sharp in quality EKG ordered.  Multiples Troponin negative.  Cardiology following. Associated with aspiration PNA On IV amiodarone  for A fib  Heart failure reduced ejection fraction, POA, chronic NYHA III, ejection fraction 35 to 40% - Continue Toprol  and Imdur  Holding torsemide  Jardiance and losartan  and spironolactone  due to AKI -Received IV lasix  11/27 11/28, 12/30. -negative 6 L -Will schedule IV lasix .   COPD Report chest tightness.  Continue with nebulizer.  Received one dose of IV solumedrol 11/30.   Insulin -dependent diabetes type 2: - On Tresiba   History of dementia: - Continue memantine  and donezepil  Obstructive sleep apnea on CPAP - Continue CPAP at bedtime  Hyperlipidemia: - Continue atorvastatin   History of bipolar disorder/depression - Continue Zoloft   History of chronic anemia - Monitor hemoglobin  Hypokalemia; Replaced.    Estimated body mass index is 41.22 kg/m as calculated from the following:   Height as of this encounter: 5' 10 (1.778 m).   Weight as of this encounter: 130.3 kg.   DVT prophylaxis: Xarelto  Code Status: Full code Family Communication: Care discussed with patient, wife over phone 11/29.  Disposition Plan:  Status is: Inpatient Remains inpatient appropriate because: Management of A-fib RVR, fever leukocytosis    Consultants:  Urology  Procedures:    Antimicrobials:    Subjective: Seen at bedside this morning, reports that his breathing hard.  He on 2 L of oxygen baseline of 3 L at home.  Objective: Vitals:   05/17/24 2300 05/18/24 0500 05/18/24 0735 05/18/24 0836  BP: 111/72 131/71 97/63 116/72  Pulse: (!) 103 78 71 77  Resp: 16 15 20  (!) 23  Temp: 98.1 F (36.7 C)  97.7 F (36.5 C) 97.9 F (36.6 C)  TempSrc: Oral Oral Oral Oral  SpO2: 100%  100% 98%  Weight:  130.3 kg    Height:  5' 10 (1.778 m)      Intake/Output Summary (Last 24 hours) at 05/18/2024  0854 Last data filed at 05/18/2024 0423 Gross per 24 hour  Intake 2569.5 ml  Output 2200 ml  Net 369.5 ml   Filed Weights   05/17/24 0500 05/17/24 0522 05/18/24 0500  Weight: 130.6 kg 130.6 kg 130.3 kg    Examination:  General exam: NAD Respiratory system: Crackles bases Cardiovascular system: S 1., S 2 RRR Gastrointestinal system: BS present, soft, nt Central nervous system: alert Extremities: no edema   Data Reviewed: I have personally reviewed following labs and imaging studies  CBC: Recent Labs  Lab 05/14/24 0221 05/15/24 0200 05/16/24 0141 05/17/24 0232 05/18/24 0250  WBC 12.4* 12.3* 9.6 12.6* 10.3  HGB 7.9* 8.2* 8.0* 8.6* 8.7*  HCT 27.2* 27.9* 27.6* 30.2* 30.2*  MCV 74.5* 74.4* 74.4* 75.5* 74.0*  PLT 328 335 344 367 367   Basic Metabolic Panel: Recent Labs  Lab 05/13/24 1015 05/14/24 0221 05/15/24 0200 05/16/24 0141 05/17/24 0821 05/18/24 0250  NA 138 138 134* 137 139 140  K 3.9 3.2* 3.3* 3.6 3.6 3.3*  CL 106 107 103 104 108 106  CO2 21* 21* 22 23 22 25   GLUCOSE 271* 167* 164* 205* 161* 214*  BUN 18 16 14 15 14 12   CREATININE <0.30* 1.25* 1.05 1.07 1.14 1.22  CALCIUM  8.3* 8.0* 8.1* 7.9* 8.3* 8.1*  MG 2.1  --   --   --   --   --    GFR: Estimated Creatinine Clearance:  71 mL/min (by C-G formula based on SCr of 1.22 mg/dL). Liver Function Tests: No results for input(s): AST, ALT, ALKPHOS, BILITOT, PROT, ALBUMIN  in the last 168 hours.  No results for input(s): LIPASE, AMYLASE in the last 168 hours. No results for input(s): AMMONIA in the last 168 hours. Coagulation Profile: No results for input(s): INR, PROTIME in the last 168 hours. Cardiac Enzymes: No results for input(s): CKTOTAL, CKMB, CKMBINDEX, TROPONINI in the last 168 hours. BNP (last 3 results) Recent Labs    02/25/24 1520 04/05/24 1343 04/06/24 0554  PROBNP 2,137.0* 2,191.0* 2,599.0*   HbA1C: No results for input(s): HGBA1C in the last 72  hours. CBG: Recent Labs  Lab 05/17/24 0606 05/17/24 1109 05/17/24 1612 05/17/24 2130 05/18/24 0517  GLUCAP 159* 184* 215* 214* 211*   Lipid Profile: No results for input(s): CHOL, HDL, LDLCALC, TRIG, CHOLHDL, LDLDIRECT in the last 72 hours. Thyroid Function Tests: No results for input(s): TSH, T4TOTAL, FREET4, T3FREE, THYROIDAB in the last 72 hours.  Anemia Panel: No results for input(s): VITAMINB12, FOLATE, FERRITIN, TIBC, IRON , RETICCTPCT in the last 72 hours. Sepsis Labs: Recent Labs  Lab 05/11/24 0955 05/11/24 1322 05/11/24 1923 05/11/24 2140  LATICACIDVEN 1.6 2.5* 1.1 0.8    Recent Results (from the past 240 hours)  Urine Culture (for pregnant, neutropenic or urologic patients or patients with an indwelling urinary catheter)     Status: None   Collection Time: 05/11/24 10:34 AM   Specimen: Urine, Clean Catch  Result Value Ref Range Status   Specimen Description URINE, CLEAN CATCH  Final   Special Requests NONE  Final   Culture   Final    NO GROWTH Performed at [facility], [address], [place], [place] [address]    Report Status 05/13/2024 FINAL  Final  Culture, blood (Routine X 2) w Reflex to ID Panel     Status: None   Collection Time: 05/11/24 10:37 AM   Specimen: BLOOD  Result Value Ref Range Status   Specimen Description BLOOD RIGHT ANTECUBITAL  Final   Special Requests   Final    BOTTLES DRAWN AEROBIC ONLY Blood Culture results may not be optimal due to an inadequate volume of blood received in culture bottles   Culture   Final    NO GROWTH 5 DAYS Performed at [facility], [address], [place], [place] [address]    Report Status 05/16/2024 FINAL  Final  Culture, blood (Routine X 2) w Reflex to ID Panel     Status: None   Collection Time: 05/11/24 10:43 AM   Specimen: BLOOD  Result Value Ref Range Status   Specimen Description BLOOD RIGHT ANTECUBITAL  Final   Special Requests   Final    BOTTLES  DRAWN AEROBIC AND ANAEROBIC Blood Culture adequate volume   Culture   Final    NO GROWTH 5 DAYS Performed at [facility], [address] [place], [place] [address]    Report Status 05/16/2024 FINAL  Final  MRSA Next Gen by PCR, Nasal     Status: None   Collection Time: 05/11/24  6:47 PM   Specimen: Nasal Mucosa; Nasal Swab  Result Value Ref Range Status   MRSA by PCR Next Gen NOT DETECTED NOT DETECTED Final    Comment: (NOTE) The GeneXpert MRSA Assay (FDA approved for NASAL specimens only), is one component of a comprehensive MRSA colonization surveillance program. It is not intended to diagnose MRSA infection nor to guide or  monitor treatment for MRSA infections. Test performance is not FDA approved in patients less than 76 years old. Performed at [facility], [address], [place], [place] [address]   Respiratory (~20 pathogens) panel by PCR     Status: None   Collection Time: 05/12/24  7:26 AM   Specimen: Nasopharyngeal Swab; Respiratory  Result Value Ref Range Status   Adenovirus NOT DETECTED NOT DETECTED Final   Coronavirus 229E NOT DETECTED NOT DETECTED Final    Comment: (NOTE) The Coronavirus on the Respiratory Panel, DOES NOT test for the novel  Coronavirus (2019 nCoV)    Coronavirus HKU1 NOT DETECTED NOT DETECTED Final   Coronavirus NL63 NOT DETECTED NOT DETECTED Final   Coronavirus OC43 NOT DETECTED NOT DETECTED Final   Metapneumovirus NOT DETECTED NOT DETECTED Final   Rhinovirus / Enterovirus NOT DETECTED NOT DETECTED Final   Influenza A NOT DETECTED NOT DETECTED Final   Influenza B NOT DETECTED NOT DETECTED Final   Parainfluenza Virus 1 NOT DETECTED NOT DETECTED Final   Parainfluenza Virus 2 NOT DETECTED NOT DETECTED Final   Parainfluenza Virus 3 NOT DETECTED NOT DETECTED Final   Parainfluenza Virus 4 NOT DETECTED NOT DETECTED Final   Respiratory Syncytial Virus NOT DETECTED NOT DETECTED Final   Bordetella pertussis NOT DETECTED NOT DETECTED  Final   Bordetella Parapertussis NOT DETECTED NOT DETECTED Final   Chlamydophila pneumoniae NOT DETECTED NOT DETECTED Final   Mycoplasma pneumoniae NOT DETECTED NOT DETECTED Final    Comment: Performed at [facility], [address] [place], [place] [address]  SARS Coronavirus 2 by RT PCR (hospital order, performed in [facility] hospital lab) *cepheid single result test* Anterior Nasal Swab     Status: None   Collection Time: 05/13/24  3:11 PM   Specimen: Anterior Nasal Swab  Result Value Ref Range Status   SARS Coronavirus 2 by RT PCR NEGATIVE NEGATIVE Final    Comment: Performed at [facility] Lab, [address] [place], [place] [address]         Radiology Studies: DG ESOPHAGUS W SINGLE CM (SOL OR THIN BA) Result Date: 05/16/2024 CLINICAL DATA:  73 year old male. Endorses dysphagia. Request is for esophagram for further evaluation. EXAM: ESOPHAGUS/BARIUM SWALLOW/TABLET STUDY TECHNIQUE: Single contrast examination was performed using thin liquid barium. This exam was performed by Delon Beagle NP, and was supervised and interpreted by Dr. Juliene Balder. FLUOROSCOPY: Radiation Exposure Index (as provided by the fluoroscopic device): 21.4 mGy Kerma COMPARISON:  None Available. FINDINGS: Swallowing: Appears normal. No vestibular penetration or aspiration seen. Pharynx: Unremarkable. Esophagus: Normal appearance of the esophagus. Mild tapering near the GE junction. Esophageal motility: Within normal limits. Hiatal Hernia: Not assessed Gastroesophageal reflux: None visualized. Ingested 13mm barium tablet: Got stuck at the GE junction. Did not pass with additional thin liquids. Other: None. IMPRESSION: 1. Barium tablet got stuck near the gastroesophageal junction. Mild tapering in this area. Difficult to exclude an esophageal stricture. Consider further evaluation with endoscopy. Electronically Signed   By: Juliene Balder M.D.   On: 05/16/2024 11:46         Scheduled Meds:   atorvastatin   80 mg Oral QHS   budesonide   2 mL Nebulization BID   budesonide -formoterol   2 puff Inhalation BID   digoxin   0.125 mg Oral Daily   docusate sodium   100 mg Oral BID   donepezil   10 mg Oral QHS   doxepin   10-20 mg Oral QHS   feeding supplement  1  Container Oral BID BM   furosemide   20 mg Intravenous Daily   guaiFENesin   1,200 mg Oral BID   insulin  aspart  0-5 Units Subcutaneous QHS   insulin  aspart  0-6 Units Subcutaneous TID WC   insulin  glargine-yfgn  40 Units Subcutaneous q morning   iron  polysaccharides  150 mg Oral BID   isosorbide  mononitrate  30 mg Oral Daily   memantine   10 mg Oral BID   metoprolol  succinate  100 mg Oral BID   mirabegron  ER  50 mg Oral Daily   montelukast   10 mg Oral QHS   oxybutynin   10 mg Oral QHS   pantoprazole  (PROTONIX ) IV  40 mg Intravenous Q12H   sertraline   100 mg Oral Daily   sodium chloride  flush  3 mL Intravenous Q12H   sodium chloride  flush  3 mL Intravenous Q12H   Continuous Infusions:  amiodarone  30 mg/hr (05/18/24 0721)   ampicillin -sulbactam (UNASYN ) IV 3 g (05/18/24 0521)   heparin  1,950 Units/hr (05/18/24 0423)     LOS: 9 days    Time spent: 35 minutes    Landon FORBES Baller, MD [facility]   If 7PM-7AM, please contact night-coverage [email or web address]  05/18/2024, 8:54 AM

## 2024-05-18 NOTE — Plan of Care (Signed)

## 2024-05-18 NOTE — Progress Notes (Signed)
 PHARMACY - ANTICOAGULATION CONSULT NOTE  Pharmacy Consult for heparin  (transition from Xarelto ) Indication: atrial fibrillation  Allergies  Allergen Reactions   Buprenorphine Hcl Nausea And Vomiting   Hydromorphone  Other (See Comments)    Mental Status Changes/HALLUCINATIONS   Mirtazapine Other (See Comments)    Nightmares & altered mental status    Codeine Nausea And Vomiting   Morphine  And Codeine Nausea And Vomiting    Patient Measurements: Height: 5' 10 (177.8 cm) Weight: 130.3 kg (287 lb 4.2 oz) IBW/kg (Calculated) : 73 HEPARIN  DW (KG): 103  Vital Signs: Temp: 97.9 F (36.6 C) (12/03 0836) Temp Source: Oral (12/03 0836) BP: 116/72 (12/03 0836) Pulse Rate: 77 (12/03 0836)  Labs: Recent Labs    05/15/24 1610 05/15/24 1610 05/16/24 0141 05/17/24 0232 05/17/24 0821 05/18/24 0250  HGB  --    < > 8.0* 8.6*  --  8.7*  HCT  --   --  27.6* 30.2*  --  30.2*  PLT  --   --  344 367  --  367  HEPARINUNFRC 0.31  --  0.47 0.47  --  0.41  CREATININE  --   --  1.07  --  1.14 1.22  TROPONINIHS 7  --   --   --   --   --    < > = values in this interval not displayed.    Estimated Creatinine Clearance: 71 mL/min (by C-G formula based on SCr of 1.22 mg/dL).   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   05/08/2024   albuterol   (PROVENTIL  HFA;VENTOLIN  HFA) 108 (90 BASE) MCG/ACT inhaler Inhale 2 puffs into the lungs every 4 (four) hours as needed for wheezing or shortness of breath.   05/09/2024 Morning   amiodarone  (PACERONE ) 200 MG tablet Take 1 tablet (200 mg total) by mouth 2 (two) times daily. 60 tablet 3 05/09/2024 Morning   atorvastatin  (LIPITOR ) 80 MG tablet Take 80 mg by mouth at bedtime.   05/08/2024   azithromycin  (ZITHROMAX ) 250 MG tablet Take 250 mg by mouth every Monday, Wednesday, and Friday.   05/09/2024 Morning   budesonide  (PULMICORT ) 0.5 MG/2ML nebulizer solution Take 2 mLs by nebulization 2 (two) times daily.   05/09/2024 Morning   [EXPIRED] clopidogrel  (PLAVIX ) 75 MG tablet Take 75 mg by mouth daily.   05/09/2024 at  9:00 AM   dapagliflozin propanediol (FARXIGA) 10 MG TABS tablet Take 10 mg by mouth daily.   05/09/2024 Morning   donepezil  (ARICEPT ) 10 MG tablet Take 10 mg by mouth at bedtime.   05/08/2024   doxepin  (SINEQUAN ) 10 MG capsule Take 10-20 mg by mouth at bedtime.   05/08/2024   insulin  aspart (NOVOLOG  FLEXPEN) 100 UNIT/ML injection Inject 0-13 Units  into the skin See admin instructions. Inject 0-13 units into the skin three times a day with meals, PER SLIDING SCALE   05/09/2024 Morning   iron  polysaccharides (NIFEREX) 150 MG capsule Take 150 mg by mouth 2 (two) times daily.   05/09/2024 Morning   isosorbide  mononitrate (IMDUR ) 30 MG 24 hr tablet Take 30 mg by mouth daily.   05/09/2024 Morning   latanoprost  (XALATAN ) 0.005 % ophthalmic solution Place 1 drop into both eyes at bedtime.     05/08/2024   lidocaine  4 % Place 1 patch onto the skin daily. Apply to lower back.   Taking   losartan  (COZAAR ) 25 MG tablet Take 1 tablet (25 mg total) by mouth daily. 90 tablet 2 05/09/2024 Morning   memantine  (NAMENDA ) 10 MG tablet Take 10 mg by mouth 2 (two) times daily.   05/09/2024 Morning   metoprolol  succinate (TOPROL -XL) 50 MG 24 hr tablet Take 1 tablet (50 mg total) by mouth in the morning and at  bedtime. Take with or immediately following a meal. 180 tablet 3 05/09/2024 at  9:00 AM   mirabegron  ER (MYRBETRIQ ) 50 MG TB24 tablet Take 50 mg by mouth daily.   05/09/2024 Morning   montelukast  (SINGULAIR ) 10 MG tablet Take 10 mg by mouth at bedtime.   05/08/2024   Multiple Vitamin (MULTIVITAMIN WITH MINERALS) TABS tablet Take 1 tablet by mouth daily with breakfast.   05/09/2024 Morning   Multiple Vitamins-Minerals (PRESERVISION AREDS PO) Take 2 capsules by mouth in the morning.   05/09/2024 Morning   omeprazole (PRILOSEC) 40 MG capsule Take 1 capsule by mouth daily after supper.   05/08/2024   oxybutynin  (DITROPAN -XL) 10 MG 24 hr tablet Take 10 mg by mouth daily.   05/09/2024 Morning   OXYGEN Inhale 3 L into the lungs daily as needed (Shortness of breath, wheezing).   05/09/2024   sertraline  (ZOLOFT ) 100 MG tablet Take 100 mg by mouth daily.   05/09/2024 Morning   spironolactone  (ALDACTONE ) 25 MG tablet Take 25 mg by mouth daily.   05/09/2024 Morning   Teriparatide  560 MCG/2.24ML SOPN Inject 20 mcg into the skin at bedtime.   05/08/2024   torsemide  (DEMADEX ) 20 MG tablet Take 1 tablet (20 mg total) by mouth daily. (Patient taking differently: Take 20 mg by mouth 2 (two) times daily at 10 AM and 5 PM.)   05/09/2024 Morning   TRESIBA  FLEXTOUCH 100 UNIT/ML FlexTouch Pen Inject 40-46 Units into the skin See admin instructions. Inject 40 units into the skin in the morning and 46 units at bedtime   05/09/2024 Morning   WIXELA INHUB 250-50 MCG/ACT AEPB Inhale 2 puffs into the lungs in the morning.   05/09/2024 Morning   XARELTO  15 MG TABS tablet Take 15 mg by mouth daily with supper.   Taking   benzonatate  (TESSALON ) 200 MG capsule Take 1 capsule (200 mg total) by mouth 3 (three) times daily as needed for cough. (Patient not taking: Reported on 05/10/2024) 20 capsule 0 Not Taking   Scheduled:   atorvastatin   80 mg Oral QHS   budesonide   2 mL Nebulization BID   budesonide -formoterol   2 puff Inhalation  BID   digoxin   0.125 mg Oral Daily   docusate sodium   100 mg Oral BID   donepezil   10 mg Oral QHS   doxepin   10-20 mg Oral QHS   feeding supplement  1 Container Oral BID BM   furosemide   20 mg Intravenous Daily   guaiFENesin   1,200 mg  Oral BID   insulin  aspart  0-5 Units Subcutaneous QHS   insulin  aspart  0-6 Units Subcutaneous TID WC   insulin  glargine-yfgn  40 Units Subcutaneous q morning   iron  polysaccharides  150 mg Oral BID   isosorbide  mononitrate  30 mg Oral Daily   memantine   10 mg Oral BID   metoprolol  succinate  100 mg Oral BID   mirabegron  ER  50 mg Oral Daily   montelukast   10 mg Oral QHS   oxybutynin   10 mg Oral QHS   pantoprazole  (PROTONIX ) IV  40 mg Intravenous Q12H   sertraline   100 mg Oral Daily   sodium chloride  flush  3 mL Intravenous Q12H   sodium chloride  flush  3 mL Intravenous Q12H    Assessment: 73 YOM w/ PMH significant for AF on Xarelto  PTA [last known dose 11/27 1659] who needs heparin  therapy 2/2 need for EGD w/ esophageal dilation.  No history noted for stroke, intracranial bleed, or GIB from review of chart. He had an ICD placed 03/01/2024 - possible upgrade this admit   Heparin  level therapeutic at 0.41, on heparin  drip rate 1950 units/hr. CBC stable: Hgb 8.7, plt 300s. No s/sx of bleeding or infusion issues.  Goal of Therapy:  Heparin  level 0.3-0.7 units/ml Monitor platelets by anticoagulation protocol: Yes   Plan:  Continue IV heparin  at 1950 units/hr Daily heparin  level and CBC - plan for heparin  off on 12/5@0300  for procedure Monitor s/s bleeding  F/u transition back to rivaroxaban  20mg  every day after procedures complete  Thank you for allowing pharmacy to participate in this patient's care,  Suzen Sour, PharmD, BCCCP Clinical Pharmacist  Phone: [phone] 05/18/2024 10:38 AM  Please check AMION for all [facility] Pharmacy phone numbers After 10:00 PM, call Main Pharmacy [phone]

## 2024-05-18 NOTE — Progress Notes (Signed)
 PT Cancellation Note  Patient Details Name: Mike Hunter MRN: [id number] DOB: 05/23/1951   Cancelled Treatment:    Reason Eval/Treat Not Completed: Other (comment) (Pt just finished with OT and wanted PT to come back in pm. Will return as able.)   Stephane JULIANNA Bevel 05/18/2024, 10:57 AM Elisabel Hanover M,PT Acute Rehab Services [phone]

## 2024-05-18 NOTE — Progress Notes (Signed)
 Progress Note  Patient Name: Mike Hunter Date of Encounter: 05/18/2024 [facility] Cardiologist: Jerel Balding, MD   Interval Summary    Less SOB, still gets to wheezing with ambulation   Vital Signs Vitals:   05/17/24 2300 05/18/24 0500 05/18/24 0735 05/18/24 0836  BP: 111/72 131/71 97/63 116/72  Pulse: (!) 103 78 71 77  Resp: 16 15 20  (!) 23  Temp: 98.1 F (36.7 C)  97.7 F (36.5 C) 97.9 F (36.6 C)  TempSrc: Oral Oral Oral Oral  SpO2: 100%  100% 98%  Weight:  130.3 kg    Height:  5' 10 (1.778 m)      Intake/Output Summary (Last 24 hours) at 05/18/2024 1100 Last data filed at 05/18/2024 0900 Gross per 24 hour  Intake 2689.5 ml  Output 1300 ml  Net 1389.5 ml      05/18/2024    5:00 AM 05/17/2024    5:22 AM 05/17/2024    5:00 AM  Last 3 Weights  Weight (lbs) 287 lb 4.2 oz 287 lb 14.4 oz 287 lb 14.4 oz  Weight (kg) 130.3 kg 130.591 kg 130.591 kg      Telemetry/ECG/cardiology data  03/04/24: TEE 1. Left ventricular ejection fraction, by estimation, is 35 to 40%. The  left ventricle has moderately decreased function. The left ventricle  demonstrates global hypokinesis. Left ventricular diastolic function could  not be evaluated.   2. Right ventricular systolic function is mildly reduced. The right  ventricular size is normal. The estimated right ventricular systolic  pressure is 35.0 mmHg.   3. Left atrial size was moderately dilated. No left atrial/left atrial  appendage thrombus was detected. The LAA emptying velocity was 23 cm/s.   4. Right atrial size was mildly dilated.   5. The mitral valve is normal in structure. Trivial mitral valve  regurgitation. No evidence of mitral stenosis.   6. Possibly a functionally bicuspid aortic valve. There is a calcified  raphe between the left and noncoronary cusps. The aortic valve is  abnormal. There is mild calcification of the aortic valve. There is mild  thickening of the aortic valve. Aortic  valve  regurgitation is not visualized. Aortic valve  sclerosis/calcification is present, without any evidence of aortic  stenosis.   7. There is Moderate (Grade III) plaque involving the descending aorta.    C.MRI 02/29/24 IMPRESSION: 1. LVEF 35% in the setting of AF RVR. 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 In correlation with lab testing showing no acute infarction this admission, this is less likely a viable territory.   2.  RVEF 31% in the setting of AF RVR.   3.  Bilateral pleural effusions incidentally noted.     02/26/24: TTE 1. Left ventricular ejection fraction, by estimation, is 45 to 50%. The  left ventricle has mildly decreased function. The left ventricle  demonstrates global hypokinesis. The left ventricular internal cavity size  was mildly dilated. Left ventricular  diastolic parameters are indeterminate.   2. Right ventricular systolic function is normal. The right ventricular  size is normal. There is mildly elevated pulmonary artery systolic  pressure. The estimated right ventricular systolic pressure is 37.8 mmHg.   3. Left atrial size was mildly dilated.   4. The mitral valve is normal in structure. Mild mitral valve  regurgitation.   5. The aortic valve is calcified. Aortic valve regurgitation is not  visualized. Mild to moderate aortic valve stenosis. Vmax 2.8 m/s, MG 21  mmHg, AVA 1.6 cm^2, DI 0.45   6. The inferior vena cava is normal in size with greater than 50%  respiratory variability, suggesting right atrial pressure of 3 mmHg.      11/06/23: LHC/PCI DIAGNOSTIC FINDINGS:    1.  Borderline stenosis of the mid LAD.  RFR abnormal, 0.73.  2.  Otherwise nonobstructive CAD.  3.  Aortic stenosis with mean gradient 23 mmHg by pullback.   PCI:    Successful angioplasty and stenting of the LAD with 2 overlapping stents.   The area of borderline stenosis was focal, but there is atherosclerosis  throughout the LAD which  required long segment stenting.  A 2.75 x 48 mm  had a 3.5 x 38 mm Xience drug-eluting stent were used.  This was  aggressively postdilated with noncompliant balloons and IVUS was used for  stent optimization.      TELEMETRY AFib, mostly 80's, no brady, - Personally Reviewed   No new EKGs   Physical Exam   General: Chronically ill appearing, in no acute distress.  Neck: No JVD.  Cardiac: irreg-irreg Resp: CTA b/l, not actively wheezing, moving ai well Ext: No edema.  Neuro: No gross focal deficits.  Psych: Normal affect.      Assessment & Plan  Mike Hunter is a 73 y.o. 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  Found to have likely aspiration pneumonia with fever and leukocytosis. Ongoing aspiration events.    Problem List:  AF/RVR AFL Known for him on Xarelto  out pt > heparin  here for procedures Had some RVR, better rate controlled otherwise the last few days  For now: Continue amiodarone  gtt with plans to transition to oral once EGD/GI done,  Continue dig, toprol   -Will need antibiotic course for any infectious issues, and GI w/u to be complete prior to any device upgrade.   WBC wnl this morning, no fever.  -Ultimately, plan will be to have AVN ablation and upgrade to CRT, hopefully prior to discharge, though discussed this may (likely) be out patient given need to complete GI evaluation/procedures 1st   Hx of Sustained VT w/ICD in place None here or by device interrogation HFrEF  CAD s/p LAD PCI  S/p PCI to LAD May 2025, on Plavix  CP felt to be MSK/reproducible >neg Trop, no acute EKG changes appreciated Is absolutely reproduced/worsened with palpation of his chest wall d/w his attending cardiologist > ok for Plavix  hold  for EGD (5 days requested)    Further with IM/attending, GI teams   Aspiration pneumonia Anemia  H/H tricked down > stable again this AM,  deferred to IM  Remains on O2, c/o SOB Volume negative cumulatively 10,138ml Does not appear vlume OL reports lifelong asthma and chronic lung issues, with multiple recurrent PNAs over the years Were HS O2 and PRN as well at home  8.  Dysphagia Long history of the same Abnormal esophogram >> pending EGD it seems, await GI comments/recs today Planned for EGD (+/- esophageal dilation) Friday      Signed, Genevieve Ritzel Macario Arthur, PA-C

## 2024-05-18 NOTE — Progress Notes (Signed)
 Occupational Therapy Treatment Patient Details Name: Mike Hunter MRN: [id number] DOB: 07-29-1950 Today's Date: 05/18/2024   History of present illness 73 y/o M adm 05/09/24 after fall out of bed with Lt chest pain and ICD shock. Pt with AKI on CKD. Plan for EGD on 12/5 with heart ablation as an outpt after pt gets stronger.  PMHx:  Afib, bipolar d/o, CHF, COPD, BPH, T2DM, HTN, reflux, CAD, ICD, cervical fusion   OT comments  Pt continues to make steady progress toward all set goals. Pt is most limited by SOB with increased activity. Pt on 2L of O2 throughout session and despite SOB stayed at 100% O2 sats.  Pt instructed on pursed lip breathing to assist with SOB and energy conservation techniques during adls at sink.  Pt is using a urinal in standing with supervision and easily transfers to 3:1 commode or to toilet so bedpan is no longer necessary.  Spoke to nursing about these issues.  Will continue to see with goals to get to supervision level with adls.      If plan is discharge home, recommend the following:  A little help with walking and/or transfers;A little help with bathing/dressing/bathroom;Assistance with cooking/housework;Assist for transportation;Help with stairs or ramp for entrance   Equipment Recommendations  None recommended by OT    Recommendations for Other Services      Precautions / Restrictions Precautions Precautions: Fall Recall of Precautions/Restrictions: Intact Precaution/Restrictions Comments: watch HR Restrictions Weight Bearing Restrictions Per Provider Order: No       Mobility Bed Mobility Overal bed mobility: Needs Assistance Bed Mobility: Supine to Sit     Supine to sit: HOB elevated, Contact guard, Used rails     General bed mobility comments: Pt is heavily reliant on bedrails and HOB being elevated.  Need to attempt bed mobilty without use of rails and HOB elevated.    Transfers Overall transfer level: Needs assistance Equipment used:  Rolling walker (2 wheels) Transfers: Sit to/from Stand Sit to Stand: Contact guard assist           General transfer comment: Pt required assist with lines and cues for hand placement but was steady on feet this am.     Balance Overall balance assessment: History of Falls, Needs assistance Sitting-balance support: Feet supported, No upper extremity supported Sitting balance-Leahy Scale: Good     Standing balance support: During functional activity, Reliant on assistive device for balance, Bilateral upper extremity supported Standing balance-Leahy Scale: Fair Standing balance comment: RW in standing                           ADL either performed or assessed with clinical judgement   ADL Overall ADL's : Needs assistance/impaired Eating/Feeding: Independent;Sitting   Grooming: Wash/dry hands;Wash/dry face;Oral care;Applying deodorant;Brushing hair;Modified independent;Sitting Grooming Details (indicate cue type and reason): Pt states he sits at home to do extended grooming and stands if it is something quick. Upper Body Bathing: Set up;Sitting   Lower Body Bathing: Minimal assistance;Sit to/from stand;Cueing for compensatory techniques Lower Body Bathing Details (indicate cue type and reason): pt fatigues quickly with adls. Pt is able to complete them with several rest breaks and cues to used pursed lip breathing.  O2 sats on 2L of O2 at 100% throughout session despited SOB with activity.  Educated pt on taking slow breaths when he gets anxious and thinking about slowing everything down. Reminded him O2 sats at 100% even when so SOB.  Lower Body Dressing: Minimal assistance;Sit to/from stand Lower Body Dressing Details (indicate cue type and reason): Pt able to sit in figure 4 this am to donn socks and shoes. Pt becomes very SOB with LE dressing activity but did not need as much physical assist; just rest breaks. Toilet Transfer: Contact guard assist;Ambulation;Comfort  height toilet;Rolling walker (2 wheels);Grab bars Toilet Transfer Details (indicate cue type and reason): Pt walked to bathroom for toileting. Pt on purewick in bed. Pt mobilizes very well and used urinal in standing with close supervision.  Highly recommend getting rid of purewick and bed pan use. Toileting- Clothing Manipulation and Hygiene: Minimal assistance;Sit to/from stand       Functional mobility during ADLs: Contact guard assist;Rolling walker (2 wheels) General ADL Comments: pt continues to make progress with adls and is at min assist level    Extremity/Trunk Assessment     Lower Extremity Assessment Lower Extremity Assessment: Defer to PT evaluation        Vision   Vision Assessment?: No apparent visual deficits   Perception     Praxis     Communication Communication Communication: No apparent difficulties   Cognition Arousal: Alert Behavior During Therapy: WFL for tasks assessed/performed Cognition: History of cognitive impairments             OT - Cognition Comments: pt at baseline with STM deficits                 Following commands: Intact        Cueing      Exercises      Shoulder Instructions       General Comments Pt making steady progress toward goals. P is tolerating more activity and adls.    Pertinent Vitals/ Pain       Pain Assessment Pain Assessment: Faces Faces Pain Scale: Hurts a little bit Pain Location: bottom Pain Descriptors / Indicators: Sore Pain Intervention(s): Monitored during session, Repositioned  Home Living                                          Prior Functioning/Environment              Frequency  Min 2X/week        Progress Toward Goals  OT Goals(current goals can now be found in the care plan section)  Progress towards OT goals: Progressing toward goals  Acute Rehab OT Goals Patient Stated Goal: to go home at the end of the week OT Goal Formulation: With  patient Time For Goal Achievement: 05/24/24 Potential to Achieve Goals: Good ADL Goals Pt Will Perform Grooming: with supervision;standing Pt Will Perform Lower Body Bathing: with supervision;sit to/from stand Pt Will Perform Lower Body Dressing: with supervision;sit to/from stand Pt Will Perform Tub/Shower Transfer: Shower transfer;with supervision;grab bars;shower seat;rolling walker;ambulating Additional ADL Goal #1: Pt will walk to bathroom w walker and complete all toileting with supervision  Plan      Co-evaluation                 AM-PAC OT 6 Clicks Daily Activity     Outcome Measure   Help from another person eating meals?: None Help from another person taking care of personal grooming?: None Help from another person toileting, which includes using toliet, bedpan, or urinal?: A Little Help from another person bathing (including washing, rinsing, drying)?: A Little Help from  another person to put on and taking off regular upper body clothing?: A Little Help from another person to put on and taking off regular lower body clothing?: A Little 6 Click Score: 20    End of Session Equipment Utilized During Treatment: Rolling walker (2 wheels);Oxygen  OT Visit Diagnosis: Unsteadiness on feet (R26.81);Other abnormalities of gait and mobility (R26.89);Repeated falls (R29.6);Pain Pain - part of body:  (bottom)   Activity Tolerance No increased pain   Patient Left in chair;with call bell/phone within reach;with chair alarm set   Nurse Communication Mobility status        Time: 0920-1005 OT Time Calculation (min): 45 min  Charges: OT General Charges $OT Visit: 1 Visit OT Treatments $Self Care/Home Management : 38-52 mins   Joshua Silvano Dragon 05/18/2024, 10:26 AM

## 2024-05-18 NOTE — Progress Notes (Signed)
 Physical Therapy Treatment Patient Details Name: Mike Hunter MRN: [id number] DOB: 10/09/1950 Today's Date: 05/18/2024   History of Present Illness 73 y/o M adm 05/09/24 after fall out of bed with Lt chest pain and ICD shock. Pt with AKI on CKD. Plan for EGD on 12/5 with heart ablation as an outpt after pt gets stronger.  PMHx:  Afib, bipolar d/o, CHF, COPD, BPH, T2DM, HTN, reflux, CAD, ICD, cervical fusion    PT Comments  Pt progressing well with PT goals. Pt CGA for STS, and able to ambulate total of 100' with 3 seated rest breaks using rollator. SpO2>95% throughout ambulation on RA. Benefits from being given a target to ambulate to. Pt demonstrates good carryover with pursed lip breathing and 1:2 inhale/exhale pattern for improved dyspnea control with activity. States he is motivated to improve mobility, and pt will benefit from [facility] PT upon d/c to improve activity tolerance, endurance, and strength. Acute PT to follow/    If plan is discharge home, recommend the following: A little help with walking and/or transfers;Assist for transportation;Help with stairs or ramp for entrance   Can travel by private vehicle        Equipment Recommendations  None recommended by PT    Recommendations for Other Services       Precautions / Restrictions Precautions Precautions: Fall Recall of Precautions/Restrictions: Intact Precaution/Restrictions Comments: watch HR Restrictions Weight Bearing Restrictions Per Provider Order: No     Mobility  Bed Mobility Overal bed mobility: Needs Assistance Bed Mobility: Sit to Supine       Sit to supine: Contact guard assist   General bed mobility comments: Requiring inc time and use of bedraisl for sit>supine.    Transfers Overall transfer level: Needs assistance Equipment used: Rolling walker (2 wheels) Transfers: Sit to/from Stand Sit to Stand: Contact guard assist           General transfer comment: Assist for line management needed. Pt  slow to rise, CGA for safety. Performed 3-4 STS throughout session as pt needed seated rest break during gait on rollator.    Ambulation/Gait Ambulation/Gait assistance: Contact guard assist Gait Distance (Feet): 20 Feet (Then 40', then 40') Assistive device: Rollator (4 wheels) Gait Pattern/deviations: Step-through pattern, Decreased stride length Gait velocity: Dec Gait velocity interpretation: 1.31 - 2.62 ft/sec, indicative of limited community ambulator   General Gait Details: Demonstrated slightly flexed trunk throughout. Responds well to cues for pursed lip breathing and timed breathing pattern (1:2 inhale exhale pattern). Required intermittent rest breaks, but pt motivated to improve mobility.   Stairs             Wheelchair Mobility     Tilt Bed    Modified Rankin (Stroke Patients Only)       Balance Overall balance assessment: History of Falls, Needs assistance Sitting-balance support: Feet supported, No upper extremity supported Sitting balance-Leahy Scale: Good     Standing balance support: During functional activity, Reliant on assistive device for balance, Bilateral upper extremity supported Standing balance-Leahy Scale: Poor Standing balance comment: RW in standing                            Communication Communication Communication: No apparent difficulties Factors Affecting Communication: Hearing impaired  Cognition Arousal: Alert Behavior During Therapy: WFL for tasks assessed/performed   PT - Cognitive impairments: No apparent impairments  Following commands: Intact      Cueing Cueing Techniques: Verbal cues  Exercises      General Comments General comments (skin integrity, edema, etc.): SpO2 remained above 95% throughout ambulation. Does complain of inc dyspnea, and feeling of leg weakness with inc distance. Left pt on RA, RN notified      Pertinent Vitals/Pain Pain Assessment Pain  Assessment: Faces Faces Pain Scale: No hurt Pain Intervention(s): Monitored during session    Home Living                          Prior Function            PT Goals (current goals can now be found in the care plan section) Acute Rehab PT Goals PT Goal Formulation: With patient Time For Goal Achievement: 05/24/24 Potential to Achieve Goals: Fair Progress towards PT goals: Progressing toward goals    Frequency    Min 2X/week      PT Plan      Co-evaluation              AM-PAC PT 6 Clicks Mobility   Outcome Measure  Help needed turning from your back to your side while in a flat bed without using bedrails?: A Little Help needed moving from lying on your back to sitting on the side of a flat bed without using bedrails?: A Little Help needed moving to and from a bed to a chair (including a wheelchair)?: A Little Help needed standing up from a chair using your arms (e.g., wheelchair or bedside chair)?: A Little Help needed to walk in hospital room?: A Little Help needed climbing 3-5 steps with a railing? : A Lot 6 Click Score: 17    End of Session Equipment Utilized During Treatment: Gait belt;Oxygen Activity Tolerance: Patient tolerated treatment well Patient left: in bed;with call bell/phone within reach;with bed alarm set Nurse Communication: Mobility status PT Visit Diagnosis: Other abnormalities of gait and mobility (R26.89)     Time: 8571-8490 PT Time Calculation (min) (ACUTE ONLY): 41 min  Charges:    $Therapeutic Activity: 38-52 mins PT General Charges $$ ACUTE PT VISIT: 1 Visit                     Quadry Kampa, SPT    Roylee Chaffin 05/18/2024, 4:33 PM

## 2024-05-18 NOTE — Inpatient Diabetes Management (Signed)
 Inpatient Diabetes Program Recommendations  AACE/ADA: New Consensus Statement on Inpatient Glycemic Control (2015)  Target Ranges:  Prepandial:   less than 140 mg/dL      Peak postprandial:   less than 180 mg/dL (1-2 hours)      Critically ill patients:  140 - 180 mg/dL    Latest Reference Range & Units 05/17/24 06:06 05/17/24 11:09 05/17/24 16:12 05/17/24 21:30  Glucose-Capillary 70 - 99 mg/dL 840 (H)  4 units Novolog   184 (H)  1 unit Novolog   40 units Semglee   215 (H)  2 units Novolog   214 (H)  2 units Novolog    (H): Data is abnormally high  Latest Reference Range & Units 05/18/24 05:17 05/18/24 11:03  Glucose-Capillary 70 - 99 mg/dL 788 (H)  2 units Novolog   40 units Semglee   189 (H)  1 unit Novolog    (H): Data is abnormally high   Home DM Meds: Tresiba  40 units in am, 46 units in pm Novolog  0-13 units scale TID Farxiga 10 mg daily   Current Orders: Semglee  40 units daily Novolog  0-6 units TID AC + HS   MD- Please consider:  1. Increase Semglee  slightly to 42 units daily  2. Start low dose Novolog  Meal Coverage: Novolog  2 units TID with meals HOLD if pt NPO HOLD if pt eats <50% meals    --Will follow patient during hospitalization--  Adina Rudolpho Arrow RN, MSN, CDCES Diabetes Coordinator Inpatient Glycemic Control Team Team Pager: [phone] (8a-5p)

## 2024-05-19 ENCOUNTER — Other Ambulatory Visit: Payer: Self-pay

## 2024-05-19 DIAGNOSIS — I429 Cardiomyopathy, unspecified: Secondary | ICD-10-CM

## 2024-05-19 DIAGNOSIS — N179 Acute kidney failure, unspecified: Secondary | ICD-10-CM | POA: Diagnosis not present

## 2024-05-19 DIAGNOSIS — I5022 Chronic systolic (congestive) heart failure: Secondary | ICD-10-CM

## 2024-05-19 DIAGNOSIS — N189 Chronic kidney disease, unspecified: Secondary | ICD-10-CM | POA: Diagnosis not present

## 2024-05-19 DIAGNOSIS — R000 Tachycardia, unspecified: Secondary | ICD-10-CM

## 2024-05-19 DIAGNOSIS — I4819 Other persistent atrial fibrillation: Secondary | ICD-10-CM | POA: Diagnosis not present

## 2024-05-19 LAB — GLUCOSE, CAPILLARY
Glucose-Capillary: 164 mg/dL — ABNORMAL HIGH (ref 70–99)
Glucose-Capillary: 188 mg/dL — ABNORMAL HIGH (ref 70–99)
Glucose-Capillary: 233 mg/dL — ABNORMAL HIGH (ref 70–99)
Glucose-Capillary: 293 mg/dL — ABNORMAL HIGH (ref 70–99)

## 2024-05-19 LAB — BASIC METABOLIC PANEL WITH GFR
Anion gap: 5 (ref 5–15)
BUN: 12 mg/dL (ref 8–23)
CO2: 24 mmol/L (ref 22–32)
Calcium: 8.1 mg/dL — ABNORMAL LOW (ref 8.9–10.3)
Chloride: 111 mmol/L (ref 98–111)
Creatinine, Ser: 1.26 mg/dL — ABNORMAL HIGH (ref 0.61–1.24)
GFR, Estimated: 59 mL/min — ABNORMAL LOW (ref >60)
Glucose, Bld: 168 mg/dL — ABNORMAL HIGH (ref 70–99)
Potassium: 3.7 mmol/L (ref 3.5–5.1)
Sodium: 140 mmol/L (ref 135–145)

## 2024-05-19 LAB — CBC
HCT: 29.4 % — ABNORMAL LOW (ref 39.0–52.0)
Hemoglobin: 8.3 g/dL — ABNORMAL LOW (ref 13.0–17.0)
MCH: 21.2 pg — ABNORMAL LOW (ref 26.0–34.0)
MCHC: 28.2 g/dL — ABNORMAL LOW (ref 30.0–36.0)
MCV: 75.2 fL — ABNORMAL LOW (ref 80.0–100.0)
Platelets: 375 K/uL (ref 150–400)
RBC: 3.91 MIL/uL — ABNORMAL LOW (ref 4.22–5.81)
RDW: 18.2 % — ABNORMAL HIGH (ref 11.5–15.5)
WBC: 9.8 K/uL (ref 4.0–10.5)
nRBC: 0.2 % (ref 0.0–0.2)

## 2024-05-19 LAB — HEPARIN LEVEL (UNFRACTIONATED): Heparin Unfractionated: 0.35 [IU]/mL (ref 0.30–0.70)

## 2024-05-19 MED ORDER — FUROSEMIDE 10 MG/ML IJ SOLN
80.0000 mg | Freq: Once | INTRAMUSCULAR | Status: AC
  Administered 2024-05-19: 80 mg via INTRAVENOUS
  Filled 2024-05-19: qty 8

## 2024-05-19 MED ORDER — FUROSEMIDE 10 MG/ML IJ SOLN
40.0000 mg | Freq: Every day | INTRAMUSCULAR | Status: DC
  Administered 2024-05-20 – 2024-05-21 (×2): 40 mg via INTRAVENOUS
  Filled 2024-05-19 (×2): qty 4

## 2024-05-19 NOTE — Progress Notes (Signed)
 PHARMACY - ANTICOAGULATION CONSULT NOTE  Pharmacy Consult for heparin  (transition from Xarelto ) Indication: atrial fibrillation  Allergies  Allergen Reactions   Buprenorphine Hcl Nausea And Vomiting   Hydromorphone  Other (See Comments)    Mental Status Changes/HALLUCINATIONS   Mirtazapine Other (See Comments)    Nightmares & altered mental status    Codeine Nausea And Vomiting   Morphine  And Codeine Nausea And Vomiting    Patient Measurements: Height: 5' 10 (177.8 cm) Weight: 130.1 kg (286 lb 13.1 oz) IBW/kg (Calculated) : 73 HEPARIN  DW (KG): 103  Vital Signs: Temp: 97.5 F (36.4 C) (12/04 0334) Temp Source: Axillary (12/04 0334) BP: 127/72 (12/04 0832) Pulse Rate: 71 (12/04 0832)  Labs: Recent Labs    05/17/24 0232 05/17/24 0821 05/18/24 0250 05/19/24 0244  HGB 8.6*  --  8.7* 8.3*  HCT 30.2*  --  30.2* 29.4*  PLT 367  --  367 375  HEPARINUNFRC 0.47  --  0.41 0.35  CREATININE  --  1.14 1.22 1.26*    Estimated Creatinine Clearance: 68.6 mL/min (A) (by C-G formula based on SCr of 1.26 mg/dL (H)).   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   05/08/2024   albuterol  (PROVENTIL  HFA;VENTOLIN  HFA) 108 (90 BASE) MCG/ACT inhaler Inhale 2 puffs into the lungs every 4 (four) hours as needed for wheezing or shortness of breath.    05/09/2024 Morning   amiodarone  (PACERONE ) 200 MG tablet Take 1 tablet (200 mg total) by mouth 2 (two) times daily. 60 tablet 3 05/09/2024 Morning   atorvastatin  (LIPITOR ) 80 MG tablet Take 80 mg by mouth at bedtime.   05/08/2024   azithromycin  (ZITHROMAX ) 250 MG tablet Take 250 mg by mouth every Monday, Wednesday, and Friday.   05/09/2024 Morning   budesonide  (PULMICORT ) 0.5 MG/2ML nebulizer solution Take 2 mLs by nebulization 2 (two) times daily.   05/09/2024 Morning   [EXPIRED] clopidogrel  (PLAVIX ) 75 MG tablet Take 75 mg by mouth daily.   05/09/2024 at  9:00 AM   dapagliflozin propanediol (FARXIGA) 10 MG TABS tablet Take 10 mg by mouth daily.   05/09/2024 Morning   donepezil  (ARICEPT ) 10 MG tablet Take 10 mg by mouth at bedtime.   05/08/2024   doxepin  (SINEQUAN ) 10 MG capsule Take 10-20 mg by mouth at bedtime.   05/08/2024   insulin  aspart (NOVOLOG  FLEXPEN) 100 UNIT/ML injection Inject 0-13 Units into the skin See admin instructions. Inject 0-13 units into the skin three times a day with meals, PER SLIDING SCALE   05/09/2024 Morning   iron  polysaccharides (NIFEREX) 150 MG capsule Take 150 mg by mouth 2 (two) times daily.   05/09/2024 Morning   isosorbide  mononitrate (IMDUR ) 30 MG 24 hr tablet Take 30 mg by  mouth daily.   05/09/2024 Morning   latanoprost  (XALATAN ) 0.005 % ophthalmic solution Place 1 drop into both eyes at bedtime.     05/08/2024   lidocaine  4 % Place 1 patch onto the skin daily. Apply to lower back.   Taking   losartan  (COZAAR ) 25 MG tablet Take 1 tablet (25 mg total) by mouth daily. 90 tablet 2 05/09/2024 Morning   memantine  (NAMENDA ) 10 MG tablet Take 10 mg by mouth 2 (two) times daily.   05/09/2024 Morning   metoprolol  succinate (TOPROL -XL) 50 MG 24 hr tablet Take 1 tablet (50 mg total) by mouth in the morning and at bedtime. Take with or immediately following a meal. 180 tablet 3 05/09/2024 at  9:00 AM   mirabegron  ER (MYRBETRIQ ) 50 MG TB24 tablet Take 50 mg by mouth daily.    05/09/2024 Morning   montelukast  (SINGULAIR ) 10 MG tablet Take 10 mg by mouth at bedtime.   05/08/2024   Multiple Vitamin (MULTIVITAMIN WITH MINERALS) TABS tablet Take 1 tablet by mouth daily with breakfast.   05/09/2024 Morning   Multiple Vitamins-Minerals (PRESERVISION AREDS PO) Take 2 capsules by mouth in the morning.   05/09/2024 Morning   omeprazole (PRILOSEC) 40 MG capsule Take 1 capsule by mouth daily after supper.   05/08/2024   oxybutynin  (DITROPAN -XL) 10 MG 24 hr tablet Take 10 mg by mouth daily.   05/09/2024 Morning   OXYGEN Inhale 3 L into the lungs daily as needed (Shortness of breath, wheezing).   05/09/2024   sertraline  (ZOLOFT ) 100 MG tablet Take 100 mg by mouth daily.   05/09/2024 Morning   spironolactone  (ALDACTONE ) 25 MG tablet Take 25 mg by mouth daily.   05/09/2024 Morning   Teriparatide  560 MCG/2.24ML SOPN Inject 20 mcg into the skin at bedtime.   05/08/2024   torsemide  (DEMADEX ) 20 MG tablet Take 1 tablet (20 mg total) by mouth daily. (Patient taking differently: Take 20 mg by mouth 2 (two) times daily at 10 AM and 5 PM.)   05/09/2024 Morning   TRESIBA  FLEXTOUCH 100 UNIT/ML FlexTouch Pen Inject 40-46 Units into the skin See admin instructions. Inject 40 units into the skin in the morning and 46 units at bedtime   05/09/2024 Morning   WIXELA INHUB 250-50 MCG/ACT AEPB Inhale 2 puffs into the lungs in the morning.   05/09/2024 Morning   XARELTO  15 MG TABS tablet Take 15 mg by mouth daily with supper.   Taking   benzonatate  (TESSALON ) 200 MG capsule Take 1 capsule (200 mg total) by mouth 3 (three) times daily as needed for cough. (Patient not taking: Reported on 05/10/2024) 20 capsule 0 Not Taking   Scheduled:   atorvastatin   80 mg Oral QHS   budesonide   2 mL Nebulization BID   digoxin   0.125 mg Oral Daily   docusate sodium   100 mg Oral BID   donepezil   10 mg Oral QHS   doxepin   10-20 mg Oral QHS   feeding supplement  1 Container Oral BID BM   [START ON 05/20/2024]  furosemide   40 mg Intravenous Daily   guaiFENesin   1,200 mg Oral BID   insulin  aspart  0-5 Units Subcutaneous QHS   insulin  aspart  0-6 Units Subcutaneous TID WC   insulin  glargine-yfgn  40 Units Subcutaneous q morning   iron  polysaccharides  150 mg Oral BID   isosorbide  mononitrate  30 mg Oral Daily   memantine   10 mg Oral BID   metoprolol  succinate  100 mg Oral  BID   mirabegron  ER  50 mg Oral Daily   montelukast   10 mg Oral QHS   oxybutynin   10 mg Oral QHS   pantoprazole  (PROTONIX ) IV  40 mg Intravenous Q12H   sertraline   100 mg Oral Daily   sodium chloride  flush  3 mL Intravenous Q12H   sodium chloride  flush  3 mL Intravenous Q12H    Assessment: 73 YOM w/ PMH significant for AF on Xarelto  PTA [last known dose 11/27 1659] who needs heparin  therapy 2/2 need for EGD w/ esophageal dilation.  No history noted for stroke, intracranial bleed, or GIB from review of chart. He had an ICD placed 03/01/2024 - possible upgrade this admit   Heparin  level therapeutic at 0.35, on heparin  drip rate 1950 units/hr. CBC stable: Hgb 8.3, plt 300s. No s/sx of bleeding or infusion issues.  Goal of Therapy:  Heparin  level 0.3-0.7 units/ml Monitor platelets by anticoagulation protocol: Yes   Plan:  Continue IV heparin  at 1950 units/hr Daily heparin  level and CBC - plan for heparin  off on 12/5@0300  for procedure Monitor s/s bleeding  F/u transition back to rivaroxaban  20mg  every day after procedures complete  Thank you for allowing pharmacy to participate in this patient's care,  Harlene Denna Berdine JONETTA ARABELLA, [facility] Clinical Pharmacist  05/19/2024 10:52 AM   [facility] pharmacy phone numbers are listed on [email or web address]

## 2024-05-19 NOTE — H&P (View-Only) (Signed)
 Progress Note  Patient Name: Mike Hunter Date of Encounter: 05/19/2024 [facility] Cardiologist: Jerel Balding, MD   Interval Summary    Less SOB, still gets to wheezing with ambulation   Vital Signs Vitals:   05/18/24 2132 05/18/24 2300 05/19/24 0334 05/19/24 0832  BP:  122/68 127/72 127/72  Pulse: 82 82 71 71  Resp: 18 16 15    Temp:  97.7 F (36.5 C) (!) 97.5 F (36.4 C)   TempSrc:  Axillary Axillary   SpO2: 100% 98% 100%   Weight:      Height:        Intake/Output Summary (Last 24 hours) at 05/19/2024 0857 Last data filed at 05/19/2024 0847 Gross per 24 hour  Intake 1264 ml  Output 1050 ml  Net 214 ml      05/18/2024    5:00 AM 05/17/2024    5:22 AM 05/17/2024    5:00 AM  Last 3 Weights  Weight (lbs) 287 lb 4.2 oz 287 lb 14.4 oz 287 lb 14.4 oz  Weight (kg) 130.3 kg 130.591 kg 130.591 kg      Telemetry/ECG/cardiology data  03/04/24: TEE 1. Left ventricular ejection fraction, by estimation, is 35 to 40%. The  left ventricle has moderately decreased function. The left ventricle  demonstrates global hypokinesis. Left ventricular diastolic function could  not be evaluated.   2. Right ventricular systolic function is mildly reduced. The right  ventricular size is normal. The estimated right ventricular systolic  pressure is 35.0 mmHg.   3. Left atrial size was moderately dilated. No left atrial/left atrial  appendage thrombus was detected. The LAA emptying velocity was 23 cm/s.   4. Right atrial size was mildly dilated.   5. The mitral valve is normal in structure. Trivial mitral valve  regurgitation. No evidence of mitral stenosis.   6. Possibly a functionally bicuspid aortic valve. There is a calcified  raphe between the left and noncoronary cusps. The aortic valve is  abnormal. There is mild calcification of the aortic valve. There is mild  thickening of the aortic valve. Aortic  valve regurgitation is not visualized. Aortic valve   sclerosis/calcification is present, without any evidence of aortic  stenosis.   7. There is Moderate (Grade III) plaque involving the descending aorta.    C.MRI 02/29/24 IMPRESSION: 1. LVEF 35% in the setting of AF RVR. 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 In correlation with lab testing showing no acute infarction this admission, this is less likely a viable territory.   2.  RVEF 31% in the setting of AF RVR.   3.  Bilateral pleural effusions incidentally noted.     02/26/24: TTE 1. Left ventricular ejection fraction, by estimation, is 45 to 50%. The  left ventricle has mildly decreased function. The left ventricle  demonstrates global hypokinesis. The left ventricular internal cavity size  was mildly dilated. Left ventricular  diastolic parameters are indeterminate.   2. Right ventricular systolic function is normal. The right ventricular  size is normal. There is mildly elevated pulmonary artery systolic  pressure. The estimated right ventricular systolic pressure is 37.8 mmHg.   3. Left atrial size was mildly dilated.   4. The mitral valve is normal in structure. Mild mitral valve  regurgitation.   5. The aortic valve is calcified. Aortic valve regurgitation is not  visualized. Mild to moderate aortic valve stenosis. Vmax 2.8 m/s, MG 21  mmHg, AVA 1.6 cm^2, DI 0.45  6. The inferior vena cava is normal in size with greater than 50%  respiratory variability, suggesting right atrial pressure of 3 mmHg.      11/06/23: LHC/PCI DIAGNOSTIC FINDINGS:    1.  Borderline stenosis of the mid LAD.  RFR abnormal, 0.73.  2.  Otherwise nonobstructive CAD.  3.  Aortic stenosis with mean gradient 23 mmHg by pullback.   PCI:    Successful angioplasty and stenting of the LAD with 2 overlapping stents.   The area of borderline stenosis was focal, but there is atherosclerosis  throughout the LAD which required long segment stenting.  A 2.75 x 48 mm   had a 3.5 x 38 mm Xience drug-eluting stent were used.  This was  aggressively postdilated with noncompliant balloons and IVUS was used for  stent optimization.      TELEMETRY AFib, mostly 80's, no brady, - Personally Reviewed   No new EKGs   Physical Exam   Seen/examined by Dr. Almetta this AM General: Chronically ill appearing, in no acute distress.  Disheveled  Neck: No JVD.  Cardiac: irreg-irreg Resp: crackles bases b/l not actively wheezing, moving ai well Ext: No edema.  Neuro: No gross focal deficits.  Psych: Normal affect.      Assessment & Plan  Mike Hunter is a 73 y.o. 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  Found to have likely aspiration pneumonia with fever and leukocytosis. Ongoing aspiration events.      Problem List:   AF/RVR AFL Known for him on Xarelto  out pt > heparin  here for procedures Had some RVR, better rate controlled otherwise the last several days  ----plan > out patient (next week)  AVN ablation and upgrade to CRT Dr. Almetta and I have contacted his RN, EP scheduler to schedule for Wed 12/10. They will contact him with full details, instructions  ----Amiodarone  gtt until EGD/esophageal dilation (if done) then transition to PO amiodarone  dose, 200mg  daily ----Continue dig and metoprolol  current dose ----Resume home Xarelto  post EGD/procedure when afe to do so, timing as per GI team    Dr. Almetta has been bedside EP team will sign off though remain available Please recall if needed    Hx of Sustained VT w/ICD in place None here or by device interrogation HFrEF Waxing/waning diuresis needs/SOB As below And further volume, pulm management will be deferred to IM team   CAD s/p LAD  PCI  S/p PCI to LAD May 2025, on Plavix  CP felt to be MSK/reproducible >neg Trop, no acute EKG changes appreciated Is absolutely reproduced/worsened with palpation of his chest wall d/w his attending cardiologist > ok for Plavix  hold for EGD (5 days requested) ----- resume Plavix  post EGD, as soon as safe, timing as per GI team   Further with IM/attending, GI teams  Aspiration pneumonia Anemia  H/H tricked down > stable again this AM,  deferred to IM  Remains on O2, c/o SOB worse this AM Volume negative cumulatively 5,166ml Dr. Garnette exam this AM, with crackles, IV lasix  ordered for this AM Laying near flat comfortably, speaking in full sentences, does not appear in any distress reports lifelong asthma and chronic lung issues, with multiple recurrent PNAs over the years Were HS O2 and PRN as well  at home  Ongoing management as per IM/attending team   8.  Dysphagia Long history of the same Abnormal esophogram >> pending EGD it seems, await GI comments/recs today Planned for EGD (+/- esophageal dilation) Friday      Signed, Jerzee Jerome Macario Arthur, PA-C

## 2024-05-19 NOTE — Progress Notes (Addendum)
 Progress Note  Patient Name: Mike Hunter Date of Encounter: 05/19/2024 [facility] Cardiologist: Jerel Balding, MD   Interval Summary    Less SOB, still gets to wheezing with ambulation   Vital Signs Vitals:   05/18/24 2132 05/18/24 2300 05/19/24 0334 05/19/24 0832  BP:  122/68 127/72 127/72  Pulse: 82 82 71 71  Resp: 18 16 15    Temp:  97.7 F (36.5 C) (!) 97.5 F (36.4 C)   TempSrc:  Axillary Axillary   SpO2: 100% 98% 100%   Weight:      Height:        Intake/Output Summary (Last 24 hours) at 05/19/2024 0857 Last data filed at 05/19/2024 0847 Gross per 24 hour  Intake 1264 ml  Output 1050 ml  Net 214 ml      05/18/2024    5:00 AM 05/17/2024    5:22 AM 05/17/2024    5:00 AM  Last 3 Weights  Weight (lbs) 287 lb 4.2 oz 287 lb 14.4 oz 287 lb 14.4 oz  Weight (kg) 130.3 kg 130.591 kg 130.591 kg      Telemetry/ECG/cardiology data  03/04/24: TEE 1. Left ventricular ejection fraction, by estimation, is 35 to 40%. The  left ventricle has moderately decreased function. The left ventricle  demonstrates global hypokinesis. Left ventricular diastolic function could  not be evaluated.   2. Right ventricular systolic function is mildly reduced. The right  ventricular size is normal. The estimated right ventricular systolic  pressure is 35.0 mmHg.   3. Left atrial size was moderately dilated. No left atrial/left atrial  appendage thrombus was detected. The LAA emptying velocity was 23 cm/s.   4. Right atrial size was mildly dilated.   5. The mitral valve is normal in structure. Trivial mitral valve  regurgitation. No evidence of mitral stenosis.   6. Possibly a functionally bicuspid aortic valve. There is a calcified  raphe between the left and noncoronary cusps. The aortic valve is  abnormal. There is mild calcification of the aortic valve. There is mild  thickening of the aortic valve. Aortic  valve regurgitation is not visualized. Aortic valve   sclerosis/calcification is present, without any evidence of aortic  stenosis.   7. There is Moderate (Grade III) plaque involving the descending aorta.    C.MRI 02/29/24 IMPRESSION: 1. LVEF 35% in the setting of AF RVR. 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 In correlation with lab testing showing no acute infarction this admission, this is less likely a viable territory.   2.  RVEF 31% in the setting of AF RVR.   3.  Bilateral pleural effusions incidentally noted.     02/26/24: TTE 1. Left ventricular ejection fraction, by estimation, is 45 to 50%. The  left ventricle has mildly decreased function. The left ventricle  demonstrates global hypokinesis. The left ventricular internal cavity size  was mildly dilated. Left ventricular  diastolic parameters are indeterminate.   2. Right ventricular systolic function is normal. The right ventricular  size is normal. There is mildly elevated pulmonary artery systolic  pressure. The estimated right ventricular systolic pressure is 37.8 mmHg.   3. Left atrial size was mildly dilated.   4. The mitral valve is normal in structure. Mild mitral valve  regurgitation.   5. The aortic valve is calcified. Aortic valve regurgitation is not  visualized. Mild to moderate aortic valve stenosis. Vmax 2.8 m/s, MG 21  mmHg, AVA 1.6 cm^2, DI 0.45  6. The inferior vena cava is normal in size with greater than 50%  respiratory variability, suggesting right atrial pressure of 3 mmHg.      11/06/23: LHC/PCI DIAGNOSTIC FINDINGS:    1.  Borderline stenosis of the mid LAD.  RFR abnormal, 0.73.  2.  Otherwise nonobstructive CAD.  3.  Aortic stenosis with mean gradient 23 mmHg by pullback.   PCI:    Successful angioplasty and stenting of the LAD with 2 overlapping stents.   The area of borderline stenosis was focal, but there is atherosclerosis  throughout the LAD which required long segment stenting.  A 2.75 x 48 mm   had a 3.5 x 38 mm Xience drug-eluting stent were used.  This was  aggressively postdilated with noncompliant balloons and IVUS was used for  stent optimization.      TELEMETRY AFib, mostly 80's, no brady, - Personally Reviewed   No new EKGs   Physical Exam   Seen/examined by Dr. Almetta this AM General: Chronically ill appearing, in no acute distress.  Disheveled  Neck: No JVD.  Cardiac: irreg-irreg Resp: crackles bases b/l not actively wheezing, moving ai well Ext: No edema.  Neuro: No gross focal deficits.  Psych: Normal affect.      Assessment & Plan  Mike Hunter is a 73 y.o. 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  Found to have likely aspiration pneumonia with fever and leukocytosis. Ongoing aspiration events.      Problem List:   AF/RVR AFL Known for him on Xarelto  out pt > heparin  here for procedures Had some RVR, better rate controlled otherwise the last several days  ----plan > out patient (next week)  AVN ablation and upgrade to CRT Dr. Almetta and I have contacted his RN, EP scheduler to schedule for Wed 12/10. They will contact him with full details, instructions  ----Amiodarone  gtt until EGD/esophageal dilation (if done) then transition to PO amiodarone  dose, 200mg  daily ----Continue dig and metoprolol  current dose ----Resume home Xarelto  post EGD/procedure when afe to do so, timing as per GI team    Dr. Almetta has been bedside EP team will sign off though remain available Please recall if needed    Hx of Sustained VT w/ICD in place None here or by device interrogation HFrEF Waxing/waning diuresis needs/SOB As below And further volume, pulm management will be deferred to IM team   CAD s/p LAD  PCI  S/p PCI to LAD May 2025, on Plavix  CP felt to be MSK/reproducible >neg Trop, no acute EKG changes appreciated Is absolutely reproduced/worsened with palpation of his chest wall d/w his attending cardiologist > ok for Plavix  hold for EGD (5 days requested) ----- resume Plavix  post EGD, as soon as safe, timing as per GI team   Further with IM/attending, GI teams  Aspiration pneumonia Anemia  H/H tricked down > stable again this AM,  deferred to IM  Remains on O2, c/o SOB worse this AM Volume negative cumulatively 5,166ml Dr. Garnette exam this AM, with crackles, IV lasix  ordered for this AM Laying near flat comfortably, speaking in full sentences, does not appear in any distress reports lifelong asthma and chronic lung issues, with multiple recurrent PNAs over the years Were HS O2 and PRN as well  at home  Ongoing management as per IM/attending team   8.  Dysphagia Long history of the same Abnormal esophogram >> pending EGD it seems, await GI comments/recs today Planned for EGD (+/- esophageal dilation) Friday      Signed, Jerzee Jerome Macario Arthur, PA-C

## 2024-05-19 NOTE — Progress Notes (Signed)
 Error

## 2024-05-19 NOTE — Progress Notes (Signed)
 PROGRESS NOTE  Mike Hunter FMW:[id number] DOB: 07/14/1950 DOA: 05/09/2024 PCP: [name], [name], MD   LOS: 10 days   Brief Narrative / Interim history: 73 year old male with history of COPD, PAF with pacemaker on Xarelto , chronic systolic CHF, CKD 3B, C5-7 fusion surgery with hardware, bipolar, OSA on CPAP, morbid obesity, IDDM comes into the hospital with fatigue, falling out of his bed and potentially ICD firing.  There was concern for aspiration pneumonia and he was started on Unasyn .  He developed A-fib with RVR and cardiology was consulted, he was started on IV amiodarone  has been having significant dysphagia and GI was consulted with plans for EGD  Subjective / 24h Interval events: He reports shortness of breath this morning, but he has been having this for a while.  Assesement and Plan: Principal problem Aspiration pneumonia, chronic hypoxic respiratory failure -was concerning for infiltrates at the bases, and with his dysphagia there was clinical concern for aspiration pneumonia.  He also had a fever, cough, leukocytosis.  He was placed on IV Unasyn , continue and complete a 5-day course  Active problems Dysphagia -longstanding issue per patient, has been having EGDs in the past with dilatation in [facility].  On esophagogram.  Never got stuck near the EG junction.  GI consulted, he is now off Plavix  to wait for a washout and is plan for getting an EGD tomorrow  PAF, with RVR -cardiology consulted and following.  He was started on amiodarone  infusion.  He is scheduled to have an AV node ablation next week and upgrade of his pacemaker.  This will happen as an outpatient  AKI on CKD 3b -creatinine is close to his baseline now.  Acute on chronic systolic CHF -EF is 35-40%.  Cardiology following.  Felt to have slightly more fluid overload today, will be given IV Lasix .  Continue rest of his home medications as below  Essential hypertension -continue metoprolol ,  Imdur   Hyperlipidemia-continue statin  COPD with chronic hypoxemia-on 2 L at home, currently at baseline  DM 2, with hyperglycemia-A1c 6.7.  Continue glargine and sliding scale  Obesity, morbid-BMI 41.2.  He would benefit from weight loss  Mild dementia-continue home medications  OSA-continue bedtime CPAP  History of bipolar disorder/depression-continue Zoloft   Hypokalemia-monitor and replenish as indicated  Scheduled Meds:  atorvastatin   80 mg Oral QHS   budesonide   2 mL Nebulization BID   digoxin   0.125 mg Oral Daily   docusate sodium   100 mg Oral BID   donepezil   10 mg Oral QHS   doxepin   10-20 mg Oral QHS   feeding supplement  1 Container Oral BID BM   [START ON 05/20/2024] furosemide   40 mg Intravenous Daily   guaiFENesin   1,200 mg Oral BID   insulin  aspart  0-5 Units Subcutaneous QHS   insulin  aspart  0-6 Units Subcutaneous TID WC   insulin  glargine-yfgn  40 Units Subcutaneous q morning   iron  polysaccharides  150 mg Oral BID   isosorbide  mononitrate  30 mg Oral Daily   memantine   10 mg Oral BID   metoprolol  succinate  100 mg Oral BID   mirabegron  ER  50 mg Oral Daily   montelukast   10 mg Oral QHS   oxybutynin   10 mg Oral QHS   pantoprazole  (PROTONIX ) IV  40 mg Intravenous Q12H   sertraline   100 mg Oral Daily   sodium chloride  flush  3 mL Intravenous Q12H   sodium chloride  flush  3 mL Intravenous Q12H   Continuous  Infusions:  amiodarone  30 mg/hr (05/18/24 2052)   ampicillin -sulbactam (UNASYN ) IV 3 g (05/19/24 0626)   heparin  1,950 Units/hr (05/19/24 0329)   PRN Meds:.acetaminophen  **OR** acetaminophen , levalbuterol , nitroGLYCERIN , ondansetron  (ZOFRAN ) IV, sodium chloride  flush, traMADol   Current Outpatient Medications  Medication Instructions   acetaminophen  (TYLENOL ) 1,300 mg, Every 8 hours PRN   albuterol  (PROVENTIL  HFA;VENTOLIN  HFA) 108 (90 BASE) MCG/ACT inhaler 2 puffs, Every 4 hours PRN   amiodarone  (PACERONE ) 200 mg, Oral, 2 times daily   atorvastatin   (LIPITOR ) 80 mg, Daily at bedtime   azithromycin  (ZITHROMAX ) 250 mg, Every M-W-F   benzonatate  (TESSALON ) 200 mg, Oral, 3 times daily PRN   budesonide  (PULMICORT ) 0.5 MG/2ML nebulizer solution 2 mLs, 2 times daily   dapagliflozin propanediol (FARXIGA) 10 mg, Daily   donepezil  (ARICEPT ) 10 mg, Daily at bedtime   doxepin  (SINEQUAN ) 10-20 mg, Daily at bedtime   insulin  aspart (NOVOLOG  FLEXPEN) 0-13 Units, See admin instructions   iron  polysaccharides (NIFEREX) 150 mg, 2 times daily   isosorbide  mononitrate (IMDUR ) 30 mg, Daily   latanoprost  (XALATAN ) 0.005 % ophthalmic solution 1 drop, Daily at bedtime   lidocaine  4 % 1 patch, Every 24 hours   losartan  (COZAAR ) 25 mg, Oral, Daily   memantine  (NAMENDA ) 10 mg, 2 times daily   metoprolol  succinate (TOPROL -XL) 50 mg, Oral, 2 times daily, Take with or immediately following a meal.   mirabegron  ER (MYRBETRIQ ) 50 mg, Daily   montelukast  (SINGULAIR ) 10 mg, Daily at bedtime   Multiple Vitamin (MULTIVITAMIN WITH MINERALS) TABS tablet 1 tablet, Daily with breakfast   Multiple Vitamins-Minerals (PRESERVISION AREDS PO) 2 capsules, Every morning   omeprazole (PRILOSEC) 40 MG capsule 1 capsule, Daily after supper   oxybutynin  (DITROPAN -XL) 10 mg, Daily   OXYGEN 3 L, Daily PRN   sertraline  (ZOLOFT ) 100 mg, Daily   spironolactone  (ALDACTONE ) 25 mg, Daily   Teriparatide  20 mcg, Daily at bedtime   torsemide  (DEMADEX ) 20 mg, Oral, Daily   Tresiba  FlexTouch 40-46 Units, See admin instructions   WIXELA INHUB 250-50 MCG/ACT AEPB 2 puffs, Every morning   Xarelto  15 mg, Daily with supper    Diet Orders (From admission, onward)     Start     Ordered   05/20/24 0001  Diet NPO time specified Except for: Sips with Meds  Diet effective midnight       Question:  Except for  Answer:  Noralyn with Meds   05/17/24 1335   05/17/24 1334  DIET SOFT Room service appropriate? Yes; Fluid consistency: Thin  Diet effective now       Comments: Very soft  Question Answer  Comment  Room service appropriate? Yes   Fluid consistency: Thin      05/17/24 1333            DVT prophylaxis: SCDs Start: 05/09/24 2239 Place TED hose Start: 05/09/24 2239   Lab Results  Component Value Date   PLT 375 05/19/2024      Code Status: Full Code  Family Communication: No family at bedside  Status is: Inpatient Remains inpatient appropriate because: Severity of illness  Level of care: Telemetry  Consultants:  Gastroenterology Cardiology  Objective: Vitals:   05/18/24 2132 05/18/24 2300 05/19/24 0334 05/19/24 0832  BP:  122/68 127/72 127/72  Pulse: 82 82 71 71  Resp: 18 16 15    Temp:  97.7 F (36.5 C) (!) 97.5 F (36.4 C)   TempSrc:  Axillary Axillary   SpO2: 100% 98% 100%   Weight:  Height:        Intake/Output Summary (Last 24 hours) at 05/19/2024 0959 Last data filed at 05/19/2024 0847 Gross per 24 hour  Intake 1144 ml  Output 1050 ml  Net 94 ml   Wt Readings from Last 3 Encounters:  05/18/24 130.3 kg  04/07/24 125.9 kg  03/22/24 122.2 kg    Examination:  Constitutional: NAD Eyes: no scleral icterus ENMT: Mucous membranes are moist.  Neck: normal, supple Respiratory: Difficult exam due to obesity, but overall clear without wheezing Cardiovascular: Regular rate and rhythm, no murmurs / rubs / gallops.  Trace LE edema.  Abdomen: non distended, no tenderness. Bowel sounds positive.  Musculoskeletal: no clubbing / cyanosis.   Data Reviewed: I have independently reviewed following labs and imaging studies   CBC Recent Labs  Lab 05/15/24 0200 05/16/24 0141 05/17/24 0232 05/18/24 0250 05/19/24 0244  WBC 12.3* 9.6 12.6* 10.3 9.8  HGB 8.2* 8.0* 8.6* 8.7* 8.3*  HCT 27.9* 27.6* 30.2* 30.2* 29.4*  PLT 335 344 367 367 375  MCV 74.4* 74.4* 75.5* 74.0* 75.2*  MCH 21.9* 21.6* 21.5* 21.3* 21.2*  MCHC 29.4* 29.0* 28.5* 28.8* 28.2*  RDW 18.2* 18.0* 18.0* 18.2* 18.2*    Recent Labs  Lab 05/13/24 1015 05/14/24 0221  05/15/24 0200 05/16/24 0141 05/17/24 0821 05/18/24 0250 05/18/24 0347 05/19/24 0244  NA 138   < > 134* 137 139 140  --  140  K 3.9   < > 3.3* 3.6 3.6 3.3*  --  3.7  CL 106   < > 103 104 108 106  --  111  CO2 21*   < > 22 23 22 25   --  24  GLUCOSE 271*   < > 164* 205* 161* 214*  --  168*  BUN 18   < > 14 15 14 12   --  12  CREATININE <0.30*   < > 1.05 1.07 1.14 1.22  --  1.26*  CALCIUM  8.3*   < > 8.1* 7.9* 8.3* 8.1*  --  8.1*  MG 2.1  --   --   --   --   --  1.9  --    < > = values in this interval not displayed.    ------------------------------------------------------------------------------------------------------------------ No results for input(s): CHOL, HDL, LDLCALC, TRIG, CHOLHDL, LDLDIRECT in the last 72 hours.  Lab Results  Component Value Date   HGBA1C 6.7 (H) 01/04/2024   ------------------------------------------------------------------------------------------------------------------ No results for input(s): TSH, T4TOTAL, T3FREE, THYROIDAB in the last 72 hours.  Invalid input(s): FREET3  Cardiac Enzymes No results for input(s): CKMB, TROPONINI, MYOGLOBIN in the last 168 hours.  Invalid input(s): CK ------------------------------------------------------------------------------------------------------------------    Component Value Date/Time   BNP 555.5 (H) 05/11/2024 0955    CBG: Recent Labs  Lab 05/18/24 0517 05/18/24 1103 05/18/24 1632 05/18/24 2111 05/19/24 0620  GLUCAP 211* 189* 203* 206* 164*    Recent Results (from the past 240 hours)  Urine Culture (for pregnant, neutropenic or urologic patients or patients with an indwelling urinary catheter)     Status: None   Collection Time: 05/11/24 10:34 AM   Specimen: Urine, Clean Catch  Result Value Ref Range Status   Specimen Description URINE, CLEAN CATCH  Final   Special Requests NONE  Final   Culture   Final    NO GROWTH Performed at [facility], [address] [place], [place] [address]    Report Status 05/13/2024 FINAL  Final  Culture, blood (Routine X 2) w  Reflex to ID Panel     Status: None   Collection Time: 05/11/24 10:37 AM   Specimen: BLOOD  Result Value Ref Range Status   Specimen Description BLOOD RIGHT ANTECUBITAL  Final   Special Requests   Final    BOTTLES DRAWN AEROBIC ONLY Blood Culture results may not be optimal due to an inadequate volume of blood received in culture bottles   Culture   Final    NO GROWTH 5 DAYS Performed at [facility], [address], [place], [place] [address]    Report Status 05/16/2024 FINAL  Final  Culture, blood (Routine X 2) w Reflex to ID Panel     Status: None   Collection Time: 05/11/24 10:43 AM   Specimen: BLOOD  Result Value Ref Range Status   Specimen Description BLOOD RIGHT ANTECUBITAL  Final   Special Requests   Final    BOTTLES DRAWN AEROBIC AND ANAEROBIC Blood Culture adequate volume   Culture   Final    NO GROWTH 5 DAYS Performed at [facility], [address], [place], [place] [address]    Report Status 05/16/2024 FINAL  Final  MRSA Next Gen by PCR, Nasal     Status: None   Collection Time: 05/11/24  6:47 PM   Specimen: Nasal Mucosa; Nasal Swab  Result Value Ref Range Status   MRSA by PCR Next Gen NOT DETECTED NOT DETECTED Final    Comment: (NOTE) The GeneXpert MRSA Assay (FDA approved for NASAL specimens only), is one component of a comprehensive MRSA colonization surveillance program. It is not intended to diagnose MRSA infection nor to guide or monitor treatment for MRSA infections. Test performance is not FDA approved in patients less than 81 years old. Performed at [facility], [address], [place], [place] [address]   Respiratory (~20 pathogens) panel by PCR     Status: None   Collection Time: 05/12/24  7:26 AM   Specimen: Nasopharyngeal Swab; Respiratory  Result Value Ref Range Status   Adenovirus NOT DETECTED NOT DETECTED Final    Coronavirus 229E NOT DETECTED NOT DETECTED Final    Comment: (NOTE) The Coronavirus on the Respiratory Panel, DOES NOT test for the novel  Coronavirus (2019 nCoV)    Coronavirus HKU1 NOT DETECTED NOT DETECTED Final   Coronavirus NL63 NOT DETECTED NOT DETECTED Final   Coronavirus OC43 NOT DETECTED NOT DETECTED Final   Metapneumovirus NOT DETECTED NOT DETECTED Final   Rhinovirus / Enterovirus NOT DETECTED NOT DETECTED Final   Influenza A NOT DETECTED NOT DETECTED Final   Influenza B NOT DETECTED NOT DETECTED Final   Parainfluenza Virus 1 NOT DETECTED NOT DETECTED Final   Parainfluenza Virus 2 NOT DETECTED NOT DETECTED Final   Parainfluenza Virus 3 NOT DETECTED NOT DETECTED Final   Parainfluenza Virus 4 NOT DETECTED NOT DETECTED Final   Respiratory Syncytial Virus NOT DETECTED NOT DETECTED Final   Bordetella pertussis NOT DETECTED NOT DETECTED Final   Bordetella Parapertussis NOT DETECTED NOT DETECTED Final   Chlamydophila pneumoniae NOT DETECTED NOT DETECTED Final   Mycoplasma pneumoniae NOT DETECTED NOT DETECTED Final    Comment: Performed at [facility], [address], [place], [place] [address]  SARS Coronavirus 2 by RT PCR (hospital order, performed in [facility] hospital lab) *cepheid single result test* Anterior Nasal Swab     Status: None   Collection Time: 05/13/24  3:11 PM   Specimen: Anterior Nasal Swab  Result Value Ref  Range Status   SARS Coronavirus 2 by RT PCR NEGATIVE NEGATIVE Final    Comment: Performed at [facility], [address] [place], [place] [address]     Radiology Studies: No results found.   Nilda Fendt, MD, PhD [facility]  Between 7 am - 7 pm I am available, please contact me via Amion (for emergencies) or Securechat (non urgent messages)  Between 7 pm - 7 am I am not available, please contact night coverage MD/APP via Amion

## 2024-05-19 NOTE — Progress Notes (Signed)
 Physical Therapy Treatment Patient Details Name: Mike Hunter MRN: [id number] DOB: 21-Sep-1950 Today's Date: 05/19/2024   History of Present Illness 73 y/o M adm 05/09/24 after fall out of bed with Lt chest pain and ICD shock. Pt with AKI on CKD. Plan for EGD on 12/5 with heart ablation as an outpt after pt gets stronger.  PMHx:  Afib, bipolar d/o, CHF, COPD, BPH, T2DM, HTN, reflux, CAD, ICD, cervical fusion    PT Comments  Pt demonstrating good progress this session, increasing distance ambulated with AD, and negotiating stairs with no AD and no physical assistance. Pt continues to require seated rest breaks throughout session due to SOB; SpO2 reading 99%. Pt re-educated on importance of pursed lip breathing and activity pacing for energy conservation. Pt would benefit from further gait training and LE strengthening. PT will continue to treat pt while he is admitted. Recommending HHPT at discharge to address remaining mobility deficits and optimize return to PLOF.    If plan is discharge home, recommend the following: A little help with walking and/or transfers;Assist for transportation;Help with stairs or ramp for entrance   Can travel by private vehicle        Equipment Recommendations  None recommended by PT    Recommendations for Other Services       Precautions / Restrictions Precautions Precautions: Fall Recall of Precautions/Restrictions: Intact Precaution/Restrictions Comments: watch HR Restrictions Weight Bearing Restrictions Per Provider Order: No     Mobility  Bed Mobility Overal bed mobility: Needs Assistance Bed Mobility: Supine to Sit, Sit to Supine     Supine to sit: Contact guard, HOB elevated, Used rails Sit to supine: Contact guard assist, HOB elevated, Used rails   General bed mobility comments: Increased time to complete    Transfers Overall transfer level: Needs assistance Equipment used: Rollator (4 wheels) Transfers: Sit to/from Stand Sit to Stand:  Contact guard assist           General transfer comment: Pt completed 5 sit to stands throughout session utilizing rollator and no physical assistance given. Initially pt requiring heavy verbal cueing for sequencing and safety in regards to use of rollator with pt able to complete tasks on his own at the end without prompting.    Ambulation/Gait Ambulation/Gait assistance: Contact guard assist Gait Distance (Feet): 80 Feet (10' then 5' then 25') Assistive device: Rollator (4 wheels) Gait Pattern/deviations: Step-through pattern, Decreased stride length Gait velocity: reduced Gait velocity interpretation: <1.8 ft/sec, indicate of risk for recurrent falls   General Gait Details: Pt ambulated 20' with rollator, then 40', and then 69' with seated rest breaks in between due to pt feeling SOB; SpO2 reading 99% on 4L.   Stairs Stairs: Yes Stairs assistance: Contact guard assist Stair Management: One rail Right, Step to pattern, Sideways Number of Stairs: 3 General stair comments: Pt negotiated steps laterally using R railing to support BUE, demonstrating step to pattern and increased trunk flexion throughout.   Wheelchair Mobility     Tilt Bed    Modified Rankin (Stroke Patients Only)       Balance Overall balance assessment: Needs assistance Sitting-balance support: No upper extremity supported, Feet supported Sitting balance-Leahy Scale: Good Sitting balance - Comments: able to sit EOB w/out UE support   Standing balance support: Bilateral upper extremity supported, During functional activity, Reliant on assistive device for balance Standing balance-Leahy Scale: Poor Standing balance comment: reliant on external support  Communication Communication Communication: Impaired Factors Affecting Communication: Hearing impaired  Cognition Arousal: Alert Behavior During Therapy: WFL for tasks assessed/performed   PT - Cognitive  impairments: No apparent impairments                         Following commands: Intact      Cueing Cueing Techniques: Verbal cues  Exercises      General Comments General comments (skin integrity, edema, etc.): pt on 4L throughout session w/ SpO2 reading 99%, returned to 3.5 L at end of session w/ SpO2 reading 99%      Pertinent Vitals/Pain Pain Assessment Pain Assessment: No/denies pain Pain Score: 0-No pain Pain Intervention(s): Monitored during session    Home Living                          Prior Function            PT Goals (current goals can now be found in the care plan section) Acute Rehab PT Goals Patient Stated Goal: return home, get my ablation next week PT Goal Formulation: With patient Time For Goal Achievement: 05/24/24 Potential to Achieve Goals: Good Progress towards PT goals: Progressing toward goals    Frequency    Min 2X/week      PT Plan      Co-evaluation              AM-PAC PT 6 Clicks Mobility   Outcome Measure  Help needed turning from your back to your side while in a flat bed without using bedrails?: A Little Help needed moving from lying on your back to sitting on the side of a flat bed without using bedrails?: A Little Help needed moving to and from a bed to a chair (including a wheelchair)?: A Little Help needed standing up from a chair using your arms (e.g., wheelchair or bedside chair)?: A Little Help needed to walk in hospital room?: A Little Help needed climbing 3-5 steps with a railing? : A Little 6 Click Score: 18    End of Session Equipment Utilized During Treatment: Gait belt;Oxygen Activity Tolerance: Patient tolerated treatment well Patient left: in bed;with call bell/phone within reach;with bed alarm set Nurse Communication: Mobility status PT Visit Diagnosis: Other abnormalities of gait and mobility (R26.89)     Time: 8993-8950 PT Time Calculation (min) (ACUTE ONLY): 43  min  Charges:    $Gait Training: 23-37 mins $Therapeutic Activity: 8-22 mins PT General Charges $$ ACUTE PT VISIT: 1 Visit                     Mike Hunter Acute Rehab Services [phone] Prefer contact via chat    Mike Hunter Mike Hunter 05/19/2024, 12:19 PM

## 2024-05-20 ENCOUNTER — Inpatient Hospital Stay (HOSPITAL_COMMUNITY): Admitting: Anesthesiology

## 2024-05-20 ENCOUNTER — Encounter (HOSPITAL_COMMUNITY): Admission: EM | Disposition: A | Payer: Self-pay | Source: Home / Self Care | Attending: Internal Medicine

## 2024-05-20 ENCOUNTER — Encounter (HOSPITAL_COMMUNITY): Payer: Self-pay | Admitting: Internal Medicine

## 2024-05-20 DIAGNOSIS — K222 Esophageal obstruction: Secondary | ICD-10-CM | POA: Diagnosis not present

## 2024-05-20 DIAGNOSIS — N189 Chronic kidney disease, unspecified: Secondary | ICD-10-CM | POA: Diagnosis not present

## 2024-05-20 DIAGNOSIS — N179 Acute kidney failure, unspecified: Secondary | ICD-10-CM | POA: Diagnosis not present

## 2024-05-20 HISTORY — PX: ESOPHAGOGASTRODUODENOSCOPY: SHX5428

## 2024-05-20 LAB — GLUCOSE, CAPILLARY
Glucose-Capillary: 244 mg/dL — ABNORMAL HIGH (ref 70–99)
Glucose-Capillary: 217 mg/dL — ABNORMAL HIGH (ref 70–99)
Glucose-Capillary: 274 mg/dL — ABNORMAL HIGH (ref 70–99)
Glucose-Capillary: 278 mg/dL — ABNORMAL HIGH (ref 70–99)

## 2024-05-20 LAB — CBC
HCT: 29 % — ABNORMAL LOW (ref 39.0–52.0)
Hemoglobin: 8.4 g/dL — ABNORMAL LOW (ref 13.0–17.0)
MCH: 21.4 pg — ABNORMAL LOW (ref 26.0–34.0)
MCHC: 29 g/dL — ABNORMAL LOW (ref 30.0–36.0)
MCV: 74 fL — ABNORMAL LOW (ref 80.0–100.0)
Platelets: 370 K/uL (ref 150–400)
RBC: 3.92 MIL/uL — ABNORMAL LOW (ref 4.22–5.81)
RDW: 18.2 % — ABNORMAL HIGH (ref 11.5–15.5)
WBC: 10.9 K/uL — ABNORMAL HIGH (ref 4.0–10.5)
nRBC: 0 % (ref 0.0–0.2)

## 2024-05-20 LAB — COMPREHENSIVE METABOLIC PANEL WITH GFR
ALT: 26 U/L (ref 0–44)
AST: 12 U/L — ABNORMAL LOW (ref 15–41)
Albumin: 2.2 g/dL — ABNORMAL LOW (ref 3.5–5.0)
Alkaline Phosphatase: 81 U/L (ref 38–126)
Anion gap: 9 (ref 5–15)
BUN: 19 mg/dL (ref 8–23)
CO2: 28 mmol/L (ref 22–32)
Calcium: 8.3 mg/dL — ABNORMAL LOW (ref 8.9–10.3)
Chloride: 103 mmol/L (ref 98–111)
Creatinine, Ser: 1.56 mg/dL — ABNORMAL HIGH (ref 0.61–1.24)
GFR, Estimated: 46 mL/min — ABNORMAL LOW (ref >60)
Glucose, Bld: 237 mg/dL — ABNORMAL HIGH (ref 70–99)
Potassium: 3.6 mmol/L (ref 3.5–5.1)
Sodium: 140 mmol/L (ref 135–145)
Total Bilirubin: 0.6 mg/dL (ref 0.0–1.2)
Total Protein: 5.9 g/dL — ABNORMAL LOW (ref 6.5–8.1)

## 2024-05-20 LAB — DIGOXIN LEVEL: Digoxin Level: 0.7 ng/mL — ABNORMAL LOW (ref 0.8–2.0)

## 2024-05-20 LAB — MAGNESIUM: Magnesium: 1.9 mg/dL (ref 1.7–2.4)

## 2024-05-20 LAB — HEPARIN LEVEL (UNFRACTIONATED): Heparin Unfractionated: 0.48 [IU]/mL (ref 0.30–0.70)

## 2024-05-20 SURGERY — EGD (ESOPHAGOGASTRODUODENOSCOPY)
Anesthesia: Monitor Anesthesia Care

## 2024-05-20 MED ORDER — VASOPRESSIN 20 UNIT/ML IV SOLN
INTRAVENOUS | Status: DC | PRN
  Administered 2024-05-20: 1 [IU] via INTRAVENOUS

## 2024-05-20 MED ORDER — RIVAROXABAN 20 MG PO TABS
20.0000 mg | ORAL_TABLET | Freq: Every day | ORAL | Status: DC
  Administered 2024-05-20: 20 mg via ORAL
  Filled 2024-05-20: qty 1

## 2024-05-20 MED ORDER — CLOPIDOGREL BISULFATE 75 MG PO TABS
75.0000 mg | ORAL_TABLET | Freq: Every day | ORAL | Status: DC
  Administered 2024-05-20 – 2024-05-21 (×2): 75 mg via ORAL
  Filled 2024-05-20 (×2): qty 1

## 2024-05-20 MED ORDER — CLOPIDOGREL BISULFATE 75 MG PO TABS: 75.0000 mg | ORAL_TABLET | Freq: Every day | ORAL | Status: DC

## 2024-05-20 MED ORDER — POTASSIUM CHLORIDE CRYS ER 20 MEQ PO TBCR
40.0000 meq | EXTENDED_RELEASE_TABLET | Freq: Once | ORAL | Status: AC
  Administered 2024-05-20: 40 meq via ORAL
  Filled 2024-05-20: qty 2

## 2024-05-20 MED ORDER — PROPOFOL 10 MG/ML IV BOLUS
INTRAVENOUS | Status: DC | PRN
  Administered 2024-05-20: 70 mg via INTRAVENOUS
  Administered 2024-05-20: 40 mg via INTRAVENOUS

## 2024-05-20 MED ORDER — LIDOCAINE 2% (20 MG/ML) 5 ML SYRINGE
INTRAMUSCULAR | Status: DC | PRN
  Administered 2024-05-20: 100 mg via INTRAVENOUS

## 2024-05-20 MED ORDER — PHENYLEPHRINE HCL (PRESSORS) 10 MG/ML IV SOLN
INTRAVENOUS | Status: DC | PRN
  Administered 2024-05-20: 160 ug via INTRAVENOUS

## 2024-05-20 MED ORDER — GLYCOPYRROLATE PF 0.2 MG/ML IJ SOSY
PREFILLED_SYRINGE | INTRAMUSCULAR | Status: DC | PRN
  Administered 2024-05-20: .2 mg via INTRAVENOUS

## 2024-05-20 MED ORDER — SODIUM CHLORIDE 0.9 % IV SOLN: INTRAVENOUS | Status: DC

## 2024-05-20 MED ORDER — SODIUM CHLORIDE 0.9 % IV SOLN: INTRAVENOUS | Status: DC | PRN

## 2024-05-20 MED ORDER — PROPOFOL 500 MG/50ML IV EMUL
INTRAVENOUS | Status: DC | PRN
  Administered 2024-05-20: 100 ug/kg/min via INTRAVENOUS

## 2024-05-20 NOTE — Op Note (Signed)
 [facility] Patient Name: Mike Hunter Procedure Date : 05/20/2024 MRN: [id number] Attending MD: Gustav ALONSO Mcgee , MD, [id number] Date of Birth: 28-Apr-1951 CSN: [id number] Age: 73 Admit Type: Inpatient Procedure:                Upper GI endoscopy Indications:              Dysphagia, Suspected stricture of the esophagus,                            For therapy of esophageal stricture Providers:                Gustav ALONSO Mcgee, MD, Jacquelyn Jaci Pierce,                            RN, Felice Sar, Technician Referring MD:              Medicines:                Monitored Anesthesia Care Complications:            No immediate complications. Estimated Blood Loss:     Estimated blood loss: none. Procedure:                Pre-Anesthesia Assessment:                           - Prior to the procedure, a History and Physical                            was performed, and patient medications and                            allergies were reviewed. The patient's tolerance of                            previous anesthesia was also reviewed. The risks                            and benefits of the procedure and the sedation                            options and risks were discussed with the patient.                            All questions were answered, and informed consent                            was obtained. Prior Anticoagulants: The patient                            last took Plavix  (clopidogrel ) 5 days and Xarelto                             (rivaroxaban ) 2 days prior to the procedure and  last took heparin  on the day of the procedure. ASA                            Grade Assessment: III - A patient with severe                            systemic disease. After reviewing the risks and                            benefits, the patient was deemed in satisfactory                            condition to undergo the procedure.                            After obtaining informed consent, the endoscope was                            passed under direct vision. Throughout the                            procedure, the patient's blood pressure, pulse, and                            oxygen saturations were monitored continuously. The                            [id number]-[id number] ([id number]) Olympus endoscope was introduced                            through the mouth, and advanced to the second part                            of duodenum. The upper GI endoscopy was                            accomplished without difficulty. The patient                            tolerated the procedure well. Scope In: Scope Out: Findings:      The Z-line was regular and was found 40 cm from the incisors.      One benign-appearing, intrinsic mild stenosis was found 39 to 40 cm from       the incisors. This stenosis measured 1.8 cm (inner diameter) x less than       one cm (in length). The stenosis was traversed. A TTS dilator was passed       through the scope. Dilation with an 18-19-20 mm x 8 cm CRE balloon       dilator was performed to 20 mm.      The stomach was normal.      The cardia and gastric fundus were normal on retroflexion.      The examined duodenum was normal. Impression:               -  Z-line regular, 40 cm from the incisors.                           - Benign-appearing esophageal stenosis. Dilated.                           - Normal stomach.                           - Normal examined duodenum.                           - No specimens collected. Recommendation:           - Resume previous diet.                           - Continue present medications.                           - Follow an antireflux regimen.                           - Use Protonix  (pantoprazole ) 40 mg PO daily.                           - Resume heparin  today, Plavix  (clopidogrel ) today                            and Xarelto  (rivaroxaban ) today at prior doses.                             Refer to managing physician for further adjustment                            of therapy.                           - Inpatient GI signing off, please call with any                            questions Procedure Code(s):        --- Professional ---                           [phone], Esophagogastroduodenoscopy, flexible,                            transoral; with transendoscopic balloon dilation of                            esophagus (less than 30 mm diameter) Diagnosis Code(s):        --- Professional ---                           K22.2, Esophageal obstruction  R13.10, Dysphagia, unspecified CPT copyright 2022 [employer]. All rights reserved. The codes documented in this report are preliminary and upon coder review may  be revised to meet current compliance requirements. Bianna Haran V. Tanikka Bresnan, MD 05/20/2024 10:42:59 AM This report has been signed electronically. Number of Addenda: 0

## 2024-05-20 NOTE — Progress Notes (Signed)
 Mobility Specialist Progress Note:    05/20/24 1225  Mobility  Activity Ambulated with assistance;Pivoted/transferred from bed to chair  Level of Assistance Standby assist, set-up cues, supervision of patient - no hands on  Assistive Device Front wheel walker  Distance Ambulated (ft) 15 ft  Range of Motion/Exercises Active  Activity Response Tolerated well  Mobility Referral Yes  Mobility visit 1 Mobility  Mobility Specialist Start Time (ACUTE ONLY) 1225  Mobility Specialist Stop Time (ACUTE ONLY) 1238  Mobility Specialist Time Calculation (min) (ACUTE ONLY) 13 min   Received pt laying in bed agreeable to session. Pt first ambulate to door, then finished to recliner. No c/o any symptoms. Pt able to move, stand, and ambulate w/ no assist. Returned pt to recliner w/ all needs met.   Venetia Keel Mobility Specialist Please Neurosurgeon or Rehab Office at [phone]

## 2024-05-20 NOTE — Progress Notes (Signed)
 PROGRESS NOTE  Mike Hunter FMW:[id number] DOB: [birth year] DOA: 05/09/2024 PCP: Nikki Hansel Atlas, MD   LOS: 11 days   Brief Narrative / Interim history: 73 year old male with history of COPD, PAF with pacemaker on Xarelto , chronic systolic CHF, CKD 3B, C5-7 fusion surgery with hardware, bipolar, OSA on CPAP, morbid obesity, IDDM comes into the hospital with fatigue, falling out of his bed and potentially ICD firing.  There was concern for aspiration pneumonia and he was started on Unasyn .  He developed A-fib with RVR and cardiology was consulted, he was started on IV amiodarone  has been having significant dysphagia and GI was consulted with plans for EGD  Subjective / 24h Interval events: Feels like his breathing is better.  He is awaiting his endoscopy  Assesement and Plan: Principal problem Aspiration pneumonia, chronic hypoxic respiratory failure -was concerning for infiltrates at the bases, and with his dysphagia there was clinical concern for aspiration pneumonia.  He also had a fever, cough, leukocytosis.  He was placed on IV Unasyn , continue and complete a 5-day course  Active problems Dysphagia -longstanding issue per patient, has been having EGDs in the past with dilatation in [facility].  On esophagogram.  Never got stuck near the EG junction.  GI consulted, he is now off Plavix  to wait for a washout and is planning to get an EGD this morning.  Follow results  PAF, with RVR -cardiology consulted and following.  He was started on amiodarone  infusion.  He is scheduled to have an AV node ablation next week and upgrade of his pacemaker.  This will happen as an outpatient  AKI on CKD 3b -creatinine is close to his baseline now.  Acute on chronic systolic CHF -EF is 35-40%.  Cardiology following.  Improved after IV diuresis yesterday, breathing is better.  Essential hypertension -continue metoprolol , Imdur .  Blood pressure is normal  Hyperlipidemia-continue statin  COPD with  chronic hypoxemia-on 2 L at home, currently at baseline  DM 2, with hyperglycemia-A1c 6.7.  Continue glargine and sliding scale  Obesity, morbid-BMI 41.2.  He would benefit from weight loss  Mild dementia-continue home medications  OSA-continue bedtime CPAP  History of bipolar disorder/depression-continue Zoloft   Hypokalemia-monitor and replenish as indicated  Scheduled Meds:  [MAR Hold] atorvastatin   80 mg Oral QHS   [MAR Hold] budesonide   2 mL Nebulization BID   [MAR Hold] digoxin   0.125 mg Oral Daily   [MAR Hold] docusate sodium   100 mg Oral BID   [MAR Hold] donepezil   10 mg Oral QHS   [MAR Hold] doxepin   10-20 mg Oral QHS   [MAR Hold] feeding supplement  1 Container Oral BID BM   [MAR Hold] furosemide   40 mg Intravenous Daily   [MAR Hold] guaiFENesin   1,200 mg Oral BID   [MAR Hold] insulin  aspart  0-5 Units Subcutaneous QHS   [MAR Hold] insulin  aspart  0-6 Units Subcutaneous TID WC   [MAR Hold] insulin  glargine-yfgn  40 Units Subcutaneous q morning   [MAR Hold] iron  polysaccharides  150 mg Oral BID   [MAR Hold] isosorbide  mononitrate  30 mg Oral Daily   [MAR Hold] memantine   10 mg Oral BID   [MAR Hold] metoprolol  succinate  100 mg Oral BID   [MAR Hold] mirabegron  ER  50 mg Oral Daily   [MAR Hold] montelukast   10 mg Oral QHS   [MAR Hold] oxybutynin   10 mg Oral QHS   [MAR Hold] pantoprazole  (PROTONIX ) IV  40 mg Intravenous Q12H   [facility]  Hold] sertraline   100 mg Oral Daily   [MAR Hold] sodium chloride  flush  3 mL Intravenous Q12H   [MAR Hold] sodium chloride  flush  3 mL Intravenous Q12H   Continuous Infusions:  sodium chloride  Stopped (05/20/24 1005)   amiodarone  30 mg/hr (05/20/24 1010)   [MAR Hold] ampicillin -sulbactam (UNASYN ) IV 3 g (05/20/24 0626)   PRN Meds:.[MAR Hold] acetaminophen  **OR** [MAR Hold] acetaminophen , [MAR Hold] levalbuterol , [MAR Hold] nitroGLYCERIN , [MAR Hold] ondansetron  (ZOFRAN ) IV, [MAR Hold] sodium chloride  flush, [MAR Hold] traMADol   Current  Outpatient Medications  Medication Instructions   acetaminophen  (TYLENOL ) 1,300 mg, Every 8 hours PRN   albuterol  (PROVENTIL  HFA;VENTOLIN  HFA) 108 (90 BASE) MCG/ACT inhaler 2 puffs, Every 4 hours PRN   amiodarone  (PACERONE ) 200 mg, Oral, 2 times daily   atorvastatin  (LIPITOR ) 80 mg, Daily at bedtime   azithromycin  (ZITHROMAX ) 250 mg, Every M-W-F   benzonatate  (TESSALON ) 200 mg, Oral, 3 times daily PRN   budesonide  (PULMICORT ) 0.5 MG/2ML nebulizer solution 2 mLs, 2 times daily   dapagliflozin propanediol (FARXIGA) 10 mg, Daily   donepezil  (ARICEPT ) 10 mg, Daily at bedtime   doxepin  (SINEQUAN ) 10-20 mg, Daily at bedtime   insulin  aspart (NOVOLOG  FLEXPEN) 0-13 Units, See admin instructions   iron  polysaccharides (NIFEREX) 150 mg, 2 times daily   isosorbide  mononitrate (IMDUR ) 30 mg, Daily   latanoprost  (XALATAN ) 0.005 % ophthalmic solution 1 drop, Daily at bedtime   lidocaine  4 % 1 patch, Every 24 hours   losartan  (COZAAR ) 25 mg, Oral, Daily   memantine  (NAMENDA ) 10 mg, 2 times daily   metoprolol  succinate (TOPROL -XL) 50 mg, Oral, 2 times daily, Take with or immediately following a meal.   mirabegron  ER (MYRBETRIQ ) 50 mg, Daily   montelukast  (SINGULAIR ) 10 mg, Daily at bedtime   Multiple Vitamin (MULTIVITAMIN WITH MINERALS) TABS tablet 1 tablet, Daily with breakfast   Multiple Vitamins-Minerals (PRESERVISION AREDS PO) 2 capsules, Every morning   omeprazole (PRILOSEC) 40 MG capsule 1 capsule, Daily after supper   oxybutynin  (DITROPAN -XL) 10 mg, Daily   OXYGEN 3 L, Daily PRN   sertraline  (ZOLOFT ) 100 mg, Daily   spironolactone  (ALDACTONE ) 25 mg, Daily   Teriparatide  20 mcg, Daily at bedtime   torsemide  (DEMADEX ) 20 mg, Oral, Daily   Tresiba  FlexTouch 40-46 Units, See admin instructions   WIXELA INHUB 250-50 MCG/ACT AEPB 2 puffs, Every morning   Xarelto  15 mg, Daily with supper    Diet Orders (From admission, onward)     Start     Ordered   05/20/24 1046  DIET SOFT Fluid consistency:  Thin  Diet effective now       Question:  Fluid consistency:  Answer:  Thin   05/20/24 1045            DVT prophylaxis: SCDs Start: 05/09/24 2239 Place TED hose Start: 05/09/24 2239   Lab Results  Component Value Date   PLT 370 05/20/2024      Code Status: Full Code  Family Communication: No family at bedside  Status is: Inpatient Remains inpatient appropriate because: Severity of illness  Level of care: Telemetry  Consultants:  Gastroenterology Cardiology  Objective: Vitals:   05/20/24 1010 05/20/24 1020 05/20/24 1030 05/20/24 1040  BP: 139/80 129/81 117/69 125/87  Pulse: (!) 103 90 91 95  Resp: 17 14 16 16   Temp:      TempSrc:      SpO2: 100% 100% 100% 100%  Weight:      Height:  Intake/Output Summary (Last 24 hours) at 05/20/2024 1047 Last data filed at 05/20/2024 1004 Gross per 24 hour  Intake 2237.87 ml  Output 4125 ml  Net -1887.13 ml   Wt Readings from Last 3 Encounters:  05/20/24 129 kg  04/07/24 125.9 kg  03/22/24 122.2 kg    Examination: Constitutional: NAD Eyes: lids and conjunctivae normal, no scleral icterus ENMT: mmm Neck: normal, supple Respiratory: clear to auscultation bilaterally, no wheezing, no crackles. Normal respiratory effort.  Cardiovascular: Regular rate and rhythm, no murmurs / rubs / gallops. No LE edema. Abdomen: soft, no distention, no tenderness. Bowel sounds positive.   Data Reviewed: I have independently reviewed following labs and imaging studies   CBC Recent Labs  Lab 05/16/24 0141 05/17/24 0232 05/18/24 0250 05/19/24 0244 05/20/24 0238  WBC 9.6 12.6* 10.3 9.8 10.9*  HGB 8.0* 8.6* 8.7* 8.3* 8.4*  HCT 27.6* 30.2* 30.2* 29.4* 29.0*  PLT 344 367 367 375 370  MCV 74.4* 75.5* 74.0* 75.2* 74.0*  MCH 21.6* 21.5* 21.3* 21.2* 21.4*  MCHC 29.0* 28.5* 28.8* 28.2* 29.0*  RDW 18.0* 18.0* 18.2* 18.2* 18.2*    Recent Labs  Lab 05/16/24 0141 05/17/24 0821 05/18/24 0250 05/18/24 0347 05/19/24 0244  05/20/24 0238  NA 137 139 140  --  140 140  K 3.6 3.6 3.3*  --  3.7 3.6  CL 104 108 106  --  111 103  CO2 23 22 25   --  24 28  GLUCOSE 205* 161* 214*  --  168* 237*  BUN 15 14 12   --  12 19  CREATININE 1.07 1.14 1.22  --  1.26* 1.56*  CALCIUM  7.9* 8.3* 8.1*  --  8.1* 8.3*  AST  --   --   --   --   --  12*  ALT  --   --   --   --   --  26  ALKPHOS  --   --   --   --   --  81  BILITOT  --   --   --   --   --  0.6  ALBUMIN   --   --   --   --   --  2.2*  MG  --   --   --  1.9  --  1.9    ------------------------------------------------------------------------------------------------------------------ No results for input(s): CHOL, HDL, LDLCALC, TRIG, CHOLHDL, LDLDIRECT in the last 72 hours.  Lab Results  Component Value Date   HGBA1C 6.7 (H) 01/04/2024   ------------------------------------------------------------------------------------------------------------------ No results for input(s): TSH, T4TOTAL, T3FREE, THYROIDAB in the last 72 hours.  Invalid input(s): FREET3  Cardiac Enzymes No results for input(s): CKMB, TROPONINI, MYOGLOBIN in the last 168 hours.  Invalid input(s): CK ------------------------------------------------------------------------------------------------------------------    Component Value Date/Time   BNP 555.5 (H) 05/11/2024 0955    CBG: Recent Labs  Lab 05/19/24 0620 05/19/24 1138 05/19/24 1630 05/19/24 2123 05/20/24 0608  GLUCAP 164* 188* 233* 293* 244*    Recent Results (from the past 240 hours)  Urine Culture (for pregnant, neutropenic or urologic patients or patients with an indwelling urinary catheter)     Status: None   Collection Time: 05/11/24 10:34 AM   Specimen: Urine, Clean Catch  Result Value Ref Range Status   Specimen Description URINE, CLEAN CATCH  Final   Special Requests NONE  Final   Culture   Final    NO GROWTH Performed at [facility] Lab, [address] [place], [place] [address]  Report Status 05/13/2024 FINAL  Final  Culture, blood (Routine X 2) w Reflex to ID Panel     Status: None   Collection Time: 05/11/24 10:37 AM   Specimen: BLOOD  Result Value Ref Range Status   Specimen Description BLOOD RIGHT ANTECUBITAL  Final   Special Requests   Final    BOTTLES DRAWN AEROBIC ONLY Blood Culture results may not be optimal due to an inadequate volume of blood received in culture bottles   Culture   Final    NO GROWTH 5 DAYS Performed at [facility], [address] [place], [place] [address]    Report Status 05/16/2024 FINAL  Final  Culture, blood (Routine X 2) w Reflex to ID Panel     Status: None   Collection Time: 05/11/24 10:43 AM   Specimen: BLOOD  Result Value Ref Range Status   Specimen Description BLOOD RIGHT ANTECUBITAL  Final   Special Requests   Final    BOTTLES DRAWN AEROBIC AND ANAEROBIC Blood Culture adequate volume   Culture   Final    NO GROWTH 5 DAYS Performed at [facility], [address] [place], [place] [address]    Report Status 05/16/2024 FINAL  Final  MRSA Next Gen by PCR, Nasal     Status: None   Collection Time: 05/11/24  6:47 PM   Specimen: Nasal Mucosa; Nasal Swab  Result Value Ref Range Status   MRSA by PCR Next Gen NOT DETECTED NOT DETECTED Final    Comment: (NOTE) The GeneXpert MRSA Assay (FDA approved for NASAL specimens only), is one component of a comprehensive MRSA colonization surveillance program. It is not intended to diagnose MRSA infection nor to guide or monitor treatment for MRSA infections. Test performance is not FDA approved in patients less than 5 years old. Performed at [facility] Lab, [address], [place], [place] [address]   Respiratory (~20 pathogens) panel by PCR     Status: None   Collection Time: 05/12/24  7:26 AM   Specimen: Nasopharyngeal Swab; Respiratory  Result Value Ref Range Status   Adenovirus NOT DETECTED NOT DETECTED Final   Coronavirus 229E NOT DETECTED NOT DETECTED  Final    Comment: (NOTE) The Coronavirus on the Respiratory Panel, DOES NOT test for the novel  Coronavirus (2019 nCoV)    Coronavirus HKU1 NOT DETECTED NOT DETECTED Final   Coronavirus NL63 NOT DETECTED NOT DETECTED Final   Coronavirus OC43 NOT DETECTED NOT DETECTED Final   Metapneumovirus NOT DETECTED NOT DETECTED Final   Rhinovirus / Enterovirus NOT DETECTED NOT DETECTED Final   Influenza A NOT DETECTED NOT DETECTED Final   Influenza B NOT DETECTED NOT DETECTED Final   Parainfluenza Virus 1 NOT DETECTED NOT DETECTED Final   Parainfluenza Virus 2 NOT DETECTED NOT DETECTED Final   Parainfluenza Virus 3 NOT DETECTED NOT DETECTED Final   Parainfluenza Virus 4 NOT DETECTED NOT DETECTED Final   Respiratory Syncytial Virus NOT DETECTED NOT DETECTED Final   Bordetella pertussis NOT DETECTED NOT DETECTED Final   Bordetella Parapertussis NOT DETECTED NOT DETECTED Final   Chlamydophila pneumoniae NOT DETECTED NOT DETECTED Final   Mycoplasma pneumoniae NOT DETECTED NOT DETECTED Final    Comment: Performed at [facility] Lab, [address], [place], [place] [address]  SARS Coronavirus 2 by RT PCR (hospital order, performed in [facility] hospital lab) *cepheid single result test* Anterior Nasal Swab     Status: None   Collection Time: 05/13/24  3:11 PM   Specimen: Anterior Nasal Swab  Result Value Ref Range Status   SARS Coronavirus 2 by RT PCR NEGATIVE NEGATIVE Final    Comment: Performed at [facility] Lab, [address], [place], [place] [address]     Radiology Studies: No results found.   Nilda Fendt, MD, PhD [facility]  Between 7 am - 7 pm I am available, please contact me via Amion (for emergencies) or Securechat (non urgent messages)  Between 7 pm - 7 am I am not available, please contact night coverage MD/APP via Amion

## 2024-05-20 NOTE — Inpatient Diabetes Management (Signed)
 Inpatient Diabetes Program Recommendations  AACE/ADA: New Consensus Statement on Inpatient Glycemic Control (2015)  Target Ranges:  Prepandial:   less than 140 mg/dL      Peak postprandial:   less than 180 mg/dL (1-2 hours)      Critically ill patients:  140 - 180 mg/dL   Lab Results  Component Value Date   GLUCAP 244 (H) 05/20/2024   HGBA1C 6.7 (H) 01/04/2024    Review of Glycemic Control  Latest Reference Range & Units 05/18/24 21:11 05/19/24 06:20 05/19/24 11:38 05/19/24 16:30 05/19/24 21:23 05/20/24 06:08  Glucose-Capillary 70 - 99 mg/dL 793 (H) 835 (H) 811 (H) 233 (H) 293 (H) 244 (H)  (H): Data is abnormally high Diabetes history: Type 2 DM Outpatient Diabetes medications: Novolog  0-13 units TID, Farxiga 10 mg every day, Tresiba  40 units QD Current orders for Inpatient glycemic control: Semglee  40 units every day, Novolog  0-6 units TID & HS  Inpatient Diabetes Program Recommendations:    Consider increasing Semglee  44 units every day.  Thanks, Tinnie Minus, MSN, RNC-OB Diabetes Coordinator [phone] (8a-5p)

## 2024-05-20 NOTE — Care Management Important Message (Signed)
 Important Message  Patient Details  Name: ARIF AMENDOLA MRN: [id number] Date of Birth: Jan 10, 1951   Important Message Given:  Yes - Medicare IM     Vonzell Arrie Sharps 05/20/2024, 12:44 PM

## 2024-05-20 NOTE — Discharge Instructions (Addendum)
 [facility] [employer]. [facility] [address] [place], [place]  [address] Phone:  [phone]   Fax:  [phone] May 19, 2024     Mike Hunter [address] [place] [address]         Implantable Device Instructions   Mike Hunter   You are scheduled for a CRT-Defibrillator upgrade on Wednesday, December 10 with Dr. Almetta Maris [facility] - Main Entrance A                                                                                                                                                                   [address]                                                                                                                                                                                         [place], [place] [address]   Arrival Time: 8:30 AM   Special note: Every effort is made to have your procedure done on time. Please understand that emergencies sometimes delay  scheduled procedures. ___________________________________________________________________   Before the Procedure:   1) Labs: Complete   2) For your safety, and to allow us  to monitor your vital signs accurately during the Hunter/procedure we request that if you have artificial nails, gel coating, SNS etc. Please have those removed prior to your Hunter/procedure.    3) Medication Instructions:  Do not take any medications or insulin  the morning of your procedure.   You will take your last dose of Farxiga on Saturday, 05/21/2024. You will take your last dose of Xarelto  Sunday 05/22/24    Take only half of your regular dose of insulin  the night before your procedure.   4) Please notify your provider if you are started on any new medications prior to your procedure.  5) The night before your procedure and the morning of your procedure scrub your neck/chest with CHG surgical scrub.  See below for further instructions.    ________________________________________________________________   Day of Procedure:   1) NO eating or drinking after midnight (NPO after 12:00am) except for sips of water with your medications.   2)  Free valet parking service is available. You will check in at ADMITTING. The support person will be asked to wait in the waiting room.  It is OK to have someone drop you off and come back when you are ready to be discharged.      3) Plan to go home the same day, you will only stay overnight if medically necessary.   4) You MUST have a responsible adult to drive you home.   5)  An adult MUST be with you the first 24 hours after you arrive home.   6) Bring a current list of your medications, and the last time and date medication taken.   7) Bring ID and current insurance cards.   8) Please wear clothes that are easy to get on and off and wear slip-on shoes.   _________________________________________________________________   After  Procedure:   1) You will follow up with the Device Clinic 10-14 days after your procedure. You will follow up with Dr Almetta or EP APP 91 days after your procedure.  These appointments will be made for you.   *If you have ANY questions, please call the office at [phone] or send a MyChart message*   _________________________________________________________________     Mike Hunter     Before Hunter, you can play an important role. Because skin is not sterile, your skin needs to be as free of germs as possible. You can reduce the number of germs on your skin by washing with CHG (chlorahexidine gluconate) Soap before Hunter.  CHG is an antiseptic cleaner which kills germs and bonds with the skin to continue killing germs even after washing.   Please do not use if you have an allergy to CHG or antibacterial soaps.  If your skin becomes reddened/irritated stop using the CHG.   Do not shave (including legs and underarms) for at  least 48 hours prior to first CHG shower.  It is OK to shave your face.   Please follow these instructions carefully: 1. Shower the night before Hunter and the morning of Hunter with CHG.   2. If you choose to wash your hair, wash your hair first as usual with your normal shampoo.   3. After you shampoo, rinse your hair and body thoroughly to remove the shampoo.   4. Use CHG as you would any other liquid soap.  You can apply CHG directly to the skin and wash gently with a clean washcloth.   5. Apply the CHG Soap to your body ONLY FROM THE NECK DOWN.  Do not use on open wounds or open sores.  Avoid contact with your eyes, ears, mouth and genitals (private parts).  Wash genitals (private parts) with your normal soap.   6. Wash thoroughly, paying special attention to the area where your Hunter will be performed.   7. Thoroughly rinse your body with warm water from the neck down.    8. DO NOT shower/wash with your normal soap after using and rinsing off the CHG soap.   9. Pat yourself dry with a clean towel.   10. Wear clean pajamas.   11. Place  clean sheets on your bed the night of your first shower and do not sleep with pets.   Day of Hunter: Do not apply any deodorants/lotions.  Please wear clean clothes to the hospital/Hunter center.   [facility] [employer] [facility]. [facility] [address]                       Mike Hunter, [id number]              [facility] [employer]. [facility] [address] [place], [place]  [address] Phone:  [phone]   Fax:  [phone] May 19, 2024     Mike Hunter [address] [place] [address]     Electrophysiology/Ablation Procedure Instructions    Mike Hunter   You are scheduled for a AV Node on Wednesday, December 10 with Dr. Almetta.    [facility] - Main Entrance A   [address]  [place], [place] [address]    Arrival Time: 8:30 AM    Special note: Every effort  is made to have your procedure done on time. Please understand that emergencies sometimes delay scheduled procedures.    _________________________________________________________________   Before the Procedure:     1) Labs: Complete   2) Please notify your provider if you are started on any new medications prior to your procedure.   3) If you take a blood thinner do not miss any doses prior to the day of your procedure.    4) For your safety, and to allow us  to monitor your vital signs accurately during the Hunter/procedure we request that if you have artificial nails, gel coating, SNS etc. Please have those removed prior to your Hunter/procedure.    5) Do not take any medications or insulin  the morning of your procedure.     You will need to hold your Farxiga 3 days prior to the procedure.  Your last dose will be taken on Saturday, 05/21/2024.      The night before your procedure you will only take half of your regular dose of insulin    __________________________________________________________________   Day of Procedure:     1) NO eating or drinking after midnight (NPO after 12:00am).      2) On the morning of your procedure Do NOT take any medication.       3) Free valet parking service is available. You will check in at ADMITTING. The support person will be asked to wait in the waiting room.  It is OK to have someone drop you off and come back when you are ready to be discharged.    4) Plan to go home the same day, you will only stay overnight if medically necessary.    5) You MUST have a responsible adult to drive you home.    6) An adult MUST be with you the first 24 hours after you arrive home.    7) Bring a current list of your medications, and the last time and date medication taken.    8) Bring ID and current insurance cards.    9) Please wear clothes that are easy to get on and off and wear slip-on shoes.      ____________________________________________________________________   After Procedure:   1) It is recommended that you do not drive for 4 days, lift over 5 lbs for 7 days and no  work or strenuous activity for 7 days after your procedure.    2) Follow-up: To be scheduled.

## 2024-05-20 NOTE — Interval H&P Note (Signed)
 History and Physical Interval Note:  05/20/2024 9:34 AM  Mike Hunter  has presented today for surgery, with the diagnosis of Dysphagia, distal esophageal stricture.  The various methods of treatment have been discussed with the patient and family. After consideration of risks, benefits and other options for treatment, the patient has consented to  Procedure(s) with comments: EGD (ESOPHAGOGASTRODUODENOSCOPY) (N/A) - With esophageal dilation as a surgical intervention.  The patient's history has been reviewed, patient examined, no change in status, stable for surgery.  I have reviewed the patient's chart and labs.  Questions were answered to the patient's satisfaction.     [name]

## 2024-05-20 NOTE — Anesthesia Postprocedure Evaluation (Signed)
 Anesthesia Post Note  Patient: Mike Hunter  Procedure(s) Performed: EGD (ESOPHAGOGASTRODUODENOSCOPY)     Patient location during evaluation: Endoscopy Anesthesia Type: MAC Level of consciousness: awake and alert Pain management: pain level controlled Vital Signs Assessment: post-procedure vital signs reviewed and stable Respiratory status: spontaneous breathing, nonlabored ventilation, respiratory function stable and patient connected to nasal cannula oxygen Cardiovascular status: stable and blood pressure returned to baseline Postop Assessment: no apparent nausea or vomiting Anesthetic complications: no   There were no known notable events for this encounter.  Last Vitals:  Vitals:   05/20/24 1149 05/20/24 1230  BP:  125/87  Pulse:  95  Resp:  (!) 22  Temp:    SpO2: 100%     Last Pain:  Vitals:   05/20/24 1230  TempSrc: Oral  PainSc:                  Garnette FORBES Skillern

## 2024-05-20 NOTE — Progress Notes (Signed)
 SLP Cancellation Note  Patient Details Name: Mike Hunter MRN: [id number] DOB: [birth year]   Cancelled treatment:       Reason Eval/Treat Not Completed: Patient at procedure or test/unavailable (having EGD today). SLP will f/u at least briefly for ongoing education.    Damien Blumenthal, M.A., CCC-SLP Speech Language Pathology, Acute Rehabilitation Services  Secure Chat preferred [phone]  05/20/2024, 9:17 AM

## 2024-05-20 NOTE — Progress Notes (Addendum)
 Telemetry reviewed: AFib CVR 60's-90's some 100's  OK to transition amio gtt to PO 200mg  daily post EGD  Reviewed with Dr. Almetta Hold Xarelto  2 days ahead of procedure Wed 05/25/24 (last dose Sunday 05/22/24) Ok to continue Plavix  He is scheduled for ICD upgrade to CRT-D and AV node ablation Wed 05/25/24.  Full procedure instructions are in his chart under Letters I have placed  into his AVS  as well and communicated with the patient.  Charlies Arthur, PA-C

## 2024-05-20 NOTE — Transfer of Care (Signed)
 Immediate Anesthesia Transfer of Care Note  Patient: Mike Hunter  Procedure(s) Performed: EGD (ESOPHAGOGASTRODUODENOSCOPY)  Patient Location: PACU  Anesthesia Type:MAC  Level of Consciousness: drowsy, patient cooperative, and responds to stimulation  Airway & Oxygen Therapy: Patient Spontanous Breathing and Patient connected to nasal cannula oxygen  Post-op Assessment: Report given to RN and Post -op Vital signs reviewed and stable  Post vital signs: Reviewed and stable  Last Vitals:  Vitals Value Taken Time  BP 119/87 05/20/24 1004  Temp    Pulse 100 05/20/24 10:05  Resp 13 05/20/24 10:05  SpO2 100 % 05/20/24 10:05  Vitals shown include unfiled device data.  Last Pain:  Vitals:   05/20/24 0929  TempSrc: Temporal  PainSc: 0-No pain         Complications: No notable events documented.

## 2024-05-20 NOTE — Addendum Note (Signed)
 Addended by: CASIMIR ALDONA BRAVO on: 05/20/2024 06:07 PM   Modules accepted: Orders

## 2024-05-20 NOTE — Progress Notes (Signed)
 PHARMACY - ANTICOAGULATION CONSULT NOTE  Pharmacy Consult for heparin  (transition from Xarelto ) Indication: atrial fibrillation  Allergies  Allergen Reactions   Buprenorphine Hcl Nausea And Vomiting   Hydromorphone  Other (See Comments)    Mental Status Changes/HALLUCINATIONS   Mirtazapine Other (See Comments)    Nightmares & altered mental status    Codeine Nausea And Vomiting   Morphine  And Codeine Nausea And Vomiting    Patient Measurements: Height: 5' 10 (177.8 cm) Weight: 129 kg (284 lb 6.3 oz) IBW/kg (Calculated) : 73 HEPARIN  DW (KG): 103  Vital Signs: Temp: 97.5 F (36.4 C) (12/05 0425) Temp Source: Axillary (12/05 0425) BP: 129/59 (12/05 0425) Pulse Rate: 74 (12/05 0425)  Labs: Recent Labs    05/18/24 0250 05/19/24 0244 05/20/24 0238  HGB 8.7* 8.3* 8.4*  HCT 30.2* 29.4* 29.0*  PLT 367 375 370  HEPARINUNFRC 0.41 0.35 0.48  CREATININE 1.22 1.26* 1.56*    Estimated Creatinine Clearance: 55.2 mL/min (A) (by C-G formula based on SCr of 1.56 mg/dL (H)).   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   05/08/2024   albuterol  (PROVENTIL  HFA;VENTOLIN  HFA) 108 (90 BASE) MCG/ACT inhaler Inhale 2 puffs into the lungs every 4 (four) hours as needed for wheezing or shortness of breath.   05/09/2024 Morning   amiodarone   (PACERONE ) 200 MG tablet Take 1 tablet (200 mg total) by mouth 2 (two) times daily. 60 tablet 3 05/09/2024 Morning   atorvastatin  (LIPITOR ) 80 MG tablet Take 80 mg by mouth at bedtime.   05/08/2024   azithromycin  (ZITHROMAX ) 250 MG tablet Take 250 mg by mouth every Monday, Wednesday, and Friday.   05/09/2024 Morning   budesonide  (PULMICORT ) 0.5 MG/2ML nebulizer solution Take 2 mLs by nebulization 2 (two) times daily.   05/09/2024 Morning   [EXPIRED] clopidogrel  (PLAVIX ) 75 MG tablet Take 75 mg by mouth daily.   05/09/2024 at  9:00 AM   dapagliflozin propanediol (FARXIGA) 10 MG TABS tablet Take 10 mg by mouth daily.   05/09/2024 Morning   donepezil  (ARICEPT ) 10 MG tablet Take 10 mg by mouth at bedtime.   05/08/2024   doxepin  (SINEQUAN ) 10 MG capsule Take 10-20 mg by mouth at bedtime.   05/08/2024   insulin  aspart (NOVOLOG  FLEXPEN) 100 UNIT/ML injection Inject 0-13 Units into the skin See admin instructions. Inject 0-13 units into the skin three times a day with meals, PER SLIDING SCALE   05/09/2024 Morning   iron  polysaccharides (NIFEREX) 150 MG capsule Take 150 mg by mouth 2 (two) times daily.   05/09/2024 Morning   isosorbide  mononitrate (IMDUR ) 30 MG 24 hr tablet Take 30 mg by mouth daily.   05/09/2024 Morning   latanoprost  (XALATAN ) 0.005 % ophthalmic solution Place 1 drop  into both eyes at bedtime.     05/08/2024   lidocaine  4 % Place 1 patch onto the skin daily. Apply to lower back.   Taking   losartan  (COZAAR ) 25 MG tablet Take 1 tablet (25 mg total) by mouth daily. 90 tablet 2 05/09/2024 Morning   memantine  (NAMENDA ) 10 MG tablet Take 10 mg by mouth 2 (two) times daily.   05/09/2024 Morning   metoprolol  succinate (TOPROL -XL) 50 MG 24 hr tablet Take 1 tablet (50 mg total) by mouth in the morning and at bedtime. Take with or immediately following a meal. 180 tablet 3 05/09/2024 at  9:00 AM   mirabegron  ER (MYRBETRIQ ) 50 MG TB24 tablet Take 50 mg by mouth daily.   05/09/2024 Morning    montelukast  (SINGULAIR ) 10 MG tablet Take 10 mg by mouth at bedtime.   05/08/2024   Multiple Vitamin (MULTIVITAMIN WITH MINERALS) TABS tablet Take 1 tablet by mouth daily with breakfast.   05/09/2024 Morning   Multiple Vitamins-Minerals (PRESERVISION AREDS PO) Take 2 capsules by mouth in the morning.   05/09/2024 Morning   omeprazole (PRILOSEC) 40 MG capsule Take 1 capsule by mouth daily after supper.   05/08/2024   oxybutynin  (DITROPAN -XL) 10 MG 24 hr tablet Take 10 mg by mouth daily.   05/09/2024 Morning   OXYGEN Inhale 3 L into the lungs daily as needed (Shortness of breath, wheezing).   05/09/2024   sertraline  (ZOLOFT ) 100 MG tablet Take 100 mg by mouth daily.   05/09/2024 Morning   spironolactone  (ALDACTONE ) 25 MG tablet Take 25 mg by mouth daily.   05/09/2024 Morning   Teriparatide  560 MCG/2.24ML SOPN Inject 20 mcg into the skin at bedtime.   05/08/2024   torsemide  (DEMADEX ) 20 MG tablet Take 1 tablet (20 mg total) by mouth daily. (Patient taking differently: Take 20 mg by mouth 2 (two) times daily at 10 AM and 5 PM.)   05/09/2024 Morning   TRESIBA  FLEXTOUCH 100 UNIT/ML FlexTouch Pen Inject 40-46 Units into the skin See admin instructions. Inject 40 units into the skin in the morning and 46 units at bedtime   05/09/2024 Morning   WIXELA INHUB 250-50 MCG/ACT AEPB Inhale 2 puffs into the lungs in the morning.   05/09/2024 Morning   XARELTO  15 MG TABS tablet Take 15 mg by mouth daily with supper.   Taking   benzonatate  (TESSALON ) 200 MG capsule Take 1 capsule (200 mg total) by mouth 3 (three) times daily as needed for cough. (Patient not taking: Reported on 05/10/2024) 20 capsule 0 Not Taking   Scheduled:   atorvastatin   80 mg Oral QHS   budesonide   2 mL Nebulization BID   digoxin   0.125 mg Oral Daily   docusate sodium   100 mg Oral BID   donepezil   10 mg Oral QHS   doxepin   10-20 mg Oral QHS   feeding supplement  1 Container Oral BID BM   furosemide   40 mg Intravenous Daily   guaiFENesin    1,200 mg Oral BID   insulin  aspart  0-5 Units Subcutaneous QHS   insulin  aspart  0-6 Units Subcutaneous TID WC   insulin  glargine-yfgn  40 Units Subcutaneous q morning   iron  polysaccharides  150 mg Oral BID   isosorbide  mononitrate  30 mg Oral Daily   memantine   10 mg Oral BID   metoprolol  succinate  100 mg Oral BID   mirabegron  ER  50 mg Oral Daily   montelukast   10 mg Oral QHS  oxybutynin   10 mg Oral QHS   pantoprazole  (PROTONIX ) IV  40 mg Intravenous Q12H   sertraline   100 mg Oral Daily   sodium chloride  flush  3 mL Intravenous Q12H   sodium chloride  flush  3 mL Intravenous Q12H    Assessment: 73 YOM w/ PMH significant for AF on Xarelto  PTA [last known dose 11/27 1659] who needs heparin  therapy 2/2 need for EGD w/ esophageal dilation.  No history noted for stroke, intracranial bleed, or GIB from review of chart. He had an ICD placed 03/01/2024 - possible upgrade this admit   Heparin  level therapeutic at 0.48, on heparin  drip rate 1950 units/hr. CBC stable: Hgb 8.4, plt 370. No s/sx of bleeding or infusion issues.  Goal of Therapy:  Heparin  level 0.3-0.7 units/ml Monitor platelets by anticoagulation protocol: Yes   Plan:  Heparin  was turned off at 3AM this morning in anticipation of procedure.  Will f/u for plans for anticoagulation after procedure. Monitor s/s bleeding  F/u transition back to rivaroxaban  20mg  every day after procedures complete  Thank you for allowing pharmacy to participate in this patient's care,  Harlene Barlow, [employer], [facility] Clinical Pharmacist  05/20/2024 7:36 AM   [facility] pharmacy phone numbers are listed on [email or web address]

## 2024-05-20 NOTE — Anesthesia Preprocedure Evaluation (Addendum)
 Anesthesia Evaluation  Patient identified by MRN, date of birth, ID band Patient awake    Reviewed: Allergy & Precautions, NPO status , Patient's Chart, lab work & pertinent test results, reviewed documented beta blocker date and time   Airway Mallampati: II  TM Distance: >3 FB Neck ROM: Limited    Dental  (+) Dental Advisory Given, Missing   Pulmonary asthma , sleep apnea and Continuous Positive Airway Pressure Ventilation , COPD,  oxygen dependent, former smoker   Pulmonary exam normal breath sounds clear to auscultation       Cardiovascular hypertension, Pt. on medications and Pt. on home beta blockers (-) angina + CAD, + Past MI, + Cardiac Stents, + Peripheral Vascular Disease (L ICA stent) and +CHF  + dysrhythmias Atrial Fibrillation + pacemaker + Cardiac Defibrillator + Valvular Problems/Murmurs AS  Rhythm:Irregular Rate:Abnormal + Systolic murmurs Echo 03/04/24: 1. Left ventricular ejection fraction, by estimation, is 35 to 40%. The  left ventricle has moderately decreased function. The left ventricle  demonstrates global hypokinesis. Left ventricular diastolic function could  not be evaluated.   2. Right ventricular systolic function is mildly reduced. The right  ventricular size is normal. The estimated right ventricular systolic  pressure is 35.0 mmHg.   3. Left atrial size was moderately dilated. No left atrial/left atrial  appendage thrombus was detected. The LAA emptying velocity was 23 cm/s.   4. Right atrial size was mildly dilated.   5. The mitral valve is normal in structure. Trivial mitral valve  regurgitation. No evidence of mitral stenosis.   6. Possibly a functionally bicuspid aortic valve. There is a calcified  raphe between the left and noncoronary cusps. The aortic valve is  abnormal. There is mild calcification of the aortic valve. There is mild  thickening of the aortic valve. Aortic  valve regurgitation  is not visualized. Aortic valve  sclerosis/calcification is present, without any evidence of aortic  stenosis.   7. There is Moderate (Grade III) plaque involving the descending aorta.     Neuro/Psych  PSYCHIATRIC DISORDERS Anxiety Depression Bipolar Disorder  Dementia S/p ACDF CVA    GI/Hepatic Neg liver ROS,GERD  Medicated,,Dysphagia, distal esophageal stricture   Endo/Other  diabetes, Type 2, Insulin  Dependent, Oral Hypoglycemic Agents  Class 3 obesity  Renal/GU Renal InsufficiencyRenal disease     Musculoskeletal  (+) Arthritis ,    Abdominal   Peds  Hematology  (+) Blood dyscrasia (Plavix ; Xarelto ), anemia   Anesthesia Other Findings Day of surgery medications reviewed with the patient.  Reproductive/Obstetrics                              Anesthesia Physical Anesthesia Plan  ASA: 4  Anesthesia Plan: MAC   Post-op Pain Management:    Induction: Intravenous  PONV Risk Score and Plan: 1 and Treatment may vary due to age or medical condition and TIVA  Airway Management Planned: Natural Airway and Simple Face Mask  Additional Equipment:   Intra-op Plan:   Post-operative Plan:   Informed Consent: I have reviewed the patients History and Physical, chart, labs and discussed the procedure including the risks, benefits and alternatives for the proposed anesthesia with the patient or authorized representative who has indicated his/her understanding and acceptance.     Dental advisory given  Plan Discussed with: CRNA and Anesthesiologist  Anesthesia Plan Comments:          Anesthesia Quick Evaluation

## 2024-05-21 ENCOUNTER — Other Ambulatory Visit (HOSPITAL_COMMUNITY): Payer: Self-pay

## 2024-05-21 DIAGNOSIS — N189 Chronic kidney disease, unspecified: Secondary | ICD-10-CM | POA: Diagnosis not present

## 2024-05-21 DIAGNOSIS — N179 Acute kidney failure, unspecified: Secondary | ICD-10-CM | POA: Diagnosis not present

## 2024-05-21 LAB — BASIC METABOLIC PANEL WITH GFR
Anion gap: 8 (ref 5–15)
BUN: 18 mg/dL (ref 8–23)
CO2: 26 mmol/L (ref 22–32)
Calcium: 8.2 mg/dL — ABNORMAL LOW (ref 8.9–10.3)
Chloride: 104 mmol/L (ref 98–111)
Creatinine, Ser: 1.35 mg/dL — ABNORMAL HIGH (ref 0.61–1.24)
GFR, Estimated: 55 mL/min — ABNORMAL LOW (ref >60)
Glucose, Bld: 232 mg/dL — ABNORMAL HIGH (ref 70–99)
Potassium: 3.8 mmol/L (ref 3.5–5.1)
Sodium: 138 mmol/L (ref 135–145)

## 2024-05-21 LAB — GLUCOSE, CAPILLARY: Glucose-Capillary: 210 mg/dL — ABNORMAL HIGH (ref 70–99)

## 2024-05-21 MED ORDER — AMIODARONE HCL 200 MG PO TABS
200.0000 mg | ORAL_TABLET | Freq: Every day | ORAL | Status: DC
  Administered 2024-05-21: 200 mg via ORAL
  Filled 2024-05-21: qty 1

## 2024-05-21 MED ORDER — CLOPIDOGREL BISULFATE 75 MG PO TABS
75.0000 mg | ORAL_TABLET | Freq: Every day | ORAL | 0 refills | Status: AC
  Filled 2024-05-21: qty 30, 30d supply, fill #0

## 2024-05-21 MED ORDER — DIGOXIN 125 MCG PO TABS
0.1250 mg | ORAL_TABLET | Freq: Every day | ORAL | 0 refills | Status: DC
  Filled 2024-05-21: qty 30, 30d supply, fill #0

## 2024-05-21 MED ORDER — RIVAROXABAN 20 MG PO TABS
20.0000 mg | ORAL_TABLET | Freq: Every day | ORAL | 11 refills | Status: DC
  Filled 2024-05-21: qty 30, 30d supply, fill #0

## 2024-05-21 MED ORDER — AMIODARONE HCL 200 MG PO TABS
200.0000 mg | ORAL_TABLET | Freq: Every day | ORAL | 0 refills | Status: DC
  Filled 2024-05-21: qty 30, 30d supply, fill #0

## 2024-05-21 NOTE — Discharge Summary (Signed)
 Physician Discharge Summary  Mike Hunter FMW:[id number] DOB: [birth year] DOA: 05/09/2024  PCP: Nikki Hansel Atlas, MD  Admit date: 05/09/2024 Discharge date: 05/21/2024  Admitted From: home Disposition:  home  Recommendations for Outpatient Follow-up:  Follow up with PCP in 1-2 weeks Follow up with cardiology next Wednesday as scheduled  Home Health: none Equipment/Devices: none  Discharge Condition: stable CODE STATUS: Full code Diet Orders (From admission, onward)     Start     Ordered   05/20/24 1046  DIET SOFT Fluid consistency: Thin  Diet effective now       Question:  Fluid consistency:  Answer:  Thin   05/20/24 1045           Brief Narrative / Interim history: 73 year old male with history of COPD, PAF with pacemaker on Xarelto , chronic systolic CHF, CKD 3B, C5-7 fusion surgery with hardware, bipolar, OSA on CPAP, morbid obesity, IDDM comes into the hospital with fatigue, falling out of his bed and potentially ICD firing.  There was concern for aspiration pneumonia and he was started on Unasyn .  He developed A-fib with RVR and cardiology was consulted, he was started on IV amiodarone  has been having significant dysphagia and GI was consulted  Hospital Course / Discharge diagnoses: Principal problem Principal problem Aspiration pneumonia, chronic hypoxic respiratory failure -was concerning for infiltrates at the bases, and with his dysphagia there was clinical concern for aspiration pneumonia.  He also had a fever, cough, leukocytosis.  He was placed on IV Unasyn , and completed a full course while hospitalized.    Active problems Dysphagia -longstanding issue per patient, has been having EGDs in the past with dilatation in [facility].  On esophagogram.  Never got stuck near the EG junction.  GI consulted, he is status post EGD 12/5 with a benign-appearing stenosis status post balloon dilation.  Has been eating well afterwards, tolerating p.o.'s, will be discharged  home in stable condition  PAF, with RVR -cardiology consulted and following.  He was started on amiodarone  infusion.  He is scheduled to have an AV node ablation next week and upgrade of his pacemaker.  This will happen as an outpatient.  He was instructed to stop his blood thinners before the procedure  AKI on CKD 3b -creatinine is close to his baseline now. Acute on chronic systolic CHF -EF is 35-40%.  Cardiology following.  Improved after IV diuresis, resume home diuretics upon discharge Essential hypertension -continue home regimen Hyperlipidemia-continue statin COPD with chronic hypoxemia-on 2 L at home, currently at baseline DM 2, with hyperglycemia-A1c 6.7.  Continue home regimen Obesity, morbid-BMI 41.2.  He would benefit from weight loss Mild dementia-continue home medications OSA-continue bedtime CPAP History of bipolar disorder/depression-continue Zoloft  Hypokalemia-has been replenished  Sepsis ruled out   Discharge Instructions   Allergies as of 05/21/2024       Reactions   Buprenorphine Hcl Nausea And Vomiting   Hydromorphone  Other (See Comments)   Mental Status Changes/HALLUCINATIONS   Mirtazapine Other (See Comments)   Nightmares & altered mental status   Codeine Nausea And Vomiting   Morphine  And Codeine Nausea And Vomiting        Medication List     TAKE these medications    acetaminophen  650 MG CR tablet Commonly known as: TYLENOL  Take 1,300 mg by mouth every 8 (eight) hours as needed for pain.   albuterol  108 (90 Base) MCG/ACT inhaler Commonly known as: VENTOLIN  HFA Inhale 2 puffs into the lungs every 4 (four) hours as  needed for wheezing or shortness of breath.   amiodarone  200 MG tablet Commonly known as: PACERONE  Take 1 tablet (200 mg total) by mouth daily. What changed: when to take this   atorvastatin  80 MG tablet Commonly known as: LIPITOR  Take 80 mg by mouth at bedtime.   azithromycin  250 MG tablet Commonly known as: ZITHROMAX  Take  250 mg by mouth every Monday, Wednesday, and Friday.   benzonatate  200 MG capsule Commonly known as: TESSALON  Take 1 capsule (200 mg total) by mouth 3 (three) times daily as needed for cough.   budesonide  0.5 MG/2ML nebulizer solution Commonly known as: PULMICORT  Take 2 mLs by nebulization 2 (two) times daily.   clopidogrel  75 MG tablet Commonly known as: PLAVIX  Take 1 tablet (75 mg total) by mouth daily.   digoxin  0.125 MG tablet Commonly known as: LANOXIN  Take 1 tablet (0.125 mg total) by mouth daily. Start taking on: May 22, 2024   donepezil  10 MG tablet Commonly known as: ARICEPT  Take 10 mg by mouth at bedtime.   doxepin  10 MG capsule Commonly known as: SINEQUAN  Take 10-20 mg by mouth at bedtime.   Farxiga 10 MG Tabs tablet Generic drug: dapagliflozin propanediol Take 10 mg by mouth daily.   iron  polysaccharides 150 MG capsule Commonly known as: NIFEREX Take 150 mg by mouth 2 (two) times daily.   isosorbide  mononitrate 30 MG 24 hr tablet Commonly known as: IMDUR  Take 30 mg by mouth daily.   lidocaine  4 % Place 1 patch onto the skin daily. Apply to lower back.   losartan  25 MG tablet Commonly known as: COZAAR  Take 1 tablet (25 mg total) by mouth daily.   memantine  10 MG tablet Commonly known as: NAMENDA  Take 10 mg by mouth 2 (two) times daily.   metoprolol  succinate 50 MG 24 hr tablet Commonly known as: TOPROL -XL Take 1 tablet (50 mg total) by mouth in the morning and at bedtime. Take with or immediately following a meal.   mirabegron  ER 50 MG Tb24 tablet Commonly known as: MYRBETRIQ  Take 50 mg by mouth daily.   montelukast  10 MG tablet Commonly known as: SINGULAIR  Take 10 mg by mouth at bedtime.   multivitamin with minerals Tabs tablet Take 1 tablet by mouth daily with breakfast.   NovoLOG  FlexPen 100 UNIT/ML injection Generic drug: insulin  aspart Inject 0-13 Units into the skin See admin instructions. Inject 0-13 units into the skin three  times a day with meals, PER SLIDING SCALE   omeprazole 40 MG capsule Commonly known as: PRILOSEC Take 1 capsule by mouth daily after supper.   oxybutynin  10 MG 24 hr tablet Commonly known as: DITROPAN -XL Take 10 mg by mouth daily.   OXYGEN Inhale 3 L into the lungs daily as needed (Shortness of breath, wheezing).   PRESERVISION AREDS PO Take 2 capsules by mouth in the morning.   sertraline  100 MG tablet Commonly known as: ZOLOFT  Take 100 mg by mouth daily.   spironolactone  25 MG tablet Commonly known as: ALDACTONE  Take 25 mg by mouth daily.   Teriparatide  560 MCG/2.24ML Sopn Inject 20 mcg into the skin at bedtime.   torsemide  20 MG tablet Commonly known as: DEMADEX  Take 1 tablet (20 mg total) by mouth daily. What changed: when to take this   Tresiba  FlexTouch 100 UNIT/ML FlexTouch Pen Generic drug: insulin  degludec Inject 40-46 Units into the skin See admin instructions. Inject 40 units into the skin in the morning and 46 units at bedtime   Wixela Inhub 250-50  MCG/ACT Aepb Generic drug: fluticasone -salmeterol Inhale 2 puffs into the lungs in the morning.   Xalatan  0.005 % ophthalmic solution Generic drug: latanoprost  Place 1 drop into both eyes at bedtime.   Xarelto  15 MG Tabs tablet Generic drug: Rivaroxaban  Take 15 mg by mouth daily with supper.        Follow-up Information     [facility] Follow up.   Specialty: Home Health Services Why: Home health has been arranged. They will contact you to schedule apt. Contact information: [address] [place] [address] [phone]                 Consultations: Cardiology  GI  Procedures/Studies:  DG ESOPHAGUS W SINGLE CM (SOL OR THIN BA) Result Date: 05/16/2024 CLINICAL DATA:  73 year old male. Endorses dysphagia. Request is for esophagram for further evaluation. EXAM: ESOPHAGUS/BARIUM SWALLOW/TABLET STUDY TECHNIQUE: Single contrast examination was  performed using thin liquid barium. This exam was performed by Delon Beagle NP, and was supervised and interpreted by Dr. Juliene Balder. FLUOROSCOPY: Radiation Exposure Index (as provided by the fluoroscopic device): 21.4 mGy Kerma COMPARISON:  None Available. FINDINGS: Swallowing: Appears normal. No vestibular penetration or aspiration seen. Pharynx: Unremarkable. Esophagus: Normal appearance of the esophagus. Mild tapering near the GE junction. Esophageal motility: Within normal limits. Hiatal Hernia: Not assessed Gastroesophageal reflux: None visualized. Ingested 13mm barium tablet: Got stuck at the GE junction. Did not pass with additional thin liquids. Other: None. IMPRESSION: 1. Barium tablet got stuck near the gastroesophageal junction. Mild tapering in this area. Difficult to exclude an esophageal stricture. Consider further evaluation with endoscopy. Electronically Signed   By: Juliene Balder M.D.   On: 05/16/2024 11:46   DG Swallowing Func-Speech Pathology Result Date: 05/13/2024 Table formatting from the original result was not included. Modified Barium Swallow Study Patient Details Name: MAXON KRESSE MRN: [id number] Date of Birth: September 04, 1950 Today's Date: 05/13/2024 HPI/PMH: HPI: 73 yo male presenting to ED after falling out of bed and ICD firing. Admitted with AKI superimposed on CKD and A-fib with RVR, initially scheduled for an OP ablation 12/1 (cardiology following). CT Chest shows patchy dependent bilateral lower lobe consolidation, favor atelectasis over airspace disease. CXR 11/28 shows new mild RUL opacity, suspicious for pneumonia. Most recent SLP visit July 2025 with functional-appearing oropharyngeal swallow clinically and recommendations to f/u for an esophagram. FEES 06/04/23 also WFL. PMH: COPD, paroxysmal A-fib s/p pacemaker on Xarelto , HF reduced EF 35-40%, CKD 3B, C5-C7 fusion surgery with hardware placement, HLD, dementia, T2DM Clinical Impression: Pt exhibits functional oropharyngeal  swallowing. Trace, transient penetration was observed with thin liquids (PAS 2) but no further penetration/aspiration occurred even when challenged with large volumes. Residue along the BOT, valleculae, and pyriform sinues is overall trace. Coughing was observed on multiple occasions without airway invasion or a significant collection of pharyngeal residue. Retention was not noted during the esophageal sweep when given the 13 mm barium tablet (difficult to fully visualize due to body habitus) but pt immediately endorsed globus sensation and heartburn. Could consider more dedicated esophageal assessment (esophagram ordered per attending MD and GI). Will f/u at least briefly for education but do not anticipate post-acute SLP needs. Factors that may increase risk of adverse event in presence of aspiration [name] & [name] 2021): Factors that may increase risk of adverse event in presence of aspiration [name] & [name] 2021): Poor general health and/or compromised immunity; Limited mobility Recommendations/Plan: Swallowing Evaluation Recommendations Swallowing Evaluation Recommendations Recommendations: PO  diet PO Diet Recommendation: Regular; Thin liquids (Level 0) Liquid Administration via: Cup; Straw Medication Administration: Whole meds with liquid Supervision: Patient able to self-feed; Intermittent supervision/cueing for swallowing strategies Swallowing strategies  : Minimize environmental distractions; Slow rate; Small bites/sips Postural changes: Position pt fully upright for meals; Stay upright 30-60 min after meals Oral care recommendations: Oral care BID (2x/day) Recommended consults: Consider esophageal assessment Treatment Plan Treatment Plan Treatment recommendations: Therapy as outlined in treatment plan below Follow-up recommendations: No SLP follow up Functional status assessment: Patient has had a recent decline in their functional status and demonstrates the ability to make significant  improvements in function in a reasonable and predictable amount of time. Treatment frequency: Min 2x/week Treatment duration: 1 week Interventions: Aspiration precaution training; Patient/family education; Trials of upgraded texture/liquids; Diet toleration management by SLP Recommendations Recommendations for follow up therapy are one component of a multi-disciplinary discharge planning process, led by the attending physician.  Recommendations may be updated based on patient status, additional functional criteria and insurance authorization. Assessment: Orofacial Exam: Orofacial Exam Oral Cavity: Oral Hygiene: WFL Oral Cavity - Dentition: Missing dentition Orofacial Anatomy: WFL Oral Motor/Sensory Function: WFL Anatomy: Anatomy: Suspected cervical osteophytes Boluses Administered: Boluses Administered Boluses Administered: Thin liquids (Level 0); Mildly thick liquids (Level 2, nectar thick); Moderately thick liquids (Level 3, honey thick); Puree; Solid  Oral Impairment Domain: Oral Impairment Domain Lip Closure: No labial escape Tongue control during bolus hold: Cohesive bolus between tongue to palatal seal Bolus preparation/mastication: Timely and efficient chewing and mashing Bolus transport/lingual motion: Brisk tongue motion Oral residue: Complete oral clearance Location of oral residue : N/A Initiation of pharyngeal swallow : Valleculae  Pharyngeal Impairment Domain: Pharyngeal Impairment Domain Soft palate elevation: No bolus between soft palate (SP)/pharyngeal wall (PW) Laryngeal elevation: Complete superior movement of thyroid cartilage with complete approximation of arytenoids to epiglottic petiole Anterior hyoid excursion: Complete anterior movement Epiglottic movement: Complete inversion Laryngeal vestibule closure: Complete, no air/contrast in laryngeal vestibule Pharyngeal stripping wave : Present - complete Pharyngeal contraction (A/P view only): N/A Pharyngoesophageal segment opening: Complete  distension and complete duration, no obstruction of flow Tongue base retraction: Trace column of contrast or air between tongue base and PPW Pharyngeal residue: Trace residue within or on pharyngeal structures Location of pharyngeal residue: Tongue base; Valleculae; Pyriform sinuses  Esophageal Impairment Domain: Esophageal Impairment Domain Esophageal clearance upright position: Complete clearance, esophageal coating Pill: Pill Consistency administered: Thin liquids (Level 0) Thin liquids (Level 0): [facility] Penetration/Aspiration Scale Score: Penetration/Aspiration Scale Score 1.  Material does not enter airway: Mildly thick liquids (Level 2, nectar thick); Moderately thick liquids (Level 3, honey thick); Puree; Solid; Pill 2.  Material enters airway, remains ABOVE vocal cords then ejected out: Thin liquids (Level 0) Compensatory Strategies: Compensatory Strategies Compensatory strategies: No   General Information: Caregiver present: Yes (RN)  Diet Prior to this Study: Regular; Thin liquids (Level 0)   Temperature : Normal   Respiratory Status: Increased WOB   Supplemental O2: Nasal cannula   History of Recent Intubation: No  Behavior/Cognition: Alert; Cooperative Self-Feeding Abilities: Able to self-feed Baseline vocal quality/speech: Normal Volitional Cough: Able to elicit Volitional Swallow: Able to elicit Exam Limitations: No limitations Goal Planning: Prognosis for improved oropharyngeal function: Good Barriers to Reach Goals: Time post onset No data recorded Patient/Family Stated Goal: none stated Consulted and agree with results and recommendations: Patient; Nurse; Physician Pain: Pain Assessment Pain Assessment: No/denies pain End of Session: Start Time:SLP Start Time (ACUTE ONLY): 1309 Stop Time: SLP  Stop Time (ACUTE ONLY): 1329 Time Calculation:SLP Time Calculation (min) (ACUTE ONLY): 20 min Charges: SLP Evaluations $ SLP Speech Visit: 1 Visit SLP Evaluations $MBS Swallow: 1 Procedure SLP visit diagnosis: SLP  Visit Diagnosis: Dysphagia, unspecified (R13.10) Past Medical History: Past Medical History: Diagnosis Date  Asthma   Atrial fibrillation (HCC)   Bipolar affective (HCC)   Cataract   CHF (congestive heart failure) (HCC)   Conversion disorder   COPD (chronic obstructive pulmonary disease) (HCC)   Cornea disorder   Diabetes mellitus   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  High cholesterol   History of methicillin resistant staphylococcus aureus (MRSA)   Hypertension   Reflux   Renal disorder   Sleep apnea  Past Surgical History: Past Surgical History: Procedure Laterality Date  CARDIOVERSION N/A 08/26/2023  Procedure: CARDIOVERSION;  Surgeon: Barbaraann Darryle Ned, MD;  Location: [facility] INVASIVE CV LAB;  Service: Cardiovascular;  Laterality: N/A;  CARDIOVERSION N/A 02/26/2024  Procedure: CARDIOVERSION;  Surgeon: Lonni Slain, MD;  Location: [facility] INVASIVE CV LAB;  Service: Cardiovascular;  Laterality: N/A;  CARDIOVERSION N/A 03/04/2024  Procedure: CARDIOVERSION;  Surgeon: Francyne Headland, MD;  Location: [facility] INVASIVE CV LAB;  Service: Cardiovascular;  Laterality: N/A;  CATARACT EXTRACTION    CERVICAL FUSION    CHOLECYSTECTOMY    ICD IMPLANT N/A 03/01/2024  Procedure: ICD IMPLANT;  Surgeon: Kennyth Chew, MD;  Location: [facility] INVASIVE CV LAB;  Service: Cardiovascular;  Laterality: N/A;  KIDNEY STONE SURGERY    KNEE ARTHROSCOPY    TRANSESOPHAGEAL ECHOCARDIOGRAM (CATH LAB) N/A 03/04/2024  Procedure: TRANSESOPHAGEAL ECHOCARDIOGRAM;  Surgeon: Francyne Headland, MD;  Location: [facility] INVASIVE CV LAB;  Service: Cardiovascular;  Laterality: N/A; Damien Blumenthal, M.A., CCC-SLP Speech Language Pathology, Acute Rehabilitation Services Secure Chat preferred [phone] 05/13/2024, 2:38 PM  DG CHEST PORT 1 VIEW Result Date: 05/13/2024 CLINICAL DATA:  Dyspnea. EXAM: PORTABLE CHEST 1 VIEW COMPARISON:  05/10/2024 FINDINGS: Stable cardiomegaly. AICD again seen. Low lung volumes again noted. Stable pulmonary vascular congestion. New mild  airspace opacity is seen in the right upper lobe, suspicious for pneumonia. IMPRESSION: New mild right upper lobe airspace opacity, suspicious for pneumonia. Electronically Signed   By: Norleen DELENA Kil M.D.   On: 05/13/2024 10:05   DG Chest Port 1 View Result Date: 05/10/2024 EXAM: 1 VIEW(S) XRAY OF THE CHEST 05/10/2024 09:56:00 PM COMPARISON: 05/09/2024 CLINICAL HISTORY: SOB (shortness of breath) FINDINGS: LINES, TUBES AND DEVICES: Cardiac pacemaker. LUNGS AND PLEURA: Mild pulmonary vascular congestion. No edema or consolidation. No focal pulmonary opacity. No pleural effusion. No pneumothorax. HEART AND MEDIASTINUM: Cardiac enlargement. Calcification of the aorta. BONES AND SOFT TISSUES: Postoperative changes in the cervical spine. LIMITATIONS/ARTIFACTS: Shallow inspiration. IMPRESSION: 1. Cardiomegaly with mild pulmonary vascular congestion, without edema or consolidation. Electronically signed by: Elsie Gravely MD 05/10/2024 10:06 PM EST RP Workstation: [id number]   DG Knee 1-2 Views Right Result Date: 05/10/2024 CLINICAL DATA:  Fall.  Right knee pain. EXAM: RIGHT KNEE - 2 VIEW COMPARISON:  None Available. FINDINGS: Prior knee arthroplasty with all 3 components in expected position. No No evidence of acute fracture or dislocation. Small to moderate knee joint effusion is seen. No other bone lesions identified. IMPRESSION: No acute fracture or dislocation. Small to moderate knee joint effusion. Prior knee arthroplasty. Electronically Signed   By: Norleen DELENA Kil M.D.   On: 05/10/2024 10:46   CT CHEST ABDOMEN PELVIS W CONTRAST Result Date: 05/09/2024 CLINICAL DATA:  Blunt trauma EXAM: CT CHEST, ABDOMEN, AND PELVIS WITH CONTRAST TECHNIQUE: Multidetector CT imaging of the  chest, abdomen and pelvis was performed following the standard protocol during bolus administration of intravenous contrast. RADIATION DOSE REDUCTION: This exam was performed according to the departmental dose-optimization program which  includes automated exposure control, adjustment of the mA and/or kV according to patient size and/or use of iterative reconstruction technique. CONTRAST:  75mL OMNIPAQUE  IOHEXOL  350 MG/ML SOLN COMPARISON:  02/25/2024 FINDINGS: CT CHEST FINDINGS Cardiovascular: The heart is unremarkable without pericardial effusion. Dual lead pacer is identified. No evidence of thoracic aortic aneurysm or dissection. 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 Atherosclerosis of the aorta and coronary vasculature. Mediastinum/Nodes: No enlarged mediastinal, hilar, or axillary lymph nodes. Thyroid gland, trachea, and esophagus demonstrate no significant findings. Lungs/Pleura: There is patchy bilateral lower lobe consolidation, favor hypoventilatory changes over airspace disease. No effusion or pneumothorax. The central airways are patent. Musculoskeletal: No acute or destructive bony abnormalities. Chronic T10 compression fracture again noted. Reconstructed images demonstrate no additional findings. CT ABDOMEN PELVIS FINDINGS Hepatobiliary: No focal liver abnormality is seen. Status post cholecystectomy. No biliary dilatation. Pancreas: Unremarkable. No pancreatic ductal dilatation or surrounding inflammatory changes. Spleen: No splenic injury or perisplenic hematoma. Adrenals/Urinary Tract: Kidneys are stable. No evidence of acute renal injury. The adrenals and bladder are unremarkable. Stomach/Bowel: No bowel obstruction or ileus. No bowel wall thickening or inflammatory change. Vascular/Lymphatic: Aortic atherosclerosis. No enlarged abdominal or pelvic lymph nodes. Reproductive: Prostate is unremarkable. Other: No free fluid or free intraperitoneal gas. No abdominal wall hernia. Musculoskeletal: No acute or destructive bony abnormalities. Prominent Schmorl's nodes within the inferior endplate of L2 and superior endplate of L3. Reconstructed images demonstrate no additional findings. IMPRESSION:  1. No acute intrathoracic, intra-abdominal, or intrapelvic trauma. 2. Chronic T10 compression deformity, stable.  No acute fractures. 3. Patchy dependent bilateral lower lobe consolidation, favor atelectasis over airspace disease. 4.  Aortic Atherosclerosis (ICD10-I70.0). Electronically Signed   By: Ozell Daring M.D.   On: 05/09/2024 21:32   CT Cervical Spine Wo Contrast Result Date: 05/09/2024 EXAM: CT CERVICAL SPINE WITHOUT CONTRAST 05/09/2024 94:73:77 PM TECHNIQUE: CT of the cervical spine was performed without the administration of intravenous contrast. Multiplanar reformatted images are provided for review. Automated exposure control, iterative reconstruction, and/or weight based adjustment of the mA/kV was utilized to reduce the radiation dose to as low as reasonably achievable. COMPARISON: Comparison is made with 01/30/2023. CLINICAL HISTORY: Neck trauma (Age >= 65y). FINDINGS: CERVICAL SPINE: BONES AND ALIGNMENT: Straightening of the cervical spine. Trace anterior listhesis C4 on C5 and C3 on C4. Anterior fusion hardware from C5 through C7 with solid bone fusion. Facet alignment is maintained. No acute fracture or traumatic malalignment. DEGENERATIVE CHANGES: Multilevel facet degenerative changes with left greater than right foraminal narrowing. SOFT TISSUES: No prevertebral soft tissue swelling. Left carotid stent is noted. IMPRESSION: 1. No acute abnormality of the cervical spine. 2. Stable appearing Anterior fusion hardware from C5 through C7 Electronically signed by: Luke Bun MD 05/09/2024 06:41 PM EST RP Workstation: [id number]   DG Chest 2 View Result Date: 05/09/2024 EXAM: 2 VIEW(S) XRAY OF THE CHEST 05/09/2024 05:35:00 PM COMPARISON: 04/05/2024 CLINICAL HISTORY: CP FINDINGS: LINES, TUBES AND DEVICES: Left subclavian approach dual lead cardiac rhythm maintenance device noted. LUNGS AND PLEURA: Patchy left basilar opacities. No pleural effusion. No pneumothorax. HEART AND MEDIASTINUM:  Cardiomegaly. Aortic arch atherosclerosis. BONES AND SOFT TISSUES: Multilevel thoracic osteophytosis. ACDF hardware noted. Chronic lower thoracic spine compression fracture. IMPRESSION: 1. Patchy left basilar opacities favored to represent atelectasis or scarring . 2.  Cardiomegaly without overt failure Electronically signed by: Luke Bun MD 05/09/2024 06:33 PM EST RP Workstation: [id number]   CT HEAD WO CONTRAST Result Date: 05/09/2024 EXAM: CT HEAD WITHOUT CONTRAST 05/09/2024 05:26:22 PM TECHNIQUE: CT of the head was performed without the administration of intravenous contrast. Automated exposure control, iterative reconstruction, and/or weight based adjustment of the mA/kV was utilized to reduce the radiation dose to as low as reasonably achievable. COMPARISON: 06/20/2023 , head CT [address], MRI 03/03/2023 CLINICAL HISTORY: Head trauma, [place] (Age >= 65y) FINDINGS: BRAIN AND VENTRICLES: No acute hemorrhage. No evidence of acute infarct. No hydrocephalus. No extra-axial collection. No mass effect or midline shift. Cavum septum pellucidum variant noted. Similar-appearing left occipital hyperdense lesion. This measures 9 mm, series to image 14. Atherosclerotic calcifications within cavernous internal carotid and vertebral arteries. ORBITS: No acute abnormality. Bilateral lens replacement noted. SINUSES: No acute abnormality. SOFT TISSUES AND SKULL: No acute soft tissue abnormality. No skull fracture. IMPRESSION: 1. No acute intracranial abnormality. 2. 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 Electronically signed by: Luke Bun MD 05/09/2024 06:32 PM EST RP Workstation: [id number]     Subjective: - no chest pain, shortness of breath, no abdominal pain, nausea or vomiting.   Discharge Exam: BP (!) 128/97 (BP Location: Left Arm)   Pulse 92   Temp 97.6 F (36.4 C) (Axillary)   Resp (!) 21   Ht 5' 10 (1.778 m)   Wt 129 kg   SpO2 91%    BMI 40.81 kg/m   General: Pt is alert, awake, not in acute distress Cardiovascular: RRR, S1/S2 +, no rubs, no gallops Respiratory: CTA bilaterally, no wheezing, no rhonchi Abdominal: Soft, NT, ND, bowel sounds + Extremities: no edema, no cyanosis    The results of significant diagnostics from this hospitalization (including imaging, microbiology, ancillary and laboratory) are listed below for reference.     Microbiology: Recent Results (from the past 240 hours)  Urine Culture (for pregnant, neutropenic or urologic patients or patients with an indwelling urinary catheter)     Status: None   Collection Time: 05/11/24 10:34 AM   Specimen: Urine, Clean Catch  Result Value Ref Range Status   Specimen Description URINE, CLEAN CATCH  Final   Special Requests NONE  Final   Culture   Final    NO GROWTH Performed at [facility], [address] [place], [place] [address]    Report Status 05/13/2024 FINAL  Final  Culture, blood (Routine X 2) w Reflex to ID Panel     Status: None   Collection Time: 05/11/24 10:37 AM   Specimen: BLOOD  Result Value Ref Range Status   Specimen Description BLOOD RIGHT ANTECUBITAL  Final   Special Requests   Final    BOTTLES DRAWN AEROBIC ONLY Blood Culture results may not be optimal due to an inadequate volume of blood received in culture bottles   Culture   Final    NO GROWTH 5 DAYS Performed at [facility], [address] [place], [place] [address]    Report Status 05/16/2024 FINAL  Final  Culture, blood (Routine X 2) w Reflex to ID Panel     Status: None   Collection Time: 05/11/24 10:43 AM   Specimen: BLOOD  Result Value Ref Range Status   Specimen Description BLOOD RIGHT ANTECUBITAL  Final   Special Requests   Final    BOTTLES DRAWN AEROBIC AND ANAEROBIC Blood Culture adequate volume   Culture  Final    NO GROWTH 5 DAYS Performed at [facility], [address], [place], [place] [address]    Report Status 05/16/2024  FINAL  Final  MRSA Next Gen by PCR, Nasal     Status: None   Collection Time: 05/11/24  6:47 PM   Specimen: Nasal Mucosa; Nasal Swab  Result Value Ref Range Status   MRSA by PCR Next Gen NOT DETECTED NOT DETECTED Final    Comment: (NOTE) The GeneXpert MRSA Assay (FDA approved for NASAL specimens only), is one component of a comprehensive MRSA colonization surveillance program. It is not intended to diagnose MRSA infection nor to guide or monitor treatment for MRSA infections. Test performance is not FDA approved in patients less than 51 years old. Performed at [facility], [address], [place], [place] [address]   Respiratory (~20 pathogens) panel by PCR     Status: None   Collection Time: 05/12/24  7:26 AM   Specimen: Nasopharyngeal Swab; Respiratory  Result Value Ref Range Status   Adenovirus NOT DETECTED NOT DETECTED Final   Coronavirus 229E NOT DETECTED NOT DETECTED Final    Comment: (NOTE) The Coronavirus on the Respiratory Panel, DOES NOT test for the novel  Coronavirus (2019 nCoV)    Coronavirus HKU1 NOT DETECTED NOT DETECTED Final   Coronavirus NL63 NOT DETECTED NOT DETECTED Final   Coronavirus OC43 NOT DETECTED NOT DETECTED Final   Metapneumovirus NOT DETECTED NOT DETECTED Final   Rhinovirus / Enterovirus NOT DETECTED NOT DETECTED Final   Influenza A NOT DETECTED NOT DETECTED Final   Influenza B NOT DETECTED NOT DETECTED Final   Parainfluenza Virus 1 NOT DETECTED NOT DETECTED Final   Parainfluenza Virus 2 NOT DETECTED NOT DETECTED Final   Parainfluenza Virus 3 NOT DETECTED NOT DETECTED Final   Parainfluenza Virus 4 NOT DETECTED NOT DETECTED Final   Respiratory Syncytial Virus NOT DETECTED NOT DETECTED Final   Bordetella pertussis NOT DETECTED NOT DETECTED Final   Bordetella Parapertussis NOT DETECTED NOT DETECTED Final   Chlamydophila pneumoniae NOT DETECTED NOT DETECTED Final   Mycoplasma pneumoniae NOT DETECTED NOT DETECTED Final    Comment: Performed at  [facility], [address], [place], [place] [address]  SARS Coronavirus 2 by RT PCR (hospital order, performed in [facility] hospital lab) *cepheid single result test* Anterior Nasal Swab     Status: None   Collection Time: 05/13/24  3:11 PM   Specimen: Anterior Nasal Swab  Result Value Ref Range Status   SARS Coronavirus 2 by RT PCR NEGATIVE NEGATIVE Final    Comment: Performed at [facility], [address], [place], [place] [address]     Labs: Basic Metabolic Panel: Recent Labs  Lab 05/17/24 [phone] 05/18/24 0250 05/18/24 0347 05/19/24 0244 05/20/24 0238 05/21/24 0256  NA 139 140  --  140 140 138  K 3.6 3.3*  --  3.7 3.6 3.8  CL 108 106  --  111 103 104  CO2 22 25  --  24 28 26   GLUCOSE 161* 214*  --  168* 237* 232*  BUN 14 12  --  12 19 18   CREATININE 1.14 1.22  --  1.26* 1.56* 1.35*  CALCIUM  8.3* 8.1*  --  8.1* 8.3* 8.2*  MG  --   --  1.9  --  1.9  --    Liver Function Tests: Recent Labs  Lab 05/20/24 0238  AST 12*  ALT 26  ALKPHOS  81  BILITOT 0.6  PROT 5.9*  ALBUMIN  2.2*   CBC: Recent Labs  Lab 05/16/24 0141 05/17/24 0232 05/18/24 0250 05/19/24 0244 05/20/24 0238  WBC 9.6 12.6* 10.3 9.8 10.9*  HGB 8.0* 8.6* 8.7* 8.3* 8.4*  HCT 27.6* 30.2* 30.2* 29.4* 29.0*  MCV 74.4* 75.5* 74.0* 75.2* 74.0*  PLT 344 367 367 375 370   CBG: Recent Labs  Lab 05/20/24 0608 05/20/24 1305 05/20/24 1633 05/20/24 2145 05/21/24 0609  GLUCAP 244* 217* 274* 278* 210*   Hgb A1c No results for input(s): HGBA1C in the last 72 hours. Lipid Profile No results for input(s): CHOL, HDL, LDLCALC, TRIG, CHOLHDL, LDLDIRECT in the last 72 hours. Thyroid function studies No results for input(s): TSH, T4TOTAL, T3FREE, THYROIDAB in the last 72 hours.  Invalid input(s): FREET3 Urinalysis    Component Value Date/Time   COLORURINE YELLOW 05/11/2024 1020   APPEARANCEUR CLEAR 05/11/2024 1020   LABSPEC 1.021 05/11/2024 1020   PHURINE 6.0  05/11/2024 1020   GLUCOSEU >=500 (A) 05/11/2024 1020   HGBUR NEGATIVE 05/11/2024 1020   BILIRUBINUR NEGATIVE 05/11/2024 1020   KETONESUR NEGATIVE 05/11/2024 1020   PROTEINUR NEGATIVE 05/11/2024 1020   UROBILINOGEN 0.2 11/01/2014 1530   NITRITE NEGATIVE 05/11/2024 1020   LEUKOCYTESUR NEGATIVE 05/11/2024 1020    FURTHER DISCHARGE INSTRUCTIONS:   Get Medicines reviewed and adjusted: Please take all your medications with you for your next visit with your Primary MD   Laboratory/radiological data: Please request your Primary MD to go over all hospital tests and procedure/radiological results at the follow up, please ask your Primary MD to get all Hospital records sent to his/her office.   In some cases, they will be blood work, cultures and biopsy results pending at the time of your discharge. Please request that your primary care M.D. goes through all the records of your hospital data and follows up on these results.   Also Note the following: If you experience worsening of your admission symptoms, develop shortness of breath, life threatening emergency, suicidal or homicidal thoughts you must seek medical attention immediately by calling 911 or calling your MD immediately  if symptoms less severe.   You must read complete instructions/literature along with all the possible adverse reactions/side effects for all the Medicines you take and that have been prescribed to you. Take any new Medicines after you have completely understood and accpet all the possible adverse reactions/side effects.    Do not drive when taking Pain medications or sleeping medications (Benzodaizepines)   Do not take more than prescribed Pain, Sleep and Anxiety Medications. It is not advisable to combine anxiety,sleep and pain medications without talking with your primary care practitioner   Special Instructions: If you have smoked or chewed Tobacco  in the last 2 yrs please stop smoking, stop any regular Alcohol  and  or any Recreational drug use.   Wear Seat belts while driving.   Please note: You were cared for by a hospitalist during your hospital stay. Once you are discharged, your primary care physician will handle any further medical issues. Please note that NO REFILLS for any discharge medications will be authorized once you are discharged, as it is imperative that you return to your primary care physician (or establish a relationship with a primary care physician if you do not have one) for your post hospital discharge needs so that they can reassess your need for medications and monitor your lab values.  Time coordinating discharge: 35 minutes  SIGNED:  Jessi Jessop,  MD, PhD 05/21/2024, 9:02 AM

## 2024-05-21 NOTE — Plan of Care (Signed)
   Problem: Coping: Goal: Ability to adjust to condition or change in health will improve Outcome: Progressing   Problem: Nutritional: Goal: Maintenance of adequate nutrition will improve Outcome: Progressing   Problem: Skin Integrity: Goal: Risk for impaired skin integrity will decrease Outcome: Progressing   Problem: Activity: Goal: Risk for activity intolerance will decrease Outcome: Progressing

## 2024-05-21 NOTE — TOC Transition Note (Signed)
 Transition of Care [facility]) - Discharge Note   Patient Details  Name: Mike Hunter MRN: [id number] Date of Birth: Feb 16, 1951  Transition of Care [facility]) CM/SW Contact:  Tom-Johnson, Harvest Muskrat, RN Phone Number: 05/21/2024, 9:31 AM   Clinical Narrative:     Patient is scheduled for discharge today.  Readmission Risk Assessment done. Home health info, outpatient f/u, hospital f/u and discharge instructions on AVS. Prescriptions sent to [facility] pharmacy and patient will receive meds prior discharge. Wife, Winton to transport at discharge.  No further ICM needs noted.        Final next level of care: Home w Home Health Services Barriers to Discharge: Barriers Resolved   Patient Goals and CMS Choice Patient states their goals for this hospitalization and ongoing recovery are:: return home CMS Medicare.gov Compare Post Acute Care list provided to:: Patient Choice offered to / list presented to : Patient      Discharge Placement                Patient to be transferred to facility by: Wife Name of family member notified: Peggy    Discharge Plan and Services Additional resources added to the After Visit Summary for     Discharge Planning Services: CM Consult Post Acute Care Choice: Home Health                    HH Arranged: PT [facility] Agency: [facility] Date [facility] Agency Contacted: 05/11/24 Time HH Agency Contacted: 1609 Representative spoke with at [facility] Agency: Arna  Social Drivers of Health (SDOH) Interventions SDOH Screenings   Food Insecurity: No Food Insecurity (05/10/2024)  Housing: Low Risk  (05/10/2024)  Transportation Needs: No Transportation Needs (05/10/2024)  Utilities: Not At Risk (05/10/2024)  Social Connections: Socially Integrated (05/10/2024)  Tobacco Use: Medium Risk (05/20/2024)     Readmission Risk Interventions    02/29/2024    3:28 PM 12/31/2023   10:58 AM  Readmission Risk Prevention Plan  Transportation Screening Complete Complete   Medication Review Oceanographer) Complete Referral to Pharmacy  HRI or Home Care Consult Complete Complete  SW Recovery Care/Counseling Consult  Complete  Palliative Care Screening Not Applicable Not Applicable  Skilled Nursing Facility Not Applicable Not Applicable

## 2024-05-23 ENCOUNTER — Encounter (HOSPITAL_COMMUNITY): Payer: Self-pay | Admitting: Gastroenterology

## 2024-05-23 ENCOUNTER — Telehealth: Payer: Self-pay

## 2024-05-23 NOTE — Telephone Encounter (Signed)
-----   Message from Nurse Aldona R sent at 05/19/2024  6:17 PM EST ----- Regarding: 05/25/24 AVNA and CRT-D upgrade Important: list procedure date as first item in subject line, followed by procedure type (e.g., 02/26/24 AFib ablation)  Precert:  MD: Almetta Type of ablation: AV node Diagnosis: Afib CPT code: AV node (06349) Ablation scheduled (date/time): 12/10 1030am  Procedure:  Added to calendar? Yes Orders entered? Wasn't sure what to do with order since he is just adding a lead for the CRT-D.  Per last remote he has 100% left on his battery.   Letter complete? Yes Scheduled with cath lab? Yes Any medications to hold? Routine Labs ordered (CBC, BMET, PT/INR if on warfarin): Complete Mapping system: CARTO (lab 4 or 6) CARTO/OPAL rep notified? No Cardiac CT needed? No Dye allergy? No Pre-meds ordered and instructions given? N/A Letter method: MyChart H&P: 9/30 Device: Yes, ICD-BSX  Follow-up:  Cassie/Angel, please schedule Routine.  Covering RN - please send this message to [employer], EP scheduler, EP Scheduling pool, EP [employer], and CT scheduler (Brittany Lynch/Stephanie Mogg), if indicated.   Important: list procedure date as first item in subject line, followed by procedure type (e.g., 02/26/24 PPM implant)  Precert:  MD: Almetta Type of implant: CRT-D - upgrade Device manufacturer: [employer] Diagnosis: CM - CHF CPT code: Upgrade from dual ICD to CRT-D - 66735+66774 C-code(s), including quantity (if indicated): c 1900 Procedure scheduled (date/time): 12/10 at 1030  Procedure:  Scrub given? No  Medication instructions: May take Xarelto  up until morning of - other meds routine hold Message sent to CVRR? No Added to calendar? Yes Orders entered? No, - unsure about orders but will talk to Lloyde Ludlam Letter complete? Yes Scheduled with cath lab? Yes Labs ordered (CBC, BMET, PT/INR if on warfarin)? Complete Dye allergy? No Pre-meds ordered and  instructions given? N/A Letter method: MyChart Special instructions: N/A H&P: 9/30  Follow-up:  Cassie/Angel, please schedule Routine.  Covering RN:  Please send this message to [employer], EP scheduler, EP Scheduling pool, and EP [employer]. ----- Message ----- From: Casimir Aldona BRAVO, RN Sent: 05/19/2024   6:26 PM EST To: Temica Righetti, CMA; [place] MALVA Sever, RN;# Subject: 05/25/24 AVNA and CRT-D upgrade                Important: list procedure date as first item in subject line, followed by procedure type (e.g., 02/26/24 AFib ablation)  Precert:  MD: Almetta Type of ablation: AV node Diagnosis: Afib CPT code: AV node (06349) Ablation scheduled (date/time): 12/10 1030am  Procedure:  Added to calendar? Yes Orders entered? Wasn't sure what to do with order since he is just adding a lead for the CRT-D.  Per last remote he has 100% left on his battery.   Letter complete? Yes Scheduled with cath lab? Yes Any medications to hold? Routine Labs ordered (CBC, BMET, PT/INR if on warfarin): Complete Mapping system: CARTO (lab 4 or 6) CARTO/OPAL rep notified? No Cardiac CT needed? No Dye allergy? No Pre-meds ordered and instructions given? N/A Letter method: MyChart H&P: 9/30 Device: Yes, ICD-BSX  Follow-up:  Cassie/Angel, please schedule Routine.  Covering RN - please send this message to [employer], EP scheduler, EP Scheduling pool, EP [employer], and CT scheduler (Brittany Lynch/Stephanie Mogg), if indicated.

## 2024-05-24 NOTE — Pre-Procedure Instructions (Signed)
Instructed patient on the following items: Arrival time 0830 Nothing to eat or drink after midnight No meds AM of procedure Responsible person to drive you home and stay with you for 24 hrs Wash with special soap night before and morning of procedure  

## 2024-05-25 ENCOUNTER — Ambulatory Visit (HOSPITAL_COMMUNITY)

## 2024-05-25 ENCOUNTER — Other Ambulatory Visit: Payer: Self-pay

## 2024-05-25 ENCOUNTER — Encounter (HOSPITAL_COMMUNITY)
Admission: RE | Disposition: A | Payer: Self-pay | Attending: Student in an Organized Health Care Education/Training Program

## 2024-05-25 ENCOUNTER — Ambulatory Visit (HOSPITAL_COMMUNITY)
Admission: RE | Admit: 2024-05-25 | Discharge: 2024-05-25 | Disposition: A | Attending: Student in an Organized Health Care Education/Training Program | Admitting: Student in an Organized Health Care Education/Training Program

## 2024-05-25 DIAGNOSIS — E1151 Type 2 diabetes mellitus with diabetic peripheral angiopathy without gangrene: Secondary | ICD-10-CM | POA: Diagnosis not present

## 2024-05-25 DIAGNOSIS — I5020 Unspecified systolic (congestive) heart failure: Secondary | ICD-10-CM

## 2024-05-25 DIAGNOSIS — G4733 Obstructive sleep apnea (adult) (pediatric): Secondary | ICD-10-CM | POA: Insufficient documentation

## 2024-05-25 DIAGNOSIS — E66813 Obesity, class 3: Secondary | ICD-10-CM | POA: Insufficient documentation

## 2024-05-25 DIAGNOSIS — I4821 Permanent atrial fibrillation: Secondary | ICD-10-CM

## 2024-05-25 DIAGNOSIS — J449 Chronic obstructive pulmonary disease, unspecified: Secondary | ICD-10-CM | POA: Diagnosis not present

## 2024-05-25 DIAGNOSIS — I2510 Atherosclerotic heart disease of native coronary artery without angina pectoris: Secondary | ICD-10-CM | POA: Diagnosis not present

## 2024-05-25 DIAGNOSIS — E1122 Type 2 diabetes mellitus with diabetic chronic kidney disease: Secondary | ICD-10-CM | POA: Insufficient documentation

## 2024-05-25 DIAGNOSIS — N183 Chronic kidney disease, stage 3 unspecified: Secondary | ICD-10-CM | POA: Insufficient documentation

## 2024-05-25 DIAGNOSIS — Z6838 Body mass index (BMI) 38.0-38.9, adult: Secondary | ICD-10-CM | POA: Diagnosis not present

## 2024-05-25 DIAGNOSIS — Z7901 Long term (current) use of anticoagulants: Secondary | ICD-10-CM | POA: Insufficient documentation

## 2024-05-25 DIAGNOSIS — Z87891 Personal history of nicotine dependence: Secondary | ICD-10-CM | POA: Diagnosis not present

## 2024-05-25 DIAGNOSIS — Z9981 Dependence on supplemental oxygen: Secondary | ICD-10-CM | POA: Insufficient documentation

## 2024-05-25 DIAGNOSIS — Z7902 Long term (current) use of antithrombotics/antiplatelets: Secondary | ICD-10-CM | POA: Diagnosis not present

## 2024-05-25 DIAGNOSIS — K219 Gastro-esophageal reflux disease without esophagitis: Secondary | ICD-10-CM | POA: Diagnosis not present

## 2024-05-25 DIAGNOSIS — Z4502 Encounter for adjustment and management of automatic implantable cardiac defibrillator: Secondary | ICD-10-CM | POA: Diagnosis present

## 2024-05-25 DIAGNOSIS — I255 Ischemic cardiomyopathy: Secondary | ICD-10-CM

## 2024-05-25 DIAGNOSIS — I130 Hypertensive heart and chronic kidney disease with heart failure and stage 1 through stage 4 chronic kidney disease, or unspecified chronic kidney disease: Secondary | ICD-10-CM | POA: Diagnosis not present

## 2024-05-25 DIAGNOSIS — Z955 Presence of coronary angioplasty implant and graft: Secondary | ICD-10-CM | POA: Diagnosis not present

## 2024-05-25 HISTORY — PX: BIV ICD INSERTION CRT-D: EP1195

## 2024-05-25 HISTORY — PX: BIV ICD GENERATOR CHANGEOUT: EP1194

## 2024-05-25 HISTORY — PX: AV NODE ABLATION: EP1193

## 2024-05-25 LAB — GLUCOSE, CAPILLARY
Glucose-Capillary: 170 mg/dL — ABNORMAL HIGH (ref 70–99)
Glucose-Capillary: 148 mg/dL — ABNORMAL HIGH (ref 70–99)
Glucose-Capillary: 159 mg/dL — ABNORMAL HIGH (ref 70–99)

## 2024-05-25 MED ORDER — SODIUM CHLORIDE 0.9 % IV SOLN: INTRAVENOUS | Status: DC

## 2024-05-25 MED ORDER — HEPARIN SODIUM (PORCINE) 1000 UNIT/ML IJ SOLN
INTRAMUSCULAR | Status: DC | PRN
  Administered 2024-05-25: 1000 [IU] via INTRAVENOUS

## 2024-05-25 MED ORDER — HEPARIN (PORCINE) IN NACL 1000-0.9 UT/500ML-% IV SOLN
INTRAVENOUS | Status: DC | PRN
  Administered 2024-05-25 (×2): 500 mL

## 2024-05-25 MED ORDER — IPRATROPIUM-ALBUTEROL 0.5-2.5 (3) MG/3ML IN SOLN: 3.0000 mL | RESPIRATORY_TRACT | Status: DC | PRN

## 2024-05-25 MED ORDER — PHENYLEPHRINE 80 MCG/ML (10ML) SYRINGE FOR IV PUSH (FOR BLOOD PRESSURE SUPPORT)
PREFILLED_SYRINGE | INTRAVENOUS | Status: DC | PRN
  Administered 2024-05-25: 160 ug via INTRAVENOUS

## 2024-05-25 MED ORDER — HEPARIN SODIUM (PORCINE) 1000 UNIT/ML IJ SOLN
INTRAMUSCULAR | Status: AC
  Filled 2024-05-25: qty 10

## 2024-05-25 MED ORDER — FENTANYL CITRATE (PF) 100 MCG/2ML IJ SOLN
INTRAMUSCULAR | Status: DC | PRN
  Administered 2024-05-25: 100 ug via INTRAVENOUS

## 2024-05-25 MED ORDER — PROPOFOL 10 MG/ML IV BOLUS
INTRAVENOUS | Status: DC | PRN
  Administered 2024-05-25: 20 mg via INTRAVENOUS
  Administered 2024-05-25: 100 mg via INTRAVENOUS

## 2024-05-25 MED ORDER — POVIDONE-IODINE 10 % EX SWAB
2.0000 | Freq: Once | CUTANEOUS | Status: AC
  Administered 2024-05-25: 2 via TOPICAL

## 2024-05-25 MED ORDER — FENTANYL CITRATE (PF) 100 MCG/2ML IJ SOLN
25.0000 ug | INTRAMUSCULAR | Status: DC | PRN
  Administered 2024-05-25: 50 ug via INTRAVENOUS

## 2024-05-25 MED ORDER — ACETAMINOPHEN 500 MG PO TABS
1000.0000 mg | ORAL_TABLET | Freq: Once | ORAL | Status: AC
  Administered 2024-05-25: 1000 mg via ORAL

## 2024-05-25 MED ORDER — FENTANYL CITRATE (PF) 100 MCG/2ML IJ SOLN
INTRAMUSCULAR | Status: AC
  Filled 2024-05-25: qty 2

## 2024-05-25 MED ORDER — SODIUM CHLORIDE 0.9 % IV SOLN
INTRAVENOUS | Status: AC
  Administered 2024-05-25: 80 mg
  Filled 2024-05-25: qty 2

## 2024-05-25 MED ORDER — PHENYLEPHRINE HCL-NACL 20-0.9 MG/250ML-% IV SOLN
INTRAVENOUS | Status: DC | PRN
  Administered 2024-05-25: 50 ug/min via INTRAVENOUS
  Administered 2024-05-25: 25 ug/min via INTRAVENOUS

## 2024-05-25 MED ORDER — SUGAMMADEX SODIUM 200 MG/2ML IV SOLN
INTRAVENOUS | Status: DC | PRN
  Administered 2024-05-25: 400 mg via INTRAVENOUS

## 2024-05-25 MED ORDER — DEXAMETHASONE SOD PHOSPHATE PF 10 MG/ML IJ SOLN
INTRAMUSCULAR | Status: DC | PRN
  Administered 2024-05-25: 5 mg via INTRAVENOUS

## 2024-05-25 MED ORDER — ROCURONIUM BROMIDE 10 MG/ML (PF) SYRINGE
PREFILLED_SYRINGE | INTRAVENOUS | Status: DC | PRN
  Administered 2024-05-25: 50 mg via INTRAVENOUS
  Administered 2024-05-25 (×2): 20 mg via INTRAVENOUS

## 2024-05-25 MED ORDER — LIDOCAINE HCL (PF) 1 % IJ SOLN
INTRAMUSCULAR | Status: DC | PRN
  Administered 2024-05-25: 30 mL

## 2024-05-25 MED ORDER — LIDOCAINE 2% (20 MG/ML) 5 ML SYRINGE
INTRAMUSCULAR | Status: DC | PRN
  Administered 2024-05-25: 60 mg via INTRAVENOUS

## 2024-05-25 MED ORDER — ONDANSETRON HCL 4 MG/2ML IJ SOLN
INTRAMUSCULAR | Status: DC | PRN
  Administered 2024-05-25: 4 mg via INTRAVENOUS

## 2024-05-25 MED ORDER — CEFAZOLIN SODIUM-DEXTROSE 3-4 GM/150ML-% IV SOLN
3.0000 g | INTRAVENOUS | Status: AC
  Administered 2024-05-25: 3 g via INTRAVENOUS
  Filled 2024-05-25: qty 150

## 2024-05-25 MED ORDER — EPINEPHRINE 1 MG/10ML IV SOSY
PREFILLED_SYRINGE | INTRAVENOUS | Status: DC | PRN
  Administered 2024-05-25: 5 ug via INTRAVENOUS

## 2024-05-25 MED ORDER — IOHEXOL 350 MG/ML SOLN
INTRAVENOUS | Status: DC | PRN
  Administered 2024-05-25 (×2): 10 mL

## 2024-05-25 MED ORDER — LIDOCAINE HCL (PF) 1 % IJ SOLN
INTRAMUSCULAR | Status: AC
  Filled 2024-05-25: qty 60

## 2024-05-25 MED ORDER — ACETAMINOPHEN 500 MG PO TABS
ORAL_TABLET | ORAL | Status: AC
  Filled 2024-05-25: qty 2

## 2024-05-25 MED ORDER — SODIUM CHLORIDE 0.9 % IV SOLN: 80.0000 mg | INTRAVENOUS | Status: AC

## 2024-05-25 MED ORDER — IPRATROPIUM-ALBUTEROL 0.5-2.5 (3) MG/3ML IN SOLN
RESPIRATORY_TRACT | Status: AC
  Filled 2024-05-25: qty 3

## 2024-05-25 MED ORDER — CHLORHEXIDINE GLUCONATE 4 % EX SOLN
4.0000 | Freq: Once | CUTANEOUS | Status: DC
  Filled 2024-05-25: qty 60

## 2024-05-25 NOTE — Interval H&P Note (Signed)
 History and Physical Interval Note:  05/25/2024 10:57 AM  Alm BROCKS Emmer  has presented today for surgery, with the diagnosis of HF/ cardio.  The various methods of treatment have been discussed with the patient and family. After consideration of risks, benefits and other options for treatment, the patient has consented to  Procedure(s): AV NODE ABLATION (N/A) BIV ICD INSERTION CRT-D (N/A) BIV ICD GENERATOR CHANGEOUT (N/A) as a surgical intervention.  The patient's history has been reviewed, patient examined, no change in status, stable for surgery.  I have reviewed the patient's chart and labs.  Questions were answered to the patient's satisfaction.    Held Xarelto  since Sunday. NPO. Proceed with AVNA+DC ICD->CRTD upgrade. Likely SDD if stable.   [name]

## 2024-05-25 NOTE — Op Note (Signed)
 Procedure:  LEFT sided BSCI DC ICD->CRTD (add CS lead) upgrade Intracardiac catheter ablation of the AV node (CPT 93650) 3-Dimensional electroanatomic mapping (CPT [phone]) Ultrasound access (CPT [phone]) x 1   Attending: Adina Primus, MD   Indication for CRT: AF and LVEF < 35% on GDMT if the patient requires ventricular pacing or otherwise meets CRT criteria and AVNA or pharmacologic rate control will allow near 100% ventricular pacing with CRT (AHA/ACC/HFSA Class 2a, B-NR)   Complications: none apparent at time of procedure   EBL: minimal   Anesthesia: GA  Pre-Op Diagnosis: permanent AF/AFL with RVR, HFrEF, ICM  Post-Op Diagnosis: same   Procedure Date: 05/25/24  Initial Intervals: AF, QRS 163, QT 463, RR 853  Procedure (AVNA): The patient entered the EP lab in a fasting nonsedated state. The surgical time-out was achieved. The patient was placed under general anesthesia. The patient was draped and prepped in normal fashion with multiple layers of Hibiclens  scrub in the right groin. Access to the vein was achieved in the following manner: using ultrasound, the right femoral vein was visualized to be widely patent.  During real-time ultrasound, the right femoral vein was visualized and a standard access needle was seen directly puncturing the vein. Using the modified Seldinger technique, an 8 Fr short sheath was placed. Ultrasound images from vascular access were recorded and retained on the system.   Mapping: A Thermocool ST/SF D/F 3.5 mm irrigated-tip ablation catheter  was advanced into the right atrium through the 8 Fr short sheath.  An electroanatomic map was constructed of the RA, tricuspid annulus and the His bundle was located and marked on the map. The catheter was withdrawn posteriorly and inferiorly to target ablation at the compact AV node.    Ablation: RFA at 34 W was performed with complete heart block during RF. No underlying junctional escape < 40 bpm was observed.  The patient was observed for 1 hour with no evidence of A-V conduction at an intermittent pacing lower rate of 40 bpm.    Procedure (CS lead addition/generator replacement): The left shoulder was prepped with chlorhexidine  scrub. 30 cc lidocaine  was injected at the planned incision site. The previous pocket was opened with electrocautery and blunt dissection.    No venogram was performed. The left axillary vein was accessed using a standard access needle under fluoroscopic guidance over the first rib. A wire was passed into the IVC.    A standard 0.035 wire was advanced to the RA. The 9 Fr [place] Standard CS Guide Catheter was then advanced over the wire while connected to a contrast injection system. The dilator was removed and the CS guide was advanced into coronary sinus using fluoroscopic guidance. The CS balloon catheter was then advanced into the lumen of the CS guide and a coronary sinus venogram was obtained.  This demonstrated an initial target of a mid lateral vein. There was also a PL branch that tapered shortly after the takeoff. The [place] Advanced 5.5 Fr Renal lateral vein introducer (LVI) and Standard vein selector and 0.014 [employer] wire were used to cannulate the target branch. The vein introducer was advanced over the wire into the target vein with support of the vein selector. The lead was then delivered over the wire. Interrogation revealed adequate LV electrode parameters without phrenic capture at 10 V.   The [facility] Advanced 5.5 Fr Renal lateral vein introducer (LVI) and Standard vein selector was split and removed. The 9 Fr [employer]  Catheter was split and removed. The 0.014 [employer] wire was removed. The lead was affixed to the underlying fascia with two 0-silk sutures.   The device was then connected to the leads. The pocket was irrigated with gentamicin  solution. A TYRX pouch was used. The device was affixed to the  underlying fascia with a single 0-silk suture. The pocket was closed with continuous 2-0 V-lock, 3-0 Stratafix and Dermabond.   The device was evaluated by the representative of the cardiac device manufacturer under the supervision of the attending physician with implant parameters listed below.   Implanted Hardware: Implant Name Type Inv. Item Serial No. Manufacturer Lot No. LRB No. Used Action  CLOSURE PERCLOSE PROSTYLE - [id number] Vascular Products CLOSURE PERCLOSE PROSTYLE  ABBOTT VASCULAR DEVICES [id number] N/A 1 Implanted  LEAD ACUITY X4 4671 - [id number] Lead LEAD ACUITY X4 4671 [id number] [employer]  N/A 1 Implanted  DEFIB RESONATE HF CRT-D G547 - [id number] ICD Generator DEFIB RESONATE HF CRT-D G547 [id number] [employer]  N/A 1 Implanted  ICD VIGILANT DR D233 - [id number] Pacemaker ICD VIGILANT DR [phone] [id number] [employer]  N/A 1 Explanted  POUCH AIGIS-R ANTIBACT ICD LRG - [id number] Mesh General POUCH AIGIS-R ANTIBACT ICD LRG  [employer] RHYTHM MANAGEMENT [id number] N/A 1 Implanted   Device Information: CRTD: BSCI Resonate HF CRTD G547, SN [id number], DOI 05/25/24 RA: BSCI 7841 Ingevity Plus, SN [id number], DOI 03/01/24 RV ICD: BSCI Reliance 4-Front SGL Active, SN [id number], DOI 03/01/24 LV: BSCI Acuity X4 Straight [id number], SN [id number], DOI 05/25/24  Lead Interrogation Data: RA Sensing Amplitude: 4.7 mV (AF) RA Pace/Sense Impedance: 541 ohms  RA Capture Threshold: N/A (AF)  RV Sensing Amplitude: N/A (CHB without escape) RV Pace/Sense Impedance: 359 ohms  RV Shock Impedance: 62 ohms  RV Capture Threshold: 1.1 V @ 0.4 ms  LV Sensing Amplitude: N/A (CHB without escape) LV Pace/Sense Impedance: 635 ohms LV Capture Threshold: 1.3 V @ 1.0 ms (uni 3)  Final Intervals: BVP, PR 169, QRS 108, QT 443, RR 855  Summary: Successful AVNA, RFV access x1 closed with Perclose suture Successful LEFT BSCI DC ICD->CRTD upgrade (add LV lead, generator  replacement)  Recommendations: Routine post-procedure care with bedrest for 2 hours No heparin  (IV or subcutaneous) for 48 hours. No enoxaparin (IV or subcutaneous) for 7 days.  Resume Xarelto  20 mg at bedtime on Monday evening (12/15) Resume Plavix  75 mg daily on Monday morning (12/15) PA/lateral CXR today Wound check today Device interrogation post CXR Same day discharge if above are stable   Mike DELENA Primus, MD [facility]  Cardiac Electrophysiology

## 2024-05-25 NOTE — Discharge Instructions (Signed)

## 2024-05-25 NOTE — Progress Notes (Signed)
 Patient ambulated in the hallway. No bleeding or hematoma noted to right groin. Dressing to groin and left chest clean, dry, and intact. Assisted patient with getting dressed for discharge. No changes to sites.

## 2024-05-25 NOTE — Anesthesia Procedure Notes (Signed)
 Procedure Name: Intubation Date/Time: 05/25/2024 12:01 PM  Performed by: Hedy Jarred, CRNAPre-anesthesia Checklist: Patient identified, Emergency Drugs available, Suction available and Patient being monitored Patient Re-evaluated:Patient Re-evaluated prior to induction Oxygen Delivery Method: Circle System Utilized Preoxygenation: Pre-oxygenation with 100% oxygen Induction Type: IV induction Ventilation: Mask ventilation without difficulty Laryngoscope Size: Mac and 4 Grade View: Grade I Tube type: Oral Tube size: 8.0 mm Number of attempts: 1 Airway Equipment and Method: Stylet and Oral airway Placement Confirmation: ETT inserted through vocal cords under direct vision, positive ETCO2 and breath sounds checked- equal and bilateral Secured at: 23 cm Tube secured with: Tape Dental Injury: Teeth and Oropharynx as per pre-operative assessment

## 2024-05-25 NOTE — Anesthesia Preprocedure Evaluation (Addendum)
 Anesthesia Evaluation  Patient identified by MRN, date of birth, ID band Patient awake    Reviewed: Allergy & Precautions, NPO status , Patient's Chart, lab work & pertinent test results  Airway Mallampati: III  TM Distance: >3 FB Neck ROM: Full    Dental  (+) Missing   Pulmonary asthma , sleep apnea, Continuous Positive Airway Pressure Ventilation and Oxygen sleep apnea , COPD,  COPD inhaler and oxygen dependent, former smoker 3L [place] at night   Pulmonary exam normal        Cardiovascular hypertension, Pt. on medications + angina  + CAD and +CHF  Normal cardiovascular exam+ dysrhythmias Atrial Fibrillation + pacemaker + Cardiac Defibrillator   ECHO: 1. Left ventricular ejection fraction, by estimation, is 35 to 40%. The  left ventricle has moderately decreased function. The left ventricle  demonstrates global hypokinesis. Left ventricular diastolic function could  not be evaluated.   2. Right ventricular systolic function is mildly reduced. The right  ventricular size is normal. The estimated right ventricular systolic  pressure is 35.0 mmHg.   3. Left atrial size was moderately dilated. No left atrial/left atrial  appendage thrombus was detected. The LAA emptying velocity was 23 cm/s.   4. Right atrial size was mildly dilated.   5. The mitral valve is normal in structure. Trivial mitral valve  regurgitation. No evidence of mitral stenosis.   6. Possibly a functionally bicuspid aortic valve. There is a calcified  raphe between the left and noncoronary cusps. The aortic valve is  abnormal. There is mild calcification of the aortic valve. There is mild  thickening of the aortic valve. Aortic  valve regurgitation is not visualized. Aortic valve  sclerosis/calcification is present, without any evidence of aortic  stenosis.   7. There is Moderate (Grade III) plaque involving the descending aorta.      Neuro/Psych  PSYCHIATRIC  DISORDERS Anxiety Depression Bipolar Disorder  Dementia negative neurological ROS     GI/Hepatic Neg liver ROS,GERD  Medicated and Controlled,,  Endo/Other  diabetes  Class 3 obesity  Renal/GU Renal InsufficiencyRenal disease     Musculoskeletal   Abdominal  (+) + obese  Peds  Hematology  (+) Blood dyscrasia (Xarelto ), anemia   Anesthesia Other Findings CHF  Reproductive/Obstetrics                              Anesthesia Physical Anesthesia Plan  ASA: 4  Anesthesia Plan: General   Post-op Pain Management:    Induction: Intravenous  PONV Risk Score and Plan: 2 and Ondansetron , Dexamethasone and Treatment may vary due to age or medical condition  Airway Management Planned: Oral ETT  Additional Equipment: ClearSight  Intra-op Plan:   Post-operative Plan: Extubation in OR  Informed Consent: I have reviewed the patients History and Physical, chart, labs and discussed the procedure including the risks, benefits and alternatives for the proposed anesthesia with the patient or authorized representative who has indicated his/her understanding and acceptance.     Dental advisory given  Plan Discussed with: CRNA  Anesthesia Plan Comments:          Anesthesia Quick Evaluation

## 2024-05-25 NOTE — Transfer of Care (Signed)
 Immediate Anesthesia Transfer of Care Note  Patient: Mike Hunter  Procedure(s) Performed: AV NODE ABLATION BIV ICD INSERTION CRT-D BIV ICD GENERATOR CHANGEOUT  Patient Location: Cath Lab  Anesthesia Type:General  Level of Consciousness: awake, alert , and oriented  Airway & Oxygen Therapy: Patient Spontanous Breathing  Post-op Assessment: Report given to RN and Post -op Vital signs reviewed and stable  Post vital signs: Reviewed and stable  Last Vitals:  Vitals Value Taken Time  BP 110/51 05/25/24 14:30  Temp 36.7 C 05/25/24 14:24  Pulse 70 05/25/24 14:34  Resp 17 05/25/24 14:34  SpO2 98 % 05/25/24 14:34  Vitals shown include unfiled device data.  Last Pain:  Vitals:   05/25/24 1424  TempSrc: Oral  PainSc:          Complications: There were no known notable events for this encounter.

## 2024-05-25 NOTE — Progress Notes (Addendum)
 Patient  stood up and transferred to to the chair to go got have chest XRAY completed. Patient urinated in the urinal before leaving for testing. Right groin site intact. No bleeding or hematoma noted. Dressing to left chest intact.

## 2024-05-26 ENCOUNTER — Telehealth: Payer: Self-pay | Admitting: Physician Assistant

## 2024-05-26 ENCOUNTER — Encounter (HOSPITAL_COMMUNITY): Payer: Self-pay | Admitting: Student in an Organized Health Care Education/Training Program

## 2024-05-26 NOTE — Telephone Encounter (Signed)
 Patient is requesting refill on their TRESIBA  .  Patient stated Lyndy know where to sent it too. Patient request glida order it because she know all about it   Please advise. Patient can be reached at  [phone]

## 2024-05-26 NOTE — Anesthesia Postprocedure Evaluation (Signed)
 Anesthesia Post Note  Patient: Mike Hunter  Procedure(s) Performed: AV NODE ABLATION BIV ICD INSERTION CRT-D BIV ICD GENERATOR CHANGEOUT     Patient location during evaluation: PACU Anesthesia Type: General Level of consciousness: awake Pain management: pain level controlled Vital Signs Assessment: post-procedure vital signs reviewed and stable Respiratory status: spontaneous breathing and respiratory function stable Cardiovascular status: blood pressure returned to baseline Postop Assessment: no apparent nausea or vomiting Anesthetic complications: no   There were no known notable events for this encounter.  Last Vitals:  Vitals:   05/25/24 1630 05/25/24 1700  BP: (!) 102/54 (!) 111/56  Pulse: 70 69  Resp: 19 17  Temp:    SpO2: 96% 97%    Last Pain:  Vitals:   05/25/24 1519  TempSrc:   PainSc: 7                  Mayleigh Tetrault T Colhoun

## 2024-05-26 NOTE — Telephone Encounter (Signed)
 Pt wife calling to see if he needs to have both 12/24 and 1/12 or if he can wait until 1/12 for both. Please advise.

## 2024-05-26 NOTE — Telephone Encounter (Signed)
 Will forward this to device team and Renee for review.  Pt is scheduled 12/24 for 90 day s/p defib implant and 1/12 - 30 day s/p defib appt.  Pt asking if he needs both appts.  Also has [facility] appt 12/29 and f/u with Dr Francyne 06/21/24.

## 2024-05-27 ENCOUNTER — Telehealth: Payer: Self-pay | Admitting: Student in an Organized Health Care Education/Training Program

## 2024-05-27 DIAGNOSIS — I4821 Permanent atrial fibrillation: Secondary | ICD-10-CM

## 2024-05-27 MED ORDER — RIVAROXABAN 20 MG PO TABS: 20.0000 mg | ORAL_TABLET | Freq: Every day | ORAL | 3 refills | Status: DC

## 2024-05-27 NOTE — Telephone Encounter (Signed)
 Pt c/o medication issue:  1. Name of Medication:   metoprolol  succinate (TOPROL -XL) 50 MG 24 hr tablet   2. How are you currently taking this medication (dosage and times per day)?   As prescribed  3. Are you having a reaction (difficulty breathing--STAT)?   4. What is your medication issue?   Patient stated his medication was increased while in the hospital.  Patient wants a call back to confirm which dosage of this medication he should be taking.  Patient stated he will need a refill sent to [facility] [address] - [place], [place] - [address]  Patient stated can also speak with his wife Mike Hunter) regarding this medication issue.

## 2024-05-27 NOTE — Telephone Encounter (Signed)
 Spoke with wife. Has somewhat variable kidney fxn but rec'd he go to Xarelto  20 mg daily based off CrCl >50. She said they have 15 mg tablets and with insurance issues can't get the 20 mg. Has one bottle he was sent home with. Understands that he is under dosed with the 15 mg dose. Sent new prescription to [facility] for 20 mg daily.

## 2024-05-27 NOTE — Telephone Encounter (Signed)
 When d/c'd from the hospital- the pt was given a bottle of Xarelto  20 mg; but he was previously on Xarelto  15 mg.  Want to know if he is supposed to be taking 15 mg or 20 mg. And if it is increased to 20mg , then they need a new prescription sent to [facility]- that is where they get their meds.   Informed her that I will send this information to his provider and will be back in touch. She verbalized understanding.

## 2024-06-03 ENCOUNTER — Encounter: Admitting: Student in an Organized Health Care Education/Training Program

## 2024-06-07 ENCOUNTER — Ambulatory Visit

## 2024-06-07 DIAGNOSIS — I4821 Permanent atrial fibrillation: Secondary | ICD-10-CM

## 2024-06-07 DIAGNOSIS — I472 Ventricular tachycardia, unspecified: Secondary | ICD-10-CM

## 2024-06-07 DIAGNOSIS — I429 Cardiomyopathy, unspecified: Secondary | ICD-10-CM

## 2024-06-07 DIAGNOSIS — I5022 Chronic systolic (congestive) heart failure: Secondary | ICD-10-CM

## 2024-06-07 LAB — CUP PACEART INCLINIC DEVICE CHECK
Date Time Interrogation Session: 20251223103503
HighPow Impedance: 66 Ohm
Implantable Lead Connection Status: 753985
Implantable Lead Connection Status: 753985
Implantable Lead Connection Status: 753985
Implantable Lead Implant Date: 20250916
Implantable Lead Implant Date: 20250916
Implantable Lead Implant Date: 20251210
Implantable Lead Location: 753858
Implantable Lead Location: 753859
Implantable Lead Location: 753860
Implantable Lead Model: 4671
Implantable Lead Model: 673
Implantable Lead Model: 7841
Implantable Lead Serial Number: 1653982
Implantable Lead Serial Number: 272899
Implantable Lead Serial Number: 901898
Implantable Pulse Generator Implant Date: 20251210
Lead Channel Impedance Value: 425 Ohm
Lead Channel Impedance Value: 427 Ohm
Lead Channel Impedance Value: 652 Ohm
Lead Channel Pacing Threshold Amplitude: 0.7 V
Lead Channel Pacing Threshold Amplitude: 0.9 V
Lead Channel Pacing Threshold Pulse Width: 0.4 ms
Lead Channel Pacing Threshold Pulse Width: 1 ms
Lead Channel Setting Pacing Amplitude: 2.5 V
Lead Channel Setting Pacing Amplitude: 3.5 V
Lead Channel Setting Pacing Pulse Width: 0.4 ms
Lead Channel Setting Pacing Pulse Width: 1 ms
Lead Channel Setting Sensing Sensitivity: 0.5 mV
Lead Channel Setting Sensing Sensitivity: 1 mV
Pulse Gen Serial Number: 368839
Zone Setting Status: 755011

## 2024-06-07 NOTE — Progress Notes (Addendum)
 Normal multi chamber ICD wound check. Wound well healed. Presenting rhythm: AF/BP 70 w/ PVC's. Routine testing performed. Thresholds, sensing, and impedance consistent with implant measurements. No treated arrhythmias. Hx AF on Xarelto  per EPIC rate controlled. Reviewed arm restrictions to continue for 6 weeks total post op. Reviewed shock plan. Pt enrolled in remote follow-up.  Programming Changes: - RV output reprogrammed from 3.0V @ 0.39ms to 2.5V @ 0.65ms. - LV output reprogrammed from 4.0V @ 1.7ms to 3.5V @ 1.6ms.

## 2024-06-07 NOTE — Patient Instructions (Signed)
" °  After Your ICD (Implantable Cardiac Defibrillator)   Monitor your defibrillator site for redness, swelling, and drainage. Call the device clinic at [phone] if you experience these symptoms or fever/chills.  Your incision was closed with Dermabond:  You may shower 1 day after your defibrillator implant and wash your incision with soap and water. Avoid lotions, ointments, or perfumes over your incision until it is well-healed.  You may use a hot tub or a pool after your wound check appointment if the incision is completely closed.  Do not lift, push or pull greater than 10 pounds with the affected arm until JANUARY 21st. There are no other restrictions in arm movement after your wound check appointment.  Your ICD is designed to protect you from life threatening heart rhythms. Because of this, you may receive a shock.   1 shock with no symptoms:  Call the office during business hours. 1 shock with symptoms (chest pain, chest pressure, dizziness, lightheadedness, shortness of breath, overall feeling unwell):  Call 911. If you experience 2 or more shocks in 24 hours:  Call 911. If you receive a shock, you should not drive.  [place] DMV - no driving for 6 months if you receive appropriate therapy from your ICD.   ICD Alerts:  Some alerts are vibratory and others beep. These are NOT emergencies. Please call our office to let us  know. If this occurs at night or on weekends, it can wait until the next business day. Send a remote transmission.  If your device is capable of reading fluid status (for heart failure), you will be offered monthly monitoring to review this with you.   Remote monitoring is used to monitor your ICD from home. This monitoring is scheduled every 91 days by our office. It allows us  to keep an eye on the functioning of your device to ensure it is working properly. You will routinely see your Electrophysiologist annually (more often if necessary).  "

## 2024-06-08 ENCOUNTER — Ambulatory Visit: Admitting: Physician Assistant

## 2024-06-10 ENCOUNTER — Ambulatory Visit: Payer: Self-pay | Admitting: Student in an Organized Health Care Education/Training Program

## 2024-06-13 ENCOUNTER — Ambulatory Visit (HOSPITAL_COMMUNITY): Admitting: Internal Medicine

## 2024-06-14 ENCOUNTER — Ambulatory Visit (HOSPITAL_COMMUNITY): Admitting: Internal Medicine

## 2024-06-15 ENCOUNTER — Telehealth: Payer: Self-pay | Admitting: Cardiovascular Disease

## 2024-06-15 NOTE — Telephone Encounter (Signed)
 Sorry, I misread that. Have him take an extra 50 mg daily. So take 100 mg every AM and 50 mg every PM.

## 2024-06-15 NOTE — Telephone Encounter (Signed)
 Patient c/o Palpitations:  STAT if patient reporting lightheadedness, shortness of breath, or chest pain  How long have you had palpitations/irregular HR/ Afib? Are you having the symptoms now? Today  Are you currently experiencing lightheadedness, SOB or CP? Lightheadedness, SOB  Do you have a history of afib (atrial fibrillation) or irregular heart rhythm? Yes   Have you checked your BP or HR? (document readings if available): 104/60s  Are you experiencing any other symptoms? Lightheadedness, Dizziness    PT wants to know if his aortic valve leakage and stage 3 COPD are related

## 2024-06-15 NOTE — Telephone Encounter (Signed)
 If his BP is at least 105/60, please have him increase his metoprolol   succinate to 100 mg daily. It looks like the AV node was only stunned by the ablation and is conducting again.;

## 2024-06-15 NOTE — Telephone Encounter (Signed)
 Called and spoke to pt. Gave him Dr. Tyrone rec's: take an extra 50 mg toprol  xl in the AM as long as by is at least 105/60. So he will be taking 100 mg in am and 50 mg in pm. ER precautions also reviewed with pt.   He had two questions: what happens if Ablation does not last. And will valve replacement be at [facility].   Advised him to talk to Dr. JAYSON on Tuesday about this, and that I have seen pt's have more than one ablation, but this is case by case and only MD can advise. Also if [facility] docs can not do certain valve replacement we very often refer them to other systems, usually in [place].   Pt very appreciative of care received at [facility] and wanted everyone here to know that he really appreciates everyone.   He also states that Pam, his wife, can receive/give his health hx/info. Advised him to fill out the HIPAA/DPR form on 06/21/24 when checking in so we can have this updated in Epic.

## 2024-06-15 NOTE — Telephone Encounter (Signed)
 Received incoming STAT call from pt. He has several concerns, but the main one is: Today, while working with HHPT they noticed that his HR was all over the place, ranging from 60-125. He is also feeling dizzy/lightheaded, and very SOB with these episodes. Today is the first time he noticed this correlated with the HR episodes. With any normal activity his HR jumps up to over 100. He wants to know if his device is showing anything r/t this. Also, his BP was low for him at 104/60's. He had an Ablation and ICD placed on 05/25/24. Dr. Almetta told him he was in Chronic Afib. He is also c/o his chest feeling heavy and tight, but this is not a new symptom.   He sees Dr. Francyne on 06/21/24. He would like Dr. JAYSON to know that he is in agreement to move forward with a Valve Replacement if this continues to be the plan.   Advised pt to all Device Clinic.

## 2024-06-15 NOTE — Telephone Encounter (Signed)
 Spoke with Pt.  Unable to get transmission for today, but transmission received on 06/08/2024 with presenting rhythm Afib/VS/VP with variable heart rate as high as 110 bpm.  Pt is s/p AV node ablation 05/25/2024 with upgrade to BIV device.  As of transmission received on 06/08/2024 Pt BIV pacing 55%.    Pt states compliant with Toprol  XL 50 mg BID.  States he has noted elevated heart rates of late with SOB.  He had to cut conversation with this nurse short because his home health nurse arrived.  Finished discussion with Pt's wife.  Advised if he felt ok with mild SOB would advise he avoid going to the hospital d/t increased risk of contracting an URI.  Did advise for severe SOB, chest pain he should seek medical attention.  Wife indicates understanding.  Appears Pt has regained conduction post AV node ablation with decreased BIV pacing.  Will forward to Dr. Almetta and Dr. JAYSON for review.  Pt has follow up scheduled with Dr. JAYSON next Tuesday 06/21/2024.

## 2024-06-15 NOTE — Telephone Encounter (Signed)
 Called pt lvm for pt to send a transmission and left DC direct number

## 2024-06-17 ENCOUNTER — Emergency Department (HOSPITAL_COMMUNITY)

## 2024-06-17 ENCOUNTER — Other Ambulatory Visit: Payer: Self-pay

## 2024-06-17 ENCOUNTER — Telehealth: Payer: Self-pay

## 2024-06-17 ENCOUNTER — Emergency Department (HOSPITAL_COMMUNITY)
Admission: EM | Admit: 2024-06-17 | Discharge: 2024-06-18 | Disposition: A | Discharge status: Home / Self Care | Source: Home / Self Care

## 2024-06-17 DIAGNOSIS — E119 Type 2 diabetes mellitus without complications: Secondary | ICD-10-CM | POA: Diagnosis not present

## 2024-06-17 DIAGNOSIS — Z7951 Long term (current) use of inhaled steroids: Secondary | ICD-10-CM | POA: Diagnosis not present

## 2024-06-17 DIAGNOSIS — Z794 Long term (current) use of insulin: Secondary | ICD-10-CM | POA: Diagnosis not present

## 2024-06-17 DIAGNOSIS — I509 Heart failure, unspecified: Secondary | ICD-10-CM | POA: Diagnosis not present

## 2024-06-17 DIAGNOSIS — M25552 Pain in left hip: Secondary | ICD-10-CM | POA: Insufficient documentation

## 2024-06-17 DIAGNOSIS — D72829 Elevated white blood cell count, unspecified: Secondary | ICD-10-CM | POA: Diagnosis not present

## 2024-06-17 DIAGNOSIS — W19XXXA Unspecified fall, initial encounter: Secondary | ICD-10-CM

## 2024-06-17 DIAGNOSIS — I110 Hypertensive heart disease with heart failure: Secondary | ICD-10-CM | POA: Insufficient documentation

## 2024-06-17 DIAGNOSIS — Z7901 Long term (current) use of anticoagulants: Secondary | ICD-10-CM | POA: Diagnosis not present

## 2024-06-17 DIAGNOSIS — Z79899 Other long term (current) drug therapy: Secondary | ICD-10-CM | POA: Diagnosis not present

## 2024-06-17 DIAGNOSIS — Y92512 Supermarket, store or market as the place of occurrence of the external cause: Secondary | ICD-10-CM | POA: Diagnosis not present

## 2024-06-17 DIAGNOSIS — J189 Pneumonia, unspecified organism: Secondary | ICD-10-CM | POA: Insufficient documentation

## 2024-06-17 DIAGNOSIS — W01190A Fall on same level from slipping, tripping and stumbling with subsequent striking against furniture, initial encounter: Secondary | ICD-10-CM | POA: Insufficient documentation

## 2024-06-17 DIAGNOSIS — Z7902 Long term (current) use of antithrombotics/antiplatelets: Secondary | ICD-10-CM | POA: Insufficient documentation

## 2024-06-17 DIAGNOSIS — Z23 Encounter for immunization: Secondary | ICD-10-CM | POA: Diagnosis not present

## 2024-06-17 DIAGNOSIS — M542 Cervicalgia: Secondary | ICD-10-CM | POA: Insufficient documentation

## 2024-06-17 DIAGNOSIS — I4891 Unspecified atrial fibrillation: Secondary | ICD-10-CM | POA: Insufficient documentation

## 2024-06-17 DIAGNOSIS — I4819 Other persistent atrial fibrillation: Secondary | ICD-10-CM

## 2024-06-17 DIAGNOSIS — J449 Chronic obstructive pulmonary disease, unspecified: Secondary | ICD-10-CM | POA: Diagnosis not present

## 2024-06-17 DIAGNOSIS — S0990XA Unspecified injury of head, initial encounter: Secondary | ICD-10-CM | POA: Diagnosis not present

## 2024-06-17 DIAGNOSIS — S59912A Unspecified injury of left forearm, initial encounter: Secondary | ICD-10-CM | POA: Diagnosis present

## 2024-06-17 DIAGNOSIS — S51812A Laceration without foreign body of left forearm, initial encounter: Secondary | ICD-10-CM | POA: Insufficient documentation

## 2024-06-17 LAB — URINALYSIS, ROUTINE W REFLEX MICROSCOPIC
Bacteria, UA: NONE SEEN
Bilirubin Urine: NEGATIVE
Glucose, UA: >=500 mg/dL — AB
Hgb urine dipstick: NEGATIVE
Ketones, ur: NEGATIVE mg/dL
Leukocytes,Ua: NEGATIVE
Nitrite: NEGATIVE
Protein, ur: NEGATIVE mg/dL
Specific Gravity, Urine: 1.024 (ref 1.005–1.030)
pH: 6 (ref 5.0–8.0)

## 2024-06-17 LAB — I-STAT CHEM 8, ED
BUN: 43 mg/dL — ABNORMAL HIGH (ref 8–23)
Calcium, Ion: 1.15 mmol/L (ref 1.15–1.40)
Chloride: 104 mmol/L (ref 98–111)
Creatinine, Ser: 1.9 mg/dL — ABNORMAL HIGH (ref 0.61–1.24)
Glucose, Bld: 195 mg/dL — ABNORMAL HIGH (ref 70–99)
HCT: 32 % — ABNORMAL LOW (ref 39.0–52.0)
Hemoglobin: 10.9 g/dL — ABNORMAL LOW (ref 13.0–17.0)
Potassium: 4 mmol/L (ref 3.5–5.1)
Sodium: 141 mmol/L (ref 135–145)
TCO2: 23 mmol/L (ref 22–32)

## 2024-06-17 LAB — COMPREHENSIVE METABOLIC PANEL WITH GFR
ALT: 39 U/L (ref 0–44)
AST: 33 U/L (ref 15–41)
Albumin: 4.1 g/dL (ref 3.5–5.0)
Alkaline Phosphatase: 113 U/L (ref 38–126)
Anion gap: 14 (ref 5–15)
BUN: 45 mg/dL — ABNORMAL HIGH (ref 8–23)
CO2: 24 mmol/L (ref 22–32)
Calcium: 9.2 mg/dL (ref 8.9–10.3)
Chloride: 102 mmol/L (ref 98–111)
Creatinine, Ser: 1.75 mg/dL — ABNORMAL HIGH (ref 0.61–1.24)
GFR, Estimated: 40 mL/min — ABNORMAL LOW
Glucose, Bld: 188 mg/dL — ABNORMAL HIGH (ref 70–99)
Potassium: 4.1 mmol/L (ref 3.5–5.1)
Sodium: 140 mmol/L (ref 135–145)
Total Bilirubin: 0.4 mg/dL (ref 0.0–1.2)
Total Protein: 7.9 g/dL (ref 6.5–8.1)

## 2024-06-17 LAB — CBC
HCT: 35.1 % — ABNORMAL LOW (ref 39.0–52.0)
Hemoglobin: 10.2 g/dL — ABNORMAL LOW (ref 13.0–17.0)
MCH: 21.6 pg — ABNORMAL LOW (ref 26.0–34.0)
MCHC: 29.1 g/dL — ABNORMAL LOW (ref 30.0–36.0)
MCV: 74.4 fL — ABNORMAL LOW (ref 80.0–100.0)
Platelets: 331 K/uL (ref 150–400)
RBC: 4.72 MIL/uL (ref 4.22–5.81)
RDW: 18 % — ABNORMAL HIGH (ref 11.5–15.5)
WBC: 11.1 K/uL — ABNORMAL HIGH (ref 4.0–10.5)
nRBC: 0 % (ref 0.0–0.2)

## 2024-06-17 LAB — PROTIME-INR
INR: 1.3 — ABNORMAL HIGH (ref 0.8–1.2)
Prothrombin Time: 16.8 s — ABNORMAL HIGH (ref 11.4–15.2)

## 2024-06-17 LAB — TROPONIN T, HIGH SENSITIVITY
Troponin T High Sensitivity: 30 ng/L — ABNORMAL HIGH (ref 0–19)
Troponin T High Sensitivity: 26 ng/L — ABNORMAL HIGH (ref 0–19)

## 2024-06-17 MED ORDER — IOHEXOL 350 MG/ML SOLN
75.0000 mL | Freq: Once | INTRAVENOUS | Status: AC | PRN
  Administered 2024-06-17: 75 mL via INTRAVENOUS

## 2024-06-17 MED ORDER — TETANUS-DIPHTH-ACELL PERTUSSIS 5-2-15.5 LF-MCG/0.5 IM SUSP
0.5000 mL | Freq: Once | INTRAMUSCULAR | Status: AC
  Administered 2024-06-18: 0.5 mL via INTRAMUSCULAR
  Filled 2024-06-17: qty 0.5

## 2024-06-17 NOTE — Telephone Encounter (Signed)
 Attempted phone call to pt and left voicemail message to contact office at [phone].  Ablation Instructions placed on pt's MyChart.

## 2024-06-17 NOTE — ED Notes (Signed)
Pt transported to CT with this nurse °

## 2024-06-17 NOTE — ED Triage Notes (Signed)
 Patient coming from home. Patient had a mechanical fall. Patient stated he turned, became slightly dizzy, and tripped over his feet. Patient stated he hit the left side of hit head and his left shoulder. Patient is on two blood thinners, Eliquis  and Zerelto. No obvious trauma. Patient has been having chest pain since noon today. Patient has a significant cardiac history with an ablation scheduled this Thursday.  EMS VS 138/76 BP 98 HR afib rhythm 100% RA, O2 prn Hx of copd 122 [facility] gave 324 aspirin  and 100 mcg of fentanyl 

## 2024-06-17 NOTE — Telephone Encounter (Signed)
 Patient sent a transmission today and asked to review what is currently going on with his heart.    Transmission Reviewed:  Findings similar to previous on 12/24.  Patient continues to be in AF with VS/VP rhythm and is predominantly conducting.  BV pacing: 55%.  AV node ablation did not take.   Patient has numerous questions regarding what are next steps. Also continues to report SOB and is concerned that his breathing issues are coming from either this problem or his leaky heart valve.  States his father died with heart valve issue and he was having SOB just like what patient is experiencing.    He would like some definitive answers about where we go from here and what his health outcomes will look like with these 2 problems.   He has appt with Dr. Francyne on Tuesday.  Advised him that Dr. Francyne can have a higher level discussion around his concerns at that time.

## 2024-06-17 NOTE — ED Notes (Signed)
"  Pt placed in C-collar   "

## 2024-06-17 NOTE — ED Provider Notes (Signed)
 " [facility] Provider Note   CSN: [id number] Arrival date & time: 06/17/24  1911     Patient presents with: Chest Pain and Mike Hunter is a 74 y.o. male.   Patient with history of chronic A-fib on Xarelto , CHF, COPD, diabetes, morbid obesity, hypertension, OSA, s/p defibrillator placement presents today with complaints of fall.  He reports that throughout the day he has felt progressively more weak and dizzy.  He went to the grocery store and was helping carry groceries into his house, he turned suddenly and felt dizzy and one of his feet got caught behind the other and he lost his balance and fell forward and hit the left side of his head on a cabinet and then fell to the floor on his left hip.  He did not lose consciousness.  He was not able to get up after the fall, EMS was called for transport here.  He reports pain to the left side of his head, his neck, and his left hip.  Reports that he has had some chest pain today, no shortness of breath.  No leg pain or leg swelling. Reports that he has had some episodes of weakness/dizziness recently, was seen by his cardiologist and suspected that his chronic afib was causing these symptoms, has ablation scheduled for 1/08.  The history is provided by the patient. No language interpreter was used.  Chest Pain Fall Associated symptoms include chest pain.       Prior to Admission medications  Medication Sig Start Date End Date Taking? Authorizing Provider  ACETAMINOPHEN  ER PO Take 1,000 mg by mouth every 8 (eight) hours as needed for pain.    [provider]  albuterol  (PROVENTIL  HFA;VENTOLIN  HFA) 108 (90 BASE) MCG/ACT inhaler Inhale 2 puffs into the lungs every 4 (four) hours as needed for wheezing or shortness of breath.    [provider]  atorvastatin  (LIPITOR ) 80 MG tablet Take 80 mg by mouth at bedtime.    [provider]  azithromycin  (ZITHROMAX ) 250 MG tablet  Take 250 mg by mouth every Monday, Wednesday, and Friday. 02/23/24   [provider]  benzonatate  (TESSALON ) 200 MG capsule Take 1 capsule (200 mg total) by mouth 3 (three) times daily as needed for cough. Patient not taking: Reported on 05/10/2024 04/07/24   Barbarann Nest, MD  budesonide  (PULMICORT ) 0.5 MG/2ML nebulizer solution Take 2 mLs by nebulization 2 (two) times daily.    [provider]  clopidogrel  (PLAVIX ) 75 MG tablet Take 1 tablet (75 mg total) by mouth daily. 05/21/24 05/21/25  Gherghe, Costin M, MD  dapagliflozin propanediol (FARXIGA) 10 MG TABS tablet Take 10 mg by mouth daily. Patient not taking: Reported on 05/23/2024    [provider]  donepezil  (ARICEPT ) 10 MG tablet Take 10 mg by mouth at bedtime.    [provider]  doxepin  (SINEQUAN ) 10 MG capsule Take 10-20 mg by mouth at bedtime.    [provider]  insulin  aspart (NOVOLOG  FLEXPEN) 100 UNIT/ML injection Inject 0-13 Units into the skin See admin instructions. Inject 0-13 units into the skin three times a day with meals, PER SLIDING SCALE    [provider]  iron  polysaccharides (NIFEREX) 150 MG capsule Take 150 mg by mouth 2 (two) times daily. 02/25/24   [provider]  isosorbide  mononitrate (IMDUR ) 30 MG 24 hr tablet Take 30 mg by mouth daily.    [provider]  latanoprost  (XALATAN ) 0.005 % ophthalmic solution Place 1 drop into both eyes at bedtime.      [provider]  losartan  (COZAAR ) 25 MG tablet Take 1 tablet (25 mg total) by mouth daily. 03/09/24   Patsy Lenis, MD  memantine  (NAMENDA ) 10 MG tablet Take 10 mg by mouth 2 (two) times daily.    [provider]  metoprolol  succinate (TOPROL -XL) 50 MG 24 hr tablet Take 1 tablet (50 mg total) by mouth in the morning and at bedtime. Take with or immediately following a meal. 04/11/24   Almetta Donnice LABOR, MD  mirabegron  ER (MYRBETRIQ ) 50 MG TB24 tablet Take 50 mg by mouth daily.  02/09/24 02/03/25  [provider]  montelukast  (SINGULAIR ) 10 MG tablet Take 10 mg by mouth at bedtime.    [provider]  Multiple Vitamin (MULTIVITAMIN WITH MINERALS) TABS tablet Take 1 tablet by mouth daily with breakfast.    [provider]  Multiple Vitamins-Minerals (PRESERVISION AREDS PO) Take 2 capsules by mouth in the morning.    [provider]  omeprazole (PRILOSEC) 40 MG capsule Take 1 capsule by mouth daily after supper. 02/02/17   [provider]  oxybutynin  (DITROPAN -XL) 10 MG 24 hr tablet Take 10 mg by mouth daily.    [provider]  OXYGEN Inhale 3 L into the lungs daily as needed (Shortness of breath, wheezing).    [provider]  rivaroxaban  (XARELTO ) 20 MG TABS tablet Take 1 tablet (20 mg total) by mouth daily with supper. 05/27/24   Almetta Donnice LABOR, MD  sertraline  (ZOLOFT ) 100 MG tablet Take 100 mg by mouth daily.    [provider]  spironolactone  (ALDACTONE ) 25 MG tablet Take 25 mg by mouth daily.    [provider]  Teriparatide  560 MCG/2.24ML SOPN Inject 20 mcg into the skin at bedtime. 02/25/24   [provider]  torsemide  (DEMADEX ) 20 MG tablet Take 1 tablet (20 mg total) by mouth daily. Patient taking differently: Take 20 mg by mouth 2 (two) times daily at 10 AM and 5 PM. 03/09/24   Patsy Lenis, MD  TRESIBA  FLEXTOUCH 100 UNIT/ML FlexTouch Pen Inject 40-46 Units into the skin See admin instructions. Inject 40 units into the skin in the morning and 46 units at bedtime    [provider]  NAPOLEON INHUB 250-50 MCG/ACT AEPB Inhale 2 puffs into the lungs in the morning. 04/20/23   [provider]    Allergies: Buprenorphine hcl, Hydromorphone , Mirtazapine, Codeine, and Morphine  and codeine    Review of Systems  Cardiovascular:  Positive for chest pain.  Musculoskeletal:  Positive for arthralgias and myalgias.  All other systems reviewed and are  negative.   Updated Vital Signs BP 127/87   Pulse 96   Temp 98.5 F (36.9 C) (Oral)   Resp (!) 21   Ht 5' 11 (1.803 m)   Wt 126.6 kg   SpO2 100%   BMI 38.91 kg/m   Physical Exam Vitals and nursing note reviewed.  Constitutional:      General: He is not in acute distress.    Appearance: Normal appearance. He is obese. He is not ill-appearing, toxic-appearing or diaphoretic.  HENT:     Head: Normocephalic and atraumatic.     Comments: No racoon eyes No battle sign Eyes:     Extraocular Movements: Extraocular movements intact.     Pupils: Pupils are equal, round, and reactive to light.  Neck:     Comments: TTP noted  throughout the neck area and surrounding musculature.  No step-offs, lesions, deformity, or overlying skin changes.  C collar placed. Cardiovascular:     Rate and Rhythm: Normal rate. Rhythm irregular.     Heart sounds: Normal heart sounds.     Comments: No tenderness to palpation of the anterior chest wall Pulmonary:     Effort: Pulmonary effort is normal. No respiratory distress.     Breath sounds: Normal breath sounds.  Abdominal:     Palpations: Abdomen is soft.     Tenderness: There is no abdominal tenderness.     Comments: No abdominal tenderness or bruising  Musculoskeletal:        General: Normal range of motion.     Cervical back: Normal and normal range of motion.     Thoracic back: Normal.     Lumbar back: Normal.     Right lower leg: No tenderness. No edema.     Left lower leg: No tenderness. No edema.     Comments: No midline tenderness, no stepoffs or deformity noted on palpation of cervical, thoracic, and lumbar spine  Skin tear noted to the left anterior forearm. No active bleeding  TTP noted to the left hip, no deformity, external rotation, or shortening. DP and PT pulses palpable  Skin:    General: Skin is warm and dry.  Neurological:     General: No focal deficit present.     Mental Status: He is alert and oriented to person, place,  and time.  Psychiatric:        Mood and Affect: Mood normal.        Behavior: Behavior normal.     (all labs ordered are listed, but only abnormal results are displayed) Labs Reviewed  CBC - Abnormal; Notable for the following components:      Result Value   WBC 11.1 (*)    Hemoglobin 10.2 (*)    HCT 35.1 (*)    MCV 74.4 (*)    MCH 21.6 (*)    MCHC 29.1 (*)    RDW 18.0 (*)    All other components within normal limits  PROTIME-INR - Abnormal; Notable for the following components:   Prothrombin Time 16.8 (*)    INR 1.3 (*)    All other components within normal limits  COMPREHENSIVE METABOLIC PANEL WITH GFR - Abnormal; Notable for the following components:   Glucose, Bld 188 (*)    BUN 45 (*)    Creatinine, Ser 1.75 (*)    GFR, Estimated 40 (*)    All other components within normal limits  I-STAT CHEM 8, ED - Abnormal; Notable for the following components:   BUN 43 (*)    Creatinine, Ser 1.90 (*)    Glucose, Bld 195 (*)    Hemoglobin 10.9 (*)    HCT 32.0 (*)    All other components within normal limits  TROPONIN T, HIGH SENSITIVITY - Abnormal; Notable for the following components:   Troponin T High Sensitivity 30 (*)    All other components within normal limits  URINALYSIS, ROUTINE W REFLEX MICROSCOPIC    EKG: None  Radiology: CT CERVICAL SPINE WO CONTRAST Result Date: 06/17/2024 EXAM: CT CERVICAL SPINE WITHOUT CONTRAST 06/17/2024 08:06:04 PM TECHNIQUE: CT of the cervical spine was performed without the administration of intravenous contrast. Multiplanar reformatted images are provided for review. Automated exposure control, iterative reconstruction, and/or weight based adjustment of the mA/kV was utilized to reduce the radiation dose to as low as reasonably  achievable. COMPARISON: 05/09/2024 CLINICAL HISTORY: Polytrauma, blunt. FINDINGS: BONES AND ALIGNMENT: No acute fracture or traumatic malalignment. Prior ACDF from C5 to C7. DEGENERATIVE CHANGES: Diffuse degenerative  facet disease. SOFT TISSUES: No prevertebral soft tissue swelling. IMPRESSION: 1. No evidence of acute traumatic injury. Electronically signed by: Franky Crease MD 06/17/2024 08:42 PM EST RP Workstation: [id number]   CT HEAD WO CONTRAST Result Date: 06/17/2024 EXAM: CT HEAD WITHOUT 06/17/2024 08:06:04 PM TECHNIQUE: CT of the head was performed without the administration of intravenous contrast. Automated exposure control, iterative reconstruction, and/or weight based adjustment of the mA/kV was utilized to reduce the radiation dose to as low as reasonably achievable. COMPARISON: 05/09/2024 CLINICAL HISTORY: Head trauma, moderate-severe. FINDINGS: BRAIN AND VENTRICLES: No acute intracranial hemorrhage. No mass effect or midline shift. No extra-axial fluid collection. No evidence of acute infarct. No hydrocephalus. ORBITS: No acute abnormality. SINUSES AND MASTOIDS: No acute abnormality. SOFT TISSUES AND SKULL: No acute skull fracture. No acute soft tissue abnormality. IMPRESSION: 1. No acute intracranial abnormality. Electronically signed by: Franky Crease MD 06/17/2024 08:37 PM EST RP Workstation: [id number]   DG Pelvis Portable Result Date: 06/17/2024 EXAM: 1 or 2 VIEW(S) XRAY OF THE PELVIS 06/17/2024 07:57:00 PM COMPARISON: None available. CLINICAL HISTORY: Trauma due to a fall. FINDINGS: BONES AND JOINTS: No acute fracture. No malalignment. SOFT TISSUES: The soft tissues are unremarkable. IMPRESSION: 1. No evidence of acute traumatic injury. Electronically signed by: Elsie Gravely MD 06/17/2024 08:08 PM EST RP Workstation: [id number]   DG Chest Port 1 View Result Date: 06/17/2024 EXAM: 1 VIEW(S) XRAY OF THE CHEST 06/17/2024 07:57:00 PM COMPARISON: Comparison with 05/25/2024. CLINICAL HISTORY: Trauma due to a fall. FINDINGS: LINES, TUBES AND DEVICES: Cardiac pacemaker. LUNGS AND PLEURA: Infiltration or atelectasis in the left base with probable small left pleural effusion. The right lung is clear. No vascular  congestion or edema. No pneumothorax. HEART AND MEDIASTINUM: Cardiac enlargement. Mediastinal contours appear intact. BONES AND SOFT TISSUES: Postoperative changes in the cervical spine. LIMITATIONS/ARTIFACTS: Shallow inspiration. AP technique. IMPRESSION: 1. Infiltration or atelectasis in the left base with probable small left pleural effusion. 2. No pneumothorax. 3. Mediastinal contours appear intact. Electronically signed by: Elsie Gravely MD 06/17/2024 08:07 PM EST RP Workstation: [id number]     Procedures   Medications Ordered in the ED  iohexol  (OMNIPAQUE ) 350 MG/ML injection 75 mL (75 mLs Intravenous Contrast Given 06/17/24 2008)                                    Medical Decision Making Amount and/or Complexity of Data Reviewed Labs: ordered. Radiology: ordered.  Risk Prescription drug management.   This patient is a 74 y.o. male who presents to the ED for concern of fall on Xarelto , this involves an extensive number of treatment options, and is a complaint that carries with it a high risk of complications and morbidity. The emergent differential diagnosis prior to evaluation includes, but is not limited to,  trauma, CVA, spinal cord injury, ACS, arrhythmia, syncope, orthostatic hypotension, sepsis, hypoglycemia, hypoxia, electrolyte disturbance, endocrine disorder, anemia, environmental exposure, polypharmacy   This is not an exhaustive differential.   Past Medical History / Co-morbidities / Social History:  has a past medical history of Asthma, Atrial fibrillation (HCC), Bipolar affective (HCC), Cataract, CHF (congestive heart failure) (HCC), Conversion disorder, COPD (chronic obstructive pulmonary disease) (HCC), Cornea disorder, Diabetes mellitus, Gallstones, High cholesterol, History of methicillin resistant staphylococcus aureus (MRSA), Hypertension, Reflux,  Renal disorder, and Sleep apnea.  Additional history: Chart reviewed. Pertinent results include: TEE in 09/25 shows  EF 35-40%. Has AV node ablation with Dr. Almetta on 01/08  Physical Exam: Physical exam performed. The pertinent findings include: TTP noted throughout the neck area and surrounding musculature.  No step-offs, lesions, deformity, or overlying skin changes.  C collar placed.  Lab Tests: I ordered, and personally interpreted labs.  The pertinent results include:  WBC 11.1, glucose 188, creatinine 1.75 (1.35 on last check 4 weeks ago), troponin 30 --> 26   Imaging Studies: I ordered imaging studies including CXR, DG pelvis, CT head, cervical spine, chest abdomen pelvis, t spine, l spine. I independently visualized and interpreted imaging which showed   CXR: 1. Infiltration or atelectasis in the left base with probable small left pleural effusion. 2. No pneumothorax. 3. Mediastinal contours appear intact.  DG pelvis: No evidence of acute traumatic injury.  CT head: No acute intracranial abnormality.   CT cervical spine: No evidence of acute traumatic injury.   CT chest abdomen pelvis: 1. No CT evidence for acute intrathoracic, intra-abdominal, or intrapelvic abnormality. 2. Mild peribronchovascular ground-glass disease at the lingula and lower lobes, suspect infectious or inflammatory in etiology. 3. atherosclerosis.  CT t spine: 1. Moderate chronic compression fracture of T10 with minimal 2 mm retropulsion of the upper posterior vertebral body. 2. Mild chronic compression deformities at T3 and T8. 3. No acute osseous abnormality  CT l spine: 1. No acute osseous abnormality. 2. Chronic superior endplate deformity at L3 with Schmorl's node. Chronic inferior endplate Schmorl's node at L2. 3. Multilevel degenerative changes.  I agree with the radiologist interpretation.   Cardiac Monitoring:  The patient was maintained on a cardiac monitor.  My attending physician Dr. Gennaro viewed and interpreted the cardiac monitored which showed an underlying rhythm of: afib consistent with  chronic afib. I agree with this interpretation.   Medications: I ordered medication including tdap  for trauma.    Disposition: After consideration of the diagnostic results and the patients response to treatment, I feel that emergency department workup does not suggest an emergent condition requiring admission or immediate intervention beyond what has been performed at this time. The plan is: Discharge with close outpatient follow-up and return precautions.  Patient's workup is overall benign, he did have some ground glass disease in the lower lobes of his lungs, and leukocytosis of 11 concerning for developing infection, given this with patient's described weakness recently I did offer admission with IV antibiotics (Curb 65 score 2) and monitoring.  Patient declined this, he would prefer to go home.  Will prescribe doxycycline . Evaluation and diagnostic testing in the emergency department does not suggest an emergent condition requiring admission or immediate intervention beyond what has been performed at this time.  Plan for discharge with close PCP follow-up.  Patient is understanding and amenable with plan, educated on red flag symptoms that would prompt immediate return.  Patient discharged in stable condition.   This is a shared visit with supervising physician Dr. Gennaro who has independently evaluated patient & provided guidance in evaluation/management/disposition, in agreement with care   Final diagnoses:  Fall, initial encounter  Pneumonia due to infectious organism, unspecified laterality, unspecified part of lung    ED Discharge Orders          Ordered    doxycycline  (VIBRAMYCIN ) 100 MG capsule  2 times daily        06/18/24 0016  An After Visit Summary was printed and given to the patient.      Nora Lauraine LABOR, PA-C 06/18/24 0022  "

## 2024-06-17 NOTE — Progress Notes (Signed)
 Orthopedic Tech Progress Note Patient Details:  Mike Hunter 1950/10/17 [id number]  Patient ID: Alm BROCKS Ledonne, male   DOB: [birth year], 74 y.o.   MRN: [id number] Respond to level 2 trauma. Adine MARLA Blush 06/17/2024, 8:00 PM

## 2024-06-17 NOTE — ED Notes (Signed)
 Skin tear noted to the R forearm and R shin

## 2024-06-17 NOTE — ED Provider Notes (Incomplete)
 " [facility] Provider Note   CSN: [id number] Arrival date & time: 06/17/24  1911     Patient presents with: Chest Pain and Mike Hunter is a 74 y.o. male.  {Add pertinent medical, surgical, social history, OB history to YEP:67052} Patient with history of chronic A-fib on Xarelto , CHF, COPD, diabetes, morbid obesity, hypertension, OSA, s/p defibrillator placement presents today with complaints of fall.  He reports that throughout the day he has felt progressively more weak and dizzy.  He went to the grocery store and was helping carry groceries into his house, he turned suddenly and felt dizzy and one of his feet got caught behind the other and he lost his balance and fell forward and hit the left side of his head on a cabinet and then fell to the floor on his left hip.  He did not lose consciousness.  He was not able to get up after the fall, EMS was called for transport here.  He reports pain to the left side of his head, his neck, and his left hip.  Reports that he has had some chest pain today, no shortness of breath.  No leg pain or leg swelling. Reports that he has had some episodes of weakness/dizziness recently, was seen by his cardiologist and suspected that his chronic afib was causing these symptoms, has ablation scheduled for 1/08.  The history is provided by the patient. No language interpreter was used.  Chest Pain Fall Associated symptoms include chest pain.       Prior to Admission medications  Medication Sig Start Date End Date Taking? Authorizing Provider  ACETAMINOPHEN  ER PO Take 1,000 mg by mouth every 8 (eight) hours as needed for pain.    [provider]  albuterol  (PROVENTIL  HFA;VENTOLIN  HFA) 108 (90 BASE) MCG/ACT inhaler Inhale 2 puffs into the lungs every 4 (four) hours as needed for wheezing or shortness of breath.    [provider]  atorvastatin  (LIPITOR ) 80 MG tablet Take 80 mg by mouth at  bedtime.    [provider]  azithromycin  (ZITHROMAX ) 250 MG tablet Take 250 mg by mouth every Monday, Wednesday, and Friday. 02/23/24   [provider]  benzonatate  (TESSALON ) 200 MG capsule Take 1 capsule (200 mg total) by mouth 3 (three) times daily as needed for cough. Patient not taking: Reported on 05/10/2024 04/07/24   Barbarann Nest, MD  budesonide  (PULMICORT ) 0.5 MG/2ML nebulizer solution Take 2 mLs by nebulization 2 (two) times daily.    [provider]  clopidogrel  (PLAVIX ) 75 MG tablet Take 1 tablet (75 mg total) by mouth daily. 05/21/24 05/21/25  Gherghe, Costin M, MD  dapagliflozin propanediol (FARXIGA) 10 MG TABS tablet Take 10 mg by mouth daily. Patient not taking: Reported on 05/23/2024    [provider]  donepezil  (ARICEPT ) 10 MG tablet Take 10 mg by mouth at bedtime.    [provider]  doxepin  (SINEQUAN ) 10 MG capsule Take 10-20 mg by mouth at bedtime.    [provider]  insulin  aspart (NOVOLOG  FLEXPEN) 100 UNIT/ML injection Inject 0-13 Units into the skin See admin instructions. Inject 0-13 units into the skin three times a day with meals, PER SLIDING SCALE    [provider]  iron  polysaccharides (NIFEREX) 150 MG capsule Take 150 mg by mouth 2 (two) times daily. 02/25/24   [provider]  isosorbide  mononitrate (IMDUR ) 30 MG 24 hr tablet Take 30 mg by  mouth daily.    [provider]  latanoprost  (XALATAN ) 0.005 % ophthalmic solution Place 1 drop into both eyes at bedtime.      [provider]  losartan  (COZAAR ) 25 MG tablet Take 1 tablet (25 mg total) by mouth daily. 03/09/24   Patsy Lenis, MD  memantine  (NAMENDA ) 10 MG tablet Take 10 mg by mouth 2 (two) times daily.    [provider]  metoprolol  succinate (TOPROL -XL) 50 MG 24 hr tablet Take 1 tablet (50 mg total) by mouth in the morning and at bedtime. Take with or immediately following a meal. 04/11/24   Almetta Donnice LABOR, MD   mirabegron  ER (MYRBETRIQ ) 50 MG TB24 tablet Take 50 mg by mouth daily. 02/09/24 02/03/25  [provider]  montelukast  (SINGULAIR ) 10 MG tablet Take 10 mg by mouth at bedtime.    [provider]  Multiple Vitamin (MULTIVITAMIN WITH MINERALS) TABS tablet Take 1 tablet by mouth daily with breakfast.    [provider]  Multiple Vitamins-Minerals (PRESERVISION AREDS PO) Take 2 capsules by mouth in the morning.    [provider]  omeprazole (PRILOSEC) 40 MG capsule Take 1 capsule by mouth daily after supper. 02/02/17   [provider]  oxybutynin  (DITROPAN -XL) 10 MG 24 hr tablet Take 10 mg by mouth daily.    [provider]  OXYGEN Inhale 3 L into the lungs daily as needed (Shortness of breath, wheezing).    [provider]  rivaroxaban  (XARELTO ) 20 MG TABS tablet Take 1 tablet (20 mg total) by mouth daily with supper. 05/27/24   Almetta Donnice LABOR, MD  sertraline  (ZOLOFT ) 100 MG tablet Take 100 mg by mouth daily.    [provider]  spironolactone  (ALDACTONE ) 25 MG tablet Take 25 mg by mouth daily.    [provider]  Teriparatide  560 MCG/2.24ML SOPN Inject 20 mcg into the skin at bedtime. 02/25/24   [provider]  torsemide  (DEMADEX ) 20 MG tablet Take 1 tablet (20 mg total) by mouth daily. Patient taking differently: Take 20 mg by mouth 2 (two) times daily at 10 AM and 5 PM. 03/09/24   Patsy Lenis, MD  TRESIBA  FLEXTOUCH 100 UNIT/ML FlexTouch Pen Inject 40-46 Units into the skin See admin instructions. Inject 40 units into the skin in the morning and 46 units at bedtime    [provider]  NAPOLEON INHUB 250-50 MCG/ACT AEPB Inhale 2 puffs into the lungs in the morning. 04/20/23   [provider]    Allergies: Buprenorphine hcl, Hydromorphone , Mirtazapine, Codeine, and Morphine  and codeine    Review of Systems  Cardiovascular:  Positive for chest pain.  Musculoskeletal:  Positive for  arthralgias and myalgias.  All other systems reviewed and are negative.   Updated Vital Signs BP 127/87   Pulse 96   Temp 98.5 F (36.9 C) (Oral)   Resp (!) 21   Ht 5' 11 (1.803 m)   Wt 126.6 kg   SpO2 100%   BMI 38.91 kg/m   Physical Exam Vitals and nursing note reviewed.  Constitutional:      General: He is not in acute distress.    Appearance: Normal appearance. He is obese. He is not ill-appearing, toxic-appearing or diaphoretic.  HENT:     Head: Normocephalic and atraumatic.     Comments: No racoon eyes No battle sign Eyes:     Extraocular Movements: Extraocular movements intact.     Pupils: Pupils are equal, round, and reactive to light.  Neck:     Comments: TTP noted throughout the neck area and surrounding musculature.  No step-offs, lesions, deformity, or overlying skin changes.  C collar placed. Cardiovascular:     Rate and Rhythm: Normal rate. Rhythm irregular.     Heart sounds: Normal heart sounds.     Comments: No tenderness to palpation of the anterior chest wall Pulmonary:     Effort: Pulmonary effort is normal. No respiratory distress.     Breath sounds: Normal breath sounds.  Abdominal:     Palpations: Abdomen is soft.     Tenderness: There is no abdominal tenderness.     Comments: No abdominal tenderness or bruising  Musculoskeletal:        General: Normal range of motion.     Cervical back: Normal and normal range of motion.     Thoracic back: Normal.     Lumbar back: Normal.     Right lower leg: No tenderness. No edema.     Left lower leg: No tenderness. No edema.     Comments: No midline tenderness, no stepoffs or deformity noted on palpation of cervical, thoracic, and lumbar spine  Skin tear noted to the left anterior forearm. No active bleeding  TTP noted to the left hip, no deformity, external rotation, or shortening. DP and PT pulses palpable  Skin:    General: Skin is warm and dry.  Neurological:     General: No focal deficit present.      Mental Status: He is alert and oriented to person, place, and time.  Psychiatric:        Mood and Affect: Mood normal.        Behavior: Behavior normal.     (all labs ordered are listed, but only abnormal results are displayed) Labs Reviewed  CBC - Abnormal; Notable for the following components:      Result Value   WBC 11.1 (*)    Hemoglobin 10.2 (*)    HCT 35.1 (*)    MCV 74.4 (*)    MCH 21.6 (*)    MCHC 29.1 (*)    RDW 18.0 (*)    All other components within normal limits  PROTIME-INR - Abnormal; Notable for the following components:   Prothrombin Time 16.8 (*)    INR 1.3 (*)    All other components within normal limits  COMPREHENSIVE METABOLIC PANEL WITH GFR - Abnormal; Notable for the following components:   Glucose, Bld 188 (*)    BUN 45 (*)    Creatinine, Ser 1.75 (*)    GFR, Estimated 40 (*)    All other components within normal limits  I-STAT CHEM 8, ED - Abnormal; Notable for the following components:   BUN 43 (*)    Creatinine, Ser 1.90 (*)    Glucose, Bld 195 (*)    Hemoglobin 10.9 (*)    HCT 32.0 (*)    All other components within normal limits  TROPONIN T, HIGH SENSITIVITY - Abnormal; Notable for the following components:   Troponin T High Sensitivity 30 (*)    All other components within normal limits  URINALYSIS, ROUTINE W REFLEX MICROSCOPIC    EKG: None  Radiology: CT CERVICAL SPINE WO CONTRAST Result Date: 06/17/2024 EXAM: CT CERVICAL SPINE WITHOUT CONTRAST 06/17/2024 08:06:04 PM TECHNIQUE: CT of the cervical spine was performed without the administration of intravenous contrast. Multiplanar reformatted images are provided for review. Automated exposure control, iterative reconstruction, and/or weight based adjustment of the mA/kV was utilized to reduce  the radiation dose to as low as reasonably achievable. COMPARISON: 05/09/2024 CLINICAL HISTORY: Polytrauma, blunt. FINDINGS: BONES AND ALIGNMENT: No acute fracture or traumatic malalignment. Prior  ACDF from C5 to C7. DEGENERATIVE CHANGES: Diffuse degenerative facet disease. SOFT TISSUES: No prevertebral soft tissue swelling. IMPRESSION: 1. No evidence of acute traumatic injury. Electronically signed by: Franky Crease MD 06/17/2024 08:42 PM EST RP Workstation: [id number]   CT HEAD WO CONTRAST Result Date: 06/17/2024 EXAM: CT HEAD WITHOUT 06/17/2024 08:06:04 PM TECHNIQUE: CT of the head was performed without the administration of intravenous contrast. Automated exposure control, iterative reconstruction, and/or weight based adjustment of the mA/kV was utilized to reduce the radiation dose to as low as reasonably achievable. COMPARISON: 05/09/2024 CLINICAL HISTORY: Head trauma, moderate-severe. FINDINGS: BRAIN AND VENTRICLES: No acute intracranial hemorrhage. No mass effect or midline shift. No extra-axial fluid collection. No evidence of acute infarct. No hydrocephalus. ORBITS: No acute abnormality. SINUSES AND MASTOIDS: No acute abnormality. SOFT TISSUES AND SKULL: No acute skull fracture. No acute soft tissue abnormality. IMPRESSION: 1. No acute intracranial abnormality. Electronically signed by: Franky Crease MD 06/17/2024 08:37 PM EST RP Workstation: [id number]   DG Pelvis Portable Result Date: 06/17/2024 EXAM: 1 or 2 VIEW(S) XRAY OF THE PELVIS 06/17/2024 07:57:00 PM COMPARISON: None available. CLINICAL HISTORY: Trauma due to a fall. FINDINGS: BONES AND JOINTS: No acute fracture. No malalignment. SOFT TISSUES: The soft tissues are unremarkable. IMPRESSION: 1. No evidence of acute traumatic injury. Electronically signed by: Elsie Gravely MD 06/17/2024 08:08 PM EST RP Workstation: [id number]   DG Chest Port 1 View Result Date: 06/17/2024 EXAM: 1 VIEW(S) XRAY OF THE CHEST 06/17/2024 07:57:00 PM COMPARISON: Comparison with 05/25/2024. CLINICAL HISTORY: Trauma due to a fall. FINDINGS: LINES, TUBES AND DEVICES: Cardiac pacemaker. LUNGS AND PLEURA: Infiltration or atelectasis in the left base with probable  small left pleural effusion. The right lung is clear. No vascular congestion or edema. No pneumothorax. HEART AND MEDIASTINUM: Cardiac enlargement. Mediastinal contours appear intact. BONES AND SOFT TISSUES: Postoperative changes in the cervical spine. LIMITATIONS/ARTIFACTS: Shallow inspiration. AP technique. IMPRESSION: 1. Infiltration or atelectasis in the left base with probable small left pleural effusion. 2. No pneumothorax. 3. Mediastinal contours appear intact. Electronically signed by: Elsie Gravely MD 06/17/2024 08:07 PM EST RP Workstation: [id number]    {Document cardiac monitor, telemetry assessment procedure when appropriate:32947} Procedures   Medications Ordered in the ED  iohexol  (OMNIPAQUE ) 350 MG/ML injection 75 mL (75 mLs Intravenous Contrast Given 06/17/24 2008)      {Click here for ABCD2, HEART and other calculators REFRESH Note before signing:1}                              Medical Decision Making Amount and/or Complexity of Data Reviewed Labs: ordered. Radiology: ordered.  Risk Prescription drug management.   This patient is a 74 y.o. male who presents to the ED for concern of fall on Xarelto , this involves an extensive number of treatment options, and is a complaint that carries with it a high risk of complications and morbidity. The emergent differential diagnosis prior to evaluation includes, but is not limited to,  trauma, CVA, spinal cord injury, ACS, arrhythmia, syncope, orthostatic hypotension, sepsis, hypoglycemia, hypoxia, electrolyte disturbance, endocrine disorder, anemia, environmental exposure, polypharmacy   This is not an exhaustive differential.   Past Medical History / Co-morbidities / Social History:  has a past medical history of Asthma, Atrial fibrillation (HCC), Bipolar affective (HCC), Cataract, CHF (congestive heart  failure) (HCC), Conversion disorder, COPD (chronic obstructive pulmonary disease) (HCC), Cornea disorder, Diabetes mellitus,  Gallstones, High cholesterol, History of methicillin resistant staphylococcus aureus (MRSA), Hypertension, Reflux, Renal disorder, and Sleep apnea.  Additional history: Chart reviewed. Pertinent results include: TEE in 09/25 shows EF 35-40%. Has AV node ablation with Dr. Almetta on 01/08  Physical Exam: Physical exam performed. The pertinent findings include: TTP noted throughout the neck area and surrounding musculature.  No step-offs, lesions, deformity, or overlying skin changes.  C collar placed.  Lab Tests: I ordered, and personally interpreted labs.  The pertinent results include:  ***   Imaging Studies: I ordered imaging studies including ***. I independently visualized and interpreted imaging which showed ***. I agree with the radiologist interpretation.   Cardiac Monitoring:  The patient was maintained on a cardiac monitor.  My attending physician Dr. PIERRETTE viewed and interpreted the cardiac monitored which showed an underlying rhythm of: ***. I agree with this interpretation.   Medications: I ordered medication including ***  for ***. Reevaluation of the patient after these medicines showed that the patient {resolved/improved/worsened:23923::improved}. I have reviewed the patients home medicines and have made adjustments as needed.  Consultations Obtained: I requested consultation with the ***,  and discussed lab and imaging findings as well as pertinent plan - they recommend: ***   Disposition: After consideration of the diagnostic results and the patients response to treatment, I feel that *** .   ***emergency department workup does not suggest an emergent condition requiring admission or immediate intervention beyond what has been performed at this time. The plan is: ***. The patient is safe for discharge and has been instructed to return immediately for worsening symptoms, change in symptoms or any other concerns.  I discussed this case with my attending physician Dr. PIERRETTE  who cosigned this note including patient's presenting symptoms, physical exam, and planned diagnostics and interventions. Attending physician stated agreement with plan or made changes to plan which were implemented.     {Document critical care time when appropriate  Document review of labs and clinical decision tools ie CHADS2VASC2, etc  Document your independent review of radiology images and any outside records  Document your discussion with family members, caretakers and with consultants  Document social determinants of health affecting pt's care  Document your decision making why or why not admission, treatments were needed:32947:::1}   Final diagnoses:  None    ED Discharge Orders     None        "

## 2024-06-18 MED ORDER — SODIUM CHLORIDE 0.9 % IV SOLN: 2.0000 g | Freq: Once | INTRAVENOUS | Status: DC

## 2024-06-18 MED ORDER — DOXYCYCLINE HYCLATE 100 MG PO CAPS: 100.0000 mg | ORAL_CAPSULE | Freq: Two times a day (BID) | ORAL | 0 refills | Status: AC

## 2024-06-18 MED ORDER — SODIUM CHLORIDE 0.9 % IV SOLN: 500.0000 mg | Freq: Once | INTRAVENOUS | Status: DC

## 2024-06-18 NOTE — Discharge Instructions (Signed)
 As we discussed, your workup in the ER today was overall reassuring for acute findings.  Your imaging was negative for acute findings related to your fall.  It does look like you may have some signs of developing pneumonia in your chest for which we did offer admission to the hospital.  However, given that you feel better and want to go home, I have given you a prescription for doxycycline  which you can fill and take as prescribed in its entirety for management of this.  I recommend that you call your primary care provider to schedule a close follow-up appointment.  Return if development of any new or worsening symptoms.

## 2024-06-18 NOTE — ED Notes (Signed)
 Pt assisted into car with wife driving.

## 2024-06-18 NOTE — ED Notes (Signed)
 Pt ambulatory in room and had minimal assistance with getting dressed.

## 2024-06-21 ENCOUNTER — Ambulatory Visit: Attending: Cardiovascular Disease | Admitting: Cardiovascular Disease

## 2024-06-21 ENCOUNTER — Encounter: Payer: Self-pay | Admitting: Cardiovascular Disease

## 2024-06-21 VITALS — BP 90/66 | HR 104 | Ht 71.0 in | Wt 276.1 lb

## 2024-06-21 DIAGNOSIS — E119 Type 2 diabetes mellitus without complications: Secondary | ICD-10-CM

## 2024-06-21 DIAGNOSIS — J9611 Chronic respiratory failure with hypoxia: Secondary | ICD-10-CM | POA: Diagnosis not present

## 2024-06-21 DIAGNOSIS — R079 Chest pain, unspecified: Secondary | ICD-10-CM

## 2024-06-21 DIAGNOSIS — J449 Chronic obstructive pulmonary disease, unspecified: Secondary | ICD-10-CM

## 2024-06-21 DIAGNOSIS — Z95810 Presence of automatic (implantable) cardiac defibrillator: Secondary | ICD-10-CM | POA: Diagnosis not present

## 2024-06-21 DIAGNOSIS — I25118 Atherosclerotic heart disease of native coronary artery with other forms of angina pectoris: Secondary | ICD-10-CM

## 2024-06-21 DIAGNOSIS — I4811 Longstanding persistent atrial fibrillation: Secondary | ICD-10-CM

## 2024-06-21 DIAGNOSIS — E785 Hyperlipidemia, unspecified: Secondary | ICD-10-CM

## 2024-06-21 DIAGNOSIS — G4733 Obstructive sleep apnea (adult) (pediatric): Secondary | ICD-10-CM

## 2024-06-21 DIAGNOSIS — Z794 Long term (current) use of insulin: Secondary | ICD-10-CM

## 2024-06-21 DIAGNOSIS — I5022 Chronic systolic (congestive) heart failure: Secondary | ICD-10-CM | POA: Diagnosis not present

## 2024-06-21 DIAGNOSIS — N1832 Chronic kidney disease, stage 3b: Secondary | ICD-10-CM

## 2024-06-21 LAB — CBC
Hematocrit: 35.6 % — ABNORMAL LOW (ref 37.5–51.0)
Hemoglobin: 10 g/dL — ABNORMAL LOW (ref 13.0–17.7)
MCH: 21.1 pg — ABNORMAL LOW (ref 26.6–33.0)
MCHC: 28.1 g/dL — ABNORMAL LOW (ref 31.5–35.7)
MCV: 75 fL — ABNORMAL LOW (ref 79–97)
Platelets: 374 x10E3/uL (ref 150–450)
RBC: 4.74 x10E6/uL (ref 4.14–5.80)
RDW: 17.1 % — ABNORMAL HIGH (ref 11.6–15.4)
WBC: 9.7 x10E3/uL (ref 3.4–10.8)

## 2024-06-21 NOTE — Telephone Encounter (Signed)
 Patient recently scheduled for AVN ablation on Jan 8. Noted he had an ED visit on Jan 2. 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 He is being treated for PNA. Per anesthesia guidelines, it is recommended to delay procedure 6-8 weeks.  Dr. Almetta- is patient okay to delay procedure as recommended?

## 2024-06-21 NOTE — Progress Notes (Signed)
 " Cardiology Office Note   Date:  06/21/2024  ID:  ARTYOM STENCEL, DOB [birth year], MRN [id number] PCP: [name], [name], MD  [facility] Providers Cardiologist:  Jerel Balding, MD Electrophysiologist:  [name], MD     History of Present Illness Taylor [name] Mallon is a 74 y.o. male with CAD and previous PCI-LAD (May 2025, overlapping 3.5 x 38 and 2.75 x 48 mm drug-eluting stents), chronic HFrEF due to ischemic cardiomyopathy, history of sustained ventricular tachycardia with secondary prevention ICD implantation [facility] Scientific dual-chamber, implanted September 2025 upgrade to CRT-D 05/25/2024), recurrent episodes of persistent atrial flutter and atrial fibrillation, difficult to rate control, s/p AV node ablation (05/25/2024), functionally bicuspid aortic valve without significant stenosis or regurgitation, PAD with history of left carotid stenting, CKD stage III, diabetes mellitus type 2, COPD with chronic respiratory failure on 3 L oxygen per minute by nasal cannula, OSA on CPAP, HTN, HLP, COPD, dysphagia due to esophageal stricture requiring dilation (05/20/2024),  returning in follow-up after his remote device download shows that he again has atrial fibrillation with RVR due to recurrent conduction through the AV node.   He was seen in the emergency room 06/17/2024 for a fall.  He was carrying groceries in the house when he turned around suddenly felt dizzy, tripped over his own feet and fell with head impact on a cabinet.  He did not have syncope.  While there he was found to have atrial fibrillation with rapid ventricular response.  He was afebrile but had minimal elevation in WBC at 11,000.  Chest x-ray was interpreted as showing vague infiltrate in the left lower lobe but no evidence of heart failure.  He was placed on antibiotics (doxycycline ).  He gets tired/short of breath very easily.  He had to use a wheelchair to get from our front door to the elevator.  He does not use a  wheelchair at home but does use a rollator.  He is scheduled to have a redo ablation of his AV node this coming Thursday, but the triage nurse for the anesthesia team has raised concerns because of the ongoing treatment for pneumonia.  He is accompanied today by his wife.  They have been married for 56 years.  He is a retired [employer] administrator.  He describes himself as bipolar, which is not always a bad thing .  He recently transitioned his cardiology care from [facility] to [facility]  Studies Reviewed EKG Interpretation Date/Time:  Tuesday June 21 2024 13:34:27 EST Ventricular Rate:  104 PR Interval:    QRS Duration:  148 QT Interval:  414 QTC Calculation: 544 R Axis:   71  Text Interpretation: Atrial flutter with variable A-V block with occasional ventricular-paced complexes Right bundle branch block Cannot rule out Inferior infarct , age undetermined When compared with ECG of 17-Jun-2024 19:20, No significant change since last tracing Confirmed by Lizza Huffaker [phone]) on 06/21/2024 1:47:51 PM  Labs, x-ray images, notes from recent emergency room visit. Most recent defibrillator download   Risk Assessment/Calculations  CHA2DS2-VASc Score = 8   This indicates a 10.8% annual risk of stroke. The patient's score is based upon: CHF History: 1 HTN History: 1 Diabetes History: 1 Stroke History: 2 Vascular Disease History: 1 Age Score: 2 Gender Score: 0            Physical Exam VS:  BP 90/66 (BP Location: Left Arm, Patient Position: Sitting, Cuff Size: Large)   Pulse (!) 104   Ht 5'  11 (1.803 m)   Wt 276 lb 1.6 oz (125.2 kg)   SpO2 98%   BMI 38.51 kg/m        Wt Readings from Last 3 Encounters:  06/21/24 276 lb 1.6 oz (125.2 kg)  06/17/24 279 lb (126.6 kg)  05/25/24 275 lb (124.7 kg)    GEN: Well nourished, well developed in no acute distress; severely obese.  Healthy left subclavian defibrillator site NECK: 7 cm elevation in JVD; No carotid bruits CARDIAC:  Irregular rapid rhythm , no murmurs, rubs, gallops RESPIRATORY:  Clear to auscultation without rales, wheezing or rhonchi  ABDOMEN: Soft, non-tender, non-distended EXTREMITIES: Trivial bilateral ankle edema; No deformity   ASSESSMENT AND PLAN Atrial fibrillation with rapid ventricular response: He has failed multiple attempts at rhythm control and has also proven very difficult to rate control.  Has a component of tachycardia cardiomyopathy.  He has had a previous AV node ablation, but unfortunately has regained conduction through the AV node with rapid rates.  The rapid ventricular rates have led to significant decompensation.  It is important for him to undergo his AV node ablation.  He already has an implanted biventricular defibrillator (CRT-D) Chronic HFrEF: Has some very subtle signs of hypervolemia, but probably is at baseline.  He is on treatment with spironolactone , angiotensin receptor blocker, beta-blocker as well as Farxiga.  Low blood pressure precludes additional titration of his heart failure medicine or aggressive diuresis.  He is not benefiting from the CRRT since his atrial fibrillation is so fast. BiV ICD: Recent upgrade from previous conventional ICD.  [employer] device with normal function at recent check. CAD: Denies current problems with angina while on treatment with beta-blockers and long-acting nitrates.  Has 2 overlapping long stents in the LAD artery placed in May 2025.  Not on aspirin  due to full anticoagulation with rivaroxaban .  On appropriate lipid-lowering therapy.  Advised continuation of treatment with clopidogrel  through May 2026, after which we will probably discontinue antiplatelet therapy and continue him only on anticoagulation. OSA: He reports compliance with CPAP and denies daytime hypersomnolence.  His pulmonologist is Dr. Prentice Graven, PA at [facility]. Chronic respiratory failure with hypoxia/COPD: Reports improvement in symptoms after addition of a  new inhaler (fluticasone -salmeterol).  I do not think there is any evidence of pneumonia based on review of his records from the recent ER visit, the chest x-ray and the labs.  More often than not he has an elevated white blood cell count, typically in the 12,000 range over the last several months, so the nominal elevation in WBC at that office visit is not in my opinion prove of an active infection.  He went into the emergency room for a fall not due to respiratory symptoms which actually have been better than average in the last several weeks.  I do not think we should delay his AV node ablation procedure. DM2/HLP: In the setting of severe obesity.  Most recent hemoglobin A1c was in desirable range at 6.7%.  He requires insulin  for euglycemia, in addition to Farxiga.  His most recent LDL in June 2025 was 53, but he has a chronically low HDL.  His endocrinologist is Dr. Lionel Molt at [facility]. CKD3B: Based on most recent labs with creatinine 1.65, GFR is approximately 40, although recently as low as 30.  On Farxiga and spironolactone  and losartan .  Farxiga has been held for the upcoming AV node ablation.       Dispo: I think he should go ahead  with his planned AV node ablation later this week.  Reevaluate left ventricular systolic function with echocardiography in several weeks after resolution of rapid ventricular rates and consistent CRT.  Signed, Jerel Balding, MD   "

## 2024-06-21 NOTE — Telephone Encounter (Signed)
 I saw Mike Hunter in clinic today and tried to clarify the lung situation. He went to the emergency room last week after a fall (stumbled over his own feet and had a fall with head impact).  While in the emergency room incidental findings included a vague infiltrate in his left lower lobe (not ground glass infiltrates as is reported in the ER physicians notes) and a minimally elevated white blood cell count of 11,100. Mike Hunter never reported fever, chills and his cough is better than has been in months after he was started on treatment with fluticasone -salmeterol by his pulmonary specialist.  Nevertheless, he was given a prescription for doxycycline  which he has been taking for the last week.  Over the last 6 months his white blood cell count has been persistently elevated, typically over 11,000  I do not think he had pneumonia last week. He has chronic lung problems and chronic HFrEF, but his dyspnea is at baseline and he remains afebrile. I think it is important that his AV node ablation procedure is performed as scheduled.  Atrial fibrillation rapid ventricular response has been responsible for a lot of his recent problems.

## 2024-06-21 NOTE — Patient Instructions (Addendum)
 Medication Instructions:  Your physician recommends that you continue on your current medications as directed. Please refer to the Current Medication list given to you today.  *If you need a refill on your cardiac medications before your next appointment, please call your pharmacy*  Lab Work: Have lab work (CBC) drawn today in the lab on the first floor If you have labs (blood work) drawn today and your tests are completely normal, you will receive your results only by: MyChart Message (if you have MyChart) OR A paper copy in the mail If you have any lab test that is abnormal or we need to change your treatment, we will call you to review the results.  Testing/Procedures: none  Follow-Up: At [facility], you and your health needs are our priority.  As part of our continuing mission to provide you with exceptional heart care, our providers are all part of one team.  This team includes your primary Cardiologist (physician) and Advanced Practice Providers or APPs (Physician Assistants and Nurse Practitioners) who all work together to provide you with the care you need, when you need it.  Your next appointment:   6 month(s)  Provider:   Jerel Balding, MD    We recommend signing up for the patient portal called MyChart.  Sign up information is provided on this After Visit Summary.  MyChart is used to connect with patients for Virtual Visits (Telemedicine).  Patients are able to view lab/test results, encounter notes, upcoming appointments, etc.  Non-urgent messages can be sent to your provider as well.   To learn more about what you can do with MyChart, go to [email or web address].   Other Instructions

## 2024-06-22 ENCOUNTER — Ambulatory Visit: Payer: Self-pay | Admitting: Cardiovascular Disease

## 2024-06-22 NOTE — Pre-Procedure Instructions (Signed)
 Attempted to call patient regarding procedure instructions.  Left voicemail on the following items: Arrival time 0730 Nothing to eat or drink after midnight No meds AM of procedure Responsible person to drive you home and stay with you for 24 hrs  Have you missed any doses of anti-coagulant Xarelto- should be taken once a day, if you have missed any doses please let us know.

## 2024-06-23 ENCOUNTER — Ambulatory Visit (HOSPITAL_COMMUNITY)
Admission: RE | Admit: 2024-06-23 | Discharge: 2024-06-23 | Disposition: A | Discharge status: Home / Self Care | Attending: Student in an Organized Health Care Education/Training Program | Admitting: Student in an Organized Health Care Education/Training Program

## 2024-06-23 ENCOUNTER — Encounter (HOSPITAL_COMMUNITY): Payer: Self-pay | Admitting: Student in an Organized Health Care Education/Training Program

## 2024-06-23 ENCOUNTER — Other Ambulatory Visit: Payer: Self-pay

## 2024-06-23 ENCOUNTER — Ambulatory Visit (HOSPITAL_COMMUNITY): Admitting: Certified Registered Nurse Anesthetist

## 2024-06-23 ENCOUNTER — Telehealth: Payer: Self-pay

## 2024-06-23 ENCOUNTER — Ambulatory Visit (HOSPITAL_COMMUNITY)
Admission: RE | Disposition: A | Discharge status: Home / Self Care | Payer: Self-pay | Source: Home / Self Care | Attending: Student in an Organized Health Care Education/Training Program

## 2024-06-23 DIAGNOSIS — Z79899 Other long term (current) drug therapy: Secondary | ICD-10-CM | POA: Insufficient documentation

## 2024-06-23 DIAGNOSIS — I5022 Chronic systolic (congestive) heart failure: Secondary | ICD-10-CM | POA: Insufficient documentation

## 2024-06-23 DIAGNOSIS — I130 Hypertensive heart and chronic kidney disease with heart failure and stage 1 through stage 4 chronic kidney disease, or unspecified chronic kidney disease: Secondary | ICD-10-CM | POA: Insufficient documentation

## 2024-06-23 DIAGNOSIS — E1140 Type 2 diabetes mellitus with diabetic neuropathy, unspecified: Secondary | ICD-10-CM | POA: Insufficient documentation

## 2024-06-23 DIAGNOSIS — I4821 Permanent atrial fibrillation: Secondary | ICD-10-CM

## 2024-06-23 DIAGNOSIS — E1122 Type 2 diabetes mellitus with diabetic chronic kidney disease: Secondary | ICD-10-CM | POA: Diagnosis not present

## 2024-06-23 DIAGNOSIS — I255 Ischemic cardiomyopathy: Secondary | ICD-10-CM | POA: Insufficient documentation

## 2024-06-23 DIAGNOSIS — Z7901 Long term (current) use of anticoagulants: Secondary | ICD-10-CM | POA: Diagnosis not present

## 2024-06-23 DIAGNOSIS — I2510 Atherosclerotic heart disease of native coronary artery without angina pectoris: Secondary | ICD-10-CM

## 2024-06-23 DIAGNOSIS — I4819 Other persistent atrial fibrillation: Secondary | ICD-10-CM

## 2024-06-23 DIAGNOSIS — Z87891 Personal history of nicotine dependence: Secondary | ICD-10-CM | POA: Insufficient documentation

## 2024-06-23 DIAGNOSIS — I5033 Acute on chronic diastolic (congestive) heart failure: Secondary | ICD-10-CM

## 2024-06-23 DIAGNOSIS — J4489 Other specified chronic obstructive pulmonary disease: Secondary | ICD-10-CM | POA: Diagnosis not present

## 2024-06-23 DIAGNOSIS — N1831 Chronic kidney disease, stage 3a: Secondary | ICD-10-CM

## 2024-06-23 DIAGNOSIS — Z95810 Presence of automatic (implantable) cardiac defibrillator: Secondary | ICD-10-CM | POA: Insufficient documentation

## 2024-06-23 DIAGNOSIS — E1151 Type 2 diabetes mellitus with diabetic peripheral angiopathy without gangrene: Secondary | ICD-10-CM | POA: Insufficient documentation

## 2024-06-23 DIAGNOSIS — E785 Hyperlipidemia, unspecified: Secondary | ICD-10-CM | POA: Diagnosis not present

## 2024-06-23 DIAGNOSIS — G4733 Obstructive sleep apnea (adult) (pediatric): Secondary | ICD-10-CM | POA: Diagnosis not present

## 2024-06-23 DIAGNOSIS — N183 Chronic kidney disease, stage 3 unspecified: Secondary | ICD-10-CM | POA: Insufficient documentation

## 2024-06-23 HISTORY — PX: AV NODE ABLATION: EP1193

## 2024-06-23 LAB — GLUCOSE, CAPILLARY
Glucose-Capillary: 214 mg/dL — ABNORMAL HIGH (ref 70–99)
Glucose-Capillary: 187 mg/dL — ABNORMAL HIGH (ref 70–99)

## 2024-06-23 MED ORDER — HEPARIN SODIUM (PORCINE) 1000 UNIT/ML IJ SOLN
INTRAMUSCULAR | Status: DC | PRN
  Administered 2024-06-23: 1000 [IU] via INTRAVENOUS

## 2024-06-23 MED ORDER — SODIUM CHLORIDE 0.9% FLUSH: 3.0000 mL | Freq: Two times a day (BID) | INTRAVENOUS | Status: DC

## 2024-06-23 MED ORDER — SODIUM CHLORIDE 0.9 % IV SOLN: INTRAVENOUS | Status: DC

## 2024-06-23 MED ORDER — MIDAZOLAM HCL 2 MG/2ML IJ SOLN
INTRAMUSCULAR | Status: AC
  Filled 2024-06-23: qty 2

## 2024-06-23 MED ORDER — BUPIVACAINE HCL (PF) 0.25 % IJ SOLN
INTRAMUSCULAR | Status: AC
  Filled 2024-06-23: qty 30

## 2024-06-23 MED ORDER — MIDAZOLAM HCL 5 MG/5ML IJ SOLN
INTRAMUSCULAR | Status: DC | PRN
  Administered 2024-06-23 (×3): .5 mg via INTRAVENOUS

## 2024-06-23 MED ORDER — CEFAZOLIN SODIUM-DEXTROSE 3-4 GM/150ML-% IV SOLN
3.0000 g | INTRAVENOUS | Status: AC
  Administered 2024-06-23: 3 g via INTRAVENOUS
  Filled 2024-06-23: qty 150

## 2024-06-23 MED ORDER — HEPARIN (PORCINE) IN NACL 1000-0.9 UT/500ML-% IV SOLN
INTRAVENOUS | Status: DC | PRN
  Administered 2024-06-23: 500 mL

## 2024-06-23 MED ORDER — FENTANYL CITRATE (PF) 100 MCG/2ML IJ SOLN
INTRAMUSCULAR | Status: AC
  Filled 2024-06-23: qty 2

## 2024-06-23 MED ORDER — FENTANYL CITRATE (PF) 250 MCG/5ML IJ SOLN
INTRAMUSCULAR | Status: DC | PRN
  Administered 2024-06-23 (×2): 25 ug via INTRAVENOUS

## 2024-06-23 MED ORDER — HEPARIN SODIUM (PORCINE) 1000 UNIT/ML IJ SOLN
INTRAMUSCULAR | Status: AC
  Filled 2024-06-23: qty 10

## 2024-06-23 MED ORDER — SODIUM CHLORIDE 0.9% FLUSH: 3.0000 mL | INTRAVENOUS | Status: DC | PRN

## 2024-06-23 MED ORDER — SODIUM CHLORIDE 0.9 % IV SOLN: 250.0000 mL | INTRAVENOUS | Status: DC | PRN

## 2024-06-23 MED ORDER — FENTANYL CITRATE (PF) 100 MCG/2ML IJ SOLN: 25.0000 ug | INTRAMUSCULAR | Status: DC | PRN

## 2024-06-23 NOTE — Telephone Encounter (Signed)
-----   Message from Nurse Aldona SAUNDERS, RN sent at 06/17/2024  4:14 PM EST ----- Regarding: AVNA 06/23/24 [place] Important: list procedure date as first item in subject line, followed by procedure type (e.g., 02/26/24 AFib ablation)  Precert:  MD: [place] Type of ablation: AV node Diagnosis:Afib CPT code: AV node (06349) Ablation scheduled (date/time): 06/23/24 930  Procedure:  Added to calendar? Yes Orders entered? Yes Letter complete? Yes Scheduled with cath lab? Yes Any medications to hold?  Hold your Xarelto  and Plavix .  You will take your last dose on Tuesday, June 21, 2024. Per [place] - other meds routine hold Labs ordered (CBC, BMET, PT/INR if on warfarin): Complete Mapping system: CARTO (lab 4 or 6) CARTO/OPAL rep notified? Yes Cardiac CT needed? No Dye allergy? N/A Pre-meds ordered and instructions given? N/A Letter method: MyChart H&P: day of Device: Yes, BiV - ICD BSX  Follow-up:  Cassie/Angel, please schedule Routine.  Covering RN - please send this message to [employer], EP scheduler, EP Scheduling pool, EP [employer], and CT scheduler (Brittany Lynch/Stephanie Mogg), if indicated.

## 2024-06-23 NOTE — Transfer of Care (Signed)
 Immediate Anesthesia Transfer of Care Note  Patient: Mike Hunter  Procedure(s) Performed: AV NODE ABLATION  Patient Location: Cath Lab  Anesthesia Type:MAC  Level of Consciousness: awake, alert , oriented, patient cooperative, and responds to stimulation  Airway & Oxygen Therapy: Patient Spontanous Breathing and Patient connected to nasal cannula oxygen  Post-op Assessment: Report given to RN and Post -op Vital signs reviewed and stable  Post vital signs: Reviewed and stable  Last Vitals:  Vitals Value Taken Time  BP 116/67   Temp    Pulse 70   Resp    SpO2 100     Last Pain:  Vitals:   06/23/24 0800  TempSrc: Oral         Complications: No notable events documented.

## 2024-06-23 NOTE — Anesthesia Postprocedure Evaluation (Signed)
"   Anesthesia Post Note  Patient: Mike Hunter  Procedure(s) Performed: AV NODE ABLATION     Patient location during evaluation: PACU Anesthesia Type: MAC Level of consciousness: awake and alert Pain management: pain level controlled Vital Signs Assessment: post-procedure vital signs reviewed and stable Respiratory status: spontaneous breathing, nonlabored ventilation and respiratory function stable Cardiovascular status: blood pressure returned to baseline and stable Postop Assessment: no apparent nausea or vomiting Anesthetic complications: no   There were no known notable events for this encounter.  Last Vitals:  Vitals:   06/23/24 1530 06/23/24 1600  BP: 120/68 101/68  Pulse: 70 70  Resp: 14 17  Temp:    SpO2: 100% 100%    Last Pain:  Vitals:   06/23/24 1420  TempSrc: Oral  PainSc: 7                  [address] [place]      "

## 2024-06-23 NOTE — Interval H&P Note (Signed)
 History and Physical Interval Note:  06/23/2024 12:11 PM  Mike Hunter  has presented today for surgery, with the diagnosis of Atrial Fibrillation.  The various methods of treatment have been discussed with the patient and family. After consideration of risks, benefits and other options for treatment, the patient has consented to  Procedures: AV NODE ABLATION (N/A) as a surgical intervention.  The patient's history has been reviewed, patient examined, no change in status, stable for surgery.  I have reviewed the patient's chart and labs.  Questions were answered to the patient's satisfaction.     [name]

## 2024-06-23 NOTE — CV Procedure (Signed)
" ° °  Procedure:  Intracardiac catheter ablation of the AV node (CPT 93650) 3-Dimensional electroanatomic mapping (CPT [id number]) Ultrasound access (CPT [phone]) x 1  Pre-Op  Diagnosis: permanent AF/RVR, HFrEF  Post-Op Diagnosis: same   Procedure Date: 06/23/2024   Attending: Adina Primus, MD  Anesthesia: MAC  Initial Intervals: AF/RVR and intermittent BVP, QRS 97, QT 365, RR 574  Procedure: The patient entered the EP lab in a fasting nonsedated state. The surgical time-out was achieved. The patient was placed under moderate sedation by anesthesia. The patient was draped and prepped in normal fashion with multiple layers of Hibiclens  scrub in the right groin. Access to the vein was achieved in the following manner: using ultrasound, the right femoral vein was visualized to be widely patent.  During real-time ultrasound, the right femoral vein was visualized and the micropunture needle was seen directly puncturing the vein. Using the modified Seldinger technique, an 8.5-French medium curl Vizigo catheter was placed. Ultrasound images from vascular access were recorded and retained on the system.   Mapping: A Thermocool ST/SF D/F 3.5 mm irrigated-tip ablation catheter  was advanced into the right atrium through the Vizigo sheath.  An electroanatomic map was constructed of the RA, tricuspid annulus and the His bundle was located and marked on the map. The catheter was withdrawn posteriorly and inferiorly to target ablation at the compact AV node.    Ablation: RFA at 35-45 W was performed with complete heart block during RF and AF-BVP. The patient was observed for 30 minutes with no evidence of A-V conduction at pacing lower rate of 40 bpm.    Catheters and sheaths were pulled and hemostasis obtained with Perclose ProStyle sutures. No complications were evident. The patient was transported to post-op holding for recovery.    Final Intervals: AF-BVP, QRS 83, QT 380, RR 855 EBL: less than 50 mL   Complications: none  Disposition: CVSSU observation. Summary: AF on arrival   Successful AVNA  Recommendations: Bedrest x 2 hours Anti-coagulation: Resume Xarelto  tonight Anti-platelet: none Anti-arrhythmic: none Rate control: none EP f/u to be scheduled  [name], MD [facility]  Cardiac Electrophysiology        "

## 2024-06-23 NOTE — Anesthesia Preprocedure Evaluation (Addendum)
 "                                  Anesthesia Evaluation  Patient identified by MRN, date of birth, ID band Patient awake    Reviewed: Allergy & Precautions, NPO status , Patient's Chart, lab work & pertinent test results  Airway Mallampati: II  TM Distance: >3 FB Neck ROM: Full    Dental no notable dental hx.    Pulmonary asthma , sleep apnea , COPD, former smoker   Pulmonary exam normal        Cardiovascular hypertension, Pt. on medications and Pt. on home beta blockers + CAD and +CHF  + dysrhythmias Atrial Fibrillation  Rhythm:Regular Rate:Normal  ECHO 09/25: IMPRESSIONS   1. Left ventricular ejection fraction, by estimation, is 35 to 40%. The  left ventricle has moderately decreased function. The left ventricle  demonstrates global hypokinesis. Left ventricular diastolic function could  not be evaluated.   2. Right ventricular systolic function is mildly reduced. The right  ventricular size is normal. The estimated right ventricular systolic  pressure is 35.0 mmHg.   3. Left atrial size was moderately dilated. No left atrial/left atrial  appendage thrombus was detected. The LAA emptying velocity was 23 cm/s.   4. Right atrial size was mildly dilated.   5. The mitral valve is normal in structure. Trivial mitral valve  regurgitation. No evidence of mitral stenosis.   6. Possibly a functionally bicuspid aortic valve. There is a calcified  raphe between the left and noncoronary cusps. The aortic valve is  abnormal. There is mild calcification of the aortic valve. There is mild  thickening of the aortic valve. Aortic  valve regurgitation is not visualized. Aortic valve  sclerosis/calcification is present, without any evidence of aortic  stenosis.   7. There is Moderate (Grade III) plaque involving the descending aort     Neuro/Psych   Anxiety Depression Bipolar Disorder  Dementia    GI/Hepatic Neg liver ROS,GERD  ,,  Endo/Other  diabetes, Type 2, Insulin   Dependent    Renal/GU Renal disease  negative genitourinary   Musculoskeletal  (+) Arthritis , Osteoarthritis,    Abdominal Normal abdominal exam  (+)   Peds  Hematology  (+) Blood dyscrasia, anemia Lab Results      Component                Value               Date                      WBC                      9.7                 06/21/2024                HGB                      10.0 (L)            06/21/2024                HCT                      35.6 (L)  06/21/2024                MCV                      75 (L)              06/21/2024                PLT                      374                 06/21/2024             Lab Results      Component                Value               Date                      NA                       141                 06/17/2024                K                        4.0                 06/17/2024                CO2                      24                  06/17/2024                GLUCOSE                  195 (H)             06/17/2024                BUN                      43 (H)              06/17/2024                CREATININE               1.90 (H)            06/17/2024                CALCIUM                   9.2                 06/17/2024                EGFR                     43 (L)              04/29/2024                GFRNONAA  40 (L)              06/17/2024              Anesthesia Other Findings   Reproductive/Obstetrics                              Anesthesia Physical Anesthesia Plan  ASA: 4  Anesthesia Plan: MAC   Post-op Pain Management:    Induction: Intravenous  PONV Risk Score and Plan: 2 and Ondansetron , Dexamethasone , Treatment may vary due to age or medical condition and Propofol  infusion  Airway Management Planned:   Additional Equipment: ClearSight  Intra-op Plan:   Post-operative Plan:   Informed Consent: I have reviewed the patients History and  Physical, chart, labs and discussed the procedure including the risks, benefits and alternatives for the proposed anesthesia with the patient or authorized representative who has indicated his/her understanding and acceptance.     Dental advisory given  Plan Discussed with: CRNA  Anesthesia Plan Comments:          Anesthesia Quick Evaluation  "

## 2024-06-23 NOTE — Progress Notes (Signed)
 Pt and wife received discharge instructions, teach back performed. Iv removed, no complications. Rt fem site is clean dry intact, site is soft, no signs of bleeding. Pt escorted out via wheelchair to wife's vehicle.

## 2024-06-23 NOTE — H&P (Signed)
 " Cardiology Consultation:   Patient ID: Mike Hunter MRN: [id number]; DOB: 1951/04/15  Admit date: 06/23/2024 Date of Consult: 06/23/2024  Primary Care Provider: [name], [name], MD [facility] Cardiologist: Jerel Balding, MD  [facility] Electrophysiologist:  [name], MD   Patient Profile:   Mike Hunter is a 74 y.o. male with CAD s/p LAD PCI (10/2023), HFrEF/ICM, sustained VT s/p 2' prevention LEFT BSCI DC ICD (DOI 03/01/24) with subsequent device upgrade to CRTD (05/25/24) and AVNA, PVD s/p L ICA (05/2023), HTN, HLD, COPD, OSA on CPAP, neuropathy, DM2, BPD, CKD3, and HPpEF who presents for redo AVNA.   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   High cholesterol    History of methicillin resistant staphylococcus aureus (MRSA)    Hypertension    Reflux    Renal disorder    Sleep apnea    Past Surgical History:  Procedure Laterality Date   AV NODE ABLATION N/A 05/25/2024   Procedure: AV NODE ABLATION;  Surgeon: Primus Donnice DELENA, MD;  Location: [facility] INVASIVE CV LAB;  Service: Cardiovascular;  Laterality: N/A;   BIV ICD GENERATOR CHANGEOUT N/A 05/25/2024   Procedure: BIV ICD GENERATOR CHANGEOUT;  Surgeon: Primus Donnice DELENA, MD;  Location: [facility] INVASIVE CV LAB;  Service: Cardiovascular;  Laterality: N/A;   BIV ICD INSERTION CRT-D N/A 05/25/2024   Procedure: BIV ICD INSERTION CRT-D;  Surgeon: Primus Donnice DELENA, MD;  Location: [facility] INVASIVE CV LAB;  Service: Cardiovascular;  Laterality: N/A;   CARDIOVERSION N/A 08/26/2023   Procedure: CARDIOVERSION;  Surgeon: Barbaraann Darryle Ned, MD;  Location: [facility] INVASIVE CV LAB;  Service: Cardiovascular;  Laterality: N/A;   CARDIOVERSION N/A 02/26/2024    Procedure: CARDIOVERSION;  Surgeon: Lonni Slain, MD;  Location: [facility] INVASIVE CV LAB;  Service: Cardiovascular;  Laterality: N/A;   CARDIOVERSION N/A 03/04/2024   Procedure: CARDIOVERSION;  Surgeon: Balding Jerel, MD;  Location: [facility] INVASIVE CV LAB;  Service: Cardiovascular;  Laterality: N/A;   CATARACT EXTRACTION     CERVICAL FUSION     CHOLECYSTECTOMY     ESOPHAGOGASTRODUODENOSCOPY N/A 05/20/2024   Procedure: EGD (ESOPHAGOGASTRODUODENOSCOPY);  Surgeon: Nandigam, Kavitha V, MD;  Location: [facility] ENDOSCOPY;  Service: Gastroenterology;  Laterality: N/A;  With esophageal dilation   ICD IMPLANT N/A 03/01/2024   Procedure: ICD IMPLANT;  Surgeon: Kennyth Chew, MD;  Location: [facility] INVASIVE CV LAB;  Service: Cardiovascular;  Laterality: N/A;   KIDNEY STONE SURGERY     KNEE ARTHROSCOPY     TRANSESOPHAGEAL ECHOCARDIOGRAM (CATH LAB) N/A 03/04/2024   Procedure: TRANSESOPHAGEAL ECHOCARDIOGRAM;  Surgeon: Balding Jerel, MD;  Location: [facility] INVASIVE CV LAB;  Service: Cardiovascular;  Laterality: N/A;    Inpatient Medications: Scheduled Meds:  Continuous Infusions:  sodium chloride  40 mL/hr at 06/23/24 0812   PRN Meds:  Allergies:   Allergies[1]  Social History:   Social History   Socioeconomic History   Marital status: Married    Spouse name: Not on file   Number of children: Not on file   Years of education: Not on file   Highest education level: Not on file  Occupational History   Not on file  Tobacco  Use   Smoking status: Former   Smokeless tobacco: Never  Vaping Use   Vaping status: Never Used  Substance and Sexual Activity   Alcohol use: No   Drug use: No   Sexual activity: Not on file  Other Topics Concern   Not on file  Social History Narrative   Not on file   Social Drivers of Health   Tobacco Use: Medium Risk (06/23/2024)   Patient History    Smoking Tobacco Use: Former    Smokeless Tobacco Use: Never    Passive Exposure: Not on Actuary Strain:  Not on file  Food Insecurity: Low Risk (06/02/2024)   Received from [facility]   Epic    Within the past 12 months, you worried that your food would run out before you got money to buy more: Never true    Within the past 12 months, the food you bought just didn't last and you didn't have money to get more. : Never true  Transportation Needs: No Transportation Needs (06/02/2024)   Received from [employer]    In the past 12 months, has lack of reliable transportation kept you from medical appointments, meetings, work or from getting things needed for daily living? : No  Physical Activity: Not on file  Stress: Not on file  Social Connections: Socially Integrated (05/10/2024)   Social Connection and Isolation Panel    Frequency of Communication with Friends and Family: Three times a week    Frequency of Social Gatherings with Friends and Family: Three times a week    Attends Religious Services: More than 4 times per year    Active Member of Clubs or Organizations: Yes    Attends Banker Meetings: 1 to 4 times per year    Marital Status: Married  Catering Manager Violence: Not At Risk (05/10/2024)   Epic    Fear of Current or Ex-Partner: No    Emotionally Abused: No    Physically Abused: No    Sexually Abused: No  Depression (PHQ2-9): Not on file  Alcohol Screen: Not on file  Housing: Low Risk (06/02/2024)   Received from [facility]   Epic    What is your living situation today?: I have a steady place to live    Think about the place you live. Do you have problems with any of the following? Choose all that apply:: None/None on this list  Utilities: Low Risk (06/02/2024)   Received from [facility]   Utilities    In the past 12 months has the electric, gas, oil, or water company threatened to shut off services in your home? : No  Health Literacy: Not on file    ROS:  Review of Systems: [y] = yes, [ ]  = no      General: Weight gain [ ] ;  Weight loss [ ] ; Anorexia [ ] ; Fatigue [ ] ; Fever [ ] ; Chills [ ] ; Weakness [ ]    Cardiac: Chest pain/pressure [ ] ; Resting SOB [ ] ; Exertional SOB [ ] ; Orthopnea [ ] ; Pedal Edema [ ] ; Palpitations [y]; Syncope [ ] ; Presyncope [ ] ; Paroxysmal nocturnal dyspnea [ ]    Pulmonary: Cough [ ] ; Wheezing [ ] ; Hemoptysis [ ] ; Sputum [ ] ; Snoring [ ]    GI: Vomiting [ ] ; Dysphagia [ ] ; Melena [ ] ; Hematochezia [ ] ; Heartburn [ ] ; Abdominal pain [ ] ; Constipation [ ] ; Diarrhea [ ] ; BRBPR [ ]    GU: Hematuria [ ] ;  Dysuria [ ] ; Nocturia [ ]  Vascular: Pain in legs with walking [ ] ; Pain in feet with lying flat [ ] ; Non-healing sores [ ] ; Stroke [ ] ; TIA [ ] ; Slurred speech [ ] ;   Neuro: Headaches [ ] ; Vertigo [ ] ; Seizures [ ] ; Paresthesias [ ] ;Blurred vision [ ] ; Diplopia [ ] ; Vision changes [ ]    Ortho/Skin: Arthritis [ ] ; Joint pain [ ] ; Muscle pain [ ] ; Joint swelling [ ] ; Back Pain [ ] ; Rash [ ]    Psych: Depression [ ] ; Anxiety [ ]    Heme: Bleeding problems [ ] ; Clotting disorders [ ] ; Anemia [ ]    Endocrine: Diabetes [ ] ; Thyroid dysfunction [ ]    Physical Exam/Data:   Vitals:   06/23/24 0800  BP: 109/68  Pulse: (!) 110  Resp: 20  Temp: 97.9 F (36.6 C)  TempSrc: Oral  SpO2: 100%  Weight: 126.6 kg  Height: 5' 11 (1.803 m)   No intake or output data in the 24 hours ending 06/23/24 1208    06/23/2024    8:00 AM 06/21/2024    1:38 PM 06/17/2024    7:22 PM  Last 3 Weights  Weight (lbs) 279 lb 276 lb 1.6 oz 279 lb  Weight (kg) 126.554 kg 125.238 kg 126.554 kg     Body mass index is 38.91 kg/m.  Exam performed by me: Gen: stable, conversant, mildly dyspnic Neck: JVC difficult to assess with obesity  Cardiac: irregular rhythm, rate controlled, no M/G/R Lungs: CTAB Extremities: no LE edema  Laboratory Data:  High Sensitivity Troponin:  No results for input(s): TROPONINIHS in the last 720 hours.   Chemistry Recent Labs  Lab 06/17/24 1937 06/17/24 1958  NA 140 141  K 4.1 4.0  CL 102  104  CO2 24  --   GLUCOSE 188* 195*  BUN 45* 43*  CREATININE 1.75* 1.90*  CALCIUM  9.2  --   GFRNONAA 40*  --   ANIONGAP 14  --     Recent Labs  Lab 06/17/24 1937  PROT 7.9  ALBUMIN  4.1  AST 33  ALT 39  ALKPHOS 113  BILITOT 0.4   Hematology Recent Labs  Lab 06/17/24 1937 06/17/24 1958 06/21/24 1438  WBC 11.1*  --  9.7  RBC 4.72  --  4.74  HGB 10.2* 10.9* 10.0*  HCT 35.1* 32.0* 35.6*  MCV 74.4*  --  75*  MCH 21.6*  --  21.1*  MCHC 29.1*  --  28.1*  RDW 18.0*  --  17.1*  PLT 331  --  374   Assessment and Plan:  Mike Hunter is a 74 y.o. male with CAD s/p LAD PCI (10/2023), HFrEF/ICM, sustained VT s/p 2' prevention LEFT BSCI DC ICD (DOI 03/01/24) with subsequent device upgrade to CRTD (05/25/24) and AVNA, PVD s/p L ICA (05/2023), HTN, HLD, COPD, OSA on CPAP, neuropathy, DM2, BPD, CKD3, and HPpEF who presents for redo AVNA.    My Assessment and Plan: Mike Hunter is a 74 y.o. male with CAD s/p LAD PCI (10/2023), HFrEF/ICM, sustained VT s/p 2' prevention LEFT BSCI DC ICD (DOI 03/01/24), PVD s/p L ICA (05/2023), HTN, HLD, COPD, OSA on CPAP, neuropathy, DM2, BPD, CKD3, and HPpEF who presents for arrhythmia management.    Pemanent AF/AFL HFrEF LEFT BSCI CRTD Proceed with redo AVNA today with recovery of AVN conduction over the past month. Held Xarelto  since 06/21/24.   Signed, [name], MD  06/23/2024 12:08 PM     [1]  Allergies Allergen Reactions   Buprenorphine Hcl Nausea And Vomiting   Hydromorphone  Other (See Comments)    Mental Status Changes/HALLUCINATIONS   Mirtazapine Other (See Comments)    Nightmares & altered mental status    Codeine Nausea And Vomiting   Morphine  And Codeine Nausea And Vomiting   "

## 2024-06-23 NOTE — Discharge Instructions (Signed)

## 2024-06-24 ENCOUNTER — Telehealth (HOSPITAL_COMMUNITY): Payer: Self-pay

## 2024-06-24 ENCOUNTER — Encounter (HOSPITAL_COMMUNITY): Payer: Self-pay | Admitting: Student in an Organized Health Care Education/Training Program

## 2024-06-24 MED FILL — Fentanyl Citrate Preservative Free (PF) Inj 100 MCG/2ML: INTRAMUSCULAR | Qty: 1 | Status: AC

## 2024-06-24 MED FILL — Bupivacaine HCl Preservative Free (PF) Inj 0.25%: INTRAMUSCULAR | Qty: 30 | Status: AC

## 2024-06-24 MED FILL — Midazolam HCl Inj 2 MG/2ML (Base Equivalent): INTRAMUSCULAR | Qty: 1.5 | Status: AC

## 2024-06-24 NOTE — Telephone Encounter (Signed)
 Spoke with patient to complete post procedure follow up call.  Patient reports no complications with groin sites.   Instructions reviewed with patient:  Remove large bandage at puncture site after 24 hours. It is normal to have bruising, tenderness, mild swelling, and a pea or marble sized lump/knot at the groin site which can take up to three months to resolve.  Get help right away if you notice sudden swelling at the puncture site.  Check your puncture site every day for signs of infection: fever, redness, swelling, pus drainage, warmth, foul odor or excessive pain. If this occurs, please call [phone], to speak with the RN Navigator. Get help right away if your puncture site is bleeding and the bleeding does not stop after applying firm pressure to the area.  You may continue to have skipped beats/ atrial fibrillation during the first several months after your procedure.  It is very important not to miss any doses of your blood thinner Xarelto .    You will follow up with the APP 4 weeks after your procedure.  Activity restrictions reviewed.  Patient verbalized understanding to all instructions provided.

## 2024-06-24 NOTE — Telephone Encounter (Signed)
 Attempted to reach patient to follow up with procedure completed on 06/23/24, no answer. Left VM for patient to return call.

## 2024-06-27 ENCOUNTER — Ambulatory Visit: Admitting: Physician Assistant

## 2024-06-30 ENCOUNTER — Ambulatory Visit: Admitting: Pulmonary Disease

## 2024-07-06 ENCOUNTER — Ambulatory Visit: Attending: Cardiology

## 2024-07-06 DIAGNOSIS — I5022 Chronic systolic (congestive) heart failure: Secondary | ICD-10-CM

## 2024-07-06 NOTE — Progress Notes (Signed)
 Patient seen in device clinic, no programming changes made. Confirmed w/ Dr. Almetta and agreeable device is working appropriately and no changes advised.

## 2024-07-12 ENCOUNTER — Ambulatory Visit

## 2024-07-12 DIAGNOSIS — I480 Paroxysmal atrial fibrillation: Secondary | ICD-10-CM

## 2024-07-13 LAB — CUP PACEART REMOTE DEVICE CHECK
Battery Remaining Longevity: 84 mo
Battery Remaining Percentage: 94 %
Brady Statistic RA Percent Paced: 0 %
Brady Statistic RV Percent Paced: 100 %
Date Time Interrogation Session: 20260127003000
HighPow Impedance: 71 Ohm
Implantable Lead Connection Status: 753985
Implantable Lead Connection Status: 753985
Implantable Lead Connection Status: 753985
Implantable Lead Implant Date: 20250916
Implantable Lead Implant Date: 20250916
Implantable Lead Implant Date: 20251210
Implantable Lead Location: 753858
Implantable Lead Location: 753859
Implantable Lead Location: 753860
Implantable Lead Model: 4671
Implantable Lead Model: 673
Implantable Lead Model: 7841
Implantable Lead Serial Number: 1653982
Implantable Lead Serial Number: 272899
Implantable Lead Serial Number: 901898
Implantable Pulse Generator Implant Date: 20251210
Lead Channel Impedance Value: 400 Ohm
Lead Channel Impedance Value: 558 Ohm
Lead Channel Impedance Value: 686 Ohm
Lead Channel Pacing Threshold Amplitude: 0.6 V
Lead Channel Pacing Threshold Amplitude: 1 V
Lead Channel Pacing Threshold Pulse Width: 0.4 ms
Lead Channel Pacing Threshold Pulse Width: 1 ms
Lead Channel Setting Pacing Amplitude: 2.2 V
Lead Channel Setting Pacing Amplitude: 2.5 V
Lead Channel Setting Pacing Pulse Width: 0.4 ms
Lead Channel Setting Pacing Pulse Width: 1 ms
Lead Channel Setting Sensing Sensitivity: 0.5 mV
Lead Channel Setting Sensing Sensitivity: 1 mV
Pulse Gen Serial Number: 368839
Zone Setting Status: 755011

## 2024-07-17 ENCOUNTER — Ambulatory Visit: Payer: Self-pay | Admitting: Student in an Organized Health Care Education/Training Program

## 2024-07-18 ENCOUNTER — Telehealth: Payer: Self-pay | Admitting: Student in an Organized Health Care Education/Training Program

## 2024-07-18 DIAGNOSIS — I4821 Permanent atrial fibrillation: Secondary | ICD-10-CM

## 2024-07-18 MED ORDER — RIVAROXABAN 20 MG PO TABS: 20.0000 mg | ORAL_TABLET | Freq: Every day | ORAL | 1 refills | Status: AC

## 2024-07-21 NOTE — Progress Notes (Signed)
 Remote ICD Transmission

## 2024-07-28 ENCOUNTER — Ambulatory Visit: Admitting: Physician Assistant

## 2024-08-15 ENCOUNTER — Ambulatory Visit: Admitting: Physician Assistant

## 2024-08-29 ENCOUNTER — Ambulatory Visit: Payer: Self-pay | Admitting: Physician Assistant

## 2024-09-26 ENCOUNTER — Ambulatory Visit: Admitting: Physician Assistant

## 2024-10-11 ENCOUNTER — Encounter
